# Patient Record
Sex: Male | Born: 1955 | Race: White | Hispanic: No | Marital: Single | Smoking: Former smoker
Health system: Southern US, Community
[De-identification: ages and names within clinical notes are randomized; demographics above are authoritative.]

## PROBLEM LIST (undated history)

## (undated) DIAGNOSIS — K219 Gastro-esophageal reflux disease without esophagitis: Secondary | ICD-10-CM

## (undated) DIAGNOSIS — E875 Hyperkalemia: Secondary | ICD-10-CM

## (undated) DIAGNOSIS — R911 Solitary pulmonary nodule: Secondary | ICD-10-CM

## (undated) DIAGNOSIS — J189 Pneumonia, unspecified organism: Secondary | ICD-10-CM

## (undated) DIAGNOSIS — I714 Abdominal aortic aneurysm, without rupture, unspecified: Secondary | ICD-10-CM

## (undated) DIAGNOSIS — I639 Cerebral infarction, unspecified: Secondary | ICD-10-CM

## (undated) DIAGNOSIS — M1990 Unspecified osteoarthritis, unspecified site: Secondary | ICD-10-CM

## (undated) DIAGNOSIS — K859 Acute pancreatitis without necrosis or infection, unspecified: Secondary | ICD-10-CM

## (undated) DIAGNOSIS — I472 Ventricular tachycardia: Secondary | ICD-10-CM

## (undated) DIAGNOSIS — J449 Chronic obstructive pulmonary disease, unspecified: Secondary | ICD-10-CM

## (undated) DIAGNOSIS — Z720 Tobacco use: Secondary | ICD-10-CM

## (undated) DIAGNOSIS — I5042 Chronic combined systolic (congestive) and diastolic (congestive) heart failure: Secondary | ICD-10-CM

## (undated) DIAGNOSIS — I739 Peripheral vascular disease, unspecified: Secondary | ICD-10-CM

## (undated) DIAGNOSIS — M069 Rheumatoid arthritis, unspecified: Secondary | ICD-10-CM

## (undated) DIAGNOSIS — I4729 Other ventricular tachycardia: Secondary | ICD-10-CM

## (undated) DIAGNOSIS — I2510 Atherosclerotic heart disease of native coronary artery without angina pectoris: Secondary | ICD-10-CM

## (undated) DIAGNOSIS — I219 Acute myocardial infarction, unspecified: Secondary | ICD-10-CM

## (undated) DIAGNOSIS — D649 Anemia, unspecified: Secondary | ICD-10-CM

## (undated) DIAGNOSIS — J849 Interstitial pulmonary disease, unspecified: Secondary | ICD-10-CM

## (undated) DIAGNOSIS — I779 Disorder of arteries and arterioles, unspecified: Secondary | ICD-10-CM

## (undated) DIAGNOSIS — N179 Acute kidney failure, unspecified: Secondary | ICD-10-CM

## (undated) HISTORY — DX: Atherosclerotic heart disease of native coronary artery without angina pectoris: I25.10

## (undated) HISTORY — DX: Chronic combined systolic (congestive) and diastolic (congestive) heart failure: I50.42

## (undated) HISTORY — DX: Gastro-esophageal reflux disease without esophagitis: K21.9

## (undated) HISTORY — DX: Acute pancreatitis without necrosis or infection, unspecified: K85.90

## (undated) HISTORY — DX: Abdominal aortic aneurysm, without rupture: I71.4

## (undated) HISTORY — DX: Ventricular tachycardia: I47.2

## (undated) HISTORY — DX: Interstitial pulmonary disease, unspecified: J84.9

## (undated) HISTORY — PX: OTHER SURGICAL HISTORY: SHX169

## (undated) HISTORY — DX: Hyperkalemia: E87.5

## (undated) HISTORY — PX: CAROTID ENDARTERECTOMY: SUR193

## (undated) HISTORY — DX: Rheumatoid arthritis, unspecified: M06.9

## (undated) HISTORY — DX: Solitary pulmonary nodule: R91.1

## (undated) HISTORY — DX: Other ventricular tachycardia: I47.29

## (undated) HISTORY — DX: Peripheral vascular disease, unspecified: I73.9

## (undated) HISTORY — DX: Acute kidney failure, unspecified: N17.9

## (undated) HISTORY — DX: Tobacco use: Z72.0

## (undated) HISTORY — DX: Unspecified osteoarthritis, unspecified site: M19.90

## (undated) HISTORY — DX: Disorder of arteries and arterioles, unspecified: I77.9

## (undated) HISTORY — PX: CARDIAC CATHETERIZATION: SHX172

## (undated) HISTORY — DX: Abdominal aortic aneurysm, without rupture, unspecified: I71.40

## (undated) HISTORY — DX: Anemia, unspecified: D64.9

---

## 1992-10-02 HISTORY — PX: OTHER SURGICAL HISTORY: SHX169

## 2000-09-03 ENCOUNTER — Encounter: Payer: Self-pay | Admitting: Cardiology

## 2000-09-03 ENCOUNTER — Encounter: Admission: RE | Admit: 2000-09-03 | Discharge: 2000-09-03 | Payer: Self-pay | Admitting: Cardiology

## 2002-02-03 ENCOUNTER — Encounter: Payer: Self-pay | Admitting: Emergency Medicine

## 2002-02-03 ENCOUNTER — Emergency Department (HOSPITAL_COMMUNITY): Admission: EM | Admit: 2002-02-03 | Discharge: 2002-02-03 | Payer: Self-pay | Admitting: Emergency Medicine

## 2004-05-27 ENCOUNTER — Encounter: Admission: RE | Admit: 2004-05-27 | Discharge: 2004-05-27 | Payer: Self-pay | Admitting: Cardiology

## 2004-05-29 ENCOUNTER — Ambulatory Visit (HOSPITAL_COMMUNITY): Admission: RE | Admit: 2004-05-29 | Discharge: 2004-05-29 | Payer: Self-pay | Admitting: Cardiology

## 2017-02-09 ENCOUNTER — Emergency Department (HOSPITAL_COMMUNITY): Payer: Medicaid Other

## 2017-02-09 ENCOUNTER — Emergency Department (HOSPITAL_COMMUNITY)
Admission: EM | Admit: 2017-02-09 | Discharge: 2017-02-09 | Disposition: A | Discharge status: Home / Self Care | Payer: Medicaid Other | Attending: Emergency Medicine | Admitting: Emergency Medicine

## 2017-02-09 ENCOUNTER — Emergency Department (HOSPITAL_BASED_OUTPATIENT_CLINIC_OR_DEPARTMENT_OTHER): Admit: 2017-02-09 | Discharge: 2017-02-09 | Disposition: A | Discharge status: Home / Self Care | Payer: Medicaid Other

## 2017-02-09 ENCOUNTER — Encounter (HOSPITAL_COMMUNITY): Payer: Self-pay

## 2017-02-09 DIAGNOSIS — J189 Pneumonia, unspecified organism: Secondary | ICD-10-CM | POA: Diagnosis not present

## 2017-02-09 DIAGNOSIS — I252 Old myocardial infarction: Secondary | ICD-10-CM | POA: Diagnosis not present

## 2017-02-09 DIAGNOSIS — Z7982 Long term (current) use of aspirin: Secondary | ICD-10-CM | POA: Diagnosis not present

## 2017-02-09 DIAGNOSIS — F17210 Nicotine dependence, cigarettes, uncomplicated: Secondary | ICD-10-CM | POA: Insufficient documentation

## 2017-02-09 DIAGNOSIS — M79609 Pain in unspecified limb: Secondary | ICD-10-CM

## 2017-02-09 DIAGNOSIS — M7989 Other specified soft tissue disorders: Secondary | ICD-10-CM

## 2017-02-09 DIAGNOSIS — Z8673 Personal history of transient ischemic attack (TIA), and cerebral infarction without residual deficits: Secondary | ICD-10-CM | POA: Insufficient documentation

## 2017-02-09 DIAGNOSIS — R5383 Other fatigue: Secondary | ICD-10-CM | POA: Diagnosis present

## 2017-02-09 HISTORY — DX: Acute myocardial infarction, unspecified: I21.9

## 2017-02-09 HISTORY — DX: Cerebral infarction, unspecified: I63.9

## 2017-02-09 LAB — CBC WITH DIFFERENTIAL/PLATELET
BASOS PCT: 0 %
Basophils Absolute: 0 10*3/uL (ref 0.0–0.1)
EOS PCT: 7 %
Eosinophils Absolute: 0.6 10*3/uL (ref 0.0–0.7)
HEMATOCRIT: 35.8 % — AB (ref 39.0–52.0)
Hemoglobin: 11.5 g/dL — ABNORMAL LOW (ref 13.0–17.0)
LYMPHS PCT: 27 %
Lymphs Abs: 2.3 10*3/uL (ref 0.7–4.0)
MCH: 26.5 pg (ref 26.0–34.0)
MCHC: 32.1 g/dL (ref 30.0–36.0)
MCV: 82.5 fL (ref 78.0–100.0)
MONO ABS: 0.5 10*3/uL (ref 0.1–1.0)
MONOS PCT: 6 %
NEUTROS ABS: 5.1 10*3/uL (ref 1.7–7.7)
Neutrophils Relative %: 60 %
PLATELETS: 270 10*3/uL (ref 150–400)
RBC: 4.34 MIL/uL (ref 4.22–5.81)
RDW: 16.7 % — AB (ref 11.5–15.5)
WBC: 8.5 10*3/uL (ref 4.0–10.5)

## 2017-02-09 LAB — COMPREHENSIVE METABOLIC PANEL
ALBUMIN: 3.1 g/dL — AB (ref 3.5–5.0)
ALK PHOS: 114 U/L (ref 38–126)
ALT: 10 U/L — AB (ref 17–63)
ANION GAP: 8 (ref 5–15)
AST: 16 U/L (ref 15–41)
BUN: 9 mg/dL (ref 6–20)
CALCIUM: 9.3 mg/dL (ref 8.9–10.3)
CO2: 25 mmol/L (ref 22–32)
Chloride: 105 mmol/L (ref 101–111)
Creatinine, Ser: 0.88 mg/dL (ref 0.61–1.24)
GFR calc Af Amer: >60 mL/min (ref >60)
GFR calc non Af Amer: >60 mL/min (ref >60)
GLUCOSE: 107 mg/dL — AB (ref 65–99)
Potassium: 4.4 mmol/L (ref 3.5–5.1)
SODIUM: 138 mmol/L (ref 135–145)
Total Bilirubin: 0.6 mg/dL (ref 0.3–1.2)
Total Protein: 7.6 g/dL (ref 6.5–8.1)

## 2017-02-09 LAB — I-STAT CG4 LACTIC ACID, ED: Lactic Acid, Venous: 1.35 mmol/L (ref 0.5–1.9)

## 2017-02-09 MED ORDER — DOXYCYCLINE HYCLATE 100 MG PO CAPS: 100.0000 mg | ORAL_CAPSULE | Freq: Two times a day (BID) | ORAL | 0 refills | Status: DC

## 2017-02-09 MED ORDER — TRAMADOL HCL 50 MG PO TABS: 50.0000 mg | ORAL_TABLET | Freq: Four times a day (QID) | ORAL | 0 refills | Status: DC | PRN

## 2017-02-09 NOTE — Progress Notes (Signed)
*  Preliminary Results* Left lower extremity venous duplex completed. Left lower extremity is negative for deep vein thrombosis. There is no evidence of left Baker's cyst. There is a heterogenous area of the left groin, suggestive of possible enlarged inguinal lymph node. There is a large hypoechoic area that extends from the distal popliteal fossa through to the mid calf; etiology is unknown. Further testing may be warranted if clinically indicated.  02/09/2017 2:45 PM  Gertie Fey, BS, RVT, RDCS, RDMS

## 2017-02-09 NOTE — Discharge Instructions (Signed)
Return here as needed.  Follow-up with the clinic provided.  °

## 2017-02-09 NOTE — ED Provider Notes (Signed)
[facility]PT Provider Note   CSN: [id number] Arrival date & time: 02/09/17  1011     History   Chief Complaint Chief Complaint  Patient presents with  . Generalized Body Aches    HPI LYFE MONGER is a 61 y.o. male.  HPI Patient presents to the emergency department with pain and both shoulders and pain in both knees has been ongoing since January.  The patient states that he has also had some cough as well.  Patient states that nothing seems make the condition better or worse.  He states he did not take any new medications prior to arrival other than the ones prescribed.  Patient states that he is due to have open heart surgery for several blocked vessels.  Patient states he is not having any chest pain or shortness of breath. The patient denies chest pain, shortness of breath, headache,blurred vision, neck pain, fever, cough, weakness, numbness, dizziness, anorexia, edema, abdominal pain, nausea, vomiting, diarrhea, rash,  dysuria, hematemesis, bloody stool, near syncope, or syncope. Past Medical History:  Diagnosis Date  . MI (myocardial infarction)    x 3   . Stroke [facility])     There are no active problems to display for this patient.   Past Surgical History:  Procedure Laterality Date  . CARDIAC CATHETERIZATION         Home Medications    Prior to Admission medications   Medication Sig Start Date End Date Taking? Authorizing Provider  acetaminophen (TYLENOL) 325 MG tablet Take 650 mg by mouth every 6 (six) hours as needed for mild pain.   Yes Historical Provider, MD  aspirin 81 MG chewable tablet Chew 81 mg by mouth daily.   Yes Historical Provider, MD  ibuprofen (ADVIL,MOTRIN) 200 MG tablet Take 200 mg by mouth every 6 (six) hours as needed for moderate pain.   Yes Historical Provider, MD  metoprolol tartrate (LOPRESSOR) 25 MG tablet Take 25 mg by mouth 2 (two) times daily.   Yes Historical Provider, MD    Family History No family history on  file.  Social History Social History  Substance Use Topics  . Smoking status: Current Some Day Smoker    Packs/day: 0.50    Types: Cigarettes  . Smokeless tobacco: Never Used  . Alcohol use No     Allergies   Patient has no known allergies.   Review of Systems Review of Systems All other systems negative except as documented in the HPI. All pertinent positives and negatives as reviewed in the HPI.   Physical Exam Updated Vital Signs BP (!) 144/84   Pulse 73   Temp 97.4 F (36.3 C) (Oral)   Resp 18   Ht 6\' 1"  (1.854 m)   Wt 75.8 kg   SpO2 99%   BMI 22.03 kg/m   Physical Exam  Constitutional: He is oriented to person, place, and time. He appears well-developed and well-nourished. No distress.  HENT:  Head: Normocephalic and atraumatic.  Mouth/Throat: Oropharynx is clear and moist.  Eyes: Pupils are equal, round, and reactive to light.  Neck: Normal range of motion. Neck supple.  Cardiovascular: Normal rate, regular rhythm and normal heart sounds.  Exam reveals no gallop and no friction rub.   No murmur heard. Pulmonary/Chest: Effort normal and breath sounds normal. No respiratory distress. He has no wheezes.  Abdominal: Soft. Bowel sounds are normal. He exhibits no distension. There is no tenderness.  Neurological: He is alert and oriented to person, place, and time. He  exhibits normal muscle tone. Coordination normal.  Skin: Skin is warm and dry. Capillary refill takes less than 2 seconds. No rash noted. No erythema.  Psychiatric: He has a normal mood and affect. His behavior is normal.  Nursing note and vitals reviewed.    ED Treatments / Results  Labs (all labs ordered are listed, but only abnormal results are displayed) Labs Reviewed  COMPREHENSIVE METABOLIC PANEL - Abnormal; Notable for the following:       Result Value   Glucose, Bld 107 (*)    Albumin 3.1 (*)    ALT 10 (*)    All other components within normal limits  CBC WITH DIFFERENTIAL/PLATELET  - Abnormal; Notable for the following:    Hemoglobin 11.5 (*)    HCT 35.8 (*)    RDW 16.7 (*)    All other components within normal limits  I-STAT CG4 LACTIC ACID, ED  I-STAT CG4 LACTIC ACID, ED    EKG  EKG Interpretation None       Radiology Dg Chest 2 View  Result Date: 02/09/2017 CLINICAL DATA:  61 year old male with cough and shortness of breath for the past month. Hypertension. Post CABG. Smoker. Initial encounter. EXAM: CHEST  2 VIEW COMPARISON:  05/27/2004. FINDINGS: Significant change since prior examination. Diffuse increased lung markings, possibly chronic although not able to exclude superimposed mild pulmonary vascular congestion. Given the significant change, lack of more recent films and patient's history smoking, chest CT with high-resolution imaging may be considered for further delineation. Fracture of the left first through fifth ribs new from prior exam although appearing chronic. No pneumothorax. Post CABG with fractured sternal wires.  Cardiomegaly. IMPRESSION: Significant change since prior examination. Diffuse increased lung markings, possibly chronic although not able to exclude superimposed mild pulmonary vascular congestion. Given the significant change, lack of more recent films and patient's history of smoking, chest CT with high-resolution imaging may be considered for further delineation. Fracture of the left first through fifth ribs new from prior exam although appearing chronic. Post CABG Cardiomegaly. Electronically Signed   By: Lacy Duverney M.D.   On: 02/09/2017 11:35    Procedures Procedures (including critical care time)  Medications Ordered in ED Medications - No data to display   Initial Impression / Assessment and Plan / ED Course  I have reviewed the triage vital signs and the nursing notes.  Pertinent labs & imaging results that were available during my care of the patient were reviewed by me and considered in my medical decision making (see  chart for details).     Patient is found to have what appears to be possible pneumonia.  I will treat with antibiotics.  He is also advised follow-up with his primary care doctor and given referral from care management for an appointment on the 17th.  Patient is advised that if he has any worsening in his condition, to return to the emergency department.  He agrees the plan and all questions were answered  Final Clinical Impressions(s) / ED Diagnoses   Final diagnoses:  None    New Prescriptions New Prescriptions   No medications on file     Charlestine Night, PA-C 02/13/17 0105    Canary Brim Tegeler, MD 02/16/17 [phone]

## 2017-02-09 NOTE — ED Notes (Signed)
Patient transported to Ultrasound 

## 2017-02-09 NOTE — ED Notes (Signed)
Pt stable, understands discharge instructions, and reasons for return.   

## 2017-02-09 NOTE — ED Triage Notes (Addendum)
Per Pt, Pt is coming from home with complaints of bilateral should and bilateral knee pain that has been intermittent for a couple months. Pt had three heart attacks and a stroke in January. Pt reports productive cough

## 2017-02-16 ENCOUNTER — Inpatient Hospital Stay (INDEPENDENT_AMBULATORY_CARE_PROVIDER_SITE_OTHER): Payer: Self-pay | Admitting: Physician Assistant

## 2017-02-19 ENCOUNTER — Encounter (HOSPITAL_COMMUNITY): Payer: Self-pay | Admitting: *Deleted

## 2017-02-19 ENCOUNTER — Emergency Department (HOSPITAL_COMMUNITY)
Admission: EM | Admit: 2017-02-19 | Discharge: 2017-02-19 | Disposition: A | Discharge status: Home / Self Care | Payer: Medicaid Other | Attending: Emergency Medicine | Admitting: Emergency Medicine

## 2017-02-19 ENCOUNTER — Emergency Department (HOSPITAL_COMMUNITY): Payer: Medicaid Other

## 2017-02-19 DIAGNOSIS — F17210 Nicotine dependence, cigarettes, uncomplicated: Secondary | ICD-10-CM | POA: Insufficient documentation

## 2017-02-19 DIAGNOSIS — Z720 Tobacco use: Secondary | ICD-10-CM

## 2017-02-19 DIAGNOSIS — R05 Cough: Secondary | ICD-10-CM | POA: Diagnosis present

## 2017-02-19 DIAGNOSIS — Z8673 Personal history of transient ischemic attack (TIA), and cerebral infarction without residual deficits: Secondary | ICD-10-CM | POA: Diagnosis not present

## 2017-02-19 DIAGNOSIS — I252 Old myocardial infarction: Secondary | ICD-10-CM | POA: Insufficient documentation

## 2017-02-19 DIAGNOSIS — J40 Bronchitis, not specified as acute or chronic: Secondary | ICD-10-CM | POA: Diagnosis not present

## 2017-02-19 DIAGNOSIS — Z7982 Long term (current) use of aspirin: Secondary | ICD-10-CM | POA: Insufficient documentation

## 2017-02-19 DIAGNOSIS — Z79899 Other long term (current) drug therapy: Secondary | ICD-10-CM | POA: Diagnosis not present

## 2017-02-19 LAB — CBC WITH DIFFERENTIAL/PLATELET
BASOS PCT: 0 %
Basophils Absolute: 0 10*3/uL (ref 0.0–0.1)
EOS ABS: 0.4 10*3/uL (ref 0.0–0.7)
EOS PCT: 7 %
HCT: 32.9 % — ABNORMAL LOW (ref 39.0–52.0)
Hemoglobin: 10.6 g/dL — ABNORMAL LOW (ref 13.0–17.0)
LYMPHS ABS: 2.1 10*3/uL (ref 0.7–4.0)
Lymphocytes Relative: 34 %
MCH: 26.2 pg (ref 26.0–34.0)
MCHC: 32.2 g/dL (ref 30.0–36.0)
MCV: 81.4 fL (ref 78.0–100.0)
MONOS PCT: 6 %
Monocytes Absolute: 0.4 10*3/uL (ref 0.1–1.0)
Neutro Abs: 3.2 10*3/uL (ref 1.7–7.7)
Neutrophils Relative %: 53 %
PLATELETS: 272 10*3/uL (ref 150–400)
RBC: 4.04 MIL/uL — ABNORMAL LOW (ref 4.22–5.81)
RDW: 16.5 % — AB (ref 11.5–15.5)
WBC: 6 10*3/uL (ref 4.0–10.5)

## 2017-02-19 LAB — URINALYSIS, ROUTINE W REFLEX MICROSCOPIC
BILIRUBIN URINE: NEGATIVE
Glucose, UA: NEGATIVE mg/dL
HGB URINE DIPSTICK: NEGATIVE
Ketones, ur: NEGATIVE mg/dL
Leukocytes, UA: NEGATIVE
NITRITE: NEGATIVE
Protein, ur: NEGATIVE mg/dL
SPECIFIC GRAVITY, URINE: 1.011 (ref 1.005–1.030)
pH: 5 (ref 5.0–8.0)

## 2017-02-19 LAB — COMPREHENSIVE METABOLIC PANEL
ALK PHOS: 111 U/L (ref 38–126)
ALT: 9 U/L — AB (ref 17–63)
AST: 21 U/L (ref 15–41)
Albumin: 3.3 g/dL — ABNORMAL LOW (ref 3.5–5.0)
Anion gap: 8 (ref 5–15)
BUN: 6 mg/dL (ref 6–20)
CALCIUM: 9.1 mg/dL (ref 8.9–10.3)
CHLORIDE: 107 mmol/L (ref 101–111)
CO2: 23 mmol/L (ref 22–32)
CREATININE: 0.92 mg/dL (ref 0.61–1.24)
GFR calc non Af Amer: >60 mL/min (ref >60)
Glucose, Bld: 91 mg/dL (ref 65–99)
Potassium: 4 mmol/L (ref 3.5–5.1)
SODIUM: 138 mmol/L (ref 135–145)
Total Bilirubin: 0.7 mg/dL (ref 0.3–1.2)
Total Protein: 8.3 g/dL — ABNORMAL HIGH (ref 6.5–8.1)

## 2017-02-19 LAB — I-STAT CG4 LACTIC ACID, ED: LACTIC ACID, VENOUS: 1.61 mmol/L (ref 0.5–1.9)

## 2017-02-19 MED ORDER — AEROCHAMBER Z-STAT PLUS/MEDIUM MISC
1.0000 | Freq: Once | Status: AC
  Administered 2017-02-19: 1
  Filled 2017-02-19: qty 1

## 2017-02-19 MED ORDER — PREDNISONE 20 MG PO TABS
60.0000 mg | ORAL_TABLET | Freq: Once | ORAL | Status: AC
  Administered 2017-02-19: 60 mg via ORAL

## 2017-02-19 MED ORDER — PREDNISONE 10 MG PO TABS: 20.0000 mg | ORAL_TABLET | Freq: Two times a day (BID) | ORAL | 0 refills | Status: DC

## 2017-02-19 MED ORDER — OXYCODONE-ACETAMINOPHEN 5-325 MG PO TABS
ORAL_TABLET | ORAL | Status: AC
  Administered 2017-02-19: 1 via ORAL
  Filled 2017-02-19: qty 1

## 2017-02-19 MED ORDER — ALBUTEROL SULFATE HFA 108 (90 BASE) MCG/ACT IN AERS
2.0000 | INHALATION_SPRAY | RESPIRATORY_TRACT | Status: DC | PRN
  Administered 2017-02-19: 2 via RESPIRATORY_TRACT
  Filled 2017-02-19: qty 6.7

## 2017-02-19 MED ORDER — OXYCODONE-ACETAMINOPHEN 5-325 MG PO TABS
1.0000 | ORAL_TABLET | Freq: Once | ORAL | Status: AC
  Administered 2017-02-19: 1 via ORAL

## 2017-02-19 NOTE — ED Notes (Signed)
Pt states, "I am trying to leave. I am hurting and can hurt at home." this RN encouraged the pt and family to stay for further eval, pt cooperative, this RN assessed pain & will communicate with the triage RN to address pts pain score

## 2017-02-19 NOTE — ED Notes (Signed)
Pt verbalized understanding of d/c instructions and has no further questions. Pt stable and NAD. VSS.  

## 2017-02-19 NOTE — Discharge Instructions (Signed)
Use the inhaler 2 puffs every 3-4 hours as needed for cough or trouble breathing.  Additionally for cough, use Robitussin-DM.  Try to avoid smoking.  Get plenty of rest and drink a lot of fluids.  Follow-up with a primary care doctor for ongoing management as soon as possible.

## 2017-02-19 NOTE — ED Triage Notes (Signed)
Pt states that he was seen 1 week ago  And told that he had pneumonia. Pt was given antibiotics but was unable to fill. Pt woke this morning with swelling to his left upper back and rt elbow. Decreased lung sounds on left side. Tenderness to palpation over swelling in back.

## 2017-02-19 NOTE — ED Notes (Signed)
Pt updated on wait time, pt encouraged to wait for room

## 2017-02-19 NOTE — ED Provider Notes (Signed)
[facility] Provider Note   CSN: [id number] Arrival date & time: 02/19/17  1251     History   Chief Complaint Chief Complaint  Patient presents with  . Cough    HPI [name] is a 61 y.o. male.  He presents for evaluation of persistent cough, despite taking 10 days of Augmentin for pneumonia.  He has mild shortness of breath which is worse with exertion.  He denies fever, chills, weakness or dizziness.  He continues to smoke cigarettes but is trying to cut back.  He does not currently have a primary care provider.  There are no other known modifying factors.  HPI  Past Medical History:  Diagnosis Date  . MI (myocardial infarction) (HCC)    x 3   . Stroke [facility])     There are no active problems to display for this patient.   Past Surgical History:  Procedure Laterality Date  . CARDIAC CATHETERIZATION         Home Medications    Prior to Admission medications   Medication Sig Start Date End Date Taking? Authorizing Provider  acetaminophen (TYLENOL) 325 MG tablet Take 650 mg by mouth every 6 (six) hours as needed for mild pain.    Historical Provider, MD  aspirin 81 MG chewable tablet Chew 81 mg by mouth daily.    Historical Provider, MD  doxycycline (VIBRAMYCIN) 100 MG capsule Take 1 capsule (100 mg total) by mouth 2 (two) times daily. 02/09/17   Charlestine Night, PA-C  ibuprofen (ADVIL,MOTRIN) 200 MG tablet Take 200 mg by mouth every 6 (six) hours as needed for moderate pain.    Historical Provider, MD  metoprolol tartrate (LOPRESSOR) 25 MG tablet Take 25 mg by mouth 2 (two) times daily.    Historical Provider, MD  predniSONE (DELTASONE) 10 MG tablet Take 2 tablets (20 mg total) by mouth 2 (two) times daily. 02/19/17   Mancel Bale, MD  traMADol (ULTRAM) 50 MG tablet Take 1 tablet (50 mg total) by mouth every 6 (six) hours as needed for severe pain. 02/09/17   Charlestine Night, PA-C    Family History No family history on file.  Social  History Social History  Substance Use Topics  . Smoking status: Current Some Day Smoker    Packs/day: 0.50    Types: Cigarettes  . Smokeless tobacco: Never Used  . Alcohol use No     Allergies   Patient has no known allergies.   Review of Systems Review of Systems  All other systems reviewed and are negative.    Physical Exam Updated Vital Signs BP 109/65 (BP Location: Right Arm)   Pulse (!) 134   Temp 97.6 F (36.4 C) (Oral)   Resp 20   SpO2 91%   Physical Exam  Constitutional: He is oriented to person, place, and time. He appears well-developed and well-nourished.  HENT:  Head: Normocephalic and atraumatic.  Right Ear: External ear normal.  Left Ear: External ear normal.  Eyes: Conjunctivae and EOM are normal. Pupils are equal, round, and reactive to light.  Neck: Normal range of motion and phonation normal. Neck supple.  Cardiovascular: Normal rate, regular rhythm and normal heart sounds.   Pulmonary/Chest: Effort normal. He exhibits no bony tenderness.  Few scattered rhonchi.  No increased work of breathing  Abdominal: Soft. There is no tenderness.  Musculoskeletal: Normal range of motion.  Neurological: He is alert and oriented to person, place, and time. No cranial nerve deficit or sensory deficit. He exhibits  normal muscle tone. Coordination normal.  Skin: Skin is warm, dry and intact.  Psychiatric: He has a normal mood and affect. His behavior is normal. Judgment and thought content normal.  Nursing note and vitals reviewed.    ED Treatments / Results  Labs (all labs ordered are listed, but only abnormal results are displayed) Labs Reviewed  COMPREHENSIVE METABOLIC PANEL - Abnormal; Notable for the following:       Result Value   Total Protein 8.3 (*)    Albumin 3.3 (*)    ALT 9 (*)    All other components within normal limits  CBC WITH DIFFERENTIAL/PLATELET - Abnormal; Notable for the following:    RBC 4.04 (*)    Hemoglobin 10.6 (*)    HCT 32.9  (*)    RDW 16.5 (*)    All other components within normal limits  URINALYSIS, ROUTINE W REFLEX MICROSCOPIC  I-STAT CG4 LACTIC ACID, ED  I-STAT CG4 LACTIC ACID, ED    EKG  EKG Interpretation None       Radiology No results found.  Procedures Procedures (including critical care time)  Medications Ordered in ED Medications  oxyCODONE-acetaminophen (PERCOCET/ROXICET) 5-325 MG per tablet 1 tablet (1 tablet Oral Given 02/19/17 1613)  aerochamber Z-Stat Plus/medium 1 each (1 each Other Given 02/19/17 1848)  predniSONE (DELTASONE) tablet 60 mg (60 mg Oral Given 02/19/17 1848)     Initial Impression / Assessment and Plan / ED Course  I have reviewed the triage vital signs and the nursing notes.  Pertinent labs & imaging results that were available during my care of the patient were reviewed by me and considered in my medical decision making (see chart for details).     Medications  oxyCODONE-acetaminophen (PERCOCET/ROXICET) 5-325 MG per tablet 1 tablet (1 tablet Oral Given 02/19/17 1613)  aerochamber Z-Stat Plus/medium 1 each (1 each Other Given 02/19/17 1848)  predniSONE (DELTASONE) tablet 60 mg (60 mg Oral Given 02/19/17 1848)    No data found.   At D/C Reevaluation with update and discussion. After initial assessment and treatment, an updated evaluation reveals he is comfortable and has no c/o. Discussed findings and answered questions. Ura Yingling L   Final Clinical Impressions(s) / ED Diagnoses   Final diagnoses:  Bronchitis  Tobacco abuse   Bronchitis without PNE, or SBI. Doubt metabolic instability.   Nursing Notes Reviewed/ Care Coordinated Applicable Imaging Reviewed Interpretation of Laboratory Data incorporated into ED treatment  The patient appears reasonably screened and/or stabilized for discharge and I doubt any other medical condition or other [facility] requiring further screening, evaluation, or treatment in the ED at this time prior to discharge.  Plan:  Home Medications-Albuterol inhaler q 3-4 hour prn,  continue usual; Home Treatments- rest, fluids, stop smoking; return here if the recommended treatment, does not improve the symptoms; Recommended follow up- PCP prn   New Prescriptions Discharge Medication List as of 02/19/2017  6:37 PM    START taking these medications   Details  predniSONE (DELTASONE) 10 MG tablet Take 2 tablets (20 mg total) by mouth 2 (two) times daily., Starting Fri 02/19/2017, Print         Mancel Bale, MD 02/22/17 [phone]

## 2017-02-24 ENCOUNTER — Encounter (HOSPITAL_COMMUNITY): Payer: Self-pay | Admitting: Emergency Medicine

## 2017-02-24 ENCOUNTER — Other Ambulatory Visit: Payer: Self-pay

## 2017-02-24 ENCOUNTER — Inpatient Hospital Stay (HOSPITAL_COMMUNITY)
Admission: EM | Admit: 2017-02-24 | Discharge: 2017-03-02 | DRG: 286 | Disposition: A | Discharge status: Home / Self Care | Payer: Medicaid Other | Attending: Internal Medicine | Admitting: Internal Medicine

## 2017-02-24 ENCOUNTER — Encounter (INDEPENDENT_AMBULATORY_CARE_PROVIDER_SITE_OTHER): Payer: Self-pay | Admitting: Physician Assistant

## 2017-02-24 ENCOUNTER — Ambulatory Visit (HOSPITAL_BASED_OUTPATIENT_CLINIC_OR_DEPARTMENT_OTHER): Payer: Medicaid Other | Admitting: *Deleted

## 2017-02-24 ENCOUNTER — Emergency Department (HOSPITAL_COMMUNITY): Payer: Medicaid Other

## 2017-02-24 ENCOUNTER — Ambulatory Visit (INDEPENDENT_AMBULATORY_CARE_PROVIDER_SITE_OTHER): Payer: Medicaid Other | Admitting: Physician Assistant

## 2017-02-24 VITALS — BP 122/82 | HR 89 | Temp 97.4°F | Ht 71.0 in | Wt 173.0 lb

## 2017-02-24 DIAGNOSIS — I10 Essential (primary) hypertension: Secondary | ICD-10-CM | POA: Diagnosis present

## 2017-02-24 DIAGNOSIS — I110 Hypertensive heart disease with heart failure: Secondary | ICD-10-CM | POA: Diagnosis present

## 2017-02-24 DIAGNOSIS — F17210 Nicotine dependence, cigarettes, uncomplicated: Secondary | ICD-10-CM | POA: Diagnosis present

## 2017-02-24 DIAGNOSIS — I252 Old myocardial infarction: Secondary | ICD-10-CM

## 2017-02-24 DIAGNOSIS — Z7982 Long term (current) use of aspirin: Secondary | ICD-10-CM | POA: Diagnosis not present

## 2017-02-24 DIAGNOSIS — F17200 Nicotine dependence, unspecified, uncomplicated: Secondary | ICD-10-CM | POA: Diagnosis present

## 2017-02-24 DIAGNOSIS — I714 Abdominal aortic aneurysm, without rupture, unspecified: Secondary | ICD-10-CM | POA: Diagnosis present

## 2017-02-24 DIAGNOSIS — I472 Ventricular tachycardia: Secondary | ICD-10-CM | POA: Diagnosis present

## 2017-02-24 DIAGNOSIS — R9431 Abnormal electrocardiogram [ECG] [EKG]: Secondary | ICD-10-CM | POA: Diagnosis not present

## 2017-02-24 DIAGNOSIS — J9621 Acute and chronic respiratory failure with hypoxia: Secondary | ICD-10-CM | POA: Diagnosis present

## 2017-02-24 DIAGNOSIS — I25708 Atherosclerosis of coronary artery bypass graft(s), unspecified, with other forms of angina pectoris: Secondary | ICD-10-CM | POA: Diagnosis present

## 2017-02-24 DIAGNOSIS — Z79899 Other long term (current) drug therapy: Secondary | ICD-10-CM | POA: Diagnosis not present

## 2017-02-24 DIAGNOSIS — R9439 Abnormal result of other cardiovascular function study: Secondary | ICD-10-CM | POA: Diagnosis not present

## 2017-02-24 DIAGNOSIS — J849 Interstitial pulmonary disease, unspecified: Secondary | ICD-10-CM | POA: Diagnosis not present

## 2017-02-24 DIAGNOSIS — R911 Solitary pulmonary nodule: Secondary | ICD-10-CM | POA: Diagnosis present

## 2017-02-24 DIAGNOSIS — Z8679 Personal history of other diseases of the circulatory system: Secondary | ICD-10-CM | POA: Diagnosis not present

## 2017-02-24 DIAGNOSIS — D638 Anemia in other chronic diseases classified elsewhere: Secondary | ICD-10-CM | POA: Diagnosis present

## 2017-02-24 DIAGNOSIS — R0600 Dyspnea, unspecified: Secondary | ICD-10-CM

## 2017-02-24 DIAGNOSIS — R0602 Shortness of breath: Secondary | ICD-10-CM | POA: Diagnosis not present

## 2017-02-24 DIAGNOSIS — J84112 Idiopathic pulmonary fibrosis: Secondary | ICD-10-CM | POA: Diagnosis present

## 2017-02-24 DIAGNOSIS — Z720 Tobacco use: Secondary | ICD-10-CM

## 2017-02-24 DIAGNOSIS — D649 Anemia, unspecified: Secondary | ICD-10-CM | POA: Diagnosis present

## 2017-02-24 DIAGNOSIS — I5043 Acute on chronic combined systolic (congestive) and diastolic (congestive) heart failure: Secondary | ICD-10-CM | POA: Diagnosis present

## 2017-02-24 DIAGNOSIS — E7800 Pure hypercholesterolemia, unspecified: Secondary | ICD-10-CM | POA: Diagnosis present

## 2017-02-24 DIAGNOSIS — I25118 Atherosclerotic heart disease of native coronary artery with other forms of angina pectoris: Secondary | ICD-10-CM | POA: Diagnosis present

## 2017-02-24 DIAGNOSIS — Z951 Presence of aortocoronary bypass graft: Secondary | ICD-10-CM | POA: Diagnosis not present

## 2017-02-24 DIAGNOSIS — R000 Tachycardia, unspecified: Secondary | ICD-10-CM

## 2017-02-24 DIAGNOSIS — J80 Acute respiratory distress syndrome: Secondary | ICD-10-CM | POA: Diagnosis not present

## 2017-02-24 DIAGNOSIS — I2510 Atherosclerotic heart disease of native coronary artery without angina pectoris: Secondary | ICD-10-CM | POA: Diagnosis present

## 2017-02-24 DIAGNOSIS — R079 Chest pain, unspecified: Secondary | ICD-10-CM | POA: Diagnosis not present

## 2017-02-24 DIAGNOSIS — I447 Left bundle-branch block, unspecified: Secondary | ICD-10-CM | POA: Diagnosis present

## 2017-02-24 DIAGNOSIS — I5023 Acute on chronic systolic (congestive) heart failure: Secondary | ICD-10-CM | POA: Diagnosis not present

## 2017-02-24 DIAGNOSIS — R0902 Hypoxemia: Secondary | ICD-10-CM

## 2017-02-24 DIAGNOSIS — J9811 Atelectasis: Secondary | ICD-10-CM | POA: Diagnosis present

## 2017-02-24 DIAGNOSIS — J9601 Acute respiratory failure with hypoxia: Secondary | ICD-10-CM | POA: Diagnosis not present

## 2017-02-24 DIAGNOSIS — I5041 Acute combined systolic (congestive) and diastolic (congestive) heart failure: Secondary | ICD-10-CM | POA: Diagnosis not present

## 2017-02-24 DIAGNOSIS — Z8673 Personal history of transient ischemic attack (TIA), and cerebral infarction without residual deficits: Secondary | ICD-10-CM | POA: Diagnosis not present

## 2017-02-24 DIAGNOSIS — I25110 Atherosclerotic heart disease of native coronary artery with unstable angina pectoris: Secondary | ICD-10-CM | POA: Diagnosis not present

## 2017-02-24 LAB — BASIC METABOLIC PANEL
Anion gap: 8 (ref 5–15)
BUN: 8 mg/dL (ref 6–20)
CHLORIDE: 105 mmol/L (ref 101–111)
CO2: 26 mmol/L (ref 22–32)
Calcium: 9.2 mg/dL (ref 8.9–10.3)
Creatinine, Ser: 1 mg/dL (ref 0.61–1.24)
GFR calc Af Amer: >60 mL/min (ref >60)
GFR calc non Af Amer: >60 mL/min (ref >60)
GLUCOSE: 95 mg/dL (ref 65–99)
Potassium: 4.3 mmol/L (ref 3.5–5.1)
Sodium: 139 mmol/L (ref 135–145)

## 2017-02-24 LAB — CBC WITH DIFFERENTIAL/PLATELET
Basophils Absolute: 0 10*3/uL (ref 0.0–0.1)
Basophils Relative: 0 %
Eosinophils Absolute: 0.4 10*3/uL (ref 0.0–0.7)
Eosinophils Relative: 5 %
HEMATOCRIT: 31.9 % — AB (ref 39.0–52.0)
HEMOGLOBIN: 10.3 g/dL — AB (ref 13.0–17.0)
LYMPHS ABS: 2.9 10*3/uL (ref 0.7–4.0)
LYMPHS PCT: 34 %
MCH: 26.3 pg (ref 26.0–34.0)
MCHC: 32.3 g/dL (ref 30.0–36.0)
MCV: 81.6 fL (ref 78.0–100.0)
Monocytes Absolute: 0.4 10*3/uL (ref 0.1–1.0)
Monocytes Relative: 5 %
NEUTROS PCT: 56 %
Neutro Abs: 4.9 10*3/uL (ref 1.7–7.7)
Platelets: 335 10*3/uL (ref 150–400)
RBC: 3.91 MIL/uL — AB (ref 4.22–5.81)
RDW: 16.9 % — ABNORMAL HIGH (ref 11.5–15.5)
WBC: 8.6 10*3/uL (ref 4.0–10.5)

## 2017-02-24 LAB — I-STAT TROPONIN, ED
TROPONIN I, POC: 0 ng/mL (ref 0.00–0.08)
Troponin i, poc: 0.02 ng/mL (ref 0.00–0.08)

## 2017-02-24 MED ORDER — SODIUM CHLORIDE 0.9 % IV BOLUS (SEPSIS): 500.0000 mL | Freq: Once | INTRAVENOUS | Status: DC

## 2017-02-24 MED ORDER — IPRATROPIUM BROMIDE 0.02 % IN SOLN
0.5000 mg | Freq: Once | RESPIRATORY_TRACT | Status: AC
  Administered 2017-02-24: 0.5 mg via RESPIRATORY_TRACT
  Filled 2017-02-24: qty 2.5

## 2017-02-24 MED ORDER — METHYLPREDNISOLONE SODIUM SUCC 125 MG IJ SOLR
125.0000 mg | Freq: Once | INTRAMUSCULAR | Status: AC
  Administered 2017-02-24: 125 mg via INTRAVENOUS
  Filled 2017-02-24: qty 2

## 2017-02-24 MED ORDER — HYDROCODONE-ACETAMINOPHEN 5-325 MG PO TABS
1.0000 | ORAL_TABLET | Freq: Once | ORAL | Status: AC
  Administered 2017-02-24: 1 via ORAL
  Filled 2017-02-24: qty 1

## 2017-02-24 MED ORDER — POLYETHYLENE GLYCOL 3350 17 G PO PACK: 17.0000 g | PACK | Freq: Every day | ORAL | Status: DC | PRN

## 2017-02-24 MED ORDER — SODIUM CHLORIDE 0.9% FLUSH
3.0000 mL | Freq: Two times a day (BID) | INTRAVENOUS | Status: DC
  Administered 2017-02-25 (×2): 3 mL via INTRAVENOUS

## 2017-02-24 MED ORDER — BISACODYL 5 MG PO TBEC
5.0000 mg | DELAYED_RELEASE_TABLET | Freq: Every day | ORAL | Status: DC | PRN
  Filled 2017-02-24: qty 1

## 2017-02-24 MED ORDER — ALBUTEROL SULFATE (2.5 MG/3ML) 0.083% IN NEBU
5.0000 mg | INHALATION_SOLUTION | Freq: Once | RESPIRATORY_TRACT | Status: AC
  Administered 2017-02-24: 5 mg via RESPIRATORY_TRACT
  Filled 2017-02-24: qty 6

## 2017-02-24 MED ORDER — HYDROCODONE-ACETAMINOPHEN 5-325 MG PO TABS
1.0000 | ORAL_TABLET | ORAL | Status: DC | PRN
  Administered 2017-02-25: 1 via ORAL
  Administered 2017-02-26: 2 via ORAL
  Administered 2017-02-27: 1 via ORAL
  Administered 2017-02-27 – 2017-02-28 (×3): 2 via ORAL
  Filled 2017-02-24 (×4): qty 2
  Filled 2017-02-24: qty 1
  Filled 2017-02-24: qty 2

## 2017-02-24 MED ORDER — ACETAMINOPHEN 325 MG PO TABS: 650.0000 mg | ORAL_TABLET | Freq: Four times a day (QID) | ORAL | Status: DC | PRN

## 2017-02-24 MED ORDER — IBUPROFEN 400 MG PO TABS: 400.0000 mg | ORAL_TABLET | Freq: Once | ORAL | Status: DC

## 2017-02-24 MED ORDER — ONDANSETRON HCL 4 MG/2ML IJ SOLN
4.0000 mg | Freq: Four times a day (QID) | INTRAMUSCULAR | Status: DC | PRN
Start: 2017-02-24 — End: 2017-03-02

## 2017-02-24 MED ORDER — ENOXAPARIN SODIUM 40 MG/0.4ML ~~LOC~~ SOLN
40.0000 mg | SUBCUTANEOUS | Status: DC
  Administered 2017-02-25 – 2017-02-28 (×4): 40 mg via SUBCUTANEOUS
  Filled 2017-02-24 (×4): qty 0.4

## 2017-02-24 MED ORDER — SODIUM CHLORIDE 0.9% FLUSH: 3.0000 mL | INTRAVENOUS | Status: DC | PRN

## 2017-02-24 MED ORDER — SODIUM CHLORIDE 0.9 % IV SOLN: 250.0000 mL | INTRAVENOUS | Status: DC | PRN

## 2017-02-24 MED ORDER — IPRATROPIUM-ALBUTEROL 0.5-2.5 (3) MG/3ML IN SOLN: 3.0000 mL | RESPIRATORY_TRACT | Status: DC | PRN

## 2017-02-24 MED ORDER — METHYLPREDNISOLONE SODIUM SUCC 125 MG IJ SOLR
60.0000 mg | Freq: Three times a day (TID) | INTRAMUSCULAR | Status: DC
  Administered 2017-02-25: 60 mg via INTRAVENOUS
  Filled 2017-02-24: qty 2

## 2017-02-24 MED ORDER — SODIUM CHLORIDE 0.9% FLUSH
3.0000 mL | Freq: Two times a day (BID) | INTRAVENOUS | Status: DC
  Administered 2017-02-26 – 2017-03-01 (×7): 3 mL via INTRAVENOUS

## 2017-02-24 MED ORDER — ONDANSETRON HCL 4 MG PO TABS: 4.0000 mg | ORAL_TABLET | Freq: Four times a day (QID) | ORAL | Status: DC | PRN

## 2017-02-24 MED ORDER — ACETAMINOPHEN 650 MG RE SUPP: 650.0000 mg | Freq: Four times a day (QID) | RECTAL | Status: DC | PRN

## 2017-02-24 NOTE — Progress Notes (Signed)
Pt arrived for EKG per Sindy Messing

## 2017-02-24 NOTE — ED Triage Notes (Signed)
Pt states he visit his PCP for follow up after dc from hospital and PCP encouraged him to follow up in abnormal EKG, pt denies any pain or SOB at this time.

## 2017-02-24 NOTE — Progress Notes (Signed)
Subjective:  Patient ID: [name], male    DOB: [birth year]  Age: 61 y.o. MRN: [id number]  CC: ED f/u for bronchitis  HPI [name] is a 61 y.o. male with a PMH of MI x3, quadruple bypass surgery 1993, Carotid CEA or CAS?, Stroke, abdominal aneurysm, and tobacco abuse. Was in the ED on 02/09/17, diagnosis CAP, and on 02/19/17 diagnosis Bronchitis. Said he had chest pressure once on 02/17/17 but has not had chest pressure since. Chest pain and chest pressure is not a normal occurrence. He has been smoking for a long time and knows the cardiovascular risks associated with smoking. He had also been found to have an abdominal aneurysm but does not know the size. His only complaint today is generalized body aches. He was found to be anemic, Hgb 10.6 on 02/19/17. Does not have any other complaints.     Outpatient Medications Prior to Visit  Medication Sig Dispense Refill  . ibuprofen (ADVIL,MOTRIN) 200 MG tablet Take 200 mg by mouth every 6 (six) hours as needed for moderate pain.    . metoprolol tartrate (LOPRESSOR) 25 MG tablet Take 25 mg by mouth 2 (two) times daily.    . predniSONE (DELTASONE) 10 MG tablet Take 2 tablets (20 mg total) by mouth 2 (two) times daily. 10 tablet 0  . doxycycline (VIBRAMYCIN) 100 MG capsule Take 1 capsule (100 mg total) by mouth 2 (two) times daily. 20 capsule 0  . acetaminophen (TYLENOL) 325 MG tablet Take 650 mg by mouth every 6 (six) hours as needed for mild pain.    [name] aspirin 81 MG chewable tablet Chew 81 mg by mouth daily.    . traMADol (ULTRAM) 50 MG tablet Take 1 tablet (50 mg total) by mouth every 6 (six) hours as needed for severe pain. (Patient not taking: Reported on 02/24/2017) 15 tablet 0   No facility-administered medications prior to visit.      ROS Review of Systems  Constitutional: Negative for chills, fever and malaise/fatigue.  Eyes: Negative for blurred vision.  Respiratory: Negative for shortness of breath.   Cardiovascular:  Negative for chest pain and palpitations.  Gastrointestinal: Negative for abdominal pain and nausea.  Genitourinary: Negative for dysuria and hematuria.  Musculoskeletal: Positive for back pain, joint pain, myalgias and neck pain.  Skin: Negative for rash.  Neurological: Negative for tingling and headaches.  Psychiatric/Behavioral: Negative for depression. The patient is not nervous/anxious.     Objective:  BP 122/82 (BP Location: Left Arm, Patient Position: Sitting, Cuff Size: Normal)   Pulse 89   Temp 97.4 F (36.3 C) (Oral)   Ht 5\' 11"  (1.803 m)   Wt 173 lb (78.5 kg)   SpO2 97%   BMI 24.13 kg/m   BP/Weight 02/24/2017 02/19/2017 02/09/2017  Systolic BP 122 109 126  Diastolic BP 82 65 82  Wt. (Lbs) 173 - 167  BMI 24.13 - 22.03      Physical Exam  Constitutional: He is oriented to person, place, and time.  Well developed, thin, NAD, polite  HENT:  Head: Normocephalic and atraumatic.  Eyes: No scleral icterus.  Neck: Normal range of motion. Neck supple. No thyromegaly present.  Cardiovascular: Normal rate, regular rhythm and normal heart sounds.   Pulmonary/Chest: Effort normal. No respiratory distress. He has no wheezes. He exhibits no tenderness.  Rhonchi throughout.  Abdominal: Soft. Bowel sounds are normal. He exhibits no distension. There is no tenderness.  Unable to appreciate size of aorta.  Musculoskeletal: He exhibits  no edema.  Neurological: He is alert and oriented to person, place, and time. No cranial nerve deficit. Coordination abnormal.  Ambulates with assistance from cane. Mild to moderate weakness of the left UE and LE  Skin: Skin is warm and dry. No rash noted. No erythema. No pallor.  Psychiatric: He has a normal mood and affect. His behavior is normal. Thought content normal.  Vitals reviewed.    Assessment & Plan:   1. History of MI (myocardial infarction) - Ambulatory referral to Cardiology - Comprehensive metabolic panel - Lipid panel - EKG  12-Lead, sent to [facility] due to inoperable EKG here in clinic. - EKG reading received. I called patient and notified him to go to ED as a precaution due to abnormal EKG showing signs of ischemia. Patient understood and will go to ED today.   2. History of abdominal aortic aneurysm - VAS [place] AAA DUPLEX; Future  3. Tobacco abuse - Chantix use discussed but he will have to discuss with cardiologist due to increased cardiovascular events with Chantix use. Renal clearance will also be assessed. - May likely need to discuss alternative treatments for smoking cessation. - CBC with Differential - Comprehensive metabolic panel - VAS [place] AAA DUPLEX; Future  4. Anemia, unspecified type  - Hgb 10.6 on 02/19/17. - CBC   Follow-up: Return in about 4 weeks (around 03/24/2017) for f/u anemia.   [name] PA

## 2017-02-24 NOTE — Patient Instructions (Signed)

## 2017-02-24 NOTE — ED Notes (Signed)
Pt ambulated around the nursing station pox dropped to 85% on Room air and heartrate was 118bpm pt denies any shortness of breath or any other issues

## 2017-02-24 NOTE — ED Provider Notes (Signed)
[facility]-EMERGENCY DEPT Provider Note   CSN: [id number] Arrival date & time: 02/24/17  1907     History   Chief Complaint Chief Complaint  Patient presents with  . Abnormal ECG    follow up per PCP    HPI [name] is a 61 y.o. male.  Patient with h/o CABG, CVA in 1/18 admitted to [facility] for a month where he had heart cath for MI (no new intervention) and endarterectomy? -- based on history from patient and his sister. Has been seen in ED 4/10 and 4/20 and treated for PNA (course of amox) and then bronchitis. Saw PCP for f/u today and was sent back given abnormal findings on EKG. Pt is asymptomatic from a cardiac standpoint currently. Reports one episode of chest pressure last week lasting 'half the day'. No current chest pain or pressure, shortness of breath, lightheadedness, palpitations. He does report becoming short of breath quickly with exertion however this is not new. Plan with PCP otherwise is for cardiology follow-up in the near future. CT from 4/10 showed pulmonary edema versus fibrosis. Unable to obtain records from [facility] through care everywhere.      Past Medical History:  Diagnosis Date  . MI (myocardial infarction) (HCC)    x 3   . Stroke [facility])     There are no active problems to display for this patient.   Past Surgical History:  Procedure Laterality Date  . CARDIAC CATHETERIZATION         Home Medications    Prior to Admission medications   Medication Sig Start Date End Date Taking? Authorizing Provider  acetaminophen (TYLENOL) 325 MG tablet Take 650 mg by mouth every 6 (six) hours as needed for mild pain.    Historical Provider, MD  aspirin 81 MG chewable tablet Chew 81 mg by mouth daily.    Historical Provider, MD  metoprolol tartrate (LOPRESSOR) 25 MG tablet Take 25 mg by mouth 2 (two) times daily.    Historical Provider, MD  predniSONE (DELTASONE) 10 MG tablet Take 2 tablets (20 mg total) by mouth 2 (two) times daily. 02/19/17    Mancel Bale, MD    Family History No family history on file.  Social History Social History  Substance Use Topics  . Smoking status: Current Some Day Smoker    Packs/day: 0.50    Types: Cigarettes  . Smokeless tobacco: Never Used  . Alcohol use No     Allergies   Patient has no known allergies.   Review of Systems Review of Systems  Constitutional: Positive for unexpected weight change (over several months). Negative for diaphoresis and fever.  Eyes: Negative for redness.  Respiratory: Positive for shortness of breath. Negative for cough.   Cardiovascular: Negative for chest pain, palpitations and leg swelling.  Gastrointestinal: Negative for abdominal pain, nausea and vomiting.  Genitourinary: Negative for dysuria.  Musculoskeletal: Negative for back pain and neck pain.  Skin: Negative for rash.  Neurological: Negative for syncope and light-headedness.  Psychiatric/Behavioral: The patient is not nervous/anxious.      Physical Exam Updated Vital Signs BP (!) 141/86   Pulse 99   Temp 98 F (36.7 C) (Oral)   Resp (!) 27   Ht 5\' 11"  (1.803 m)   Wt 78.5 kg   SpO2 97%   BMI 24.13 kg/m   Physical Exam  Constitutional: He appears well-developed and well-nourished.  HENT:  Head: Normocephalic and atraumatic.  Mouth/Throat: Oropharynx is clear and moist.  Eyes: Conjunctivae  are normal. Right eye exhibits no discharge. Left eye exhibits no discharge.  Neck: Normal range of motion. Neck supple.  Cardiovascular: Normal rate, regular rhythm and normal heart sounds.   Pulmonary/Chest: Effort normal. No respiratory distress. He has no wheezes. He has rales (throughout).  Abdominal: Soft. There is no tenderness.  Neurological: He is alert.  Skin: Skin is warm and dry.  Psychiatric: He has a normal mood and affect.  Nursing note and vitals reviewed.    ED Treatments / Results  Labs (all labs ordered are listed, but only abnormal results are displayed) Labs  Reviewed  CBC WITH DIFFERENTIAL/PLATELET - Abnormal; Notable for the following:       Result Value   RBC 3.91 (*)    Hemoglobin 10.3 (*)    HCT 31.9 (*)    RDW 16.9 (*)    All other components within normal limits  BASIC METABOLIC PANEL  BRAIN NATRIURETIC PEPTIDE  I-STAT TROPOININ, ED  Rosezena Sensor, ED    ED ECG REPORT   Date: 02/24/2017  Rate: 117  Rhythm: sinus tachycardia  QRS Axis: right  Intervals: normal  ST/T Wave abnormalities: nonspecific ST/T changes  Conduction Disutrbances:right bundle branch block  Narrative Interpretation:   Old EKG Reviewed: changes noted from earlier today -- resolution of anterior t-wave inversions  I have personally reviewed the EKG tracing and agree with the computerized printout as noted.  Radiology Dg Chest 2 View  Result Date: 02/24/2017 CLINICAL DATA:  Abnormal EKG. Follow-up chest radiograph requested. Initial encounter. EXAM: CHEST  2 VIEW COMPARISON:  Chest radiograph performed 02/19/2017 FINDINGS: The lungs are well-aerated. Peripheral scarring is noted bilaterally, with diffusely increased interstitial markings, concerning for worsening interstitial lung disease. There is no evidence of pleural effusion or pneumothorax. The heart is borderline enlarged. The patient is status post median sternotomy. There are fractures of the superior most sternal wires. No acute osseous abnormalities are seen. IMPRESSION: 1. Peripheral scarring, with diffusely increased interstitial markings, concerning for worsening interstitial lung disease. Underlying pneumonia cannot be excluded. High-resolution CT is recommended to assess for interstitial lung disease, when and as deemed clinically appropriate. 2. Borderline cardiomegaly. Electronically Signed   By: Roanna Raider M.D.   On: 02/24/2017 19:40    Procedures Procedures (including critical care time)  Medications Ordered in ED Medications  albuterol (PROVENTIL) (2.5 MG/3ML) 0.083% nebulizer  solution 5 mg (not administered)  ipratropium (ATROVENT) nebulizer solution 0.5 mg (not administered)  HYDROcodone-acetaminophen (NORCO/VICODIN) 5-325 MG per tablet 1 tablet (1 tablet Oral Given 02/24/17 2330)  methylPREDNISolone sodium succinate (SOLU-MEDROL) 125 mg/2 mL injection 125 mg (125 mg Intravenous Given 02/24/17 2330)     Initial Impression / Assessment and Plan / ED Course  I have reviewed the triage vital signs and the nursing notes.  Pertinent labs & imaging results that were available during my care of the patient were reviewed by me and considered in my medical decision making (see chart for details).     Patient seen and examined. Work-up initiated.   Vital signs reviewed and are as follows: BP (!) 141/86   Pulse 99   Temp 98 F (36.7 C) (Oral)   Resp (!) 27   Ht 5\' 11"  (1.803 m)   Wt 78.5 kg   SpO2 97%   BMI 24.13 kg/m   Pt discussed with and seen by Dr. Verdie Mosher. Patient ambulated and pulse ox quickly decreases to 85%. No chest pain. Will admit given hypoxia with exertion, dynamic EKG changes however without  any active chest pain or anginal equivalent.   11:31 PM Spoke with Dr. Antionette Char who will see.    Final Clinical Impressions(s) / ED Diagnoses   Final diagnoses:  Abnormal EKG  Exertional shortness of breath  Hypoxia   Patient sent to the emergency department with EKG changes, no active chest pain, shortness of breath with exertion. EKG here shows resolution of anterior T-wave changes.  Patient has hypoxia and exertional shortness of breath. He desats down to 85% with ambulation. Abnormal lung sounds with possibility of pulmonary fibrosis versus edema on recent x-rays and CT scans.    New Prescriptions New Prescriptions   No medications on file     Renne Crigler, PA-C 02/24/17 2332    Lavera Guise, MD 02/25/17 1051

## 2017-02-24 NOTE — H&P (Signed)
History and Physical    DENSIL OTTEY JXB:[id number] DOB: January 24, 1956 DOA: 02/24/2017  PCP: [name], PA-C   Patient coming from: Home, by way of PCP office   Chief Complaint: Dyspnea, productive cough, EKG abnormalities   HPI: [name] is a 61 y.o. male with medical history significant for coronary artery disease status post remote CABG, history of CVA, carotid artery stenosis status post recent intervention on the right carotid, and interstitial lung disease who presents to the emergency department at the direction of his PCP for evaluation of EKG abnormalities and progressive dyspnea and productive cough. Patient had been seen in the emergency department twice already this month for respiratory issues and was initially treated with 10 days of Augmentin, but failed to have any improvement, and was then treated with bronchodilators and prednisone. He was evaluated today by his new PCP for his respiratory problems, was noted to be dyspneic with minimal exertion, and had an EKG with abnormalities concerning for ischemia. He was directed to the ED for further evaluation of this. Patient reports experiencing chest pain on 02/17/2017 which lasted over an hour and was described as a severe pressure sensation. He has not experienced any chest pain since then. He also reports an admission to [facility] in February of this year, and underwent an intervention on his right carotid for a high-grade stenosis and reports being told by Dr. Adella Hare of cardiology at that time that occlusions were noted in his coronary bypass grafts and one of his native coronaries. Patient reports that he has never recovered from the acute worsening in his respiratory status for which she was initially diagnosed with pneumonia a couple weeks ago, noting that he continues to have a productive cough and dyspnea with minimal exertion.   ED Course: Upon arrival to the ED, patient is found to be afebrile,  saturating well on room air, and with vitals otherwise stable. With ambulation just a few steps, patient was noted to be in acute respiratory distress with tachypnea, saturations in the low to mid 80s, and tachycardia to 120. There was no chest pain associated with that. His EKG reveals a sinus rhythm with inferolateral T-wave inversions and incomplete left bundle branch block. Chest x-ray is notable for peripheral scarring with diffusely increased interstitial markings concerning for worsening in his interstitial lung disease chemistry panel is unremarkable and CBC is notable for a normocytic anemia with hemoglobin of 10.3. Troponin is undetectable, then within the normal limits at 0.02 couple hours later. Patient has remained hemodynamically stable and not in acute distress while at rest, but becomes tachycardic and hypoxic with minimal exertion and will be admitted to the telemetry unit for ongoing evaluation and management of this.  Review of Systems:  All other systems reviewed and apart from HPI, are negative.  Past Medical History:  Diagnosis Date  . MI (myocardial infarction) (HCC)    x 3   . Stroke [facility])     Past Surgical History:  Procedure Laterality Date  . CARDIAC CATHETERIZATION       reports that he has been smoking Cigarettes.  He has been smoking about 0.50 packs per day. He has never used smokeless tobacco. He reports that he does not drink alcohol or use drugs.  No Known Allergies  History reviewed. No pertinent family history.   Prior to Admission medications   Medication Sig Start Date End Date Taking? Authorizing Provider  acetaminophen (TYLENOL) 325 MG tablet Take 650 mg by mouth  every 6 (six) hours as needed for mild pain.    Historical Provider, MD  aspirin 81 MG chewable tablet Chew 81 mg by mouth daily.    Historical Provider, MD  metoprolol tartrate (LOPRESSOR) 25 MG tablet Take 25 mg by mouth 2 (two) times daily.    Historical Provider, MD  predniSONE  (DELTASONE) 10 MG tablet Take 2 tablets (20 mg total) by mouth 2 (two) times daily. 02/19/17   Mancel Bale, MD    Physical Exam: Vitals:   02/24/17 2215 02/24/17 2332 02/24/17 2333 02/24/17 2334  BP: 137/83 (!) 118/97    Pulse: (!) 110  (!) 106 (!) 102  Resp: (!) 26   (!) 22  Temp:      TempSrc:      SpO2: 98%  96% 94%  Weight:      Height:          Constitutional: NAD, calm, comfortable Eyes: PERTLA, lids and conjunctivae normal ENMT: Mucous membranes are moist. Posterior pharynx clear of any exudate or lesions.   Neck: normal, supple, no masses, no thyromegaly Respiratory: Mildly diminished with fine rales throughout bilateral lung fields. Normal respiratory effort. No accessory muscle use.  Cardiovascular: S1 & S2 heard, regular rate and rhythm. No extremity edema. No significant JVD. Abdomen: No distension, no tenderness, no masses palpated. Bowel sounds normal.  Musculoskeletal: no clubbing / cyanosis. No joint deformity upper and lower extremities.    Skin: no significant rashes, lesions, ulcers. Warm, dry, well-perfused. Neurologic: CN 2-12 grossly intact. Sensation intact, DTR normal. Strength 5/5 in all 4 limbs.  Psychiatric: Alert and oriented x 3. Normal mood and affect.     Labs on Admission: I have personally reviewed following labs and imaging studies  CBC:  Recent Labs Lab 02/19/17 1342 02/24/17 1920  WBC 6.0 8.6  NEUTROABS 3.2 4.9  HGB 10.6* 10.3*  HCT 32.9* 31.9*  MCV 81.4 81.6  PLT 272 335   Basic Metabolic Panel:  Recent Labs Lab 02/19/17 1342 02/24/17 1920  NA 138 139  K 4.0 4.3  CL 107 105  CO2 23 26  GLUCOSE 91 95  BUN 6 8  CREATININE 0.92 1.00  CALCIUM 9.1 9.2   GFR: Estimated Creatinine Clearance: 82.6 mL/min (by C-G formula based on SCr of 1 mg/dL). Liver Function Tests:  Recent Labs Lab 02/19/17 1342  AST 21  ALT 9*  ALKPHOS 111  BILITOT 0.7  PROT 8.3*  ALBUMIN 3.3*   No results for input(s): LIPASE, AMYLASE in the  last 168 hours. No results for input(s): AMMONIA in the last 168 hours. Coagulation Profile: No results for input(s): INR, PROTIME in the last 168 hours. Cardiac Enzymes: No results for input(s): CKTOTAL, CKMB, CKMBINDEX, TROPONINI in the last 168 hours. BNP (last 3 results) No results for input(s): PROBNP in the last 8760 hours. HbA1C: No results for input(s): HGBA1C in the last 72 hours. CBG: No results for input(s): GLUCAP in the last 168 hours. Lipid Profile: No results for input(s): CHOL, HDL, LDLCALC, TRIG, CHOLHDL, LDLDIRECT in the last 72 hours. Thyroid Function Tests: No results for input(s): TSH, T4TOTAL, FREET4, T3FREE, THYROIDAB in the last 72 hours. Anemia Panel: No results for input(s): VITAMINB12, FOLATE, FERRITIN, TIBC, IRON, RETICCTPCT in the last 72 hours. Urine analysis:    Component Value Date/Time   COLORURINE YELLOW 02/19/2017 1342   APPEARANCEUR CLEAR 02/19/2017 1342   LABSPEC 1.011 02/19/2017 1342   PHURINE 5.0 02/19/2017 1342   GLUCOSEU NEGATIVE 02/19/2017 1342  HGBUR NEGATIVE 02/19/2017 1342   BILIRUBINUR NEGATIVE 02/19/2017 1342   KETONESUR NEGATIVE 02/19/2017 1342   PROTEINUR NEGATIVE 02/19/2017 1342   NITRITE NEGATIVE 02/19/2017 1342   LEUKOCYTESUR NEGATIVE 02/19/2017 1342   Sepsis Labs: @LABRCNTIP (procalcitonin:4,lacticidven:4) )No results found for this or any previous visit (from the past 240 hour(s)).   Radiological Exams on Admission: Dg Chest 2 View  Result Date: 02/24/2017 CLINICAL DATA:  Abnormal EKG. Follow-up chest radiograph requested. Initial encounter. EXAM: CHEST  2 VIEW COMPARISON:  Chest radiograph performed 02/19/2017 FINDINGS: The lungs are well-aerated. Peripheral scarring is noted bilaterally, with diffusely increased interstitial markings, concerning for worsening interstitial lung disease. There is no evidence of pleural effusion or pneumothorax. The heart is borderline enlarged. The patient is status post median sternotomy.  There are fractures of the superior most sternal wires. No acute osseous abnormalities are seen. IMPRESSION: 1. Peripheral scarring, with diffusely increased interstitial markings, concerning for worsening interstitial lung disease. Underlying pneumonia cannot be excluded. High-resolution CT is recommended to assess for interstitial lung disease, when and as deemed clinically appropriate. 2. Borderline cardiomegaly. Electronically Signed   By: 02/21/2017 M.D.   On: 02/24/2017 19:40    EKG: Independently reviewed. Sinus rhythm, incomplete LBBB, inferolateral T-wave inversions.   Assessment/Plan  1. Interstitial lung disease, acute hypoxic respiratory failure  - Pt presents from PCP office with ongoing productive cough, exertional dyspnea, and EKG abnormalities - He was seen in ED twice already this month for respiratory problems and completed a 10-day course of Augmentin for suspected PNA, then a course of prednisone with bronchodilators for suspected bronchitis  - He reports no improvement in respiratory status following abx and steroids, noted to become very symptomatic with minimal exertion, manifest by tachycardia into 120's and hypoxia to low 80s - CXR features increased diffuse interstitial markings that raise concern for interstitial lung disease  - There is no apparent hypervolemia or infectious process  - Likely secondary to progressive interstitial lung disease and will treat acutely with steroid and nebs; likely needs non-emergent high-resolution chest CT  - Given the tachycardia and hypoxia with recent carotid artery surgery and EKG abnormalities, PE should be excluded, and per Wells, et al, this will require CTA chest    2. Coronary artery disease, EKG abnormalities  - No anginal complaint today  - Hx of 4-vessel CABG in 1993  - Reports admission to [facility] in Feb '18, was seen by Dr. 09-02-1996 of cardiology, and told he has blockages in some grafts and a native coronary  - His PCP  has referred him to cardiology, unclear who  - EKG abnormalities described above, no priors available, troponin wnl x2  - Continue Lopressor and ASA 81   3. Hx carotid stenosis with CVA  - Pt reports being admitted to [facility] in Feb '18 for CVA, and underwent intervention on right carotid during that admission  - No focal deficits identified, surgical site appears well-healed    4. Hypertension  - BP is at goal  - Continue Lopressor   5. Normocytic anemia  - Hgb is 10.3 on admission, down 1 gram since earlier this month   - Pt denies melena or hematochezia, no pallor  - Anemia panel ordered    DVT prophylaxis: sq Lovenox  Code Status: Full  Family Communication: Discussed with patient Disposition Plan: Admit to telemetry Consults called: None Admission status: Inpatient    09-02-1996, MD [facility] Pager [phone]  If 7PM-7AM, please contact night-coverage [email or web address] Password [email or web address]  02/24/2017, 11:48 PM

## 2017-02-25 ENCOUNTER — Inpatient Hospital Stay (HOSPITAL_COMMUNITY): Payer: Medicaid Other

## 2017-02-25 DIAGNOSIS — R0602 Shortness of breath: Secondary | ICD-10-CM

## 2017-02-25 DIAGNOSIS — R911 Solitary pulmonary nodule: Secondary | ICD-10-CM

## 2017-02-25 DIAGNOSIS — J849 Interstitial pulmonary disease, unspecified: Secondary | ICD-10-CM

## 2017-02-25 LAB — LIPID PANEL
CHOLESTEROL TOTAL: 161 mg/dL (ref 100–199)
Chol/HDL Ratio: 6.2 ratio — ABNORMAL HIGH (ref 0.0–5.0)
HDL: 26 mg/dL — ABNORMAL LOW (ref >39)
LDL CALC: 100 mg/dL — AB (ref 0–99)
Triglycerides: 176 mg/dL — ABNORMAL HIGH (ref 0–149)
VLDL Cholesterol Cal: 35 mg/dL (ref 5–40)

## 2017-02-25 LAB — COMPREHENSIVE METABOLIC PANEL
A/G RATIO: 0.9 — AB (ref 1.2–2.2)
ALBUMIN: 3.7 g/dL (ref 3.6–4.8)
ALK PHOS: 118 IU/L — AB (ref 39–117)
ALT: 8 IU/L (ref 0–44)
AST: 10 IU/L (ref 0–40)
BILIRUBIN TOTAL: 0.8 mg/dL (ref 0.0–1.2)
BUN / CREAT RATIO: 11 (ref 10–24)
BUN: 9 mg/dL (ref 8–27)
CO2: 24 mmol/L (ref 18–29)
CREATININE: 0.83 mg/dL (ref 0.76–1.27)
Calcium: 9 mg/dL (ref 8.6–10.2)
Chloride: 100 mmol/L (ref 96–106)
GFR calc Af Amer: 110 mL/min/{1.73_m2} (ref >59)
GFR calc non Af Amer: 95 mL/min/{1.73_m2} (ref >59)
GLOBULIN, TOTAL: 4.1 g/dL (ref 1.5–4.5)
Glucose: 88 mg/dL (ref 65–99)
POTASSIUM: 4.8 mmol/L (ref 3.5–5.2)
SODIUM: 139 mmol/L (ref 134–144)
Total Protein: 7.8 g/dL (ref 6.0–8.5)

## 2017-02-25 LAB — CBC WITH DIFFERENTIAL/PLATELET
BASOS: 0 %
Basophils Absolute: 0 10*3/uL (ref 0.0–0.2)
EOS (ABSOLUTE): 0.4 10*3/uL (ref 0.0–0.4)
EOS: 5 %
HEMOGLOBIN: 10.2 g/dL — AB (ref 13.0–17.7)
Hematocrit: 32.1 % — ABNORMAL LOW (ref 37.5–51.0)
Immature Grans (Abs): 0 10*3/uL (ref 0.0–0.1)
Immature Granulocytes: 0 %
LYMPHS ABS: 2.4 10*3/uL (ref 0.7–3.1)
Lymphs: 29 %
MCH: 26 pg — AB (ref 26.6–33.0)
MCHC: 31.8 g/dL (ref 31.5–35.7)
MCV: 82 fL (ref 79–97)
MONOCYTES: 5 %
MONOS ABS: 0.4 10*3/uL (ref 0.1–0.9)
NEUTROS ABS: 4.9 10*3/uL (ref 1.4–7.0)
Neutrophils: 61 %
Platelets: 364 10*3/uL (ref 150–379)
RBC: 3.92 x10E6/uL — ABNORMAL LOW (ref 4.14–5.80)
RDW: 16.6 % — AB (ref 12.3–15.4)
WBC: 8 10*3/uL (ref 3.4–10.8)

## 2017-02-25 LAB — VITAMIN B12: Vitamin B-12: 726 pg/mL (ref 180–914)

## 2017-02-25 LAB — BASIC METABOLIC PANEL
ANION GAP: 8 (ref 5–15)
BUN: 10 mg/dL (ref 6–20)
CHLORIDE: 105 mmol/L (ref 101–111)
CO2: 23 mmol/L (ref 22–32)
Calcium: 8.7 mg/dL — ABNORMAL LOW (ref 8.9–10.3)
Creatinine, Ser: 0.88 mg/dL (ref 0.61–1.24)
GFR calc Af Amer: >60 mL/min (ref >60)
GFR calc non Af Amer: >60 mL/min (ref >60)
GLUCOSE: 150 mg/dL — AB (ref 65–99)
POTASSIUM: 3.6 mmol/L (ref 3.5–5.1)
Sodium: 136 mmol/L (ref 135–145)

## 2017-02-25 LAB — TROPONIN I: TROPONIN I: 0.03 ng/mL — AB (ref <0.03)

## 2017-02-25 LAB — CBC
HEMATOCRIT: 28.5 % — AB (ref 39.0–52.0)
HEMOGLOBIN: 9.1 g/dL — AB (ref 13.0–17.0)
MCH: 25.8 pg — ABNORMAL LOW (ref 26.0–34.0)
MCHC: 31.9 g/dL (ref 30.0–36.0)
MCV: 80.7 fL (ref 78.0–100.0)
Platelets: 283 10*3/uL (ref 150–400)
RBC: 3.53 MIL/uL — ABNORMAL LOW (ref 4.22–5.81)
RDW: 16.6 % — ABNORMAL HIGH (ref 11.5–15.5)
WBC: 8 10*3/uL (ref 4.0–10.5)

## 2017-02-25 LAB — GLUCOSE, CAPILLARY: Glucose-Capillary: 174 mg/dL — ABNORMAL HIGH (ref 65–99)

## 2017-02-25 LAB — IRON AND TIBC
IRON: 14 ug/dL — AB (ref 45–182)
Saturation Ratios: 5 % — ABNORMAL LOW (ref 17.9–39.5)
TIBC: 277 ug/dL (ref 250–450)
UIBC: 263 ug/dL

## 2017-02-25 LAB — FOLATE: FOLATE: 8.4 ng/mL (ref >5.9)

## 2017-02-25 LAB — RETICULOCYTES
RBC.: 3.53 MIL/uL — AB (ref 4.22–5.81)
Retic Count, Absolute: 67.1 10*3/uL (ref 19.0–186.0)
Retic Ct Pct: 1.9 % (ref 0.4–3.1)

## 2017-02-25 LAB — BRAIN NATRIURETIC PEPTIDE: B NATRIURETIC PEPTIDE 5: 576.5 pg/mL — AB (ref 0.0–100.0)

## 2017-02-25 LAB — FERRITIN: FERRITIN: 130 ng/mL (ref 24–336)

## 2017-02-25 LAB — HIV ANTIBODY (ROUTINE TESTING W REFLEX): HIV Screen 4th Generation wRfx: NONREACTIVE

## 2017-02-25 LAB — PROCALCITONIN

## 2017-02-25 LAB — SEDIMENTATION RATE: SED RATE: 115 mm/h — AB (ref 0–16)

## 2017-02-25 MED ORDER — VARENICLINE TARTRATE 1 MG PO TABS: 1.0000 mg | ORAL_TABLET | Freq: Two times a day (BID) | ORAL | Status: DC

## 2017-02-25 MED ORDER — METOPROLOL TARTRATE 25 MG PO TABS
25.0000 mg | ORAL_TABLET | Freq: Two times a day (BID) | ORAL | Status: DC
  Administered 2017-02-25 – 2017-03-01 (×10): 25 mg via ORAL
  Filled 2017-02-25 (×10): qty 1

## 2017-02-25 MED ORDER — VARENICLINE TARTRATE 0.5 MG PO TABS
0.5000 mg | ORAL_TABLET | Freq: Two times a day (BID) | ORAL | Status: DC
  Administered 2017-02-28 – 2017-03-02 (×5): 0.5 mg via ORAL
  Filled 2017-02-25 (×5): qty 1

## 2017-02-25 MED ORDER — IPRATROPIUM-ALBUTEROL 0.5-2.5 (3) MG/3ML IN SOLN
3.0000 mL | Freq: Three times a day (TID) | RESPIRATORY_TRACT | Status: DC
  Administered 2017-02-26 – 2017-03-01 (×11): 3 mL via RESPIRATORY_TRACT
  Filled 2017-02-25 (×12): qty 3

## 2017-02-25 MED ORDER — POTASSIUM CHLORIDE CRYS ER 20 MEQ PO TBCR
40.0000 meq | EXTENDED_RELEASE_TABLET | Freq: Once | ORAL | Status: AC
  Administered 2017-02-25: 40 meq via ORAL
  Filled 2017-02-25: qty 2

## 2017-02-25 MED ORDER — FUROSEMIDE 10 MG/ML IJ SOLN
60.0000 mg | Freq: Once | INTRAMUSCULAR | Status: AC
  Administered 2017-02-25: 60 mg via INTRAVENOUS
  Filled 2017-02-25: qty 6

## 2017-02-25 MED ORDER — IPRATROPIUM-ALBUTEROL 0.5-2.5 (3) MG/3ML IN SOLN
3.0000 mL | Freq: Four times a day (QID) | RESPIRATORY_TRACT | Status: DC
  Administered 2017-02-25 (×2): 3 mL via RESPIRATORY_TRACT
  Filled 2017-02-25 (×2): qty 3

## 2017-02-25 MED ORDER — IOPAMIDOL (ISOVUE-370) INJECTION 76%
INTRAVENOUS | Status: AC
  Administered 2017-02-25: 100 mL
  Filled 2017-02-25: qty 100

## 2017-02-25 MED ORDER — ASPIRIN 81 MG PO CHEW
81.0000 mg | CHEWABLE_TABLET | Freq: Every day | ORAL | Status: DC
  Administered 2017-02-25 – 2017-03-02 (×6): 81 mg via ORAL
  Filled 2017-02-25 (×6): qty 1

## 2017-02-25 MED ORDER — METHYLPREDNISOLONE SODIUM SUCC 125 MG IJ SOLR
60.0000 mg | Freq: Two times a day (BID) | INTRAMUSCULAR | Status: DC
Start: 2017-02-25 — End: 2017-02-26
  Administered 2017-02-25 – 2017-02-26 (×2): 60 mg via INTRAVENOUS
  Filled 2017-02-25 (×2): qty 2

## 2017-02-25 MED ORDER — ALBUTEROL SULFATE (2.5 MG/3ML) 0.083% IN NEBU: 2.5000 mg | INHALATION_SOLUTION | RESPIRATORY_TRACT | Status: DC | PRN

## 2017-02-25 MED ORDER — VARENICLINE TARTRATE 0.5 MG PO TABS
0.5000 mg | ORAL_TABLET | Freq: Every day | ORAL | Status: AC
Start: 2017-02-25 — End: 2017-02-27
  Administered 2017-02-25 – 2017-02-27 (×3): 0.5 mg via ORAL
  Filled 2017-02-25 (×4): qty 1

## 2017-02-25 MED ORDER — ENSURE ENLIVE PO LIQD
237.0000 mL | Freq: Two times a day (BID) | ORAL | Status: DC
  Administered 2017-02-25 – 2017-03-02 (×8): 237 mL via ORAL

## 2017-02-25 NOTE — Progress Notes (Signed)
   02/25/17 0135  Vitals  Temp 98.4 F (36.9 C)  Temp Source Oral  BP 130/82  BP Location Right Arm  BP Method Automatic  Patient Position (if appropriate) Lying  Pulse Rate (!) 108  Pulse Rate Source Dinamap  Resp 20  Oxygen Therapy  SpO2 97 %  O2 Device Room Air  Height and Weight  Height 6\' 1"  (1.854 m)  Weight 77.6 kg (171 lb) (scale a)  Type of Scale Used Standing  Type of Weight Actual  BSA (Calculated - sq m) 2 sq meters  BMI (Calculated) 22.6  Weight in (lb) to have BMI = 25 189.1  Admitted pt to rm 3E05 from ED, pt alert and oriented, denied pain at this time, oriented to room, call bell placed within reach, placed on cardiac monitor, CCMD made aware.
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
## 2017-02-25 NOTE — Evaluation (Signed)
Occupational Therapy Evaluation Patient Details Name: [name] MRN: [id number] DOB: 15-Feb-1956 Today's Date: 02/25/2017    History of Present Illness Admitted with acute hypoxic respiratory failure, cough and abnormal EKG from PCP office. PMH: CAD with CABG, CVA with residual L side weakness, HTN.   Clinical Impression   Pt demonstrated ability to perform ADL transfers at a modified independent level. No OT needs.    Follow Up Recommendations  No OT follow up    Equipment Recommendations  None recommended by OT    Recommendations for Other Services       Precautions / Restrictions Precautions Precautions: None      Mobility Bed Mobility Overal bed mobility: Independent                Transfers Overall transfer level: Modified independent                    Balance                                           ADL either performed or assessed with clinical judgement   ADL Overall ADL's : Modified independent                                             Vision Baseline Vision/History: Wears glasses Wears Glasses: At all times Additional Comments: L eye with impaired vision from recent stroke     Perception     Praxis      Pertinent Vitals/Pain Pain Assessment: No/denies pain     Hand Dominance Right   Extremity/Trunk Assessment Upper Extremity Assessment Upper Extremity Assessment: LUE deficits/detail LUE Deficits / Details: residual weakness in L UE from previous CVA   Lower Extremity Assessment Lower Extremity Assessment: Defer to PT evaluation   Cervical / Trunk Assessment Cervical / Trunk Assessment: Normal   Communication Communication Communication: No difficulties   Cognition Arousal/Alertness: Awake/alert Behavior During Therapy: WFL for tasks assessed/performed Overall Cognitive Status: Within Functional Limits for tasks assessed                                      General Comments       Exercises     Shoulder Instructions      Home Living Family/patient expects to be discharged to:: Private residence Living Arrangements: Other relatives (sister) Available Help at Discharge: Family;Available 24 hours/day Type of Home: Mobile home Home Access: Stairs to enter Entrance Stairs-Number of Steps: 4 Entrance Stairs-Rails: Right Home Layout: One level     Bathroom Shower/Tub: Producer, television/film/video: Standard     Home Equipment: Emergency planning/management officer - 2 wheels;Walker - 4 wheels;Cane - single point          Prior Functioning/Environment Level of Independence: Independent with assistive device(s)        Comments: walks with cane        OT Problem List: Impaired balance (sitting and/or standing)      OT Treatment/Interventions:      OT Goals(Current goals can be found in the care plan section) Acute Rehab OT Goals Patient Stated Goal: to stop smoking  OT Frequency:  Barriers to D/C:            Co-evaluation              End of Session Equipment Utilized During Treatment:  (cane) Nurse Communication: Mobility status  Activity Tolerance: Patient tolerated treatment well Patient left: in chair;with call bell/phone within reach;with family/visitor present  OT Visit Diagnosis: Unsteadiness on feet (R26.81);Hemiplegia and hemiparesis Hemiplegia - Right/Left: Left Hemiplegia - dominant/non-dominant: Non-Dominant Hemiplegia - caused by: Cerebral infarction                Time: 2355-7322 OT Time Calculation (min): 20 min Charges:  OT General Charges $OT Visit: 1 Procedure OT Evaluation $OT Eval Low Complexity: 1 Procedure G-Codes:      Evern Bio 02/25/2017, 12:52 PM  [phone]
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
## 2017-02-25 NOTE — Progress Notes (Signed)
PROGRESS NOTE        PATIENT DETAILS Name: [name] Age: 61 y.o. Sex: male Date of Birth: [birth year] Admit Date: 02/24/2017 Admitting Physician Briscoe Deutscher, MD [id number] Mariana Arn, PA-C  Brief Narrative: Patient is a 61 y.o. male with remote history of CAD status post CABG, CVA with minimal left-sided deficits, long-standing tobacco use who presented to the ED for several months history of exertional dyspnea that has been progressively worsening, he is now unable to even walk a few feet.he was found to have diffuse interstitial lung changes on numerous radiological studies. He was subsequently admitted for further evaluation and treatment  Subjective: No shortness of breath at rest. He has not ambulated yet to see if he is any better.  Denies any chest pain/nausea/vomiting or diarrhea  Telemetry (personally reviewed): Sinus tach-but mostly in sinus rhythm  Assessment/Plan: Acute hypoxemic respiratory failure due to worsening interstitial lung disease: Continue supportive measures, await autoimmune workup, echo. Have consulted pulmonology. Not sure if he actually needs steroids-await formal pulmonary evaluation.  Normocytic anemia: Iron panel consistent with anemia of chronic disease. Continue to monitor. No indication for transfusion.  History of CAD status post CABG in the 1990s: Troponins negative-EKG shows T-wave inversions in the anterolateral leads-no prior EKGs to compare with. CT angiogram of the chest shows a possible focal aneurysmal dilatation of the left ventricle. I suspect shortness of breath is secondary to pulmonary issues rather than CAD. Await echo. Continue aspirin  History of recent CVA: Slight/mild left-sided deficits-continue aspirin.  Tobacco abuse: Counseled, on Chantix.  Right lung nodule: We'll require a repeat CT chest in 6-12 months time he is a smoker.  DVT Prophylaxis: Prophylactic Lovenox  Code Status: Full code    Family Communication: None at bedside  Disposition Plan: Remain inpatient-requires a few more days of hospitalization prior to discharge.   Antimicrobial agents: Anti-infectives    None     Procedures: None  CONSULTS:  pulmonary/intensive care  Time spent: 25 minutes-Greater than 50% of this time was spent in counseling, explanation of diagnosis, planning of further management, and coordination of care.  MEDICATIONS: Scheduled Meds: . aspirin  81 mg Oral Daily  . enoxaparin (LOVENOX) injection  40 mg Subcutaneous Q24H  . methylPREDNISolone (SOLU-MEDROL) injection  60 mg Intravenous Q12H  . metoprolol tartrate  25 mg Oral BID  . sodium chloride flush  3 mL Intravenous Q12H  . sodium chloride flush  3 mL Intravenous Q12H   Continuous Infusions: . sodium chloride     PRN Meds:.sodium chloride, acetaminophen **OR** acetaminophen, bisacodyl, HYDROcodone-acetaminophen, ipratropium-albuterol, ondansetron **OR** ondansetron (ZOFRAN) IV, polyethylene glycol, sodium chloride flush   PHYSICAL EXAM: Vital signs: Vitals:   02/25/17 0135 02/25/17 0545 02/25/17 0752 02/25/17 1004  BP: 130/82 121/73 110/68 132/72  Pulse: (!) 108 83 81 87  Resp: 20 20 18    Temp: 98.4 F (36.9 C) 98 F (36.7 C) 98.2 F (36.8 C)   TempSrc: Oral Oral Oral   SpO2: 97% 96% 92%   Weight: 77.6 kg (171 lb)     Height: 6\' 1"  (1.854 m)      Filed Weights   02/24/17 1920 02/25/17 0135  Weight: 78.5 kg (173 lb) 77.6 kg (171 lb)   Body mass index is 22.56 kg/m.   General appearance :Awake, alert, not in any distress. Speech Clear. Not toxic Looking  Eyes:, pupils equally reactive to light and accomodation,no scleral icterus.Pink conjunctiva HEENT: Atraumatic and Normocephalic Neck: supple, no JVD. No cervical lymphadenopathy. No thyromegaly Resp:Good air entry bilaterally, Inspiratory rales bilaterally CVS: S1 S2 regular, no murmurs.  GI: Bowel sounds present, Non tender and not distended with no  gaurding, rigidity or rebound.No organomegaly Extremities: B/L Lower Ext shows no edema, both legs are warm to touch Neurology:  speech clear,Non focal, sensation is grossly intact. Psychiatric: Normal judgment and insight. Alert and oriented x 3. Normal mood. Musculoskeletal:No digital cyanosis Skin:No Rash, warm and dry Wounds:N/A  I have personally reviewed following labs and imaging studies  LABORATORY DATA: CBC:  Recent Labs Lab 02/19/17 1342 02/24/17 1421 02/24/17 1920 02/25/17 0220  WBC 6.0 8.0 8.6 8.0  NEUTROABS 3.2 4.9 4.9  --   HGB 10.6*  --  10.3* 9.1*  HCT 32.9* 32.1* 31.9* 28.5*  MCV 81.4 82 81.6 80.7  PLT 272 364 335 283    Basic Metabolic Panel:  Recent Labs Lab 02/19/17 1342 02/24/17 1421 02/24/17 1920 02/25/17 0220  NA 138 139 139 136  K 4.0 4.8 4.3 3.6  CL 107 100 105 105  CO2 23 24 26 23   GLUCOSE 91 88 95 150*  BUN 6 9 8 10   CREATININE 0.92 0.83 1.00 0.88  CALCIUM 9.1 9.0 9.2 8.7*    GFR: Estimated Creatinine Clearance: 96.8 mL/min (by C-G formula based on SCr of 0.88 mg/dL).  Liver Function Tests:  Recent Labs Lab 02/19/17 1342 02/24/17 1421  AST 21 10  ALT 9* 8  ALKPHOS 111 118*  BILITOT 0.7 0.8  PROT 8.3* 7.8  ALBUMIN 3.3* 3.7   No results for input(s): LIPASE, AMYLASE in the last 168 hours. No results for input(s): AMMONIA in the last 168 hours.  Coagulation Profile: No results for input(s): INR, PROTIME in the last 168 hours.  Cardiac Enzymes:  Recent Labs Lab 02/25/17 0220 02/25/17 0733  TROPONINI 0.03* <0.03    BNP (last 3 results) No results for input(s): PROBNP in the last 8760 hours.  HbA1C: No results for input(s): HGBA1C in the last 72 hours.  CBG:  Recent Labs Lab 02/25/17 0729  GLUCAP 174*    Lipid Profile:  Recent Labs  02/24/17 1421  CHOL 161  HDL 26*  LDLCALC 100*  TRIG 176*  CHOLHDL 6.2*    Thyroid Function Tests: No results for input(s): TSH, T4TOTAL, FREET4, T3FREE, THYROIDAB  in the last 72 hours.  Anemia Panel:  Recent Labs  02/25/17 0220  VITAMINB12 726  FOLATE 8.4  FERRITIN 130  TIBC 277  IRON 14*  RETICCTPCT 1.9    Urine analysis:    Component Value Date/Time   COLORURINE YELLOW 02/19/2017 1342   APPEARANCEUR CLEAR 02/19/2017 1342   LABSPEC 1.011 02/19/2017 1342   PHURINE 5.0 02/19/2017 1342   GLUCOSEU NEGATIVE 02/19/2017 1342   HGBUR NEGATIVE 02/19/2017 1342   BILIRUBINUR NEGATIVE 02/19/2017 1342   KETONESUR NEGATIVE 02/19/2017 1342   PROTEINUR NEGATIVE 02/19/2017 1342   NITRITE NEGATIVE 02/19/2017 1342   LEUKOCYTESUR NEGATIVE 02/19/2017 1342    Sepsis Labs: Lactic Acid, Venous    Component Value Date/Time   LATICACIDVEN 1.61 02/19/2017 1433    MICROBIOLOGY: No results found for this or any previous visit (from the past 240 hour(s)).  RADIOLOGY STUDIES/RESULTS: Dg Chest 2 View  Result Date: 02/24/2017 CLINICAL DATA:  Abnormal EKG. Follow-up chest radiograph requested. Initial encounter. EXAM: CHEST  2 VIEW COMPARISON:  Chest radiograph performed 02/19/2017 FINDINGS: The  lungs are well-aerated. Peripheral scarring is noted bilaterally, with diffusely increased interstitial markings, concerning for worsening interstitial lung disease. There is no evidence of pleural effusion or pneumothorax. The heart is borderline enlarged. The patient is status post median sternotomy. There are fractures of the superior most sternal wires. No acute osseous abnormalities are seen. IMPRESSION: 1. Peripheral scarring, with diffusely increased interstitial markings, concerning for worsening interstitial lung disease. Underlying pneumonia cannot be excluded. High-resolution CT is recommended to assess for interstitial lung disease, when and as deemed clinically appropriate. 2. Borderline cardiomegaly. Electronically Signed   By: Roanna Raider M.D.   On: 02/24/2017 19:40   Dg Chest 2 View  Result Date: 02/19/2017 CLINICAL DATA:  Cough.  Weakness. EXAM:  CHEST  2 VIEW COMPARISON:  Chest x-ray 02/09/2017, 05/27/2004 CT 02/09/2017. FINDINGS: Prior CABG. Fractured sternal sutures are noted. Stable cardiomegaly. Diffuse interstitial prominence is again noted. Interstitial prominence has improved slightly suggesting improving interstitial edema and/or pneumonitis. A component chronic interstitial lung disease most likely present. Reference is made to prior CT report of 02/09/2017. No pleural effusion pneumothorax. No acute bony abnormality. IMPRESSION: 1. Prior CABG. Fractured sternal sutures again noted. Stable cardiomegaly. Diffuse interstitial prominence is again noted. Interstitial prominence has improved slightly suggesting improving interstitial edema and/or pneumonitis. 2. A component of chronic interstitial lung disease most likely present. Reference is made to prior CT report of 02/09/2017 . Electronically Signed   By: Maisie Fus  Register   On: 02/19/2017 13:45   Dg Chest 2 View  Result Date: 02/09/2017 CLINICAL DATA:  61 year old male with cough and shortness of breath for the past month. Hypertension. Post CABG. Smoker. Initial encounter. EXAM: CHEST  2 VIEW COMPARISON:  05/27/2004. FINDINGS: Significant change since prior examination. Diffuse increased lung markings, possibly chronic although not able to exclude superimposed mild pulmonary vascular congestion. Given the significant change, lack of more recent films and patient's history smoking, chest CT with high-resolution imaging may be considered for further delineation. Fracture of the left first through fifth ribs new from prior exam although appearing chronic. No pneumothorax. Post CABG with fractured sternal wires.  Cardiomegaly. IMPRESSION: Significant change since prior examination. Diffuse increased lung markings, possibly chronic although not able to exclude superimposed mild pulmonary vascular congestion. Given the significant change, lack of more recent films and patient's history of smoking,  chest CT with high-resolution imaging may be considered for further delineation. Fracture of the left first through fifth ribs new from prior exam although appearing chronic. Post CABG Cardiomegaly. Electronically Signed   By: Lacy Duverney M.D.   On: 02/09/2017 11:35   Ct Chest Wo Contrast  Result Date: 02/09/2017 CLINICAL DATA:  Generalized weakness. Productive cough. Current smoker. Abnormal chest radiograph. EXAM: CT CHEST WITHOUT CONTRAST TECHNIQUE: Multidetector CT imaging of the chest was performed following the standard protocol without IV contrast. COMPARISON:  Chest radiograph from earlier today. FINDINGS: Cardiovascular: Mild cardiomegaly. No significant pericardial fluid/thickening. Left main, left anterior descending, left circumflex and right coronary atherosclerosis status post CABG. Atherosclerotic nonaneurysmal thoracic aorta. Top-normal caliber pulmonary arteries (main pulmonary artery diameter 3.2 cm). Mediastinum/Nodes: No discrete thyroid nodules. Unremarkable esophagus. No pathologically enlarged axillary lymph nodes. Right paratracheal adenopathy measuring up to 1.2 cm (series 201/ image 47). Enlarged 1.4 cm subcarinal node (series 201/ image 60). AP window adenopathy measuring up to 1.0 cm (series 201/ image 49). Additional pathologically enlarged mediastinal or gross hilar nodes on this noncontrast scan. Lungs/Pleura: No pneumothorax. No pleural effusion. Mild centrilobular and paraseptal emphysema with diffuse  bronchial wall thickening. Anterior right upper lobe 7 mm solid pulmonary nodule (series 205/ image 44). No acute consolidative airspace disease, lung masses or additional significant pulmonary nodules. There is patchy ground-glass attenuation and reticulation involving the subpleural greater than peribronchovascular lungs, without a clear basilar gradient. There is a suggestion of associated mild traction bronchiolectasis and mild architectural distortion, with no frank  honeycombing. There is mild interlobular septal thickening throughout both lungs. Upper abdomen: Unremarkable. Musculoskeletal: No aggressive appearing focal osseous lesions. Stable discontinuities in the 3 most superior sternotomy wires. Additional sternotomy wires appear intact . Subacute healing left posterior second through seventh rib fractures. Moderate thoracic spondylosis. IMPRESSION: 1. Cardiomegaly with complex spectrum of pulmonary parenchymal findings suggestive of mild pulmonary edema superimposed on a fibrotic interstitial lung disease. Given the absence of frank honeycombing and the absence of a clear basilar gradient, the suspected underlying interstitial lung disease is most compatible with nonspecific interstitial pneumonia (NSIP), however early usual interstitial pneumonia (UIP) cannot be excluded and a follow-up high-resolution chest CT is warranted in 6-12 months. 2. Solid 7 mm right upper lobe pulmonary nodule. Non-contrast chest CT at 6-12 months is recommended. If the nodule is stable at time of repeat CT, then future CT at 18-24 months (from today's scan) is considered optional for low-risk patients, but is recommended for high-risk patients. This recommendation follows the consensus statement: Guidelines for Management of Incidental Pulmonary Nodules Detected on CT Images: From the Fleischner Society 2017; Radiology 2017; 284:228-243. 3. Nonspecific mild mediastinal lymphadenopathy, more likely reactive. 4. Aortic atherosclerosis. Left main and 3 vessel coronary atherosclerosis status post CABG. 5. Mild emphysema and diffuse bronchial wall thickening, suggesting COPD. 6. Subacute healing posterior left second through seventh rib fractures. No pneumothorax. No pleural effusions. Electronically Signed   By: Delbert Phenix M.D.   On: 02/09/2017 16:36   Ct Angio Chest Pe W Or Wo Contrast  Result Date: 02/25/2017 CLINICAL DATA:  61 year old male with hypoxia and tachycardia. EXAM: CT  ANGIOGRAPHY CHEST WITH CONTRAST TECHNIQUE: Multidetector CT imaging of the chest was performed using the standard protocol during bolus administration of intravenous contrast. Multiplanar CT image reconstructions and MIPs were obtained to evaluate the vascular anatomy. CONTRAST:  100 cc Isovue 370 COMPARISON:  Chest radiograph dated 02/24/2017 and CT dated 02/09/2017 FINDINGS: Cardiovascular: There mild to moderate cardiomegaly with enlargement of the left ventricle. There is focal area of aneurysmal dilatation with thinning of the myometrium involving the lateral ventricular wall. Multi vessel coronary vascular calcification and CABG surgery noted. There is no pericardial effusion. The thoracic aorta appears unremarkable. There is mild atherosclerotic calcification of the aorta. Evaluation of the pulmonary arteries is limited due to suboptimal opacification of the peripheral branches. No definite central pulmonary artery embolus identified. V/Q scan may provide better evaluation if there is high clinical concern for acute PE. Mediastinum/Nodes: There is no hilar adenopathy. Top-normal right hilar lymph node. Subcarinal lymph node measures approximately 15 mm in short axis. The esophagus is grossly unremarkable. No mediastinal fluid collection. Lungs/Pleura: There is paraseptal emphysema. There is diffuse interstitial coarsening with mild diffuse bronchial wall thickening. There is ground-glass attenuation of the lung with areas of reticulation involving the subpleural and peribronchovascular lungs. There is a 7 mm right upper lobe subpleural nodule as seen on the prior CT. No acute consolidative changes noted. There is no pleural effusion or pneumothorax. The central airways are patent. Upper Abdomen: No acute abnormality. Musculoskeletal: Mild degenerate changes of the spine. Median sternotomy wires noted. Left posterior  rib fractures as seen on the prior CT, incompletely healed at this time. No acute osseous  pathology. Review of the MIP images confirms the above findings. IMPRESSION: 1. No acute intrathoracic pathology. No CT evidence of central pulmonary artery embolus. Evaluation of the peripheral pulmonary arteries is limited due to suboptimal opacification. V/Q scan may provide better evaluation if there is high clinical concern for acute PE. 2. Paraseptal emphysema with findings suggestive of underlying interstitial lung disease. No new consolidative changes. 3. A 7 mm right upper lobe pulmonary nodule. Non-contrast chest CT at 6-12 months is recommended. If the nodule is stable at time of repeat CT, then future CT at 18-24 months (from today's scan) is considered optional for low-risk patients, but is recommended for high-risk patients. This recommendation follows the consensus statement: Guidelines for Management of Incidental Pulmonary Nodules Detected on CT Images: From the Fleischner Society 2017; Radiology 2017; 284:228-243. 4. Mildly enlarged subcarinal lymph node as seen on the prior study and indeterminate etiology, likely reactive. 5. Atherosclerotic calcification of the aorta with 3 vessel coronary vascular disease and CABG changes. 6. 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 7. Multiple left posterior rib fractures with incomplete healing at this time. No new fractures. Electronically Signed   By: Elgie Collard M.D.   On: 02/25/2017 04:20     LOS: 1 day   Jeoffrey Massed, MD  [facility] Pager:[phone]  If 7PM-7AM, please contact night-coverage [email or web address] Password [facility] 02/25/2017, 10:13 AM

## 2017-02-25 NOTE — Evaluation (Signed)
Physical Therapy Evaluation Patient Details Name: [name] MRN: [id number] DOB: 12/27/1955 Today's Date: 02/25/2017   History of Present Illness  Admitted with acute hypoxic respiratory failure, cough and abnormal EKG from PCP office. PMH: CAD with CABG, CVA with residual L side weakness, HTN.  Clinical Impression  Pt is walking on stairs and able to manage with one person and was observed by his sister who is caregiver.  Will follow acutely to build on today with strength and balance, and progress to HHPT when discharged to ensure his safety will continue to improve.  Has a good start today as he needs minor help only to walk and get up steps.    Follow Up Recommendations Home health PT;Supervision - Intermittent    Equipment Recommendations  None recommended by PT    Recommendations for Other Services       Precautions / Restrictions Precautions Precautions: Fall Precaution Comments: care with SPC on stairs Restrictions Weight Bearing Restrictions: No      Mobility  Bed Mobility Overal bed mobility: Modified Independent             General bed mobility comments: using bedrail and body mechanics  Transfers Overall transfer level: Modified independent Equipment used: Straight cane;1 person hand held assist                Ambulation/Gait Ambulation/Gait assistance: Min guard Ambulation Distance (Feet): 150 Feet Assistive device: Straight cane Gait Pattern/deviations: Step-through pattern;Narrow base of support;Trunk flexed;Decreased stride length Gait velocity: reduced Gait velocity interpretation: Below normal speed for age/gender General Gait Details: short steps but may be due to hip mm tightness  Stairs Stairs: Yes Stairs assistance: Min guard Stair Management: One rail Right Number of Stairs: 6    Wheelchair Mobility    Modified Rankin (Stroke Patients Only)       Balance Overall balance assessment: History of Falls                                           Pertinent Vitals/Pain Pain Assessment: No/denies pain    Home Living Family/patient expects to be discharged to:: Private residence Living Arrangements:  (sister) Available Help at Discharge: Family;Available 24 hours/day Type of Home: Mobile home Home Access: Stairs to enter Entrance Stairs-Rails: Right Entrance Stairs-Number of Steps: 4 Home Layout: One level Home Equipment: Emergency planning/management officer - 2 wheels;Walker - 4 wheels;Cane - single point      Prior Function Level of Independence: Independent with assistive device(s)         Comments: walks with cane     Hand Dominance   Dominant Hand: Right    Extremity/Trunk Assessment   Upper Extremity Assessment Upper Extremity Assessment: Defer to OT evaluation LUE Deficits / Details: residual weakness in L UE from previous CVA    Lower Extremity Assessment Lower Extremity Assessment: LLE deficits/detail LLE Deficits / Details: 4 to 4- strength from old CVA LLE Coordination: decreased gross motor    Cervical / Trunk Assessment Cervical / Trunk Assessment: Normal  Communication   Communication: No difficulties  Cognition Arousal/Alertness: Awake/alert Behavior During Therapy: WFL for tasks assessed/performed Overall Cognitive Status: Within Functional Limits for tasks assessed  General Comments      Exercises     Assessment/Plan    PT Assessment Patient needs continued PT services  PT Problem List Decreased strength;Decreased range of motion;Decreased activity tolerance;Decreased balance;Decreased mobility;Decreased coordination;Decreased knowledge of use of DME;Decreased safety awareness;Cardiopulmonary status limiting activity       PT Treatment Interventions DME instruction;Gait training;Stair training;Functional mobility training;Therapeutic activities;Therapeutic exercise;Balance training;Neuromuscular  re-education;Patient/family education    PT Goals (Current goals can be found in the Care Plan section)  Acute Rehab PT Goals Patient Stated Goal: to stop smoking PT Goal Formulation: With patient/family Time For Goal Achievement: 03/11/17 Potential to Achieve Goals: Good    Frequency Min 3X/week   Barriers to discharge Decreased caregiver support has stairs to get in house    Co-evaluation               End of Session Equipment Utilized During Treatment: Gait belt Activity Tolerance: Patient tolerated treatment well Patient left: in bed Nurse Communication: Mobility status PT Visit Diagnosis: Unsteadiness on feet (R26.81)    Time: 1331-1401 PT Time Calculation (min) (ACUTE ONLY): 30 min   Charges:   PT Evaluation $PT Eval Low Complexity: 1 Procedure PT Treatments $Gait Training: 8-22 mins   PT G Codes:         Ivar Drape Mar 02, 2017, 3:03 PM   Samul Dada, PT MS Acute Rehab Dept. Number: [facility] [phone] and [facility] [phone]

## 2017-02-25 NOTE — Progress Notes (Signed)
Patient ambulated entire length of hallway, maintaining an oxygen saturation of 92 or above. Pt averaged 95% during the walk, all while on room air. Pt reads 95-97% at rest before the walk, and was steadily maintaining such post-walk. At no point did patient c/o discomfort, sob or cp.

## 2017-02-26 ENCOUNTER — Encounter (HOSPITAL_COMMUNITY): Payer: Self-pay | Admitting: Physician Assistant

## 2017-02-26 ENCOUNTER — Inpatient Hospital Stay (HOSPITAL_COMMUNITY): Payer: Medicaid Other

## 2017-02-26 DIAGNOSIS — I5023 Acute on chronic systolic (congestive) heart failure: Secondary | ICD-10-CM

## 2017-02-26 DIAGNOSIS — J80 Acute respiratory distress syndrome: Secondary | ICD-10-CM

## 2017-02-26 DIAGNOSIS — Z8673 Personal history of transient ischemic attack (TIA), and cerebral infarction without residual deficits: Secondary | ICD-10-CM

## 2017-02-26 DIAGNOSIS — J9601 Acute respiratory failure with hypoxia: Secondary | ICD-10-CM

## 2017-02-26 DIAGNOSIS — R0602 Shortness of breath: Secondary | ICD-10-CM

## 2017-02-26 DIAGNOSIS — I10 Essential (primary) hypertension: Secondary | ICD-10-CM

## 2017-02-26 DIAGNOSIS — R9431 Abnormal electrocardiogram [ECG] [EKG]: Secondary | ICD-10-CM

## 2017-02-26 DIAGNOSIS — I2510 Atherosclerotic heart disease of native coronary artery without angina pectoris: Secondary | ICD-10-CM

## 2017-02-26 DIAGNOSIS — F17200 Nicotine dependence, unspecified, uncomplicated: Secondary | ICD-10-CM

## 2017-02-26 LAB — PULMONARY FUNCTION TEST
DL/VA % PRED: 60 %
DL/VA: 2.9 ml/min/mmHg/L
DLCO unc % pred: 32 %
DLCO unc: 11.65 ml/min/mmHg
FEF 25-75 Pre: 1.79 L/sec
FEF2575-%Pred-Pre: 56 %
FEV1-%PRED-PRE: 49 %
FEV1-PRE: 1.96 L
FEV1FVC-%Pred-Pre: 101 %
FEV6-%Pred-Pre: 50 %
FEV6-PRE: 2.54 L
FEV6FVC-%PRED-PRE: 104 %
FVC-%PRED-PRE: 49 %
FVC-PRE: 2.57 L
PRE FEV6/FVC RATIO: 100 %
Pre FEV1/FVC ratio: 76 %

## 2017-02-26 LAB — RHEUMATOID FACTOR: Rhuematoid fact SerPl-aCnc: >650 IU/mL — ABNORMAL HIGH (ref 0.0–13.9)

## 2017-02-26 LAB — ANCA TITERS
C-ANCA: <1:20 {titer}
P-ANCA: <1:20 {titer}

## 2017-02-26 LAB — BASIC METABOLIC PANEL
ANION GAP: 7 (ref 5–15)
BUN: 18 mg/dL (ref 6–20)
CHLORIDE: 102 mmol/L (ref 101–111)
CO2: 28 mmol/L (ref 22–32)
Calcium: 9.2 mg/dL (ref 8.9–10.3)
Creatinine, Ser: 0.84 mg/dL (ref 0.61–1.24)
GFR calc Af Amer: >60 mL/min (ref >60)
GLUCOSE: 156 mg/dL — AB (ref 65–99)
POTASSIUM: 4.4 mmol/L (ref 3.5–5.1)
Sodium: 137 mmol/L (ref 135–145)

## 2017-02-26 LAB — GLUCOSE, CAPILLARY: GLUCOSE-CAPILLARY: 144 mg/dL — AB (ref 65–99)

## 2017-02-26 LAB — ANTI-JO 1 ANTIBODY, IGG

## 2017-02-26 LAB — ANTINUCLEAR ANTIBODIES, IFA: ANA Ab, IFA: NEGATIVE

## 2017-02-26 LAB — MPO/PR-3 (ANCA) ANTIBODIES: ANCA Proteinase 3: <3.5 U/mL (ref 0.0–3.5)

## 2017-02-26 LAB — ANTI-SCLERODERMA ANTIBODY: Scleroderma (Scl-70) (ENA) Antibody, IgG: <0.2 AI (ref 0.0–0.9)

## 2017-02-26 LAB — ECHOCARDIOGRAM COMPLETE
Height: 73 in
Weight: 2691.2 oz

## 2017-02-26 MED ORDER — LISINOPRIL 2.5 MG PO TABS
2.5000 mg | ORAL_TABLET | Freq: Every day | ORAL | Status: DC
  Administered 2017-02-26 – 2017-03-01 (×4): 2.5 mg via ORAL
  Filled 2017-02-26 (×4): qty 1

## 2017-02-26 MED ORDER — FUROSEMIDE 10 MG/ML IJ SOLN
40.0000 mg | Freq: Once | INTRAMUSCULAR | Status: AC
  Administered 2017-02-26: 40 mg via INTRAVENOUS
  Filled 2017-02-26: qty 4

## 2017-02-26 MED ORDER — PREDNISONE 20 MG PO TABS
40.0000 mg | ORAL_TABLET | Freq: Every day | ORAL | Status: DC
Start: 2017-02-27 — End: 2017-02-27

## 2017-02-26 MED ORDER — ATORVASTATIN CALCIUM 20 MG PO TABS
20.0000 mg | ORAL_TABLET | Freq: Every day | ORAL | Status: DC
  Administered 2017-02-26: 20 mg via ORAL
  Filled 2017-02-26: qty 1
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
## 2017-02-26 NOTE — Progress Notes (Signed)
  Echocardiogram 2D Echocardiogram has been performed.  Katelin Kutsch L Androw 02/26/2017, 10:50 AM

## 2017-02-26 NOTE — Progress Notes (Addendum)
PROGRESS NOTE        PATIENT DETAILS Name: [name] Age: 61 y.o. Sex: male Date of Birth: May 13, 1956 Admit Date: 02/24/2017 Admitting Physician Briscoe Deutscher, MD YQM:VHQIO Mariana Arn, PA-C  Brief Narrative: Patient is a 61 y.o. male with remote history of CAD status post CABG, CVA with minimal left-sided deficits, long-standing tobacco use who presented to the ED for several months history of exertional dyspnea that has been progressively worsening, he is now unable to even walk a few feet.he was found to have diffuse interstitial lung changes on numerous radiological studies. He was initially thought to have probable ILD-but his symptoms have rapidly improved with diuresis. Echo showed EF of the 35-40%, with numerous wall motion abnormalities. Plans are to continue pulmonary workup to rule out ILD, but get cardiology workup prior to consideration of discharge. See below for further details  Subjective: Exertional dyspnea that was present on admission has dramatically improved-he was able to walk the hallway without any major problems or desaturation.  Denies any chest pain No nausea vomiting or diarrhea   Telemetry (personally reviewed): Sinus rhythm-nonsustained ventricular tachycardia on 4/26  Assessment/Plan: Acute hypoxemic respiratory failure: Resolved-initially this was thought to be solely from possible underlying interstitial lung disease, but given her rapid improvement following diuretics-and echocardiogram showing suppressed EF-I suspect they may have been a component of CHF. He does not appear volume overloaded on exam-continues to have some amount of rales-I will give him one more dose of Lasix today. ILD workup in progress-PFTs/autoimmune workup ongoing.  Interstitial lung disease: Autoimmune workup pending-so far ANA negative-rheumatoid factor appears to be significantly elevated. He has some arthritic pain in his larger joints- he does not  have any clinical stigmata of rheumatoid disease. Await formal reading of PFTs-spoke with Dr. [name] over the phone-advises that we taper off steroids within 1 week-changed to prednisone. Dr. [name] will follow patient as outpatient-appointment scheduled for 5/31  Acute on chronic systolic heart failure: Exertional dyspnea has rapidly improved-see above Echocardiogram showed EF of 35-40% with akinesis of basal-mid inferior myocardium. Will give one more dose of Lasix today, given prior history of CAD s/p remote CABG in the 1990s-since then he has had no workup-have consulted cardiology. Suspect he may need a nuclear stress test prior to discharge. Continue aspirin, beta blocker and statin.  NSVT: Seen on telemetry-ocular and 4/26-continue beta blocker-we will recheck electrolytes including magnesium tomorrow morning.  History of CAD status post CABG in the 1990s: Troponins negative-EKG shows T-wave inversions in the anterolateral leads-no prior EKGs to compare with.See above  Normocytic anemia: Iron panel consistent with anemia of chronic disease. Continue to monitor. No indication for transfusion.  History of recent CVA: Slight/mild left-sided deficits-continue aspirin.  Tobacco abuse: Counseled, on Chantix.  Right lung nodule: Will require a repeat CT chest in 6-12 months time he is a smoker.  DVT Prophylaxis: Prophylactic Lovenox  Code Status: Full code   Family Communication: Sister at bedside  Disposition Plan: Remain inpatient-requires a few more days of hospitalization prior to discharge.   Antimicrobial agents: Anti-infectives    None     Procedures: None  CONSULTS:  pulmonary/intensive care  Time spent: 25 minutes-Greater than 50% of this time was spent in counseling, explanation of diagnosis, planning of further management, and coordination of care.  MEDICATIONS: Scheduled Meds: . aspirin  81 mg Oral  Daily  . enoxaparin (LOVENOX) injection  40 mg  Subcutaneous Q24H  . feeding supplement (ENSURE ENLIVE)  237 mL Oral BID BM  . ipratropium-albuterol  3 mL Nebulization TID  . methylPREDNISolone (SOLU-MEDROL) injection  60 mg Intravenous Q12H  . metoprolol tartrate  25 mg Oral BID  . sodium chloride flush  3 mL Intravenous Q12H  . sodium chloride flush  3 mL Intravenous Q12H  . varenicline  0.5 mg Oral Daily   Followed by  . [START ON 02/28/2017] varenicline  0.5 mg Oral BID   Followed by  . [START ON 03/04/2017] varenicline  1 mg Oral BID   Continuous Infusions: . sodium chloride     PRN Meds:.sodium chloride, acetaminophen **OR** acetaminophen, albuterol, bisacodyl, HYDROcodone-acetaminophen, ondansetron **OR** ondansetron (ZOFRAN) IV, polyethylene glycol, sodium chloride flush   PHYSICAL EXAM: Vital signs: Vitals:   02/25/17 2034 02/26/17 0615 02/26/17 0856 02/26/17 1014  BP:  113/69  114/69  Pulse:  77  96  Resp:  18    Temp:  97.7 F (36.5 C)    TempSrc:  Oral    SpO2: 96% 94% 95%   Weight:  76.3 kg (168 lb 3.2 oz)    Height:       Filed Weights   02/24/17 1920 02/25/17 0135 02/26/17 0615  Weight: 78.5 kg (173 lb) 77.6 kg (171 lb) 76.3 kg (168 lb 3.2 oz)   Body mass index is 22.19 kg/m.   General appearance :Awake, alert, not in any distress.  Eyes:, pupils equally reactive to light and accomodation,no scleral icterus. HEENT: Atraumatic and Normocephalic Neck: supple, no JVD. Resp:Good air entry bilaterally,few bibasilar rales CVS: S1 S2 regular, no murmurs.  GI: Bowel sounds present, Non tender and not distended with no gaurding, rigidity or rebound. Extremities: B/L Lower Ext shows no edema, both legs are warm to touch Neurology:  speech clear,Non focal, sensation is grossly intact. Psychiatric: Normal judgment and insight. Normal mood. Musculoskeletal:No digital cyanosis Skin:No Rash, warm and dry Wounds:N/A  I have personally reviewed following labs and imaging studies  LABORATORY DATA: CBC:  Recent  Labs Lab 02/19/17 1342 02/24/17 1421 02/24/17 1920 02/25/17 0220  WBC 6.0 8.0 8.6 8.0  NEUTROABS 3.2 4.9 4.9  --   HGB 10.6*  --  10.3* 9.1*  HCT 32.9* 32.1* 31.9* 28.5*  MCV 81.4 82 81.6 80.7  PLT 272 364 335 283    Basic Metabolic Panel:  Recent Labs Lab 02/19/17 1342 02/24/17 1421 02/24/17 1920 02/25/17 0220 02/26/17 0400  NA 138 139 139 136 137  K 4.0 4.8 4.3 3.6 4.4  CL 107 100 105 105 102  CO2 23 24 26 23 28   GLUCOSE 91 88 95 150* 156*  BUN 6 9 8 10 18   CREATININE 0.92 0.83 1.00 0.88 0.84  CALCIUM 9.1 9.0 9.2 8.7* 9.2    GFR: Estimated Creatinine Clearance: 99.7 mL/min (by C-G formula based on SCr of 0.84 mg/dL).  Liver Function Tests:  Recent Labs Lab 02/19/17 1342 02/24/17 1421  AST 21 10  ALT 9* 8  ALKPHOS 111 118*  BILITOT 0.7 0.8  PROT 8.3* 7.8  ALBUMIN 3.3* 3.7   No results for input(s): LIPASE, AMYLASE in the last 168 hours. No results for input(s): AMMONIA in the last 168 hours.  Coagulation Profile: No results for input(s): INR, PROTIME in the last 168 hours.  Cardiac Enzymes:  Recent Labs Lab 02/25/17 0220 02/25/17 0733  TROPONINI 0.03* <0.03    BNP (last 3 results) No  results for input(s): PROBNP in the last 8760 hours.  HbA1C: No results for input(s): HGBA1C in the last 72 hours.  CBG:  Recent Labs Lab 02/25/17 0729 02/26/17 0614  GLUCAP 174* 144*    Lipid Profile:  Recent Labs  02/24/17 1421  CHOL 161  HDL 26*  LDLCALC 100*  TRIG 176*  CHOLHDL 6.2*    Thyroid Function Tests: No results for input(s): TSH, T4TOTAL, FREET4, T3FREE, THYROIDAB in the last 72 hours.  Anemia Panel:  Recent Labs  02/25/17 0220  VITAMINB12 726  FOLATE 8.4  FERRITIN 130  TIBC 277  IRON 14*  RETICCTPCT 1.9    Urine analysis:    Component Value Date/Time   COLORURINE YELLOW 02/19/2017 1342   APPEARANCEUR CLEAR 02/19/2017 1342   LABSPEC 1.011 02/19/2017 1342   PHURINE 5.0 02/19/2017 1342   GLUCOSEU NEGATIVE  02/19/2017 1342   HGBUR NEGATIVE 02/19/2017 1342   BILIRUBINUR NEGATIVE 02/19/2017 1342   KETONESUR NEGATIVE 02/19/2017 1342   PROTEINUR NEGATIVE 02/19/2017 1342   NITRITE NEGATIVE 02/19/2017 1342   LEUKOCYTESUR NEGATIVE 02/19/2017 1342    Sepsis Labs: Lactic Acid, Venous    Component Value Date/Time   LATICACIDVEN 1.61 02/19/2017 1433    MICROBIOLOGY: No results found for this or any previous visit (from the past 240 hour(s)).  RADIOLOGY STUDIES/RESULTS: Dg Chest 2 View  Result Date: 02/24/2017 CLINICAL DATA:  Abnormal EKG. Follow-up chest radiograph requested. Initial encounter. EXAM: CHEST  2 VIEW COMPARISON:  Chest radiograph performed 02/19/2017 FINDINGS: The lungs are well-aerated. Peripheral scarring is noted bilaterally, with diffusely increased interstitial markings, concerning for worsening interstitial lung disease. There is no evidence of pleural effusion or pneumothorax. The heart is borderline enlarged. The patient is status post median sternotomy. There are fractures of the superior most sternal wires. No acute osseous abnormalities are seen. IMPRESSION: 1. Peripheral scarring, with diffusely increased interstitial markings, concerning for worsening interstitial lung disease. Underlying pneumonia cannot be excluded. High-resolution CT is recommended to assess for interstitial lung disease, when and as deemed clinically appropriate. 2. Borderline cardiomegaly. Electronically Signed   By: Roanna Raider M.D.   On: 02/24/2017 19:40   Dg Chest 2 View  Result Date: 02/19/2017 CLINICAL DATA:  Cough.  Weakness. EXAM: CHEST  2 VIEW COMPARISON:  Chest x-ray 02/09/2017, 05/27/2004 CT 02/09/2017. FINDINGS: Prior CABG. Fractured sternal sutures are noted. Stable cardiomegaly. Diffuse interstitial prominence is again noted. Interstitial prominence has improved slightly suggesting improving interstitial edema and/or pneumonitis. A component chronic interstitial lung disease most likely  present. Reference is made to prior CT report of 02/09/2017. No pleural effusion pneumothorax. No acute bony abnormality. IMPRESSION: 1. Prior CABG. Fractured sternal sutures again noted. Stable cardiomegaly. Diffuse interstitial prominence is again noted. Interstitial prominence has improved slightly suggesting improving interstitial edema and/or pneumonitis. 2. A component of chronic interstitial lung disease most likely present. Reference is made to prior CT report of 02/09/2017 . Electronically Signed   By: Maisie Fus  Register   On: 02/19/2017 13:45   Dg Chest 2 View  Result Date: 02/09/2017 CLINICAL DATA:  61 year old male with cough and shortness of breath for the past month. Hypertension. Post CABG. Smoker. Initial encounter. EXAM: CHEST  2 VIEW COMPARISON:  05/27/2004. FINDINGS: Significant change since prior examination. Diffuse increased lung markings, possibly chronic although not able to exclude superimposed mild pulmonary vascular congestion. Given the significant change, lack of more recent films and patient's history smoking, chest CT with high-resolution imaging may be considered for further delineation. Fracture of the  left first through fifth ribs new from prior exam although appearing chronic. No pneumothorax. Post CABG with fractured sternal wires.  Cardiomegaly. IMPRESSION: Significant change since prior examination. Diffuse increased lung markings, possibly chronic although not able to exclude superimposed mild pulmonary vascular congestion. Given the significant change, lack of more recent films and patient's history of smoking, chest CT with high-resolution imaging may be considered for further delineation. Fracture of the left first through fifth ribs new from prior exam although appearing chronic. Post CABG Cardiomegaly. Electronically Signed   By: Lacy Duverney M.D.   On: 02/09/2017 11:35   Ct Chest Wo Contrast  Result Date: 02/09/2017 CLINICAL DATA:  Generalized weakness. Productive  cough. Current smoker. Abnormal chest radiograph. EXAM: CT CHEST WITHOUT CONTRAST TECHNIQUE: Multidetector CT imaging of the chest was performed following the standard protocol without IV contrast. COMPARISON:  Chest radiograph from earlier today. FINDINGS: Cardiovascular: Mild cardiomegaly. No significant pericardial fluid/thickening. Left main, left anterior descending, left circumflex and right coronary atherosclerosis status post CABG. Atherosclerotic nonaneurysmal thoracic aorta. Top-normal caliber pulmonary arteries (main pulmonary artery diameter 3.2 cm). Mediastinum/Nodes: No discrete thyroid nodules. Unremarkable esophagus. No pathologically enlarged axillary lymph nodes. Right paratracheal adenopathy measuring up to 1.2 cm (series 201/ image 47). Enlarged 1.4 cm subcarinal node (series 201/ image 60). AP window adenopathy measuring up to 1.0 cm (series 201/ image 49). Additional pathologically enlarged mediastinal or gross hilar nodes on this noncontrast scan. Lungs/Pleura: No pneumothorax. No pleural effusion. Mild centrilobular and paraseptal emphysema with diffuse bronchial wall thickening. Anterior right upper lobe 7 mm solid pulmonary nodule (series 205/ image 44). No acute consolidative airspace disease, lung masses or additional significant pulmonary nodules. There is patchy ground-glass attenuation and reticulation involving the subpleural greater than peribronchovascular lungs, without a clear basilar gradient. There is a suggestion of associated mild traction bronchiolectasis and mild architectural distortion, with no frank honeycombing. There is mild interlobular septal thickening throughout both lungs. Upper abdomen: Unremarkable. Musculoskeletal: No aggressive appearing focal osseous lesions. Stable discontinuities in the 3 most superior sternotomy wires. Additional sternotomy wires appear intact . Subacute healing left posterior second through seventh rib fractures. Moderate thoracic  spondylosis. IMPRESSION: 1. Cardiomegaly with complex spectrum of pulmonary parenchymal findings suggestive of mild pulmonary edema superimposed on a fibrotic interstitial lung disease. Given the absence of frank honeycombing and the absence of a clear basilar gradient, the suspected underlying interstitial lung disease is most compatible with nonspecific interstitial pneumonia (NSIP), however early usual interstitial pneumonia (UIP) cannot be excluded and a follow-up high-resolution chest CT is warranted in 6-12 months. 2. Solid 7 mm right upper lobe pulmonary nodule. Non-contrast chest CT at 6-12 months is recommended. If the nodule is stable at time of repeat CT, then future CT at 18-24 months (from today's scan) is considered optional for low-risk patients, but is recommended for high-risk patients. This recommendation follows the consensus statement: Guidelines for Management of Incidental Pulmonary Nodules Detected on CT Images: From the Fleischner Society 2017; Radiology 2017; 284:228-243. 3. Nonspecific mild mediastinal lymphadenopathy, more likely reactive. 4. Aortic atherosclerosis. Left main and 3 vessel coronary atherosclerosis status post CABG. 5. Mild emphysema and diffuse bronchial wall thickening, suggesting COPD. 6. Subacute healing posterior left second through seventh rib fractures. No pneumothorax. No pleural effusions. Electronically Signed   By: Delbert Phenix M.D.   On: 02/09/2017 16:36   Ct Angio Chest Pe W Or Wo Contrast  Result Date: 02/25/2017 CLINICAL DATA:  61 year old male with hypoxia and tachycardia. EXAM: CT ANGIOGRAPHY CHEST  WITH CONTRAST TECHNIQUE: Multidetector CT imaging of the chest was performed using the standard protocol during bolus administration of intravenous contrast. Multiplanar CT image reconstructions and MIPs were obtained to evaluate the vascular anatomy. CONTRAST:  100 cc Isovue 370 COMPARISON:  Chest radiograph dated 02/24/2017 and CT dated 02/09/2017 FINDINGS:  Cardiovascular: There mild to moderate cardiomegaly with enlargement of the left ventricle. There is focal area of aneurysmal dilatation with thinning of the myometrium involving the lateral ventricular wall. Multi vessel coronary vascular calcification and CABG surgery noted. There is no pericardial effusion. The thoracic aorta appears unremarkable. There is mild atherosclerotic calcification of the aorta. Evaluation of the pulmonary arteries is limited due to suboptimal opacification of the peripheral branches. No definite central pulmonary artery embolus identified. V/Q scan may provide better evaluation if there is high clinical concern for acute PE. Mediastinum/Nodes: There is no hilar adenopathy. Top-normal right hilar lymph node. Subcarinal lymph node measures approximately 15 mm in short axis. The esophagus is grossly unremarkable. No mediastinal fluid collection. Lungs/Pleura: There is paraseptal emphysema. There is diffuse interstitial coarsening with mild diffuse bronchial wall thickening. There is ground-glass attenuation of the lung with areas of reticulation involving the subpleural and peribronchovascular lungs. There is a 7 mm right upper lobe subpleural nodule as seen on the prior CT. No acute consolidative changes noted. There is no pleural effusion or pneumothorax. The central airways are patent. Upper Abdomen: No acute abnormality. Musculoskeletal: Mild degenerate changes of the spine. Median sternotomy wires noted. Left posterior rib fractures as seen on the prior CT, incompletely healed at this time. No acute osseous pathology. Review of the MIP images confirms the above findings. IMPRESSION: 1. No acute intrathoracic pathology. No CT evidence of central pulmonary artery embolus. Evaluation of the peripheral pulmonary arteries is limited due to suboptimal opacification. V/Q scan may provide better evaluation if there is high clinical concern for acute PE. 2. Paraseptal emphysema with findings  suggestive of underlying interstitial lung disease. No new consolidative changes. 3. A 7 mm right upper lobe pulmonary nodule. Non-contrast chest CT at 6-12 months is recommended. If the nodule is stable at time of repeat CT, then future CT at 18-24 months (from today's scan) is considered optional for low-risk patients, but is recommended for high-risk patients. This recommendation follows the consensus statement: Guidelines for Management of Incidental Pulmonary Nodules Detected on CT Images: From the Fleischner Society 2017; Radiology 2017; 284:228-243. 4. Mildly enlarged subcarinal lymph node as seen on the prior study and indeterminate etiology, likely reactive. 5. Atherosclerotic calcification of the aorta with 3 vessel coronary vascular disease and CABG changes. 6. 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 7. Multiple left posterior rib fractures with incomplete healing at this time. No new fractures. Electronically Signed   By: Elgie Collard M.D.   On: 02/25/2017 04:20     LOS: 2 days   Jeoffrey Massed, MD  [facility] Pager:[phone]  If 7PM-7AM, please contact night-coverage [email or web address] Password [email or web address] 02/26/2017, 1:11 PM

## 2017-02-26 NOTE — Progress Notes (Signed)
Patient transported to respiratory for PFT.

## 2017-02-27 ENCOUNTER — Inpatient Hospital Stay (HOSPITAL_COMMUNITY): Payer: Medicaid Other

## 2017-02-27 DIAGNOSIS — R079 Chest pain, unspecified: Secondary | ICD-10-CM

## 2017-02-27 DIAGNOSIS — I5043 Acute on chronic combined systolic (congestive) and diastolic (congestive) heart failure: Secondary | ICD-10-CM

## 2017-02-27 DIAGNOSIS — R9439 Abnormal result of other cardiovascular function study: Secondary | ICD-10-CM

## 2017-02-27 DIAGNOSIS — I25110 Atherosclerotic heart disease of native coronary artery with unstable angina pectoris: Secondary | ICD-10-CM

## 2017-02-27 LAB — BASIC METABOLIC PANEL WITH GFR
Anion gap: 8 (ref 5–15)
BUN: 27 mg/dL — ABNORMAL HIGH (ref 6–20)
CO2: 29 mmol/L (ref 22–32)
Calcium: 9.1 mg/dL (ref 8.9–10.3)
Chloride: 101 mmol/L (ref 101–111)
Creatinine, Ser: 0.91 mg/dL (ref 0.61–1.24)
GFR calc Af Amer: >60 mL/min
GFR calc non Af Amer: >60 mL/min
Glucose, Bld: 106 mg/dL — ABNORMAL HIGH (ref 65–99)
Potassium: 4.2 mmol/L (ref 3.5–5.1)
Sodium: 138 mmol/L (ref 135–145)

## 2017-02-27 LAB — MAGNESIUM: Magnesium: 2.3 mg/dL (ref 1.7–2.4)

## 2017-02-27 LAB — GLUCOSE, CAPILLARY: Glucose-Capillary: 104 mg/dL — ABNORMAL HIGH (ref 65–99)

## 2017-02-27 LAB — LIPID PANEL
Cholesterol: 164 mg/dL (ref 0–200)
HDL: 32 mg/dL — ABNORMAL LOW
LDL Cholesterol: 103 mg/dL — ABNORMAL HIGH (ref 0–99)
Total CHOL/HDL Ratio: 5.1 ratio
Triglycerides: 144 mg/dL
VLDL: 29 mg/dL (ref 0–40)

## 2017-02-27 LAB — CYCLIC CITRUL PEPTIDE ANTIBODY, IGG/IGA: CCP Antibodies IgG/IgA: 36 units — ABNORMAL HIGH (ref 0–19)

## 2017-02-27 LAB — PROCALCITONIN: Procalcitonin: <0.1 ng/mL

## 2017-02-27 MED ORDER — REGADENOSON 0.4 MG/5ML IV SOLN
0.4000 mg | Freq: Once | INTRAVENOUS | Status: AC
  Administered 2017-02-27: 0.4 mg via INTRAVENOUS
  Filled 2017-02-27: qty 5

## 2017-02-27 MED ORDER — TECHNETIUM TC 99M TETROFOSMIN IV KIT
30.0000 | PACK | Freq: Once | INTRAVENOUS | Status: AC | PRN
  Administered 2017-02-27: 30 via INTRAVENOUS

## 2017-02-27 MED ORDER — TECHNETIUM TC 99M TETROFOSMIN IV KIT
10.0000 | PACK | Freq: Once | INTRAVENOUS | Status: AC | PRN
  Administered 2017-02-27: 10 via INTRAVENOUS

## 2017-02-27 MED ORDER — ATORVASTATIN CALCIUM 40 MG PO TABS
40.0000 mg | ORAL_TABLET | Freq: Every day | ORAL | Status: DC
  Administered 2017-02-27 – 2017-03-01 (×3): 40 mg via ORAL
  Filled 2017-02-27 (×3): qty 1

## 2017-02-27 MED ORDER — PREDNISONE 20 MG PO TABS
20.0000 mg | ORAL_TABLET | Freq: Every day | ORAL | Status: DC
  Administered 2017-02-27 – 2017-02-28 (×2): 20 mg via ORAL
  Filled 2017-02-27 (×2): qty 1

## 2017-02-27 MED ORDER — REGADENOSON 0.4 MG/5ML IV SOLN
INTRAVENOUS | Status: AC
  Filled 2017-02-27: qty 5

## 2017-02-27 MED ORDER — FUROSEMIDE 10 MG/ML IJ SOLN: 60.0000 mg | Freq: Two times a day (BID) | INTRAMUSCULAR | Status: DC

## 2017-02-27 NOTE — Progress Notes (Signed)
Pt visiting with friends and family. No needs

## 2017-02-27 NOTE — Progress Notes (Signed)
Progress Note  Patient Name: [name] Date of Encounter: 02/27/2017  Primary Cardiologist: Dr. Delton See  Subjective   Denies any CP or SOB. Feeling well today. Seen in Nuc med  Inpatient Medications    Scheduled Meds: . regadenoson      . aspirin  81 mg Oral Daily  . atorvastatin  20 mg Oral q1800  . enoxaparin (LOVENOX) injection  40 mg Subcutaneous Q24H  . feeding supplement (ENSURE ENLIVE)  237 mL Oral BID BM  . ipratropium-albuterol  3 mL Nebulization TID  . lisinopril  2.5 mg Oral Daily  . metoprolol tartrate  25 mg Oral BID  . predniSONE  20 mg Oral Q breakfast  . sodium chloride flush  3 mL Intravenous Q12H  . varenicline  0.5 mg Oral Daily   Followed by  . [START ON 02/28/2017] varenicline  0.5 mg Oral BID   Followed by  . [START ON 03/04/2017] varenicline  1 mg Oral BID   Continuous Infusions:  PRN Meds: albuterol, bisacodyl, HYDROcodone-acetaminophen, [DISCONTINUED] ondansetron **OR** ondansetron (ZOFRAN) IV, polyethylene glycol   Vital Signs    Vitals:   02/27/17 0917 02/27/17 0939 02/27/17 0941 02/27/17 0942  BP: 119/83 112/70 103/72 118/75  Pulse:      Resp:      Temp:      TempSrc:      SpO2:      Weight:      Height:        Intake/Output Summary (Last 24 hours) at 02/27/17 0948 Last data filed at 02/27/17 1245  Gross per 24 hour  Intake              720 ml  Output              700 ml  Net               20 ml   Filed Weights   02/25/17 0135 02/26/17 0615 02/27/17 0518  Weight: 171 lb (77.6 kg) 168 lb 3.2 oz (76.3 kg) 169 lb 4.8 oz (76.8 kg)    Telemetry    NSR with 1 episode of 6 beats of NSVT - Personally Reviewed  ECG    EKG 4/26 NSR with TWI in lateral leads - Personally Reviewed  Physical Exam   GEN: No acute distress.   Neck: No JVD Cardiac: RRR, no murmurs, rubs, or gallops.  Respiratory: Clear to auscultation bilaterally. GI: Soft, nontender, non-distended  MS: No edema; No deformity. Neuro:  Nonfocal  Psych:  Normal affect   Labs    Chemistry Recent Labs Lab 02/24/17 1421  02/25/17 0220 02/26/17 0400 02/27/17 0502  NA 139  < > 136 137 138  K 4.8  < > 3.6 4.4 4.2  CL 100  < > 105 102 101  CO2 24  < > 23 28 29   GLUCOSE 88  < > 150* 156* 106*  BUN 9  < > 10 18 27*  CREATININE 0.83  < > 0.88 0.84 0.91  CALCIUM 9.0  < > 8.7* 9.2 9.1  PROT 7.8  --   --   --   --   ALBUMIN 3.7  --   --   --   --   AST 10  --   --   --   --   ALT 8  --   --   --   --   ALKPHOS 118*  --   --   --   --  BILITOT 0.8  --   --   --   --   GFRNONAA 95  < > >60 >60 >60  GFRAA 110  < > >60 >60 >60  ANIONGAP  --   < > 8 7 8   < > = values in this interval not displayed.   Hematology Recent Labs Lab 02/24/17 1421 02/24/17 1920 02/25/17 0220  WBC 8.0 8.6 8.0  RBC 3.92* 3.91* 3.53*  3.53*  HGB  --  10.3* 9.1*  HCT 32.1* 31.9* 28.5*  MCV 82 81.6 80.7  MCH 26.0* 26.3 25.8*  MCHC 31.8 32.3 31.9  RDW 16.6* 16.9* 16.6*  PLT 364 335 283    Cardiac Enzymes Recent Labs Lab 02/25/17 0220 02/25/17 0733  TROPONINI 0.03* <0.03    Recent Labs Lab 02/24/17 1940 02/24/17 2256  TROPIPOC 0.00 0.02     BNP Recent Labs Lab 02/24/17 1920  BNP 576.5*     DDimer No results for input(s): DDIMER in the last 168 hours.   Radiology    No results found.  Cardiac Studies   Echo 02/26/2017 LV EF: 35% -   40%  Study Conclusions  - Left ventricle: The cavity size was mildly dilated. Wall   thickness was increased in a pattern of mild LVH. There was focal   basal hypertrophy. Systolic function was moderately reduced. The   estimated ejection fraction was in the range of 35% to 40%.   Diffuse hypokinesis. Akinesis of the basal-midinferior   myocardium. Doppler parameters are consistent with abnormal left   ventricular relaxation (grade 1 diastolic dysfunction). - Mitral valve: There was mild regurgitation. - Left atrium: The atrium was mildly to moderately dilated. - Right atrium: The atrium was  mildly dilated. - Pulmonary arteries: Systolic pressure was moderately increased.   PA peak pressure: 42 mm Hg (S).    Patient Profile     61 y.o. male with a PMH significant for CAD s/p MI x 3 s/p CABG x 4 (1993 with 09-12-1974), stroke with minimal left-sided deficits (11/2016) s/p right endarterectomy, significant smoking history, HTN, and AAA. He was advised to report to [facility] by his PCP after an abnormal EKG. Echocardiogram showed new heart failure.  Assessment & Plan    1. Acute on chronic systolic and diastolic heart failure  - given h/o poor compliance and recent echo showing EF 35%, plan for myoview, pending today.   - still has mild crackle in bilateral base, but no lower extremity edema, more likely atelectasis. Will start on 20mg  daily of PO lasix. No further workup if myoview is negative.   - continue lisinopril. Given LV dysfunction, consider consolidate 25mg  BID metoprolol tartrate to 50mg  metoprolol succinate.   2. Tobacco abuse  - CTA negative, CXR suggestive of ILD, critical care consulted- per primary team and critical care  3. CAD s/p CABG x 4 in 1993: continue statin, increase lipitor to 40, LDL 103  4. AAA  5. HTN  6. RUL pulm nodule: seen on CTA of chest, recommended future CT at 18-24 month.   , PA  02/27/2017, 9:48 AM     The patient was seen, examined and discussed with , PA-C and I agree with the above.   Please see separate note.  1994 02/27/2017

## 2017-02-27 NOTE — Progress Notes (Signed)
Patient Name: [name] Date of Encounter: 02/27/2017  Principal Problem:   Interstitial lung disease (HCC) Active Problems:   CAD (coronary artery disease)   Normocytic anemia   AAA (abdominal aortic aneurysm) (HCC)   Abnormal EKG   Acute respiratory failure with hypoxia (HCC)   History of stroke   Essential hypertension   Current smoker   Exertional shortness of breath   Nodule of right lung   Length of Stay: 3  SUBJECTIVE  He feels better and denies chest pain, improved SOB.   CURRENT MEDS . regadenoson      . aspirin  81 mg Oral Daily  . atorvastatin  40 mg Oral q1800  . enoxaparin (LOVENOX) injection  40 mg Subcutaneous Q24H  . feeding supplement (ENSURE ENLIVE)  237 mL Oral BID BM  . ipratropium-albuterol  3 mL Nebulization TID  . lisinopril  2.5 mg Oral Daily  . metoprolol tartrate  25 mg Oral BID  . predniSONE  20 mg Oral Q breakfast  . sodium chloride flush  3 mL Intravenous Q12H  . varenicline  0.5 mg Oral Daily   Followed by  . [START ON 02/28/2017] varenicline  0.5 mg Oral BID   Followed by  . [START ON 03/04/2017] varenicline  1 mg Oral BID    OBJECTIVE  Vitals:   02/27/17 0917 02/27/17 0939 02/27/17 0941 02/27/17 0942  BP: 119/83 112/70 103/72 118/75  Pulse:      Resp:      Temp:      TempSrc:      SpO2:      Weight:      Height:        Intake/Output Summary (Last 24 hours) at 02/27/17 1127 Last data filed at 02/27/17 1191  Gross per 24 hour  Intake              720 ml  Output              700 ml  Net               20 ml   Filed Weights   02/25/17 0135 02/26/17 0615 02/27/17 0518  Weight: 171 lb (77.6 kg) 168 lb 3.2 oz (76.3 kg) 169 lb 4.8 oz (76.8 kg)    PHYSICAL EXAM  General: Pleasant, NAD. Neuro: Alert and oriented X 3. Moves all extremities spontaneously. Psych: Normal affect. HEENT:  Normal  Neck: Supple without bruits or JVD. Lungs:  Resp regular and unlabored, crackles B/L. Heart: RRR no s3, s4, or  murmurs. Abdomen: Soft, non-tender, non-distended, BS + x 4.  Extremities: No clubbing, cyanosis or edema. DP/PT/Radials 2+ and equal bilaterally.  Accessory Clinical Findings  CBC  Recent Labs  02/24/17 1421 02/24/17 1920 02/25/17 0220  WBC 8.0 8.6 8.0  NEUTROABS 4.9 4.9  --   HGB  --  10.3* 9.1*  HCT 32.1* 31.9* 28.5*  MCV 82 81.6 80.7  PLT 364 335 283   Basic Metabolic Panel  Recent Labs  02/26/17 0400 02/27/17 0502  NA 137 138  K 4.4 4.2  CL 102 101  CO2 28 29  GLUCOSE 156* 106*  BUN 18 27*  CREATININE 0.84 0.91  CALCIUM 9.2 9.1  MG  --  2.3   Liver Function Tests  Recent Labs  02/24/17 1421  AST 10  ALT 8  ALKPHOS 118*  BILITOT 0.8  PROT 7.8  ALBUMIN 3.7   No results for input(s): LIPASE, AMYLASE in the  last 72 hours. Cardiac Enzymes  Recent Labs  02/25/17 0220 02/25/17 0733  TROPONINI 0.03* <0.03   BNP Invalid input(s): POCBNP D-Dimer No results for input(s): DDIMER in the last 72 hours. Hemoglobin A1C No results for input(s): HGBA1C in the last 72 hours. Fasting Lipid Panel  Recent Labs  02/27/17 0502  CHOL 164  HDL 32*  LDLCALC 103*  TRIG 144  CHOLHDL 5.1   Thyroid Function Tests No results for input(s): TSH, T4TOTAL, T3FREE, THYROIDAB in the last 72 hours.  Invalid input(s): FREET3  Radiology/Studies  Dg Chest 2 View  Result Date: 02/24/2017 CLINICAL DATA:  Abnormal EKG. Follow-up chest radiograph requested. Initial encounter. EXAM: CHEST  2 VIEW COMPARISON:  Chest radiograph performed 02/19/2017 FINDINGS: The lungs are well-aerated. Peripheral scarring is noted bilaterally, with diffusely increased interstitial markings, concerning for worsening interstitial lung disease. There is no evidence of pleural effusion or pneumothorax. The heart is borderline enlarged. The patient is status post median sternotomy. There are fractures of the superior most sternal wires. No acute osseous abnormalities are seen. IMPRESSION: 1.  Peripheral scarring, with diffusely increased interstitial markings, concerning for worsening interstitial lung disease. Underlying pneumonia cannot be excluded. High-resolution CT is recommended to assess for interstitial lung disease, when and as deemed clinically appropriate. 2. Borderline cardiomegaly. Electronically Signed   By: Roanna Raider M.D.   On: 02/24/2017 19:40   Dg Chest 2 View  Result Date: 02/19/2017 CLINICAL DATA:  Cough.  Weakness. EXAM: CHEST  2 VIEW COMPARISON:  Chest x-ray 02/09/2017, 05/27/2004 CT 02/09/2017. FINDINGS: Prior CABG. Fractured sternal sutures are noted. Stable cardiomegaly. Diffuse interstitial prominence is again noted. Interstitial prominence has improved slightly suggesting improving interstitial edema and/or pneumonitis. A component chronic interstitial lung disease most likely present. Reference is made to prior CT report of 02/09/2017. No pleural effusion pneumothorax. No acute bony abnormality. IMPRESSION: 1. Prior CABG. Fractured sternal sutures again noted. Stable cardiomegaly. Diffuse interstitial prominence is again noted. Interstitial prominence has improved slightly suggesting improving interstitial edema and/or pneumonitis. 2. A component of chronic interstitial lung disease most likely present. Reference is made to prior CT report of 02/09/2017 . Electronically Signed   By: Maisie Fus  Register   On: 02/19/2017 13:45   Dg Chest 2 View  Result Date: 02/09/2017 CLINICAL DATA:  61 year old male with cough and shortness of breath for the past month. Hypertension. Post CABG. Smoker. Initial encounter. EXAM: CHEST  2 VIEW COMPARISON:  05/27/2004. FINDINGS: Significant change since prior examination. Diffuse increased lung markings, possibly chronic although not able to exclude superimposed mild pulmonary vascular congestion. Given the significant change, lack of more recent films and patient's history smoking, chest CT with high-resolution imaging may be considered  for further delineation. Fracture of the left first through fifth ribs new from prior exam although appearing chronic. No pneumothorax. Post CABG with fractured sternal wires.  Cardiomegaly. IMPRESSION: Significant change since prior examination. Diffuse increased lung markings, possibly chronic although not able to exclude superimposed mild pulmonary vascular congestion. Given the significant change, lack of more recent films and patient's history of smoking, chest CT with high-resolution imaging may be considered for further delineation. Fracture of the left first through fifth ribs new from prior exam although appearing chronic. Post CABG Cardiomegaly. Electronically Signed   By: Lacy Duverney M.D.   On: 02/09/2017 11:35   Ct Chest Wo Contrast  Result Date: 02/09/2017 CLINICAL DATA:  Generalized weakness. Productive cough. Current smoker. Abnormal chest radiograph. EXAM: CT CHEST WITHOUT CONTRAST TECHNIQUE: Multidetector CT  imaging of the chest was performed following the standard protocol without IV contrast. COMPARISON:  Chest radiograph from earlier today. FINDINGS: Cardiovascular: Mild cardiomegaly. No significant pericardial fluid/thickening. Left main, left anterior descending, left circumflex and right coronary atherosclerosis status post CABG. Atherosclerotic nonaneurysmal thoracic aorta. Top-normal caliber pulmonary arteries (main pulmonary artery diameter 3.2 cm). Mediastinum/Nodes: No discrete thyroid nodules. Unremarkable esophagus. No pathologically enlarged axillary lymph nodes. Right paratracheal adenopathy measuring up to 1.2 cm (series 201/ image 47). Enlarged 1.4 cm subcarinal node (series 201/ image 60). AP window adenopathy measuring up to 1.0 cm (series 201/ image 49). Additional pathologically enlarged mediastinal or gross hilar nodes on this noncontrast scan. Lungs/Pleura: No pneumothorax. No pleural effusion. Mild centrilobular and paraseptal emphysema with diffuse bronchial wall  thickening. Anterior right upper lobe 7 mm solid pulmonary nodule (series 205/ image 44). No acute consolidative airspace disease, lung masses or additional significant pulmonary nodules. There is patchy ground-glass attenuation and reticulation involving the subpleural greater than peribronchovascular lungs, without a clear basilar gradient. There is a suggestion of associated mild traction bronchiolectasis and mild architectural distortion, with no frank honeycombing. There is mild interlobular septal thickening throughout both lungs. Upper abdomen: Unremarkable. Musculoskeletal: No aggressive appearing focal osseous lesions. Stable discontinuities in the 3 most superior sternotomy wires. Additional sternotomy wires appear intact . Subacute healing left posterior second through seventh rib fractures. Moderate thoracic spondylosis. IMPRESSION: 1. Cardiomegaly with complex spectrum of pulmonary parenchymal findings suggestive of mild pulmonary edema superimposed on a fibrotic interstitial lung disease. Given the absence of frank honeycombing and the absence of a clear basilar gradient, the suspected underlying interstitial lung disease is most compatible with nonspecific interstitial pneumonia (NSIP), however early usual interstitial pneumonia (UIP) cannot be excluded and a follow-up high-resolution chest CT is warranted in 6-12 months. 2. Solid 7 mm right upper lobe pulmonary nodule. Non-contrast chest CT at 6-12 months is recommended. If the nodule is stable at time of repeat CT, then future CT at 18-24 months (from today's scan) is considered optional for low-risk patients, but is recommended for high-risk patients. This recommendation follows the consensus statement: Guidelines for Management of Incidental Pulmonary Nodules Detected on CT Images: From the Fleischner Society 2017; Radiology 2017; 284:228-243. 3. Nonspecific mild mediastinal lymphadenopathy, more likely reactive. 4. Aortic atherosclerosis. Left  main and 3 vessel coronary atherosclerosis status post CABG. 5. Mild emphysema and diffuse bronchial wall thickening, suggesting COPD. 6. Subacute healing posterior left second through seventh rib fractures. No pneumothorax. No pleural effusions. Electronically Signed   By: Delbert Phenix M.D.   On: 02/09/2017 16:36   Ct Angio Chest Pe W Or Wo Contrast  Result Date: 02/25/2017 CLINICAL DATA:  61 year old male with hypoxia and tachycardia. EXAM: CT ANGIOGRAPHY CHEST WITH CONTRAST TECHNIQUE: Multidetector CT imaging of the chest was performed using the standard protocol during bolus administration of intravenous contrast. Multiplanar CT image reconstructions and MIPs were obtained to evaluate the vascular anatomy. CONTRAST:  100 cc Isovue 370 COMPARISON:  Chest radiograph dated 02/24/2017 and CT dated 02/09/2017 FINDINGS: Cardiovascular: There mild to moderate cardiomegaly with enlargement of the left ventricle. There is focal area of aneurysmal dilatation with thinning of the myometrium involving the lateral ventricular wall. Multi vessel coronary vascular calcification and CABG surgery noted. There is no pericardial effusion. The thoracic aorta appears unremarkable. There is mild atherosclerotic calcification of the aorta. Evaluation of the pulmonary arteries is limited due to suboptimal opacification of the peripheral branches. No definite central pulmonary artery embolus identified. V/Q scan  may provide better evaluation if there is high clinical concern for acute PE. Mediastinum/Nodes: There is no hilar adenopathy. Top-normal right hilar lymph node. Subcarinal lymph node measures approximately 15 mm in short axis. The esophagus is grossly unremarkable. No mediastinal fluid collection. Lungs/Pleura: There is paraseptal emphysema. There is diffuse interstitial coarsening with mild diffuse bronchial wall thickening. There is ground-glass attenuation of the lung with areas of reticulation involving the subpleural  and peribronchovascular lungs. There is a 7 mm right upper lobe subpleural nodule as seen on the prior CT. No acute consolidative changes noted. There is no pleural effusion or pneumothorax. The central airways are patent. Upper Abdomen: No acute abnormality. Musculoskeletal: Mild degenerate changes of the spine. Median sternotomy wires noted. Left posterior rib fractures as seen on the prior CT, incompletely healed at this time. No acute osseous pathology. Review of the MIP images confirms the above findings. IMPRESSION: 1. No acute intrathoracic pathology. No CT evidence of central pulmonary artery embolus. Evaluation of the peripheral pulmonary arteries is limited due to suboptimal opacification. V/Q scan may provide better evaluation if there is high clinical concern for acute PE. 2. Paraseptal emphysema with findings suggestive of underlying interstitial lung disease. No new consolidative changes. 3. A 7 mm right upper lobe pulmonary nodule. Non-contrast chest CT at 6-12 months is recommended. If the nodule is stable at time of repeat CT, then future CT at 18-24 months (from today's scan) is considered optional for low-risk patients, but is recommended for high-risk patients. This recommendation follows the consensus statement: Guidelines for Management of Incidental Pulmonary Nodules Detected on CT Images: From the Fleischner Society 2017; Radiology 2017; 284:228-243. 4. Mildly enlarged subcarinal lymph node as seen on the prior study and indeterminate etiology, likely reactive. 5. Atherosclerotic calcification of the aorta with 3 vessel coronary vascular disease and CABG changes. 6. 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 7. Multiple left posterior rib fractures with incomplete healing at this time. No new fractures. Electronically Signed   By: Elgie Collard M.D.   On: 02/25/2017 04:20   TELE: SR, personally reviewed    ASSESSMENT AND PLAN  1. Acute  on chronic systolic and diastolic heart failure             - still has B/L crackles in bilateral base, we will start lasix 20 mg iv BID             - continue lisinopril. Given LV dysfunction, consider consolidate 25mg  BID metoprolol tartrate to 50mg  metoprolol succinate.   2. Tobacco abuse             - CTA negative, CXR suggestive of ILD, critical care consulted- per primary team and critical care  3. CAD s/p CABG x 4 in 1993: continue statin, increase lipitor to 40, LDL 103 - given h/o poor compliance and recent echo showing EF 35%, myoview abnormal, we will plan for a cath on Monday - he will require case manager consult for home meds  4. AAA  5. HTN  6. RUL pulm nodule: seen on CTA of chest, recommended future CT at 18-24 month.    Signed, 1994 MD, [facility] 02/27/2017

## 2017-02-27 NOTE — Progress Notes (Signed)
Pt returned from nuc med, awaiting results. Diet order placed, offered drink and snack. Daily medications given. Family at the bedside. No c/o of CP or shortness of breath.

## 2017-02-27 NOTE — Progress Notes (Signed)
PROGRESS NOTE        PATIENT DETAILS Name: [name] Age: 61 y.o. Sex: male Date of Birth: October 02, 1956 Admit Date: 02/24/2017 Admitting Physician Briscoe Deutscher, MD WUJ:[id number] Mariana Arn, PA-C  Brief Narrative:  Patient is a 61 y.o. male with remote history of CAD status post CABG, CVA with minimal left-sided deficits, long-standing tobacco use who presented to the ED for several months history of exertional dyspnea that has been progressively worsening, further workup suggested acute on chronic systolic CHF with positive Lexiscan. Cardiology and pulmonary following.   Subjective:  Patient in bed, denies any headache, no fever or chills. Currently no chest pain or shortness of breath. No focal weakness.  Assessment/Plan:  Acute hypoxemic respiratory failure due to acute on chronic systolic CHF EF 35%.: Seen by cardiology and pulmonary, good response to IV diuretics suggesting this most likely is CHF. ILD workup in progress-PFTs/autoimmune workup ongoing. Per pulmonary taper of steroids in the next 1 week thereafter outpatient follow-up with Dr. [name]. Continue Lasix, ACE, beta blocker aspirin and statin for CHF.  Eugenie Birks is positive for reversible ischemia per cardiology, per cardiology he will undergo left heart catheterization on 03/01/2017. He is so far 600 mL negative although intake output not currently charted, weight 169 pounds.  Interstitial lung disease: ANA negative but rheumatoid factor was high, previous M.D. discussed the case with Dr. [name] who recommends prednisone taper of in the next 1 week. Dr. [name] will follow patient as outpatient-appointment scheduled for 04/01/17.  Acute on chronic systolic heart failure: as #1 above.[name]  NSVT: Seen on telemetry-ocular and 4/26-continue beta blocker-we will recheck electrolytes including magnesium tomorrow morning.  History of CAD status post CABG in the 1990s: Troponins negative-EKG shows  T-wave inversions in the anterolateral leads-no prior EKGs to compare with.See above  Normocytic anemia: Iron panel consistent with anemia of chronic disease. Continue to monitor. No indication for transfusion.  History of recent CVA: Slight/mild left-sided deficits-continue aspirin.  Tobacco abuse: Counseled, on Chantix.  7mm Right lung nodule: Will require a repeat CT chest in 6-12 months time he is a smoker. Outpt Pulm follow up.    DVT Prophylaxis: Prophylactic Lovenox  Code Status: Full code   Family Communication: Sister at bedside  Disposition Plan: Remain inpatient-requires a few more days of hospitalization prior to discharge.   Antimicrobial agents: Anti-infectives    None     Procedures:  TTE - Left ventricle: The cavity size was mildly dilated. Wall thickness was increased in a pattern of mild LVH. There was focal basal hypertrophy. Systolic function was moderately reduced. The estimated ejection fraction was in the range of 35% to 40%. Diffuse hypokinesis. Akinesis of the basal-midinferior myocardium. Doppler parameters are consistent with abnormal left ventricular relaxation (grade 1 diastolic dysfunction). - Mitral valve: There was mild regurgitation. - Left atrium: The atrium was mildly to moderately dilated. - Right atrium: The atrium was mildly dilated. - Pulmonary arteries: Systolic pressure was moderately increased.   PA peak pressure: 42 mm Hg (S).  Lexiscan positive for reversible ischemia per cardiology.  CT angiogram chest showing 7 mm right-sided lung nodule.   Left heart catheterization.   CONSULTS:  pulmonary/intensive care  Time spent: 25 minutes-Greater than 50% of this time was spent in counseling, explanation of diagnosis, planning of further management, and coordination of care.  MEDICATIONS: Scheduled Meds: .  aspirin  81 mg Oral Daily  . atorvastatin  40 mg Oral q1800  . enoxaparin (LOVENOX) injection  40 mg Subcutaneous Q24H    . feeding supplement (ENSURE ENLIVE)  237 mL Oral BID BM  . ipratropium-albuterol  3 mL Nebulization TID  . lisinopril  2.5 mg Oral Daily  . metoprolol tartrate  25 mg Oral BID  . predniSONE  20 mg Oral Q breakfast  . regadenoson      . sodium chloride flush  3 mL Intravenous Q12H  . [START ON 02/28/2017] varenicline  0.5 mg Oral BID   Followed by  . [START ON 03/04/2017] varenicline  1 mg Oral BID   Continuous Infusions:  PRN Meds:.albuterol, bisacodyl, HYDROcodone-acetaminophen, [DISCONTINUED] ondansetron **OR** ondansetron (ZOFRAN) IV, polyethylene glycol   PHYSICAL EXAM: Vital signs: Vitals:   02/27/17 0939 02/27/17 0941 02/27/17 0942 02/27/17 1205  BP: 112/70 103/72 118/75 99/71  Pulse:      Resp:      Temp:    97.4 F (36.3 C)  TempSrc:    Oral  SpO2:    100%  Weight:      Height:       Filed Weights   02/25/17 0135 02/26/17 0615 02/27/17 0518  Weight: 77.6 kg (171 lb) 76.3 kg (168 lb 3.2 oz) 76.8 kg (169 lb 4.8 oz)   Body mass index is 22.34 kg/m.   Awake Alert, Oriented X 3, No new F.N deficits, Normal affect .AT,PERRAL Supple Neck,No JVD, No cervical lymphadenopathy appriciated.  Symmetrical Chest wall movement, Good air movement bilaterally, CTAB RRR,No Gallops,Rubs or new Murmurs, No Parasternal Heave +ve B.Sounds, Abd Soft, No tenderness, No organomegaly appriciated, No rebound - guarding or rigidity. No Cyanosis, Clubbing or edema, No new Rash or bruise   I have personally reviewed following labs and imaging studies  LABORATORY DATA: CBC:  Recent Labs Lab 02/24/17 1421 02/24/17 1920 02/25/17 0220  WBC 8.0 8.6 8.0  NEUTROABS 4.9 4.9  --   HGB  --  10.3* 9.1*  HCT 32.1* 31.9* 28.5*  MCV 82 81.6 80.7  PLT 364 335 283    Basic Metabolic Panel:  Recent Labs Lab 02/24/17 1421 02/24/17 1920 02/25/17 0220 02/26/17 0400 02/27/17 0502  NA 139 139 136 137 138  K 4.8 4.3 3.6 4.4 4.2  CL 100 105 105 102 101  CO2 24 26 23 28 29   GLUCOSE 88  95 150* 156* 106*  BUN 9 8 10 18  27*  CREATININE 0.83 1.00 0.88 0.84 0.91  CALCIUM 9.0 9.2 8.7* 9.2 9.1  MG  --   --   --   --  2.3    GFR: Estimated Creatinine Clearance: 92.6 mL/min (by C-G formula based on SCr of 0.91 mg/dL).  Liver Function Tests:  Recent Labs Lab 02/24/17 1421  AST 10  ALT 8  ALKPHOS 118*  BILITOT 0.8  PROT 7.8  ALBUMIN 3.7   No results for input(s): LIPASE, AMYLASE in the last 168 hours. No results for input(s): AMMONIA in the last 168 hours.  Coagulation Profile: No results for input(s): INR, PROTIME in the last 168 hours.  Cardiac Enzymes:  Recent Labs Lab 02/25/17 0220 02/25/17 0733  TROPONINI 0.03* <0.03    BNP (last 3 results) No results for input(s): PROBNP in the last 8760 hours.  HbA1C: No results for input(s): HGBA1C in the last 72 hours.  CBG:  Recent Labs Lab 02/25/17 0729 02/26/17 0614 02/27/17 0630  GLUCAP 174* 144* 104*  Lipid Profile:  Recent Labs  02/24/17 1421 02/27/17 0502  CHOL 161 164  HDL 26* 32*  LDLCALC 100* 409*  TRIG 176* 144  CHOLHDL 6.2* 5.1    Thyroid Function Tests: No results for input(s): TSH, T4TOTAL, FREET4, T3FREE, THYROIDAB in the last 72 hours.  Anemia Panel:  Recent Labs  02/25/17 0220  VITAMINB12 726  FOLATE 8.4  FERRITIN 130  TIBC 277  IRON 14*  RETICCTPCT 1.9    Urine analysis:    Component Value Date/Time   COLORURINE YELLOW 02/19/2017 1342   APPEARANCEUR CLEAR 02/19/2017 1342   LABSPEC 1.011 02/19/2017 1342   PHURINE 5.0 02/19/2017 1342   GLUCOSEU NEGATIVE 02/19/2017 1342   HGBUR NEGATIVE 02/19/2017 1342   BILIRUBINUR NEGATIVE 02/19/2017 1342   KETONESUR NEGATIVE 02/19/2017 1342   PROTEINUR NEGATIVE 02/19/2017 1342   NITRITE NEGATIVE 02/19/2017 1342   LEUKOCYTESUR NEGATIVE 02/19/2017 1342    Sepsis Labs: Lactic Acid, Venous    Component Value Date/Time   LATICACIDVEN 1.61 02/19/2017 1433    MICROBIOLOGY: No results found for this or any previous  visit (from the past 240 hour(s)).  RADIOLOGY STUDIES/RESULTS: Dg Chest 2 View  Result Date: 02/27/2017 CLINICAL DATA:  Shortness of Breath EXAM: CHEST  2 VIEW COMPARISON:  Chest radiograph February 24, 2017 and chest CT February 25, 2017 ; earlier chest radiograph May 27, 2004 FINDINGS: The interstitium is diffusely prominent. There is no appreciable airspace consolidation. Heart is borderline enlarged with pulmonary vascularity within normal limits. No adenopathy. Patient is status post coronary artery bypass grafting. There are fractures of the 3 superior-most sternal wires. No adenopathy. No pneumothorax. There are several old, healed rib fractures on the left superiorly. IMPRESSION: Diffuse interstitial prominence. Suspect a degree of chronic congestive heart failure, although there may well be underlying interstitial fibrosis. Both entities may be present concurrently. Stable cardiac prominence. Several sternal wires fractured. Patient is status post coronary artery bypass grafting. No adenopathy. Electronically Signed   By: Bretta Bang III M.D.   On: 02/27/2017 11:47   Dg Chest 2 View  Result Date: 02/24/2017 CLINICAL DATA:  Abnormal EKG. Follow-up chest radiograph requested. Initial encounter. EXAM: CHEST  2 VIEW COMPARISON:  Chest radiograph performed 02/19/2017 FINDINGS: The lungs are well-aerated. Peripheral scarring is noted bilaterally, with diffusely increased interstitial markings, concerning for worsening interstitial lung disease. There is no evidence of pleural effusion or pneumothorax. The heart is borderline enlarged. The patient is status post median sternotomy. There are fractures of the superior most sternal wires. No acute osseous abnormalities are seen. IMPRESSION: 1. Peripheral scarring, with diffusely increased interstitial markings, concerning for worsening interstitial lung disease. Underlying pneumonia cannot be excluded. High-resolution CT is recommended to assess for  interstitial lung disease, when and as deemed clinically appropriate. 2. Borderline cardiomegaly. Electronically Signed   By: Roanna Raider M.D.   On: 02/24/2017 19:40   Dg Chest 2 View  Result Date: 02/19/2017 CLINICAL DATA:  Cough.  Weakness. EXAM: CHEST  2 VIEW COMPARISON:  Chest x-ray 02/09/2017, 05/27/2004 CT 02/09/2017. FINDINGS: Prior CABG. Fractured sternal sutures are noted. Stable cardiomegaly. Diffuse interstitial prominence is again noted. Interstitial prominence has improved slightly suggesting improving interstitial edema and/or pneumonitis. A component chronic interstitial lung disease most likely present. Reference is made to prior CT report of 02/09/2017. No pleural effusion pneumothorax. No acute bony abnormality. IMPRESSION: 1. Prior CABG. Fractured sternal sutures again noted. Stable cardiomegaly. Diffuse interstitial prominence is again noted. Interstitial prominence has improved slightly suggesting improving interstitial edema  and/or pneumonitis. 2. A component of chronic interstitial lung disease most likely present. Reference is made to prior CT report of 02/09/2017 . Electronically Signed   By: Maisie Fus  Register   On: 02/19/2017 13:45   Dg Chest 2 View  Result Date: 02/09/2017 CLINICAL DATA:  61 year old male with cough and shortness of breath for the past month. Hypertension. Post CABG. Smoker. Initial encounter. EXAM: CHEST  2 VIEW COMPARISON:  05/27/2004. FINDINGS: Significant change since prior examination. Diffuse increased lung markings, possibly chronic although not able to exclude superimposed mild pulmonary vascular congestion. Given the significant change, lack of more recent films and patient's history smoking, chest CT with high-resolution imaging may be considered for further delineation. Fracture of the left first through fifth ribs new from prior exam although appearing chronic. No pneumothorax. Post CABG with fractured sternal wires.  Cardiomegaly. IMPRESSION:  Significant change since prior examination. Diffuse increased lung markings, possibly chronic although not able to exclude superimposed mild pulmonary vascular congestion. Given the significant change, lack of more recent films and patient's history of smoking, chest CT with high-resolution imaging may be considered for further delineation. Fracture of the left first through fifth ribs new from prior exam although appearing chronic. Post CABG Cardiomegaly. Electronically Signed   By: Lacy Duverney M.D.   On: 02/09/2017 11:35   Ct Chest Wo Contrast  Result Date: 02/09/2017 CLINICAL DATA:  Generalized weakness. Productive cough. Current smoker. Abnormal chest radiograph. EXAM: CT CHEST WITHOUT CONTRAST TECHNIQUE: Multidetector CT imaging of the chest was performed following the standard protocol without IV contrast. COMPARISON:  Chest radiograph from earlier today. FINDINGS: Cardiovascular: Mild cardiomegaly. No significant pericardial fluid/thickening. Left main, left anterior descending, left circumflex and right coronary atherosclerosis status post CABG. Atherosclerotic nonaneurysmal thoracic aorta. Top-normal caliber pulmonary arteries (main pulmonary artery diameter 3.2 cm). Mediastinum/Nodes: No discrete thyroid nodules. Unremarkable esophagus. No pathologically enlarged axillary lymph nodes. Right paratracheal adenopathy measuring up to 1.2 cm (series 201/ image 47). Enlarged 1.4 cm subcarinal node (series 201/ image 60). AP window adenopathy measuring up to 1.0 cm (series 201/ image 49). Additional pathologically enlarged mediastinal or gross hilar nodes on this noncontrast scan. Lungs/Pleura: No pneumothorax. No pleural effusion. Mild centrilobular and paraseptal emphysema with diffuse bronchial wall thickening. Anterior right upper lobe 7 mm solid pulmonary nodule (series 205/ image 44). No acute consolidative airspace disease, lung masses or additional significant pulmonary nodules. There is patchy  ground-glass attenuation and reticulation involving the subpleural greater than peribronchovascular lungs, without a clear basilar gradient. There is a suggestion of associated mild traction bronchiolectasis and mild architectural distortion, with no frank honeycombing. There is mild interlobular septal thickening throughout both lungs. Upper abdomen: Unremarkable. Musculoskeletal: No aggressive appearing focal osseous lesions. Stable discontinuities in the 3 most superior sternotomy wires. Additional sternotomy wires appear intact . Subacute healing left posterior second through seventh rib fractures. Moderate thoracic spondylosis. IMPRESSION: 1. Cardiomegaly with complex spectrum of pulmonary parenchymal findings suggestive of mild pulmonary edema superimposed on a fibrotic interstitial lung disease. Given the absence of frank honeycombing and the absence of a clear basilar gradient, the suspected underlying interstitial lung disease is most compatible with nonspecific interstitial pneumonia (NSIP), however early usual interstitial pneumonia (UIP) cannot be excluded and a follow-up high-resolution chest CT is warranted in 6-12 months. 2. Solid 7 mm right upper lobe pulmonary nodule. Non-contrast chest CT at 6-12 months is recommended. If the nodule is stable at time of repeat CT, then future CT at 18-24 months (from today's scan)  is considered optional for low-risk patients, but is recommended for high-risk patients. This recommendation follows the consensus statement: Guidelines for Management of Incidental Pulmonary Nodules Detected on CT Images: From the Fleischner Society 2017; Radiology 2017; 284:228-243. 3. Nonspecific mild mediastinal lymphadenopathy, more likely reactive. 4. Aortic atherosclerosis. Left main and 3 vessel coronary atherosclerosis status post CABG. 5. Mild emphysema and diffuse bronchial wall thickening, suggesting COPD. 6. Subacute healing posterior left second through seventh rib  fractures. No pneumothorax. No pleural effusions. Electronically Signed   By: Delbert Phenix M.D.   On: 02/09/2017 16:36   Ct Angio Chest Pe W Or Wo Contrast  Result Date: 02/25/2017 CLINICAL DATA:  61 year old male with hypoxia and tachycardia. EXAM: CT ANGIOGRAPHY CHEST WITH CONTRAST TECHNIQUE: Multidetector CT imaging of the chest was performed using the standard protocol during bolus administration of intravenous contrast. Multiplanar CT image reconstructions and MIPs were obtained to evaluate the vascular anatomy. CONTRAST:  100 cc Isovue 370 COMPARISON:  Chest radiograph dated 02/24/2017 and CT dated 02/09/2017 FINDINGS: Cardiovascular: There mild to moderate cardiomegaly with enlargement of the left ventricle. There is focal area of aneurysmal dilatation with thinning of the myometrium involving the lateral ventricular wall. Multi vessel coronary vascular calcification and CABG surgery noted. There is no pericardial effusion. The thoracic aorta appears unremarkable. There is mild atherosclerotic calcification of the aorta. Evaluation of the pulmonary arteries is limited due to suboptimal opacification of the peripheral branches. No definite central pulmonary artery embolus identified. V/Q scan may provide better evaluation if there is high clinical concern for acute PE. Mediastinum/Nodes: There is no hilar adenopathy. Top-normal right hilar lymph node. Subcarinal lymph node measures approximately 15 mm in short axis. The esophagus is grossly unremarkable. No mediastinal fluid collection. Lungs/Pleura: There is paraseptal emphysema. There is diffuse interstitial coarsening with mild diffuse bronchial wall thickening. There is ground-glass attenuation of the lung with areas of reticulation involving the subpleural and peribronchovascular lungs. There is a 7 mm right upper lobe subpleural nodule as seen on the prior CT. No acute consolidative changes noted. There is no pleural effusion or pneumothorax. The  central airways are patent. Upper Abdomen: No acute abnormality. Musculoskeletal: Mild degenerate changes of the spine. Median sternotomy wires noted. Left posterior rib fractures as seen on the prior CT, incompletely healed at this time. No acute osseous pathology. Review of the MIP images confirms the above findings. IMPRESSION: 1. No acute intrathoracic pathology. No CT evidence of central pulmonary artery embolus. Evaluation of the peripheral pulmonary arteries is limited due to suboptimal opacification. V/Q scan may provide better evaluation if there is high clinical concern for acute PE. 2. Paraseptal emphysema with findings suggestive of underlying interstitial lung disease. No new consolidative changes. 3. A 7 mm right upper lobe pulmonary nodule. Non-contrast chest CT at 6-12 months is recommended. If the nodule is stable at time of repeat CT, then future CT at 18-24 months (from today's scan) is considered optional for low-risk patients, but is recommended for high-risk patients. This recommendation follows the consensus statement: Guidelines for Management of Incidental Pulmonary Nodules Detected on CT Images: From the Fleischner Society 2017; Radiology 2017; 284:228-243. 4. Mildly enlarged subcarinal lymph node as seen on the prior study and indeterminate etiology, likely reactive. 5. Atherosclerotic calcification of the aorta with 3 vessel coronary vascular disease and CABG changes. 6. 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 7. Multiple left posterior rib fractures with incomplete healing at this time. No new fractures.  Electronically Signed   By: Elgie Collard M.D.   On: 02/25/2017 04:20     LOS: 3 days   Signature  Susa Raring M.D on 02/27/2017 at 1:04 PM  Between 7am to 7pm - Pager - [phone] ( page via [email or web address], text pages only, please mention full 10 digit call back number).  After 7pm go to [email or web address] - password [email or web address]

## 2017-02-27 NOTE — Progress Notes (Signed)
Pt taken down to nuc medicine via wheelchair.

## 2017-02-28 LAB — NM MYOCAR MULTI W/SPECT W/WALL MOTION / EF
CHL CUP MPHR: 159 {beats}/min
CSEPED: 0 min
CSEPEDS: 0 s
CSEPEW: 1 METS
Peak HR: 96 {beats}/min
Percent HR: 60 %
Rest HR: 72 {beats}/min

## 2017-02-28 LAB — GLUCOSE, CAPILLARY: GLUCOSE-CAPILLARY: 93 mg/dL (ref 65–99)

## 2017-02-28 MED ORDER — SODIUM CHLORIDE 0.9 % IV SOLN: 250.0000 mL | INTRAVENOUS | Status: DC | PRN

## 2017-02-28 MED ORDER — SODIUM CHLORIDE 0.9 % WEIGHT BASED INFUSION: 1.0000 mL/kg/h | INTRAVENOUS | Status: DC

## 2017-02-28 MED ORDER — SODIUM CHLORIDE 0.9% FLUSH: 3.0000 mL | INTRAVENOUS | Status: DC | PRN

## 2017-02-28 MED ORDER — PREDNISONE 10 MG PO TABS
10.0000 mg | ORAL_TABLET | Freq: Every day | ORAL | Status: DC
  Administered 2017-03-01: 10 mg via ORAL
  Filled 2017-02-28: qty 1

## 2017-02-28 MED ORDER — ASPIRIN 81 MG PO CHEW
81.0000 mg | CHEWABLE_TABLET | ORAL | Status: AC
  Administered 2017-03-01: 81 mg via ORAL
  Filled 2017-02-28: qty 1

## 2017-02-28 MED ORDER — SODIUM CHLORIDE 0.9 % WEIGHT BASED INFUSION
3.0000 mL/kg/h | INTRAVENOUS | Status: DC
  Administered 2017-03-01: 3 mL/kg/h via INTRAVENOUS

## 2017-02-28 MED ORDER — SODIUM CHLORIDE 0.9% FLUSH
3.0000 mL | Freq: Two times a day (BID) | INTRAVENOUS | Status: DC
  Administered 2017-02-28: 3 mL via INTRAVENOUS

## 2017-02-28 NOTE — Progress Notes (Signed)
PROGRESS NOTE        PATIENT DETAILS Name: [name] Age: 61 y.o. Sex: male Date of Birth: 1956/04/02 Admit Date: 02/24/2017 Admitting Physician Briscoe Deutscher, MD EAV:[id number] Mariana Arn, PA-C  Brief Narrative:  Patient is a 61 y.o. male with remote history of CAD status post CABG, CVA with minimal left-sided deficits, long-standing tobacco use who presented to the ED for several months history of exertional dyspnea that has been progressively worsening, further workup suggested acute on chronic systolic CHF with positive Lexiscan. Cardiology and pulmonary following.   Subjective:  Patient in bed, appears comfortable, denies any headache, no chest pain or pressure, no shortness of breath or abdominal pain. No focal weakness..  Assessment/Plan:  Acute hypoxemic respiratory failure due to acute on chronic systolic CHF EF 35%.: Seen by cardiology and pulmonary, good response to IV diuretics suggesting this most likely is CHF. ILD workup in progress-PFTs/autoimmune workup ongoing. Per pulmonary taper off oral steroids which are being tapered, thereafter outpatient follow-up with Dr. [name].   Continue Lasix, ACE, beta blocker aspirin and statin for CHF.  Eugenie Birks is positive for reversible ischemia per cardiology, per cardiology he will undergo left heart catheterization on 03/01/2017. He is so far 1700 mL negative although intake output not currently charted, weight 169 pounds.  Interstitial lung disease: ANA negative but rheumatoid factor was high, previous M.D. discussed the case with Dr. [name] who recommends prednisone taper of in the next 1 week. Dr. [name] will follow patient as outpatient-appointment scheduled for 04/01/17.  Acute on chronic systolic heart failure: as #1 above.[name]  NSVT: Seen on telemetry-ocular and 4/26-continue beta blocker-we will recheck electrolytes including magnesium tomorrow morning.  History of CAD status post CABG in  the 1990s: Troponins negative-EKG shows T-wave inversions in the anterolateral leads-no prior EKGs to compare with.See above  Normocytic anemia: Iron panel consistent with anemia of chronic disease. Continue to monitor. No indication for transfusion.  History of recent CVA: Slight/mild left-sided deficits-continue aspirin.  Tobacco abuse: Counseled, on Chantix.  7mm Right lung nodule: Will require a repeat CT chest in 6-12 months time he is a smoker. Outpt Pulm follow up.    DVT Prophylaxis: Prophylactic Lovenox  Code Status: Full code   Family Communication: Sister at bedside  Disposition Plan: Remain inpatient-requires a few more days of hospitalization prior to discharge.   Antimicrobial agents: Anti-infectives    None     Procedures:  TTE - Left ventricle: The cavity size was mildly dilated. Wall thickness was increased in a pattern of mild LVH. There was focal basal hypertrophy. Systolic function was moderately reduced. The estimated ejection fraction was in the range of 35% to 40%. Diffuse hypokinesis. Akinesis of the basal-midinferior myocardium. Doppler parameters are consistent with abnormal left ventricular relaxation (grade 1 diastolic dysfunction). - Mitral valve: There was mild regurgitation. - Left atrium: The atrium was mildly to moderately dilated. - Right atrium: The atrium was mildly dilated. - Pulmonary arteries: Systolic pressure was moderately increased.   PA peak pressure: 42 mm Hg (S).  Lexiscan positive for reversible ischemia per cardiology.  CT angiogram chest showing 7 mm right-sided lung nodule.   Left heart catheterization.   CONSULTS:  pulmonary/intensive care  Time spent: 25 minutes-Greater than 50% of this time was spent in counseling, explanation of diagnosis, planning of further management, and coordination of care.  MEDICATIONS: Scheduled Meds: . aspirin  81 mg Oral Daily  . atorvastatin  40 mg Oral q1800  . enoxaparin  (LOVENOX) injection  40 mg Subcutaneous Q24H  . feeding supplement (ENSURE ENLIVE)  237 mL Oral BID BM  . ipratropium-albuterol  3 mL Nebulization TID  . lisinopril  2.5 mg Oral Daily  . metoprolol tartrate  25 mg Oral BID  . predniSONE  20 mg Oral Q breakfast  . sodium chloride flush  3 mL Intravenous Q12H  . varenicline  0.5 mg Oral BID   Followed by  . [START ON 03/04/2017] varenicline  1 mg Oral BID   Continuous Infusions:  PRN Meds:.albuterol, bisacodyl, HYDROcodone-acetaminophen, [DISCONTINUED] ondansetron **OR** ondansetron (ZOFRAN) IV, polyethylene glycol   PHYSICAL EXAM: Vital signs: Vitals:   02/27/17 2031 02/27/17 2137 02/28/17 0448 02/28/17 0902  BP: 102/60  109/68   Pulse: 80  83   Resp: 20  20   Temp: 97.8 F (36.6 C)  97.9 F (36.6 C)   TempSrc: Oral  Oral   SpO2: 94% 96% 95% 93%  Weight:   76.9 kg (169 lb 8 oz)   Height:       Filed Weights   02/26/17 0615 02/27/17 0518 02/28/17 0448  Weight: 76.3 kg (168 lb 3.2 oz) 76.8 kg (169 lb 4.8 oz) 76.9 kg (169 lb 8 oz)   Body mass index is 22.36 kg/m.   Awake Alert, Oriented X 3, No new F.N deficits, Normal affect [place].AT,PERRAL Supple Neck,No JVD, No cervical lymphadenopathy appriciated.  Symmetrical Chest wall movement, Good air movement bilaterally, CTAB RRR,No Gallops,Rubs or new Murmurs, No Parasternal Heave +ve B.Sounds, Abd Soft, No tenderness, No organomegaly appriciated, No rebound - guarding or rigidity. No Cyanosis, Clubbing or edema, No new Rash or bruise  I have personally reviewed following labs and imaging studies  LABORATORY DATA: CBC:  Recent Labs Lab 02/24/17 1421 02/24/17 1920 02/25/17 0220  WBC 8.0 8.6 8.0  NEUTROABS 4.9 4.9  --   HGB  --  10.3* 9.1*  HCT 32.1* 31.9* 28.5*  MCV 82 81.6 80.7  PLT 364 335 283    Basic Metabolic Panel:  Recent Labs Lab 02/24/17 1421 02/24/17 1920 02/25/17 0220 02/26/17 0400 02/27/17 0502  NA 139 139 136 137 138  K 4.8 4.3 3.6 4.4 4.2  CL  100 105 105 102 101  CO2 24 26 23 28 29   GLUCOSE 88 95 150* 156* 106*  BUN 9 8 10 18  27*  CREATININE 0.83 1.00 0.88 0.84 0.91  CALCIUM 9.0 9.2 8.7* 9.2 9.1  MG  --   --   --   --  2.3    GFR: Estimated Creatinine Clearance: 92.7 mL/min (by C-G formula based on SCr of 0.91 mg/dL).  Liver Function Tests:  Recent Labs Lab 02/24/17 1421  AST 10  ALT 8  ALKPHOS 118*  BILITOT 0.8  PROT 7.8  ALBUMIN 3.7   No results for input(s): LIPASE, AMYLASE in the last 168 hours. No results for input(s): AMMONIA in the last 168 hours.  Coagulation Profile: No results for input(s): INR, PROTIME in the last 168 hours.  Cardiac Enzymes:  Recent Labs Lab 02/25/17 0220 02/25/17 0733  TROPONINI 0.03* <0.03    BNP (last 3 results) No results for input(s): PROBNP in the last 8760 hours.  HbA1C: No results for input(s): HGBA1C in the last 72 hours.  CBG:  Recent Labs Lab 02/25/17 0729 02/26/17 0614 02/27/17 0630 02/28/17 0554  GLUCAP 174* 144*  104* 93    Lipid Profile:  Recent Labs  02/27/17 0502  CHOL 164  HDL 32*  LDLCALC 103*  TRIG 144  CHOLHDL 5.1    Thyroid Function Tests: No results for input(s): TSH, T4TOTAL, FREET4, T3FREE, THYROIDAB in the last 72 hours.  Anemia Panel: No results for input(s): VITAMINB12, FOLATE, FERRITIN, TIBC, IRON, RETICCTPCT in the last 72 hours.  Urine analysis:    Component Value Date/Time   COLORURINE YELLOW 02/19/2017 1342   APPEARANCEUR CLEAR 02/19/2017 1342   LABSPEC 1.011 02/19/2017 1342   PHURINE 5.0 02/19/2017 1342   GLUCOSEU NEGATIVE 02/19/2017 1342   HGBUR NEGATIVE 02/19/2017 1342   BILIRUBINUR NEGATIVE 02/19/2017 1342   KETONESUR NEGATIVE 02/19/2017 1342   PROTEINUR NEGATIVE 02/19/2017 1342   NITRITE NEGATIVE 02/19/2017 1342   LEUKOCYTESUR NEGATIVE 02/19/2017 1342    Sepsis Labs: Lactic Acid, Venous    Component Value Date/Time   LATICACIDVEN 1.61 02/19/2017 1433    MICROBIOLOGY: No results found for this  or any previous visit (from the past 240 hour(s)).  RADIOLOGY STUDIES/RESULTS: Dg Chest 2 View  Result Date: 02/27/2017 CLINICAL DATA:  Shortness of Breath EXAM: CHEST  2 VIEW COMPARISON:  Chest radiograph February 24, 2017 and chest CT February 25, 2017 ; earlier chest radiograph May 27, 2004 FINDINGS: The interstitium is diffusely prominent. There is no appreciable airspace consolidation. Heart is borderline enlarged with pulmonary vascularity within normal limits. No adenopathy. Patient is status post coronary artery bypass grafting. There are fractures of the 3 superior-most sternal wires. No adenopathy. No pneumothorax. There are several old, healed rib fractures on the left superiorly. IMPRESSION: Diffuse interstitial prominence. Suspect a degree of chronic congestive heart failure, although there may well be underlying interstitial fibrosis. Both entities may be present concurrently. Stable cardiac prominence. Several sternal wires fractured. Patient is status post coronary artery bypass grafting. No adenopathy. Electronically Signed   By: Bretta Bang III M.D.   On: 02/27/2017 11:47   Dg Chest 2 View  Result Date: 02/24/2017 CLINICAL DATA:  Abnormal EKG. Follow-up chest radiograph requested. Initial encounter. EXAM: CHEST  2 VIEW COMPARISON:  Chest radiograph performed 02/19/2017 FINDINGS: The lungs are well-aerated. Peripheral scarring is noted bilaterally, with diffusely increased interstitial markings, concerning for worsening interstitial lung disease. There is no evidence of pleural effusion or pneumothorax. The heart is borderline enlarged. The patient is status post median sternotomy. There are fractures of the superior most sternal wires. No acute osseous abnormalities are seen. IMPRESSION: 1. Peripheral scarring, with diffusely increased interstitial markings, concerning for worsening interstitial lung disease. Underlying pneumonia cannot be excluded. High-resolution CT is recommended to  assess for interstitial lung disease, when and as deemed clinically appropriate. 2. Borderline cardiomegaly. Electronically Signed   By: Roanna Raider M.D.   On: 02/24/2017 19:40   Dg Chest 2 View  Result Date: 02/19/2017 CLINICAL DATA:  Cough.  Weakness. EXAM: CHEST  2 VIEW COMPARISON:  Chest x-ray 02/09/2017, 05/27/2004 CT 02/09/2017. FINDINGS: Prior CABG. Fractured sternal sutures are noted. Stable cardiomegaly. Diffuse interstitial prominence is again noted. Interstitial prominence has improved slightly suggesting improving interstitial edema and/or pneumonitis. A component chronic interstitial lung disease most likely present. Reference is made to prior CT report of 02/09/2017. No pleural effusion pneumothorax. No acute bony abnormality. IMPRESSION: 1. Prior CABG. Fractured sternal sutures again noted. Stable cardiomegaly. Diffuse interstitial prominence is again noted. Interstitial prominence has improved slightly suggesting improving interstitial edema and/or pneumonitis. 2. A component of chronic interstitial lung disease most likely present.  Reference is made to prior CT report of 02/09/2017 . Electronically Signed   By: Maisie Fus  Register   On: 02/19/2017 13:45   Dg Chest 2 View  Result Date: 02/09/2017 CLINICAL DATA:  62 year old male with cough and shortness of breath for the past month. Hypertension. Post CABG. Smoker. Initial encounter. EXAM: CHEST  2 VIEW COMPARISON:  05/27/2004. FINDINGS: Significant change since prior examination. Diffuse increased lung markings, possibly chronic although not able to exclude superimposed mild pulmonary vascular congestion. Given the significant change, lack of more recent films and patient's history smoking, chest CT with high-resolution imaging may be considered for further delineation. Fracture of the left first through fifth ribs new from prior exam although appearing chronic. No pneumothorax. Post CABG with fractured sternal wires.  Cardiomegaly.  IMPRESSION: Significant change since prior examination. Diffuse increased lung markings, possibly chronic although not able to exclude superimposed mild pulmonary vascular congestion. Given the significant change, lack of more recent films and patient's history of smoking, chest CT with high-resolution imaging may be considered for further delineation. Fracture of the left first through fifth ribs new from prior exam although appearing chronic. Post CABG Cardiomegaly. Electronically Signed   By: Lacy Duverney M.D.   On: 02/09/2017 11:35   Ct Chest Wo Contrast  Result Date: 02/09/2017 CLINICAL DATA:  Generalized weakness. Productive cough. Current smoker. Abnormal chest radiograph. EXAM: CT CHEST WITHOUT CONTRAST TECHNIQUE: Multidetector CT imaging of the chest was performed following the standard protocol without IV contrast. COMPARISON:  Chest radiograph from earlier today. FINDINGS: Cardiovascular: Mild cardiomegaly. No significant pericardial fluid/thickening. Left main, left anterior descending, left circumflex and right coronary atherosclerosis status post CABG. Atherosclerotic nonaneurysmal thoracic aorta. Top-normal caliber pulmonary arteries (main pulmonary artery diameter 3.2 cm). Mediastinum/Nodes: No discrete thyroid nodules. Unremarkable esophagus. No pathologically enlarged axillary lymph nodes. Right paratracheal adenopathy measuring up to 1.2 cm (series 201/ image 47). Enlarged 1.4 cm subcarinal node (series 201/ image 60). AP window adenopathy measuring up to 1.0 cm (series 201/ image 49). Additional pathologically enlarged mediastinal or gross hilar nodes on this noncontrast scan. Lungs/Pleura: No pneumothorax. No pleural effusion. Mild centrilobular and paraseptal emphysema with diffuse bronchial wall thickening. Anterior right upper lobe 7 mm solid pulmonary nodule (series 205/ image 44). No acute consolidative airspace disease, lung masses or additional significant pulmonary nodules. There is  patchy ground-glass attenuation and reticulation involving the subpleural greater than peribronchovascular lungs, without a clear basilar gradient. There is a suggestion of associated mild traction bronchiolectasis and mild architectural distortion, with no frank honeycombing. There is mild interlobular septal thickening throughout both lungs. Upper abdomen: Unremarkable. Musculoskeletal: No aggressive appearing focal osseous lesions. Stable discontinuities in the 3 most superior sternotomy wires. Additional sternotomy wires appear intact . Subacute healing left posterior second through seventh rib fractures. Moderate thoracic spondylosis. IMPRESSION: 1. Cardiomegaly with complex spectrum of pulmonary parenchymal findings suggestive of mild pulmonary edema superimposed on a fibrotic interstitial lung disease. Given the absence of frank honeycombing and the absence of a clear basilar gradient, the suspected underlying interstitial lung disease is most compatible with nonspecific interstitial pneumonia (NSIP), however early usual interstitial pneumonia (UIP) cannot be excluded and a follow-up high-resolution chest CT is warranted in 6-12 months. 2. Solid 7 mm right upper lobe pulmonary nodule. Non-contrast chest CT at 6-12 months is recommended. If the nodule is stable at time of repeat CT, then future CT at 18-24 months (from today's scan) is considered optional for low-risk patients, but is recommended for high-risk patients. This  recommendation follows the consensus statement: Guidelines for Management of Incidental Pulmonary Nodules Detected on CT Images: From the Fleischner Society 2017; Radiology 2017; 284:228-243. 3. Nonspecific mild mediastinal lymphadenopathy, more likely reactive. 4. Aortic atherosclerosis. Left main and 3 vessel coronary atherosclerosis status post CABG. 5. Mild emphysema and diffuse bronchial wall thickening, suggesting COPD. 6. Subacute healing posterior left second through seventh rib  fractures. No pneumothorax. No pleural effusions. Electronically Signed   By: Delbert Phenix M.D.   On: 02/09/2017 16:36   Ct Angio Chest Pe W Or Wo Contrast  Result Date: 02/25/2017 CLINICAL DATA:  61 year old male with hypoxia and tachycardia. EXAM: CT ANGIOGRAPHY CHEST WITH CONTRAST TECHNIQUE: Multidetector CT imaging of the chest was performed using the standard protocol during bolus administration of intravenous contrast. Multiplanar CT image reconstructions and MIPs were obtained to evaluate the vascular anatomy. CONTRAST:  100 cc Isovue 370 COMPARISON:  Chest radiograph dated 02/24/2017 and CT dated 02/09/2017 FINDINGS: Cardiovascular: There mild to moderate cardiomegaly with enlargement of the left ventricle. There is focal area of aneurysmal dilatation with thinning of the myometrium involving the lateral ventricular wall. Multi vessel coronary vascular calcification and CABG surgery noted. There is no pericardial effusion. The thoracic aorta appears unremarkable. There is mild atherosclerotic calcification of the aorta. Evaluation of the pulmonary arteries is limited due to suboptimal opacification of the peripheral branches. No definite central pulmonary artery embolus identified. V/Q scan may provide better evaluation if there is high clinical concern for acute PE. Mediastinum/Nodes: There is no hilar adenopathy. Top-normal right hilar lymph node. Subcarinal lymph node measures approximately 15 mm in short axis. The esophagus is grossly unremarkable. No mediastinal fluid collection. Lungs/Pleura: There is paraseptal emphysema. There is diffuse interstitial coarsening with mild diffuse bronchial wall thickening. There is ground-glass attenuation of the lung with areas of reticulation involving the subpleural and peribronchovascular lungs. There is a 7 mm right upper lobe subpleural nodule as seen on the prior CT. No acute consolidative changes noted. There is no pleural effusion or pneumothorax. The  central airways are patent. Upper Abdomen: No acute abnormality. Musculoskeletal: Mild degenerate changes of the spine. Median sternotomy wires noted. Left posterior rib fractures as seen on the prior CT, incompletely healed at this time. No acute osseous pathology. Review of the MIP images confirms the above findings. IMPRESSION: 1. No acute intrathoracic pathology. No CT evidence of central pulmonary artery embolus. Evaluation of the peripheral pulmonary arteries is limited due to suboptimal opacification. V/Q scan may provide better evaluation if there is high clinical concern for acute PE. 2. Paraseptal emphysema with findings suggestive of underlying interstitial lung disease. No new consolidative changes. 3. A 7 mm right upper lobe pulmonary nodule. Non-contrast chest CT at 6-12 months is recommended. If the nodule is stable at time of repeat CT, then future CT at 18-24 months (from today's scan) is considered optional for low-risk patients, but is recommended for high-risk patients. This recommendation follows the consensus statement: Guidelines for Management of Incidental Pulmonary Nodules Detected on CT Images: From the Fleischner Society 2017; Radiology 2017; 284:228-243. 4. Mildly enlarged subcarinal lymph node as seen on the prior study and indeterminate etiology, likely reactive. 5. Atherosclerotic calcification of the aorta with 3 vessel coronary vascular disease and CABG changes. 6. 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 7. Multiple left posterior rib fractures with incomplete healing at this time. No new fractures. Electronically Signed   By: Elgie Collard M.D.   On: 02/25/2017  04:20     LOS: 4 days   Signature  Susa Raring M.D on 02/28/2017 at 10:31 AM  Between 7am to 7pm - Pager - [phone] ( page via [email or web address], text pages only, please mention full 10 digit call back number).  After 7pm go to [email or web address] - password [email or web address]

## 2017-02-28 NOTE — Progress Notes (Addendum)
  Patient Name: [name] Date of Encounter: 02/28/2017  Principal Problem:   Interstitial lung disease (HCC) Active Problems:   CAD (coronary artery disease)   Normocytic anemia   AAA (abdominal aortic aneurysm) (HCC)   Abnormal EKG   Acute respiratory failure with hypoxia (HCC)   History of stroke   Essential hypertension   Current smoker   Exertional shortness of breath   Nodule of right lung   Length of Stay: 4  SUBJECTIVE  He feels better and denies chest pain or SOB.   CURRENT MEDS . aspirin  81 mg Oral Daily  . atorvastatin  40 mg Oral q1800  . enoxaparin (LOVENOX) injection  40 mg Subcutaneous Q24H  . feeding supplement (ENSURE ENLIVE)  237 mL Oral BID BM  . ipratropium-albuterol  3 mL Nebulization TID  . lisinopril  2.5 mg Oral Daily  . metoprolol tartrate  25 mg Oral BID  . predniSONE  20 mg Oral Q breakfast  . sodium chloride flush  3 mL Intravenous Q12H  . varenicline  0.5 mg Oral BID   Followed by  . [START ON 03/04/2017] varenicline  1 mg Oral BID    OBJECTIVE  Vitals:   02/27/17 2031 02/27/17 2137 02/28/17 0448 02/28/17 0902  BP: 102/60  109/68   Pulse: 80  83   Resp: 20  20   Temp: 97.8 F (36.6 C)  97.9 F (36.6 C)   TempSrc: Oral  Oral   SpO2: 94% 96% 95% 93%  Weight:   169 lb 8 oz (76.9 kg)   Height:        Intake/Output Summary (Last 24 hours) at 02/28/17 0921 Last data filed at 02/28/17 0909  Gross per 24 hour  Intake             1200 ml  Output             2350 ml  Net            -1150 ml   Filed Weights   02/26/17 0615 02/27/17 0518 02/28/17 0448  Weight: 168 lb 3.2 oz (76.3 kg) 169 lb 4.8 oz (76.8 kg) 169 lb 8 oz (76.9 kg)    PHYSICAL EXAM  General: Pleasant, NAD. Neuro: Alert and oriented X 3. Moves all extremities spontaneously. Psych: Normal affect. HEENT:  Normal  Neck: Supple without bruits or JVD. Lungs:  Resp regular and unlabored, wheezing B/L. Heart: RRR no s3, s4, or murmurs. Abdomen: Soft, non-tender,  non-distended, BS + x 4.  Extremities: No clubbing, cyanosis or edema. DP/PT/Radials 2+ and equal bilaterally.  Accessory Clinical Findings  CBC No results for input(s): WBC, NEUTROABS, HGB, HCT, MCV, PLT in the last 72 hours. Basic Metabolic Panel  Recent Labs  02/26/17 0400 02/27/17 0502  NA 137 138  K 4.4 4.2  CL 102 101  CO2 28 29  GLUCOSE 156* 106*  BUN 18 27*  CREATININE 0.84 0.91  CALCIUM 9.2 9.1  MG  --  2.3   Liver Function Tests No results for input(s): AST, ALT, ALKPHOS, BILITOT, PROT, ALBUMIN in the last 72 hours. No results for input(s): LIPASE, AMYLASE in the last 72 hours. Cardiac Enzymes No results for input(s): CKTOTAL, CKMB, CKMBINDEX, TROPONINI in the last 72 hours. BNP Invalid input(s): POCBNP D-Dimer No results for input(s): DDIMER in the last 72 hours. Hemoglobin A1C No results for input(s): HGBA1C in the last 72 hours. Fasting Lipid Panel  Recent Labs  02/27/17 0502  CHOL   164  HDL 32*  LDLCALC 103*  TRIG 144  CHOLHDL 5.1   Thyroid Function Tests No results for input(s): TSH, T4TOTAL, T3FREE, THYROIDAB in the last 72 hours.  Invalid input(s): FREET3  Radiology/Studies  Dg Chest 2 View  Result Date: 02/27/2017 CLINICAL DATA:  Shortness of Breath EXAM: CHEST  2 VIEW COMPARISON:  Chest radiograph February 24, 2017 and chest CT February 25, 2017 ; earlier chest radiograph May 27, 2004 FINDINGS: The interstitium is diffusely prominent. There is no appreciable airspace consolidation. Heart is borderline enlarged with pulmonary vascularity within normal limits. No adenopathy. Patient is status post coronary artery bypass grafting. There are fractures of the 3 superior-most sternal wires. No adenopathy. No pneumothorax. There are several old, healed rib fractures on the left superiorly. IMPRESSION: Diffuse interstitial prominence. Suspect a degree of chronic congestive heart failure, although there may well be underlying interstitial fibrosis. Both  entities may be present concurrently. Stable cardiac prominence. Several sternal wires fractured. Patient is status post coronary artery bypass grafting. No adenopathy. Electronically Signed   By: William  Woodruff III M.D.   On: 02/27/2017 11:47   Dg Chest 2 View  Result Date: 02/24/2017 CLINICAL DATA:  Abnormal EKG. Follow-up chest radiograph requested. Initial encounter. EXAM: CHEST  2 VIEW COMPARISON:  Chest radiograph performed 02/19/2017 FINDINGS: The lungs are well-aerated. Peripheral scarring is noted bilaterally, with diffusely increased interstitial markings, concerning for worsening interstitial lung disease. There is no evidence of pleural effusion or pneumothorax. The heart is borderline enlarged. The patient is status post median sternotomy. There are fractures of the superior most sternal wires. No acute osseous abnormalities are seen. IMPRESSION: 1. Peripheral scarring, with diffusely increased interstitial markings, concerning for worsening interstitial lung disease. Underlying pneumonia cannot be excluded. High-resolution CT is recommended to assess for interstitial lung disease, when and as deemed clinically appropriate. 2. Borderline cardiomegaly. Electronically Signed   By: Jeffery  [name] M.D.   On: 02/24/2017 19:40   Dg Chest 2 View  Result Date: 02/19/2017 CLINICAL DATA:  Cough.  Weakness. EXAM: CHEST  2 VIEW COMPARISON:  Chest x-ray 02/09/2017, 05/27/2004 CT 02/09/2017. FINDINGS: Prior CABG. Fractured sternal sutures are noted. Stable cardiomegaly. Diffuse interstitial prominence is again noted. Interstitial prominence has improved slightly suggesting improving interstitial edema and/or pneumonitis. A component chronic interstitial lung disease most likely present. Reference is made to prior CT report of 02/09/2017. No pleural effusion pneumothorax. No acute bony abnormality. IMPRESSION: 1. Prior CABG. Fractured sternal sutures again noted. Stable cardiomegaly. Diffuse interstitial  prominence is again noted. Interstitial prominence has improved slightly suggesting improving interstitial edema and/or pneumonitis. 2. A component of chronic interstitial lung disease most likely present. Reference is made to prior CT report of 02/09/2017 . Electronically Signed   By: Thomas  Register   On: 02/19/2017 13:45   Dg Chest 2 View  Result Date: 02/09/2017 CLINICAL DATA:  60-year-old male with cough and shortness of breath for the past month. Hypertension. Post CABG. Smoker. Initial encounter. EXAM: CHEST  2 VIEW COMPARISON:  05/27/2004. FINDINGS: Significant change since prior examination. Diffuse increased lung markings, possibly chronic although not able to exclude superimposed mild pulmonary vascular congestion. Given the significant change, lack of more recent films and patient's history smoking, chest CT with high-resolution imaging may be considered for further delineation. Fracture of the left first through fifth ribs new from prior exam although appearing chronic. No pneumothorax. Post CABG with fractured sternal wires.  Cardiomegaly. IMPRESSION: Significant change since prior examination. Diffuse increased lung markings, possibly   chronic although not able to exclude superimposed mild pulmonary vascular congestion. Given the significant change, lack of more recent films and patient's history of smoking, chest CT with high-resolution imaging may be considered for further delineation. Fracture of the left first through fifth ribs new from prior exam although appearing chronic. Post CABG Cardiomegaly. Electronically Signed   By: Steven  Olson M.D.   On: 02/09/2017 11:35   Ct Chest Wo Contrast  Result Date: 02/09/2017 CLINICAL DATA:  Generalized weakness. Productive cough. Current smoker. Abnormal chest radiograph. EXAM: CT CHEST WITHOUT CONTRAST TECHNIQUE: Multidetector CT imaging of the chest was performed following the standard protocol without IV contrast. COMPARISON:  Chest radiograph  from earlier today. FINDINGS: Cardiovascular: Mild cardiomegaly. No significant pericardial fluid/thickening. Left main, left anterior descending, left circumflex and right coronary atherosclerosis status post CABG. Atherosclerotic nonaneurysmal thoracic aorta. Top-normal caliber pulmonary arteries (main pulmonary artery diameter 3.2 cm). Mediastinum/Nodes: No discrete thyroid nodules. Unremarkable esophagus. No pathologically enlarged axillary lymph nodes. Right paratracheal adenopathy measuring up to 1.2 cm (series 201/ image 47). Enlarged 1.4 cm subcarinal node (series 201/ image 60). AP window adenopathy measuring up to 1.0 cm (series 201/ image 49). Additional pathologically enlarged mediastinal or gross hilar nodes on this noncontrast scan. Lungs/Pleura: No pneumothorax. No pleural effusion. Mild centrilobular and paraseptal emphysema with diffuse bronchial wall thickening. Anterior right upper lobe 7 mm solid pulmonary nodule (series 205/ image 44). No acute consolidative airspace disease, lung masses or additional significant pulmonary nodules. There is patchy ground-glass attenuation and reticulation involving the subpleural greater than peribronchovascular lungs, without a clear basilar gradient. There is a suggestion of associated mild traction bronchiolectasis and mild architectural distortion, with no frank honeycombing. There is mild interlobular septal thickening throughout both lungs. Upper abdomen: Unremarkable. Musculoskeletal: No aggressive appearing focal osseous lesions. Stable discontinuities in the 3 most superior sternotomy wires. Additional sternotomy wires appear intact . Subacute healing left posterior second through seventh rib fractures. Moderate thoracic spondylosis. IMPRESSION: 1. Cardiomegaly with complex spectrum of pulmonary parenchymal findings suggestive of mild pulmonary edema superimposed on a fibrotic interstitial lung disease. Given the absence of frank honeycombing and the  absence of a clear basilar gradient, the suspected underlying interstitial lung disease is most compatible with nonspecific interstitial pneumonia (NSIP), however early usual interstitial pneumonia (UIP) cannot be excluded and a follow-up high-resolution chest CT is warranted in 6-12 months. 2. Solid 7 mm right upper lobe pulmonary nodule. Non-contrast chest CT at 6-12 months is recommended. If the nodule is stable at time of repeat CT, then future CT at 18-24 months (from today's scan) is considered optional for low-risk patients, but is recommended for high-risk patients. This recommendation follows the consensus statement: Guidelines for Management of Incidental Pulmonary Nodules Detected on CT Images: From the Fleischner Society 2017; Radiology 2017; 284:228-243. 3. Nonspecific mild mediastinal lymphadenopathy, more likely reactive. 4. Aortic atherosclerosis. Left main and 3 vessel coronary atherosclerosis status post CABG. 5. Mild emphysema and diffuse bronchial wall thickening, suggesting COPD. 6. Subacute healing posterior left second through seventh rib fractures. No pneumothorax. No pleural effusions. Electronically Signed   By: Jason A Poff M.D.   On: 02/09/2017 16:36   Ct Angio Chest Pe W Or Wo Contrast  Result Date: 02/25/2017 CLINICAL DATA:  60-year-old male with hypoxia and tachycardia. EXAM: CT ANGIOGRAPHY CHEST WITH CONTRAST TECHNIQUE: Multidetector CT imaging of the chest was performed using the standard protocol during bolus administration of intravenous contrast. Multiplanar CT image reconstructions and MIPs were obtained to evaluate the vascular   anatomy. CONTRAST:  100 cc Isovue 370 COMPARISON:  Chest radiograph dated 02/24/2017 and CT dated 02/09/2017 FINDINGS: Cardiovascular: There mild to moderate cardiomegaly with enlargement of the left ventricle. There is focal area of aneurysmal dilatation with thinning of the myometrium involving the lateral ventricular wall. Multi vessel coronary  vascular calcification and CABG surgery noted. There is no pericardial effusion. The thoracic aorta appears unremarkable. There is mild atherosclerotic calcification of the aorta. Evaluation of the pulmonary arteries is limited due to suboptimal opacification of the peripheral branches. No definite central pulmonary artery embolus identified. V/Q scan may provide better evaluation if there is high clinical concern for acute PE. Mediastinum/Nodes: There is no hilar adenopathy. Top-normal right hilar lymph node. Subcarinal lymph node measures approximately 15 mm in short axis. The esophagus is grossly unremarkable. No mediastinal fluid collection. Lungs/Pleura: There is paraseptal emphysema. There is diffuse interstitial coarsening with mild diffuse bronchial wall thickening. There is ground-glass attenuation of the lung with areas of reticulation involving the subpleural and peribronchovascular lungs. There is a 7 mm right upper lobe subpleural nodule as seen on the prior CT. No acute consolidative changes noted. There is no pleural effusion or pneumothorax. The central airways are patent. Upper Abdomen: No acute abnormality. Musculoskeletal: Mild degenerate changes of the spine. Median sternotomy wires noted. Left posterior rib fractures as seen on the prior CT, incompletely healed at this time. No acute osseous pathology. Review of the MIP images confirms the above findings. IMPRESSION: 1. No acute intrathoracic pathology. No CT evidence of central pulmonary artery embolus. Evaluation of the peripheral pulmonary arteries is limited due to suboptimal opacification. V/Q scan may provide better evaluation if there is high clinical concern for acute PE. 2. Paraseptal emphysema with findings suggestive of underlying interstitial lung disease. No new consolidative changes. 3. A 7 mm right upper lobe pulmonary nodule. Non-contrast chest CT at 6-12 months is recommended. If the nodule is stable at time of repeat CT, then  future CT at 18-24 months (from today's scan) is considered optional for low-risk patients, but is recommended for high-risk patients. This recommendation follows the consensus statement: Guidelines for Management of Incidental Pulmonary Nodules Detected on CT Images: From the Fleischner Society 2017; Radiology 2017; 284:228-243. 4. Mildly enlarged subcarinal lymph node as seen on the prior study and indeterminate etiology, likely reactive. 5. Atherosclerotic calcification of the aorta with 3 vessel coronary vascular disease and CABG changes. 6. 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 7. Multiple left posterior rib fractures with incomplete healing at this time. No new fractures. Electronically Signed   By: Arash  Radparvar M.D.   On: 02/25/2017 04:20   TELE: SR, personally reviewed    ASSESSMENT AND PLAN  1. Acute on chronic systolic and diastolic heart failure             - still has B/L crackles in bilateral base, we will start lasix 20 mg iv BID             - continue lisinopril, metoprolol.   2. Tobacco abuse             - CTA negative, CXR suggestive of ILD, critical care consulted- per primary team and critical care  - he is wheezing and getting breathing treatment.   - started on Chantix  3. CAD s/p CABG x 4 in 1993: continue statin, increase lipitor to 40, LDL 103 - given h/o poor compliance and recent echo showing EF 35%, myoview   abnormal, we will plan for a cath on Monday - he will require case manager consult for home meds  4. AAA  5. HTN  6. RUL pulm nodule: seen on CTA of chest, recommended future CT at 18-24 month.    Signed, Mikkel Charrette MD, FACC 02/28/2017  

## 2017-02-28 NOTE — Progress Notes (Signed)
Risk and benefit of cardiac catheterization explained to the patient who display clear understanding and agree to proceed.  Discussed with patient possible procedural risk include bleeding, vascular injury, renal injury, arrythmia, MI, stroke and loss of limb or life.  Signed, Cruzito Standre PA Pager: [phone]  

## 2017-03-01 ENCOUNTER — Encounter (HOSPITAL_COMMUNITY)
Admission: EM | Disposition: A | Discharge status: Home / Self Care | Payer: Self-pay | Source: Home / Self Care | Attending: Internal Medicine

## 2017-03-01 ENCOUNTER — Encounter (HOSPITAL_COMMUNITY): Payer: Self-pay | Admitting: Interventional Cardiology

## 2017-03-01 DIAGNOSIS — E7800 Pure hypercholesterolemia, unspecified: Secondary | ICD-10-CM

## 2017-03-01 DIAGNOSIS — Z951 Presence of aortocoronary bypass graft: Secondary | ICD-10-CM

## 2017-03-01 DIAGNOSIS — I5041 Acute combined systolic (congestive) and diastolic (congestive) heart failure: Secondary | ICD-10-CM

## 2017-03-01 DIAGNOSIS — I714 Abdominal aortic aneurysm, without rupture: Secondary | ICD-10-CM

## 2017-03-01 HISTORY — PX: LEFT HEART CATH AND CORS/GRAFTS ANGIOGRAPHY: CATH118250

## 2017-03-01 LAB — BASIC METABOLIC PANEL
Anion gap: 5 (ref 5–15)
BUN: 19 mg/dL (ref 6–20)
CHLORIDE: 103 mmol/L (ref 101–111)
CO2: 28 mmol/L (ref 22–32)
CREATININE: 0.93 mg/dL (ref 0.61–1.24)
Calcium: 8.9 mg/dL (ref 8.9–10.3)
GFR calc non Af Amer: >60 mL/min (ref >60)
Glucose, Bld: 87 mg/dL (ref 65–99)
POTASSIUM: 3.7 mmol/L (ref 3.5–5.1)
SODIUM: 136 mmol/L (ref 135–145)

## 2017-03-01 LAB — GLUCOSE, CAPILLARY: GLUCOSE-CAPILLARY: 89 mg/dL (ref 65–99)

## 2017-03-01 LAB — CBC
HEMATOCRIT: 32.8 % — AB (ref 39.0–52.0)
HEMATOCRIT: 35.8 % — AB (ref 39.0–52.0)
HEMOGLOBIN: 10.1 g/dL — AB (ref 13.0–17.0)
Hemoglobin: 11.3 g/dL — ABNORMAL LOW (ref 13.0–17.0)
MCH: 25.7 pg — ABNORMAL LOW (ref 26.0–34.0)
MCH: 26.2 pg (ref 26.0–34.0)
MCHC: 30.8 g/dL (ref 30.0–36.0)
MCHC: 31.6 g/dL (ref 30.0–36.0)
MCV: 83.5 fL (ref 78.0–100.0)
MCV: 82.9 fL (ref 78.0–100.0)
Platelets: 356 10*3/uL (ref 150–400)
Platelets: 303 10*3/uL (ref 150–400)
RBC: 3.93 MIL/uL — AB (ref 4.22–5.81)
RBC: 4.32 MIL/uL (ref 4.22–5.81)
RDW: 16.9 % — ABNORMAL HIGH (ref 11.5–15.5)
RDW: 16.8 % — AB (ref 11.5–15.5)
WBC: 9.2 10*3/uL (ref 4.0–10.5)
WBC: 8.5 10*3/uL (ref 4.0–10.5)

## 2017-03-01 LAB — PROTIME-INR
INR: 0.9
PROTHROMBIN TIME: 12.1 s (ref 11.4–15.2)

## 2017-03-01 LAB — CREATININE, SERUM
Creatinine, Ser: 0.75 mg/dL (ref 0.61–1.24)
GFR calc non Af Amer: >60 mL/min (ref >60)

## 2017-03-01 SURGERY — LEFT HEART CATH AND CORS/GRAFTS ANGIOGRAPHY
Anesthesia: LOCAL

## 2017-03-01 MED ORDER — SPIRONOLACTONE 25 MG PO TABS
25.0000 mg | ORAL_TABLET | Freq: Every day | ORAL | Status: DC
  Administered 2017-03-01 – 2017-03-02 (×2): 25 mg via ORAL
  Filled 2017-03-01 (×2): qty 1

## 2017-03-01 MED ORDER — FENTANYL CITRATE (PF) 100 MCG/2ML IJ SOLN
INTRAMUSCULAR | Status: DC | PRN
  Administered 2017-03-01: 50 ug via INTRAVENOUS

## 2017-03-01 MED ORDER — SODIUM CHLORIDE 0.9% FLUSH
3.0000 mL | Freq: Two times a day (BID) | INTRAVENOUS | Status: DC
  Administered 2017-03-01: 3 mL via INTRAVENOUS

## 2017-03-01 MED ORDER — HEPARIN (PORCINE) IN NACL 2-0.9 UNIT/ML-% IJ SOLN
INTRAMUSCULAR | Status: AC
  Filled 2017-03-01: qty 1000

## 2017-03-01 MED ORDER — PREDNISONE 5 MG PO TABS
5.0000 mg | ORAL_TABLET | Freq: Every day | ORAL | Status: DC
  Administered 2017-03-02: 5 mg via ORAL
  Filled 2017-03-01: qty 1

## 2017-03-01 MED ORDER — MIDAZOLAM HCL 2 MG/2ML IJ SOLN
INTRAMUSCULAR | Status: DC | PRN
Start: 2017-03-01 — End: 2017-03-01
  Administered 2017-03-01: 1 mg via INTRAVENOUS

## 2017-03-01 MED ORDER — SODIUM CHLORIDE 0.9% FLUSH: 3.0000 mL | INTRAVENOUS | Status: DC | PRN

## 2017-03-01 MED ORDER — IOPAMIDOL (ISOVUE-370) INJECTION 76%
INTRAVENOUS | Status: DC | PRN
  Administered 2017-03-01: 115 mL via INTRA_ARTERIAL

## 2017-03-01 MED ORDER — HEPARIN SODIUM (PORCINE) 1000 UNIT/ML IJ SOLN
INTRAMUSCULAR | Status: AC
  Filled 2017-03-01: qty 1

## 2017-03-01 MED ORDER — HEPARIN SODIUM (PORCINE) 1000 UNIT/ML IJ SOLN
INTRAMUSCULAR | Status: DC | PRN
  Administered 2017-03-01: 4000 [IU] via INTRAVENOUS

## 2017-03-01 MED ORDER — HEPARIN (PORCINE) IN NACL 2-0.9 UNIT/ML-% IJ SOLN
INTRAMUSCULAR | Status: DC | PRN
  Administered 2017-03-01: 1000 mL

## 2017-03-01 MED ORDER — OXYCODONE-ACETAMINOPHEN 5-325 MG PO TABS: 1.0000 | ORAL_TABLET | ORAL | Status: DC | PRN

## 2017-03-01 MED ORDER — LIDOCAINE HCL (PF) 1 % IJ SOLN
INTRAMUSCULAR | Status: DC | PRN
  Administered 2017-03-01: 2 mL

## 2017-03-01 MED ORDER — SODIUM CHLORIDE 0.9 % IV SOLN: 250.0000 mL | INTRAVENOUS | Status: DC | PRN

## 2017-03-01 MED ORDER — FENTANYL CITRATE (PF) 100 MCG/2ML IJ SOLN
INTRAMUSCULAR | Status: AC
  Filled 2017-03-01: qty 2

## 2017-03-01 MED ORDER — SODIUM CHLORIDE 0.9 % IV SOLN: INTRAVENOUS | Status: AC

## 2017-03-01 MED ORDER — ACETAMINOPHEN 325 MG PO TABS
650.0000 mg | ORAL_TABLET | ORAL | Status: DC | PRN
  Administered 2017-03-01 (×2): 650 mg via ORAL
  Filled 2017-03-01 (×2): qty 2

## 2017-03-01 MED ORDER — ONDANSETRON HCL 4 MG/2ML IJ SOLN: 4.0000 mg | Freq: Four times a day (QID) | INTRAMUSCULAR | Status: DC | PRN

## 2017-03-01 MED ORDER — LIDOCAINE HCL (PF) 1 % IJ SOLN
INTRAMUSCULAR | Status: AC
  Filled 2017-03-01: qty 30

## 2017-03-01 MED ORDER — IOPAMIDOL (ISOVUE-370) INJECTION 76%
INTRAVENOUS | Status: AC
  Filled 2017-03-01: qty 125

## 2017-03-01 MED ORDER — ENOXAPARIN SODIUM 40 MG/0.4ML ~~LOC~~ SOLN
40.0000 mg | SUBCUTANEOUS | Status: DC
  Administered 2017-03-02: 40 mg via SUBCUTANEOUS
  Filled 2017-03-01: qty 0.4

## 2017-03-01 MED ORDER — VERAPAMIL HCL 2.5 MG/ML IV SOLN
INTRAVENOUS | Status: AC
  Filled 2017-03-01: qty 2

## 2017-03-01 MED ORDER — LOSARTAN POTASSIUM 25 MG PO TABS
25.0000 mg | ORAL_TABLET | Freq: Every day | ORAL | Status: DC
  Administered 2017-03-02: 25 mg via ORAL
  Filled 2017-03-01: qty 1

## 2017-03-01 MED ORDER — ASPIRIN 81 MG PO CHEW: 81.0000 mg | CHEWABLE_TABLET | Freq: Every day | ORAL | Status: DC

## 2017-03-01 MED ORDER — METOPROLOL SUCCINATE ER 50 MG PO TB24
50.0000 mg | ORAL_TABLET | Freq: Every day | ORAL | Status: DC
  Administered 2017-03-02: 50 mg via ORAL
  Filled 2017-03-01: qty 1

## 2017-03-01 MED ORDER — HEPARIN (PORCINE) IN NACL 2-0.9 UNIT/ML-% IJ SOLN
INTRAMUSCULAR | Status: DC | PRN
  Administered 2017-03-01: 10 mL via INTRA_ARTERIAL

## 2017-03-01 MED ORDER — MIDAZOLAM HCL 2 MG/2ML IJ SOLN
INTRAMUSCULAR | Status: AC
  Filled 2017-03-01: qty 2

## 2017-03-01 SURGICAL SUPPLY — 11 items
CATH INFINITI 5FR MULTPACK ANG (CATHETERS) ×2 IMPLANT
COVER PRB 48X5XTLSCP FOLD TPE (BAG) ×1 IMPLANT
COVER PROBE 5X48 (BAG) ×2
DEVICE RAD COMP TR BAND LRG (VASCULAR PRODUCTS) ×2 IMPLANT
GLIDESHEATH SLEND A-KIT 6F 22G (SHEATH) ×2 IMPLANT
GUIDEWIRE INQWIRE 1.5J.035X260 (WIRE) ×1 IMPLANT
INQWIRE 1.5J .035X260CM (WIRE) ×2
KIT HEART LEFT (KITS) ×2 IMPLANT
PACK CARDIAC CATHETERIZATION (CUSTOM PROCEDURE TRAY) ×2 IMPLANT
TRANSDUCER W/STOPCOCK (MISCELLANEOUS) ×2 IMPLANT
TUBING CIL FLEX 10 FLL-RA (TUBING) ×2 IMPLANT

## 2017-03-01 NOTE — Progress Notes (Signed)
Progress Note  Patient Name: [name] Date of Encounter: 03/01/2017  Primary Cardiologist: new- Dr. Delton See  Subjective   Feeling well.  Denies chest pain or shortness of breath.  Eager to start lifestyle changes, including smoking cessation.   Inpatient Medications    Scheduled Meds: . aspirin  81 mg Oral Daily  . [START ON 03/02/2017] aspirin  81 mg Oral Daily  . atorvastatin  40 mg Oral q1800  . [START ON 03/02/2017] enoxaparin (LOVENOX) injection  40 mg Subcutaneous Q24H  . feeding supplement (ENSURE ENLIVE)  237 mL Oral BID BM  . ipratropium-albuterol  3 mL Nebulization TID  . lisinopril  2.5 mg Oral Daily  . metoprolol tartrate  25 mg Oral BID  . [START ON 03/02/2017] predniSONE  5 mg Oral Q breakfast  . sodium chloride flush  3 mL Intravenous Q12H  . sodium chloride flush  3 mL Intravenous Q12H  . varenicline  0.5 mg Oral BID   Followed by  . [START ON 03/04/2017] varenicline  1 mg Oral BID   Continuous Infusions: . sodium chloride 50 mL/hr at 03/01/17 1107  . sodium chloride     PRN Meds: sodium chloride, acetaminophen, albuterol, bisacodyl, HYDROcodone-acetaminophen, [DISCONTINUED] ondansetron **OR** ondansetron (ZOFRAN) IV, oxyCODONE-acetaminophen, polyethylene glycol, sodium chloride flush   Vital Signs    Vitals:   03/01/17 1019 03/01/17 1024 03/01/17 1029 03/01/17 1102  BP: 103/70 107/72 113/77 113/64  Pulse: 75 74 76 71  Resp: 20 (!) 21 (!) 25 18  Temp:    97.7 F (36.5 C)  TempSrc:    Oral  SpO2: 97% 96% 100% 96%  Weight:      Height:        Intake/Output Summary (Last 24 hours) at 03/01/17 1110 Last data filed at 03/01/17 1102  Gross per 24 hour  Intake              900 ml  Output             2450 ml  Net            -1550 ml   Filed Weights   02/27/17 0518 02/28/17 0448 03/01/17 0413  Weight: 76.8 kg (169 lb 4.8 oz) 76.9 kg (169 lb 8 oz) 77.3 kg (170 lb 8 oz)    Telemetry    Sinus rhythm. PVCs. - Personally Reviewed  ECG    Sinus  rhythm.  Rate 78 bpm.  Lateral TWI consistent with ischemia.  - Personally Reviewed  Physical Exam   GEN: No acute distress.   Neck: No JVD Cardiac: RRR, no murmurs, rubs, or gallops.  Respiratory: Dry crackles throughout. GI: Soft, nontender, non-distended  MS: No edema; No deformity.  L TR band in place.  Neuro:  Nonfocal  Psych: Normal affect   Labs    Chemistry Recent Labs Lab 02/24/17 1421  02/26/17 0400 02/27/17 0502 03/01/17 0319  NA 139  < > 137 138 136  K 4.8  < > 4.4 4.2 3.7  CL 100  < > 102 101 103  CO2 24  < > 28 29 28   GLUCOSE 88  < > 156* 106* 87  BUN 9  < > 18 27* 19  CREATININE 0.83  < > 0.84 0.91 0.93  CALCIUM 9.0  < > 9.2 9.1 8.9  PROT 7.8  --   --   --   --   ALBUMIN 3.7  --   --   --   --  AST 10  --   --   --   --   ALT 8  --   --   --   --   ALKPHOS 118*  --   --   --   --   BILITOT 0.8  --   --   --   --   GFRNONAA 95  < > >60 >60 >60  GFRAA 110  < > >60 >60 >60  ANIONGAP  --   < > 7 8 5   < > = values in this interval not displayed.   Hematology Recent Labs Lab 02/24/17 1920 02/25/17 0220 03/01/17 0319  WBC 8.6 8.0 9.2  RBC 3.91* 3.53*  3.53* 3.93*  HGB 10.3* 9.1* 10.1*  HCT 31.9* 28.5* 32.8*  MCV 81.6 80.7 83.5  MCH 26.3 25.8* 25.7*  MCHC 32.3 31.9 30.8  RDW 16.9* 16.6* 16.9*  PLT 335 283 356    Cardiac Enzymes Recent Labs Lab 02/25/17 0220 02/25/17 0733  TROPONINI 0.03* <0.03    Recent Labs Lab 02/24/17 1940 02/24/17 2256  TROPIPOC 0.00 0.02     BNP Recent Labs Lab 02/24/17 1920  BNP 576.5*     DDimer No results for input(s): DDIMER in the last 168 hours.   Radiology    No results found.  Cardiac Studies   LHC 03/01/17:  Severe native vessel coronary disease with total occlusion of the left main, ramus intermedius, circumflex, and LAD.  Bypass graft failure with occlusion of the SVG to the distal RCA, and occlusion of the sequential SVG to the circumflex/marginal system.  Patent LIMA to the LAD. The  very distal 70% stenosis in the apical LAD beyond the graft insertion site.  Patent native right coronary with segmental mid vessel disease in the 50-70% range. The distal RCA gives collaterals via septal perforators and to the circumflex. Collaterals to the circumflex territory faintly opacify the native circumflex.  Markedly dilated and severely hypocontractile left ventricle with an estimated ejection fraction less than 30%. The LV the EDP is at the upper limit of normal.  Echo 02/26/17: Study Conclusions  - Left ventricle: The cavity size was mildly dilated. Wall   thickness was increased in a pattern of mild LVH. There was focal   basal hypertrophy. Systolic function was moderately reduced. The   estimated ejection fraction was in the range of 35% to 40%.   Diffuse hypokinesis. Akinesis of the basal-midinferior   myocardium. Doppler parameters are consistent with abnormal left   ventricular relaxation (grade 1 diastolic dysfunction). - Mitral valve: There was mild regurgitation. - Left atrium: The atrium was mildly to moderately dilated. - Right atrium: The atrium was mildly dilated. - Pulmonary arteries: Systolic pressure was moderately increased.   PA peak pressure: 42 mm Hg (S).  Lexiscan Myoview 02/27/17:  There was no ST segment deviation noted during stress.  T wave inversion was noted during stress in the I, II, aVF, V4, V5 and V6 leads. T wave inversion persisted.  Defect 1: There is a large defect present in the basal anterior, basal inferior, basal inferolateral, basal anterolateral, mid anterior, mid inferior, mid inferolateral, mid anterolateral and apical lateral location.  Findings consistent with prior myocardial infarction with peri-infarct ischemia.  This is a high risk study.  The left ventricular ejection fraction is severely decreased (<30%).     Patient Profile     61 y.o. male with AAA, CAD s/p CABG, prior stroke, hypertension, hyperlipidemia and  tobacco abuse here with  Assessment & Plan    # Acute systolic and diastolic heart failure: He is net negative 3.3L since admission.  Denies lower extremity edema, shortness of breath, orthopnea or PND.  Will switch metoprolol to succinate given his heart failure.  Will switch lisinopril to valsartan, with plan to start Entresto as an outpatient if BP will tolerate.  Start spironolactone 25mg  daily  # CAD s/p CABG: [name] underwent LHC today and was found to have severe native vessel disease with occlusion of the LM, RI, and LAD.  SVG to RCA and sequential graft to the LCx/OM system are also occluded.  LIMA to LAD is patent, but there is a 70% apical LAD lesion distal to the graft insertion. No interventions occurred and medical therapy was recommended.  Continue aspirin.   # Tobacco abuse:  Stressed the importance of smoking cessation. Started Chantix this admission  # Hypertension:  BP controlled.  Metoprolol and stat valsartan as above.   # Hyperlipidemia: Atorvastatin was increased to 40 mg this admission due to LDL 103.     Signed, Christell Constant, MD  03/01/2017, 11:10 AM

## 2017-03-01 NOTE — Care Management Note (Signed)
Case Management Note  Patient Details  Name: [name] MRN: [id number] Date of Birth: 1956/02/25  Subjective/Objective:      Admitted with Interstitial Lung Disease             Action/Plan: Patient lives with his sister; PCP was Dr Lily Kocher but patient stated that he would like to change MD and is agreeable for follow up care at the [facility] and [facility]; CM called the clinic for apt but the [facility] stated that they would did not have access to another Md 's schedule until tomorrow; No medical insurance, Medicaid application is pending; his sister has been paying for his medication at [facility]; Coupon card for [facility] given to the patient with instructions on usage; CM also encouraged patient to apply for the medication assistance program for ongoing assistance; Noted HHPT recommendations; [facility] called for referral, Lupita Leash with [facility] stated that she could not accept the patient due to not meeting Medicare guidelines for physical therapy.   Expected Discharge Date:    Possibly 03/02/2017              Expected Discharge Plan:  Home w Home Health Services  In-House Referral:   Financial Counselor  Discharge planning Services  CM Consult    Choice offered to:  Patient  Status of Service:  In process, will continue to follow  Cherrie Distance RN ,MHA,BSN [phone] 03/01/2017, 3:32 PM

## 2017-03-01 NOTE — Interval H&P Note (Signed)
Cath Lab Visit (complete for each Cath Lab visit)  Clinical Evaluation Leading to the Procedure:   ACS: No.  Non-ACS:    Anginal Classification: CCS II  Anti-ischemic medical therapy: No Therapy  Non-Invasive Test Results: No non-invasive testing performed  Prior CABG: Previous CABG      History and Physical Interval Note:  03/01/2017 9:35 AM  [name]  has presented today for surgery, with the diagnosis of unstable angina  The various methods of treatment have been discussed with the patient and family. After consideration of risks, benefits and other options for treatment, the patient has consented to  Procedure(s): Left Heart Cath and Coronary Angiography (N/A) as a surgical intervention .  The patient's history has been reviewed, patient examined, no change in status, stable for surgery.  I have reviewed the patient's chart and labs.  Questions were answered to the patient's satisfaction.     Lyn Records III

## 2017-03-01 NOTE — H&P (View-Only) (Signed)
Patient Name: [name] Date of Encounter: 02/28/2017  Principal Problem:   Interstitial lung disease (HCC) Active Problems:   CAD (coronary artery disease)   Normocytic anemia   AAA (abdominal aortic aneurysm) (HCC)   Abnormal EKG   Acute respiratory failure with hypoxia (HCC)   History of stroke   Essential hypertension   Current smoker   Exertional shortness of breath   Nodule of right lung   Length of Stay: 4  SUBJECTIVE  He feels better and denies chest pain or SOB.   CURRENT MEDS . aspirin  81 mg Oral Daily  . atorvastatin  40 mg Oral q1800  . enoxaparin (LOVENOX) injection  40 mg Subcutaneous Q24H  . feeding supplement (ENSURE ENLIVE)  237 mL Oral BID BM  . ipratropium-albuterol  3 mL Nebulization TID  . lisinopril  2.5 mg Oral Daily  . metoprolol tartrate  25 mg Oral BID  . predniSONE  20 mg Oral Q breakfast  . sodium chloride flush  3 mL Intravenous Q12H  . varenicline  0.5 mg Oral BID   Followed by  . [START ON 03/04/2017] varenicline  1 mg Oral BID    OBJECTIVE  Vitals:   02/27/17 2031 02/27/17 2137 02/28/17 0448 02/28/17 0902  BP: 102/60  109/68   Pulse: 80  83   Resp: 20  20   Temp: 97.8 F (36.6 C)  97.9 F (36.6 C)   TempSrc: Oral  Oral   SpO2: 94% 96% 95% 93%  Weight:   169 lb 8 oz (76.9 kg)   Height:        Intake/Output Summary (Last 24 hours) at 02/28/17 0921 Last data filed at 02/28/17 0909  Gross per 24 hour  Intake             1200 ml  Output             2350 ml  Net            -1150 ml   Filed Weights   02/26/17 0615 02/27/17 0518 02/28/17 0448  Weight: 168 lb 3.2 oz (76.3 kg) 169 lb 4.8 oz (76.8 kg) 169 lb 8 oz (76.9 kg)    PHYSICAL EXAM  General: Pleasant, NAD. Neuro: Alert and oriented X 3. Moves all extremities spontaneously. Psych: Normal affect. HEENT:  Normal  Neck: Supple without bruits or JVD. Lungs:  Resp regular and unlabored, wheezing B/L. Heart: RRR no s3, s4, or murmurs. Abdomen: Soft, non-tender,  non-distended, BS + x 4.  Extremities: No clubbing, cyanosis or edema. DP/PT/Radials 2+ and equal bilaterally.  Accessory Clinical Findings  CBC No results for input(s): WBC, NEUTROABS, HGB, HCT, MCV, PLT in the last 72 hours. Basic Metabolic Panel  Recent Labs  02/26/17 0400 02/27/17 0502  NA 137 138  K 4.4 4.2  CL 102 101  CO2 28 29  GLUCOSE 156* 106*  BUN 18 27*  CREATININE 0.84 0.91  CALCIUM 9.2 9.1  MG  --  2.3   Liver Function Tests No results for input(s): AST, ALT, ALKPHOS, BILITOT, PROT, ALBUMIN in the last 72 hours. No results for input(s): LIPASE, AMYLASE in the last 72 hours. Cardiac Enzymes No results for input(s): CKTOTAL, CKMB, CKMBINDEX, TROPONINI in the last 72 hours. BNP Invalid input(s): POCBNP D-Dimer No results for input(s): DDIMER in the last 72 hours. Hemoglobin A1C No results for input(s): HGBA1C in the last 72 hours. Fasting Lipid Panel  Recent Labs  02/27/17 0502  CHOL  164  HDL 32*  LDLCALC 103*  TRIG 144  CHOLHDL 5.1   Thyroid Function Tests No results for input(s): TSH, T4TOTAL, T3FREE, THYROIDAB in the last 72 hours.  Invalid input(s): FREET3  Radiology/Studies  Dg Chest 2 View  Result Date: 02/27/2017 CLINICAL DATA:  Shortness of Breath EXAM: CHEST  2 VIEW COMPARISON:  Chest radiograph February 24, 2017 and chest CT February 25, 2017 ; earlier chest radiograph May 27, 2004 FINDINGS: The interstitium is diffusely prominent. There is no appreciable airspace consolidation. Heart is borderline enlarged with pulmonary vascularity within normal limits. No adenopathy. Patient is status post coronary artery bypass grafting. There are fractures of the 3 superior-most sternal wires. No adenopathy. No pneumothorax. There are several old, healed rib fractures on the left superiorly. IMPRESSION: Diffuse interstitial prominence. Suspect a degree of chronic congestive heart failure, although there may well be underlying interstitial fibrosis. Both  entities may be present concurrently. Stable cardiac prominence. Several sternal wires fractured. Patient is status post coronary artery bypass grafting. No adenopathy. Electronically Signed   By: Bretta Bang III M.D.   On: 02/27/2017 11:47   Dg Chest 2 View  Result Date: 02/24/2017 CLINICAL DATA:  Abnormal EKG. Follow-up chest radiograph requested. Initial encounter. EXAM: CHEST  2 VIEW COMPARISON:  Chest radiograph performed 02/19/2017 FINDINGS: The lungs are well-aerated. Peripheral scarring is noted bilaterally, with diffusely increased interstitial markings, concerning for worsening interstitial lung disease. There is no evidence of pleural effusion or pneumothorax. The heart is borderline enlarged. The patient is status post median sternotomy. There are fractures of the superior most sternal wires. No acute osseous abnormalities are seen. IMPRESSION: 1. Peripheral scarring, with diffusely increased interstitial markings, concerning for worsening interstitial lung disease. Underlying pneumonia cannot be excluded. High-resolution CT is recommended to assess for interstitial lung disease, when and as deemed clinically appropriate. 2. Borderline cardiomegaly. Electronically Signed   By: Roanna Raider M.D.   On: 02/24/2017 19:40   Dg Chest 2 View  Result Date: 02/19/2017 CLINICAL DATA:  Cough.  Weakness. EXAM: CHEST  2 VIEW COMPARISON:  Chest x-ray 02/09/2017, 05/27/2004 CT 02/09/2017. FINDINGS: Prior CABG. Fractured sternal sutures are noted. Stable cardiomegaly. Diffuse interstitial prominence is again noted. Interstitial prominence has improved slightly suggesting improving interstitial edema and/or pneumonitis. A component chronic interstitial lung disease most likely present. Reference is made to prior CT report of 02/09/2017. No pleural effusion pneumothorax. No acute bony abnormality. IMPRESSION: 1. Prior CABG. Fractured sternal sutures again noted. Stable cardiomegaly. Diffuse interstitial  prominence is again noted. Interstitial prominence has improved slightly suggesting improving interstitial edema and/or pneumonitis. 2. A component of chronic interstitial lung disease most likely present. Reference is made to prior CT report of 02/09/2017 . Electronically Signed   By: Maisie Fus  Register   On: 02/19/2017 13:45   Dg Chest 2 View  Result Date: 02/09/2017 CLINICAL DATA:  61 year old male with cough and shortness of breath for the past month. Hypertension. Post CABG. Smoker. Initial encounter. EXAM: CHEST  2 VIEW COMPARISON:  05/27/2004. FINDINGS: Significant change since prior examination. Diffuse increased lung markings, possibly chronic although not able to exclude superimposed mild pulmonary vascular congestion. Given the significant change, lack of more recent films and patient's history smoking, chest CT with high-resolution imaging may be considered for further delineation. Fracture of the left first through fifth ribs new from prior exam although appearing chronic. No pneumothorax. Post CABG with fractured sternal wires.  Cardiomegaly. IMPRESSION: Significant change since prior examination. Diffuse increased lung markings, possibly  chronic although not able to exclude superimposed mild pulmonary vascular congestion. Given the significant change, lack of more recent films and patient's history of smoking, chest CT with high-resolution imaging may be considered for further delineation. Fracture of the left first through fifth ribs new from prior exam although appearing chronic. Post CABG Cardiomegaly. Electronically Signed   By: Lacy Duverney M.D.   On: 02/09/2017 11:35   Ct Chest Wo Contrast  Result Date: 02/09/2017 CLINICAL DATA:  Generalized weakness. Productive cough. Current smoker. Abnormal chest radiograph. EXAM: CT CHEST WITHOUT CONTRAST TECHNIQUE: Multidetector CT imaging of the chest was performed following the standard protocol without IV contrast. COMPARISON:  Chest radiograph  from earlier today. FINDINGS: Cardiovascular: Mild cardiomegaly. No significant pericardial fluid/thickening. Left main, left anterior descending, left circumflex and right coronary atherosclerosis status post CABG. Atherosclerotic nonaneurysmal thoracic aorta. Top-normal caliber pulmonary arteries (main pulmonary artery diameter 3.2 cm). Mediastinum/Nodes: No discrete thyroid nodules. Unremarkable esophagus. No pathologically enlarged axillary lymph nodes. Right paratracheal adenopathy measuring up to 1.2 cm (series 201/ image 47). Enlarged 1.4 cm subcarinal node (series 201/ image 60). AP window adenopathy measuring up to 1.0 cm (series 201/ image 49). Additional pathologically enlarged mediastinal or gross hilar nodes on this noncontrast scan. Lungs/Pleura: No pneumothorax. No pleural effusion. Mild centrilobular and paraseptal emphysema with diffuse bronchial wall thickening. Anterior right upper lobe 7 mm solid pulmonary nodule (series 205/ image 44). No acute consolidative airspace disease, lung masses or additional significant pulmonary nodules. There is patchy ground-glass attenuation and reticulation involving the subpleural greater than peribronchovascular lungs, without a clear basilar gradient. There is a suggestion of associated mild traction bronchiolectasis and mild architectural distortion, with no frank honeycombing. There is mild interlobular septal thickening throughout both lungs. Upper abdomen: Unremarkable. Musculoskeletal: No aggressive appearing focal osseous lesions. Stable discontinuities in the 3 most superior sternotomy wires. Additional sternotomy wires appear intact . Subacute healing left posterior second through seventh rib fractures. Moderate thoracic spondylosis. IMPRESSION: 1. Cardiomegaly with complex spectrum of pulmonary parenchymal findings suggestive of mild pulmonary edema superimposed on a fibrotic interstitial lung disease. Given the absence of frank honeycombing and the  absence of a clear basilar gradient, the suspected underlying interstitial lung disease is most compatible with nonspecific interstitial pneumonia (NSIP), however early usual interstitial pneumonia (UIP) cannot be excluded and a follow-up high-resolution chest CT is warranted in 6-12 months. 2. Solid 7 mm right upper lobe pulmonary nodule. Non-contrast chest CT at 6-12 months is recommended. If the nodule is stable at time of repeat CT, then future CT at 18-24 months (from today's scan) is considered optional for low-risk patients, but is recommended for high-risk patients. This recommendation follows the consensus statement: Guidelines for Management of Incidental Pulmonary Nodules Detected on CT Images: From the Fleischner Society 2017; Radiology 2017; 284:228-243. 3. Nonspecific mild mediastinal lymphadenopathy, more likely reactive. 4. Aortic atherosclerosis. Left main and 3 vessel coronary atherosclerosis status post CABG. 5. Mild emphysema and diffuse bronchial wall thickening, suggesting COPD. 6. Subacute healing posterior left second through seventh rib fractures. No pneumothorax. No pleural effusions. Electronically Signed   By: Delbert Phenix M.D.   On: 02/09/2017 16:36   Ct Angio Chest Pe W Or Wo Contrast  Result Date: 02/25/2017 CLINICAL DATA:  61 year old male with hypoxia and tachycardia. EXAM: CT ANGIOGRAPHY CHEST WITH CONTRAST TECHNIQUE: Multidetector CT imaging of the chest was performed using the standard protocol during bolus administration of intravenous contrast. Multiplanar CT image reconstructions and MIPs were obtained to evaluate the vascular  anatomy. CONTRAST:  100 cc Isovue 370 COMPARISON:  Chest radiograph dated 02/24/2017 and CT dated 02/09/2017 FINDINGS: Cardiovascular: There mild to moderate cardiomegaly with enlargement of the left ventricle. There is focal area of aneurysmal dilatation with thinning of the myometrium involving the lateral ventricular wall. Multi vessel coronary  vascular calcification and CABG surgery noted. There is no pericardial effusion. The thoracic aorta appears unremarkable. There is mild atherosclerotic calcification of the aorta. Evaluation of the pulmonary arteries is limited due to suboptimal opacification of the peripheral branches. No definite central pulmonary artery embolus identified. V/Q scan may provide better evaluation if there is high clinical concern for acute PE. Mediastinum/Nodes: There is no hilar adenopathy. Top-normal right hilar lymph node. Subcarinal lymph node measures approximately 15 mm in short axis. The esophagus is grossly unremarkable. No mediastinal fluid collection. Lungs/Pleura: There is paraseptal emphysema. There is diffuse interstitial coarsening with mild diffuse bronchial wall thickening. There is ground-glass attenuation of the lung with areas of reticulation involving the subpleural and peribronchovascular lungs. There is a 7 mm right upper lobe subpleural nodule as seen on the prior CT. No acute consolidative changes noted. There is no pleural effusion or pneumothorax. The central airways are patent. Upper Abdomen: No acute abnormality. Musculoskeletal: Mild degenerate changes of the spine. Median sternotomy wires noted. Left posterior rib fractures as seen on the prior CT, incompletely healed at this time. No acute osseous pathology. Review of the MIP images confirms the above findings. IMPRESSION: 1. No acute intrathoracic pathology. No CT evidence of central pulmonary artery embolus. Evaluation of the peripheral pulmonary arteries is limited due to suboptimal opacification. V/Q scan may provide better evaluation if there is high clinical concern for acute PE. 2. Paraseptal emphysema with findings suggestive of underlying interstitial lung disease. No new consolidative changes. 3. A 7 mm right upper lobe pulmonary nodule. Non-contrast chest CT at 6-12 months is recommended. If the nodule is stable at time of repeat CT, then  future CT at 18-24 months (from today's scan) is considered optional for low-risk patients, but is recommended for high-risk patients. This recommendation follows the consensus statement: Guidelines for Management of Incidental Pulmonary Nodules Detected on CT Images: From the Fleischner Society 2017; Radiology 2017; 284:228-243. 4. Mildly enlarged subcarinal lymph node as seen on the prior study and indeterminate etiology, likely reactive. 5. Atherosclerotic calcification of the aorta with 3 vessel coronary vascular disease and CABG changes. 6. 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 7. Multiple left posterior rib fractures with incomplete healing at this time. No new fractures. Electronically Signed   By: Elgie Collard M.D.   On: 02/25/2017 04:20   TELE: SR, personally reviewed    ASSESSMENT AND PLAN  1. Acute on chronic systolic and diastolic heart failure             - still has B/L crackles in bilateral base, we will start lasix 20 mg iv BID             - continue lisinopril, metoprolol.   2. Tobacco abuse             - CTA negative, CXR suggestive of ILD, critical care consulted- per primary team and critical care  - he is wheezing and getting breathing treatment.   - started on Chantix  3. CAD s/p CABG x 4 in 1993: continue statin, increase lipitor to 40, LDL 103 - given h/o poor compliance and recent echo showing EF 35%, myoview  abnormal, we will plan for a cath on Monday - he will require case manager consult for home meds  4. AAA  5. HTN  6. RUL pulm nodule: seen on CTA of chest, recommended future CT at 18-24 month.    Signed, [name] MD, [facility] 02/28/2017

## 2017-03-01 NOTE — Progress Notes (Signed)
PROGRESS NOTE        PATIENT DETAILS Name: [name] Age: 61 y.o. Sex: male Date of Birth: 10/02/1956 Admit Date: 02/24/2017 Admitting Physician Vianne Bulls, MD EUM:PNTIR Roderic Ovens, PA-C  Brief Narrative:  Patient is a 61 y.o. male with remote history of CAD status post CABG, CVA with minimal left-sided deficits, long-standing tobacco use who presented to the ED for several months history of exertional dyspnea that has been progressively worsening, further workup suggested acute on chronic systolic CHF with positive Lexiscan. Cardiology and pulmonary following.   Subjective:  Patient in bed, appears comfortable, denies any headache, no fever, no chest pain or pressure, no shortness of breath , no abdominal pain. No focal weakness.  Assessment/Plan:  Acute hypoxemic respiratory failure due to acute on chronic systolic CHF EF 44%.: Seen by cardiology and pulmonary, good response to IV diuretics suggesting this most likely is CHF. ILD workup in progress-PFTs/autoimmune workup ongoing with so far high ESR, high CCP and rheumatoid factor, pending histone antibodies, other serological tests negative. Per pulmonary taper off oral steroids which are being tapered, thereafter outpatient follow-up with [name].   Continue Lasix, ACE and beta blocker along with aspirin and statin for secondary prevention of CAD and CHF, his Lexiscan was positive for her was able ischemia and he is due for left heart catheterization on 03/01/2017.   He is so far 2.9 lit negative although intake output not currently charted, weight 170 pounds.   Interstitial lung disease: ANA negative but high ESR, high CCP and rheumatoid factor was high, previous M.D. discussed the case with [name] who recommends prednisone taper in the next 1 week. [name] will follow patient as outpatient-appointment scheduled for 04/01/17.  Acute on chronic systolic heart failure: as #1  above.[name]  NSVT: Seen on telemetry-ocular and 4/26-continue beta blocker-we will recheck electrolytes including magnesium tomorrow morning.  History of CAD status post CABG in the 1990s: Troponins negative-EKG shows T-wave inversions in the anterolateral leads-no prior EKGs to compare with.See above  Normocytic anemia: Iron panel consistent with anemia of chronic disease. Continue to monitor. No indication for transfusion.  History of recent CVA: Slight/mild left-sided deficits-continue aspirin.  Tobacco abuse: Counseled, on Chantix.  14m Right lung nodule: Will require a repeat CT chest in 6-12 months time he is a smoker. Outpt Pulm follow up.    DVT Prophylaxis: Prophylactic Lovenox  Code Status: Full code   Family Communication: Sister at bedside  Disposition Plan: Remain inpatient-requires a few more days of hospitalization prior to discharge.   Antimicrobial agents: Anti-infectives    None     Procedures:  TTE - Left ventricle: The cavity size was mildly dilated. Wall thickness was increased in a pattern of mild LVH. There was focal basal hypertrophy. Systolic function was moderately reduced. The estimated ejection fraction was in the range of 35% to 40%. Diffuse hypokinesis. Akinesis of the basal-midinferior myocardium. Doppler parameters are consistent with abnormal left ventricular relaxation (grade 1 diastolic dysfunction). - Mitral valve: There was mild regurgitation. - Left atrium: The atrium was mildly to moderately dilated. - Right atrium: The atrium was mildly dilated. - Pulmonary arteries: Systolic pressure was moderately increased.   PA peak pressure: 42 mm Hg (S).  Lexiscan positive for reversible ischemia per cardiology.  CT angiogram chest showing 7 mm right-sided lung nodule.  Left heart catheterization.   CONSULTS:  pulmonary/intensive care  Time spent: 25 minutes-Greater than 50% of this time was spent in counseling, explanation of diagnosis,  planning of further management, and coordination of care.  MEDICATIONS: Scheduled Meds: . aspirin  81 mg Oral Daily  . atorvastatin  40 mg Oral q1800  . enoxaparin (LOVENOX) injection  40 mg Subcutaneous Q24H  . feeding supplement (ENSURE ENLIVE)  237 mL Oral BID BM  . ipratropium-albuterol  3 mL Nebulization TID  . lisinopril  2.5 mg Oral Daily  . metoprolol tartrate  25 mg Oral BID  . predniSONE  10 mg Oral Q breakfast  . sodium chloride flush  3 mL Intravenous Q12H  . sodium chloride flush  3 mL Intravenous Q12H  . varenicline  0.5 mg Oral BID   Followed by  . [START ON 03/04/2017] varenicline  1 mg Oral BID   Continuous Infusions: . sodium chloride    . sodium chloride 1 mL/kg/hr (03/01/17 0730)   PRN Meds:.sodium chloride, albuterol, bisacodyl, HYDROcodone-acetaminophen, [DISCONTINUED] ondansetron **OR** ondansetron (ZOFRAN) IV, polyethylene glycol, sodium chloride flush   PHYSICAL EXAM: Vital signs: Vitals:   02/28/17 1205 02/28/17 1418 02/28/17 2103 03/01/17 0413  BP: 126/79  124/70 123/75  Pulse: 89  80 77  Resp: 18  20 (!) 27  Temp: 98.7 F (37.1 C)  97.8 F (36.6 C) 97.8 F (36.6 C)  TempSrc: Oral  Oral Oral  SpO2: 95% 95% 96% 95%  Weight:    77.3 kg (170 lb 8 oz)  Height:       Filed Weights   02/27/17 0518 02/28/17 0448 03/01/17 0413  Weight: 76.8 kg (169 lb 4.8 oz) 76.9 kg (169 lb 8 oz) 77.3 kg (170 lb 8 oz)   Body mass index is 22.49 kg/m.   Awake Alert, Oriented X 3, No new F.N deficits, Normal affect [place].AT,PERRAL Supple Neck,No JVD, No cervical lymphadenopathy appriciated.  Symmetrical Chest wall movement, Good air movement bilaterally, CTAB RRR,No Gallops,Rubs or new Murmurs, No Parasternal Heave +ve B.Sounds, Abd Soft, No tenderness, No organomegaly appriciated, No rebound - guarding or rigidity. No Cyanosis, Clubbing or edema, No new Rash or bruise   I have personally reviewed following labs and imaging studies  LABORATORY  DATA: CBC:  Recent Labs Lab 02/24/17 1421 02/24/17 1920 02/25/17 0220 03/01/17 0319  WBC 8.0 8.6 8.0 9.2  NEUTROABS 4.9 4.9  --   --   HGB  --  10.3* 9.1* 10.1*  HCT 32.1* 31.9* 28.5* 32.8*  MCV 82 81.6 80.7 83.5  PLT 364 335 283 094    Basic Metabolic Panel:  Recent Labs Lab 02/24/17 1920 02/25/17 0220 02/26/17 0400 02/27/17 0502 03/01/17 0319  NA 139 136 137 138 136  K 4.3 3.6 4.4 4.2 3.7  CL 105 105 102 101 103  CO2 _0 GLUCOSE 95 150* 156* 106* 87  BUN _1 27* 19  CREATININE 1.00 0.88 0.84 0.91 0.93  CALCIUM 9.2 8.7* 9.2 9.1 8.9  MG  --   --   --  2.3  --     GFR: Estimated Creatinine Clearance: 91.2 mL/min (by C-G formula based on SCr of 0.93 mg/dL).  Liver Function Tests:  Recent Labs Lab 02/24/17 1421  AST 10  ALT 8  ALKPHOS 118*  BILITOT 0.8  PROT 7.8  ALBUMIN 3.7   No results for input(s): LIPASE, AMYLASE in the last 168 hours. No results for input(s): AMMONIA in the  last 168 hours.  Coagulation Profile:  Recent Labs Lab 03/01/17 0427  INR 0.90    Cardiac Enzymes:  Recent Labs Lab 02/25/17 0220 02/25/17 0733  TROPONINI 0.03* <0.03    BNP (last 3 results) No results for input(s): PROBNP in the last 8760 hours.  HbA1C: No results for input(s): HGBA1C in the last 72 hours.  CBG:  Recent Labs Lab 02/25/17 0729 02/26/17 0614 02/27/17 0630 02/28/17 0554 03/01/17 0622  GLUCAP 174* 144* 104* 93 89    Lipid Profile:  Recent Labs  02/27/17 0502  CHOL 164  HDL 32*  LDLCALC 103*  TRIG 144  CHOLHDL 5.1    Thyroid Function Tests: No results for input(s): TSH, T4TOTAL, FREET4, T3FREE, THYROIDAB in the last 72 hours.  Anemia Panel: No results for input(s): VITAMINB12, FOLATE, FERRITIN, TIBC, IRON, RETICCTPCT in the last 72 hours.  Urine analysis:    Component Value Date/Time   COLORURINE YELLOW 02/19/2017 1342   APPEARANCEUR CLEAR 02/19/2017 1342   LABSPEC 1.011 02/19/2017 1342   PHURINE 5.0  02/19/2017 1342   GLUCOSEU NEGATIVE 02/19/2017 1342   HGBUR NEGATIVE 02/19/2017 1342   BILIRUBINUR NEGATIVE 02/19/2017 1342   KETONESUR NEGATIVE 02/19/2017 1342   PROTEINUR NEGATIVE 02/19/2017 1342   NITRITE NEGATIVE 02/19/2017 1342   LEUKOCYTESUR NEGATIVE 02/19/2017 1342    Sepsis Labs: Lactic Acid, Venous    Component Value Date/Time   LATICACIDVEN 1.61 02/19/2017 1433    MICROBIOLOGY: No results found for this or any previous visit (from the past 240 hour(s)).  RADIOLOGY STUDIES/RESULTS: Dg Chest 2 View  Result Date: 02/27/2017 CLINICAL DATA:  Shortness of Breath EXAM: CHEST  2 VIEW COMPARISON:  Chest radiograph February 24, 2017 and chest CT February 25, 2017 ; earlier chest radiograph May 27, 2004 FINDINGS: The interstitium is diffusely prominent. There is no appreciable airspace consolidation. Heart is borderline enlarged with pulmonary vascularity within normal limits. No adenopathy. Patient is status post coronary artery bypass grafting. There are fractures of the 3 superior-most sternal wires. No adenopathy. No pneumothorax. There are several old, healed rib fractures on the left superiorly. IMPRESSION: Diffuse interstitial prominence. Suspect a degree of chronic congestive heart failure, although there may well be underlying interstitial fibrosis. Both entities may be present concurrently. Stable cardiac prominence. Several sternal wires fractured. Patient is status post coronary artery bypass grafting. No adenopathy. Electronically Signed   By: Lowella Grip III M.D.   On: 02/27/2017 11:47   Dg Chest 2 View  Result Date: 02/24/2017 CLINICAL DATA:  Abnormal EKG. Follow-up chest radiograph requested. Initial encounter. EXAM: CHEST  2 VIEW COMPARISON:  Chest radiograph performed 02/19/2017 FINDINGS: The lungs are well-aerated. Peripheral scarring is noted bilaterally, with diffusely increased interstitial markings, concerning for worsening interstitial lung disease. There is no  evidence of pleural effusion or pneumothorax. The heart is borderline enlarged. The patient is status post median sternotomy. There are fractures of the superior most sternal wires. No acute osseous abnormalities are seen. IMPRESSION: 1. Peripheral scarring, with diffusely increased interstitial markings, concerning for worsening interstitial lung disease. Underlying pneumonia cannot be excluded. High-resolution CT is recommended to assess for interstitial lung disease, when and as deemed clinically appropriate. 2. Borderline cardiomegaly. Electronically Signed   By: Garald Balding M.D.   On: 02/24/2017 19:40   Dg Chest 2 View  Result Date: 02/19/2017 CLINICAL DATA:  Cough.  Weakness. EXAM: CHEST  2 VIEW COMPARISON:  Chest x-ray 02/09/2017, 05/27/2004 CT 02/09/2017. FINDINGS: Prior CABG. Fractured sternal sutures are noted. Stable cardiomegaly.  Diffuse interstitial prominence is again noted. Interstitial prominence has improved slightly suggesting improving interstitial edema and/or pneumonitis. A component chronic interstitial lung disease most likely present. Reference is made to prior CT report of 02/09/2017. No pleural effusion pneumothorax. No acute bony abnormality. IMPRESSION: 1. Prior CABG. Fractured sternal sutures again noted. Stable cardiomegaly. Diffuse interstitial prominence is again noted. Interstitial prominence has improved slightly suggesting improving interstitial edema and/or pneumonitis. 2. A component of chronic interstitial lung disease most likely present. Reference is made to prior CT report of 02/09/2017 . Electronically Signed   By: Marcello Moores  Register   On: 02/19/2017 13:45   Dg Chest 2 View  Result Date: 02/09/2017 CLINICAL DATA:  61 year old male with cough and shortness of breath for the past month. Hypertension. Post CABG. Smoker. Initial encounter. EXAM: CHEST  2 VIEW COMPARISON:  05/27/2004. FINDINGS: Significant change since prior examination. Diffuse increased lung markings,  possibly chronic although not able to exclude superimposed mild pulmonary vascular congestion. Given the significant change, lack of more recent films and patient's history smoking, chest CT with high-resolution imaging may be considered for further delineation. Fracture of the left first through fifth ribs new from prior exam although appearing chronic. No pneumothorax. Post CABG with fractured sternal wires.  Cardiomegaly. IMPRESSION: Significant change since prior examination. Diffuse increased lung markings, possibly chronic although not able to exclude superimposed mild pulmonary vascular congestion. Given the significant change, lack of more recent films and patient's history of smoking, chest CT with high-resolution imaging may be considered for further delineation. Fracture of the left first through fifth ribs new from prior exam although appearing chronic. Post CABG Cardiomegaly. Electronically Signed   By: Genia Del M.D.   On: 02/09/2017 11:35   Ct Chest Wo Contrast  Result Date: 02/09/2017 CLINICAL DATA:  Generalized weakness. Productive cough. Current smoker. Abnormal chest radiograph. EXAM: CT CHEST WITHOUT CONTRAST TECHNIQUE: Multidetector CT imaging of the chest was performed following the standard protocol without IV contrast. COMPARISON:  Chest radiograph from earlier today. FINDINGS: Cardiovascular: Mild cardiomegaly. No significant pericardial fluid/thickening. Left main, left anterior descending, left circumflex and right coronary atherosclerosis status post CABG. Atherosclerotic nonaneurysmal thoracic aorta. Top-normal caliber pulmonary arteries (main pulmonary artery diameter 3.2 cm). Mediastinum/Nodes: No discrete thyroid nodules. Unremarkable esophagus. No pathologically enlarged axillary lymph nodes. Right paratracheal adenopathy measuring up to 1.2 cm (series 201/ image 47). Enlarged 1.4 cm subcarinal node (series 201/ image 60). AP window adenopathy measuring up to 1.0 cm (series  201/ image 49). Additional pathologically enlarged mediastinal or gross hilar nodes on this noncontrast scan. Lungs/Pleura: No pneumothorax. No pleural effusion. Mild centrilobular and paraseptal emphysema with diffuse bronchial wall thickening. Anterior right upper lobe 7 mm solid pulmonary nodule (series 205/ image 44). No acute consolidative airspace disease, lung masses or additional significant pulmonary nodules. There is patchy ground-glass attenuation and reticulation involving the subpleural greater than peribronchovascular lungs, without a clear basilar gradient. There is a suggestion of associated mild traction bronchiolectasis and mild architectural distortion, with no frank honeycombing. There is mild interlobular septal thickening throughout both lungs. Upper abdomen: Unremarkable. Musculoskeletal: No aggressive appearing focal osseous lesions. Stable discontinuities in the 3 most superior sternotomy wires. Additional sternotomy wires appear intact . Subacute healing left posterior second through seventh rib fractures. Moderate thoracic spondylosis. IMPRESSION: 1. Cardiomegaly with complex spectrum of pulmonary parenchymal findings suggestive of mild pulmonary edema superimposed on a fibrotic interstitial lung disease. Given the absence of frank honeycombing and the absence of a clear basilar gradient, the  suspected underlying interstitial lung disease is most compatible with nonspecific interstitial pneumonia (NSIP), however early usual interstitial pneumonia (UIP) cannot be excluded and a follow-up high-resolution chest CT is warranted in 6-12 months. 2. Solid 7 mm right upper lobe pulmonary nodule. Non-contrast chest CT at 6-12 months is recommended. If the nodule is stable at time of repeat CT, then future CT at 18-24 months (from today's scan) is considered optional for low-risk patients, but is recommended for high-risk patients. This recommendation follows the consensus statement: Guidelines for  Management of Incidental Pulmonary Nodules Detected on CT Images: From the Fleischner Society 2017; Radiology 2017; 284:228-243. 3. Nonspecific mild mediastinal lymphadenopathy, more likely reactive. 4. Aortic atherosclerosis. Left main and 3 vessel coronary atherosclerosis status post CABG. 5. Mild emphysema and diffuse bronchial wall thickening, suggesting COPD. 6. Subacute healing posterior left second through seventh rib fractures. No pneumothorax. No pleural effusions. Electronically Signed   By: Ilona Sorrel M.D.   On: 02/09/2017 16:36   Ct Angio Chest Pe W Or Wo Contrast  Result Date: 02/25/2017 CLINICAL DATA:  61 year old male with hypoxia and tachycardia. EXAM: CT ANGIOGRAPHY CHEST WITH CONTRAST TECHNIQUE: Multidetector CT imaging of the chest was performed using the standard protocol during bolus administration of intravenous contrast. Multiplanar CT image reconstructions and MIPs were obtained to evaluate the vascular anatomy. CONTRAST:  100 cc Isovue 370 COMPARISON:  Chest radiograph dated 02/24/2017 and CT dated 02/09/2017 FINDINGS: Cardiovascular: There mild to moderate cardiomegaly with enlargement of the left ventricle. There is focal area of aneurysmal dilatation with thinning of the myometrium involving the lateral ventricular wall. Multi vessel coronary vascular calcification and CABG surgery noted. There is no pericardial effusion. The thoracic aorta appears unremarkable. There is mild atherosclerotic calcification of the aorta. Evaluation of the pulmonary arteries is limited due to suboptimal opacification of the peripheral branches. No definite central pulmonary artery embolus identified. V/Q scan may provide better evaluation if there is high clinical concern for acute PE. Mediastinum/Nodes: There is no hilar adenopathy. Top-normal right hilar lymph node. Subcarinal lymph node measures approximately 15 mm in short axis. The esophagus is grossly unremarkable. No mediastinal fluid  collection. Lungs/Pleura: There is paraseptal emphysema. There is diffuse interstitial coarsening with mild diffuse bronchial wall thickening. There is ground-glass attenuation of the lung with areas of reticulation involving the subpleural and peribronchovascular lungs. There is a 7 mm right upper lobe subpleural nodule as seen on the prior CT. No acute consolidative changes noted. There is no pleural effusion or pneumothorax. The central airways are patent. Upper Abdomen: No acute abnormality. Musculoskeletal: Mild degenerate changes of the spine. Median sternotomy wires noted. Left posterior rib fractures as seen on the prior CT, incompletely healed at this time. No acute osseous pathology. Review of the MIP images confirms the above findings. IMPRESSION: 1. No acute intrathoracic pathology. No CT evidence of central pulmonary artery embolus. Evaluation of the peripheral pulmonary arteries is limited due to suboptimal opacification. V/Q scan may provide better evaluation if there is high clinical concern for acute PE. 2. Paraseptal emphysema with findings suggestive of underlying interstitial lung disease. No new consolidative changes. 3. A 7 mm right upper lobe pulmonary nodule. Non-contrast chest CT at 6-12 months is recommended. If the nodule is stable at time of repeat CT, then future CT at 18-24 months (from today's scan) is considered optional for low-risk patients, but is recommended for high-risk patients. This recommendation follows the consensus statement: Guidelines for Management of Incidental Pulmonary Nodules Detected on CT Images:  From the Fleischner Society 2017; Radiology 2017; 284:228-243. 4. Mildly enlarged subcarinal lymph node as seen on the prior study and indeterminate etiology, likely reactive. 5. Atherosclerotic calcification of the aorta with 3 vessel coronary vascular disease and CABG changes. 6. 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 7.  Multiple left posterior rib fractures with incomplete healing at this time. No new fractures. Electronically Signed   By: Anner Crete M.D.   On: 02/25/2017 04:20   Nm Myocar Multi W/spect W/wall Motion / Ef  Result Date: 02/28/2017  There was no ST segment deviation noted during stress.  T wave inversion was noted during stress in the I, II, aVF, V4, V5 and V6 leads. T wave inversion persisted.  Defect 1: There is a large defect present in the basal anterior, basal inferior, basal inferolateral, basal anterolateral, mid anterior, mid inferior, mid inferolateral, mid anterolateral and apical lateral location.  Findings consistent with prior myocardial infarction with peri-infarct ischemia.  This is a high risk study.  The left ventricular ejection fraction is severely decreased (<30%).  This is a high risk study. There is a scar in the basal and mid inferior and inferolateral walls and ischemia in the apical lateral, basal and mid anterolateral and anterior walls suspicious for infarct in the LCX and ischemia in the LCX and LAD or LM artery. LV is severely dilated with severe LV dysfunction. There is akinesis in the basal and mid inferior, inferolateral walls, paradoxical septal motion and overall diffuse hypokinesis. A cardiac catheterization is recommended.     LOS: 5 days   Signature  Lala Lund M.D on 03/01/2017 at 9:01 AM  Between 7am to 7pm - Pager - [phone] ( page via [email or web address], text pages only, please mention full 10 digit call back number).  After 7pm go to [email or web address] - password [email or web address]

## 2017-03-01 NOTE — Progress Notes (Signed)
   03/01/17 1000  PT Visit Information  Last PT Received On 03/01/17  Assistance Needed +1  History of Present Illness Admitted with acute hypoxic respiratory failure, cough and abnormal EKG from PCP office. PMH: CAD with CABG, CVA with residual L side weakness, HTN.  Subjective Data  Subjective I have been walking as much as I can this weekend  Patient Stated Goal to get home  Precautions  Precautions Fall  Restrictions  Weight Bearing Restrictions No  Pain Assessment  Pain Assessment No/denies pain  Cognition  Arousal/Alertness Awake/alert  Behavior During Therapy WFL for tasks assessed/performed  Overall Cognitive Status Within Functional Limits for tasks assessed  Bed Mobility  Overal bed mobility Modified Independent  Transfers  Overall transfer level Modified independent  Equipment used Straight cane;1 person hand held assist  Ambulation/Gait  Ambulation/Gait assistance Min guard  Ambulation Distance (Feet) 180 Feet  Assistive device Straight cane  Gait Pattern/deviations Step-through pattern;Narrow base of support;Trunk flexed;Decreased stride length  General Gait Details better control of gait and min guard is for safety due to previous performance  Gait velocity reduced  Gait velocity interpretation Below normal speed for age/gender  Balance  Overall balance assessment History of Falls  General Comments  General comments (skin integrity, edema, etc.) Pt is expecting to leave today although no definitive orders are in to manage the plan.  Pt is ready for supervised home care  PT - End of Session  Equipment Utilized During Treatment Gait belt  Activity Tolerance Patient tolerated treatment well  Patient left in bed  Nurse Communication Mobility status  PT - Assessment/Plan  PT Plan Current plan remains appropriate  PT Visit Diagnosis Unsteadiness on feet (R26.81)  PT Frequency (ACUTE ONLY) Min 3X/week  Follow Up Recommendations Home health PT;Supervision -  Intermittent  PT equipment None recommended by PT  AM-PAC PT "6 Clicks" Daily Activity Outcome Measure  Difficulty turning over in bed (including adjusting bedclothes, sheets and blankets)? 4  Difficulty moving from lying on back to sitting on the side of the bed?  3  Difficulty sitting down on and standing up from a chair with arms (e.g., wheelchair, bedside commode, etc,.)? 3  Help needed moving to and from a bed to chair (including a wheelchair)? 3  Help needed walking in hospital room? 3  Help needed climbing 3-5 steps with a railing?  3  6 Click Score 19  Mobility G Code  CJ  PT Goal Progression  Progress towards PT goals Progressing toward goals  Acute Rehab PT Goals  PT Goal Formulation With patient/family  PT Time Calculation  PT Start Time (ACUTE ONLY) 0807  PT Stop Time (ACUTE ONLY) 9833  PT Time Calculation (min) (ACUTE ONLY) 25 min  PT General Charges  $$ ACUTE PT VISIT 1 Procedure  PT Treatments  $Gait Training 8-22 mins  $Therapeutic Exercise 8-22 mins  Samul Dada, PT MS Acute Rehab Dept. Number: [facility] [id number] and [facility] [phone]

## 2017-03-02 ENCOUNTER — Telehealth: Payer: Self-pay | Admitting: Cardiology

## 2017-03-02 LAB — CBC
HCT: 34.4 % — ABNORMAL LOW (ref 39.0–52.0)
Hemoglobin: 10.8 g/dL — ABNORMAL LOW (ref 13.0–17.0)
MCH: 26 pg (ref 26.0–34.0)
MCHC: 31.4 g/dL (ref 30.0–36.0)
MCV: 82.9 fL (ref 78.0–100.0)
PLATELETS: 342 10*3/uL (ref 150–400)
RBC: 4.15 MIL/uL — AB (ref 4.22–5.81)
RDW: 17.1 % — AB (ref 11.5–15.5)
WBC: 9.5 10*3/uL (ref 4.0–10.5)

## 2017-03-02 LAB — BASIC METABOLIC PANEL
ANION GAP: 7 (ref 5–15)
BUN: 18 mg/dL (ref 6–20)
CALCIUM: 8.7 mg/dL — AB (ref 8.9–10.3)
CO2: 25 mmol/L (ref 22–32)
Chloride: 104 mmol/L (ref 101–111)
Creatinine, Ser: 0.78 mg/dL (ref 0.61–1.24)
GLUCOSE: 91 mg/dL (ref 65–99)
Potassium: 3.9 mmol/L (ref 3.5–5.1)
SODIUM: 136 mmol/L (ref 135–145)

## 2017-03-02 LAB — HISTONE ANTIBODIES, IGG, BLOOD: DNA-HISTONE: 0.8 U (ref 0.0–0.9)

## 2017-03-02 MED ORDER — ASPIRIN 81 MG PO CHEW: 81.0000 mg | CHEWABLE_TABLET | Freq: Every day | ORAL | 0 refills | Status: DC

## 2017-03-02 MED ORDER — ATORVASTATIN CALCIUM 40 MG PO TABS: 40.0000 mg | ORAL_TABLET | Freq: Every day | ORAL | 0 refills | Status: DC

## 2017-03-02 MED ORDER — METOPROLOL SUCCINATE ER 50 MG PO TB24: 50.0000 mg | ORAL_TABLET | Freq: Every day | ORAL | 0 refills | Status: DC

## 2017-03-02 MED ORDER — IPRATROPIUM-ALBUTEROL 0.5-2.5 (3) MG/3ML IN SOLN: 3.0000 mL | Freq: Two times a day (BID) | RESPIRATORY_TRACT | Status: DC

## 2017-03-02 MED ORDER — SPIRONOLACTONE 25 MG PO TABS: 25.0000 mg | ORAL_TABLET | Freq: Every day | ORAL | 0 refills | Status: DC

## 2017-03-02 MED ORDER — PREDNISONE 5 MG PO TABS: 5.0000 mg | ORAL_TABLET | Freq: Two times a day (BID) | ORAL | 0 refills | Status: DC

## 2017-03-02 MED ORDER — LOSARTAN POTASSIUM 25 MG PO TABS: 25.0000 mg | ORAL_TABLET | Freq: Every day | ORAL | 0 refills | Status: DC

## 2017-03-02 NOTE — Progress Notes (Signed)
.  Call placed to [facility] to notify of telemetry monitoring d/c.  Pt's IV access has been discontinued. Pt is dressed, ready for DC. DC paperwork reviewed with patient and sister. Pt's sister will be transporting pt home. All prescriptions have been reviewed with patient, his entresto discount card was provided to him by Serita Kyle, Case Manager.

## 2017-03-02 NOTE — Discharge Instructions (Signed)
Follow with Primary MD [name], PA-C in 7 days   Get CBC, CMP, 2 view Chest X ray checked  by Primary MD or SNF MD in 5-7 days ( we routinely change or add medications that can affect your baseline labs and fluid status, therefore we recommend that you get the mentioned basic workup next visit with your PCP, your PCP may decide not to get them or add new tests based on their clinical decision)  Activity: As tolerated with Full fall precautions use walker/cane & assistance as needed  Disposition Home   Diet:   Heart Healthy , Check your Weight same time everyday, if you gain over 2 pounds, or you develop in leg swelling, experience more shortness of breath or chest pain, call your Primary MD immediately. Follow Cardiac Low Salt Diet and 1.5 lit/day fluid restriction.  On your next visit with your primary care physician please Get Medicines reviewed and adjusted.  Please request your Prim.MD to go over all Hospital Tests and Procedure/Radiological results at the follow up, please get all Hospital records sent to your Prim MD by signing hospital release before you go home.  If you experience worsening of your admission symptoms, develop shortness of breath, life threatening emergency, suicidal or homicidal thoughts you must seek medical attention immediately by calling 911 or calling your MD immediately  if symptoms less severe.  You Must read complete instructions/literature along with all the possible adverse reactions/side effects for all the Medicines you take and that have been prescribed to you. Take any new Medicines after you have completely understood and accpet all the possible adverse reactions/side effects.   Do not drive, operate heavy machinery, perform activities at heights, swimming or participation in water activities or provide baby sitting services if your were admitted for syncope or siezures until you have seen by Primary MD or a Neurologist and advised to do so  again.  Do not drive when taking Pain medications.    Do not take more than prescribed Pain, Sleep and Anxiety Medications  Special Instructions: If you have smoked or chewed Tobacco  in the last 2 yrs please stop smoking, stop any regular Alcohol  and or any Recreational drug use.  Wear Seat belts while driving.   Please note  You were cared for by a hospitalist during your hospital stay. If you have any questions about your discharge medications or the care you received while you were in the hospital after you are discharged, you can call the unit and asked to speak with the hospitalist on call if the hospitalist that took care of you is not available. Once you are discharged, your primary care physician will handle any further medical issues. Please note that NO REFILLS for any discharge medications will be authorized once you are discharged, as it is imperative that you return to your primary care physician (or establish a relationship with a primary care physician if you do not have one) for your aftercare needs so that they can reassess your need for medications and monitor your lab values.

## 2017-03-02 NOTE — Telephone Encounter (Signed)
New message    Pt TCM appt is scheduled May 8th at 1030 with Danya Dunn per Marlon Pel.

## 2017-03-02 NOTE — Progress Notes (Signed)
Progress Note  Patient Name: [name] Date of Encounter: 03/02/2017  Primary Cardiologist:  [facility], Dr. Delton See  Subjective   No CP,SOB, or Le swelling. Feeling much better over all  Inpatient Medications    Scheduled Meds: . aspirin  81 mg Oral Daily  . aspirin  81 mg Oral Daily  . atorvastatin  40 mg Oral q1800  . enoxaparin (LOVENOX) injection  40 mg Subcutaneous Q24H  . feeding supplement (ENSURE ENLIVE)  237 mL Oral BID BM  . ipratropium-albuterol  3 mL Nebulization BID  . losartan  25 mg Oral Daily  . metoprolol succinate  50 mg Oral Daily  . predniSONE  5 mg Oral Q breakfast  . sodium chloride flush  3 mL Intravenous Q12H  . sodium chloride flush  3 mL Intravenous Q12H  . spironolactone  25 mg Oral Daily  . varenicline  0.5 mg Oral BID   Followed by  . [START ON 03/04/2017] varenicline  1 mg Oral BID   Continuous Infusions: . sodium chloride     PRN Meds: sodium chloride, acetaminophen, albuterol, bisacodyl, HYDROcodone-acetaminophen, [DISCONTINUED] ondansetron **OR** ondansetron (ZOFRAN) IV, oxyCODONE-acetaminophen, polyethylene glycol, sodium chloride flush   Vital Signs    Vitals:   03/01/17 1341 03/01/17 2051 03/01/17 2209 03/02/17 0711  BP:   99/66 102/70  Pulse: 77  91 (!) 108  Resp: 18  17 18   Temp:   97.7 F (36.5 C) 98 F (36.7 C)  TempSrc:   Oral Oral  SpO2: 95% 96% 98% 97%  Weight:    168 lb 4.8 oz (76.3 kg)  Height:        Intake/Output Summary (Last 24 hours) at 03/02/17 0734 Last data filed at 03/02/17 0711  Gross per 24 hour  Intake             1280 ml  Output             2450 ml  Net            -1170 ml   Filed Weights   02/28/17 0448 03/01/17 0413 03/02/17 0711  Weight: 169 lb 8 oz (76.9 kg) 170 lb 8 oz (77.3 kg) 168 lb 4.8 oz (76.3 kg)    Telemetry    Sinus rhythm. PVCs.  Personally Reviewed  ECG    HR 78, incomplete LBBB, sinus rhythm - Personally Reviewed  Physical Exam   GEN: Well nourished, well  developed HEENT: normal  Neck: no JVD, carotid bruits, or masses Cardiac: RRR. no murmurs, rubs, or gallops,no edema. Intact distal pulses bilaterally.  Respiratory: crackles GI: soft, nontender, nondistended, + BS MS: no deformity or atrophy  Skin: warm and dry, no rash Neuro: Alert and Oriented x 3, Strength and sensation are intact Psych:   Full affect  Labs    Chemistry Recent Labs Lab 02/24/17 1421  02/27/17 0502 03/01/17 0319 03/01/17 1131 03/02/17 0420  NA 139  < > 138 136  --  136  K 4.8  < > 4.2 3.7  --  3.9  CL 100  < > 101 103  --  104  CO2 24  < > 29 28  --  25  GLUCOSE 88  < > 106* 87  --  91  BUN 9  < > 27* 19  --  18  CREATININE 0.83  < > 0.91 0.93 0.75 0.78  CALCIUM 9.0  < > 9.1 8.9  --  8.7*  PROT 7.8  --   --   --   --   --  ALBUMIN 3.7  --   --   --   --   --   AST 10  --   --   --   --   --   ALT 8  --   --   --   --   --   ALKPHOS 118*  --   --   --   --   --   BILITOT 0.8  --   --   --   --   --   GFRNONAA 95  < > >60 >60 >60 >60  GFRAA 110  < > >60 >60 >60 >60  ANIONGAP  --   < > 8 5  --  7  < > = values in this interval not displayed.   Hematology Recent Labs Lab 03/01/17 0319 03/01/17 1131 03/02/17 0420  WBC 9.2 8.5 9.5  RBC 3.93* 4.32 4.15*  HGB 10.1* 11.3* 10.8*  HCT 32.8* 35.8* 34.4*  MCV 83.5 82.9 82.9  MCH 25.7* 26.2 26.0  MCHC 30.8 31.6 31.4  RDW 16.9* 16.8* 17.1*  PLT 356 303 342    Cardiac Enzymes Recent Labs Lab 02/25/17 0220 02/25/17 0733  TROPONINI 0.03* <0.03    Recent Labs Lab 02/24/17 1940 02/24/17 2256  TROPIPOC 0.00 0.02     BNP Recent Labs Lab 02/24/17 1920  BNP 576.5*     DDimer No results for input(s): DDIMER in the last 168 hours.   Radiology    No results found.  Cardiac Studies   LHC 03/01/17:  Severe native vessel coronary disease with total occlusion of the left main, ramus intermedius, circumflex, and LAD.  Bypass graft failure with occlusion of the SVG to the distal RCA, and  occlusion of the sequential SVG to the circumflex/marginal system.  Patent LIMA to the LAD. The very distal 70% stenosis in the apical LAD beyond the graft insertion site.  Patent native right coronary with segmental mid vessel disease in the 50-70% range. The distal RCA gives collaterals via septal perforators and to the circumflex. Collaterals to the circumflex territory faintly opacify the native circumflex.  Markedly dilated and severely hypocontractile left ventricle with an estimated ejection fraction less than 30%. The LV the EDP is at the upper limit of normal.  Echo 02/26/17: Study Conclusions  - Left ventricle: The cavity size was mildly dilated. Wall thickness was increased in a pattern of mild LVH. There was focal basal hypertrophy. Systolic function was moderately reduced. The estimated ejection fraction was in the range of 35% to 40%. Diffuse hypokinesis. Akinesis of the basal-midinferior myocardium. Doppler parameters are consistent with abnormal left ventricular relaxation (grade 1 diastolic dysfunction). - Mitral valve: There was mild regurgitation. - Left atrium: The atrium was mildly to moderately dilated. - Right atrium: The atrium was mildly dilated. - Pulmonary arteries: Systolic pressure was moderately increased. PA peak pressure: 42 mm Hg (S).  Lexiscan Myoview 02/27/17:  There was no ST segment deviation noted during stress.  T wave inversion was noted during stress in the I, II, aVF, V4, V5 and V6 leads. T wave inversion persisted.  Defect 1: There is a large defect present in the basal anterior, basal inferior, basal inferolateral, basal anterolateral, mid anterior, mid inferior, mid inferolateral, mid anterolateral and apical lateral location.  Findings consistent with prior myocardial infarction with peri-infarct ischemia.  This is a high risk study.  The left ventricular ejection fraction is severely decreased (<30%).    Patient  Profile  61 y.o. male with AAA, CAD s/p CABG, prior stroke, hypertension, hyperlipidemia and tobacco abuse here with   Assessment & Plan    # Acute systolic and diastolic heart failure: He is net negative 4.1 Liters since admission. XL Toprol started yesterday,  Spironolactone 25 mg daily was iinitiated and he was switched from lisinopril to Valsartan, with the plan, per Dr. Beulah Gandy to [facility] as an outpatient if BP will tolerate.  # CAD s/p CABG: Mr. Yoho underwent LHC today and was found to have severe native vessel disease with occlusion of the LM, RI, and LAD.  SVG to RCA and sequential graft to the LCx/OM system are also occluded.  LIMA to LAD is patent, but there is a 70% apical LAD lesion distal to the graft insertion. No interventions occurred and medical therapy was recommended.  Continue aspirin.   # Tobacco abuse:  Stressed the importance of smoking cessation. Started Chantix 4/29  # Hypertension:  BP controlled.  Metoprolol and stat valsartan as above.   # Hyperlipidemia: Atorvastatin was increased to 40 mg this admission due to LDL 103.   Today's Weight: 168 lb        Admission Weight:  173 lb Blood Pressure:  102/70  Labs:      -- Creatinine 0.78     -- K 3.9 , Na 136 , WBC 9.5, Hbg 10.8      Signed, Jaevin Medearis G, PA-C  03/02/2017, 7:34 AM

## 2017-03-02 NOTE — Discharge Summary (Signed)
[name] YYQ:[id number] DOB: Nov 16, 1955 DOA: 02/24/2017  PCP: [name], PA-C  Admit date: 02/24/2017  Discharge date: 03/02/2017  Admitted From: Home   Disposition:  Home   Recommendations for Outpatient Follow-up:   Follow up with PCP in 1-2 weeks  PCP Please obtain BMP/CBC, 2 view CXR in 1week,  (see Discharge instructions)   PCP Please follow up on the following pending results: None   Home Health: None   Equipment/Devices: None  Consultations: Cards, Pulmonary Discharge Condition: Stable   CODE STATUS: Full   Diet Recommendation:  Heart Healthy    Chief Complaint  Patient presents with  . Abnormal ECG    follow up per PCP     Brief history of present illness from the day of admission and additional interim summary    Patient is a 61 y.o. male with remote history of CAD status post CABG, CVA with minimal left-sided deficits, long-standing tobacco use who presented to the ED for several months history of exertional dyspnea that has been progressively worsening, further workup suggested acute on chronic systolic CHF with positive Lexiscan. Cardiology and pulmonary following.                                                                  Hospital Course    Acute hypoxemic respiratory failure due to acute on chronic systolic CHF EF 88%.: Seen by cardiology and pulmonary, good response to IV diuretics suggesting this most likely is CHF. ILD workup in progress-PFTs/autoimmune workup ongoing with so far high ESR, high CCP and rheumatoid factor, pending histone antibodies, other serological tests negative. Per pulmonary taper off oral steroids which are being tapered, thereafter outpatient follow-up with [name].   Continue Aldactone, ARB  and beta blocker along with aspirin and statin  for secondary prevention of CAD and CHF, his Lexiscan was positive and thereafter had left heart catheterization which showed CAD and recommendation was medical treatment which will be continued, he will follow with PCP and cardiology along with pulmonary outpatient post discharge. He so far 4 L negative and his current weight is 168 pounds.     Interstitial lung disease with 7 mm right lung nodule: ANA negative but high ESR, high CCP and rheumatoid factor was high, previous M.D. discussed the case with [name] who recommends prednisone taper in the next 1 week. [name] will follow patient as outpatient-appointment scheduled for 04/01/17. Patient has been counseled to quit smoking as well.   Acute on chronic systolic heart failure: as #1 above.[name]  NSVT: Seen on telemetry-ocular and 4/26-continue beta blocker-we will recheck electrolytes including magnesium tomorrow morning.  History of CAD status post CABG in the 1990s:  plan as in #1 above.  Normocytic anemia: Iron panel consistent with anemia of chronic disease. Continue  to monitor. No indication for transfusion. PCP to monitor anemia and do age-appropriate outpatient follow-up and workup.  History of recent CVA: Slight/mild left-sided deficits-continue aspirin and statin for secondary prevention.  Tobacco abuse: Counseled, on Chantix.  29m Right lung nodule: Will require a repeat CT chest in 6-12 months time he is a smoker. Outpt Pulm follow up.      Discharge diagnosis     Principal Problem:   CAD (coronary artery disease) Active Problems:   Interstitial lung disease (HCC)   Normocytic anemia   AAA (abdominal aortic aneurysm) (HCC)   Abnormal EKG   Acute respiratory failure with hypoxia (HCC)   History of stroke   Essential hypertension   Current smoker   Exertional shortness of breath   Nodule of right lung   S/P CABG (coronary artery bypass graft)   Pure hypercholesterolemia   Acute combined  systolic and diastolic heart failure ([place]    Discharge instructions    Discharge Instructions    Diet - low sodium heart healthy    Complete by:  As directed    Discharge instructions    Complete by:  As directed    Follow with Primary MD RClent Demark PA-C in 7 days   Get CBC, CMP, 2 view Chest X ray checked  by Primary MD or SNF MD in 5-7 days ( we routinely change or add medications that can affect your baseline labs and fluid status, therefore we recommend that you get the mentioned basic workup next visit with your PCP, your PCP may decide not to get them or add new tests based on their clinical decision)  Activity: As tolerated with Full fall precautions use walker/cane & assistance as needed  Disposition Home   Diet:   Heart Healthy , Check your Weight same time everyday, if you gain over 2 pounds, or you develop in leg swelling, experience more shortness of breath or chest pain, call your Primary MD immediately. Follow Cardiac Low Salt Diet and 1.5 lit/day fluid restriction.  On your next visit with your primary care physician please Get Medicines reviewed and adjusted.  Please request your Prim.MD to go over all Hospital Tests and Procedure/Radiological results at the follow up, please get all Hospital records sent to your Prim MD by signing hospital release before you go home.  If you experience worsening of your admission symptoms, develop shortness of breath, life threatening emergency, suicidal or homicidal thoughts you must seek medical attention immediately by calling 911 or calling your MD immediately  if symptoms less severe.  You Must read complete instructions/literature along with all the possible adverse reactions/side effects for all the Medicines you take and that have been prescribed to you. Take any new Medicines after you have completely understood and accpet all the possible adverse reactions/side effects.   Do not drive, operate heavy machinery, perform  activities at heights, swimming or participation in water activities or provide baby sitting services if your were admitted for syncope or siezures until you have seen by Primary MD or a Neurologist and advised to do so again.  Do not drive when taking Pain medications.    Do not take more than prescribed Pain, Sleep and Anxiety Medications  Special Instructions: If you have smoked or chewed Tobacco  in the last 2 yrs please stop smoking, stop any regular Alcohol  and or any Recreational drug use.  Wear Seat belts while driving.   Please note  You were cared for by a hospitalist  during your hospital stay. If you have any questions about your discharge medications or the care you received while you were in the hospital after you are discharged, you can call the unit and asked to speak with the hospitalist on call if the hospitalist that took care of you is not available. Once you are discharged, your primary care physician will handle any further medical issues. Please note that NO REFILLS for any discharge medications will be authorized once you are discharged, as it is imperative that you return to your primary care physician (or establish a relationship with a primary care physician if you do not have one) for your aftercare needs so that they can reassess your need for medications and monitor your lab values.   Increase activity slowly    Complete by:  As directed       Discharge Medications   Allergies as of 03/02/2017   No Known Allergies     Medication List    STOP taking these medications   aspirin 325 MG tablet Replaced by:  aspirin 81 MG chewable tablet   metoprolol tartrate 25 MG tablet Commonly known as:  LOPRESSOR     TAKE these medications   acetaminophen 500 MG tablet Commonly known as:  TYLENOL Take 1,000 mg by mouth every 6 (six) hours as needed for mild pain.   albuterol 108 (90 Base) MCG/ACT inhaler Commonly known as:  PROVENTIL HFA;VENTOLIN HFA Inhale 2  puffs into the lungs every 4 (four) hours as needed for wheezing or shortness of breath.   aspirin 81 MG chewable tablet Chew 1 tablet (81 mg total) by mouth daily. Start taking on:  03/03/2017 Replaces:  aspirin 325 MG tablet   atorvastatin 40 MG tablet Commonly known as:  LIPITOR Take 1 tablet (40 mg total) by mouth daily at 6 PM.   losartan 25 MG tablet Commonly known as:  COZAAR Take 1 tablet (25 mg total) by mouth daily. Start taking on:  03/03/2017   metoprolol succinate 50 MG 24 hr tablet Commonly known as:  TOPROL-XL Take 1 tablet (50 mg total) by mouth daily. Take with or immediately following a meal. Start taking on:  03/03/2017   MUSCLE RUB EX Apply 1 application topically as needed (for shoulder or knee pain).   predniSONE 5 MG tablet Commonly known as:  DELTASONE Take 1 tablet (5 mg total) by mouth 2 (two) times daily. What changed:  medication strength  how much to take   spironolactone 25 MG tablet Commonly known as:  ALDACTONE Take 1 tablet (25 mg total) by mouth daily. Start taking on:  03/03/2017       Follow-up Information    RAMASWAMY,MURALI, MD Follow up on 04/01/2017.   Specialty:  Pulmonary Disease Why:  930am Contact information: [place] [address] [phone]        [name], PA-C. Schedule an appointment as soon as possible for a visit in 1 week(s).   Specialty:  Physician Assistant Contact information: [place] [address] [place] III, MD. Schedule an appointment as soon as possible for a visit in 1 week(s).   Specialty:  Cardiology Contact information: [address] [place] [address] [phone]           Major procedures and Radiology Reports - PLEASE review detailed and final reports thoroughly  -     TTE - Left ventricle:  The cavity size was mildly dilated. Wallthickness was increased in a pattern of mild LVH. There was  focalbasal hypertrophy. Systolic function was moderately reduced. Theestimated ejection fraction was in the range of 35% to 40%.Diffuse hypokinesis. Akinesis of the basal-midinferiormyocardium. Doppler parameters are consistent with abnormal leftventricular relaxation (grade 1 diastolic dysfunction). - Mitral valve: There was mild regurgitation. - Left atrium: The atrium was mildly to moderately dilated. - Right atrium: The atrium was mildly dilated. - Pulmonary arteries: Systolic pressure was moderately increased. PA peak pressure: 42 mm Hg (S).  Lexiscan positive for reversible ischemia per cardiology.  CT angiogram chest showing 7 mm right-sided lung nodule.   LHC 03/01/17:  Severe native vessel coronary disease with total occlusion of the left main, ramus intermedius, circumflex, and LAD.  Bypass graft failure with occlusion of the SVG to the distal RCA, and occlusion of the sequential SVG to the circumflex/marginal system.  Patent LIMA to the LAD. The very distal 70% stenosis in the apical LAD beyond the graft insertion site.  Patent native right coronary with segmental mid vessel disease in the 50-70% range. The distal RCA gives collaterals via septal perforators and to the circumflex. Collaterals to the circumflex territory faintly opacify the native circumflex.  Markedly dilated and severely hypocontractile left ventricle with an estimated ejection fraction less than 30%. The LV the EDP is at the upper limit of normal.   Dg Chest 2 View  Result Date: 02/27/2017 CLINICAL DATA:  Shortness of Breath EXAM: CHEST  2 VIEW COMPARISON:  Chest radiograph February 24, 2017 and chest CT February 25, 2017 ; earlier chest radiograph May 27, 2004 FINDINGS: The interstitium is diffusely prominent. There is no appreciable airspace consolidation. Heart is borderline enlarged with pulmonary vascularity within normal limits. No adenopathy. Patient is status post coronary artery bypass grafting.  There are fractures of the 3 superior-most sternal wires. No adenopathy. No pneumothorax. There are several old, healed rib fractures on the left superiorly. IMPRESSION: Diffuse interstitial prominence. Suspect a degree of chronic congestive heart failure, although there may well be underlying interstitial fibrosis. Both entities may be present concurrently. Stable cardiac prominence. Several sternal wires fractured. Patient is status post coronary artery bypass grafting. No adenopathy. Electronically Signed   By: Lowella Grip III M.D.   On: 02/27/2017 11:47   Dg Chest 2 View  Result Date: 02/24/2017 CLINICAL DATA:  Abnormal EKG. Follow-up chest radiograph requested. Initial encounter. EXAM: CHEST  2 VIEW COMPARISON:  Chest radiograph performed 02/19/2017 FINDINGS: The lungs are well-aerated. Peripheral scarring is noted bilaterally, with diffusely increased interstitial markings, concerning for worsening interstitial lung disease. There is no evidence of pleural effusion or pneumothorax. The heart is borderline enlarged. The patient is status post median sternotomy. There are fractures of the superior most sternal wires. No acute osseous abnormalities are seen. IMPRESSION: 1. Peripheral scarring, with diffusely increased interstitial markings, concerning for worsening interstitial lung disease. Underlying pneumonia cannot be excluded. High-resolution CT is recommended to assess for interstitial lung disease, when and as deemed clinically appropriate. 2. Borderline cardiomegaly. Electronically Signed   By: Garald Balding M.D.   On: 02/24/2017 19:40   Dg Chest 2 View  Result Date: 02/19/2017 CLINICAL DATA:  Cough.  Weakness. EXAM: CHEST  2 VIEW COMPARISON:  Chest x-ray 02/09/2017, 05/27/2004 CT 02/09/2017. FINDINGS: Prior CABG. Fractured sternal sutures are noted. Stable cardiomegaly. Diffuse interstitial prominence is again noted. Interstitial prominence has improved slightly suggesting improving  interstitial edema and/or pneumonitis. A component chronic interstitial lung disease  most likely present. Reference is made to prior CT report of 02/09/2017. No pleural effusion pneumothorax. No acute bony abnormality. IMPRESSION: 1. Prior CABG. Fractured sternal sutures again noted. Stable cardiomegaly. Diffuse interstitial prominence is again noted. Interstitial prominence has improved slightly suggesting improving interstitial edema and/or pneumonitis. 2. A component of chronic interstitial lung disease most likely present. Reference is made to prior CT report of 02/09/2017 . Electronically Signed   By: Marcello Moores  Register   On: 02/19/2017 13:45   Dg Chest 2 View  Result Date: 02/09/2017 CLINICAL DATA:  61 year old male with cough and shortness of breath for the past month. Hypertension. Post CABG. Smoker. Initial encounter. EXAM: CHEST  2 VIEW COMPARISON:  05/27/2004. FINDINGS: Significant change since prior examination. Diffuse increased lung markings, possibly chronic although not able to exclude superimposed mild pulmonary vascular congestion. Given the significant change, lack of more recent films and patient's history smoking, chest CT with high-resolution imaging may be considered for further delineation. Fracture of the left first through fifth ribs new from prior exam although appearing chronic. No pneumothorax. Post CABG with fractured sternal wires.  Cardiomegaly. IMPRESSION: Significant change since prior examination. Diffuse increased lung markings, possibly chronic although not able to exclude superimposed mild pulmonary vascular congestion. Given the significant change, lack of more recent films and patient's history of smoking, chest CT with high-resolution imaging may be considered for further delineation. Fracture of the left first through fifth ribs new from prior exam although appearing chronic. Post CABG Cardiomegaly. Electronically Signed   By: Genia Del M.D.   On: 02/09/2017 11:35    Ct Chest Wo Contrast  Result Date: 02/09/2017 CLINICAL DATA:  Generalized weakness. Productive cough. Current smoker. Abnormal chest radiograph. EXAM: CT CHEST WITHOUT CONTRAST TECHNIQUE: Multidetector CT imaging of the chest was performed following the standard protocol without IV contrast. COMPARISON:  Chest radiograph from earlier today. FINDINGS: Cardiovascular: Mild cardiomegaly. No significant pericardial fluid/thickening. Left main, left anterior descending, left circumflex and right coronary atherosclerosis status post CABG. Atherosclerotic nonaneurysmal thoracic aorta. Top-normal caliber pulmonary arteries (main pulmonary artery diameter 3.2 cm). Mediastinum/Nodes: No discrete thyroid nodules. Unremarkable esophagus. No pathologically enlarged axillary lymph nodes. Right paratracheal adenopathy measuring up to 1.2 cm (series 201/ image 47). Enlarged 1.4 cm subcarinal node (series 201/ image 60). AP window adenopathy measuring up to 1.0 cm (series 201/ image 49). Additional pathologically enlarged mediastinal or gross hilar nodes on this noncontrast scan. Lungs/Pleura: No pneumothorax. No pleural effusion. Mild centrilobular and paraseptal emphysema with diffuse bronchial wall thickening. Anterior right upper lobe 7 mm solid pulmonary nodule (series 205/ image 44). No acute consolidative airspace disease, lung masses or additional significant pulmonary nodules. There is patchy ground-glass attenuation and reticulation involving the subpleural greater than peribronchovascular lungs, without a clear basilar gradient. There is a suggestion of associated mild traction bronchiolectasis and mild architectural distortion, with no frank honeycombing. There is mild interlobular septal thickening throughout both lungs. Upper abdomen: Unremarkable. Musculoskeletal: No aggressive appearing focal osseous lesions. Stable discontinuities in the 3 most superior sternotomy wires. Additional sternotomy wires appear  intact . Subacute healing left posterior second through seventh rib fractures. Moderate thoracic spondylosis. IMPRESSION: 1. Cardiomegaly with complex spectrum of pulmonary parenchymal findings suggestive of mild pulmonary edema superimposed on a fibrotic interstitial lung disease. Given the absence of frank honeycombing and the absence of a clear basilar gradient, the suspected underlying interstitial lung disease is most compatible with nonspecific interstitial pneumonia (NSIP), however early usual interstitial pneumonia (UIP) cannot be excluded and  a follow-up high-resolution chest CT is warranted in 6-12 months. 2. Solid 7 mm right upper lobe pulmonary nodule. Non-contrast chest CT at 6-12 months is recommended. If the nodule is stable at time of repeat CT, then future CT at 18-24 months (from today's scan) is considered optional for low-risk patients, but is recommended for high-risk patients. This recommendation follows the consensus statement: Guidelines for Management of Incidental Pulmonary Nodules Detected on CT Images: From the Fleischner Society 2017; Radiology 2017; 284:228-243. 3. Nonspecific mild mediastinal lymphadenopathy, more likely reactive. 4. Aortic atherosclerosis. Left main and 3 vessel coronary atherosclerosis status post CABG. 5. Mild emphysema and diffuse bronchial wall thickening, suggesting COPD. 6. Subacute healing posterior left second through seventh rib fractures. No pneumothorax. No pleural effusions. Electronically Signed   By: Ilona Sorrel M.D.   On: 02/09/2017 16:36   Ct Angio Chest Pe W Or Wo Contrast  Result Date: 02/25/2017 CLINICAL DATA:  61 year old male with hypoxia and tachycardia. EXAM: CT ANGIOGRAPHY CHEST WITH CONTRAST TECHNIQUE: Multidetector CT imaging of the chest was performed using the standard protocol during bolus administration of intravenous contrast. Multiplanar CT image reconstructions and MIPs were obtained to evaluate the vascular anatomy. CONTRAST:   100 cc Isovue 370 COMPARISON:  Chest radiograph dated 02/24/2017 and CT dated 02/09/2017 FINDINGS: Cardiovascular: There mild to moderate cardiomegaly with enlargement of the left ventricle. There is focal area of aneurysmal dilatation with thinning of the myometrium involving the lateral ventricular wall. Multi vessel coronary vascular calcification and CABG surgery noted. There is no pericardial effusion. The thoracic aorta appears unremarkable. There is mild atherosclerotic calcification of the aorta. Evaluation of the pulmonary arteries is limited due to suboptimal opacification of the peripheral branches. No definite central pulmonary artery embolus identified. V/Q scan may provide better evaluation if there is high clinical concern for acute PE. Mediastinum/Nodes: There is no hilar adenopathy. Top-normal right hilar lymph node. Subcarinal lymph node measures approximately 15 mm in short axis. The esophagus is grossly unremarkable. No mediastinal fluid collection. Lungs/Pleura: There is paraseptal emphysema. There is diffuse interstitial coarsening with mild diffuse bronchial wall thickening. There is ground-glass attenuation of the lung with areas of reticulation involving the subpleural and peribronchovascular lungs. There is a 7 mm right upper lobe subpleural nodule as seen on the prior CT. No acute consolidative changes noted. There is no pleural effusion or pneumothorax. The central airways are patent. Upper Abdomen: No acute abnormality. Musculoskeletal: Mild degenerate changes of the spine. Median sternotomy wires noted. Left posterior rib fractures as seen on the prior CT, incompletely healed at this time. No acute osseous pathology. Review of the MIP images confirms the above findings. IMPRESSION: 1. No acute intrathoracic pathology. No CT evidence of central pulmonary artery embolus. Evaluation of the peripheral pulmonary arteries is limited due to suboptimal opacification. V/Q scan may provide better  evaluation if there is high clinical concern for acute PE. 2. Paraseptal emphysema with findings suggestive of underlying interstitial lung disease. No new consolidative changes. 3. A 7 mm right upper lobe pulmonary nodule. Non-contrast chest CT at 6-12 months is recommended. If the nodule is stable at time of repeat CT, then future CT at 18-24 months (from today's scan) is considered optional for low-risk patients, but is recommended for high-risk patients. This recommendation follows the consensus statement: Guidelines for Management of Incidental Pulmonary Nodules Detected on CT Images: From the Fleischner Society 2017; Radiology 2017; 284:228-243. 4. Mildly enlarged subcarinal lymph node as seen on the prior study and indeterminate etiology,  likely reactive. 5. Atherosclerotic calcification of the aorta with 3 vessel coronary vascular disease and CABG changes. 6. 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 7. Multiple left posterior rib fractures with incomplete healing at this time. No new fractures. Electronically Signed   By: Anner Crete M.D.   On: 02/25/2017 04:20   Nm Myocar Multi W/spect W/wall Motion / Ef  Result Date: 02/28/2017  There was no ST segment deviation noted during stress.  T wave inversion was noted during stress in the I, II, aVF, V4, V5 and V6 leads. T wave inversion persisted.  Defect 1: There is a large defect present in the basal anterior, basal inferior, basal inferolateral, basal anterolateral, mid anterior, mid inferior, mid inferolateral, mid anterolateral and apical lateral location.  Findings consistent with prior myocardial infarction with peri-infarct ischemia.  This is a high risk study.  The left ventricular ejection fraction is severely decreased (<30%).  This is a high risk study. There is a scar in the basal and mid inferior and inferolateral walls and ischemia in the apical lateral, basal and mid anterolateral and  anterior walls suspicious for infarct in the LCX and ischemia in the LCX and LAD or LM artery. LV is severely dilated with severe LV dysfunction. There is akinesis in the basal and mid inferior, inferolateral walls, paradoxical septal motion and overall diffuse hypokinesis. A cardiac catheterization is recommended.    Micro Results     No results found for this or any previous visit (from the past 240 hour(s)).  Today   Subjective    Nnamdi Dacus today has no headache,no chest abdominal pain,no new weakness tingling or numbness, feels much better wants to go home today.     Objective   Blood pressure 117/78, pulse 90, temperature 98 F (36.7 C), temperature source Oral, resp. rate 18, height _0  (1.854 m), weight 76.3 kg (168 lb 4.8 oz), SpO2 97 %.   Intake/Output Summary (Last 24 hours) at 03/02/17 0915 Last data filed at 03/02/17 0739  Gross per 24 hour  Intake             1280 ml  Output             2450 ml  Net            -1170 ml    Exam Awake Alert, Oriented x 3, No new F.N deficits, Normal affect [place].AT,PERRAL Supple Neck,No JVD, No cervical lymphadenopathy appriciated.  Symmetrical Chest wall movement, Good air movement bilaterally, CTAB RRR,No Gallops,Rubs or new Murmurs, No Parasternal Heave +ve B.Sounds, Abd Soft, Non tender, No organomegaly appriciated, No rebound -guarding or rigidity. No Cyanosis, Clubbing or edema, No new Rash or bruise   Data Review   CBC w Diff:  Lab Results  Component Value Date   WBC 9.5 03/02/2017   HGB 10.8 (L) 03/02/2017   HCT 34.4 (L) 03/02/2017   HCT 32.1 (L) 02/24/2017   PLT 342 03/02/2017   PLT 364 02/24/2017   LYMPHOPCT 34 02/24/2017   MONOPCT 5 02/24/2017   EOSPCT 5 02/24/2017   BASOPCT 0 02/24/2017    CMP:  Lab Results  Component Value Date   NA 136 03/02/2017   NA 139 02/24/2017   K 3.9 03/02/2017   CL 104 03/02/2017   CO2 25 03/02/2017   BUN 18 03/02/2017   BUN 9 02/24/2017   CREATININE 0.78 03/02/2017     PROT 7.8 02/24/2017   ALBUMIN 3.7  02/24/2017   BILITOT 0.8 02/24/2017   ALKPHOS 118 (H) 02/24/2017   AST 10 02/24/2017   ALT 8 02/24/2017  .   Total Time in preparing paper work, data evaluation and todays exam - 5 minutes  Lala Lund M.D on 03/02/2017 at [place]  [phone]

## 2017-03-03 NOTE — Telephone Encounter (Signed)
Patient contacted regarding discharge from [facility] on 03/02/17.  I spoke with his sister-Cathy.  Sister requests I speak with her. (she is listed on DPR)  Patient understands to follow up with provider --Sister aware of appointment with Ronie Spies, PA on May 8,2018 at 10:30 at [facility]  Patient understands discharge instructions? Sister verbalizes understanding.  Patient understands medications and regiment? Sister reports pt understands how to take medications.   Patient understands to bring all medications to this visit? Sister aware.

## 2017-03-07 ENCOUNTER — Encounter: Payer: Self-pay | Admitting: Physician Assistant

## 2017-03-07 DIAGNOSIS — I5042 Chronic combined systolic (congestive) and diastolic (congestive) heart failure: Secondary | ICD-10-CM | POA: Insufficient documentation
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
## 2017-03-08 ENCOUNTER — Ambulatory Visit (INDEPENDENT_AMBULATORY_CARE_PROVIDER_SITE_OTHER): Payer: Medicaid Other | Admitting: Physician Assistant

## 2017-03-08 ENCOUNTER — Encounter: Payer: Self-pay | Admitting: Physician Assistant

## 2017-03-08 VITALS — BP 115/74 | HR 95 | Ht 73.0 in | Wt 177.1 lb

## 2017-03-08 DIAGNOSIS — E785 Hyperlipidemia, unspecified: Secondary | ICD-10-CM | POA: Diagnosis not present

## 2017-03-08 DIAGNOSIS — I714 Abdominal aortic aneurysm, without rupture, unspecified: Secondary | ICD-10-CM

## 2017-03-08 DIAGNOSIS — I10 Essential (primary) hypertension: Secondary | ICD-10-CM

## 2017-03-08 DIAGNOSIS — I25810 Atherosclerosis of coronary artery bypass graft(s) without angina pectoris: Secondary | ICD-10-CM

## 2017-03-08 DIAGNOSIS — I5042 Chronic combined systolic (congestive) and diastolic (congestive) heart failure: Secondary | ICD-10-CM

## 2017-03-08 DIAGNOSIS — I739 Peripheral vascular disease, unspecified: Secondary | ICD-10-CM | POA: Diagnosis not present

## 2017-03-08 MED ORDER — SPIRONOLACTONE 25 MG PO TABS: 25.0000 mg | ORAL_TABLET | Freq: Every day | ORAL | 2 refills | Status: DC

## 2017-03-08 NOTE — Patient Instructions (Addendum)
Medication Instructions:  Your physician recommends that you continue on your current medications as directed. Please refer to the Current Medication list given to you today.   Labwork: 1 WEEK AT TIME OF APPT WITH PHARM-D:  BMET & CBC  Testing/Procedures: Your physician has requested that you have an abdominal aorta duplex. During this test, an ultrasound is used to evaluate the aorta. Allow 30 minutes for this exam. Do not eat after midnight the day before and avoid carbonated beverages   Follow-Up: Your physician recommends that you schedule a follow-up appointment in: 1 WEEK THE PHARM-D IN THE BLOOD PRESSURE CLINIC, WITH LAB WORK  Your physician recommends that you schedule a follow-up appointment in: 4-5 WEEKS WITH DAYNA DUNN, PA-C OR APP ON DR. Lindaann Slough CARE TEAM   Any Other Special Instructions Will Be Listed Below (If Applicable).   Abdominal Aortic Aneurysm Blood pumps away from the heart through tubes (blood vessels) called arteries. Aneurysms are weak or damaged places in the wall of an artery. It bulges out like a balloon. An abdominal aortic aneurysm happens in the main artery of the body (aorta). It can burst or tear, causing bleeding inside the body. This is an emergency. It needs treatment right away. What are the causes? The exact cause is unknown. Things that could cause this problem include:  Fat and other substances building up in the lining of a tube.  Swelling of the walls of a blood vessel.  Certain tissue diseases.  Belly (abdominal) trauma.  An infection in the main artery of the body. What increases the risk? There are things that make it more likely for you to have an aneurysm. These include:  Being over the age of 62 years old.  Having high blood pressure (hypertension).  Being a male.  Being white.  Being very overweight (obese).  Having a family history of aneurysm.  Using tobacco products. What are the signs or symptoms? Symptoms depend  on the size of the aneurysm and how fast it grows. There may not be symptoms. If symptoms occur, they can include:  Pain (belly, side, lower back, or groin).  Feeling full after eating a small amount of food.  Feeling sick to your stomach (nauseous), throwing up (vomiting), or both.  Feeling a lump in your belly that feels like it is beating (pulsating).  Feeling like you will pass out (faint). How is this treated?  Medicine to control blood pressure and pain.  Imaging tests to see if the aneurysm gets bigger.  Surgery. How is this prevented? To lessen your chance of getting this condition:  Stop smoking. Stop chewing tobacco.  Limit or avoid alcohol.  Keep your blood pressure, blood sugar, and cholesterol within normal limits.  Eat less salt.  Eat foods low in saturated fats and cholesterol. These are found in animal and whole dairy products.  Eat more fiber. Fiber is found in whole grains, vegetables, and fruits.  Keep a healthy weight.  Stay active and exercise often. This information is not intended to replace advice given to you by your health care provider. Make sure you discuss any questions you have with your health care provider. Document Released: 02/13/2013 Document Revised: 03/26/2016 Document Reviewed: 11/18/2012 Elsevier Interactive Patient Education  2017 [employer].    If you need a refill on your cardiac medications before your next appointment, please call your pharmacy.

## 2017-03-09 ENCOUNTER — Ambulatory Visit: Payer: Self-pay | Admitting: Physician Assistant

## 2017-03-09 ENCOUNTER — Encounter (INDEPENDENT_AMBULATORY_CARE_PROVIDER_SITE_OTHER): Payer: Self-pay | Admitting: Physician Assistant

## 2017-03-09 ENCOUNTER — Ambulatory Visit (INDEPENDENT_AMBULATORY_CARE_PROVIDER_SITE_OTHER): Payer: Medicaid Other | Admitting: Physician Assistant

## 2017-03-09 VITALS — BP 114/78 | HR 98 | Temp 98.0°F | Resp 18 | Ht 73.0 in | Wt 172.0 lb

## 2017-03-09 DIAGNOSIS — J849 Interstitial pulmonary disease, unspecified: Secondary | ICD-10-CM

## 2017-03-09 DIAGNOSIS — Z23 Encounter for immunization: Secondary | ICD-10-CM

## 2017-03-09 DIAGNOSIS — F17200 Nicotine dependence, unspecified, uncomplicated: Secondary | ICD-10-CM

## 2017-03-09 DIAGNOSIS — Z1211 Encounter for screening for malignant neoplasm of colon: Secondary | ICD-10-CM | POA: Diagnosis not present

## 2017-03-09 DIAGNOSIS — Z1159 Encounter for screening for other viral diseases: Secondary | ICD-10-CM

## 2017-03-09 DIAGNOSIS — Z136 Encounter for screening for cardiovascular disorders: Secondary | ICD-10-CM | POA: Diagnosis not present

## 2017-03-09 DIAGNOSIS — R911 Solitary pulmonary nodule: Secondary | ICD-10-CM

## 2017-03-09 DIAGNOSIS — I2510 Atherosclerotic heart disease of native coronary artery without angina pectoris: Secondary | ICD-10-CM

## 2017-03-09 NOTE — Progress Notes (Signed)
Subjective:  Patient ID: [name], male    DOB: 16-Aug-1956  Age: 61 y.o. MRN: [id number]  CC: f/u   HPI [name] is a 61 y.o. male with a PMH of CAD s/p CABG, chronic combined diastolic and systolic heart failure, MI x3, Stroke, ILD, RA, and Tobacco abuse presents on f/u of hospital admission. He was sent to the ED from here due to abnormal EKG. He stayed in hospital from 02/24/17 to 03/02/17. Diagnosed with acute hypoxemic respiratory failure due to acute on chronic systolic heart failure EF 35%, Interstitial lung disease with 78m right lung nodule, and Normocytic anemia likely of chronic disease. Patient states he is feeling well. Taking meds as prescribed. His only worry is about getting the screening [place]. Reports having cut down drastically on cigarrettes. Not on chantix, could not get rx filled. Mild headache over the past few days relieved with Tylenol. Endorses no other symptoms.     Outpatient Medications Prior to Visit  Medication Sig Dispense Refill  . acetaminophen (TYLENOL) 500 MG tablet Take 1,000 mg by mouth every 6 (six) hours as needed for mild pain.     .Marland Kitchenaspirin 81 MG chewable tablet Chew 1 tablet (81 mg total) by mouth daily. 30 tablet 0  . atorvastatin (LIPITOR) 40 MG tablet Take 1 tablet (40 mg total) by mouth daily at 6 PM. 30 tablet 0  . diphenhydramine-acetaminophen (TYLENOL PM) 25-500 MG TABS tablet Take 1 tablet by mouth at bedtime as needed.    .Marland Kitchenlosartan (COZAAR) 25 MG tablet Take 1 tablet (25 mg total) by mouth daily. 30 tablet 0  . Menthol-Methyl Salicylate (MUSCLE RUB EX) Apply 1 application topically as needed (for shoulder or knee pain).     . metoprolol succinate (TOPROL-XL) 50 MG 24 hr tablet Take 1 tablet (50 mg total) by mouth daily. Take with or immediately following a meal. 30 tablet 0  . spironolactone (ALDACTONE) 25 MG tablet Take 1 tablet (25 mg total) by mouth daily. 30 tablet 2   No facility-administered medications prior to visit.       ROS Review of Systems  Constitutional: Negative for chills, fever and malaise/fatigue.  Eyes: Negative for blurred vision.  Respiratory: Positive for shortness of breath (very mild and only on exertion).   Cardiovascular: Negative for chest pain and palpitations.  Gastrointestinal: Negative for abdominal pain and nausea.  Genitourinary: Negative for dysuria and hematuria.  Musculoskeletal: Negative for joint pain and myalgias.  Skin: Negative for rash.  Neurological: Negative for tingling and headaches.  Psychiatric/Behavioral: Negative for depression. The patient is not nervous/anxious.     Objective:  BP 114/78 (BP Location: Right Arm, Patient Position: Sitting, Cuff Size: Normal)   Pulse 98   Temp 98 F (36.7 C) (Oral)   Resp 18   Ht 6' 1"  (1.854 m)   Wt 172 lb (78 kg)   SpO2 97%   BMI 22.69 kg/m   BP/Weight 03/09/2017 52/1/117355/04/7013 Systolic BP 110310131143 Diastolic BP 78 74 78  Wt. (Lbs) 172 177.12 168.3  BMI 22.69 23.37 -      Physical Exam  Constitutional: He is oriented to person, place, and time.  Well developed,centrally obese, NAD, polite  HENT:  Head: Normocephalic and atraumatic.  Eyes: No scleral icterus.  Neck: Normal range of motion.  Cardiovascular: Normal rate, regular rhythm and normal heart sounds.   Pulmonary/Chest: Effort normal and breath sounds normal.  Abdominal: Soft. Bowel sounds are  normal. There is no tenderness.  Pulsatile abdominal aorta, difficult to assess size due to central obesity.  Musculoskeletal: He exhibits no edema.  Kyphosis of T spine  Neurological: He is alert and oriented to person, place, and time.  Skin: Skin is warm and dry. No rash noted. No erythema. No pallor.  Psychiatric: He has a normal mood and affect. His behavior is normal. Thought content normal.  Vitals reviewed.    Assessment & Plan:   1. Screening for AAA (aortic abdominal aneurysm) - Appt scheduled for [place] AAA on April 02, 2017  2.  Coronary artery disease involving native coronary artery of native heart without angina pectoris - Continue on Atorvastatin 39m qhs  3. Interstitial lung disease (HCC) - Positive RF, Anti-CCP, ESR, and CRP. Negative ANA. - Ambulatory referral to Rheumatology  4. Need for hepatitis C screening test - Hepatitis C Antibody  5. Need for Tdap vaccination - Tdap vaccine greater than or equal to 7yo IM  6. Encounter for screening colonoscopy - Ambulatory referral to Gastroenterology  7. Pulmonary nodule - Pt has f/u with pulm - Repeat imaging in 6 months.  Follow-up: Return in about 3 months (around 06/09/2017).   RClent DemarkPA

## 2017-03-09 NOTE — Progress Notes (Signed)
Patient is here for FU  Patient has taken medication today. Patient has eaten today.   Patient tolerated tdap vaccine well today.

## 2017-03-09 NOTE — Patient Instructions (Signed)
Colonoscopy, Adult A colonoscopy is an exam to look at the entire large intestine. During the exam, a lubricated, bendable tube is inserted into the anus and then passed into the rectum, colon, and other parts of the large intestine. A colonoscopy is often done as a part of normal colorectal screening or in response to certain symptoms, such as anemia, persistent diarrhea, abdominal pain, and blood in the stool. The exam can help screen for and diagnose medical problems, including:  Tumors.  Polyps.  Inflammation.  Areas of bleeding. Tell a health care provider about:  Any allergies you have.  All medicines you are taking, including vitamins, herbs, eye drops, creams, and over-the-counter medicines.  Any problems you or family members have had with anesthetic medicines.  Any blood disorders you have.  Any surgeries you have had.  Any medical conditions you have.  Any problems you have had passing stool. What are the risks? Generally, this is a safe procedure. However, problems may occur, including:  Bleeding.  A tear in the intestine.  A reaction to medicines given during the exam.  Infection (rare). What happens before the procedure? Eating and drinking restrictions  Follow instructions from your health care provider about eating and drinking, which may include:  A few days before the procedure - follow a low-fiber diet. Avoid nuts, seeds, dried fruit, raw fruits, and vegetables.  1-3 days before the procedure - follow a clear liquid diet. Drink only clear liquids, such as clear broth or bouillon, black coffee or tea, clear juice, clear soft drinks or sports drinks, gelatin dessert, and popsicles. Avoid any liquids that contain red or purple dye.  On the day of the procedure - do not eat or drink anything during the 2 hours before the procedure, or within the time period that your health care provider recommends. Bowel prep  If you were prescribed an oral bowel prep to  clean out your colon:  Take it as told by your health care provider. Starting the day before your procedure, you will need to drink a large amount of medicated liquid. The liquid will cause you to have multiple loose stools until your stool is almost clear or light green.  If your skin or anus gets irritated from diarrhea, you may use these to relieve the irritation:  Medicated wipes, such as adult wet wipes with aloe and vitamin E.  A skin soothing-product like petroleum jelly.  If you vomit while drinking the bowel prep, take a break for up to 60 minutes and then begin the bowel prep again. If vomiting continues and you cannot take the bowel prep without vomiting, call your health care provider. General instructions   Ask your health care provider about changing or stopping your regular medicines. This is especially important if you are taking diabetes medicines or blood thinners.  Plan to have someone take you home from the hospital or clinic. What happens during the procedure?  An IV tube may be inserted into one of your veins.  You will be given medicine to help you relax (sedative).  To reduce your risk of infection:  Your health care team will wash or sanitize their hands.  Your anal area will be washed with soap.  You will be asked to lie on your side with your knees bent.  Your health care provider will lubricate a long, thin, flexible tube. The tube will have a camera and a light on the end.  The tube will be inserted into your anus.    The tube will be gently eased through your rectum and colon.  Air will be delivered into your colon to keep it open. You may feel some pressure or cramping.  The camera will be used to take images during the procedure.  A small tissue sample may be removed from your body to be examined under a microscope (biopsy). If any potential problems are found, the tissue will be sent to a lab for testing.  If small polyps are found, your health  care provider may remove them and have them checked for cancer cells.  The tube that was inserted into your anus will be slowly removed. The procedure may vary among health care providers and hospitals. What happens after the procedure?  Your blood pressure, heart rate, breathing rate, and blood oxygen level will be monitored until the medicines you were given have worn off.  Do not drive for 24 hours after the exam.  You may have a small amount of blood in your stool.  You may pass gas and have mild abdominal cramping or bloating due to the air that was used to inflate your colon during the exam.  It is up to you to get the results of your procedure. Ask your health care provider, or the department performing the procedure, when your results will be ready. This information is not intended to replace advice given to you by your health care provider. Make sure you discuss any questions you have with your health care provider. Document Released: 10/16/2000 Document Revised: 08/19/2016 Document Reviewed: 12/31/2015 Elsevier Interactive Patient Education  2017 Elsevier Inc.  

## 2017-03-10 LAB — HEPATITIS C ANTIBODY: Hep C Virus Ab: <0.1 s/co ratio (ref 0.0–0.9)

## 2017-03-12 ENCOUNTER — Encounter: Payer: Self-pay | Admitting: *Deleted

## 2017-03-12 NOTE — Progress Notes (Signed)
Pt was not sent a second time to [facility]. Seems patient went to [facility] after his ED visit.

## 2017-03-15 ENCOUNTER — Ambulatory Visit (INDEPENDENT_AMBULATORY_CARE_PROVIDER_SITE_OTHER): Payer: Medicaid Other | Admitting: Pharmacist

## 2017-03-15 ENCOUNTER — Other Ambulatory Visit: Payer: Self-pay | Admitting: *Deleted

## 2017-03-15 ENCOUNTER — Encounter: Payer: Self-pay | Admitting: Pharmacist

## 2017-03-15 VITALS — BP 120/80 | HR 101

## 2017-03-15 DIAGNOSIS — E7800 Pure hypercholesterolemia, unspecified: Secondary | ICD-10-CM

## 2017-03-15 DIAGNOSIS — E785 Hyperlipidemia, unspecified: Secondary | ICD-10-CM

## 2017-03-15 DIAGNOSIS — I739 Peripheral vascular disease, unspecified: Secondary | ICD-10-CM

## 2017-03-15 DIAGNOSIS — I10 Essential (primary) hypertension: Secondary | ICD-10-CM | POA: Diagnosis not present

## 2017-03-15 DIAGNOSIS — I5042 Chronic combined systolic (congestive) and diastolic (congestive) heart failure: Secondary | ICD-10-CM

## 2017-03-15 DIAGNOSIS — I714 Abdominal aortic aneurysm, without rupture, unspecified: Secondary | ICD-10-CM

## 2017-03-15 DIAGNOSIS — F17200 Nicotine dependence, unspecified, uncomplicated: Secondary | ICD-10-CM

## 2017-03-15 DIAGNOSIS — I25810 Atherosclerosis of coronary artery bypass graft(s) without angina pectoris: Secondary | ICD-10-CM

## 2017-03-15 MED ORDER — ATORVASTATIN CALCIUM 40 MG PO TABS: 40.0000 mg | ORAL_TABLET | Freq: Every day | ORAL | 11 refills | Status: AC

## 2017-03-15 MED ORDER — LISINOPRIL 10 MG PO TABS: 10.0000 mg | ORAL_TABLET | Freq: Every day | ORAL | 11 refills | Status: DC

## 2017-03-15 MED ORDER — SPIRONOLACTONE 25 MG PO TABS: 25.0000 mg | ORAL_TABLET | Freq: Every day | ORAL | 11 refills | Status: DC

## 2017-03-15 MED ORDER — CARVEDILOL 12.5 MG PO TABS: 12.5000 mg | ORAL_TABLET | Freq: Two times a day (BID) | ORAL | 11 refills | Status: DC
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
## 2017-03-15 NOTE — Patient Instructions (Addendum)
Increase your metoprolol dose to 100mg  - take 2 of your 50mg  at the same time each day. Once you run out, you'll stop this medicine and start carvedilol twice a day.    Once you finish your current supply of medication, pick up the following from [facility] where they will be cheaper:  Stop taking metoprolol, start taking carvedilol 12.5mg  twice a day  Stop taking losartan 25mg  daily, start taking lisinopril 10mg  daily  Pick up your spironolactone 25mg  once a day     Try to switch to 2% milk and look for low fat dairy products. Try to limit salt in your diet.  Congratulations on cutting back on your cigarette use - this is excellent for your health!   Continue to check your blood pressure at home and bring in your cuff to your next visit with , PA.  Call clinic with any concerns prior to your next appointment.

## 2017-03-16 LAB — CBC
Hematocrit: 36.4 % — ABNORMAL LOW (ref 37.5–51.0)
Hemoglobin: 11.6 g/dL — ABNORMAL LOW (ref 13.0–17.7)
MCH: 26.8 pg (ref 26.6–33.0)
MCHC: 31.9 g/dL (ref 31.5–35.7)
MCV: 84 fL (ref 79–97)
Platelets: 265 10*3/uL (ref 150–379)
RBC: 4.33 x10E6/uL (ref 4.14–5.80)
RDW: 16.7 % — AB (ref 12.3–15.4)
WBC: 8.6 10*3/uL (ref 3.4–10.8)

## 2017-03-16 LAB — BASIC METABOLIC PANEL
BUN/Creatinine Ratio: 10 (ref 10–24)
BUN: 9 mg/dL (ref 8–27)
CO2: 22 mmol/L (ref 18–29)
Calcium: 9.3 mg/dL (ref 8.6–10.2)
Chloride: 102 mmol/L (ref 96–106)
Creatinine, Ser: 0.88 mg/dL (ref 0.76–1.27)
GFR calc Af Amer: 107 mL/min/{1.73_m2} (ref >59)
GFR calc non Af Amer: 93 mL/min/{1.73_m2} (ref >59)
GLUCOSE: 90 mg/dL (ref 65–99)
Potassium: 4.6 mmol/L (ref 3.5–5.2)
SODIUM: 137 mmol/L (ref 134–144)

## 2017-03-26 ENCOUNTER — Telehealth (INDEPENDENT_AMBULATORY_CARE_PROVIDER_SITE_OTHER): Payer: Self-pay | Admitting: Physician Assistant

## 2017-03-26 NOTE — Telephone Encounter (Signed)
FWD to PCP. [name] S Ellinor Test, CMA  

## 2017-03-26 NOTE — Telephone Encounter (Signed)
[name] patient's sisiter called left voicemail stating patient having pain, and joint swollen, could sleep last night and has no strong medication only Tylenol but it is not help. would like to talk to Ryan Messing PA  Patient was informed no appt available for today.  But can go to ER/urgent care,  Or if wants to come to [facility] as walk-in and wait if someone does not show for appt.    Mrs [name] declined stated patient can not come in to [facility] can not sit up for long time.  Please follow up with patient.

## 2017-03-30 ENCOUNTER — Encounter (INDEPENDENT_AMBULATORY_CARE_PROVIDER_SITE_OTHER): Payer: Self-pay | Admitting: Physician Assistant

## 2017-03-30 ENCOUNTER — Ambulatory Visit (INDEPENDENT_AMBULATORY_CARE_PROVIDER_SITE_OTHER): Payer: Medicaid Other | Admitting: Physician Assistant

## 2017-03-30 VITALS — BP 122/83 | HR 72 | Temp 97.5°F | Resp 18 | Ht 73.0 in | Wt 185.0 lb

## 2017-03-30 DIAGNOSIS — M25511 Pain in right shoulder: Secondary | ICD-10-CM | POA: Diagnosis not present

## 2017-03-30 DIAGNOSIS — M25562 Pain in left knee: Secondary | ICD-10-CM | POA: Diagnosis not present

## 2017-03-30 DIAGNOSIS — M25561 Pain in right knee: Secondary | ICD-10-CM | POA: Diagnosis not present

## 2017-03-30 DIAGNOSIS — M25512 Pain in left shoulder: Secondary | ICD-10-CM | POA: Diagnosis not present

## 2017-03-30 MED ORDER — TRAMADOL HCL 50 MG PO TABS: 50.0000 mg | ORAL_TABLET | Freq: Three times a day (TID) | ORAL | 0 refills | Status: DC | PRN

## 2017-03-30 NOTE — Progress Notes (Signed)
Patient is here for joint swelling  Patient complains of right hand and right foot burning on top and left knee.  Patient has eaten today. Patient has taken medication today.

## 2017-03-30 NOTE — Progress Notes (Signed)
Subjective:  Patient ID: [name], male    DOB: 09/06/1956  Age: 61 y.o. MRN: [id number]  CC: Joint swelling and pain  HPI [name] is a 61 y.o. male with a PMH of CAD, MI, stroke, ILD, and tobacco abuse presents with arthralgia of the knee and shoulder bilaterally. Previous workup revealed positive RF, Anti-CCP, ESR, and CRP. Negative ANA. Patient having trouble sleeping due to pain. Is currently taking extra strength tylenol with little relief. Has not heard from rheumatology yet for an appointment. He has heard from Pulmonology and has an appointment in two days. Does not endorse any other complaints.       ROS Review of Systems  Constitutional: Negative for chills, fever and malaise/fatigue.  Eyes: Negative for blurred vision.  Respiratory: Negative for shortness of breath.   Cardiovascular: Negative for chest pain and palpitations.  Gastrointestinal: Negative for abdominal pain and nausea.  Genitourinary: Negative for dysuria and hematuria.  Musculoskeletal: Positive for joint pain. Negative for myalgias.  Skin: Negative for rash.  Neurological: Negative for tingling and headaches.  Psychiatric/Behavioral: Negative for depression. The patient is not nervous/anxious.     Objective:  BP 122/83 (BP Location: Right Arm, Patient Position: Sitting, Cuff Size: Normal)   Pulse 72   Temp 97.5 F (36.4 C) (Oral)   Resp 18   Ht 6' 1" (1.854 m)   Wt 185 lb (83.9 kg)   SpO2 100%   BMI 24.41 kg/m   BP/Weight 03/30/2017 4/62/7035 0/0/9381  Systolic BP 829 937 169  Diastolic BP 83 80 78  Wt. (Lbs) 185 - 172  BMI 24.41 - 22.69      Physical Exam  Constitutional: He is oriented to person, place, and time.  Well developed, well nourished, NAD, polite  HENT:  Head: Normocephalic and atraumatic.  Eyes: No scleral icterus.  Neck: Normal range of motion. Neck supple. No thyromegaly present.  Cardiovascular: Normal rate, regular rhythm and normal heart sounds.    Pulmonary/Chest: Effort normal and breath sounds normal.  Musculoskeletal: He exhibits no edema.  Mild bony hypertrophic changes of the right MCPs, greatest at the 2nd MCP. Shoulder and knees with mild tenderness to palpation, no gross deformities.   Neurological: He is alert and oriented to person, place, and time. No cranial nerve deficit. Coordination normal.  Skin: Skin is warm and dry. No rash noted. No erythema. No pallor.  Psychiatric: He has a normal mood and affect. His behavior is normal. Thought content normal.  Vitals reviewed.    Assessment & Plan:   1. Pain of both shoulder joints - likely 2/2 RA - No rheumatology appointment scheduled yet. We have called and left message for rheumatology office to call back in order to clarify/obtain appointment for pt. - Begin traMADol (ULTRAM) 50 MG tablet; Take 1 tablet (50 mg total) by mouth every 8 (eight) hours as needed.  Dispense: 90 tablet; Refill: 0  2. Arthralgia of both knees - likely 2/2 RA   Meds ordered this encounter  Medications  . traMADol (ULTRAM) 50 MG tablet    Sig: Take 1 tablet (50 mg total) by mouth every 8 (eight) hours as needed.    Dispense:  90 tablet    Refill:  0    Order Specific Question:   Supervising Provider    Answer:   Tresa Garter [[id number]]    Follow-up: Return in about 2 years (around 03/31/2019) for full physical.   [name] PA

## 2017-03-30 NOTE — Patient Instructions (Signed)
Rheumatoid Arthritis Rheumatoid arthritis (RA) is a long-term (chronic) disease that causes inflammation in your joints. RA may start slowly. It usually affects the small joints of the hands and feet. Usually, the same joints are affected on both sides of your body. Inflammation from RA can also affect other parts of your body, including your heart, eyes, or lungs. RA is an autoimmune disease. That means that your body's defense system (immune system) mistakenly attacks healthy body tissues. There is no cure for RA, but medicines can help your symptoms and halt or slow down the progression of the disease. What are the causes? The exact cause of RA is not known. What increases the risk? This condition is more likely to develop in:  Women.  People who have a family history of RA or other autoimmune diseases.  What are the signs or symptoms? Symptoms of this condition vary from person to person. Symptoms usually start gradually. They are often worse in the morning. The first symptom may be morning stiffness that lasts longer than 30 minutes. As RA progresses, symptoms may include:  Pain, stiffness, swelling, warmth, and tenderness in joints on both sides of your body.  Loss of energy.  Loss of appetite.  Weight loss.  Low-grade fever.  Dry eyes and dry mouth.  Firm lumps (rheumatoid nodules) that grow beneath your skin in areas such as your forearm bones near your elbows and on your hands.  Changes in the appearance of joints (deformity) and loss of joint function.  Symptoms of RA often come and go. Sometimes, symptoms get worse for a period of time. These are called flares. How is this diagnosed? This condition is diagnosed based on your symptoms, medical history, and physical exam. You may have X-rays or MRI to check for the type of joint changes that are caused by RA. You may also have blood tests to look for:  Proteins (antibodies) that your immune system may make if you have RA.  They include rheumatoid factor (RF) and anti-CCP. ? When blood tests show these proteins, you are said to have "seropositive RA." ? When blood tests do not show these proteins, you may have "seronegative RA."  Inflammation in your blood.  A low number of red blood cells (anemia).  How is this treated? The goals of treatment are to relieve pain, reduce inflammation, and slow down or stop joint damage and disability. Treatment may include:  Lifestyle changes. It is important to rest, eat a healthy diet, and exercise.  Medicines. Your health care provider may adjust your medicines every 3 months until treatment goals are reached. Common medicines include: ? Pain relievers (analgesics). ? Corticosteroids and NSAIDs to reduce inflammation. ? Disease-modifying antirheumatic drugs (DMARDs) to try to slow the course of the disease. ? Biologic response modifiers to reduce inflammation and damage.  Physical therapy and occupational therapy.  Surgery, if you have severe joint damage. Joint replacement or fusing of joints may be needed.  Your health care provider will work with you to identify the best treatment option for you based on assessment of the overall disease activity in your body. Follow these instructions at home:  Take over-the-counter and prescription medicines only as told by your health care provider.  Start an exercise program as told by your health care provider.  Rest when you are having a flare.  Return to your normal activities as told by your health care provider. Ask your health care provider what activities are safe for you.  Keep all   follow-up visits as told by your health care provider. This is important. Where to find more information:  American College of Rheumatology: www.rheumatology.org  Arthritis Foundation: www.arthritis.org Contact a health care provider if:  You have a flare-up of RA symptoms.  You have a fever.  You have side effects from your  medicines. Get help right away if:  You have chest pain.  You have trouble breathing.  You quickly develop a hot, painful joint that is more severe than your usual joint aches. This information is not intended to replace advice given to you by your health care provider. Make sure you discuss any questions you have with your health care provider. Document Released: 10/16/2000 Document Revised: 03/24/2016 Document Reviewed: 08/01/2015 Elsevier Interactive Patient Education  2017 Elsevier Inc.  

## 2017-04-01 ENCOUNTER — Encounter: Payer: Self-pay | Admitting: Internal Medicine

## 2017-04-01 ENCOUNTER — Ambulatory Visit (INDEPENDENT_AMBULATORY_CARE_PROVIDER_SITE_OTHER): Payer: Medicaid Other | Admitting: Internal Medicine

## 2017-04-01 DIAGNOSIS — R7989 Other specified abnormal findings of blood chemistry: Secondary | ICD-10-CM

## 2017-04-01 DIAGNOSIS — R768 Other specified abnormal immunological findings in serum: Secondary | ICD-10-CM | POA: Insufficient documentation

## 2017-04-01 DIAGNOSIS — F17200 Nicotine dependence, unspecified, uncomplicated: Secondary | ICD-10-CM | POA: Diagnosis not present

## 2017-04-01 DIAGNOSIS — R911 Solitary pulmonary nodule: Secondary | ICD-10-CM | POA: Diagnosis not present

## 2017-04-01 DIAGNOSIS — J849 Interstitial pulmonary disease, unspecified: Secondary | ICD-10-CM | POA: Diagnosis not present

## 2017-04-01 MED ORDER — ALBUTEROL SULFATE 0.63 MG/3ML IN NEBU: 1.0000 | INHALATION_SOLUTION | Freq: Four times a day (QID) | RESPIRATORY_TRACT | 12 refills | Status: DC | PRN

## 2017-04-01 NOTE — Assessment & Plan Note (Signed)
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 No lung volume or bd response. 2-3 months or sooner if needed

## 2017-04-01 NOTE — Assessment & Plan Note (Signed)
26mm RUL nodule April 2018  Plan  - HRCT in oct 2018
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
## 2017-04-01 NOTE — Addendum Note (Signed)
Addended by: Sheran Luz on: 04/01/2017 10:59 AM   Modules accepted: Orders

## 2017-04-01 NOTE — Patient Instructions (Signed)
Cyclic citrullinated peptide (CCP) antibody positive Clinically you have Rheumatoid arthritis in my opinion as lung doctor but I am no expert  Plan Refer Dr Corliss Skains - rheumatologist -  ? 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 No lung volume or bd response. 2-3 months or sooner if needed

## 2017-04-01 NOTE — Assessment & Plan Note (Signed)
Try to quit ASAP

## 2017-04-01 NOTE — Assessment & Plan Note (Signed)
Clinically you have Rheumatoid arthritis in my opinion as lung doctor but I am no expert  Plan Refer Dr Corliss Skains - rheumatologist -  ? Takes medicaid

## 2017-04-01 NOTE — Addendum Note (Signed)
Addended by: Sheran Luz on: 04/01/2017 10:27 AM   Modules accepted: Orders

## 2017-04-02 ENCOUNTER — Encounter (HOSPITAL_COMMUNITY): Payer: Self-pay

## 2017-04-02 ENCOUNTER — Telehealth: Payer: Self-pay | Admitting: Internal Medicine

## 2017-04-02 ENCOUNTER — Ambulatory Visit (HOSPITAL_COMMUNITY)
Admission: RE | Admit: 2017-04-02 | Discharge: 2017-04-02 | Disposition: A | Discharge status: Home / Self Care | Payer: Medicaid Other | Source: Ambulatory Visit | Attending: Cardiology | Admitting: Cardiology

## 2017-04-02 DIAGNOSIS — I714 Abdominal aortic aneurysm, without rupture, unspecified: Secondary | ICD-10-CM

## 2017-04-02 MED ORDER — ALBUTEROL SULFATE HFA 108 (90 BASE) MCG/ACT IN AERS: 2.0000 | INHALATION_SPRAY | Freq: Four times a day (QID) | RESPIRATORY_TRACT | 6 refills | Status: DC | PRN

## 2017-04-02 NOTE — Telephone Encounter (Signed)
Try to reach her after 10:00 am - has an appt this am and will be leaving around 8:45 am

## 2017-04-02 NOTE — Telephone Encounter (Signed)
Spoke with pt's sister, requesting albuterol inhaler refill.  This has been sent to preferred pharmacy.  Nothing further needed.

## 2017-04-05 ENCOUNTER — Ambulatory Visit (INDEPENDENT_AMBULATORY_CARE_PROVIDER_SITE_OTHER)
Admission: RE | Admit: 2017-04-05 | Discharge: 2017-04-05 | Disposition: A | Discharge status: Home / Self Care | Payer: Medicaid Other | Source: Ambulatory Visit | Attending: Internal Medicine | Admitting: Internal Medicine

## 2017-04-05 ENCOUNTER — Other Ambulatory Visit: Payer: Self-pay | Admitting: Physician Assistant

## 2017-04-05 DIAGNOSIS — I714 Abdominal aortic aneurysm, without rupture, unspecified: Secondary | ICD-10-CM

## 2017-04-05 DIAGNOSIS — J849 Interstitial pulmonary disease, unspecified: Secondary | ICD-10-CM

## 2017-04-07 LAB — NITRIC OXIDE: Nitric Oxide: 28

## 2017-04-07 NOTE — Addendum Note (Signed)
Addended by: Sheran Luz on: 04/07/2017 03:32 PM   Modules accepted: Orders

## 2017-04-08 ENCOUNTER — Telehealth (INDEPENDENT_AMBULATORY_CARE_PROVIDER_SITE_OTHER): Payer: Self-pay | Admitting: Physician Assistant

## 2017-04-08 ENCOUNTER — Other Ambulatory Visit (INDEPENDENT_AMBULATORY_CARE_PROVIDER_SITE_OTHER): Payer: Self-pay | Admitting: Physician Assistant

## 2017-04-08 ENCOUNTER — Telehealth: Payer: Self-pay | Admitting: Internal Medicine

## 2017-04-08 DIAGNOSIS — J849 Interstitial pulmonary disease, unspecified: Secondary | ICD-10-CM

## 2017-04-08 DIAGNOSIS — R7989 Other specified abnormal findings of blood chemistry: Secondary | ICD-10-CM

## 2017-04-08 DIAGNOSIS — G894 Chronic pain syndrome: Secondary | ICD-10-CM

## 2017-04-08 DIAGNOSIS — R768 Other specified abnormal immunological findings in serum: Secondary | ICD-10-CM

## 2017-04-08 NOTE — Telephone Encounter (Signed)
Results have been explained to patient, pt expressed understanding. Rheumatology appt has not been scheduled yet but referral was sent on 04/06/17 Patient sister given the number and information for the Rheumatologist.  Local Rheum denied the referral d/t insurance not being accepted.  Will send  To Dr [name] as Lorain Childes.  Referral Notes  Type Date User Summary Attachment  General 04/06/2017 2:04 PM Matias-Teodoro, Maryan Rued - -  Note   Per patient ok to send referral to Rheum in [place] Faxed referral to [facility]/[place]  [address], [place] Ph: [phone]  [phone]

## 2017-04-08 NOTE — Telephone Encounter (Signed)
Sorry, I can not prescribe anything stronger than Tramadol. He would have to be referred to a pain control clinic. I will put in the order now.

## 2017-04-08 NOTE — Progress Notes (Signed)
Pt requests stronger medication than Tramadol. Pain clinic referral placed.

## 2017-04-08 NOTE — Telephone Encounter (Signed)
     Robynn Pane  Let [name] know that CT shows ILD features that suggests he might have developed this from mold exposure . When is rheumatology appt? Depending on the outcome of that let him know he might need a biopsy of luing. I can address at followup. I can make the followup a bit quicker dpendong on his timing of rheum appt  Thanks Dr. Kalman Shan, M.D., [employer].C.P Pulmonary and Critical Care Medicine Staff Physician [facility] [place] Pulmonary and Critical Care Pager: [phone], If no answer or between  15:00h - 7:00h: call [phone]  04/08/2017 4:21 AM    Ct Chest High Resolution  Result Date: 04/05/2017 CLINICAL DATA:  61 year old male with history of shortness of breath and weakness. Evaluate for interstitial lung disease. EXAM: CT CHEST WITHOUT CONTRAST TECHNIQUE: Multidetector CT imaging of the chest was performed following the standard protocol without intravenous contrast. High resolution imaging of the lungs, as well as inspiratory and expiratory imaging, was performed. COMPARISON:  Chest CT 02/09/2017. FINDINGS: Cardiovascular: Heart size is mildly enlarged. There is no significant pericardial fluid, thickening or pericardial calcification. There is aortic atherosclerosis, as well as atherosclerosis of the great vessels of the mediastinum and the coronary arteries, including calcified atherosclerotic plaque in the left main, left anterior descending, left circumflex and right coronary arteries. Status post median sternotomy for CABG including LIMA to the LAD. Mediastinum/Nodes: Multiple borderline enlarged and mildly enlarged mediastinal and hilar lymph nodes are noted, measuring up to 12 mm in short axis in the subcarinal nodal station. Esophagus is unremarkable in appearance. No axillary lymphadenopathy. Lungs/Pleura: High-resolution images demonstrate patchy areas of ground-glass attenuation, septal thickening, thickening of the peribronchovascular  interstitium, mild cylindrical bronchiectasis, some peripheral bronchiolectasis, and a few areas of mild honeycombing. Notably, the honeycombing is mid to upper lung predominant. No lower lung predominant honeycombing is appreciated. No clear cut craniocaudal gradient otherwise identified. Inspiratory and expiratory imaging demonstrates some very mild air trapping, indicative of mild small airways disease. No acute consolidative airspace disease. No pleural effusions. No suspicious appearing pulmonary nodules or masses are noted. Upper Abdomen: Aortic atherosclerosis. Musculoskeletal: Median sternotomy wires. There are no aggressive appearing lytic or blastic lesions noted in the visualized portions of the skeleton. Multiple old healed and healing left-sided rib fractures are noted posteriorly and laterally. IMPRESSION: 1. The appearance of the lungs is compatible with interstitial lung disease. 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 Mild mediastinal and hilar lymphadenopathy is presumably chronic and reactive. Repeat high-resolution chest CT is suggested in 12 months to assess for temporal changes in the appearance of the lung parenchyma. 2. Aortic atherosclerosis, in addition to left main and 3 vessel coronary artery disease. Status post median sternotomy for CABG including LIMA to the LAD. 3. Mild cardiomegaly. Electronically Signed   By: Trudie Reed M.D.   On: 04/05/2017 12:43

## 2017-04-08 NOTE — Telephone Encounter (Signed)
lmtcb for pt.  

## 2017-04-08 NOTE — Telephone Encounter (Signed)
Per Olegario Messier patients sister Tramadol is not helping would like to know if  Sindy Messing PA can call in a different Rx.  Olegario Messier stated they are waiting on Rheumatology appt. Informed her that I did send to Rheumatology in [facility] because Rheumatology in [place] did not accept referral. [place] [facility] [place] will review first then they will contact us or patient to inform if able to accept patient.  Please follow up with patient.

## 2017-04-08 NOTE — Telephone Encounter (Signed)
FWD to PCP. Aanya Haynes S Cassiel Fernandez, CMA  

## 2017-04-08 NOTE — Telephone Encounter (Signed)
Left message for patients sister informing her that nothing stronger will be given and referral was placed to pain mgt. Maryjean Morn, CMA

## 2017-04-08 NOTE — Telephone Encounter (Signed)
Patient returned phone call..ert ° °

## 2017-04-09 ENCOUNTER — Encounter: Payer: Self-pay | Admitting: Physician Assistant

## 2017-04-09 ENCOUNTER — Ambulatory Visit (INDEPENDENT_AMBULATORY_CARE_PROVIDER_SITE_OTHER): Payer: Medicaid Other | Admitting: Physician Assistant

## 2017-04-09 ENCOUNTER — Telehealth: Payer: Self-pay | Admitting: Internal Medicine

## 2017-04-09 VITALS — BP 112/80 | HR 95 | Ht 73.0 in | Wt 176.1 lb

## 2017-04-09 DIAGNOSIS — I714 Abdominal aortic aneurysm, without rupture, unspecified: Secondary | ICD-10-CM

## 2017-04-09 DIAGNOSIS — I5042 Chronic combined systolic (congestive) and diastolic (congestive) heart failure: Secondary | ICD-10-CM

## 2017-04-09 DIAGNOSIS — I10 Essential (primary) hypertension: Secondary | ICD-10-CM

## 2017-04-09 DIAGNOSIS — I739 Peripheral vascular disease, unspecified: Secondary | ICD-10-CM | POA: Diagnosis not present

## 2017-04-09 DIAGNOSIS — I25810 Atherosclerosis of coronary artery bypass graft(s) without angina pectoris: Secondary | ICD-10-CM

## 2017-04-09 DIAGNOSIS — E7800 Pure hypercholesterolemia, unspecified: Secondary | ICD-10-CM

## 2017-04-09 NOTE — Telephone Encounter (Signed)
Spoke with pt's wife, Olegario Messier. She states that pt is having issues with his RA. Dr. Lily Kocher will only give him Tramadol and it's not controlling his pain. Olegario Messier wants to know if MR will give him something stronger. Advised her that MR is gone for the day. Olegario Messier states that she will try to reach out to Dr. Lily Kocher again. Nothing further was needed.

## 2017-04-09 NOTE — Patient Instructions (Addendum)
Medication Instructions:  Your physician recommends that you continue on your current medications as directed. Please refer to the Current Medication list given to you today.  Labs: Please have lab work today (Lipid Panel, CMET)  Follow-Up: Your physician recommends that you schedule a follow-up appointment in: 2 months with Dr. Delton See or with Ronie Spies PA on a day Dr. Delton See is in the office.    Any Other Special Instructions Will Be Listed Below (If Applicable).  You have been referred to: Vascular Surgery in [facility] for follow up.    If you need a refill on your cardiac medications before your next appointment, please call your pharmacy.
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
## 2017-04-10 LAB — COMPREHENSIVE METABOLIC PANEL
A/G RATIO: 1.1 — AB (ref 1.2–2.2)
ALBUMIN: 3.7 g/dL (ref 3.6–4.8)
ALT: 6 IU/L (ref 0–44)
AST: 7 IU/L (ref 0–40)
Alkaline Phosphatase: 121 IU/L — ABNORMAL HIGH (ref 39–117)
BILIRUBIN TOTAL: 0.3 mg/dL (ref 0.0–1.2)
BUN / CREAT RATIO: 10 (ref 10–24)
BUN: 9 mg/dL (ref 8–27)
CALCIUM: 9.1 mg/dL (ref 8.6–10.2)
CHLORIDE: 100 mmol/L (ref 96–106)
CO2: 21 mmol/L (ref 18–29)
Creatinine, Ser: 0.9 mg/dL (ref 0.76–1.27)
GFR, EST AFRICAN AMERICAN: 106 mL/min/{1.73_m2} (ref >59)
GFR, EST NON AFRICAN AMERICAN: 92 mL/min/{1.73_m2} (ref >59)
GLOBULIN, TOTAL: 3.4 g/dL (ref 1.5–4.5)
Glucose: 87 mg/dL (ref 65–99)
POTASSIUM: 4.1 mmol/L (ref 3.5–5.2)
Sodium: 138 mmol/L (ref 134–144)
TOTAL PROTEIN: 7.1 g/dL (ref 6.0–8.5)

## 2017-04-10 LAB — LIPID PANEL
CHOLESTEROL TOTAL: 115 mg/dL (ref 100–199)
Chol/HDL Ratio: 4.8 ratio (ref 0.0–5.0)
HDL: 24 mg/dL — AB (ref >39)
LDL Calculated: 58 mg/dL (ref 0–99)
TRIGLYCERIDES: 163 mg/dL — AB (ref 0–149)
VLDL Cholesterol Cal: 33 mg/dL (ref 5–40)

## 2017-04-12 NOTE — Telephone Encounter (Signed)
I called and spoke with pt's sister, Olegario Messier  She states that the pt still does not have appt with Rheum in [place]  She states that she called them today, and they told her to have Dr Mosie Epstein office give them a call and that would speed up the process.  She already called Dr Lily Kocher and let their office know  Will forward back to MR as [facility]

## 2017-04-12 NOTE — Telephone Encounter (Signed)
Elise  pls followup if rheumin [place] is going to see him. If so when. Ensure fu back with me  Thanks  Dr. Kalman Shan, M.D., [facility].C.P Pulmonary and Critical Care Medicine Staff Physician [facility]  Pulmonary and Critical Care Pager: [phone], If no answer or between  15:00h - 7:00h: call [phone]  04/12/2017 12:17 PM

## 2017-04-30 NOTE — Telephone Encounter (Signed)
Try with DR Erroll Luna at [facility] [place]. I thik [facility] takes medicaid  Dr. Kalman Shan, M.D., [facility].C.P Pulmonary and Critical Care Medicine Staff Physician [place] [facility] [place] Pulmonary and Critical Care Pager: [phone], If no answer or between  15:00h - 7:00h: call [phone]  04/30/2017 8:58 AM

## 2017-04-30 NOTE — Telephone Encounter (Signed)
lmtcb for pt to relay below info. Routing back to [place] for follow up as this message is closed and was initiated by MR.

## 2017-05-03 ENCOUNTER — Encounter: Payer: Self-pay | Admitting: Vascular Surgery

## 2017-05-06 NOTE — Telephone Encounter (Signed)
Called [facility] and spoke with the referral department. Gave them pt's dx that we are referring for. They stated once they receive referral they will determine how soon will the pt will need to be seen.  lmom tcb x2 to pt  No new referral has been placed yet

## 2017-05-06 NOTE — Addendum Note (Signed)
Addended by: Garfield Cornea L on: 05/06/2017 02:44 PM   Modules accepted: Orders

## 2017-05-06 NOTE — Addendum Note (Signed)
Addended by: Garfield Cornea L on: 05/06/2017 02:43 PM   Modules accepted: Orders

## 2017-05-07 ENCOUNTER — Telehealth: Payer: Self-pay | Admitting: Internal Medicine

## 2017-05-07 NOTE — Telephone Encounter (Signed)
Called and spoke with Olegario Messier and she stated that the pt already see's a rheumatologist at [facility] about 15 mins from their house and she stated that her car would not make it to [place]>  She is wanting to stay with the doctor that he is seeing already. Will forward to MR to make him aware.

## 2017-05-10 NOTE — Telephone Encounter (Addendum)
spoke with pt's sister, Olegario Messier and made her aware of MR's below message. I have spoken with Rayfield Citizen with [facility], and requested records to be faxed.  Will leave message in triage until fax is received.

## 2017-05-10 NOTE — Telephone Encounter (Signed)
MR please advise if anything further is needed.

## 2017-05-10 NOTE — Telephone Encounter (Signed)
That is it. THe should have the rheumatoligist send [place] his notes. Otherwise I wil see him 06/22/17 visit  THanks  Dr. Kalman Shan, M.D., [facility].C.P Pulmonary and Critical Care Medicine Staff Physician [place] [facility] [place] Pulmonary and Critical Care Pager: [phone], If no answer or between  15:00h - 7:00h: call [phone]  05/10/2017 1:00 PM

## 2017-05-11 NOTE — Telephone Encounter (Signed)
I looked in MR's cubby and folder up front, it does not appear that we have received records from [facility]. I have spoken with Rayfield Citizen, and requested that she refax records.

## 2017-05-12 ENCOUNTER — Encounter: Payer: Self-pay | Admitting: Vascular Surgery

## 2017-05-12 ENCOUNTER — Ambulatory Visit (INDEPENDENT_AMBULATORY_CARE_PROVIDER_SITE_OTHER): Payer: Medicaid Other | Admitting: Vascular Surgery

## 2017-05-12 VITALS — BP 127/88 | HR 97 | Temp 96.8°F | Resp 18 | Ht 72.0 in | Wt 190.0 lb

## 2017-05-12 DIAGNOSIS — I779 Disorder of arteries and arterioles, unspecified: Secondary | ICD-10-CM

## 2017-05-12 DIAGNOSIS — I714 Abdominal aortic aneurysm, without rupture, unspecified: Secondary | ICD-10-CM

## 2017-05-12 DIAGNOSIS — I739 Peripheral vascular disease, unspecified: Secondary | ICD-10-CM | POA: Diagnosis not present

## 2017-05-12 NOTE — Telephone Encounter (Signed)
OV notes have been received from [facility] and these are in MR cubby to look at.  Will forward to MR as an [place]

## 2017-05-12 NOTE — Telephone Encounter (Signed)
Please see phone note from 7.6.18. Will sign off.
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
## 2017-05-14 NOTE — Addendum Note (Signed)
Addended by: Burton Apley A on: 05/14/2017 02:48 PM   Modules accepted: Orders

## 2017-05-25 ENCOUNTER — Ambulatory Visit
Payer: Medicaid Other | Attending: Student in an Organized Health Care Education/Training Program | Admitting: Student in an Organized Health Care Education/Training Program

## 2017-05-25 ENCOUNTER — Encounter: Payer: Self-pay | Admitting: Student in an Organized Health Care Education/Training Program

## 2017-05-25 VITALS — BP 142/89 | HR 96 | Temp 97.6°F | Resp 16 | Ht 73.0 in | Wt 191.0 lb

## 2017-05-25 DIAGNOSIS — E7800 Pure hypercholesterolemia, unspecified: Secondary | ICD-10-CM | POA: Diagnosis not present

## 2017-05-25 DIAGNOSIS — G894 Chronic pain syndrome: Secondary | ICD-10-CM | POA: Insufficient documentation

## 2017-05-25 DIAGNOSIS — M25542 Pain in joints of left hand: Secondary | ICD-10-CM | POA: Diagnosis not present

## 2017-05-25 DIAGNOSIS — Z8679 Personal history of other diseases of the circulatory system: Secondary | ICD-10-CM | POA: Diagnosis not present

## 2017-05-25 DIAGNOSIS — M79642 Pain in left hand: Secondary | ICD-10-CM | POA: Diagnosis not present

## 2017-05-25 DIAGNOSIS — G629 Polyneuropathy, unspecified: Secondary | ICD-10-CM | POA: Insufficient documentation

## 2017-05-25 DIAGNOSIS — Z951 Presence of aortocoronary bypass graft: Secondary | ICD-10-CM | POA: Diagnosis not present

## 2017-05-25 DIAGNOSIS — D649 Anemia, unspecified: Secondary | ICD-10-CM | POA: Insufficient documentation

## 2017-05-25 DIAGNOSIS — M25561 Pain in right knee: Secondary | ICD-10-CM

## 2017-05-25 DIAGNOSIS — M79641 Pain in right hand: Secondary | ICD-10-CM | POA: Insufficient documentation

## 2017-05-25 DIAGNOSIS — M25562 Pain in left knee: Secondary | ICD-10-CM | POA: Diagnosis not present

## 2017-05-25 DIAGNOSIS — M2550 Pain in unspecified joint: Secondary | ICD-10-CM

## 2017-05-25 DIAGNOSIS — I252 Old myocardial infarction: Secondary | ICD-10-CM | POA: Diagnosis not present

## 2017-05-25 DIAGNOSIS — Z8673 Personal history of transient ischemic attack (TIA), and cerebral infarction without residual deficits: Secondary | ICD-10-CM | POA: Diagnosis not present

## 2017-05-25 DIAGNOSIS — Z9889 Other specified postprocedural states: Secondary | ICD-10-CM | POA: Diagnosis not present

## 2017-05-25 DIAGNOSIS — Z8739 Personal history of other diseases of the musculoskeletal system and connective tissue: Secondary | ICD-10-CM | POA: Diagnosis not present

## 2017-05-25 DIAGNOSIS — F17210 Nicotine dependence, cigarettes, uncomplicated: Secondary | ICD-10-CM | POA: Diagnosis not present

## 2017-05-25 DIAGNOSIS — M25541 Pain in joints of right hand: Secondary | ICD-10-CM | POA: Diagnosis not present

## 2017-05-25 DIAGNOSIS — K219 Gastro-esophageal reflux disease without esophagitis: Secondary | ICD-10-CM | POA: Diagnosis not present

## 2017-05-25 DIAGNOSIS — N189 Chronic kidney disease, unspecified: Secondary | ICD-10-CM | POA: Insufficient documentation

## 2017-05-25 DIAGNOSIS — Z7982 Long term (current) use of aspirin: Secondary | ICD-10-CM | POA: Insufficient documentation

## 2017-05-25 DIAGNOSIS — I2510 Atherosclerotic heart disease of native coronary artery without angina pectoris: Secondary | ICD-10-CM | POA: Diagnosis not present

## 2017-05-25 DIAGNOSIS — Z79899 Other long term (current) drug therapy: Secondary | ICD-10-CM | POA: Insufficient documentation

## 2017-05-25 DIAGNOSIS — M069 Rheumatoid arthritis, unspecified: Secondary | ICD-10-CM | POA: Insufficient documentation

## 2017-05-25 DIAGNOSIS — I129 Hypertensive chronic kidney disease with stage 1 through stage 4 chronic kidney disease, or unspecified chronic kidney disease: Secondary | ICD-10-CM | POA: Insufficient documentation

## 2017-05-25 MED ORDER — MELOXICAM 7.5 MG PO TABS: 7.5000 mg | ORAL_TABLET | Freq: Two times a day (BID) | ORAL | 0 refills | Status: DC

## 2017-05-25 MED ORDER — PREGABALIN 50 MG PO CAPS: ORAL_CAPSULE | ORAL | 2 refills | Status: DC

## 2017-05-25 NOTE — Patient Instructions (Signed)
It was very nice meeting you today. Today we discussed the following:  -We discussed starting a medication called Lyrica to help out with her numbness and tingling. Please start this medication at 50 mg daily for 2 weeks. After that increase to 50 mg twice daily.  -Discontinue her ibuprofen. I will prescribe you a medication called meloxicam which is also an anti-inflammatory medication. He will take this medication twice daily at a dose of 7.5 mg for 14 days. Please do not resume NSAIDs which includes Aleve or ibuprofen after you are done with the Mobic until you have seen me in clinic.  -We will obtain a urine drug screen today.  -We will provide you with a referral to med psych for risk assessment regarding chronic opioid therapy.  -Follow up in 3-4 weeks. At that time we can discuss further increasing Lyrica and also scheduling the knee injections which we will talk more about at the next visit. (Genciular nerve block vs IA Hyalgen)   ____________________________________________________________________________________________  Appointment Policy Summary  It is our goal and responsibility to provide the medical community with assistance in the evaluation and management of patients with chronic pain. Unfortunately our resources are limited. Because we do not have an unlimited amount of time, or available appointments, we are required to closely monitor and manage their use. The following rules exist to maximize their use:  Patient's responsibilities: 1. Punctuality:  At what time should I arrive? You should be physically present in our office 30 minutes before your scheduled appointment. Your scheduled appointment is with your assigned healthcare provider. However, it takes 5-10 minutes to be "checked-in", and another 15 minutes for the nurses to do the admission. If you arrive to our office at the time you were given for your appointment, you will end up being at least 20-25 minutes late to  your appointment with the provider. 2. Tardiness:  What happens if I arrive only a few minutes after my scheduled appointment time? You will need to reschedule your appointment. The cutoff is your appointment time. This is why it is so important that you arrive at least 30 minutes before that appointment. If you have an appointment scheduled for 10:00 AM and you arrive at 10:01, you will be required to reschedule your appointment.  3. Plan ahead:  Always assume that you will encounter traffic on your way in. Plan for it. If you are dependent on a driver, make sure they understand these rules and the need to arrive early. 4. Other appointments and responsibilities:  Avoid scheduling any other appointments before or after your pain clinic appointments.  5. Be prepared:  Write down everything that you need to discuss with your healthcare provider and give this information to the admitting nurse. Write down the medications that you will need refilled. Bring your pills and bottles (even the empty ones), to all of your appointments, except for those where a procedure is scheduled. 6. No children or pets:  Find someone to take care of them. It is not appropriate to bring them in. 7. Scheduling changes:  We request "advanced notification" of any changes or cancellations. 8. Advanced notification:  Defined as a time period of more than 24 hours prior to the originally scheduled appointment. This allows for the appointment to be offered to other patients. 9. Rescheduling:  When a visit is rescheduled, it will require the cancellation of the original appointment. For this reason they both fall within the category of "Cancellations".  10. Cancellations:  They require advanced notification. Any cancellation less than 24 hours before the  appointment will be recorded as a "No Show". 11. No Show:  Defined as an unkept appointment where the patient failed to notify or declare to the practice their intention or  inability to keep the appointment.  Corrective process for repeat offenders:  1. Tardiness: Three (3) episodes of rescheduling due to late arrivals will be recorded as one (1) "No Show". 2. Cancellation or reschedule: Three (3) cancellations or rescheduling will be recorded as one (1) "No Show". 3. "No Shows": Three (3) "No Shows" within a 12 month period will result in discharge from the practice.  ____________________________________________________________________________________________  ____________________________________________________________________________________________  Smokers present with more severe and extended chronic pain outcomes and have a higher frequency of prescription opioid use. Current tobacco smoking is a strong predictor of risk for nonmedical use of prescription opioids. Opioid and nicotinic-cholinergic neurotransmitter systems interact in important ways to modulate opioid and nicotine effects: dopamine release induced by nicotine is dependent on facilitation by the opioid system, and the nicotinic-acetylcholine system modulates self-administration of several classes of abused drugs-including opioids. Nicotine can serve as a prime for the use of other drugs, which in the case of the opioid system may be bidirectional. Opioids and compounds in tobacco, including nicotine, are metabolized by the cytochrome P450 enzyme system, but the metabolism of opioids and tobacco products can be complicated. Accordingly, drug interactions are possible but not always clear.  ____________________________________________________________________________________________

## 2017-05-25 NOTE — Progress Notes (Signed)
Safety precautions to be maintained throughout the outpatient stay will include: orient to surroundings, keep bed in low position, maintain call bell within reach at all times, provide assistance with transfer out of bed and ambulation.  

## 2017-05-25 NOTE — Progress Notes (Signed)
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 be a candidate for opioid analgesics. The decision will be made following CDC guidelines. This decision will be based on the results of diagnostic studies, as well as Mr. Royse's risk profile.  Patient was prescribed tramadol by PCP in the past which was not very effective. Can consider Nucynta or hydrocodone.   Membrane stabilizer: Patient is tried gabapentin  in the past and endorse that it was not effective. We'll trial Lyrica at initial visit. Has not tried Topamax.  Muscle relaxant: Has not tried. However does not complain of muscle spasms or muscle related pain. Could consider tizanidine or baclofen in the future.  NSAID: Usually takes ibuprofen 800 mg twice a day when necessary. Patient instructed to discontinue this medication at initial visit. I gave him a trial for Mobic 7.5 mg twice a day 14 days and told him to stay off of NSAID therapy after that until he comes to see me.  Other analgesic(s): Patient does have symptoms of depression and future therapeutic plan could include SNRI  like Cymbalta or the incorporation of a low-dose antidepressant.   Interventional  management options: Mr. Maiello was informed that there is no guarantee that he would be a candidate for interventional therapies. The decision will be based on the results of diagnostic studies, as well as Mr. Lemanski's risk profile.  Procedure(s) under consideration:  -Genicular nerve block -IA Hyalgen injection -shoulder joint injections -lumbar MBNBs    Provider-requested follow-up: Return in about 4 weeks (around 06/22/2017).  Future Appointments Date Time Provider [place]  05/27/2017 4:00 PM [facility] RM [facility] None  06/22/2017 11:30 AM Brand Males, MD [facility] None  06/24/2017 10:00 AM Charlie Pitter, PA-C [facility]  08/06/2017 9:00 AM [facility] HS VASC 1 [facility]  08/06/2017 10:00 AM MC-CV HS VASC 1 [facility] VVS  08/06/2017 11:00 AM Conrad [place], MD [facility]  08/06/2017 11:00 AM [facility] HS VASC 1 [facility] VVS    Primary Care Physician: [name], PA-C Location: [facility] Note by: Gillis Santa, M.D, Date: 05/25/2017; Time: 9:06 AM  Patient Instructions  It was very nice meeting you today. Today we discussed the following:  -We discussed starting a medication called Lyrica to help out with her numbness and tingling. Please start this medication at 50 mg daily for 2 weeks. After that increase to 50 mg twice daily.  -Discontinue her ibuprofen. I will prescribe you a medication called meloxicam which is also an anti-inflammatory medication. He will take this medication twice daily at a dose of 7.5 mg for 14 days. Please do not resume NSAIDs which includes Aleve or ibuprofen after you are done with the Mobic until you have seen me in clinic.  -We will obtain a urine drug screen today.  -We will provide you with a referral to med psych for risk assessment regarding chronic opioid therapy.  -Follow up in 3-4 weeks. At that time we can discuss further increasing Lyrica and also scheduling the knee injections which  we will talk more about at the next visit. (Genciular nerve block vs IA Hyalgen)   ____________________________________________________________________________________________  Appointment Policy Summary  It is our goal and responsibility to provide the medical community with assistance in the evaluation and management of patients with chronic pain. Unfortunately our resources are limited. Because we do not have an unlimited amount of time, or available appointments, we are required to closely monitor and manage their use. The following rules exist to maximize their use:  Patient's responsibilities: 1. Punctuality:  At what time should I arrive? You should be physically present in our office 30 minutes before your scheduled appointment. Your scheduled appointment is with your assigned healthcare provider. However, it takes 5-10 minutes to be "checked-in", and another 15 minutes for the nurses to do the admission. If you arrive to our office at the time you were  given for your appointment, you will end up being at least 20-25 minutes late to your appointment with the provider. 2. Tardiness:  What happens if I arrive only a few minutes after my scheduled appointment time? You will need to reschedule your appointment. The cutoff is your appointment time. This is why it is so important that you arrive at least 30 minutes before that appointment. If you have an appointment scheduled for 10:00 AM and you arrive at 10:01, you will be required to reschedule your appointment.  3. Plan ahead:  Always assume that you will encounter traffic on your way in. Plan for it. If you are dependent on a driver, make sure they understand these rules and the need to arrive early. 4. Other appointments and responsibilities:  Avoid scheduling any other appointments before or after your pain clinic appointments.  5. Be prepared:  Write down everything that you need to discuss with your healthcare provider and give this  information to the admitting nurse. Write down the medications that you will need refilled. Bring your pills and bottles (even the empty ones), to all of your appointments, except for those where a procedure is scheduled. 6. No children or pets:  Find someone to take care of them. It is not appropriate to bring them in. 7. Scheduling changes:  We request "advanced notification" of any changes or cancellations. 8. Advanced notification:  Defined as a time period of more than 24 hours prior to the originally scheduled appointment. This allows for the appointment to be offered to other patients. 9. Rescheduling:  When a visit is rescheduled, it will require the cancellation of the original appointment. For this reason they both fall within the category of "Cancellations".  10. Cancellations:  They require advanced notification. Any cancellation less than 24 hours before the  appointment will be recorded as a "No Show". 11. No Show:  Defined as an unkept appointment where the patient failed to notify or declare to the practice their intention or inability to keep the appointment.  Corrective process for repeat offenders:  1. Tardiness: Three (3) episodes of rescheduling due to late arrivals will be recorded as one (1) "No Show". 2. Cancellation or reschedule: Three (3) cancellations or rescheduling will be recorded as one (1) "No Show". 3. "No Shows": Three (3) "No Shows" within a 12 month period will result in discharge from the practice.  ____________________________________________________________________________________________  ____________________________________________________________________________________________  Smokers present with more severe and extended chronic pain outcomes and have a higher frequency of prescription opioid use. Current tobacco smoking is a strong predictor of risk for nonmedical use of prescription opioids. Opioid and nicotinic-cholinergic neurotransmitter  systems interact in important ways to modulate opioid and nicotine effects: dopamine release induced by nicotine is dependent on facilitation by the opioid system, and the nicotinic-acetylcholine system modulates self-administration of several classes of abused drugs-including opioids. Nicotine can serve as a prime for the use of other drugs, which in the case of the opioid system may be bidirectional. Opioids and compounds in tobacco, including nicotine, are metabolized by the cytochrome P450 enzyme system, but the metabolism of opioids and tobacco products can be complicated. Accordingly, drug interactions are possible but not always clear.  ____________________________________________________________________________________________

## 2017-05-26 ENCOUNTER — Other Ambulatory Visit: Payer: Self-pay | Admitting: Student in an Organized Health Care Education/Training Program

## 2017-05-26 DIAGNOSIS — M25542 Pain in joints of left hand: Principal | ICD-10-CM

## 2017-05-26 DIAGNOSIS — M25541 Pain in joints of right hand: Secondary | ICD-10-CM

## 2017-05-26 MED ORDER — MELOXICAM 7.5 MG PO TABS: 7.5000 mg | ORAL_TABLET | Freq: Two times a day (BID) | ORAL | 0 refills | Status: DC

## 2017-05-26 MED ORDER — PREGABALIN 50 MG PO CAPS: ORAL_CAPSULE | ORAL | 2 refills | Status: DC

## 2017-05-26 NOTE — Addendum Note (Signed)
Addended by: Edward Jolly on: 05/26/2017 12:51 PM   Modules accepted: Orders

## 2017-05-27 ENCOUNTER — Ambulatory Visit (INDEPENDENT_AMBULATORY_CARE_PROVIDER_SITE_OTHER): Payer: Medicaid Other | Admitting: Internal Medicine

## 2017-05-27 DIAGNOSIS — J849 Interstitial pulmonary disease, unspecified: Secondary | ICD-10-CM

## 2017-05-27 LAB — PULMONARY FUNCTION TEST
DL/VA % PRED: 70 %
DL/VA: 3.39 ml/min/mmHg/L
DLCO COR % PRED: 39 %
DLCO UNC: 13.66 ml/min/mmHg
DLCO cor: 14.5 ml/min/mmHg
DLCO unc % pred: 37 %
FEF 25-75 Pre: 2.63 L/sec
FEF2575-%Pred-Pre: 82 %
FEV1-%PRED-PRE: 61 %
FEV1-PRE: 2.42 L
FEV1FVC-%Pred-Pre: 113 %
FEV6-%Pred-Pre: 57 %
FEV6-Pre: 2.85 L
FEV6FVC-%PRED-PRE: 104 %
FVC-%PRED-PRE: 54 %
FVC-PRE: 2.85 L
PRE FEV1/FVC RATIO: 85 %
Pre FEV6/FVC Ratio: 100 %

## 2017-05-27 NOTE — Progress Notes (Signed)
PFT done today. 

## 2017-05-30 LAB — COMPLIANCE DRUG ANALYSIS, UR

## 2017-06-01 ENCOUNTER — Telehealth: Payer: Self-pay

## 2017-06-01 NOTE — Telephone Encounter (Signed)
PA sent for Lyrica via [place] [employer].

## 2017-06-01 NOTE — Telephone Encounter (Signed)
Pt needs a prior auth to be completed for lyrica please call pt sister

## 2017-06-07 ENCOUNTER — Encounter: Payer: Self-pay | Admitting: Physician Assistant

## 2017-06-08 ENCOUNTER — Telehealth: Payer: Self-pay | Admitting: Internal Medicine

## 2017-06-08 NOTE — Telephone Encounter (Signed)
He has seen [facility]. The notes are in epic as well. I reviewed them. 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 Will discuss cellcept etc. V imuran with him  Dr. Kalman Shan, M.D., [facility].C.P Pulmonary and Critical Care Medicine Staff Physician [facility] [place] [facility] Pager: [phone], If no answer or between  15:00h - 7:00h: call [phone]  06/08/2017 3:03 PM

## 2017-06-08 NOTE — Telephone Encounter (Signed)
Called and spoke to pt's sister, [name] Informed her of the recs per [name] verbalized understanding and denied any further questions or concerns at this time.

## 2017-06-08 NOTE — Telephone Encounter (Signed)
Please see phone note from 7.6.2018. Will sign off.

## 2017-06-08 NOTE — Telephone Encounter (Signed)
LM x 1 Will hold in triage as MR closed this message

## 2017-06-18 ENCOUNTER — Other Ambulatory Visit: Payer: Self-pay | Admitting: Physician Assistant

## 2017-06-18 NOTE — Telephone Encounter (Signed)
Medication Detail    Disp Refills Start End   spironolactone (ALDACTONE) 25 MG tablet 30 tablet 11 03/15/2017    Sig - Route: Take 1 tablet (25 mg total) by mouth daily. - Oral   Sent to pharmacy as: spironolactone (ALDACTONE) 25 MG tablet   E-Prescribing Status: Receipt confirmed by pharmacy (03/15/2017 12:24 PM EDT)   Pharmacy   [id number] - [place],  - [address]

## 2017-06-22 ENCOUNTER — Ambulatory Visit (INDEPENDENT_AMBULATORY_CARE_PROVIDER_SITE_OTHER): Payer: Medicaid Other | Admitting: Internal Medicine

## 2017-06-22 ENCOUNTER — Telehealth: Payer: Self-pay | Admitting: Internal Medicine

## 2017-06-22 ENCOUNTER — Encounter: Payer: Self-pay | Admitting: Internal Medicine

## 2017-06-22 VITALS — BP 124/86 | HR 97 | Ht 73.0 in | Wt 195.0 lb

## 2017-06-22 DIAGNOSIS — J849 Interstitial pulmonary disease, unspecified: Secondary | ICD-10-CM | POA: Diagnosis not present

## 2017-06-22 DIAGNOSIS — R911 Solitary pulmonary nodule: Secondary | ICD-10-CM | POA: Diagnosis not present

## 2017-06-22 DIAGNOSIS — R7989 Other specified abnormal findings of blood chemistry: Secondary | ICD-10-CM

## 2017-06-22 DIAGNOSIS — R768 Other specified abnormal immunological findings in serum: Secondary | ICD-10-CM

## 2017-06-22 DIAGNOSIS — F17200 Nicotine dependence, unspecified, uncomplicated: Secondary | ICD-10-CM | POA: Diagnosis not present
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
## 2017-06-22 NOTE — Telephone Encounter (Signed)
Please have her rheuymatologist Dr Tresa Folk at [facility] in [place] call me to discuss [name] - immuran or cellcept. On my cell or I can call her on her cell  Dr. Kalman Shan, M.D., [facility].C.P Pulmonary and Critical Care Medicine Staff Physician [facility] Pager: [phone], If no answer or between  15:00h - 7:00h: call [phone]  06/22/2017 12:08 PM

## 2017-06-22 NOTE — Patient Instructions (Addendum)
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 If not, I wil discuss another drug called cellcept  - idea is to prevent progression but cannot be guaranteed - check ONO at home  Solitary pulmonary nodule 51mm RUL nodule April 2018  Plan  - HRCT in oct/nov 2018   Current smoker Try to quit ASAP  Followup In 3 months do Pre-bd spiro and dlco only. No lung volume or bd response. 3 months or sooner if needed Might consider FeNO test at followup if cough is bad

## 2017-06-24 ENCOUNTER — Ambulatory Visit (INDEPENDENT_AMBULATORY_CARE_PROVIDER_SITE_OTHER): Payer: Medicaid Other | Admitting: Physician Assistant

## 2017-06-24 ENCOUNTER — Encounter: Payer: Self-pay | Admitting: Student in an Organized Health Care Education/Training Program

## 2017-06-24 ENCOUNTER — Ambulatory Visit
Payer: Medicaid Other | Attending: Student in an Organized Health Care Education/Training Program | Admitting: Student in an Organized Health Care Education/Training Program

## 2017-06-24 ENCOUNTER — Encounter: Payer: Self-pay | Admitting: Physician Assistant

## 2017-06-24 VITALS — BP 152/87 | HR 92 | Temp 97.6°F | Resp 16 | Ht 73.0 in | Wt 196.0 lb

## 2017-06-24 VITALS — BP 147/90 | HR 89 | Ht 73.0 in | Wt 198.8 lb

## 2017-06-24 DIAGNOSIS — I2510 Atherosclerotic heart disease of native coronary artery without angina pectoris: Secondary | ICD-10-CM | POA: Insufficient documentation

## 2017-06-24 DIAGNOSIS — J849 Interstitial pulmonary disease, unspecified: Secondary | ICD-10-CM | POA: Insufficient documentation

## 2017-06-24 DIAGNOSIS — I69354 Hemiplegia and hemiparesis following cerebral infarction affecting left non-dominant side: Secondary | ICD-10-CM | POA: Diagnosis not present

## 2017-06-24 DIAGNOSIS — M25541 Pain in joints of right hand: Secondary | ICD-10-CM | POA: Insufficient documentation

## 2017-06-24 DIAGNOSIS — M25562 Pain in left knee: Secondary | ICD-10-CM | POA: Diagnosis not present

## 2017-06-24 DIAGNOSIS — I110 Hypertensive heart disease with heart failure: Secondary | ICD-10-CM | POA: Diagnosis not present

## 2017-06-24 DIAGNOSIS — G629 Polyneuropathy, unspecified: Secondary | ICD-10-CM

## 2017-06-24 DIAGNOSIS — D649 Anemia, unspecified: Secondary | ICD-10-CM | POA: Diagnosis not present

## 2017-06-24 DIAGNOSIS — I739 Peripheral vascular disease, unspecified: Secondary | ICD-10-CM | POA: Diagnosis not present

## 2017-06-24 DIAGNOSIS — M2550 Pain in unspecified joint: Secondary | ICD-10-CM

## 2017-06-24 DIAGNOSIS — R911 Solitary pulmonary nodule: Secondary | ICD-10-CM | POA: Insufficient documentation

## 2017-06-24 DIAGNOSIS — Z8249 Family history of ischemic heart disease and other diseases of the circulatory system: Secondary | ICD-10-CM | POA: Insufficient documentation

## 2017-06-24 DIAGNOSIS — Z823 Family history of stroke: Secondary | ICD-10-CM | POA: Insufficient documentation

## 2017-06-24 DIAGNOSIS — I10 Essential (primary) hypertension: Secondary | ICD-10-CM

## 2017-06-24 DIAGNOSIS — I252 Old myocardial infarction: Secondary | ICD-10-CM | POA: Diagnosis not present

## 2017-06-24 DIAGNOSIS — Z79899 Other long term (current) drug therapy: Secondary | ICD-10-CM | POA: Insufficient documentation

## 2017-06-24 DIAGNOSIS — I779 Disorder of arteries and arterioles, unspecified: Secondary | ICD-10-CM | POA: Insufficient documentation

## 2017-06-24 DIAGNOSIS — I5042 Chronic combined systolic (congestive) and diastolic (congestive) heart failure: Secondary | ICD-10-CM

## 2017-06-24 DIAGNOSIS — Z7982 Long term (current) use of aspirin: Secondary | ICD-10-CM | POA: Insufficient documentation

## 2017-06-24 DIAGNOSIS — M170 Bilateral primary osteoarthritis of knee: Secondary | ICD-10-CM

## 2017-06-24 DIAGNOSIS — M25561 Pain in right knee: Secondary | ICD-10-CM | POA: Insufficient documentation

## 2017-06-24 DIAGNOSIS — E785 Hyperlipidemia, unspecified: Secondary | ICD-10-CM

## 2017-06-24 DIAGNOSIS — I714 Abdominal aortic aneurysm, without rupture: Secondary | ICD-10-CM | POA: Diagnosis not present

## 2017-06-24 DIAGNOSIS — Z8739 Personal history of other diseases of the musculoskeletal system and connective tissue: Secondary | ICD-10-CM | POA: Diagnosis not present

## 2017-06-24 DIAGNOSIS — F17210 Nicotine dependence, cigarettes, uncomplicated: Secondary | ICD-10-CM | POA: Insufficient documentation

## 2017-06-24 DIAGNOSIS — M79642 Pain in left hand: Secondary | ICD-10-CM | POA: Diagnosis present

## 2017-06-24 DIAGNOSIS — G894 Chronic pain syndrome: Secondary | ICD-10-CM | POA: Insufficient documentation

## 2017-06-24 DIAGNOSIS — M069 Rheumatoid arthritis, unspecified: Secondary | ICD-10-CM | POA: Diagnosis not present

## 2017-06-24 DIAGNOSIS — Z951 Presence of aortocoronary bypass graft: Secondary | ICD-10-CM | POA: Diagnosis not present

## 2017-06-24 DIAGNOSIS — M25542 Pain in joints of left hand: Secondary | ICD-10-CM | POA: Insufficient documentation

## 2017-06-24 MED ORDER — GABAPENTIN 300 MG PO CAPS: 300.0000 mg | ORAL_CAPSULE | Freq: Three times a day (TID) | ORAL | 2 refills | Status: DC

## 2017-06-24 MED ORDER — VARENICLINE TARTRATE 0.5 MG X 11 & 1 MG X 42 PO MISC: ORAL | 0 refills | Status: DC

## 2017-06-24 MED ORDER — HYDROCODONE-ACETAMINOPHEN 5-325 MG PO TABS: 1.0000 | ORAL_TABLET | Freq: Two times a day (BID) | ORAL | 0 refills | Status: DC | PRN

## 2017-06-24 MED ORDER — LISINOPRIL 20 MG PO TABS: 20.0000 mg | ORAL_TABLET | Freq: Every day | ORAL | 3 refills | Status: DC

## 2017-06-24 MED ORDER — VARENICLINE TARTRATE 1 MG PO TABS: 1.0000 mg | ORAL_TABLET | Freq: Two times a day (BID) | ORAL | 1 refills | Status: DC

## 2017-06-24 NOTE — Patient Instructions (Addendum)
Medication Instructions:  Your physician has recommended you make the following change in your medication:  1.  INCREASE the Lisinopril to 20 mg daily 2.  START the Chantix starter kit 3.  START Chantix after you finish the starter kit   Labwork: 1 WEEK:  BMET  Testing/Procedures: None ordered  Follow-Up: Your physician wants you to follow-up in: 6 MONTHS WITH DR. NELSON  You will receive a reminder letter in the mail two months in advance. If you don't receive a letter, please call our office to schedule the follow-up appointment.     Any Other Special Instructions Will Be Listed Below (If Applicable). Check your blood pressure 1-2 times a day at different times of the day, and drop of your readings on the day you come back for lab work.   If you need a refill on your cardiac medications before your next appointment, please call your pharmacy.   

## 2017-06-24 NOTE — Progress Notes (Signed)
Safety precautions to be maintained throughout the outpatient stay will include: orient to surroundings, keep bed in low position, maintain call bell within reach at all times, provide assistance with transfer out of bed and ambulation.  

## 2017-06-24 NOTE — Progress Notes (Signed)
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 place this medication, or any other prescription from our practice, on "Automatic Refill". Patient may have prescription filled one day early if pharmacy is closed on scheduled refill date. To last until: 9/23   Lab-work, procedure(s), and/or referral(s): Orders Placed This Encounter  Procedures  . GENICULAR NERVE BLOCK    Pharmacological management options:  Opioid Analgesics: We'll take over management today. See above orders can also consider Nucynta Membrane stabilizer: We'll retrial gabapentin. Lyrica not approved. Patient has not tried Topamax. Muscle relaxant: Has not tried. However does not complain of muscle spasms or muscle related pain. Could consider tizanidine or baclofen in the future.  NSAID: Usually takes ibuprofen 800 mg twice a day when necessary. Patient instructed to discontinue this medication at initial visit. I gave him a trial for Mobic 7.5 mg twice a day 14 days and told him to stay off of NSAID therapy after that until he comes to see me.  Other analgesic(s): Patient does have symptoms of depression and future therapeutic plan could  include SNRI  like Cymbalta or the incorporation of a low-dose antidepressant.      Interventional management options: Planned, scheduled, and/or pending:    Bilateral genicular nerve block with goal RFA    Considering:   -IA Hyalgen injection -shoulder joint injections -lumbar MBNBs -SI joint injection    PRN Procedures:   To be determined at a later time   Provider-requested follow-up: Return in about 3 weeks (around 07/15/2017) for Medication Management.  Future Appointments Date Time Provider [place]  07/01/2017 10:45 AM [facility]  08/06/2017 9:00 AM [facility] 1 [facility]  08/06/2017 10:00 AM [facility]  08/06/2017 11:00 AM Conrad Parkline, MD [facility]  08/06/2017 11:00 AM [facility]  08/17/2017 1:00 PM [facility]  09/27/2017 11:00 AM [facility] PFT ROOM [facility] None  09/27/2017 11:30 AM Brand Males, MD [place] None    Primary Care Physician: [name], PA-C Location: [facility] Note by: Gillis Santa, M.D Date: 06/24/2017; Time: 2:13 PM  Patient Instructions   Today we did the following:  #1. We will have you sign our opioid agreement. #2. Prescription for hydrocodone 5 mg twice a day when necessary, quantity 60. One-month prescription. #3. Prescription for gabapentin 300 mg 3 times a day. #4. Schedule for bilateral genicular nerve blockGENERAL RISKS AND COMPLICATIONS  What are the risk, side effects and possible complications? Generally speaking, most procedures are safe.  However, with any procedure there are risks, side effects, and the possibility of complications.  The risks and complications are dependent upon the sites that are lesioned, or the type of nerve block to be performed.  The closer the procedure is to the spine, the more serious the risks are.  Great care is taken when placing the radio frequency  needles, block needles or lesioning probes, but sometimes complications can occur. 1. Infection: Any time there is an injection through the skin, there is a risk of infection.  This is why sterile conditions are used for these blocks.  There are four possible  types of infection. 1. Localized skin infection. 2. Central Nervous System Infection-This can be in the form of Meningitis, which can be deadly. 3. Epidural Infections-This can be in the form of an epidural abscess, which can cause pressure inside of the spine, causing compression of the spinal cord with subsequent paralysis. This would require an emergency surgery to decompress, and there are no guarantees that the patient would recover from the paralysis. 4. Discitis-This is an infection of the intervertebral discs.  It occurs in about 1% of discography procedures.  It is difficult to treat and it may lead to surgery.        2. Pain: the needles have to go through skin and soft tissues, will cause soreness.       3. Damage to internal structures:  The nerves to be lesioned may be near blood vessels or    other nerves which can be potentially damaged.       4. Bleeding: Bleeding is more common if the patient is taking blood thinners such as  aspirin, Coumadin, Ticiid, Plavix, etc., or if he/she have some genetic predisposition  such as hemophilia. Bleeding into the spinal canal can cause compression of the spinal  cord with subsequent paralysis.  This would require an emergency surgery to  decompress and there are no guarantees that the patient would recover from the  paralysis.       5. Pneumothorax:  Puncturing of a lung is a possibility, every time a needle is introduced in  the area of the chest or upper back.  Pneumothorax refers to free air around the  collapsed lung(s), inside of the thoracic cavity (chest cavity).  Another two possible  complications related to a similar event would include: Hemothorax and Chylothorax.   These are variations  of the Pneumothorax, where instead of air around the collapsed  lung(s), you may have blood or chyle, respectively.       6. Spinal headaches: They may occur with any procedures in the area of the spine.       7. Persistent CSF (Cerebro-Spinal Fluid) leakage: This is a rare problem, but may occur  with prolonged intrathecal or epidural catheters either due to the formation of a fistulous  track or a dural tear.       8. Nerve damage: By working so close to the spinal cord, there is always a possibility of  nerve damage, which could be as serious as a permanent spinal cord injury with  paralysis.       9. Death:  Although rare, severe deadly allergic reactions known as "Anaphylactic  reaction" can occur to any of the medications used.      10. Worsening of the symptoms:  We can always make thing worse.  What are the chances of something like this happening? Chances of any of this occuring are extremely low.  By statistics, you have more of a chance of getting killed in a motor vehicle accident: while driving to the hospital than any of the above occurring .  Nevertheless, you should be aware that they are possibilities.  In general, it is similar to taking a shower.  Everybody knows that you can slip, hit your head and get killed.  Does that mean that you should not shower again?  Nevertheless always keep in mind that statistics do not mean anything if you happen to be on the wrong side of them.  Even if a procedure has a 1 (one) in a 1,000,000 (  million) chance of going wrong, it you happen to be that one..Also, keep in mind that by statistics, you have more of a chance of having something go wrong when taking medications.  Who should not have this procedure? If you are on a blood thinning medication (e.g. Coumadin, Plavix, see list of "Blood Thinners"), or if you have an active infection going on, you should not have the procedure.  If you are taking any blood thinners, please inform your  physician.  How should I prepare for this procedure?  Do not eat or drink anything at least six hours prior to the procedure.  Bring a driver with you .  It cannot be a taxi.  Come accompanied by an adult that can drive you back, and that is strong enough to help you if your legs get weak or numb from the local anesthetic.  Take all of your medicines the morning of the procedure with just enough water to swallow them.  If you have diabetes, make sure that you are scheduled to have your procedure done first thing in the morning, whenever possible.  If you have diabetes, take only half of your insulin dose and notify our nurse that you have done so as soon as you arrive at the clinic.  If you are diabetic, but only take blood sugar pills (oral hypoglycemic), then do not take them on the morning of your procedure.  You may take them after you have had the procedure.  Do not take aspirin or any aspirin-containing medications, at least eleven (11) days prior to the procedure.  They may prolong bleeding.  Wear loose fitting clothing that may be easy to take off and that you would not mind if it got stained with Betadine or blood.  Do not wear any jewelry or perfume  Remove any nail coloring.  It will interfere with some of our monitoring equipment.  NOTE: Remember that this is not meant to be interpreted as a complete list of all possible complications.  Unforeseen problems may occur.  BLOOD THINNERS The following drugs contain aspirin or other products, which can cause increased bleeding during surgery and should not be taken for 2 weeks prior to and 1 week after surgery.  If you should need take something for relief of minor pain, you may take acetaminophen which is found in Tylenol,m Datril, Anacin-3 and Panadol. It is not blood thinner. The products listed below are.  Do not take any of the products listed below in addition to any listed on your instruction sheet.  A.P.C or A.P.C with  Codeine Codeine Phosphate Capsules #3 Ibuprofen Ridaura  ABC compound Congesprin Imuran rimadil  Advil Cope Indocin Robaxisal  Alka-Seltzer Effervescent Pain Reliever and Antacid Coricidin or Coricidin-D  Indomethacin Rufen  Alka-Seltzer plus Cold Medicine Cosprin Ketoprofen S-A-C Tablets  Anacin Analgesic Tablets or Capsules Coumadin Korlgesic Salflex  Anacin Extra Strength Analgesic tablets or capsules CP-2 Tablets Lanoril Salicylate  Anaprox Cuprimine Capsules Levenox Salocol  Anexsia-D Dalteparin Magan Salsalate  Anodynos Darvon compound Magnesium Salicylate Sine-off  Ansaid Dasin Capsules Magsal Sodium Salicylate  Anturane Depen Capsules Marnal Soma  APF Arthritis pain formula Dewitt's Pills Measurin Stanback  Argesic Dia-Gesic Meclofenamic Sulfinpyrazone  Arthritis Bayer Timed Release Aspirin Diclofenac Meclomen Sulindac  Arthritis pain formula Anacin Dicumarol Medipren Supac  Analgesic (Safety coated) Arthralgen Diffunasal Mefanamic Suprofen  Arthritis Strength Bufferin Dihydrocodeine Mepro Compound Suprol  Arthropan liquid Dopirydamole Methcarbomol with Aspirin Synalgos  ASA tablets/Enseals Disalcid Micrainin Tagament  Ascriptin Doan's Midol  Talwin  Ascriptin A/D Dolene Mobidin Tanderil  Ascriptin Extra Strength Dolobid Moblgesic Ticlid  Ascriptin with Codeine Doloprin or Doloprin with Codeine Momentum Tolectin  Asperbuf Duoprin Mono-gesic Trendar  Aspergum Duradyne Motrin or Motrin IB Triminicin  Aspirin plain, buffered or enteric coated Durasal Myochrisine Trigesic  Aspirin Suppositories Easprin Nalfon Trillsate  Aspirin with Codeine Ecotrin Regular or Extra Strength Naprosyn Uracel  Atromid-S Efficin Naproxen Ursinus  Auranofin Capsules Elmiron Neocylate Vanquish  Axotal Emagrin Norgesic Verin  Azathioprine Empirin or Empirin with Codeine Normiflo Vitamin E  Azolid Emprazil Nuprin Voltaren  Bayer Aspirin plain, buffered or children's or timed BC Tablets or powders  Encaprin Orgaran Warfarin Sodium  Buff-a-Comp Enoxaparin Orudis Zorpin  Buff-a-Comp with Codeine Equegesic Os-Cal-Gesic   Buffaprin Excedrin plain, buffered or Extra Strength Oxalid   Bufferin Arthritis Strength Feldene Oxphenbutazone   Bufferin plain or Extra Strength Feldene Capsules Oxycodone with Aspirin   Bufferin with Codeine Fenoprofen Fenoprofen Pabalate or Pabalate-SF   Buffets II Flogesic Panagesic   Buffinol plain or Extra Strength Florinal or Florinal with Codeine Panwarfarin   Buf-Tabs Flurbiprofen Penicillamine   Butalbital Compound Four-way cold tablets Penicillin   Butazolidin Fragmin Pepto-Bismol   Carbenicillin Geminisyn Percodan   Carna Arthritis Reliever Geopen Persantine   Carprofen Gold's salt Persistin   Chloramphenicol Goody's Phenylbutazone   Chloromycetin Haltrain Piroxlcam   Clmetidine heparin Plaquenil   Cllnoril Hyco-pap Ponstel   Clofibrate Hydroxy chloroquine Propoxyphen         Before stopping any of these medications, be sure to consult the physician who ordered them.  Some, such as Coumadin (Warfarin) are ordered to prevent or treat serious conditions such as "deep thrombosis", "pumonary embolisms", and other heart problems.  The amount of time that you may need off of the medication may also vary with the medication and the reason for which you were taking it.  If you are taking any of these medications, please make sure you notify your pain physician before you undergo any procedures.         Pain Management Discharge Instructions  General Discharge Instructions :  If you need to reach your doctor call: Monday-Friday 8:00 am - 4:00 pm at [phone] or toll free [phone].  After clinic hours [phone] to have operator reach doctor.  Bring all of your medication bottles to all your appointments in the pain clinic.  To cancel or reschedule your appointment with Pain Management please remember to call 24 hours in advance to avoid a  fee.  Refer to the educational materials which you have been given on: General Risks, I had my Procedure. Discharge Instructions, Post Sedation.  Post Procedure Instructions:  The drugs you were given will stay in your system until tomorrow, so for the next 24 hours you should not drive, make any legal decisions or drink any alcoholic beverages.  You may eat anything you prefer, but it is better to start with liquids then soups and crackers, and gradually work up to solid foods.  Please notify your doctor immediately if you have any unusual bleeding, trouble breathing or pain that is not related to your normal pain.  Depending on the type of procedure that was done, some parts of your body may feel week and/or numb.  This usually clears up by tonight or the next day.  Walk with the use of an assistive device or accompanied by an adult for the 24 hours.  You may use ice on the affected area for the first 24 hours.  Put ice in a Ziploc bag and cover with a towel and place against area 15 minutes on 15 minutes off.  You may switch to heat after 24 hours.

## 2017-06-24 NOTE — Telephone Encounter (Signed)
Called Dr Ardith Dark office at [phone] and LM with his nurse Tobi Bastos to have Dr Renard Matter call MR on his cell

## 2017-06-24 NOTE — Patient Instructions (Addendum)
Today we did the following:  #1. We will have you sign our opioid agreement. #2. Prescription for hydrocodone 5 mg twice a day when necessary, quantity 60. One-month prescription. #3. Prescription for gabapentin 300 mg 3 times a day. #4. Schedule for bilateral genicular nerve blockGENERAL RISKS AND COMPLICATIONS  What are the risk, side effects and possible complications? Generally speaking, most procedures are safe.  However, with any procedure there are risks, side effects, and the possibility of complications.  The risks and complications are dependent upon the sites that are lesioned, or the type of nerve block to be performed.  The closer the procedure is to the spine, the more serious the risks are.  Great care is taken when placing the radio frequency needles, block needles or lesioning probes, but sometimes complications can occur. 1. Infection: Any time there is an injection through the skin, there is a risk of infection.  This is why sterile conditions are used for these blocks.  There are four possible types of infection. 1. Localized skin infection. 2. Central Nervous System Infection-This can be in the form of Meningitis, which can be deadly. 3. Epidural Infections-This can be in the form of an epidural abscess, which can cause pressure inside of the spine, causing compression of the spinal cord with subsequent paralysis. This would require an emergency surgery to decompress, and there are no guarantees that the patient would recover from the paralysis. 4. Discitis-This is an infection of the intervertebral discs.  It occurs in about 1% of discography procedures.  It is difficult to treat and it may lead to surgery.        2. Pain: the needles have to go through skin and soft tissues, will cause soreness.       3. Damage to internal structures:  The nerves to be lesioned may be near blood vessels or    other nerves which can be potentially damaged.       4. Bleeding: Bleeding is more  common if the patient is taking blood thinners such as  aspirin, Coumadin, Ticiid, Plavix, etc., or if he/she have some genetic predisposition  such as hemophilia. Bleeding into the spinal canal can cause compression of the spinal  cord with subsequent paralysis.  This would require an emergency surgery to  decompress and there are no guarantees that the patient would recover from the  paralysis.       5. Pneumothorax:  Puncturing of a lung is a possibility, every time a needle is introduced in  the area of the chest or upper back.  Pneumothorax refers to free air around the  collapsed lung(s), inside of the thoracic cavity (chest cavity).  Another two possible  complications related to a similar event would include: Hemothorax and Chylothorax.   These are variations of the Pneumothorax, where instead of air around the collapsed  lung(s), you may have blood or chyle, respectively.       6. Spinal headaches: They may occur with any procedures in the area of the spine.       7. Persistent CSF (Cerebro-Spinal Fluid) leakage: This is a rare problem, but may occur  with prolonged intrathecal or epidural catheters either due to the formation of a fistulous  track or a dural tear.       8. Nerve damage: By working so close to the spinal cord, there is always a possibility of  nerve damage, which could be as serious as a permanent spinal cord injury with  paralysis.       9. Death:  Although rare, severe deadly allergic reactions known as "Anaphylactic  reaction" can occur to any of the medications used.      10. Worsening of the symptoms:  We can always make thing worse.  What are the chances of something like this happening? Chances of any of this occuring are extremely low.  By statistics, you have more of a chance of getting killed in a motor vehicle accident: while driving to the hospital than any of the above occurring .  Nevertheless, you should be aware that they are possibilities.  In general, it is  similar to taking a shower.  Everybody knows that you can slip, hit your head and get killed.  Does that mean that you should not shower again?  Nevertheless always keep in mind that statistics do not mean anything if you happen to be on the wrong side of them.  Even if a procedure has a 1 (one) in a 1,000,000 (million) chance of going wrong, it you happen to be that one..Also, keep in mind that by statistics, you have more of a chance of having something go wrong when taking medications.  Who should not have this procedure? If you are on a blood thinning medication (e.g. Coumadin, Plavix, see list of "Blood Thinners"), or if you have an active infection going on, you should not have the procedure.  If you are taking any blood thinners, please inform your physician.  How should I prepare for this procedure?  Do not eat or drink anything at least six hours prior to the procedure.  Bring a driver with you .  It cannot be a taxi.  Come accompanied by an adult that can drive you back, and that is strong enough to help you if your legs get weak or numb from the local anesthetic.  Take all of your medicines the morning of the procedure with just enough water to swallow them.  If you have diabetes, make sure that you are scheduled to have your procedure done first thing in the morning, whenever possible.  If you have diabetes, take only half of your insulin dose and notify our nurse that you have done so as soon as you arrive at the clinic.  If you are diabetic, but only take blood sugar pills (oral hypoglycemic), then do not take them on the morning of your procedure.  You may take them after you have had the procedure.  Do not take aspirin or any aspirin-containing medications, at least eleven (11) days prior to the procedure.  They may prolong bleeding.  Wear loose fitting clothing that may be easy to take off and that you would not mind if it got stained with Betadine or blood.  Do not wear any  jewelry or perfume  Remove any nail coloring.  It will interfere with some of our monitoring equipment.  NOTE: Remember that this is not meant to be interpreted as a complete list of all possible complications.  Unforeseen problems may occur.  BLOOD THINNERS The following drugs contain aspirin or other products, which can cause increased bleeding during surgery and should not be taken for 2 weeks prior to and 1 week after surgery.  If you should need take something for relief of minor pain, you may take acetaminophen which is found in Tylenol,m Datril, Anacin-3 and Panadol. It is not blood thinner. The products listed below are.  Do not take any of the products listed  below in addition to any listed on your instruction sheet.  A.P.C or A.P.C with Codeine Codeine Phosphate Capsules #3 Ibuprofen Ridaura  ABC compound Congesprin Imuran rimadil  Advil Cope Indocin Robaxisal  Alka-Seltzer Effervescent Pain Reliever and Antacid Coricidin or Coricidin-D  Indomethacin Rufen  Alka-Seltzer plus Cold Medicine Cosprin Ketoprofen S-A-C Tablets  Anacin Analgesic Tablets or Capsules Coumadin Korlgesic Salflex  Anacin Extra Strength Analgesic tablets or capsules CP-2 Tablets Lanoril Salicylate  Anaprox Cuprimine Capsules Levenox Salocol  Anexsia-D Dalteparin Magan Salsalate  Anodynos Darvon compound Magnesium Salicylate Sine-off  Ansaid Dasin Capsules Magsal Sodium Salicylate  Anturane Depen Capsules Marnal Soma  APF Arthritis pain formula Dewitt's Pills Measurin Stanback  Argesic Dia-Gesic Meclofenamic Sulfinpyrazone  Arthritis Bayer Timed Release Aspirin Diclofenac Meclomen Sulindac  Arthritis pain formula Anacin Dicumarol Medipren Supac  Analgesic (Safety coated) Arthralgen Diffunasal Mefanamic Suprofen  Arthritis Strength Bufferin Dihydrocodeine Mepro Compound Suprol  Arthropan liquid Dopirydamole Methcarbomol with Aspirin Synalgos  ASA tablets/Enseals Disalcid Micrainin Tagament  Ascriptin Doan's  Midol Talwin  Ascriptin A/D Dolene Mobidin Tanderil  Ascriptin Extra Strength Dolobid Moblgesic Ticlid  Ascriptin with Codeine Doloprin or Doloprin with Codeine Momentum Tolectin  Asperbuf Duoprin Mono-gesic Trendar  Aspergum Duradyne Motrin or Motrin IB Triminicin  Aspirin plain, buffered or enteric coated Durasal Myochrisine Trigesic  Aspirin Suppositories Easprin Nalfon Trillsate  Aspirin with Codeine Ecotrin Regular or Extra Strength Naprosyn Uracel  Atromid-S Efficin Naproxen Ursinus  Auranofin Capsules Elmiron Neocylate Vanquish  Axotal Emagrin Norgesic Verin  Azathioprine Empirin or Empirin with Codeine Normiflo Vitamin E  Azolid Emprazil Nuprin Voltaren  Bayer Aspirin plain, buffered or children's or timed BC Tablets or powders Encaprin Orgaran Warfarin Sodium  Buff-a-Comp Enoxaparin Orudis Zorpin  Buff-a-Comp with Codeine Equegesic Os-Cal-Gesic   Buffaprin Excedrin plain, buffered or Extra Strength Oxalid   Bufferin Arthritis Strength Feldene Oxphenbutazone   Bufferin plain or Extra Strength Feldene Capsules Oxycodone with Aspirin   Bufferin with Codeine Fenoprofen Fenoprofen Pabalate or Pabalate-SF   Buffets II Flogesic Panagesic   Buffinol plain or Extra Strength Florinal or Florinal with Codeine Panwarfarin   Buf-Tabs Flurbiprofen Penicillamine   Butalbital Compound Four-way cold tablets Penicillin   Butazolidin Fragmin Pepto-Bismol   Carbenicillin Geminisyn Percodan   Carna Arthritis Reliever Geopen Persantine   Carprofen Gold's salt Persistin   Chloramphenicol Goody's Phenylbutazone   Chloromycetin Haltrain Piroxlcam   Clmetidine heparin Plaquenil   Cllnoril Hyco-pap Ponstel   Clofibrate Hydroxy chloroquine Propoxyphen         Before stopping any of these medications, be sure to consult the physician who ordered them.  Some, such as Coumadin (Warfarin) are ordered to prevent or treat serious conditions such as "deep thrombosis", "pumonary embolisms", and other heart  problems.  The amount of time that you may need off of the medication may also vary with the medication and the reason for which you were taking it.  If you are taking any of these medications, please make sure you notify your pain physician before you undergo any procedures.         Pain Management Discharge Instructions  General Discharge Instructions :  If you need to reach your doctor call: Monday-Friday 8:00 am - 4:00 pm at [phone] or toll free [phone].  After clinic hours [phone] to have operator reach doctor.  Bring all of your medication bottles to all your appointments in the pain clinic.  To cancel or reschedule your appointment with Pain Management please remember to call 24 hours in advance to avoid a fee.  Refer to the educational materials which you have been given on: General Risks, I had my Procedure. Discharge Instructions, Post Sedation.  Post Procedure Instructions:  The drugs you were given will stay in your system until tomorrow, so for the next 24 hours you should not drive, make any legal decisions or drink any alcoholic beverages.  You may eat anything you prefer, but it is better to start with liquids then soups and crackers, and gradually work up to solid foods.  Please notify your doctor immediately if you have any unusual bleeding, trouble breathing or pain that is not related to your normal pain.  Depending on the type of procedure that was done, some parts of your body may feel week and/or numb.  This usually clears up by tonight or the next day.  Walk with the use of an assistive device or accompanied by an adult for the 24 hours.  You may use ice on the affected area for the first 24 hours.  Put ice in a Ziploc bag and cover with a towel and place against area 15 minutes on 15 minutes off.  You may switch to heat after 24 hours.

## 2017-06-28 ENCOUNTER — Ambulatory Visit
Admission: RE | Admit: 2017-06-28 | Discharge: 2017-06-28 | Disposition: A | Discharge status: Home / Self Care | Payer: Medicaid Other | Source: Ambulatory Visit | Attending: Student in an Organized Health Care Education/Training Program | Admitting: Student in an Organized Health Care Education/Training Program

## 2017-06-28 ENCOUNTER — Telehealth: Payer: Self-pay | Admitting: *Deleted

## 2017-06-28 ENCOUNTER — Encounter: Payer: Self-pay | Admitting: Student in an Organized Health Care Education/Training Program

## 2017-06-28 ENCOUNTER — Ambulatory Visit (HOSPITAL_BASED_OUTPATIENT_CLINIC_OR_DEPARTMENT_OTHER): Payer: Medicaid Other | Admitting: Student in an Organized Health Care Education/Training Program

## 2017-06-28 VITALS — BP 109/83 | HR 90 | Resp 18 | Ht 73.0 in | Wt 196.0 lb

## 2017-06-28 DIAGNOSIS — M25511 Pain in right shoulder: Secondary | ICD-10-CM | POA: Diagnosis not present

## 2017-06-28 DIAGNOSIS — M25512 Pain in left shoulder: Secondary | ICD-10-CM | POA: Insufficient documentation

## 2017-06-28 DIAGNOSIS — Z951 Presence of aortocoronary bypass graft: Secondary | ICD-10-CM | POA: Diagnosis not present

## 2017-06-28 DIAGNOSIS — J849 Interstitial pulmonary disease, unspecified: Secondary | ICD-10-CM | POA: Insufficient documentation

## 2017-06-28 DIAGNOSIS — I2510 Atherosclerotic heart disease of native coronary artery without angina pectoris: Secondary | ICD-10-CM | POA: Diagnosis not present

## 2017-06-28 DIAGNOSIS — M069 Rheumatoid arthritis, unspecified: Secondary | ICD-10-CM | POA: Insufficient documentation

## 2017-06-28 DIAGNOSIS — I714 Abdominal aortic aneurysm, without rupture: Secondary | ICD-10-CM | POA: Diagnosis not present

## 2017-06-28 DIAGNOSIS — M25562 Pain in left knee: Secondary | ICD-10-CM | POA: Diagnosis present

## 2017-06-28 DIAGNOSIS — Z8673 Personal history of transient ischemic attack (TIA), and cerebral infarction without residual deficits: Secondary | ICD-10-CM | POA: Diagnosis not present

## 2017-06-28 DIAGNOSIS — M170 Bilateral primary osteoarthritis of knee: Secondary | ICD-10-CM | POA: Insufficient documentation

## 2017-06-28 DIAGNOSIS — M25561 Pain in right knee: Secondary | ICD-10-CM | POA: Diagnosis present

## 2017-06-28 MED ORDER — ROPIVACAINE HCL 2 MG/ML IJ SOLN
9.0000 mL | Freq: Once | INTRAMUSCULAR | Status: AC
  Administered 2017-06-28: 10 mL
  Filled 2017-06-28: qty 10

## 2017-06-28 MED ORDER — LIDOCAINE HCL (PF) 1 % IJ SOLN
10.0000 mL | Freq: Once | INTRAMUSCULAR | Status: AC
  Administered 2017-06-28: 5 mL
  Filled 2017-06-28: qty 10
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
## 2017-06-28 NOTE — Telephone Encounter (Signed)
Called Pharmacy and they will be sending the PA form

## 2017-06-28 NOTE — Progress Notes (Signed)
Safety precautions to be maintained throughout the outpatient stay will include: orient to surroundings, keep bed in low position, maintain call bell within reach at all times, provide assistance with transfer out of bed and ambulation.  

## 2017-06-28 NOTE — Patient Instructions (Signed)
Knee Injection A knee injection is a procedure to get medicine into your knee joint. Your health care provider puts a needle into the joint and injects medicine with an attached syringe. The injected medicine may relieve the pain, swelling, and stiffness of arthritis. The injected medicine may also help to lubricate and cushion your knee joint. You may need more than one injection. Tell a health care provider about:  Any allergies you have.  All medicines you are taking, including vitamins, herbs, eye drops, creams, and over-the-counter medicines.  Any problems you or family members have had with anesthetic medicines.  Any blood disorders you have.  Any surgeries you have had.  Any medical conditions you have. What are the risks? Generally, this is a safe procedure. However, problems may occur, including:  Infection.  Bleeding.  Worsening symptoms.  Damage to the area around your knee.  Allergic reaction to any of the medicines.  Skin reactions from repeated injections.  What happens before the procedure?  Ask your health care provider about changing or stopping your regular medicines. This is especially important if you are taking diabetes medicines or blood thinners.  Plan to have someone take you home after the procedure. What happens during the procedure?  You will sit or lie down in a position for your knee to be treated.  The skin over your kneecap will be cleaned with a germ-killing solution (antiseptic).  You will be given a medicine that numbs the area (local anesthetic). You may feel some stinging.  After your knee becomes numb, you will have a second injection. This is the medicine. This needle is carefully placed between your kneecap and your knee. The medicine is injected into the joint space.  At the end of the procedure, the needle will be removed.  A bandage (dressing) may be placed over the injection site. The procedure may vary among health care  providers and hospitals. What happens after the procedure?  You may have to move your knee through its full range of motion. This helps to get all of the medicine into your joint space.  Your blood pressure, heart rate, breathing rate, and blood oxygen level will be monitored often until the medicines you were given have worn off.  You will be watched to make sure that you do not have a reaction to the injected medicine. This information is not intended to replace advice given to you by your health care provider. Make sure you discuss any questions you have with your health care provider. Document Released: 01/10/2007 Document Revised: 03/20/2016 Document Reviewed: 08/29/2014 Elsevier Interactive Patient Education  2018 [employer]  

## 2017-06-29 ENCOUNTER — Encounter: Payer: Self-pay | Admitting: Internal Medicine

## 2017-06-29 ENCOUNTER — Telehealth: Payer: Self-pay

## 2017-06-29 NOTE — Telephone Encounter (Signed)
D/w Dr Renard Matter earlier 06/29/2017 -0 she will start patient on immuran  Dr. Kalman Shan, M.D., [facility].C.P Pulmonary and Critical Care Medicine Staff Physician [facility] Pager: [phone], If no answer or between  15:00h - 7:00h: call [phone]  06/29/2017 11:12 PM

## 2017-06-29 NOTE — Telephone Encounter (Signed)
Called Dr. Ardith Dark office and MR's cell number has been given to Dr. Renard Matter.   Will forward to MR to make aware to look for Dr. Ardith Dark call on cell.

## 2017-06-29 NOTE — Telephone Encounter (Signed)
Post procedure phone call.  His sister states he is doing good.  Instructed to call for any questions or concerns.

## 2017-07-01 ENCOUNTER — Other Ambulatory Visit: Payer: Medicaid Other | Admitting: *Deleted

## 2017-07-01 DIAGNOSIS — I5042 Chronic combined systolic (congestive) and diastolic (congestive) heart failure: Secondary | ICD-10-CM

## 2017-07-01 LAB — BASIC METABOLIC PANEL
BUN/Creatinine Ratio: 17 (ref 10–24)
BUN: 18 mg/dL (ref 8–27)
CALCIUM: 9.2 mg/dL (ref 8.6–10.2)
CO2: 20 mmol/L (ref 20–29)
CREATININE: 1.05 mg/dL (ref 0.76–1.27)
Chloride: 99 mmol/L (ref 96–106)
GFR calc Af Amer: 88 mL/min/{1.73_m2} (ref >59)
GFR calc non Af Amer: 76 mL/min/{1.73_m2} (ref >59)
GLUCOSE: 112 mg/dL — AB (ref 65–99)
Potassium: 4.3 mmol/L (ref 3.5–5.2)
SODIUM: 134 mmol/L (ref 134–144)

## 2017-07-08 ENCOUNTER — Telehealth: Payer: Self-pay | Admitting: Cardiology

## 2017-07-08 NOTE — Telephone Encounter (Signed)
Pts wife is calling about the pts last 3 BP readings.  Wife is concerned with one reading in particular.  Wife reports readings as so:   9/2- 145/82 and in PM 12/80 9/3--Wife didn't log his BP on this date 9/4--131/75 9/5--didn't log BP 9/6--154/104  Wife reports the pt is completely ASYMPTOMATIC.  Wife reports that the pt has been in extreme pain with his newly diagnosed Rheumatoid arthritis.  Wife also wanted to report to Dr Delton See that she did give the pt foods that contained high amounts of sodium in them, last night for dinner.  Wife states its "probably nothing, just pain related or because I gave him too much sodium." Wife just wanted to make Dr Delton See aware of this, encase she felt med changes should be made.  Advised the pts wife to encourage the pt to stay plenty hydrated and reduce the sodium in his diet.  Also advised the pts wife that he should utilize his pain meds as needed, for his RA.  Informed the pts wife that I will route this information to Dr Delton See for further review and recommendation, and follow-up shortly thereafter.  Pts wife verbalized understanding and agrees with this plan.

## 2017-07-08 NOTE — Telephone Encounter (Signed)
New message    Pt sister is calling to report pt blood pressure.   Pt c/o BP issue: STAT if pt c/o blurred vision, one-sided weakness or slurred speech  1. What are your last 5 BP readings? 154/104  2. Are you having any other symptoms (ex. Dizziness, headache, blurred vision, passed out)? No   3. What is your BP issue? Pt BP is running high.

## 2017-07-08 NOTE — Telephone Encounter (Signed)
I would continue checking, make sure recheck once pain under control, if still uncontrolled we will increase lisinopril to 40 mg daily.

## 2017-07-08 NOTE — Telephone Encounter (Signed)
Left a message for the pt and/or sister to call back.

## 2017-07-09 NOTE — Telephone Encounter (Signed)
Spoke with the pt and wife and informed them both that Dr Delton See recommends that they keep checking his BPs, make sure to recheck once pain is under control, and if its still uncontrolled, then we will increase his lisinopril to 40 mg po daily.  Per the pt and wife, they verbalized understanding and agreed to this plan.  Both state they will keep [place] updated if his BP readings are uncontrolled, to receive increased lisinopril order.  Both gracious for all the assistance provided.

## 2017-07-09 NOTE — Telephone Encounter (Signed)
Follow up ° ° °Pt calling back °

## 2017-07-15 ENCOUNTER — Ambulatory Visit
Payer: Medicaid Other | Attending: Student in an Organized Health Care Education/Training Program | Admitting: Student in an Organized Health Care Education/Training Program

## 2017-07-15 ENCOUNTER — Encounter: Payer: Self-pay | Admitting: Student in an Organized Health Care Education/Training Program

## 2017-07-15 VITALS — BP 150/100 | HR 88 | Temp 97.8°F | Resp 18 | Ht 73.0 in | Wt 196.0 lb

## 2017-07-15 DIAGNOSIS — D649 Anemia, unspecified: Secondary | ICD-10-CM | POA: Insufficient documentation

## 2017-07-15 DIAGNOSIS — Z8249 Family history of ischemic heart disease and other diseases of the circulatory system: Secondary | ICD-10-CM | POA: Diagnosis not present

## 2017-07-15 DIAGNOSIS — Z79899 Other long term (current) drug therapy: Secondary | ICD-10-CM | POA: Diagnosis not present

## 2017-07-15 DIAGNOSIS — Z8673 Personal history of transient ischemic attack (TIA), and cerebral infarction without residual deficits: Secondary | ICD-10-CM | POA: Diagnosis not present

## 2017-07-15 DIAGNOSIS — G894 Chronic pain syndrome: Secondary | ICD-10-CM | POA: Insufficient documentation

## 2017-07-15 DIAGNOSIS — Z823 Family history of stroke: Secondary | ICD-10-CM | POA: Diagnosis not present

## 2017-07-15 DIAGNOSIS — Z7952 Long term (current) use of systemic steroids: Secondary | ICD-10-CM | POA: Insufficient documentation

## 2017-07-15 DIAGNOSIS — Z791 Long term (current) use of non-steroidal anti-inflammatories (NSAID): Secondary | ICD-10-CM | POA: Diagnosis not present

## 2017-07-15 DIAGNOSIS — M25542 Pain in joints of left hand: Secondary | ICD-10-CM

## 2017-07-15 DIAGNOSIS — M25541 Pain in joints of right hand: Secondary | ICD-10-CM | POA: Diagnosis not present

## 2017-07-15 DIAGNOSIS — Z8739 Personal history of other diseases of the musculoskeletal system and connective tissue: Secondary | ICD-10-CM

## 2017-07-15 DIAGNOSIS — G629 Polyneuropathy, unspecified: Secondary | ICD-10-CM | POA: Insufficient documentation

## 2017-07-15 DIAGNOSIS — I2510 Atherosclerotic heart disease of native coronary artery without angina pectoris: Secondary | ICD-10-CM | POA: Insufficient documentation

## 2017-07-15 DIAGNOSIS — M2550 Pain in unspecified joint: Secondary | ICD-10-CM

## 2017-07-15 DIAGNOSIS — Z760 Encounter for issue of repeat prescription: Secondary | ICD-10-CM | POA: Insufficient documentation

## 2017-07-15 DIAGNOSIS — E7800 Pure hypercholesterolemia, unspecified: Secondary | ICD-10-CM | POA: Diagnosis not present

## 2017-07-15 DIAGNOSIS — R911 Solitary pulmonary nodule: Secondary | ICD-10-CM | POA: Diagnosis not present

## 2017-07-15 DIAGNOSIS — Z951 Presence of aortocoronary bypass graft: Secondary | ICD-10-CM | POA: Diagnosis not present

## 2017-07-15 DIAGNOSIS — I110 Hypertensive heart disease with heart failure: Secondary | ICD-10-CM | POA: Insufficient documentation

## 2017-07-15 DIAGNOSIS — I5042 Chronic combined systolic (congestive) and diastolic (congestive) heart failure: Secondary | ICD-10-CM | POA: Insufficient documentation

## 2017-07-15 DIAGNOSIS — M170 Bilateral primary osteoarthritis of knee: Secondary | ICD-10-CM | POA: Insufficient documentation

## 2017-07-15 DIAGNOSIS — Z7982 Long term (current) use of aspirin: Secondary | ICD-10-CM | POA: Insufficient documentation

## 2017-07-15 DIAGNOSIS — F17210 Nicotine dependence, cigarettes, uncomplicated: Secondary | ICD-10-CM | POA: Diagnosis not present

## 2017-07-15 DIAGNOSIS — M25561 Pain in right knee: Secondary | ICD-10-CM

## 2017-07-15 DIAGNOSIS — I714 Abdominal aortic aneurysm, without rupture: Secondary | ICD-10-CM | POA: Diagnosis not present

## 2017-07-15 DIAGNOSIS — Z9889 Other specified postprocedural states: Secondary | ICD-10-CM | POA: Diagnosis not present

## 2017-07-15 DIAGNOSIS — J9601 Acute respiratory failure with hypoxia: Secondary | ICD-10-CM | POA: Insufficient documentation

## 2017-07-15 DIAGNOSIS — M25562 Pain in left knee: Secondary | ICD-10-CM

## 2017-07-15 DIAGNOSIS — M069 Rheumatoid arthritis, unspecified: Secondary | ICD-10-CM | POA: Insufficient documentation

## 2017-07-15 MED ORDER — HYDROCODONE-ACETAMINOPHEN 5-325 MG PO TABS: 1.0000 | ORAL_TABLET | Freq: Two times a day (BID) | ORAL | 0 refills | Status: DC | PRN

## 2017-07-15 MED ORDER — HYDROCODONE-ACETAMINOPHEN 5-325 MG PO TABS: 1.0000 | ORAL_TABLET | Freq: Two times a day (BID) | ORAL | 0 refills | Status: AC | PRN

## 2017-07-15 NOTE — Patient Instructions (Signed)
You were given 2 prescriptions for Hydrocodone today. 

## 2017-07-15 NOTE — Progress Notes (Signed)
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  08/06/2017 11:00 AM [facility] HS VASC 1 [facility]  08/17/2017 1:00 PM [facility]-CT 1 [facility]-CT [facility]  09/07/2017 1:45 PM Gillis Santa, MD [facility] None  09/27/2017 11:00 AM [facility] PFT ROOM [facility] None  09/27/2017 11:30 AM Brand Males, MD [place] None    Primary Care Physician: [name], PA-C Location: [facility] Note by: Gillis Santa, M.D Date: 07/15/2017; Time: 1:43 PM  Patient Instructions  You were given 2 prescriptions for Hydrocodone today.

## 2017-07-15 NOTE — Progress Notes (Signed)
Nursing Pain Medication Assessment:  Safety precautions to be maintained throughout the outpatient stay will include: orient to surroundings, keep bed in low position, maintain call bell within reach at all times, provide assistance with transfer out of bed and ambulation.  Medication Inspection Compliance: Pill count conducted under aseptic conditions, in front of the patient. Neither the pills nor the bottle was removed from the patient's sight at any time. Once count was completed pills were immediately returned to the patient in their original bottle.  Medication: Hydrocodone/APAP Pill/Patch Count: 12 of 60 pills remain Pill/Patch Appearance: Markings consistent with prescribed medication Bottle Appearance: Standard pharmacy container. Clearly labeled. Filled Date:08/30/ 2018 Last Medication intake:  Today

## 2017-07-19 ENCOUNTER — Telehealth: Payer: Self-pay | Admitting: Internal Medicine

## 2017-07-19 DIAGNOSIS — J849 Interstitial pulmonary disease, unspecified: Secondary | ICD-10-CM

## 2017-07-19 NOTE — Telephone Encounter (Signed)
ono 06/30/27 </= 54 min  Plan Start 2L [place] at night  Dr. Kalman Shan, M.D., [facility].C.P Pulmonary and Critical Care Medicine Staff Physician [facility] Pager: [phone], If no answer or between  15:00h - 7:00h: call [phone]  07/19/2017 12:46 PM

## 2017-07-19 NOTE — Telephone Encounter (Signed)
Called pt and spoke with sister [name] letting her know of the pt's ONO results. Told her that MR wanted pt to begin 2L O2 at night. [name] expressed understanding. Will place the order for O2.  Nothing further needed.

## 2017-07-21 ENCOUNTER — Other Ambulatory Visit: Payer: Self-pay | Admitting: Physician Assistant

## 2017-07-21 MED ORDER — VARENICLINE TARTRATE 1 MG PO TABS: 1.0000 mg | ORAL_TABLET | Freq: Two times a day (BID) | ORAL | 1 refills | Status: DC

## 2017-07-21 NOTE — Telephone Encounter (Signed)
Pt's Rx was faxed to pt's pharmacy, [facility] on [address] At [phone]. Confirmation received.

## 2017-07-21 NOTE — Telephone Encounter (Signed)
May prescribe 1 continuing pack (1mg  po BID) with 1 refill. Do not refill the above dosing because it starts all over with the taper. Thanks! Dayna Dunn PA-C

## 2017-07-21 NOTE — Telephone Encounter (Signed)
Pt's pharmacy requesting a refill on Chantix. Would you like to refill this medication? Please advise

## 2017-07-30 ENCOUNTER — Ambulatory Visit: Payer: Medicaid Other | Admitting: Vascular Surgery

## 2017-07-30 ENCOUNTER — Encounter (HOSPITAL_COMMUNITY): Payer: Medicaid Other

## 2017-08-02 ENCOUNTER — Telehealth: Payer: Self-pay | Admitting: Physician Assistant

## 2017-08-02 ENCOUNTER — Other Ambulatory Visit: Payer: Self-pay | Admitting: Physician Assistant

## 2017-08-02 NOTE — Telephone Encounter (Signed)
New message    Patient sister said he has temp of 100.3 , cold sweats, shaking, abdomin is swollen and bp is running low , weak having problem to get out of bed   Pt c/o BP issue: STAT if pt c/o blurred vision, one-sided weakness or slurred speech  1. What are your last 5 BP readings?119/81 98.3 this morning  80/40 yesterday  101 temp ,   2. Are you having any other symptoms (ex. Dizziness, headache, blurred vision, passed out)? Headache 2 nights ago   3. What is your BP issue?  Has been low all weekend

## 2017-08-02 NOTE — Telephone Encounter (Signed)
Received call transferred directly from operator and spoke with pt's sister.  She reports pt has had 2 rough nights. Temp was 103 degrees last night but today is not running a fever.  He is shaking and sweating and has abdominal sweating.  No increased shortness of breath. Wears home oxygen. No cough. I instructed sister that pt should go to ED for evaluation.

## 2017-08-03 NOTE — Progress Notes (Signed)
Established Abdominal Aortic Aneurysm   History of Present Illness   The patient is a 61 y.o. (01-21-1956) male who presents with chief complaint: follow up for AAA.  Previous studies demonstrate an AAA, measuring 3.9 cm x 4.0 cm.  The patient does no have back or abdominal pain.  The patient is a active smoker.  Additionally, patient reported had a R CEA at [facility] in 11/09/16.  The patient has had no recent CVA and TIA sx.  The patient's PMH, PSH, SH, and FamHx are unchanged from 05/12/17.   Current Outpatient Prescriptions  Medication Sig Dispense Refill  . acetaminophen (TYLENOL) 500 MG tablet Take 1,000 mg by mouth every 6 (six) hours as needed for mild pain.     [name] albuterol (ACCUNEB) 0.63 MG/3ML nebulizer solution Take 3 mLs (0.63 mg total) by nebulization every 6 (six) hours as needed for wheezing. (Patient not taking: Reported on 07/15/2017) 75 mL 12  . albuterol (PROVENTIL HFA;VENTOLIN HFA) 108 (90 Base) MCG/ACT inhaler Inhale 2 puffs into the lungs every 6 (six) hours as needed for wheezing or shortness of breath. 1 Inhaler 6  . aspirin 81 MG chewable tablet Chew 1 tablet (81 mg total) by mouth daily. 30 tablet 0  . atorvastatin (LIPITOR) 40 MG tablet Take 1 tablet (40 mg total) by mouth daily at 6 PM. 30 tablet 11  . carvedilol (COREG) 12.5 MG tablet Take 1 tablet (12.5 mg total) by mouth 2 (two) times daily. 60 tablet 11  . gabapentin (NEURONTIN) 300 MG capsule Take 1 capsule (300 mg total) by mouth every 8 (eight) hours. 90 capsule 2  . HYDROcodone-acetaminophen (NORCO/VICODIN) 5-325 MG tablet Take 1 tablet by mouth 2 (two) times daily as needed for moderate pain. 60 tablet 0  . lisinopril (PRINIVIL,ZESTRIL) 20 MG tablet Take 1 tablet (20 mg total) by mouth daily. (Patient taking differently: Take 20 mg by mouth 2 (two) times daily. ) 90 tablet 3  . meloxicam (MOBIC) 7.5 MG tablet Take 1 tablet (7.5 mg total) by mouth 2 (two) times daily. 28 tablet 0  . Menthol-Methyl  Salicylate (MUSCLE RUB EX) Apply 1 application topically as needed (for shoulder or knee pain).     . predniSONE (DELTASONE) 5 MG tablet TAKE 2 TABS DAILY    . spironolactone (ALDACTONE) 25 MG tablet Take 1 tablet (25 mg total) by mouth daily. 30 tablet 11  . sulfaSALAzine (AZULFIDINE) 500 MG EC tablet Take 500 mg by mouth 4 (four) times daily.    . varenicline (CHANTIX CONTINUING MONTH PAK) 1 MG tablet Take 1 tablet (1 mg total) by mouth 2 (two) times daily. After completion of starter pak 60 tablet 1   No current facility-administered medications for this visit.     On ROS today: no CVA or TIA sx, no abdominal or back pain   Physical Examination   Vitals:   08/06/17 1046  BP: 100/71  Pulse: 94  Resp: 16  SpO2: 96%  Weight: 203 lb 9.6 oz (92.4 kg)  Height: 5' 11.75" (1.822 m)   Body mass index is 27.81 kg/m.  General Alert, O x 3, WD, NAD  Pulmonary Sym exp, good B air movt, CTA B  Cardiac RRR, Nl S1, S2, no Murmurs, No rubs, No S3,S4  Vascular Vessel Right Left  Radial Palpable Palpable  Brachial Palpable Palpable  Carotid Palpable, No Bruit Palpable, No Bruit  Aorta Not palpable N/A  Femoral Palpable Palpable  Popliteal Not palpable Not palpable  PT  Faintly palpable Palpable  DP Palpable Palpable    Gastro- intestinal soft, non-distended, non-tender to palpation, No guarding or rebound, no HSM, no masses, no CVAT B, No palpable prominent aortic pulse,    Musculo- skeletal M/S 5/5 throughout  , Extremities without ischemic changes  , No edema present, Varicosities present: B, Lipodermatosclerosis present: B  Neurologic Cranial nerves 2-12 intact , Pain and light touch intact in extremities , Motor exam as listed above     Non-invasive Vascular Imaging     B Carotid Duplex (08/06/2017):   R ICA stenosis:  patent CEA  R VA: patent and antegrade  L ICA stenosis:  60-79%  L VA: patent and antegrade  ABI (08/06/2017)  R:   ABI: 1.01,   PT: tri  DP:  tri  TBI:  0.81  L:   ABI: 1.10,   PT: tri  DP: tri  TBI: 0.79  AAA Duplex (08/06/2017)  Current size: 3.9 cm x 4.4 cm  Previous size: 3.9 cm x 4 cm (04/02/17)  R CIA: 1.1 cm x 0.9 cm  L CIA: 1.0 cm x 1.2 cm   Medical Decision Making   ESGAR BARNICK is a 61 y.o. (07/29/1956) male who presents with: asymptomatic AAA with slowly increasing size, s/p R CEA, asx L ICA stenosis 60-79%   Based on this patient's exam and diagnostic studies, he needs q6 B carotid duplex and AAA duplex.  The threshold for repair is AAA size > 5.5 cm, growth > 1 cm/yr, and symptomatic status.  I emphasized the importance of maximal medical management including strict control of blood pressure, blood glucose, and lipid levels, antiplatelet agents, obtaining regular exercise, and cessation of smoking.    Thank you for allowing [place] to participate in this patient's care.   Leonides Sake, MD, FACS [facility] Office: [phone] Pager: [phone]

## 2017-08-03 NOTE — Telephone Encounter (Signed)
Please call patient's pharmacy - a continuing pack prescription was already sent in in September for the 1mg  BID dose (see med list). Aurie Harroun PA-C

## 2017-08-06 ENCOUNTER — Encounter: Payer: Self-pay | Admitting: Vascular Surgery

## 2017-08-06 ENCOUNTER — Ambulatory Visit (HOSPITAL_COMMUNITY)
Admission: RE | Admit: 2017-08-06 | Discharge: 2017-08-06 | Disposition: A | Discharge status: Home / Self Care | Payer: Medicaid Other | Source: Ambulatory Visit | Attending: Vascular Surgery | Admitting: Vascular Surgery

## 2017-08-06 ENCOUNTER — Ambulatory Visit (INDEPENDENT_AMBULATORY_CARE_PROVIDER_SITE_OTHER): Payer: Medicaid Other | Admitting: Vascular Surgery

## 2017-08-06 ENCOUNTER — Ambulatory Visit: Payer: Medicaid Other | Admitting: Vascular Surgery

## 2017-08-06 ENCOUNTER — Ambulatory Visit (INDEPENDENT_AMBULATORY_CARE_PROVIDER_SITE_OTHER)
Admission: RE | Admit: 2017-08-06 | Discharge: 2017-08-06 | Disposition: A | Discharge status: Home / Self Care | Payer: Medicaid Other | Source: Ambulatory Visit | Attending: Vascular Surgery | Admitting: Vascular Surgery

## 2017-08-06 VITALS — BP 100/71 | HR 94 | Resp 16 | Ht 71.75 in | Wt 203.6 lb

## 2017-08-06 DIAGNOSIS — I714 Abdominal aortic aneurysm, without rupture, unspecified: Secondary | ICD-10-CM

## 2017-08-06 DIAGNOSIS — I739 Peripheral vascular disease, unspecified: Secondary | ICD-10-CM | POA: Diagnosis not present

## 2017-08-06 DIAGNOSIS — I779 Disorder of arteries and arterioles, unspecified: Secondary | ICD-10-CM | POA: Insufficient documentation

## 2017-08-06 DIAGNOSIS — I6523 Occlusion and stenosis of bilateral carotid arteries: Secondary | ICD-10-CM | POA: Diagnosis not present

## 2017-08-06 LAB — VAS US CAROTID
LCCADDIAS: 77 cm/s
LEFT ECA DIAS: -30 cm/s
LICADDIAS: -50 cm/s
LICAPDIAS: 102 cm/s
LICAPSYS: 230 cm/s
Left CCA dist sys: 207 cm/s
Left CCA prox dias: 31 cm/s
Left CCA prox sys: 95 cm/s
Left ICA dist sys: -103 cm/s
RCCADSYS: -80 cm/s
RCCAPSYS: -96 cm/s
RIGHT CCA MID DIAS: 35 cm/s
RIGHT ECA DIAS: -23 cm/s
Right CCA prox dias: -36 cm/s

## 2017-08-08 ENCOUNTER — Emergency Department (HOSPITAL_COMMUNITY): Payer: Medicaid Other

## 2017-08-08 ENCOUNTER — Inpatient Hospital Stay (HOSPITAL_COMMUNITY)
Admission: EM | Admit: 2017-08-08 | Discharge: 2017-08-11 | DRG: 439 | Disposition: A | Discharge status: Home / Self Care | Payer: Medicaid Other | Attending: Family Medicine | Admitting: Family Medicine

## 2017-08-08 ENCOUNTER — Encounter (HOSPITAL_COMMUNITY): Payer: Self-pay | Admitting: Emergency Medicine

## 2017-08-08 DIAGNOSIS — R51 Headache: Secondary | ICD-10-CM | POA: Diagnosis present

## 2017-08-08 DIAGNOSIS — Z8249 Family history of ischemic heart disease and other diseases of the circulatory system: Secondary | ICD-10-CM

## 2017-08-08 DIAGNOSIS — Z9981 Dependence on supplemental oxygen: Secondary | ICD-10-CM | POA: Diagnosis not present

## 2017-08-08 DIAGNOSIS — J849 Interstitial pulmonary disease, unspecified: Secondary | ICD-10-CM | POA: Diagnosis present

## 2017-08-08 DIAGNOSIS — I5042 Chronic combined systolic (congestive) and diastolic (congestive) heart failure: Secondary | ICD-10-CM | POA: Diagnosis present

## 2017-08-08 DIAGNOSIS — E7800 Pure hypercholesterolemia, unspecified: Secondary | ICD-10-CM | POA: Diagnosis present

## 2017-08-08 DIAGNOSIS — D649 Anemia, unspecified: Secondary | ICD-10-CM | POA: Diagnosis present

## 2017-08-08 DIAGNOSIS — I714 Abdominal aortic aneurysm, without rupture: Secondary | ICD-10-CM | POA: Diagnosis present

## 2017-08-08 DIAGNOSIS — Z8673 Personal history of transient ischemic attack (TIA), and cerebral infarction without residual deficits: Secondary | ICD-10-CM | POA: Diagnosis not present

## 2017-08-08 DIAGNOSIS — K8020 Calculus of gallbladder without cholecystitis without obstruction: Secondary | ICD-10-CM | POA: Diagnosis present

## 2017-08-08 DIAGNOSIS — Z951 Presence of aortocoronary bypass graft: Secondary | ICD-10-CM

## 2017-08-08 DIAGNOSIS — R911 Solitary pulmonary nodule: Secondary | ICD-10-CM | POA: Diagnosis present

## 2017-08-08 DIAGNOSIS — K859 Acute pancreatitis without necrosis or infection, unspecified: Secondary | ICD-10-CM | POA: Diagnosis present

## 2017-08-08 DIAGNOSIS — Z7982 Long term (current) use of aspirin: Secondary | ICD-10-CM

## 2017-08-08 DIAGNOSIS — R748 Abnormal levels of other serum enzymes: Secondary | ICD-10-CM

## 2017-08-08 DIAGNOSIS — I2510 Atherosclerotic heart disease of native coronary artery without angina pectoris: Secondary | ICD-10-CM | POA: Diagnosis present

## 2017-08-08 DIAGNOSIS — I110 Hypertensive heart disease with heart failure: Secondary | ICD-10-CM | POA: Diagnosis present

## 2017-08-08 DIAGNOSIS — F17210 Nicotine dependence, cigarettes, uncomplicated: Secondary | ICD-10-CM | POA: Diagnosis present

## 2017-08-08 DIAGNOSIS — I25810 Atherosclerosis of coronary artery bypass graft(s) without angina pectoris: Secondary | ICD-10-CM | POA: Diagnosis not present

## 2017-08-08 DIAGNOSIS — I252 Old myocardial infarction: Secondary | ICD-10-CM | POA: Diagnosis not present

## 2017-08-08 DIAGNOSIS — M069 Rheumatoid arthritis, unspecified: Secondary | ICD-10-CM | POA: Diagnosis present

## 2017-08-08 DIAGNOSIS — Z823 Family history of stroke: Secondary | ICD-10-CM | POA: Diagnosis not present

## 2017-08-08 DIAGNOSIS — R509 Fever, unspecified: Secondary | ICD-10-CM | POA: Diagnosis not present

## 2017-08-08 DIAGNOSIS — Z7952 Long term (current) use of systemic steroids: Secondary | ICD-10-CM | POA: Diagnosis not present

## 2017-08-08 DIAGNOSIS — T370X5A Adverse effect of sulfonamides, initial encounter: Secondary | ICD-10-CM | POA: Diagnosis present

## 2017-08-08 LAB — LIPASE, BLOOD: Lipase: 949 U/L — ABNORMAL HIGH (ref 11–51)

## 2017-08-08 LAB — URINALYSIS, ROUTINE W REFLEX MICROSCOPIC
BILIRUBIN URINE: NEGATIVE
GLUCOSE, UA: NEGATIVE mg/dL
HGB URINE DIPSTICK: NEGATIVE
Ketones, ur: NEGATIVE mg/dL
LEUKOCYTES UA: NEGATIVE
NITRITE: NEGATIVE
PROTEIN: 100 mg/dL — AB
Specific Gravity, Urine: 1.018 (ref 1.005–1.030)
pH: 5 (ref 5.0–8.0)

## 2017-08-08 LAB — COMPREHENSIVE METABOLIC PANEL
ALK PHOS: 657 U/L — AB (ref 38–126)
ALT: 61 U/L (ref 17–63)
ANION GAP: 7 (ref 5–15)
AST: 108 U/L — ABNORMAL HIGH (ref 15–41)
Albumin: 2.5 g/dL — ABNORMAL LOW (ref 3.5–5.0)
BILIRUBIN TOTAL: 1.2 mg/dL (ref 0.3–1.2)
BUN: 13 mg/dL (ref 6–20)
CALCIUM: 8.4 mg/dL — AB (ref 8.9–10.3)
CO2: 19 mmol/L — ABNORMAL LOW (ref 22–32)
Chloride: 112 mmol/L — ABNORMAL HIGH (ref 101–111)
Creatinine, Ser: 1.06 mg/dL (ref 0.61–1.24)
GFR calc non Af Amer: >60 mL/min (ref >60)
Glucose, Bld: 110 mg/dL — ABNORMAL HIGH (ref 65–99)
POTASSIUM: 4.1 mmol/L (ref 3.5–5.1)
Sodium: 138 mmol/L (ref 135–145)
TOTAL PROTEIN: 6.5 g/dL (ref 6.5–8.1)

## 2017-08-08 LAB — CBC WITH DIFFERENTIAL/PLATELET
BASOS ABS: 0 10*3/uL (ref 0.0–0.1)
BASOS PCT: 0 %
Eosinophils Absolute: 0.2 10*3/uL (ref 0.0–0.7)
Eosinophils Relative: 4 %
HEMATOCRIT: 32.6 % — AB (ref 39.0–52.0)
HEMOGLOBIN: 10.6 g/dL — AB (ref 13.0–17.0)
Lymphocytes Relative: 8 %
Lymphs Abs: 0.4 10*3/uL — ABNORMAL LOW (ref 0.7–4.0)
MCH: 29 pg (ref 26.0–34.0)
MCHC: 32.5 g/dL (ref 30.0–36.0)
MCV: 89.1 fL (ref 78.0–100.0)
Monocytes Absolute: 0.3 10*3/uL (ref 0.1–1.0)
Monocytes Relative: 6 %
NEUTROS ABS: 4.2 10*3/uL (ref 1.7–7.7)
NEUTROS PCT: 82 %
Platelets: 256 10*3/uL (ref 150–400)
RBC: 3.66 MIL/uL — AB (ref 4.22–5.81)
RDW: 15.6 % — AB (ref 11.5–15.5)
WBC: 5.2 10*3/uL (ref 4.0–10.5)

## 2017-08-08 LAB — I-STAT CG4 LACTIC ACID, ED: Lactic Acid, Venous: 1.41 mmol/L (ref 0.5–1.9)

## 2017-08-08 MED ORDER — LACTATED RINGERS IV BOLUS (SEPSIS)
500.0000 mL | Freq: Once | INTRAVENOUS | Status: AC
  Administered 2017-08-08: 500 mL via INTRAVENOUS

## 2017-08-08 MED ORDER — PIPERACILLIN-TAZOBACTAM 3.375 G IVPB 30 MIN
3.3750 g | Freq: Once | INTRAVENOUS | Status: AC
  Administered 2017-08-08: 3.375 g via INTRAVENOUS
  Filled 2017-08-08: qty 50

## 2017-08-08 MED ORDER — FENTANYL CITRATE (PF) 100 MCG/2ML IJ SOLN
50.0000 ug | INTRAMUSCULAR | Status: DC | PRN
  Administered 2017-08-08: 50 ug via INTRAVENOUS
  Filled 2017-08-08: qty 2

## 2017-08-08 MED ORDER — IPRATROPIUM-ALBUTEROL 0.5-2.5 (3) MG/3ML IN SOLN
3.0000 mL | Freq: Once | RESPIRATORY_TRACT | Status: AC
  Administered 2017-08-08: 3 mL via RESPIRATORY_TRACT
  Filled 2017-08-08: qty 3

## 2017-08-08 MED ORDER — DEXTROSE 5 % IV SOLN
1.0000 g | Freq: Once | INTRAVENOUS | Status: AC
  Administered 2017-08-08: 1 g via INTRAVENOUS
  Filled 2017-08-08: qty 10

## 2017-08-08 MED ORDER — DEXTROSE 5 % IV SOLN
500.0000 mg | Freq: Once | INTRAVENOUS | Status: AC
  Administered 2017-08-08: 500 mg via INTRAVENOUS
  Filled 2017-08-08 (×2): qty 500

## 2017-08-08 MED ORDER — IOPAMIDOL (ISOVUE-300) INJECTION 61%
INTRAVENOUS | Status: AC
  Administered 2017-08-08: 100 mL
  Filled 2017-08-08: qty 100

## 2017-08-08 NOTE — ED Triage Notes (Signed)
Per EMS, pt from home with increased shortness of breath, productive cough and fever/chills x 1 month. Pt hx includes CVA, "MI x 5", and abdominal aneurysm all since January. EMS vitals: Temp-101.4, HR-120, SpO2-98% 2L [place], BP-130/94.

## 2017-08-08 NOTE — H&P (Signed)
Family Medicine Teaching [facility] Admission History and Physical Service Pager: [phone]  Patient name: [name] Medical record number: [id number] Date of birth: 03-10-1956 Age: 61 y.o. Gender: male  Primary Care Provider: [name], PA-C Consultants: none Code Status: full (obtained on admission)  Chief Complaint: Abdominal pain  Assessment and Plan: [name] is a 61 y.o. male presenting with abdominal pain and fever. PMH is significant for CAD status post CABG, AAA, right CEA, PAD, combined systolic/diastolic CHF, CVA  with left-sided motor deficit, ILD, lung nodule, tobacco use & normocytic anemia.   Acute Pancreatitis: Risk factors include infection (has fever and tachycardia) and medication (he is on sulfasalazine). He could have also passed a stone. He is not a drinker. No hypercalcemia on BMP. Triglyceride 163 about 4 months ago.  Patient with new onset abdominal pain since afternoon. On exam, abdomen mildly distended but with normal bowel sounds and without tenderness to palpation or rebound tenderness. Murphy's sign negative. No sign of ascites. He has no jaundice. Alkaline phosphatase elevated to 657. Lipase 949. RUQ ultrasound without stone or cholecystitis. CT abdomen with mild edema surrounding the pancreatic head and uncinate process consistent with acute pancreatitis. No pancreatic ductal dilation nor necrosis. Unlikely cholangitis without jaundice, right upper quadrant tenderness or hypotension.  -Admit to telemetry given cardiac history and tachycardia. Attending Dr. Jennette Kettle -D5NSat 100 mL/h for 12 hours. Be cautious given cardiac history -Clear liquid diet. Will advance in the morning if continues to remain pain free -We will keep HR under 120 and MAP over 65 -Strict I and O's -Hold home sulfasalazine -Continue Zosyn -Dilaudid 1 mg 3 times a day when necessary severe pain -Monitor vital signs per floor protocol -Repeat CMP and lipase in the  morning  Concern for sepsis: patient with fever to 101.4 prior to arrival to ED. He is tachycardic in 130's on arrival to ED. So, 2/4 on SIRS. However, his qSOFA is 1. Lactic acid and BP within normal limits. Started on ceftriaxone and azithromycin out of concern for pneumonia given tachypnea to 20s and dyspnea. However, patient reports baseline dyspnea due to underlying interstitial lung disease.  -Switch antibiotic to Zosyn -IV fluid as above -Follow-up cultures (obtain before antibiotic) -Repeat CBC in the morning  CAD s/p CABG: Stable. History of MI 3 status post CABG 4 in 1993, LHC with severe native disease and 2 occluded grafts on LHC in 01/2017. Denies chest pain. Reports baseline dyspnea due to interstitial lung disease. EKG with T-wave abnormalities in lateral leads. -Obtain troponin once if negative will stop -Repeat EKG in the morning -Continue home atorvastatin and aspirin -Reduce Coreg to 3.125 mg twice a day.   AAA/right CEA in 11/2016/PAD: Chronic. Stable. Recent abdominal ultrasound with infrarenal AAA extending from mid to distal aorta with maximum dimension of 3.9 cm x 4.0 cm on 04/05/2017. He had repeat abdominal ultrasound for evaluation of AAA and vascular ABI on 08/06/2017 which have not resulted yet. He was told that his AAA has increased to 4.5 cm. He was told to wait until it increased to 5 cm. AAA 4.2 cm on CT abdomen today. -Outpatient follow-up -Atorvastatin and aspirin as above  Combined systolic/diastolic HF: Chronic. Stable. Last echo on 02/26/2017 with EF of 35-40%, diffuse hypokinesis, some akinesis, G1 DD, mod-dilated and PAPP of 42 mmHg. No signs of fluid overload on admission. Dyspnea likely due to ILD. -Strict I&O's, daily weights -IV fluid as above for pancreatitis. Will closely monitor fluid status -hold home  meloxicam  History of CVA in 11/2016: Chronic. Stable. with  left-sided weakness. Had right CEA.  -Atorvastatin and aspirin as above  History of  rheumatoid arthritis: On sulfasalazine at home. -Hold home sulfasalazine in the setting of acute pancreatitis  History of solitary pulmonary nodule: A 7 mm right upper lobe pulmonary nodule noted on CTA on 02/25/2017 Follow-up CT recommended in 18-24 months. He is followed by pulmonology -Outpatient follow-up  History of interstitial lung disease: Noted on high resolution CT on 04/05/2017. Followed by pulmonology. It appears he has a repeat high-resolution CT scheduled for 08/17/2017. -Touch base with his pulmonologist if he wants to get this CT scan this admission  Tobacco use: smokes about a pack a day since he is 61 years of age. CAD down to half a pack every week in 11/2016 when he had MI. On Chantix outpatient -Offer nicotine patch  Normocytic anemia: Hemoglobin 10.6 (at baseline). MCV 89. Recent anemia panel on 02/25/2017 with iron saturation ratio 5%.  -Repeat anemia panel  FEN/GI: -Clear liquid diet. Heart healthy -IV fluid as above  Prophylaxis:  -Lovenox  Disposition: Admit to telemetry for acute pancreatitis  History of Present Illness:  [name] is a 61 y.o. male presenting with abdominal pain and fever  Patient reports having intermittent fever for about a week. He has a temp to 101.4 at home today. He developed abdominal pain this afternoon and called EMS out of concern for aortic aneurysm rupture. He had AAA duplex 2 days ago. He was told his AAA has increased from 4.0-4.5 cm in diameter over the last 5 months. The results is not read yet but he says he was told that he doesn't need surgery yet.  Patient describes his abdominal pain as vague and distended. Pain is diffuse. He also thinks he might have eaten a lot of chicken noodle soup in the morning that has contributed to his abdominal pain. He denies other GI symptoms such as nausea, vomiting, diarrhea & steatorrhea.  He says his abdominal pain is on and off. He denies previous history of pancreatitis or liver  disease. He denies drinking alcohol. He denies recent new medication. He also reports shortness of breath with exertion for the last several months. He has history of interstitial lung disease. He is followed by pulmonologist. He says shortness breath is at baseline. He denies chest pain or palpitation. He he smoked about a pack a day since he was 61 years of age. He cut down to half a pack a week in January 2018. Denies recreational drug use  ED course: Patient tachycardic to 134 and tachypneic to 22. CBC without leukocytosis. CMP with alkaline phosphatase to 657, AST 108 otherwise normal. Lipase elevated to 949. Lactic acid normal. UA negative except for protein to 100 and rare bacteria. RUQ ultrasound basically normal. CXR suggestive for ILD. Blood and urine culture were obtained. Patient was started on ceftriaxone and azithromycin out of concern for pneumonia. He received lactated Ringer 500 mL as well. Family medicine was called to admit patient for concern about pneumonia and elevated alkaline phosphatase and lipase.   Review of Systems  Constitutional: Positive for fever. Negative for weight loss.  HENT: Negative for sore throat.   Eyes: Negative for blurred vision, photophobia and pain.  Respiratory: Positive for shortness of breath. Negative for cough.        Chronic  Cardiovascular: Negative for chest pain, palpitations, orthopnea and leg swelling.  Gastrointestinal: Positive for abdominal pain and nausea. Negative  for blood in stool, diarrhea, melena and vomiting.  Genitourinary: Positive for hematuria. Negative for dysuria.  Musculoskeletal: Negative for back pain and myalgias.  Skin: Negative for rash.  Neurological: Positive for focal weakness. Negative for speech change, weakness and headaches.       Left-sided weakness at baseline  Endo/Heme/Allergies: Does not bruise/bleed easily.  Psychiatric/Behavioral: Negative for depression and substance abuse. The patient is not  nervous/anxious.    Patient Active Problem List   Diagnosis Date Noted  . Carotid artery disease (HCC) 05/12/2017  . 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 Chronic combined systolic and diastolic CHF (congestive heart failure) (HCC) 03/07/2017  . S/P CABG (coronary artery bypass graft)   . Pure hypercholesterolemia   . Acute combined systolic and diastolic heart failure (HCC)   . Exertional shortness of breath   . Solitary pulmonary nodule   . CAD (coronary artery disease) 02/24/2017  . ILD (interstitial lung disease) (HCC) 02/24/2017  . Normocytic anemia 02/24/2017  . AAA (abdominal aortic aneurysm) (HCC) 02/24/2017  . Abnormal EKG 02/24/2017  . Acute respiratory failure with hypoxia (HCC) 02/24/2017  . History of stroke 02/24/2017  . Essential hypertension 02/24/2017  . Current smoker 02/24/2017    Past Medical History: Past Medical History:  Diagnosis Date  . AAA (abdominal aortic aneurysm) (HCC)   . CAD in native artery    a. h/o MI x 3 s/p CABG x 4 (1993 with Tyrone Sage). b. LHC 01/2017 with severe native disease, 2 grafts occluded.  . Carotid artery disease (HCC)    a. s/p R CEA 11/2016.  [name] Chronic combined systolic and diastolic CHF (congestive heart failure) (HCC)   . ILD (interstitial lung disease) (HCC)   . Lung nodule   . MI (myocardial infarction) (HCC)    x 3   . Normocytic anemia   . NSVT (nonsustained ventricular tachycardia) (HCC)   . RA (rheumatoid arthritis) (HCC)   . Stroke (HCC)   . Tobacco abuse     Past Surgical History: Past Surgical History:  Procedure Laterality Date  . biopsy     Throat   . CARDIAC CATHETERIZATION    . CAROTID ENDARTERECTOMY Left   . LEFT HEART CATH AND CORS/GRAFTS ANGIOGRAPHY N/A 03/01/2017   Procedure: Left Heart Cath and Cors/Grafts Angiography;  Surgeon: Lyn Records, MD;  Location: [facility] INVASIVE CV LAB;  Service: Cardiovascular;  Laterality: N/A;    Social History: Social History  Substance  Use Topics  . Smoking status: Current Some Day Smoker    Packs/day: 0.01    Years: 47.00    Types: Cigarettes  . Smokeless tobacco: Never Used     Comment: 1 cigarette a day 8.21.2018 ee  . Alcohol use No   Additional social history: Lives with his sister who helps with his medication.  Please also refer to relevant sections of EMR.  Family History: Family History  Problem Relation Age of Onset  . Heart murmur Mother   . Suicidality Father   . Stroke Brother   . Heart attack Maternal Grandmother    (If not completed, MUST add something in)  Allergies and Medications: No Known Allergies No current facility-administered medications on file prior to encounter.    Current Outpatient Prescriptions on File Prior to Encounter  Medication Sig Dispense Refill  . acetaminophen (TYLENOL) 500 MG tablet Take 1,000 mg by mouth every 6 (six) hours as needed for mild pain.     [name] albuterol (PROVENTIL HFA;VENTOLIN HFA) 108 (90  Base) MCG/ACT inhaler Inhale 2 puffs into the lungs every 6 (six) hours as needed for wheezing or shortness of breath. 1 Inhaler 6  . aspirin 81 MG chewable tablet Chew 1 tablet (81 mg total) by mouth daily. 30 tablet 0  . atorvastatin (LIPITOR) 40 MG tablet Take 1 tablet (40 mg total) by mouth daily at 6 PM. 30 tablet 11  . carvedilol (COREG) 12.5 MG tablet Take 1 tablet (12.5 mg total) by mouth 2 (two) times daily. 60 tablet 11  . gabapentin (NEURONTIN) 300 MG capsule Take 1 capsule (300 mg total) by mouth every 8 (eight) hours. 90 capsule 2  . HYDROcodone-acetaminophen (NORCO/VICODIN) 5-325 MG tablet Take 1 tablet by mouth 2 (two) times daily as needed for moderate pain. 60 tablet 0  . lisinopril (PRINIVIL,ZESTRIL) 20 MG tablet Take 1 tablet (20 mg total) by mouth daily. (Patient taking differently: Take 20 mg by mouth 2 (two) times daily. ) 90 tablet 3  . meloxicam (MOBIC) 7.5 MG tablet Take 1 tablet (7.5 mg total) by mouth 2 (two) times daily. 28 tablet 0  .  Menthol-Methyl Salicylate (MUSCLE RUB EX) Apply 1 application topically as needed (for shoulder or knee pain).     . OXYGEN Inhale 2 L into the lungs at bedtime.    . predniSONE (DELTASONE) 5 MG tablet TAKE 2 TABS DAILY    . spironolactone (ALDACTONE) 25 MG tablet Take 1 tablet (25 mg total) by mouth daily. 30 tablet 11  . sulfaSALAzine (AZULFIDINE) 500 MG EC tablet Take 500 mg by mouth 4 (four) times daily.    . varenicline (CHANTIX CONTINUING MONTH PAK) 1 MG tablet Take 1 tablet (1 mg total) by mouth 2 (two) times daily. After completion of starter pak 60 tablet 1    Objective: BP 127/79   Pulse (!) 102   Temp 99.9 F (37.7 C) (Oral)   Resp (!) 33   SpO2 92%  Exam: GEN: appears well, frequent shallow breaths Head: normocephalic and atraumatic  Eyes: conjunctiva without injection, sclera anicteric Oropharynx: mmm without erythema or exudation. Poor dentition HEM: negative for cervical or periauricular lymphadenopathies CVS: RRR, nl s1 & s2, no murmurs, no edema RESP: no IWOB, good air movement bilaterally, course crackles bilaterally, no wheeze GI: Limited exam due to body habitus, bowel sound present & normal, mildly distended, no tenderness to deep palpation. No rebound tenderness. Negative Murphy sign. No palpable organomegaly. GU: no suprapubic or CVA tenderness MSK: no focal tenderness or notable swelling SKIN: no apparent skin lesion NEURO: alert and oiented appropriately, no gross deficits except for mild chronic left-sided weakness, about 4/5 in both upper and lower extremities. PSYCH: euthymic mood with congruent affect  Labs and Imaging: CBC BMET   Recent Labs Lab 08/08/17 1618  WBC 5.2  HGB 10.6*  HCT 32.6*  PLT 256    Recent Labs Lab 08/08/17 1618  NA 138  K 4.1  CL 112*  CO2 19*  BUN 13  CREATININE 1.06  GLUCOSE 110*  CALCIUM 8.4*     Ct Abdomen Pelvis W Contrast  Result Date: 08/08/2017 CLINICAL DATA:  Shortness of breath, fever and chills EXAM:  CT ABDOMEN AND PELVIS WITH CONTRAST TECHNIQUE: Multidetector CT imaging of the abdomen and pelvis was performed using the standard protocol following bolus administration of intravenous contrast. CONTRAST:  ISOVUE-300 IOPAMIDOL (ISOVUE-300) INJECTION 61% COMPARISON:  None. FINDINGS: Lower chest: Extensive bilateral reticular opacities. No pleural effusion. Hepatobiliary: Normal hepatic contours and density. No visible biliary dilatation.  Normal gallbladder. Pancreas: Mild edema adjacent to the pancreatic uncinate process. No pancreatic ductal dilatation. No peripancreatic fluid collection. Spleen: Normal. Adrenals/Urinary Tract: --Adrenal glands: Normal. --Right kidney/ureter: No hydronephrosis or perinephric stranding. No nephrolithiasis. No obstructing ureteral stones. --Left kidney/ureter: No hydronephrosis or perinephric stranding. No nephrolithiasis. No obstructing ureteral stones. --Urinary bladder: Unremarkable. Stomach/Bowel: --Stomach/Duodenum: Normal --Small bowel: No dilatation or inflammation. --Colon: No focal abnormality. --Appendix: Normal. Vascular/Lymphatic: There is extensive aortic calcification. There is a partially thrombosed infrarenal abdominal aortic aneurysm that measures 4.2 x 3.9 cm. No abdominal or pelvic lymphadenopathy. Reproductive: Normal prostate and seminal vesicles. Musculoskeletal. No bony spinal canal stenosis or focal osseous abnormality. Other: None. IMPRESSION: 1. Mild edema surrounding the pancreatic head and uncinate process, compatible with acute pancreatitis in the appropriate context. No peripancreatic fluid collection or pancreatic ductal dilatation. 2. Partially thrombosed infrarenal abdominal aortic aneurysm, measuring up to 4.2 cm. Recommend followup by ultrasound in 1 year. This recommendation follows ACR consensus guidelines: White Paper of the ACR Incidental Findings Committee II on Vascular Findings. J Am Coll Radiol 2013; 10:789-794. Aortic aneurysm NOS  (ICD10-I71.9) 3.  Aortic Atherosclerosis (ICD10-I70.0). 4. Bibasilar interstitial lung disease. Electronically Signed   By: Deatra Robinson M.D.   On: 08/08/2017 21:48   US Abdomen Limited  Result Date: 08/08/2017 CLINICAL DATA:  Intermittent abdominal pain, onset this morning. EXAM: ULTRASOUND ABDOMEN LIMITED RIGHT UPPER QUADRANT COMPARISON:  None. FINDINGS: Gallbladder: There are nonshadowing nonmobile echogenic foci, likely representing polyps. These measure up to 8 mm. No shadowing calculi. No gallbladder mural thickening. No pericholecystic fluid. No tenderness to probe pressure over the gallbladder. Common bile duct: Diameter: Normal, 3.6 mm Liver: No focal lesion identified. Within normal limits in parenchymal echogenicity. Portal vein is patent on color Doppler imaging with normal direction of blood flow towards the liver. IMPRESSION: No sonographic evidence of cholecystitis. Probable gallbladder polyps measuring up to 8 mm. Normal bile ducts. Electronically Signed   By: Ellery Plunk M.D.   On: 08/08/2017 18:39   Dg Chest Portable 1 View  Result Date: 08/08/2017 CLINICAL DATA:  Cough EXAM: PORTABLE CHEST 1 VIEW COMPARISON:  02/27/2017 chest radiograph. FINDINGS: Stable configuration of sternotomy wires noting multiple discontinuities in the upper 3 sternotomy wires. CABG clips overlie the mediastinum. Stable cardiomediastinal silhouette with mild cardiomegaly. No pneumothorax. No pleural effusion. Patchy reticular opacities and mild distortion throughout both lungs, questionably mildly worsened in the interval. No acute superimposed consolidative airspace disease. IMPRESSION: Stable mild cardiomegaly. Apparent mild worsening of patchy reticular opacities throughout both lungs, which could represent progression of the interstitial lung disease described on 04/05/2017 high-resolution chest CT study versus mild pulmonary edema superimposed on stable chronic interstitial lung disease. Electronically  Signed   By: Delbert Phenix M.D.   On: 08/08/2017 16:42    Almon Hercules, MD 08/08/2017, 9:33 PM PGY-3, [facility] FPTS Intern pager: [phone], text pages welcome

## 2017-08-08 NOTE — ED Provider Notes (Signed)
[facility]-EMERGENCY DEPT Provider Note   CSN: [id number] Arrival date & time: 08/08/17  1604     History   Chief Complaint Chief Complaint  Patient presents with  . Shortness of Breath    HPI [name] is a 61 y.o. male.  61 year old male history of CAD S/P CABG, AAA, ILD on baseline 2 L nasal cannula, HTN, CHF who presents with fever, cough, and abdominal pain.  Febrile to 101.4 by EMS, tachycardic 130s on arrival.  Code sepsis initiated (IVF considered but 30cc/kg not given due to CHF history and concern for pulmonary edema).  Patient endorses 5 days of fevers.  He was evaluated several days ago by vascular surgery and underwent extensive reevaluation of known AAA (Was reportedly 4.5 cm).  He endorses chronic cough productive of yellow sputum, no acute changes.  He called EMS today due to generalized abdominal pain.  He was concerned about his AAA. Abdominal pain resolved prior to arrival. Denies N/V/D.   The history is provided by the patient, the EMS personnel and medical records. No language interpreter was used.    Past Medical History:  Diagnosis Date  . AAA (abdominal aortic aneurysm) (HCC)   . CAD in native artery    a. h/o MI x 3 s/p CABG x 4 (1993 with Tyrone Sage). b. LHC 01/2017 with severe native disease, 2 grafts occluded.  . Carotid artery disease (HCC)    a. s/p R CEA 11/2016.  [name] Chronic combined systolic and diastolic CHF (congestive heart failure) (HCC)   . ILD (interstitial lung disease) (HCC)   . Lung nodule   . MI (myocardial infarction) (HCC)    x 3   . Normocytic anemia   . NSVT (nonsustained ventricular tachycardia) (HCC)   . RA (rheumatoid arthritis) (HCC)   . Stroke (HCC)   . Tobacco abuse     Patient Active Problem List   Diagnosis Date Noted  . Elevated lipase   . Elevated liver enzymes   . Acute pancreatitis 08/08/2017  . Carotid artery disease (HCC) 05/12/2017  . 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 Chronic  combined systolic and diastolic CHF (congestive heart failure) (HCC) 03/07/2017  . S/P CABG (coronary artery bypass graft)   . Pure hypercholesterolemia   . Acute combined systolic and diastolic heart failure (HCC)   . Exertional shortness of breath   . Solitary pulmonary nodule   . CAD (coronary artery disease) 02/24/2017  . ILD (interstitial lung disease) (HCC) 02/24/2017  . Normocytic anemia 02/24/2017  . AAA (abdominal aortic aneurysm) (HCC) 02/24/2017  . Abnormal EKG 02/24/2017  . Acute respiratory failure with hypoxia (HCC) 02/24/2017  . History of stroke 02/24/2017  . Essential hypertension 02/24/2017  . Current smoker 02/24/2017    Past Surgical History:  Procedure Laterality Date  . biopsy     Throat   . CARDIAC CATHETERIZATION    . CAROTID ENDARTERECTOMY Left   . LEFT HEART CATH AND CORS/GRAFTS ANGIOGRAPHY N/A 03/01/2017   Procedure: Left Heart Cath and Cors/Grafts Angiography;  Surgeon: Lyn Records, MD;  Location: [facility] INVASIVE CV LAB;  Service: Cardiovascular;  Laterality: N/A;       Home Medications    Prior to Admission medications   Medication Sig Start Date End Date Taking? Authorizing Provider  acetaminophen (TYLENOL) 500 MG tablet Take 1,000 mg by mouth every 6 (six) hours as needed for mild pain.    Yes [provider]  albuterol (PROVENTIL HFA;VENTOLIN HFA) 108 (90 Base)  MCG/ACT inhaler Inhale 2 puffs into the lungs every 6 (six) hours as needed for wheezing or shortness of breath. 04/02/17  Yes Kalman Shan, MD  aspirin 81 MG chewable tablet Chew 1 tablet (81 mg total) by mouth daily. 03/03/17  Yes Leroy Sea, MD  atorvastatin (LIPITOR) 40 MG tablet Take 1 tablet (40 mg total) by mouth daily at 6 PM. 03/15/17  Yes Dunn, Tacey Ruiz, PA-C  azaTHIOprine (IMURAN) 50 MG tablet Take 150 mg by mouth daily.   Yes [provider]  carvedilol (COREG) 12.5 MG tablet Take 1 tablet (12.5 mg total) by mouth 2 (two) times daily. 03/15/17 08/09/17 Yes  Dunn, Dayna N, PA-C  gabapentin (NEURONTIN) 300 MG capsule Take 1 capsule (300 mg total) by mouth every 8 (eight) hours. 06/24/17  Yes Edward Jolly, MD  HYDROcodone-acetaminophen (NORCO/VICODIN) 5-325 MG tablet Take 1 tablet by mouth 2 (two) times daily as needed for moderate pain. 07/15/17 08/15/17 Yes Edward Jolly, MD  lisinopril (PRINIVIL,ZESTRIL) 20 MG tablet Take 1 tablet (20 mg total) by mouth daily. Patient taking differently: Take 20 mg by mouth 2 (two) times daily.  06/24/17 09/22/17 Yes Dunn, Dayna N, PA-C  losartan (COZAAR) 25 MG tablet Take 25 mg by mouth daily. 07/21/17  Yes [provider]  meloxicam (MOBIC) 7.5 MG tablet Take 1 tablet (7.5 mg total) by mouth 2 (two) times daily. 05/26/17  Yes Edward Jolly, MD  Menthol-Methyl Salicylate (MUSCLE RUB EX) Apply 1 application topically as needed (for shoulder or knee pain).    Yes [provider]  OXYGEN Inhale 2 L into the lungs at bedtime.   Yes [provider]  predniSONE (DELTASONE) 5 MG tablet Take 2 tablets (10mg ) twice daily. 05/04/17  Yes [provider]  spironolactone (ALDACTONE) 25 MG tablet Take 1 tablet (25 mg total) by mouth daily. 03/15/17  Yes Dunn, Tacey Ruiz, PA-C    Family History Family History  Problem Relation Age of Onset  . Heart murmur Mother   . Suicidality Father   . Stroke Brother   . Heart attack Maternal Grandmother     Social History Social History  Substance Use Topics  . Smoking status: Current Some Day Smoker    Packs/day: 0.01    Years: 47.00    Types: Cigarettes  . Smokeless tobacco: Never Used     Comment: 1 cigarette a day 8.21.2018 ee  . Alcohol use No     Allergies   Patient has no known allergies.   Review of Systems Review of Systems  Constitutional: Positive for fatigue and fever. Negative for chills.  HENT: Negative for ear pain and sore throat.   Eyes: Negative for pain and visual disturbance.  Respiratory: Positive for cough. Negative for  shortness of breath.   Cardiovascular: Negative for chest pain and palpitations.  Gastrointestinal: Positive for abdominal pain. Negative for vomiting.  Genitourinary: Negative for dysuria and hematuria.  Musculoskeletal: Negative for arthralgias and back pain.  Skin: Negative for color change and rash.  Neurological: Negative for seizures and syncope.  All other systems reviewed and are negative.    Physical Exam Updated Vital Signs BP 121/79   Pulse 81   Temp 98 F (36.7 C) (Oral)   Resp 20   Wt 98.9 kg (218 lb)   SpO2 100%   BMI 29.77 kg/m   Physical Exam  Constitutional: He appears well-developed. No distress.  HENT:  Head: Normocephalic and atraumatic.  Eyes: Conjunctivae are normal.  Neck: Neck supple.  Cardiovascular: Regular rhythm.  Tachycardia present.   No murmur heard. Pulmonary/Chest: Effort normal and breath sounds normal. No respiratory distress.  Abdominal: Soft. He exhibits no distension. There is no tenderness. There is no guarding.  Musculoskeletal: He exhibits no edema.  Neurological: He is alert. No cranial nerve deficit. Coordination normal.  5/5 motor strength and intact sensation in all extremities. Intact bilateral finger-to-nose coordination  Skin: Skin is warm and dry.  Nursing note and vitals reviewed.    ED Treatments / Results  Labs (all labs ordered are listed, but only abnormal results are displayed) Labs Reviewed  COMPREHENSIVE METABOLIC PANEL - Abnormal; Notable for the following:       Result Value   Chloride 112 (*)    CO2 19 (*)    Glucose, Bld 110 (*)    Calcium 8.4 (*)    Albumin 2.5 (*)    AST 108 (*)    Alkaline Phosphatase 657 (*)    All other components within normal limits  CBC WITH DIFFERENTIAL/PLATELET - Abnormal; Notable for the following:    RBC 3.66 (*)    Hemoglobin 10.6 (*)    HCT 32.6 (*)    RDW 15.6 (*)    Lymphs Abs 0.4 (*)    All other components within normal limits  URINALYSIS, ROUTINE W REFLEX  MICROSCOPIC - Abnormal; Notable for the following:    Color, Urine AMBER (*)    Protein, ur 100 (*)    Bacteria, UA RARE (*)    Squamous Epithelial / LPF 0-5 (*)    All other components within normal limits  LIPASE, BLOOD - Abnormal; Notable for the following:    Lipase 949 (*)    All other components within normal limits  GAMMA GT - Abnormal; Notable for the following:    GGT 285 (*)    All other components within normal limits  COMPREHENSIVE METABOLIC PANEL - Abnormal; Notable for the following:    CO2 20 (*)    Glucose, Bld 129 (*)    Calcium 8.4 (*)    Total Protein 6.1 (*)    Albumin 2.3 (*)    AST 63 (*)    Alkaline Phosphatase 534 (*)    All other components within normal limits  CBC - Abnormal; Notable for the following:    RBC 3.31 (*)    Hemoglobin 9.3 (*)    HCT 29.8 (*)    RDW 15.7 (*)    All other components within normal limits  LIPASE, BLOOD - Abnormal; Notable for the following:    Lipase 249 (*)    All other components within normal limits  CULTURE, BLOOD (ROUTINE X 2)  CULTURE, BLOOD (ROUTINE X 2)  URINE CULTURE  I-STAT CG4 LACTIC ACID, ED    EKG  EKG Interpretation  Date/Time:  Sunday August 08 2017 16:16:38 EDT Ventricular Rate:  132 PR Interval:    QRS Duration: 110 QT Interval:  310 QTC Calculation: 460 R Axis:   21 Text Interpretation:  Sinus tachycardia Repol abnrm suggests ischemia, anterolateral When compared to prior, faster rate. Similar T wave inversions in leads 1, AVL, V5-V6.  No STEMI Confirmed by Theda Belfast ([id number]) on 08/08/2017 4:29:09 PM       Radiology Ct Abdomen Pelvis W Contrast  Result Date: 08/08/2017 CLINICAL DATA:  Shortness of breath, fever and chills EXAM: CT ABDOMEN AND PELVIS WITH CONTRAST TECHNIQUE: Multidetector CT imaging of the abdomen and pelvis was performed using the standard protocol following bolus administration of  intravenous contrast. CONTRAST:  ISOVUE-300 IOPAMIDOL (ISOVUE-300) INJECTION 61%  COMPARISON:  None. FINDINGS: Lower chest: Extensive bilateral reticular opacities. No pleural effusion. Hepatobiliary: Normal hepatic contours and density. No visible biliary dilatation. Normal gallbladder. Pancreas: Mild edema adjacent to the pancreatic uncinate process. No pancreatic ductal dilatation. No peripancreatic fluid collection. Spleen: Normal. Adrenals/Urinary Tract: --Adrenal glands: Normal. --Right kidney/ureter: No hydronephrosis or perinephric stranding. No nephrolithiasis. No obstructing ureteral stones. --Left kidney/ureter: No hydronephrosis or perinephric stranding. No nephrolithiasis. No obstructing ureteral stones. --Urinary bladder: Unremarkable. Stomach/Bowel: --Stomach/Duodenum: Normal --Small bowel: No dilatation or inflammation. --Colon: No focal abnormality. --Appendix: Normal. Vascular/Lymphatic: There is extensive aortic calcification. There is a partially thrombosed infrarenal abdominal aortic aneurysm that measures 4.2 x 3.9 cm. No abdominal or pelvic lymphadenopathy. Reproductive: Normal prostate and seminal vesicles. Musculoskeletal. No bony spinal canal stenosis or focal osseous abnormality. Other: None. IMPRESSION: 1. Mild edema surrounding the pancreatic head and uncinate process, compatible with acute pancreatitis in the appropriate context. No peripancreatic fluid collection or pancreatic ductal dilatation. 2. Partially thrombosed infrarenal abdominal aortic aneurysm, measuring up to 4.2 cm. Recommend followup by ultrasound in 1 year. This recommendation follows ACR consensus guidelines: White Paper of the ACR Incidental Findings Committee II on Vascular Findings. J Am Coll Radiol 2013; 10:789-794. Aortic aneurysm NOS (ICD10-I71.9) 3.  Aortic Atherosclerosis (ICD10-I70.0). 4. Bibasilar interstitial lung disease. Electronically Signed   By: Deatra Robinson M.D.   On: 08/08/2017 21:48   US Abdomen Limited  Result Date: 08/08/2017 CLINICAL DATA:  Intermittent abdominal pain,  onset this morning. EXAM: ULTRASOUND ABDOMEN LIMITED RIGHT UPPER QUADRANT COMPARISON:  None. FINDINGS: Gallbladder: There are nonshadowing nonmobile echogenic foci, likely representing polyps. These measure up to 8 mm. No shadowing calculi. No gallbladder mural thickening. No pericholecystic fluid. No tenderness to probe pressure over the gallbladder. Common bile duct: Diameter: Normal, 3.6 mm Liver: No focal lesion identified. Within normal limits in parenchymal echogenicity. Portal vein is patent on color Doppler imaging with normal direction of blood flow towards the liver. IMPRESSION: No sonographic evidence of cholecystitis. Probable gallbladder polyps measuring up to 8 mm. Normal bile ducts. Electronically Signed   By: Ellery Plunk M.D.   On: 08/08/2017 18:39   Dg Chest Portable 1 View  Result Date: 08/08/2017 CLINICAL DATA:  Cough EXAM: PORTABLE CHEST 1 VIEW COMPARISON:  02/27/2017 chest radiograph. FINDINGS: Stable configuration of sternotomy wires noting multiple discontinuities in the upper 3 sternotomy wires. CABG clips overlie the mediastinum. Stable cardiomediastinal silhouette with mild cardiomegaly. No pneumothorax. No pleural effusion. Patchy reticular opacities and mild distortion throughout both lungs, questionably mildly worsened in the interval. No acute superimposed consolidative airspace disease. IMPRESSION: Stable mild cardiomegaly. Apparent mild worsening of patchy reticular opacities throughout both lungs, which could represent progression of the interstitial lung disease described on 04/05/2017 high-resolution chest CT study versus mild pulmonary edema superimposed on stable chronic interstitial lung disease. Electronically Signed   By: Delbert Phenix M.D.   On: 08/08/2017 16:42    Procedures Procedures (including critical care time)  Medications Ordered in ED Medications  enoxaparin (LOVENOX) injection 40 mg (40 mg Subcutaneous Given 08/09/17 1130)  HYDROmorphone (DILAUDID)  injection 1 mg (1 mg Intravenous Given 08/09/17 1622)  gabapentin (NEURONTIN) capsule 300 mg (300 mg Oral Given 08/09/17 1440)  atorvastatin (LIPITOR) tablet 40 mg (not administered)  aspirin chewable tablet 81 mg (81 mg Oral Given 08/09/17 1045)  carvedilol (COREG) tablet 3.125 mg (3.125 mg Oral Given 08/09/17 1622)  dextrose 5 %-0.9 % sodium chloride infusion (  Intravenous Rate/Dose Verify 08/09/17 1000)  piperacillin-tazobactam (ZOSYN) IVPB 3.375 g (0 g Intravenous Stopped 08/09/17 1558)  oxyCODONE (Oxy IR/ROXICODONE) immediate release tablet 5 mg (5 mg Oral Given 08/09/17 1448)  polyethylene glycol (MIRALAX / GLYCOLAX) packet 17 g (17 g Oral Given 08/09/17 1131)  cefTRIAXone (ROCEPHIN) 1 g in dextrose 5 % 50 mL IVPB (0 g Intravenous Stopped 08/08/17 1845)  azithromycin (ZITHROMAX) 500 mg in dextrose 5 % 250 mL IVPB (0 mg Intravenous Stopped 08/08/17 2026)  lactated ringers bolus 500 mL (0 mLs Intravenous Stopped 08/08/17 1910)  piperacillin-tazobactam (ZOSYN) IVPB 3.375 g (0 g Intravenous Stopped 08/08/17 2242)  iopamidol (ISOVUE-300) 61 % injection (100 mLs  Contrast Given 08/08/17 2101)  ipratropium-albuterol (DUONEB) 0.5-2.5 (3) MG/3ML nebulizer solution 3 mL (3 mLs Nebulization Given 08/08/17 2251)  ipratropium-albuterol (DUONEB) 0.5-2.5 (3) MG/3ML nebulizer solution 3 mL (3 mLs Nebulization Given 08/09/17 1141)  acetaminophen (TYLENOL) tablet 500 mg (500 mg Oral Given 08/09/17 1129)     Initial Impression / Assessment and Plan / ED Course  I have reviewed the triage vital signs and the nursing notes.  Pertinent labs & imaging results that were available during my care of the patient were reviewed by me and considered in my medical decision making (see chart for details).     27 yoM h/o CAD S/P CABG, AAA, ILD on baseline 2 L nasal cannula, HTN, CHF who presents with fever, cough, and abdominal pain. Sepsis labs ordered (IVF considered but 30cc/kg not given due to CHF history and concern for  pulmonary edema). CXR showing patchy bilateral opacities. Rocephin/azithromycin given empirically for CAP. Temp 99.9 on arrival, tachycardic 120s, otherwise VSS. Lungs diffusely coarse. On baseline 2L [place]. Abdomen diffusely soft and nontender. BSUS performed showing maximal AAA dimension of 4.5cm.  EKG showing sinus tachycardia. Labs significant for elevated liver enzymes from baseline (AST 108, ALT 61, ALP 657, T bili 1.2). Lipase 949. RUQ U/S showing no sonography evidence of cholecystitis and gallbladder polyps.   Evidence of pancreatitis, possibly from biliary source. Cholangitis within differential, though pt clinically well appearing. Pt admitted for further management and evaluation. Pt stable at time of transfer.  Pt care d/w Dr. Rush Landmark  Final Clinical Impressions(s) / ED Diagnoses   Final diagnoses:  Elevated liver enzymes  Elevated lipase    New Prescriptions Current Discharge Medication List        Hebert Soho, MD 08/09/17 1720    Tegeler, Canary Brim, MD 08/10/17 [phone]

## 2017-08-09 DIAGNOSIS — R748 Abnormal levels of other serum enzymes: Secondary | ICD-10-CM | POA: Insufficient documentation

## 2017-08-09 DIAGNOSIS — I25810 Atherosclerosis of coronary artery bypass graft(s) without angina pectoris: Secondary | ICD-10-CM

## 2017-08-09 LAB — CBC
HCT: 29.8 % — ABNORMAL LOW (ref 39.0–52.0)
Hemoglobin: 9.3 g/dL — ABNORMAL LOW (ref 13.0–17.0)
MCH: 28.1 pg (ref 26.0–34.0)
MCHC: 31.2 g/dL (ref 30.0–36.0)
MCV: 90 fL (ref 78.0–100.0)
Platelets: 285 10*3/uL (ref 150–400)
RBC: 3.31 MIL/uL — ABNORMAL LOW (ref 4.22–5.81)
RDW: 15.7 % — ABNORMAL HIGH (ref 11.5–15.5)
WBC: 5.2 10*3/uL (ref 4.0–10.5)

## 2017-08-09 LAB — COMPREHENSIVE METABOLIC PANEL
ALT: 52 U/L (ref 17–63)
AST: 63 U/L — ABNORMAL HIGH (ref 15–41)
Albumin: 2.3 g/dL — ABNORMAL LOW (ref 3.5–5.0)
Alkaline Phosphatase: 534 U/L — ABNORMAL HIGH (ref 38–126)
Anion gap: 10 (ref 5–15)
BUN: 11 mg/dL (ref 6–20)
CO2: 20 mmol/L — ABNORMAL LOW (ref 22–32)
Calcium: 8.4 mg/dL — ABNORMAL LOW (ref 8.9–10.3)
Chloride: 107 mmol/L (ref 101–111)
Creatinine, Ser: 0.99 mg/dL (ref 0.61–1.24)
GFR calc Af Amer: >60 mL/min (ref >60)
GFR calc non Af Amer: >60 mL/min (ref >60)
Glucose, Bld: 129 mg/dL — ABNORMAL HIGH (ref 65–99)
Potassium: 3.8 mmol/L (ref 3.5–5.1)
Sodium: 137 mmol/L (ref 135–145)
Total Bilirubin: 0.9 mg/dL (ref 0.3–1.2)
Total Protein: 6.1 g/dL — ABNORMAL LOW (ref 6.5–8.1)

## 2017-08-09 LAB — URINE CULTURE: Culture: NO GROWTH

## 2017-08-09 LAB — GAMMA GT: GGT: 285 U/L — AB (ref 7–50)

## 2017-08-09 LAB — LIPASE, BLOOD: Lipase: 249 U/L — ABNORMAL HIGH (ref 11–51)

## 2017-08-09 MED ORDER — POLYETHYLENE GLYCOL 3350 17 G PO PACK
17.0000 g | PACK | Freq: Every day | ORAL | Status: DC
Start: 2017-08-09 — End: 2017-08-11
  Administered 2017-08-09 – 2017-08-11 (×3): 17 g via ORAL
  Filled 2017-08-09 (×3): qty 1

## 2017-08-09 MED ORDER — IPRATROPIUM-ALBUTEROL 0.5-2.5 (3) MG/3ML IN SOLN
3.0000 mL | Freq: Once | RESPIRATORY_TRACT | Status: AC | PRN
  Administered 2017-08-09: 3 mL via RESPIRATORY_TRACT
  Filled 2017-08-09: qty 3

## 2017-08-09 MED ORDER — PIPERACILLIN-TAZOBACTAM 3.375 G IVPB
3.3750 g | Freq: Three times a day (TID) | INTRAVENOUS | Status: DC
  Administered 2017-08-09 – 2017-08-10 (×3): 3.375 g via INTRAVENOUS
  Filled 2017-08-09 (×5): qty 50

## 2017-08-09 MED ORDER — IPRATROPIUM-ALBUTEROL 0.5-2.5 (3) MG/3ML IN SOLN
3.0000 mL | Freq: Four times a day (QID) | RESPIRATORY_TRACT | Status: DC | PRN
  Administered 2017-08-10 (×2): 3 mL via RESPIRATORY_TRACT
  Filled 2017-08-09 (×2): qty 3

## 2017-08-09 MED ORDER — DEXTROSE-NACL 5-0.9 % IV SOLN
INTRAVENOUS | Status: DC
  Administered 2017-08-09 (×2): via INTRAVENOUS

## 2017-08-09 MED ORDER — DEXTROSE-NACL 5-0.9 % IV SOLN: INTRAVENOUS | Status: DC

## 2017-08-09 MED ORDER — ASPIRIN 81 MG PO CHEW
81.0000 mg | CHEWABLE_TABLET | Freq: Every day | ORAL | Status: DC
  Administered 2017-08-09 – 2017-08-11 (×3): 81 mg via ORAL
  Filled 2017-08-09 (×3): qty 1

## 2017-08-09 MED ORDER — CARVEDILOL 3.125 MG PO TABS
3.1250 mg | ORAL_TABLET | Freq: Two times a day (BID) | ORAL | Status: DC
Start: 2017-08-09 — End: 2017-08-11
  Administered 2017-08-09 – 2017-08-11 (×5): 3.125 mg via ORAL
  Filled 2017-08-09 (×5): qty 1

## 2017-08-09 MED ORDER — GABAPENTIN 300 MG PO CAPS
300.0000 mg | ORAL_CAPSULE | Freq: Three times a day (TID) | ORAL | Status: DC
  Administered 2017-08-09 – 2017-08-11 (×9): 300 mg via ORAL
  Filled 2017-08-09 (×9): qty 1

## 2017-08-09 MED ORDER — ACETAMINOPHEN 500 MG PO TABS
500.0000 mg | ORAL_TABLET | Freq: Once | ORAL | Status: AC | PRN
  Administered 2017-08-09: 500 mg via ORAL
  Filled 2017-08-09: qty 1

## 2017-08-09 MED ORDER — ENOXAPARIN SODIUM 40 MG/0.4ML ~~LOC~~ SOLN
40.0000 mg | SUBCUTANEOUS | Status: DC
  Administered 2017-08-09 – 2017-08-11 (×3): 40 mg via SUBCUTANEOUS
  Filled 2017-08-09 (×3): qty 0.4

## 2017-08-09 MED ORDER — HYDROMORPHONE HCL 1 MG/ML IJ SOLN
1.0000 mg | INTRAMUSCULAR | Status: DC | PRN
  Administered 2017-08-09 (×3): 1 mg via INTRAVENOUS
  Filled 2017-08-09 (×3): qty 1

## 2017-08-09 MED ORDER — ACETAMINOPHEN 325 MG PO TABS
325.0000 mg | ORAL_TABLET | Freq: Four times a day (QID) | ORAL | Status: DC | PRN
  Administered 2017-08-10 – 2017-08-11 (×3): 325 mg via ORAL
  Filled 2017-08-09 (×3): qty 1

## 2017-08-09 MED ORDER — ATORVASTATIN CALCIUM 40 MG PO TABS
40.0000 mg | ORAL_TABLET | Freq: Every day | ORAL | Status: DC
  Administered 2017-08-09 – 2017-08-10 (×2): 40 mg via ORAL
  Filled 2017-08-09 (×2): qty 1

## 2017-08-09 MED ORDER — OXYCODONE HCL 5 MG PO TABS
5.0000 mg | ORAL_TABLET | Freq: Four times a day (QID) | ORAL | Status: DC | PRN
  Administered 2017-08-09 – 2017-08-11 (×5): 5 mg via ORAL
  Filled 2017-08-09 (×5): qty 1

## 2017-08-09 NOTE — Progress Notes (Addendum)
Pt requested tylenol for headache, stated that tylenol is the only medication that will cure the headache and oxycodone or dilaudid do not help.  Pt also is having increased work of breathing with accessory muscle involvement, RR 30, O2 sats 95-98 on 2L [place]. Patient requested nebulizer treatment, stating that he uses inhalers at home and the previous nebulizer treatment made his breathing much easier.   MD notified, awaiting orders for tylenol and PRN breathing treatments. Will continue to monitor the patient.  MD prescribed one time PRN tylenol and one dose STAT duoneb, given at 1141. Breath sounds improved

## 2017-08-09 NOTE — Progress Notes (Signed)
Family Medicine Teaching Service Daily Progress Note Intern Pager: [phone]  Patient name: [name] Medical record number: [id number] Date of birth: 11-09-1955 Age: 61 y.o. Gender: male  Primary Care Provider: [name], PA-C Consultants: none Code Status: full   Pt Overview and Major Events to Date:  --Admitted on 10/7 for pancreatitis  Assessment and Plan:  [name] is a 61 y.o. Male with a PMH CAD, post CABG, AAA, right CEA, PAD, combined sys/diastolic CHF, CVA w/left sided motor deficit, ILD, lung nodule, tobacco use and normocytic anemia who presented 10/7 for evaluation of abdominal pain and fever.   Acute Pancreatitis, improving Presented with new onset abdominal pain, fever. CT abdomen consistent with acute pancreatitis. No pancreatic ductal dilation nor necrosis.  Overnight, Lipase 949>>249 with improving LFTs. Alk 657>> 534. Pain is well controlled. Differential alcohol vs infectious vs medication (sulfasalazine). -Clear liquid diet currently. Will advance his diet today as patient's pain is decreasing  -Hold home sulfasalazine d/t association with pancreatitis  -Continue Zosyn -Discontinue Dilaudid 1 mg -Transition patient to Oxycodone 22m q6h prn -Start Miralax 17g daily  -Monitor vital signs per floor protocol  Concern for sepsis, resolved   Patient with fever to 101.4 prior to arrival to ED. Normal vitals. His qSOFA is 0, resolved.  -Continue Zosyn -Follow-up on blood cultures  CAD s/p CABG, chronic Stable. [place] of [place] 3 [place] 2 occluded grafts on LHC in 01/2017. Denies chest pain. Reports baseline dyspnea due to interstitial lung disease. On 10/8,EKG shows T wave abnormality concerning for ischemia.  -Continue home atorvastatin and aspirin -Increase Coreg to 6.25 mg twice a day. Gradually increase back to home dose.   AAA/right CEA in 11/2016/PAD, chronic, stable.  He was told that his  AAA has increased to 4.5 cm, and will continue to follow until 5cm. AAA 4.2 cm on CT abdomen 10/7. -Outpatient follow-up -Atorvastatin and aspirin as above   Combined systolic/diastolic HF, chronic Stable. Last echo on 02/26/2017 with EF of 35-40%, diffuse hypokinesis, some akinesis, G1 DD, mod-dilated and PAPP of 42 mmHg. No signs of fluid overload on admission. Dyspnea at baseline with ILD. Patient continues to smoke 1/2-1 ppd.  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 Stable. with  left-sided weakness. Had right CEA.  -Atorvastatin and aspirin as above  History of rheumatoid arthritis On sulfasalazine at home. -Hold home sulfasalazine in the setting of acute pancreatitis  History of solitary pulmonary nodule A 7 mm right upper lobe pulmonary nodule noted on CTA on 02/25/2017 Follow-up CT recommended in 18-24 months. He is followed by pulmonology Dr.Ramaswany. -Outpatient follow-up  History of interstitial lung disease Noted on high resolution CT on 04/05/2017. Followed by pulmonology. It appears he has a repeat high-resolution CT scheduled for 08/17/2017. -Touch base with his pulmonologist if he wants to get this CT scan this admission  Tobacco use Smokes about a pack a day since he is 61years of age. CAD down to half a pack every week in 11/2016 when he had MI. On Chantix outpatient -Offer nicotine patch  Normocytic anemia Hemoglobin 9.3 today (slighly below baseline). MCV 90. Recent anemia panel on 02/25/2017 with iron saturation ratio 5%.  -Repeat anemia panel today 10/8  FEN/GI: ADAT, Heart healthy Prophylaxis: Lovenox  Disposition: Pending clinical improvement  Subjective:  Today the patient reports that he feels improved from yesterday. Patient  reports mild lower abdominal pain 5/10 in severity this morning, but 0/10 in severity after 1 dose of diladud. He states associated mid to  lower back pain with this. The patient also reports a mild headache, but states it is similar to the headaches he gets on a regular basis. The patient states he has been able to drink clear liquids without difficulty, but has not tried to eat soup or jello. He has been able to void, but not BM today. Patient states he is able to ambulate without pain.   Objective: Temp:  [98.5 F (36.9 C)-99.9 F (37.7 C)] 98.9 F (37.2 C) (10/08 0541) Pulse Rate:  [95-134] 96 (10/08 0812) Resp:  [18-38] 18 (10/08 0541) BP: (95-130)/(59-79) 111/71 (10/08 0812) SpO2:  [89 %-98 %] 95 % (10/08 0541) Weight:  [218 lb (98.9 kg)] 218 lb (98.9 kg) (10/08 0123) Physical Exam: General: Alert and oriented. No acute distress. 2L O2 by [place] Eyes: Conjunctiva without injection or jaundice. Pupils equal and reactive to light.  Cardiovascular: Regular rate and rhythm.  Respiratory: Diffuse dry crackles throughout lung fields. Coughs with deep breathing.  Abdomen: Mildly distended and tight. No tenderness to deep palpation of abdomen. Negative murphy sign. BS present. No ascites  Extremities: No LE edema or tenderness to palpation   Laboratory:  Recent Labs Lab 08/08/17 1618 08/09/17 0308  WBC 5.2 5.2  HGB 10.6* 9.3*  HCT 32.6* 29.8*  PLT 256 285    Recent Labs Lab 08/08/17 1618 08/09/17 0308  NA 138 137  K 4.1 3.8  CL 112* 107  CO2 19* 20*  BUN 13 11  CREATININE 1.06 0.99  CALCIUM 8.4* 8.4*  PROT 6.5 6.1*  BILITOT 1.2 0.9  ALKPHOS 657* 534*  ALT 61 52  AST 108* 63*  GLUCOSE 110* 129*     Imaging/Diagnostic Tests: Ct Abdomen Pelvis W Contrast  Result Date: 08/08/2017 CLINICAL DATA:  Shortness of breath, fever and chills EXAM: CT ABDOMEN AND PELVIS WITH CONTRAST TECHNIQUE: Multidetector CT imaging of the abdomen and pelvis was performed using the standard protocol following bolus administration of intravenous contrast. CONTRAST:  149m ISOVUE-300 IOPAMIDOL (ISOVUE-300) INJECTION 61% COMPARISON:   None. FINDINGS: Lower chest: Extensive bilateral reticular opacities. No pleural effusion. Hepatobiliary: Normal hepatic contours and density. No visible biliary dilatation. Normal gallbladder. Pancreas: Mild edema adjacent to the pancreatic uncinate process. No pancreatic ductal dilatation. No peripancreatic fluid collection. Spleen: Normal. Adrenals/Urinary Tract: --Adrenal glands: Normal. --Right kidney/ureter: No hydronephrosis or perinephric stranding. No nephrolithiasis. No obstructing ureteral stones. --Left kidney/ureter: No hydronephrosis or perinephric stranding. No nephrolithiasis. No obstructing ureteral stones. --Urinary bladder: Unremarkable. Stomach/Bowel: --Stomach/Duodenum: Normal --Small bowel: No dilatation or inflammation. --Colon: No focal abnormality. --Appendix: Normal. Vascular/Lymphatic: There is extensive aortic calcification. There is a partially thrombosed infrarenal abdominal aortic aneurysm that measures 4.2 x 3.9 cm. No abdominal or pelvic lymphadenopathy. Reproductive: Normal prostate and seminal vesicles. Musculoskeletal. No bony spinal canal stenosis or focal osseous abnormality. Other: None. IMPRESSION: 1. Mild edema surrounding the pancreatic head and uncinate process, compatible with acute pancreatitis in the appropriate context. No peripancreatic fluid collection or pancreatic ductal dilatation. 2. Partially thrombosed infrarenal abdominal aortic aneurysm, measuring up to 4.2 cm. Recommend followup by ultrasound in 1 year. This recommendation follows ACR consensus guidelines: White Paper of the ACR Incidental Findings Committee II on Vascular Findings. J Am Coll Radiol 2013; 10:789-794. Aortic aneurysm NOS (ICD10-I71.9) 3.  Aortic Atherosclerosis (ICD10-I70.0). 4. Bibasilar interstitial lung disease. Electronically Signed   By: KUlyses Jarred  M.D.   On: 08/08/2017 21:48   US Abdomen Limited  Result Date: 08/08/2017 CLINICAL DATA:  Intermittent abdominal pain, onset this  morning. EXAM: ULTRASOUND ABDOMEN LIMITED RIGHT UPPER QUADRANT COMPARISON:  None. FINDINGS: Gallbladder: There are nonshadowing nonmobile echogenic foci, likely representing polyps. These measure up to 8 mm. No shadowing calculi. No gallbladder mural thickening. No pericholecystic fluid. No tenderness to probe pressure over the gallbladder. Common bile duct: Diameter: Normal, 3.6 mm Liver: No focal lesion identified. Within normal limits in parenchymal echogenicity. Portal vein is patent on color Doppler imaging with normal direction of blood flow towards the liver. IMPRESSION: No sonographic evidence of cholecystitis. Probable gallbladder polyps measuring up to 8 mm. Normal bile ducts. Electronically Signed   By: Andreas Newport M.D.   On: 08/08/2017 18:39   Dg Chest Portable 1 View  Result Date: 08/08/2017 CLINICAL DATA:  Cough EXAM: PORTABLE CHEST 1 VIEW COMPARISON:  02/27/2017 chest radiograph. FINDINGS: Stable configuration of sternotomy wires noting multiple discontinuities in the upper 3 sternotomy wires. CABG clips overlie the mediastinum. Stable cardiomediastinal silhouette with mild cardiomegaly. No pneumothorax. No pleural effusion. Patchy reticular opacities and mild distortion throughout both lungs, questionably mildly worsened in the interval. No acute superimposed consolidative airspace disease. IMPRESSION: Stable mild cardiomegaly. Apparent mild worsening of patchy reticular opacities throughout both lungs, which could represent progression of the interstitial lung disease described on 04/05/2017 high-resolution chest CT study versus mild pulmonary edema superimposed on stable chronic interstitial lung disease. Electronically Signed   By: Ilona Sorrel M.D.   On: 08/08/2017 16:42   I have seen and evaluated the patient with Medical Student . I am in agreement with the note above in its revised form.  Marjie Skiff, MD Family Medicine, PGY-2  Alisa Graff, Medical  Student 08/09/2017, 8:33 AM [place] Intern pager: [phone], text pages welcome

## 2017-08-09 NOTE — ED Notes (Signed)
Report given  To rn on 2w christy

## 2017-08-09 NOTE — Progress Notes (Signed)
New Admission Note:  Arrival Method: By bed from ED around 0040 Mental Orientation: Alert and oriented Telemetry: [address], CCMD notified Assessment: Completed Skin: Completed, refer to flowsheets IV: Right and Left AC Pain: Denies Tubes: None Safety Measures: Safety Fall Prevention Plan was given, discussed Admission: Completed 2 Chad Orientation: Patient has been orientated to the room, unit and the staff. Family: None  Orders have been reviewed and implemented. Will continue to monitor the patient. Call light has been placed within reach and bed alarm has been activated.   Alfonse Ras, RN  Phone Number: [phone]

## 2017-08-09 NOTE — Progress Notes (Signed)
Pt complains of SOB and has increased work of breathing with accessory muscle use. O2 sat 100 on 2L [place], HR 81, BP 121/79, RR 20, Breath sounds diminished with coarse and fine crackles bilaterally, and expiratory wheezes on the left side.   MD paged, awaiting response. Will continue to monitor

## 2017-08-09 NOTE — Progress Notes (Signed)
FPTS Interim Progress Note    O: BP 121/79   Pulse 81   Temp 98 F (36.7 C) (Oral)   Resp 20   Wt 218 lb (98.9 kg)   SpO2 100%   BMI 29.77 kg/m     A/P: Paged by nursing about headache that patient says is only resolved by tylenol.  Liver enzymes slightly elevated in setting of pancreatitis, low dose tylenol ordered  The patient is also wheezing and requesting a prn as opposed to "once" duoneb.  Has albuterol inhaler at home  350mg  q6prn tylenol duoneb q6prn  , DO 08/09/2017, 8:04 PM PGY-1, [facility] Family Medicine Service pager [phone]

## 2017-08-10 LAB — COMPREHENSIVE METABOLIC PANEL
ALK PHOS: 470 U/L — AB (ref 38–126)
ALT: 39 U/L (ref 17–63)
ANION GAP: 8 (ref 5–15)
AST: 28 U/L (ref 15–41)
Albumin: 2.2 g/dL — ABNORMAL LOW (ref 3.5–5.0)
BUN: 7 mg/dL (ref 6–20)
CALCIUM: 8.6 mg/dL — AB (ref 8.9–10.3)
CO2: 23 mmol/L (ref 22–32)
Chloride: 106 mmol/L (ref 101–111)
Creatinine, Ser: 0.89 mg/dL (ref 0.61–1.24)
Glucose, Bld: 147 mg/dL — ABNORMAL HIGH (ref 65–99)
Potassium: 4.1 mmol/L (ref 3.5–5.1)
SODIUM: 137 mmol/L (ref 135–145)
TOTAL PROTEIN: 6 g/dL — AB (ref 6.5–8.1)
Total Bilirubin: 1.1 mg/dL (ref 0.3–1.2)

## 2017-08-10 LAB — CBC
HCT: 27.9 % — ABNORMAL LOW (ref 39.0–52.0)
HEMOGLOBIN: 9 g/dL — AB (ref 13.0–17.0)
MCH: 28.9 pg (ref 26.0–34.0)
MCHC: 32.3 g/dL (ref 30.0–36.0)
MCV: 89.7 fL (ref 78.0–100.0)
Platelets: 284 10*3/uL (ref 150–400)
RBC: 3.11 MIL/uL — ABNORMAL LOW (ref 4.22–5.81)
RDW: 15.3 % (ref 11.5–15.5)
WBC: 4.6 10*3/uL (ref 4.0–10.5)

## 2017-08-10 LAB — LIPASE, BLOOD: LIPASE: 89 U/L — AB (ref 11–51)

## 2017-08-10 MED ORDER — BENZONATATE 100 MG PO CAPS
100.0000 mg | ORAL_CAPSULE | Freq: Three times a day (TID) | ORAL | Status: DC
  Administered 2017-08-10 – 2017-08-11 (×4): 100 mg via ORAL
  Filled 2017-08-10 (×4): qty 1

## 2017-08-10 NOTE — Progress Notes (Addendum)
Patient called nurse to room requesting for tylenol for headache. Per patient only tylenol helps with his headche.Patient also currently wheezing. Dr Parke Simmers paged awaiting call back.

## 2017-08-10 NOTE — Progress Notes (Signed)
Patient informed he has orders for tylenol and duoneb. Patient says he will request for medication if needed

## 2017-08-10 NOTE — Progress Notes (Signed)
Family Medicine Teaching Service Daily Progress Note Intern Pager: [phone]  Patient name: [name] Medical record number: [id number] Date of birth: 1956-05-01 Age: 61 y.o. Gender: male  Primary Care Provider: [name], PA-C Consultants: none Code Status: full  Pt Overview and Major Events to Date:  --Admitted on 10/7 Pancreatitis   Assessment and Plan: Mr. Treharne is a 61 y.o. Male with a PMH CAD, post CABG, AAA, right CEA, PAD, combined sys/diastolic CHF, CVA w/left sided motor deficit, ILD, lung nodule, tobacco use and normocytic anemia who presented 10/7 for evaluation of abdominal pain and fever.   Acute Pancreatitis, improving Presented with new onset abdominal pain, fever. CT abdomen consistent with acute pancreatitis. No pancreatic ductal dilation nor necrosis. Lipase 949>>249>>89  with improving LFTs. Alkp 657>> 534>>470. Pain is well controlled, decreased today. Differential alcohol vs infectious vs medication (sulfasalazine). -Continue to advance diet as tolerated  -Hold home sulfasalazine d/t association with pancreatitis  -D/C Zosyn, cultures negative -Patient on Oxycodone 5mg  q6h prn -Continue Miralax 17g daily. Patient had 1 BM -Monitor vital signs per floor protocol  Concern for sepsis, resolved   Patient with fever to 101.4 prior to arrival to ED. Normal vitals. His qSOFA is 0, resolved.  -D/C Zosyn -Blood cultures without growth  CAD s/pCABG, chronic Stable. History of MI 3 status post CABG 4 in 1993, LHC with severe native disease and 2 occluded grafts on LHC in 01/2017. Denies chest pain. Reports baseline dyspnea due to interstitial lung disease. On 10/8,EKG shows T wave abnormality concerning for ischemia.  -Continue home atorvastatin and aspirin -Continue Coreg from 3.125. Gradually increase back to home dose (12.5mg  BID).   AAA/right CEA in 11/2016/PAD, chronic, stable.  He was told that his AAA has increased to 4.5 cm, and will continue  to follow until 5cm.AAA 4.2 cm on CT abdomen 10/7. -Outpatient follow-up -Atorvastatin and aspirin as above   Combined systolic/diastolic HF, chronic Stable. Last echo on 02/26/2017 with EF of 35-40%, diffuse hypokinesis, some akinesis, G1 DD, mod-dilated and PAPP of42 mmHg. No signs of fluid overload on admission. Dyspnea at baseline with ILD. Patient continues to smoke 1/2-1 ppd.  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 Stable. with left-sided weakness. Had right CEA.  -Atorvastatin and aspirin as above  History of rheumatoid arthritis On sulfasalazine at home. -Hold home sulfasalazine in the setting of acute pancreatitis  History of solitary pulmonary nodule A 7 mm right upper lobe pulmonary nodule noted on CTA on 02/25/2017 Follow-up CT recommended in 18-24 months. He is followed by pulmonology Dr.Ramaswany. -Outpatient follow-up  History of interstitial lung disease Noted on high resolution CT on 04/05/2017.Followed by pulmonology. It appears he hasa repeat high-resolution CT scheduled for 08/17/2017. -Touch base with his pulmonologist if he wants to get this CT scan this admission -Duoneb ordered q6h PRN   Tobacco use Smokes about a pack a day since he is 61 years of age. CAD down to half a pack every week in 11/2016 when he had MI. On Chantix outpatient -Offer nicotine patch  Normocytic anemia Hemoglobin 9.3 (10/8)>>9.0 (slighly below baseline). MCV 90>>89.7. Recent anemia panel on 02/25/2017 with iron saturation ratio 5%.   Hx of Headaches Patient reports chronic history of mild headaches which he uses Tylenol for relief.  -Tylenol 350mg  q6prn    FEN/GI: ADAT, Heart healthy Prophylaxis:Lovenox  Disposition: Pending clinical improvement   Subjective:  Today the patient reports decreased  pain. He denies any abdominal pain, but reports continued 3/10 lower back pain he  believes is due to the bed. He reports having one BM this morning. The patient notes ability to eat broth and jello last night without issue. Patient has been able to ambulate without issue. Of note, patient had headache overnight which was relieved with Tylenol. He denies chest pain, fatigue, nausea, vomiting or diarrhea.   Objective: Temp:  [98 F (36.7 C)-98.6 F (37 C)] 98.6 F (37 C) (10/09 0537) Pulse Rate:  [81-89] 88 (10/09 0537) Resp:  [16-20] 20 (10/09 0537) BP: (108-129)/(61-79) 108/61 (10/09 0537) SpO2:  [97 %-100 %] 97 % (10/09 0537) Weight:  [217 lb 9.5 oz (98.7 kg)] 217 lb 9.5 oz (98.7 kg) (10/08 2142) Physical Exam: General: Alert and oriented x4. No distress. 2L [place] Oxygen.  Cardiovascular: Regular rate and rhythm.  Respiratory: Decreased respiratory effort. Soft crackles heard throughout lung fields. No wheezing.  Abdomen: Distended, non-tender. BS are present.   Extremities: No LE edema. DP pulses strong. MSK: No pain to palpation of lumbar spine  Laboratory:  Recent Labs Lab 08/08/17 1618 08/09/17 0308 08/10/17 0740  WBC 5.2 5.2 4.6  HGB 10.6* 9.3* 9.0*  HCT 32.6* 29.8* 27.9*  PLT 256 285 284    Recent Labs Lab 08/08/17 1618 08/09/17 0308 08/10/17 0740  NA 138 137 137  K 4.1 3.8 4.1  CL 112* 107 106  CO2 19* 20* 23  BUN 13 11 7   CREATININE 1.06 0.99 0.89  CALCIUM 8.4* 8.4* 8.6*  PROT 6.5 6.1* 6.0*  BILITOT 1.2 0.9 1.1  ALKPHOS 657* 534* 470*  ALT 61 52 39  AST 108* 63* 28  GLUCOSE 110* 129* 147*     Imaging/Diagnostic Tests: Ct Abdomen Pelvis W Contrast  Result Date: 08/08/2017 CLINICAL DATA:  Shortness of breath, fever and chills EXAM: CT ABDOMEN AND PELVIS WITH CONTRAST TECHNIQUE: Multidetector CT imaging of the abdomen and pelvis was performed using the standard protocol following bolus administration of intravenous contrast. CONTRAST:  10/08/2017 ISOVUE-300 IOPAMIDOL (ISOVUE-300) INJECTION 61% COMPARISON:  None. FINDINGS: Lower chest:  Extensive bilateral reticular opacities. No pleural effusion. Hepatobiliary: Normal hepatic contours and density. No visible biliary dilatation. Normal gallbladder. Pancreas: Mild edema adjacent to the pancreatic uncinate process. No pancreatic ductal dilatation. No peripancreatic fluid collection. Spleen: Normal. Adrenals/Urinary Tract: --Adrenal glands: Normal. --Right kidney/ureter: No hydronephrosis or perinephric stranding. No nephrolithiasis. No obstructing ureteral stones. --Left kidney/ureter: No hydronephrosis or perinephric stranding. No nephrolithiasis. No obstructing ureteral stones. --Urinary bladder: Unremarkable. Stomach/Bowel: --Stomach/Duodenum: Normal --Small bowel: No dilatation or inflammation. --Colon: No focal abnormality. --Appendix: Normal. Vascular/Lymphatic: There is extensive aortic calcification. There is a partially thrombosed infrarenal abdominal aortic aneurysm that measures 4.2 x 3.9 cm. No abdominal or pelvic lymphadenopathy. Reproductive: Normal prostate and seminal vesicles. Musculoskeletal. No bony spinal canal stenosis or focal osseous abnormality. Other: None. IMPRESSION: 1. Mild edema surrounding the pancreatic head and uncinate process, compatible with acute pancreatitis in the appropriate context. No peripancreatic fluid collection or pancreatic ductal dilatation. 2. Partially thrombosed infrarenal abdominal aortic aneurysm, measuring up to 4.2 cm. Recommend followup by ultrasound in 1 year. This recommendation follows ACR consensus guidelines: White Paper of the ACR Incidental Findings Committee II on Vascular Findings. J Am Coll Radiol 2013; 10:789-794. Aortic aneurysm NOS (ICD10-I71.9) 3.  Aortic Atherosclerosis (ICD10-I70.0). 4. Bibasilar interstitial lung disease. Electronically Signed   By: 01-02-1987 M.D.   On: 08/08/2017 21:48   10/08/2017 Abdomen Limited  Result Date: 08/08/2017 CLINICAL  DATA:  Intermittent abdominal pain, onset this morning. EXAM: ULTRASOUND ABDOMEN  LIMITED RIGHT UPPER QUADRANT COMPARISON:  None. FINDINGS: Gallbladder: There are nonshadowing nonmobile echogenic foci, likely representing polyps. These measure up to 8 mm. No shadowing calculi. No gallbladder mural thickening. No pericholecystic fluid. No tenderness to probe pressure over the gallbladder. Common bile duct: Diameter: Normal, 3.6 mm Liver: No focal lesion identified. Within normal limits in parenchymal echogenicity. Portal vein is patent on color Doppler imaging with normal direction of blood flow towards the liver. IMPRESSION: No sonographic evidence of cholecystitis. Probable gallbladder polyps measuring up to 8 mm. Normal bile ducts. Electronically Signed   By: Ellery Plunk M.D.   On: 08/08/2017 18:39   Dg Chest Portable 1 View  Result Date: 08/08/2017 CLINICAL DATA:  Cough EXAM: PORTABLE CHEST 1 VIEW COMPARISON:  02/27/2017 chest radiograph. FINDINGS: Stable configuration of sternotomy wires noting multiple discontinuities in the upper 3 sternotomy wires. CABG clips overlie the mediastinum. Stable cardiomediastinal silhouette with mild cardiomegaly. No pneumothorax. No pleural effusion. Patchy reticular opacities and mild distortion throughout both lungs, questionably mildly worsened in the interval. No acute superimposed consolidative airspace disease. IMPRESSION: Stable mild cardiomegaly. Apparent mild worsening of patchy reticular opacities throughout both lungs, which could represent progression of the interstitial lung disease described on 04/05/2017 high-resolution chest CT study versus mild pulmonary edema superimposed on stable chronic interstitial lung disease. Electronically Signed   By: Delbert Phenix M.D.   On: 08/08/2017 16:42    I have seen and evaluated the patient with medical student. I am in agreement with the note above in its revised form. Lovena Neighbours, MD Family Medicine, PGY-2  Christie Nottingham, Medical Student 08/10/2017, 9:32 AM [place] [facility] FPTS Intern pager: [phone], text pages welcome

## 2017-08-11 LAB — CBC
HCT: 32.6 % — ABNORMAL LOW (ref 39.0–52.0)
Hemoglobin: 10.4 g/dL — ABNORMAL LOW (ref 13.0–17.0)
MCH: 28.8 pg (ref 26.0–34.0)
MCHC: 31.9 g/dL (ref 30.0–36.0)
MCV: 90.3 fL (ref 78.0–100.0)
PLATELETS: 330 10*3/uL (ref 150–400)
RBC: 3.61 MIL/uL — AB (ref 4.22–5.81)
RDW: 14.8 % (ref 11.5–15.5)
WBC: 5.5 10*3/uL (ref 4.0–10.5)

## 2017-08-11 LAB — COMPREHENSIVE METABOLIC PANEL
ALT: 34 U/L (ref 17–63)
AST: 22 U/L (ref 15–41)
Albumin: 2.6 g/dL — ABNORMAL LOW (ref 3.5–5.0)
Alkaline Phosphatase: 477 U/L — ABNORMAL HIGH (ref 38–126)
Anion gap: 11 (ref 5–15)
BUN: 5 mg/dL — AB (ref 6–20)
CALCIUM: 9 mg/dL (ref 8.9–10.3)
CO2: 26 mmol/L (ref 22–32)
Chloride: 102 mmol/L (ref 101–111)
Creatinine, Ser: 0.93 mg/dL (ref 0.61–1.24)
GFR calc non Af Amer: >60 mL/min (ref >60)
GLUCOSE: 130 mg/dL — AB (ref 65–99)
Potassium: 4.2 mmol/L (ref 3.5–5.1)
SODIUM: 139 mmol/L (ref 135–145)
Total Bilirubin: 1.2 mg/dL (ref 0.3–1.2)
Total Protein: 6.8 g/dL (ref 6.5–8.1)

## 2017-08-11 MED ORDER — POLYETHYLENE GLYCOL 3350 17 G PO PACK: 17.0000 g | PACK | Freq: Every day | ORAL | 0 refills | Status: DC

## 2017-08-11 MED ORDER — CARVEDILOL 6.25 MG PO TABS: 6.2500 mg | ORAL_TABLET | Freq: Two times a day (BID) | ORAL | 11 refills | Status: DC

## 2017-08-11 MED ORDER — CARVEDILOL 6.25 MG PO TABS: 6.2500 mg | ORAL_TABLET | Freq: Two times a day (BID) | ORAL | Status: DC

## 2017-08-11 NOTE — Progress Notes (Signed)
Family Medicine Teaching Service Daily Progress Note Intern Pager: [phone]  Patient name: [name] Medical record number: [id number] Date of birth: [birth year] Age: 61 y.o. Gender: male  Primary Care Provider: [name], PA-C Consultants: none Code Status: full  Pt Overview and Major Events to Date:  -Patient admitted 10/7 for pancreatitis  -Patient gradually improved symptoms. Plan discharge today 10/10  Assessment and Plan: [name] is a 61 y.o. Male with a PMH CAD, post CABG, AAA, right CEA, PAD, combined sys/diastolic CHF, CVA w/left sided motor deficit, ILD, lung nodule, tobacco use and normocytic anemia who presented 10/7 for evaluation of abdominal pain and fever.   Acute Pancreatitis, improving Presented with new onset abdominal pain, fever. CT abdomen consistent with acute pancreatitis. No pancreatic ductal dilation nor necrosis. Lipase 949>>249>89 (10/9) and WNL LFTs. Alkp 657>> 628>366>294 today. Pain is well controlled, absent today. Differential infectious vs medication (sulfasalazine). -On Full diet and doing well -Hold home sulfasalazine d/t association with pancreatitis. F/U outpatient for medication -Patient on Oxycodone 5mg  q6h prn. Will discharge with a few days of this.  -Continue Miralax 17g daily. Patient had 1 BM last night -Vital signs stable   Concern for sepsis, resolved  Patient with fever to 101.4 prior to arrival to ED. Normal vitals.His qSOFA is 0, resolved.  -Bloodcultures without growth  CAD s/pCABG, chronic Stable. History of MI 3 status post CABG 4 in 1993, LHC with severe native disease and 2 occluded grafts on LHC in 01/2017. Denies chest pain. Reports baseline dyspnea due to interstitial lung disease. On 10/8,EKG showsT wave abnormality concerning for ischemia.  -Continue home atorvastatin and aspirin -IncreaseCoreg from 3.125 to 6.25 today. D/c on this dose and follow up outpatient.   AAA/right CEA in 11/2016/PAD,  chronic, stable. He was told that his AAA has increased to 4.5 cm, and will continue to follow until 5cm.AAA 4.2 cm on CT abdomen 10/7. -Outpatient follow-up -Atorvastatin and aspirin as above   Combined systolic/diastolic HF, chronic Stable. Last echo on 02/26/2017 with EF of 35-40%, diffuse hypokinesis, some akinesis, G1 DD, mod-dilated and PAPP of42 mmHg. No signs of fluid overload on admission. Dyspnea at baseline with ILD. Patient continues to smoke 1/2-1 ppd.  -Strict I&O's, daily weights -Continue to monitor fluid status -hold home meloxicam  History of CVA in 11/2016: Chronic. Stable. with left-sided weakness. Had right CEA.  -Atorvastatin and aspirin as above  History of rheumatoid arthritis On sulfasalazine at home. -Hold home sulfasalazine in the setting of acute pancreatitis. F/U for this outpatient   History of solitary pulmonary nodule A 7 mm right upper lobe pulmonary nodule noted on CTA on 02/25/2017 Follow-up CT recommended in 18-24 months. He is followed by pulmonology Dr.Ramaswany. -Outpatient follow-up  History of interstitial lung disease Noted on high resolution CT on 04/05/2017.Followed by pulmonology. It appears he hasa repeat high-resolution CT scheduled for 08/17/2017. -Touch base with his pulmonologist if he wants to get this CT scan this admission -Duoneb ordered q6h PRN  -On tessalon pearls TID  Tobacco use Smokes about a pack a day since he is 61 years of age. CAD down to half a pack every week in 11/2016 when he had MI. On Chantix outpatient -Offer nicotine patch  Normocytic anemia Hemoglobin 9.3 (10/8)>9.0> 10.4 today. MCV 90>>89.7. Recent anemia panel on 02/25/2017 with iron saturation ratio 5%.   Hx of Headaches Patient reports chronic history of mild headaches which he uses Tylenol for relief.  -Tylenol 350mg  q6prn    FEN/GI:  ADAT,Heart healthy Prophylaxis:Lovenox   Disposition: Continued management and discharge today    Subjective:  Patient is 61 y.o. Male who presented 10/7 for evaluation of abdominal pain. Patient feels his symptoms are greatly improved today. He denies back or abdominal pain, nausea, vomiting or diarrhea. He still reports persistent cough with productive yellow sputum and mild headache 3/10. Patient is ready to go home.   Objective: Temp:  [98.2 F (36.8 C)-98.7 F (37.1 C)] 98.2 F (36.8 C) (10/10 1037) Pulse Rate:  [73-90] 90 (10/10 1037) Resp:  [16-20] 18 (10/10 1037) BP: (95-112)/(64-70) 105/64 (10/10 1037) SpO2:  [97 %-98 %] 98 % (10/10 1037) Weight:  [217 lb 9.5 oz (98.7 kg)] 217 lb 9.5 oz (98.7 kg) (10/10 1037)  Physical Exam: General: Alert and oriented x4. No acute distress. Sitting comfortable on RA Cardiovascular: Regular rate and rhythm.  Respiratory: Coughs with deep breathing. Crackles heard throughout lung fields.  Abdomen: Soft, non-tender. BS are present. Less distension today  Extremities: No LE edema    Laboratory:  Recent Labs Lab 08/09/17 0308 08/10/17 0740 08/11/17 0717  WBC 5.2 4.6 5.5  HGB 9.3* 9.0* 10.4*  HCT 29.8* 27.9* 32.6*  PLT 285 284 330    Recent Labs Lab 08/09/17 0308 08/10/17 0740 08/11/17 0717  NA 137 137 139  K 3.8 4.1 4.2  CL 107 106 102  CO2 20* 23 26  BUN 11 7 5*  CREATININE 0.99 0.89 0.93  CALCIUM 8.4* 8.6* 9.0  PROT 6.1* 6.0* 6.8  BILITOT 0.9 1.1 1.2  ALKPHOS 534* 470* 477*  ALT 52 39 34  AST 63* 28 22  GLUCOSE 129* 147* 130*    Imaging/Diagnostic Tests: No results found.   Christie Nottingham, Medical Student 08/11/2017, 12:00 PM [facility] FPTS Intern pager: [phone], text pages welcome

## 2017-08-11 NOTE — Discharge Summary (Signed)
Family Medicine Teaching [facility] Discharge Summary  Patient name: [name] Medical record number: [id number] Date of birth: Nov 19, 1955 Age: 61 y.o. Gender: male Date of Admission: 08/08/2017 Date of Discharge: 08/11/2017 Admitting Physician: Nestor Ramp, MD  Primary Care Provider: [name], PA-C Consultants: None  Indication for Hospitalization: Abdominal Pain, fever  Discharge Diagnoses/Problem List:  Pancreatitis CAD s/p CABG AAA Right CEA PAD Combined systolic/diastolic CHF ILD CVA with left-sided motor deficit Lung nodule  Disposition: Home  Discharge Condition: Stable and improving  Discharge Exam:  General: NAD, pleasant, able to participate in exam Cardiac: RRR, normal heart sounds, no murmurs. 2+ radial and PT pulses bilaterally Respiratory: Minimal crackles noted on lung exam, normal effort, No wheezes, rales or rhonchi Abdomen: soft, nontender, improved distention, no hepatic or splenomegaly, +BS Extremities: no edema or cyanosis. WWP. Skin: warm and dry, no rashes noted Neuro: alert and oriented x4, no focal deficits Psych: Normal affect and mood   Brief Hospital Course:  Patient is a 61 year old male who initially presented with abdominal pain, fever and vital signs concerning for sepsis for which he was started on Zosyn. CMP showed alkaline phosphatase of 657 with elevated AST and ALT with a lipase of 949. Patient was subsequently diagnosed with pancreatitis. There was also an initial concern for pneumonia seen on imaging. He was started on ceftriaxone and azithromycin. Right lower quadrant ultrasound was unremarkable. Given initial fever there was also concern for cholangitis.  He was managed conservatively for pancreatitis. Initially etiology was unclear, however patient was on sulfasalazine which is known to cause pancreatitis. Pancreatitis could have also been secondary to gallstone given elevated alkaline phosphatase on initial  admission. Sulfasalazine was discontinued. Pain was controlled, and diet was gently advanced which patient tolerated well. LFTs were trended throughout admission, and continued to improved. Pain had resolved on hospital day 2 with stable vitals. Alkaline phosphatase was still slightly elevated. Given significant clinical improvement, antibiotics were discontinued patient was monitored over 24 hours with no change in clinical picture. Primary team was satisfied with response to treatment, and felt that patient was stable for discharge with close follow-up with PCP. Issues for Follow Up:  1. Follow up with PCP, patient will need repeat LFTs given elevated alkaline phosphatase 2. Sulfasalazine discontinued given initial presentation as a possible cause for pancreatitis. 3. Coreg was titrated down from 12.5 twice a day to 6.125 twice a day during hospitalization. Patient can be up titrated by PCP as needed. 4. Patient will need follow-up with pulmonary given history of lung disease.  Significant Procedures: None  Significant Labs and Imaging:   Recent Labs Lab 08/09/17 0308 08/10/17 0740 08/11/17 0717  WBC 5.2 4.6 5.5  HGB 9.3* 9.0* 10.4*  HCT 29.8* 27.9* 32.6*  PLT 285 284 330    Recent Labs Lab 08/08/17 1618 08/09/17 0308 08/10/17 0740 08/11/17 0717  NA 138 137 137 139  K 4.1 3.8 4.1 4.2  CL 112* 107 106 102  CO2 19* 20* 23 26  GLUCOSE 110* 129* 147* 130*  BUN 13 11 7  5*  CREATININE 1.06 0.99 0.89 0.93  CALCIUM 8.4* 8.4* 8.6* 9.0  ALKPHOS 657* 534* 470* 477*  AST 108* 63* 28 22  ALT 61 52 39 34  ALBUMIN 2.5* 2.3* 2.2* 2.6*    Results/Tests Pending at Time of Discharge: None  Discharge Medications:  Allergies as of 08/11/2017   No Known Allergies     Medication List    STOP taking these medications  azaTHIOprine 50 MG tablet Commonly known as:  IMURAN   losartan 25 MG tablet Commonly known as:  COZAAR   meloxicam 7.5 MG tablet Commonly known as:  MOBIC      TAKE these medications   acetaminophen 500 MG tablet Commonly known as:  TYLENOL Take 1,000 mg by mouth every 6 (six) hours as needed for mild pain.   albuterol 108 (90 Base) MCG/ACT inhaler Commonly known as:  PROVENTIL HFA;VENTOLIN HFA Inhale 2 puffs into the lungs every 6 (six) hours as needed for wheezing or shortness of breath.   aspirin 81 MG chewable tablet Chew 1 tablet (81 mg total) by mouth daily.   atorvastatin 40 MG tablet Commonly known as:  LIPITOR Take 1 tablet (40 mg total) by mouth daily at 6 PM.   carvedilol 6.25 MG tablet Commonly known as:  COREG Take 1 tablet (6.25 mg total) by mouth 2 (two) times daily with a meal. What changed:  medication strength  how much to take  when to take this   gabapentin 300 MG capsule Commonly known as:  NEURONTIN Take 1 capsule (300 mg total) by mouth every 8 (eight) hours.   HYDROcodone-acetaminophen 5-325 MG tablet Commonly known as:  NORCO/VICODIN Take 1 tablet by mouth 2 (two) times daily as needed for moderate pain.   lisinopril 20 MG tablet Commonly known as:  PRINIVIL,ZESTRIL Take 1 tablet (20 mg total) by mouth daily. What changed:  when to take this   MUSCLE RUB EX Apply 1 application topically as needed (for shoulder or knee pain).   OXYGEN Inhale 2 L into the lungs at bedtime.   polyethylene glycol packet Commonly known as:  MIRALAX / GLYCOLAX Take 17 g by mouth daily.   predniSONE 5 MG tablet Commonly known as:  DELTASONE Take 2 tablets (10mg ) twice daily.   spironolactone 25 MG tablet Commonly known as:  ALDACTONE Take 1 tablet (25 mg total) by mouth daily.       Discharge Instructions: Please refer to Patient Instructions section of EMR for full details.  Patient was counseled important signs and symptoms that should prompt return to medical care, changes in medications, dietary instructions, activity restrictions, and follow up appointments.   Follow-Up Appointments: Follow-up  Information    , PA-C. Schedule an appointment as soon as possible for a visit.   Specialty:  Physician Assistant Why:  for hospital follow up Contact information: [address] [place] [phone]           [phone], MD 08/11/2017, 5:56 PM PGY-3, [facility]

## 2017-08-11 NOTE — Discharge Instructions (Signed)
It has been a pleasure taking care of you! You were admitted due to acute pancreatitis. We have treated you with medications and fluids. With that your symptoms improved to the point we think it is safe to let you go home and follow up with your primary care doctor. There could be some changes made to your home medications during this hospitalization. Please, make sure to read the directions before you take them. The names and directions on how to take these medications are found on this discharge paper under medication section.  Once you are discharged, your primary care physician will handle any further medical issues. Please note that NO REFILLS for any discharge medications will be authorized once you are discharged, as it is imperative that you return to your primary care physician (or establish a relationship with a primary care physician if you do not have one) for your aftercare needs so that they can reassess your need for medications and monitor your lab values. Take care,

## 2017-08-12 NOTE — Progress Notes (Signed)
[name] to be D/C'd Home per MD order.  Discussed prescriptions and follow up appointments with the patient. Prescriptions given to patient, medication list explained in detail. Pt verbalized understanding.  Allergies as of 08/11/2017   No Known Allergies     Medication List    STOP taking these medications   azaTHIOprine 50 MG tablet Commonly known as:  IMURAN   losartan 25 MG tablet Commonly known as:  COZAAR   meloxicam 7.5 MG tablet Commonly known as:  MOBIC     TAKE these medications   acetaminophen 500 MG tablet Commonly known as:  TYLENOL Take 1,000 mg by mouth every 6 (six) hours as needed for mild pain.   albuterol 108 (90 Base) MCG/ACT inhaler Commonly known as:  PROVENTIL HFA;VENTOLIN HFA Inhale 2 puffs into the lungs every 6 (six) hours as needed for wheezing or shortness of breath.   aspirin 81 MG chewable tablet Chew 1 tablet (81 mg total) by mouth daily.   atorvastatin 40 MG tablet Commonly known as:  LIPITOR Take 1 tablet (40 mg total) by mouth daily at 6 PM.   carvedilol 6.25 MG tablet Commonly known as:  COREG Take 1 tablet (6.25 mg total) by mouth 2 (two) times daily with a meal. What changed:  medication strength  how much to take  when to take this   gabapentin 300 MG capsule Commonly known as:  NEURONTIN Take 1 capsule (300 mg total) by mouth every 8 (eight) hours.   HYDROcodone-acetaminophen 5-325 MG tablet Commonly known as:  NORCO/VICODIN Take 1 tablet by mouth 2 (two) times daily as needed for moderate pain.   lisinopril 20 MG tablet Commonly known as:  PRINIVIL,ZESTRIL Take 1 tablet (20 mg total) by mouth daily. What changed:  when to take this   MUSCLE RUB EX Apply 1 application topically as needed (for shoulder or knee pain).   OXYGEN Inhale 2 L into the lungs at bedtime.   polyethylene glycol packet Commonly known as:  MIRALAX / GLYCOLAX Take 17 g by mouth daily.   predniSONE 5 MG tablet Commonly known as:   DELTASONE Take 2 tablets (10mg ) twice daily.   spironolactone 25 MG tablet Commonly known as:  ALDACTONE Take 1 tablet (25 mg total) by mouth daily.       Vitals:   08/11/17 0743 08/11/17 1037  BP: 110/66 105/64  Pulse: 86 90  Resp:  18  Temp:  98.2 F (36.8 C)  SpO2:  98%    Skin clean, dry and intact without evidence of skin break down, no evidence of skin tears noted. IV catheter discontinued intact. Site without signs and symptoms of complications. Dressing and pressure applied. Pt denies pain at this time. No complaints noted.  An After Visit Summary was printed and given to the patient. Patient escorted via WC, and D/C home via private auto.  10/11/17 RN, BSN

## 2017-08-13 LAB — CULTURE, BLOOD (ROUTINE X 2)
CULTURE: NO GROWTH
Culture: NO GROWTH
SPECIAL REQUESTS: ADEQUATE
SPECIAL REQUESTS: ADEQUATE

## 2017-08-13 NOTE — Progress Notes (Signed)
No sepsis

## 2017-08-13 NOTE — Addendum Note (Signed)
Addended by: Burton Apley A on: 08/13/2017 09:53 AM   Modules accepted: Orders

## 2017-08-13 NOTE — Progress Notes (Signed)
No pneumonia

## 2017-08-17 ENCOUNTER — Telehealth: Payer: Self-pay | Admitting: Internal Medicine

## 2017-08-17 ENCOUNTER — Ambulatory Visit (INDEPENDENT_AMBULATORY_CARE_PROVIDER_SITE_OTHER)
Admission: RE | Admit: 2017-08-17 | Discharge: 2017-08-17 | Disposition: A | Discharge status: Home / Self Care | Payer: Medicaid Other | Source: Ambulatory Visit | Attending: Internal Medicine | Admitting: Internal Medicine

## 2017-08-17 DIAGNOSIS — J849 Interstitial pulmonary disease, unspecified: Secondary | ICD-10-CM | POA: Diagnosis not present

## 2017-08-17 NOTE — Telephone Encounter (Signed)
Spoke with Dr. Ardith Dark office, I advised them that he was out on vacation but she would like for MR to call back when he returns.

## 2017-08-23 NOTE — Telephone Encounter (Signed)
Called Dr Bock';s office - [facility] directly on my cell. She is not in 08/23/2017. LEt patiet know this and also that CT still shows ILD and I will be talking to Dr Renard Matter but also see patient at fu nov 2018  .Dr. Kalman Shan, M.D., [facility].C.P Pulmonary and Critical Care Medicine Staff Physician [facility] Pager: [phone], If no answer or between  15:00h - 7:00h: call [phone]  08/23/2017 9:54 AM

## 2017-08-26 ENCOUNTER — Encounter (INDEPENDENT_AMBULATORY_CARE_PROVIDER_SITE_OTHER): Payer: Self-pay | Admitting: Physician Assistant

## 2017-08-26 ENCOUNTER — Ambulatory Visit (INDEPENDENT_AMBULATORY_CARE_PROVIDER_SITE_OTHER): Payer: Medicaid Other | Admitting: Physician Assistant

## 2017-08-26 VITALS — BP 123/86 | HR 92 | Temp 97.5°F | Wt 190.0 lb

## 2017-08-26 DIAGNOSIS — J189 Pneumonia, unspecified organism: Secondary | ICD-10-CM | POA: Diagnosis not present

## 2017-08-26 DIAGNOSIS — A419 Sepsis, unspecified organism: Secondary | ICD-10-CM | POA: Diagnosis not present

## 2017-08-26 DIAGNOSIS — R740 Nonspecific elevation of levels of transaminase and lactic acid dehydrogenase [LDH]: Secondary | ICD-10-CM

## 2017-08-26 DIAGNOSIS — I359 Nonrheumatic aortic valve disorder, unspecified: Secondary | ICD-10-CM | POA: Diagnosis not present

## 2017-08-26 DIAGNOSIS — Z1211 Encounter for screening for malignant neoplasm of colon: Secondary | ICD-10-CM | POA: Diagnosis not present

## 2017-08-26 DIAGNOSIS — R7401 Elevation of levels of liver transaminase levels: Secondary | ICD-10-CM

## 2017-08-26 NOTE — Progress Notes (Signed)
Subjective:  Patient ID: [name], male    DOB: [birth year]  Age: 61 y.o. MRN: [id number]  CC: hospital f/u   HPI  [name] is a 61 y.o. male with a PMH of CAD, MI, stroke, ILD, and tobacco abuse presents with arthralgia of the knee and shoulder bilaterally presents to f/u of hospital admission from 08/08/17 to 08/11/17 for sepsis, pancreatitis, elevated transaminases, concern for PNA, and concern for cholangitis. Patient responded well to several antibiotics used in hospital and recovered relatively quickly. Feels significantly improved symptomatically. Endorses a mild left flank pain. Has not had a colonoscopy done yet. Does not endorse constipation, diarrhea, or hematochezia. Does not endorse CP, palpitations, SOB, HA, f/c/n/v, rash, or GU sxs.    Outpatient Medications Prior to Visit  Medication Sig Dispense Refill  . acetaminophen (TYLENOL) 500 MG tablet Take 1,000 mg by mouth every 6 (six) hours as needed for mild pain.     [name] albuterol (PROVENTIL HFA;VENTOLIN HFA) 108 (90 Base) MCG/ACT inhaler Inhale 2 puffs into the lungs every 6 (six) hours as needed for wheezing or shortness of breath. 1 Inhaler 6  . aspirin 81 MG chewable tablet Chew 1 tablet (81 mg total) by mouth daily. 30 tablet 0  . atorvastatin (LIPITOR) 40 MG tablet Take 1 tablet (40 mg total) by mouth daily at 6 PM. 30 tablet 11  . carvedilol (COREG) 6.25 MG tablet Take 1 tablet (6.25 mg total) by mouth 2 (two) times daily with a meal. 60 tablet 11  . gabapentin (NEURONTIN) 300 MG capsule Take 1 capsule (300 mg total) by mouth every 8 (eight) hours. 90 capsule 2  . lisinopril (PRINIVIL,ZESTRIL) 20 MG tablet Take 1 tablet (20 mg total) by mouth daily. (Patient taking differently: Take 20 mg by mouth 2 (two) times daily. ) 90 tablet 3  . Menthol-Methyl Salicylate (MUSCLE RUB EX) Apply 1 application topically as needed (for shoulder or knee pain).     . OXYGEN Inhale 2 L into the lungs at bedtime.    . polyethylene  glycol (MIRALAX / GLYCOLAX) packet Take 17 g by mouth daily. 14 each 0  . predniSONE (DELTASONE) 5 MG tablet Take 2 tablets (10mg ) twice daily.    spironolactone (ALDACTONE) 25 MG tablet Take 1 tablet (25 mg total) by mouth daily. 30 tablet 11   No facility-administered medications prior to visit.      ROS Review of Systems  Constitutional: Negative for chills, fever and malaise/fatigue.  Eyes: Negative for blurred vision.  Respiratory: Negative for shortness of breath.   Cardiovascular: Negative for chest pain and palpitations.  Gastrointestinal: Negative for abdominal pain and nausea.  Genitourinary: Negative for dysuria and hematuria.  Musculoskeletal: Negative for joint pain and myalgias.  Skin: Negative for rash.  Neurological: Negative for tingling and headaches.  Psychiatric/Behavioral: Negative for depression. The patient is not nervous/anxious.     Objective:  There were no vitals taken for this visit.  BP/Weight 08/11/2017 08/06/2017 07/15/2017  Systolic BP 105 100 150  Diastolic BP 64 71 100  Wt. (Lbs) 217.59 203.6 196  BMI 30.35 27.81 25.86      Physical Exam  Constitutional: He is oriented to person, place, and time.  Well developed, overweight, NAD, polite  HENT:  Head: Normocephalic and atraumatic.  Eyes: No scleral icterus.  Neck: Normal range of motion. Neck supple. No thyromegaly present.  Cardiovascular: Normal rate, regular rhythm and normal heart sounds.   Pulmonary/Chest: Effort normal. No respiratory distress.  He has no wheezes.  Diffuse rhonchi bilaterally  Abdominal: Soft. Bowel sounds are normal. There is tenderness (mild left flank tenderness).  Musculoskeletal: He exhibits no edema.  Neurological: He is alert and oriented to person, place, and time. No cranial nerve deficit. Coordination normal.  Skin: Skin is warm and dry. No rash noted. No erythema. No pallor.  Psychiatric: He has a normal mood and affect. His behavior is normal. Thought  content normal.  Vitals reviewed.    Assessment & Plan:   1. Elevated transaminase level - ALP 477 while in hospital. Will repeat and work up accordingly. - Lipase - Comprehensive metabolic panel - CBC with Differential  2. Sepsis, due to unspecified organism [facility]) - Received several antibiotics while in hospital. No growth in blood cultures after 5 days. - Lipase - Comprehensive metabolic panel - CBC with Differential  3. Encounter for screening colonoscopy - Ambulatory referral to Gastroenterology  4. Pneumonia due to infectious organism, unspecified laterality, unspecified part of lung - DG Chest 2 View; Future - Keep appt with pulmonology for ILD.  5. Aortic valve calcification - Found incidentally on CT chest while in hospital. Echo from 02/26/17 revealed "Mildly thickened leaflets. Cusp separation was normal.  Doppler:  Transvalvular velocity was within the normal range. There was no stenosis. There was no regurgitation."     Follow-up: Return if symptoms worsen or fail to improve.   [name] PA

## 2017-08-26 NOTE — Patient Instructions (Signed)
Please call cardiology to make a follow up appointment to address aortic valve calcification.    Colonoscopy, Adult A colonoscopy is an exam to look at the entire large intestine. During the exam, a lubricated, bendable tube is inserted into the anus and then passed into the rectum, colon, and other parts of the large intestine. A colonoscopy is often done as a part of normal colorectal screening or in response to certain symptoms, such as anemia, persistent diarrhea, abdominal pain, and blood in the stool. The exam can help screen for and diagnose medical problems, including:  Tumors.  Polyps.  Inflammation.  Areas of bleeding.  Tell a health care provider about:  Any allergies you have.  All medicines you are taking, including vitamins, herbs, eye drops, creams, and over-the-counter medicines.  Any problems you or family members have had with anesthetic medicines.  Any blood disorders you have.  Any surgeries you have had.  Any medical conditions you have.  Any problems you have had passing stool. What are the risks? Generally, this is a safe procedure. However, problems may occur, including:  Bleeding.  A tear in the intestine.  A reaction to medicines given during the exam.  Infection (rare).  What happens before the procedure? Eating and drinking restrictions Follow instructions from your health care provider about eating and drinking, which may include:  A few days before the procedure - follow a low-fiber diet. Avoid nuts, seeds, dried fruit, raw fruits, and vegetables.  1-3 days before the procedure - follow a clear liquid diet. Drink only clear liquids, such as clear broth or bouillon, black coffee or tea, clear juice, clear soft drinks or sports drinks, gelatin dessert, and popsicles. Avoid any liquids that contain red or purple dye.  On the day of the procedure - do not eat or drink anything during the 2 hours before the procedure, or within the time period  that your health care provider recommends.  Bowel prep If you were prescribed an oral bowel prep to clean out your colon:  Take it as told by your health care provider. Starting the day before your procedure, you will need to drink a large amount of medicated liquid. The liquid will cause you to have multiple loose stools until your stool is almost clear or light green.  If your skin or anus gets irritated from diarrhea, you may use these to relieve the irritation: ? Medicated wipes, such as adult wet wipes with aloe and vitamin E. ? A skin soothing-product like petroleum jelly.  If you vomit while drinking the bowel prep, take a break for up to 60 minutes and then begin the bowel prep again. If vomiting continues and you cannot take the bowel prep without vomiting, call your health care provider.  General instructions  Ask your health care provider about changing or stopping your regular medicines. This is especially important if you are taking diabetes medicines or blood thinners.  Plan to have someone take you home from the hospital or clinic. What happens during the procedure?  An IV tube may be inserted into one of your veins.  You will be given medicine to help you relax (sedative).  To reduce your risk of infection: ? Your health care team will wash or sanitize their hands. ? Your anal area will be washed with soap.  You will be asked to lie on your side with your knees bent.  Your health care provider will lubricate a long, thin, flexible tube. The tube will  have a camera and a light on the end.  The tube will be inserted into your anus.  The tube will be gently eased through your rectum and colon.  Air will be delivered into your colon to keep it open. You may feel some pressure or cramping.  The camera will be used to take images during the procedure.  A small tissue sample may be removed from your body to be examined under a microscope (biopsy). If any potential  problems are found, the tissue will be sent to a lab for testing.  If small polyps are found, your health care provider may remove them and have them checked for cancer cells.  The tube that was inserted into your anus will be slowly removed. The procedure may vary among health care providers and hospitals. What happens after the procedure?  Your blood pressure, heart rate, breathing rate, and blood oxygen level will be monitored until the medicines you were given have worn off.  Do not drive for 24 hours after the exam.  You may have a small amount of blood in your stool.  You may pass gas and have mild abdominal cramping or bloating due to the air that was used to inflate your colon during the exam.  It is up to you to get the results of your procedure. Ask your health care provider, or the department performing the procedure, when your results will be ready. This information is not intended to replace advice given to you by your health care provider. Make sure you discuss any questions you have with your health care provider. Document Released: 10/16/2000 Document Revised: 08/19/2016 Document Reviewed: 12/31/2015 Elsevier Interactive Patient Education  2018 [employer].

## 2017-08-27 ENCOUNTER — Encounter: Payer: Self-pay | Admitting: Gastroenterology

## 2017-08-28 LAB — COMPREHENSIVE METABOLIC PANEL
A/G RATIO: 1.2 (ref 1.2–2.2)
ALK PHOS: 231 IU/L — AB (ref 39–117)
ALT: 33 IU/L (ref 0–44)
AST: 25 IU/L (ref 0–40)
Albumin: 4.1 g/dL (ref 3.6–4.8)
BILIRUBIN TOTAL: 0.9 mg/dL (ref 0.0–1.2)
BUN/Creatinine Ratio: 9 — ABNORMAL LOW (ref 10–24)
BUN: 9 mg/dL (ref 8–27)
CO2: 21 mmol/L (ref 20–29)
Calcium: 9.4 mg/dL (ref 8.6–10.2)
Chloride: 100 mmol/L (ref 96–106)
Creatinine, Ser: 1.03 mg/dL (ref 0.76–1.27)
GFR calc Af Amer: 90 mL/min/{1.73_m2} (ref >59)
GFR calc non Af Amer: 78 mL/min/{1.73_m2} (ref >59)
GLOBULIN, TOTAL: 3.4 g/dL (ref 1.5–4.5)
Glucose: 90 mg/dL (ref 65–99)
POTASSIUM: 4.4 mmol/L (ref 3.5–5.2)
SODIUM: 138 mmol/L (ref 134–144)
Total Protein: 7.5 g/dL (ref 6.0–8.5)

## 2017-08-28 LAB — CBC WITH DIFFERENTIAL/PLATELET
BASOS ABS: 0.2 10*3/uL (ref 0.0–0.2)
Basos: 2 %
EOS (ABSOLUTE): 0.4 10*3/uL (ref 0.0–0.4)
Eos: 3 %
Hematocrit: 39.4 % (ref 37.5–51.0)
Hemoglobin: 12.7 g/dL — ABNORMAL LOW (ref 13.0–17.7)
LYMPHS ABS: 4.8 10*3/uL — AB (ref 0.7–3.1)
Lymphs: 40 %
MCH: 29 pg (ref 26.6–33.0)
MCHC: 32.2 g/dL (ref 31.5–35.7)
MCV: 90 fL (ref 79–97)
MONOS ABS: 1.3 10*3/uL — AB (ref 0.1–0.9)
Monocytes: 11 %
NEUTROS ABS: 4.9 10*3/uL (ref 1.4–7.0)
NEUTROS PCT: 35 %
PLATELETS: 349 10*3/uL (ref 150–379)
RBC: 4.38 x10E6/uL (ref 4.14–5.80)
RDW: 14.8 % (ref 12.3–15.4)
WBC: 11.9 10*3/uL — AB (ref 3.4–10.8)

## 2017-08-28 LAB — IMMATURE CELLS
BANDS PCT: 6 %
METAMYELOCYTES: 3 % — AB (ref 0–0)

## 2017-08-28 LAB — LIPASE: Lipase: 127 U/L — ABNORMAL HIGH (ref 13–78)

## 2017-08-30 ENCOUNTER — Telehealth (INDEPENDENT_AMBULATORY_CARE_PROVIDER_SITE_OTHER): Payer: Self-pay

## 2017-08-30 ENCOUNTER — Other Ambulatory Visit (INDEPENDENT_AMBULATORY_CARE_PROVIDER_SITE_OTHER): Payer: Self-pay | Admitting: Physician Assistant

## 2017-08-30 DIAGNOSIS — R748 Abnormal levels of other serum enzymes: Secondary | ICD-10-CM

## 2017-08-30 DIAGNOSIS — R7989 Other specified abnormal findings of blood chemistry: Secondary | ICD-10-CM

## 2017-08-30 NOTE — Telephone Encounter (Signed)
-----   Message from [name], PA-C sent at 08/30/2017  8:59 AM EDT ----- Mild pancreatitis still, hydrate properly with water, retest in 72 hours. Keep watch for fever, chills, nausea, vomiting, increasing abdominal pain, blood pressure dysregulation, and lower/no production of urine.   He is also found to have some immature cells circulating in the blood stream. He should have specialized testing at hematology to help rule out CML (type of leukemia). I will send to hematologist.

## 2017-08-30 NOTE — Telephone Encounter (Signed)
Patient aware. [name], CMA  

## 2017-08-30 NOTE — Telephone Encounter (Signed)
Patient  Sister who is is POA are aware of lab results. Will come into office in 72 hours for retest and will await phone call from hematology. Maryjean Morn, CMA

## 2017-09-01 ENCOUNTER — Other Ambulatory Visit (INDEPENDENT_AMBULATORY_CARE_PROVIDER_SITE_OTHER): Payer: Medicaid Other

## 2017-09-01 DIAGNOSIS — R748 Abnormal levels of other serum enzymes: Secondary | ICD-10-CM

## 2017-09-01 NOTE — Addendum Note (Signed)
Addended by: Maryjean Morn on: 09/01/2017 12:32 PM   Modules accepted: Orders

## 2017-09-02 ENCOUNTER — Ambulatory Visit
Payer: Medicaid Other | Attending: Student in an Organized Health Care Education/Training Program | Admitting: Student in an Organized Health Care Education/Training Program

## 2017-09-02 ENCOUNTER — Encounter: Payer: Self-pay | Admitting: Student in an Organized Health Care Education/Training Program

## 2017-09-02 ENCOUNTER — Telehealth (INDEPENDENT_AMBULATORY_CARE_PROVIDER_SITE_OTHER): Payer: Self-pay

## 2017-09-02 VITALS — BP 119/62 | HR 86 | Temp 98.1°F | Resp 18 | Ht 73.0 in | Wt 196.0 lb

## 2017-09-02 DIAGNOSIS — Z8739 Personal history of other diseases of the musculoskeletal system and connective tissue: Secondary | ICD-10-CM

## 2017-09-02 DIAGNOSIS — E7800 Pure hypercholesterolemia, unspecified: Secondary | ICD-10-CM | POA: Diagnosis not present

## 2017-09-02 DIAGNOSIS — G894 Chronic pain syndrome: Secondary | ICD-10-CM | POA: Diagnosis not present

## 2017-09-02 DIAGNOSIS — I5042 Chronic combined systolic (congestive) and diastolic (congestive) heart failure: Secondary | ICD-10-CM | POA: Insufficient documentation

## 2017-09-02 DIAGNOSIS — Z823 Family history of stroke: Secondary | ICD-10-CM | POA: Diagnosis not present

## 2017-09-02 DIAGNOSIS — M069 Rheumatoid arthritis, unspecified: Secondary | ICD-10-CM | POA: Insufficient documentation

## 2017-09-02 DIAGNOSIS — M25542 Pain in joints of left hand: Secondary | ICD-10-CM

## 2017-09-02 DIAGNOSIS — M170 Bilateral primary osteoarthritis of knee: Secondary | ICD-10-CM | POA: Insufficient documentation

## 2017-09-02 DIAGNOSIS — I252 Old myocardial infarction: Secondary | ICD-10-CM | POA: Diagnosis not present

## 2017-09-02 DIAGNOSIS — J849 Interstitial pulmonary disease, unspecified: Secondary | ICD-10-CM | POA: Diagnosis not present

## 2017-09-02 DIAGNOSIS — M25562 Pain in left knee: Secondary | ICD-10-CM | POA: Diagnosis not present

## 2017-09-02 DIAGNOSIS — I110 Hypertensive heart disease with heart failure: Secondary | ICD-10-CM | POA: Diagnosis not present

## 2017-09-02 DIAGNOSIS — Z8249 Family history of ischemic heart disease and other diseases of the circulatory system: Secondary | ICD-10-CM | POA: Insufficient documentation

## 2017-09-02 DIAGNOSIS — I714 Abdominal aortic aneurysm, without rupture: Secondary | ICD-10-CM | POA: Diagnosis not present

## 2017-09-02 DIAGNOSIS — G629 Polyneuropathy, unspecified: Secondary | ICD-10-CM | POA: Diagnosis not present

## 2017-09-02 DIAGNOSIS — F17210 Nicotine dependence, cigarettes, uncomplicated: Secondary | ICD-10-CM | POA: Insufficient documentation

## 2017-09-02 DIAGNOSIS — M25541 Pain in joints of right hand: Secondary | ICD-10-CM | POA: Insufficient documentation

## 2017-09-02 DIAGNOSIS — M25511 Pain in right shoulder: Secondary | ICD-10-CM | POA: Insufficient documentation

## 2017-09-02 DIAGNOSIS — Z79899 Other long term (current) drug therapy: Secondary | ICD-10-CM | POA: Diagnosis not present

## 2017-09-02 DIAGNOSIS — M25561 Pain in right knee: Secondary | ICD-10-CM | POA: Insufficient documentation

## 2017-09-02 DIAGNOSIS — Z8673 Personal history of transient ischemic attack (TIA), and cerebral infarction without residual deficits: Secondary | ICD-10-CM | POA: Insufficient documentation

## 2017-09-02 DIAGNOSIS — I2510 Atherosclerotic heart disease of native coronary artery without angina pectoris: Secondary | ICD-10-CM | POA: Diagnosis not present

## 2017-09-02 DIAGNOSIS — M25512 Pain in left shoulder: Secondary | ICD-10-CM | POA: Diagnosis not present

## 2017-09-02 DIAGNOSIS — J9601 Acute respiratory failure with hypoxia: Secondary | ICD-10-CM | POA: Diagnosis not present

## 2017-09-02 DIAGNOSIS — Z7982 Long term (current) use of aspirin: Secondary | ICD-10-CM | POA: Insufficient documentation

## 2017-09-02 DIAGNOSIS — Z951 Presence of aortocoronary bypass graft: Secondary | ICD-10-CM | POA: Insufficient documentation

## 2017-09-02 LAB — LIPASE: Lipase: 67 U/L (ref 13–78)

## 2017-09-02 MED ORDER — HYDROCODONE-ACETAMINOPHEN 7.5-325 MG PO TABS: 1.0000 | ORAL_TABLET | Freq: Two times a day (BID) | ORAL | 0 refills | Status: DC | PRN

## 2017-09-02 NOTE — Progress Notes (Signed)
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  10/29/2017 2:00 PM Armbruster, Carlota Raspberry, MD [facility]  11/18/2017 12:00 PM Gillis Santa, MD [facility] None  Primary Care Physician: [name], PA-C Location: [facility] Note by: Gillis Santa, M.D Date: 09/02/2017; Time: 2:51 PM  Patient Instructions  1. Repeat left genicular nerve block with steroid 2. Increase Hydrocodone to 7.5 mg twice daily as needed, prescription provided for 2 months 3. Continue all other meds  Do not eat 8 hour before your procedure. You may have clear liquids 3 hours before your procedure. You must have a driver.

## 2017-09-02 NOTE — Telephone Encounter (Signed)
Patients sister aware of normal lipase. Maryjean Morn, CMA

## 2017-09-02 NOTE — Patient Instructions (Addendum)
1. Repeat left genicular nerve block with steroid 2. Increase Hydrocodone to 7.5 mg twice daily as needed, prescription provided for 2 months 3. Continue all other meds  Do not eat 8 hour before your procedure. You may have clear liquids 3 hours before your procedure. You must have a driver.

## 2017-09-02 NOTE — Progress Notes (Signed)
Nursing Pain Medication Assessment:  Safety precautions to be maintained throughout the outpatient stay will include: orient to surroundings, keep bed in low position, maintain call bell within reach at all times, provide assistance with transfer out of bed and ambulation.  Medication Inspection Compliance: Pill count conducted under aseptic conditions, in front of the patient. Neither the pills nor the bottle was removed from the patient's sight at any time. Once count was completed pills were immediately returned to the patient in their original bottle.  Medication: Hydrocodone/APAP Pill/Patch Count: 48 of 60 pills remain Pill/Patch Appearance: Markings consistent with prescribed medication Bottle Appearance: Standard pharmacy container. Clearly labeled. Filled Date: 10/28 / 2018 Last Medication intake:  Today

## 2017-09-02 NOTE — Telephone Encounter (Signed)
-----   Message from [name], PA-C sent at 09/02/2017  8:45 AM EDT ----- Lipase has returned to normal.

## 2017-09-07 ENCOUNTER — Encounter: Payer: Medicaid Other | Admitting: Student in an Organized Health Care Education/Training Program

## 2017-09-08 ENCOUNTER — Telehealth: Payer: Self-pay | Admitting: Internal Medicine

## 2017-09-08 ENCOUNTER — Ambulatory Visit (INDEPENDENT_AMBULATORY_CARE_PROVIDER_SITE_OTHER)
Admission: RE | Admit: 2017-09-08 | Discharge: 2017-09-08 | Disposition: A | Discharge status: Home / Self Care | Payer: Medicaid Other | Source: Ambulatory Visit | Attending: Internal Medicine | Admitting: Internal Medicine

## 2017-09-08 DIAGNOSIS — J181 Lobar pneumonia, unspecified organism: Secondary | ICD-10-CM | POA: Diagnosis not present

## 2017-09-08 NOTE — Telephone Encounter (Signed)
Pt wanted to get his xray done here but the staff told him that he would have to get a [facility] [place] doctor to place the order. I spoke with Florentina Addison and she stated that I could put the order in MR name but note to send to Sindy Messing. I advised pt. Nothing further is needed.

## 2017-09-13 ENCOUNTER — Other Ambulatory Visit: Payer: Self-pay

## 2017-09-13 ENCOUNTER — Ambulatory Visit
Admission: RE | Admit: 2017-09-13 | Discharge: 2017-09-13 | Disposition: A | Discharge status: Home / Self Care | Payer: Medicaid Other | Source: Ambulatory Visit | Attending: Student in an Organized Health Care Education/Training Program | Admitting: Student in an Organized Health Care Education/Training Program

## 2017-09-13 ENCOUNTER — Encounter: Payer: Self-pay | Admitting: Student in an Organized Health Care Education/Training Program

## 2017-09-13 ENCOUNTER — Ambulatory Visit: Payer: Medicaid Other | Admitting: Student in an Organized Health Care Education/Training Program

## 2017-09-13 VITALS — BP 146/95 | HR 87 | Temp 98.4°F | Resp 17 | Ht 73.0 in | Wt 196.0 lb

## 2017-09-13 DIAGNOSIS — Z8739 Personal history of other diseases of the musculoskeletal system and connective tissue: Secondary | ICD-10-CM

## 2017-09-13 DIAGNOSIS — M170 Bilateral primary osteoarthritis of knee: Secondary | ICD-10-CM | POA: Insufficient documentation

## 2017-09-13 DIAGNOSIS — M25562 Pain in left knee: Secondary | ICD-10-CM

## 2017-09-13 DIAGNOSIS — M25561 Pain in right knee: Secondary | ICD-10-CM

## 2017-09-13 MED ORDER — LACTATED RINGERS IV SOLN
1000.0000 mL | Freq: Once | INTRAVENOUS | Status: AC
  Administered 2017-09-13: 1000 mL via INTRAVENOUS

## 2017-09-13 MED ORDER — DEXAMETHASONE SODIUM PHOSPHATE 10 MG/ML IJ SOLN
10.0000 mg | Freq: Once | INTRAMUSCULAR | Status: AC
  Administered 2017-09-13: 10 mg

## 2017-09-13 MED ORDER — LIDOCAINE HCL (PF) 1 % IJ SOLN
INTRAMUSCULAR | Status: AC
  Filled 2017-09-13: qty 5

## 2017-09-13 MED ORDER — ROPIVACAINE HCL 2 MG/ML IJ SOLN
INTRAMUSCULAR | Status: AC
  Filled 2017-09-13: qty 10

## 2017-09-13 MED ORDER — FENTANYL CITRATE (PF) 100 MCG/2ML IJ SOLN
INTRAMUSCULAR | Status: AC
  Filled 2017-09-13: qty 2

## 2017-09-13 MED ORDER — DEXAMETHASONE SODIUM PHOSPHATE 10 MG/ML IJ SOLN
INTRAMUSCULAR | Status: AC
  Filled 2017-09-13: qty 1

## 2017-09-13 MED ORDER — FENTANYL CITRATE (PF) 100 MCG/2ML IJ SOLN
25.0000 ug | INTRAMUSCULAR | Status: DC | PRN
  Administered 2017-09-13: 50 ug via INTRAVENOUS

## 2017-09-13 MED ORDER — ROPIVACAINE HCL 2 MG/ML IJ SOLN
9.0000 mL | Freq: Once | INTRAMUSCULAR | Status: AC
  Administered 2017-09-13: 10 mL

## 2017-09-13 MED ORDER — LIDOCAINE HCL (PF) 1 % IJ SOLN
10.0000 mL | Freq: Once | INTRAMUSCULAR | Status: AC
  Administered 2017-09-13: 5 mL

## 2017-09-13 NOTE — Patient Instructions (Signed)

## 2017-09-13 NOTE — Progress Notes (Signed)
Patient's Name: [name]  MRN: [id number]  Referring Provider: Edward Jolly, MD  DOB: Jun 14, 1956  PCP: [name], Cordelia Poche  DOS: 09/13/2017  Note by: Edward Jolly, MD  Service setting: Ambulatory outpatient  Specialty: Interventional Pain Management  Patient type: Established  Location: [facility] (AMB) Pain Management Facility  Visit type: Interventional Procedure   Primary Reason for Visit: Interventional Pain Management Treatment. CC: Knee Pain (left)  Procedure:  Anesthesia, Analgesia, Anxiolysis:  Type: Therapeutic Superior-lateral, Superior-medial, and Inferior-medial, Genicular Nerves Block. (CPT [id number]) Region: Lateral, Anterior, and Medial aspects of the knee joint, above and below the knee joint proper. Level: Superior and inferior to the knee joint. Laterality: Left  Type: Local Anesthesia with Moderate (Conscious) Sedation Local Anesthetic: Lidocaine 1% Route: Intravenous (IV) IV Access: Secured Sedation: Meaningful verbal contact was maintained at all times during the procedure  Indication(s): Analgesia and Anxiety   Indications: 1. Primary osteoarthritis of both knees   2. Hx of rheumatoid arthritis   3. Arthralgia of both knees    Pain Score: Pre-procedure: 7 /10 Post-procedure: 0-No pain/10  Pre-op Assessment:  [name] is a 61 y.o. (year old), male patient, seen today for interventional treatment. He  has a past surgical history that includes Cardiac catheterization; Carotid endarterectomy (Left); biopsy; and Left Heart Cath and Cors/Grafts Angiography (N/A, 03/01/2017). [name] has a current medication list which includes the following prescription(s): acetaminophen, albuterol, aspirin, atorvastatin, carvedilol, gabapentin, hydrocodone-acetaminophen, lisinopril, menthol-methyl salicylate, oxygen-helium, polyethylene glycol, prednisone, and spironolactone, and the following Facility-Administered Medications: fentanyl. His primarily concern today is the Knee Pain  (left)  Initial Vital Signs: Blood pressure (!) 147/95, pulse 87, temperature 97.9 F (36.6 C), temperature source Oral, resp. rate 20, height 6\' 1"  (1.854 m), weight 196 lb (88.9 kg), SpO2 100 %. BMI: Estimated body mass index is 25.86 kg/m as calculated from the following:   Height as of this encounter: 6\' 1"  (1.854 m).   Weight as of this encounter: 196 lb (88.9 kg).  Risk Assessment: Allergies: Reviewed. He has No Known Allergies.  Allergy Precautions: None required Coagulopathies: Reviewed. None identified.  Blood-thinner therapy: None at this time Active Infection(s): Reviewed. None identified. [name] is afebrile  Site Confirmation: [name] was asked to confirm the procedure and laterality before marking the site Procedure checklist: Completed Consent: Before the procedure and under the influence of no sedative(s), amnesic(s), or anxiolytics, the patient was informed of the treatment options, risks and possible complications. To fulfill our ethical and legal obligations, as recommended by the American Medical Association's Code of Ethics, I have informed the patient of my clinical impression; the nature and purpose of the treatment or procedure; the risks, benefits, and possible complications of the intervention; the alternatives, including doing nothing; the risk(s) and benefit(s) of the alternative treatment(s) or procedure(s); and the risk(s) and benefit(s) of doing nothing. The patient was provided information about the general risks and possible complications associated with the procedure. These may include, but are not limited to: failure to achieve desired goals, infection, bleeding, organ or nerve damage, allergic reactions, paralysis, and death. In addition, the patient was informed of those risks and complications associated to the procedure, such as failure to decrease pain; infection; bleeding; organ or nerve damage with subsequent damage to sensory, motor, and/or autonomic  systems, resulting in permanent pain, numbness, and/or weakness of one or several areas of the body; allergic reactions; (i.e.: anaphylactic reaction); and/or death. Furthermore, the patient was informed of those risks and complications associated  with the medications. These include, but are not limited to: allergic reactions (i.e.: anaphylactic or anaphylactoid reaction(s)); adrenal axis suppression; blood sugar elevation that in diabetics may result in ketoacidosis or comma; water retention that in patients with history of congestive heart failure may result in shortness of breath, pulmonary edema, and decompensation with resultant heart failure; weight gain; swelling or edema; medication-induced neural toxicity; particulate matter embolism and blood vessel occlusion with resultant organ, and/or nervous system infarction; and/or aseptic necrosis of one or more joints. Finally, the patient was informed that Medicine is not an exact science; therefore, there is also the possibility of unforeseen or unpredictable risks and/or possible complications that may result in a catastrophic outcome. The patient indicated having understood very clearly. We have given the patient no guarantees and we have made no promises. Enough time was given to the patient to ask questions, all of which were answered to the patient's satisfaction. [name] has indicated that he wanted to continue with the procedure. Attestation: I, the ordering provider, attest that I have discussed with the patient the benefits, risks, side-effects, alternatives, likelihood of achieving goals, and potential problems during recovery for the procedure that I have provided informed consent. Date: 09/13/2017; Time: 12:36 PM  Pre-Procedure Preparation:  Monitoring: As per clinic protocol. Respiration, ETCO2, SpO2, BP, heart rate and rhythm monitor placed and checked for adequate function Safety Precautions: Patient was assessed for positional comfort  and pressure points before starting the procedure. Time-out: I initiated and conducted the "Time-out" before starting the procedure, as per protocol. The patient was asked to participate by confirming the accuracy of the "Time Out" information. Verification of the correct person, site, and procedure were performed and confirmed by me, the nursing staff, and the patient. "Time-out" conducted as per Joint Commission's Universal Protocol (UP.01.01.01). "Time-out" Date & Time: 09/13/2017; 1258 hrs.  Description of Procedure Process:  Position: Supine Target Area: For Genicular Nerve block(s), the targets are: the superior-lateral genicular nerve, located in the lateral distal portion of the femoral shaft as it curves to form the lateral epicondyle, in the region of the distal femoral metaphysis; the superior-medial genicular nerve, located in the medial distal portion of the femoral shaft as it curves to form the medial epicondyle; and the inferior-medial genicular nerve, located in the medial, proximal portion of the tibial shaft, as it curves to form the medial epicondyle, in the region of the proximal tibial metaphysis. Approach: Anterior, ipsilateral approach. Area Prepped: Entire knee area, from mid-thigh to mid-shin, lateral, anterior, and medial aspects. Prepping solution: ChloraPrep (2% chlorhexidine gluconate and 70% isopropyl alcohol) Safety Precautions: Aspiration looking for blood return was conducted prior to all injections. At no point did we inject any substances, as a needle was being advanced. No attempts were made at seeking any paresthesias. Safe injection practices and needle disposal techniques used. Medications properly checked for expiration dates. SDV (single dose vial) medications used. Latex Allergy precautions taken.   Description of the Procedure: Protocol guidelines were followed. The patient was placed in position over the procedure table. The target area was identified and the  area prepped in the usual manner. Skin desensitized using vapocoolant spray. Skin & deeper tissues infiltrated with local anesthetic. Appropriate amount of time allowed to pass for local anesthetics to take effect. The procedure needles were then advanced to the target area. Proper needle placement secured. Negative aspiration confirmed. Solution injected in intermittent fashion, asking for systemic symptoms every 0.5cc of injectate. The needles were then removed and  the area cleansed, making sure to leave some of the prepping solution back to take advantage of its long term bactericidal properties.  Vitals:   09/13/17 1310 09/13/17 1320 09/13/17 1330 09/13/17 1340  BP: (!) 160/113 (!) 158/96 (!) 145/95 (!) 146/95  Pulse:      Resp: 13 16 16 17   Temp:  98.4 F (36.9 C)    TempSrc:      SpO2: 100% 100% 100% 99%  Weight:      Height:        Start Time: 1258 hrs. End Time:   hrs. Materials:  Needle(s) Type: Regular needle Gauge: 22G Length: 3.5-in Medication(s): We administered lactated ringers, fentaNYL, ropivacaine (PF) 2 mg/mL (0.2%), lidocaine (PF), and dexamethasone. Please see chart orders for dosing details.  Imaging Guidance (Non-Spinal):  Type of Imaging Technique: Fluoroscopy Guidance (Non-Spinal) Indication(s): Assistance in needle guidance and placement for procedures requiring needle placement in or near specific anatomical locations not easily accessible without such assistance. Exposure Time: Please see nurses notes. Contrast: Before injecting any contrast, we confirmed that the patient did not have an allergy to iodine, shellfish, or radiological contrast. Once satisfactory needle placement was completed at the desired level, radiological contrast was injected. Contrast injected under live fluoroscopy. No contrast complications. See chart for type and volume of contrast used. Fluoroscopic Guidance: I was personally present during the use of fluoroscopy. "Tunnel Vision  Technique" used to obtain the best possible view of the target area. Parallax error corrected before commencing the procedure. "Direction-depth-direction" technique used to introduce the needle under continuous pulsed fluoroscopy. Once target was reached, antero-posterior, oblique, and lateral fluoroscopic projection used confirm needle placement in all planes. Images permanently stored in EMR. Interpretation: I personally interpreted the imaging intraoperatively. Adequate needle placement confirmed in multiple planes. Appropriate spread of contrast into desired area was observed. No evidence of afferent or efferent intravascular uptake. Permanent images saved into the patient's record. 5cc solution made of 4 cc of 0.2% Ropivacaine and 1 cc of Decadron 10mg /cc. Approx 1.7 cc injected at each target area above Antibiotic Prophylaxis:  Indication(s): None identified Antibiotic given: None  Post-operative Assessment:  EBL: None Complications: No immediate post-treatment complications observed by team, or reported by patient. Note: The patient tolerated the entire procedure well. A repeat set of vitals were taken after the procedure and the patient was kept under observation following institutional policy, for this type of procedure. Post-procedural neurological assessment was performed, showing return to baseline, prior to discharge. The patient was provided with post-procedure discharge instructions, including a section on how to identify potential problems. Should any problems arise concerning this procedure, the patient was given instructions to immediately contact , at any time, without hesitation. In any case, we plan to contact the patient by telephone for a follow-up status report regarding this interventional procedure. Comments:  No additional relevant information. LE strength at baseline Plan of Care   Imaging Orders     DG C-Arm 1-60 Min-No Report Procedure Orders    No procedure(s)  ordered today   4 weeks for PP evaluation  Medications ordered for procedure: Meds ordered this encounter  Medications  . lactated ringers infusion 1,000 mL  . fentaNYL (SUBLIMAZE) injection 25-100 mcg    Make sure Narcan is available in the pyxis when using this medication. In the event of respiratory depression (RR< 8/min): Titrate NARCAN (naloxone) in increments of 0.1 to 0.2 mg IV at 2-3 minute intervals, until desired degree of reversal.  . ropivacaine (PF)  2 mg/mL (0.2%) (NAROPIN) injection 9 mL  . lidocaine (PF) (XYLOCAINE) 1 % injection 10 mL  . dexamethasone (DECADRON) injection 10 mg   Medications administered: We administered lactated ringers, fentaNYL, ropivacaine (PF) 2 mg/mL (0.2%), lidocaine (PF), and dexamethasone.  See the medical record for exact dosing, route, and time of administration.  Disposition: Discharge home  Discharge Date & Time: 09/13/2017; 1342 hrs.   Physician-requested Follow-up: Return in about 4 weeks (around 10/11/2017) for Post Procedure Evaluation. Future Appointments  Date Time Provider Department Center  09/14/2017  2:00 PM Pollyann SamplesBurton, Lacie K, NP [facility] None  09/27/2017 11:00 AM [facility] PFT ROOM [facility] None  09/27/2017 11:30 AM Kalman Shanamaswamy, Murali, MD [facility] None  10/12/2017  9:30 AM Edward JollyLateef, Torrence Hammack, MD [facility] None  10/29/2017  2:00 PM Armbruster, Willaim RayasSteven P, MD [facility]  11/18/2017 12:00 PM Edward JollyLateef, Silver Parkey, MD [facility] None   Primary Care Physician: Loletta SpecterGomez, Roger David, PA-C [facility] Note by: Edward JollyBilal Adyn Serna, MD Date: 09/13/2017; Time: 2:22 PM  Disclaimer:  Medicine is not an exact science. The only guarantee in medicine is that nothing is guaranteed. It is important to note that the decision to proceed with this intervention was based on the information collected from the patient. The Data and conclusions were drawn from the patient's questionnaire, the interview, and the  physical examination. Because the information was provided in large part by the patient, it cannot be guaranteed that it has not been purposely or unconsciously manipulated. Every effort has been made to obtain as much relevant data as possible for this evaluation. It is important to note that the conclusions that lead to this procedure are derived in large part from the available data. Always take into account that the treatment will also be dependent on availability of resources and existing treatment guidelines, considered by other Pain Management Practitioners as being common knowledge and practice, at the time of the intervention. For Medico-Legal purposes, it is also important to point out that variation in procedural techniques and pharmacological choices are the acceptable norm. The indications, contraindications, technique, and results of the above procedure should only be interpreted and judged by a Board-Certified Interventional Pain Specialist with extensive familiarity and expertise in the same exact procedure and technique.

## 2017-09-13 NOTE — Progress Notes (Signed)
Safety precautions to be maintained throughout the outpatient stay will include: orient to surroundings, keep bed in low position, maintain call bell within reach at all times, provide assistance with transfer out of bed and ambulation.  

## 2017-09-14 ENCOUNTER — Ambulatory Visit (HOSPITAL_BASED_OUTPATIENT_CLINIC_OR_DEPARTMENT_OTHER): Payer: Medicaid Other | Admitting: Nurse Practitioner

## 2017-09-14 ENCOUNTER — Encounter: Payer: Self-pay | Admitting: Nurse Practitioner

## 2017-09-14 ENCOUNTER — Ambulatory Visit (HOSPITAL_BASED_OUTPATIENT_CLINIC_OR_DEPARTMENT_OTHER): Payer: Medicaid Other

## 2017-09-14 ENCOUNTER — Telehealth: Payer: Self-pay | Admitting: Hematology

## 2017-09-14 VITALS — BP 142/88 | HR 85 | Temp 98.1°F | Resp 18 | Ht 73.0 in | Wt 196.5 lb

## 2017-09-14 DIAGNOSIS — D72829 Elevated white blood cell count, unspecified: Secondary | ICD-10-CM

## 2017-09-14 DIAGNOSIS — D649 Anemia, unspecified: Secondary | ICD-10-CM

## 2017-09-14 LAB — CBC WITH DIFFERENTIAL/PLATELET
BASO%: 0.1 % (ref 0.0–2.0)
BASOS ABS: 0 10*3/uL (ref 0.0–0.1)
EOS%: 0.1 % (ref 0.0–7.0)
Eosinophils Absolute: 0 10*3/uL (ref 0.0–0.5)
HCT: 42.4 % (ref 38.4–49.9)
HGB: 14.4 g/dL (ref 13.0–17.1)
LYMPH#: 1.9 10*3/uL (ref 0.9–3.3)
LYMPH%: 15 % (ref 14.0–49.0)
MCH: 30.7 pg (ref 27.2–33.4)
MCHC: 34 g/dL (ref 32.0–36.0)
MCV: 90.4 fL (ref 79.3–98.0)
MONO#: 0.8 10*3/uL (ref 0.1–0.9)
MONO%: 6.6 % (ref 0.0–14.0)
NEUT#: 9.9 10*3/uL — ABNORMAL HIGH (ref 1.5–6.5)
NEUT%: 78.2 % — AB (ref 39.0–75.0)
Platelets: 178 10*3/uL (ref 140–400)
RBC: 4.69 10*6/uL (ref 4.20–5.82)
RDW: 16.6 % — ABNORMAL HIGH (ref 11.0–14.6)
WBC: 12.6 10*3/uL — ABNORMAL HIGH (ref 4.0–10.3)
nRBC: 0 % (ref 0–0)

## 2017-09-14 LAB — CHCC SMEAR
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
## 2017-09-14 NOTE — Telephone Encounter (Signed)
Gave avs and calendar for February 2019 °

## 2017-09-15 ENCOUNTER — Telehealth (INDEPENDENT_AMBULATORY_CARE_PROVIDER_SITE_OTHER): Payer: Self-pay | Admitting: Physician Assistant

## 2017-09-15 ENCOUNTER — Ambulatory Visit: Payer: Medicaid Other | Admitting: Student in an Organized Health Care Education/Training Program

## 2017-09-15 NOTE — Telephone Encounter (Signed)
Pulmonary fibrosis unchanged. Please make sure this person is on the authorized list of people to divulge results to. Thank you.

## 2017-09-15 NOTE — Telephone Encounter (Signed)
Patient's sister called to let you know that Mr milosevic had the chest x rays on 09-08-17 Dr [name] create a new order  and if you can please, check the results and let her know thank you

## 2017-09-15 NOTE — Telephone Encounter (Signed)
Left sister a message informing of pulmonary fibrosis is unchanged and to call office with any questions. Maryjean Morn, CMA

## 2017-09-20 ENCOUNTER — Encounter: Payer: Medicaid Other | Admitting: Gastroenterology

## 2017-09-21 ENCOUNTER — Telehealth: Payer: Self-pay | Admitting: Nurse Practitioner

## 2017-09-21 NOTE — Telephone Encounter (Signed)
After review with Dr. Mosetta Putt, I called patient to inform him BCR/ABL test ruled out CML. His sister took the call and verbalizes understanding, no CML. Patient will f/u in 3 months then PRN if labs are stable.

## 2017-09-27 ENCOUNTER — Ambulatory Visit (INDEPENDENT_AMBULATORY_CARE_PROVIDER_SITE_OTHER): Payer: Medicaid Other | Admitting: Internal Medicine

## 2017-09-27 ENCOUNTER — Encounter: Payer: Self-pay | Admitting: Internal Medicine

## 2017-09-27 VITALS — BP 140/84 | HR 85 | Ht 73.0 in | Wt 200.6 lb

## 2017-09-27 DIAGNOSIS — F17200 Nicotine dependence, unspecified, uncomplicated: Secondary | ICD-10-CM

## 2017-09-27 DIAGNOSIS — Z23 Encounter for immunization: Secondary | ICD-10-CM | POA: Diagnosis not present

## 2017-09-27 DIAGNOSIS — J849 Interstitial pulmonary disease, unspecified: Secondary | ICD-10-CM | POA: Diagnosis not present

## 2017-09-27 DIAGNOSIS — R911 Solitary pulmonary nodule: Secondary | ICD-10-CM

## 2017-09-27 DIAGNOSIS — R7989 Other specified abnormal findings of blood chemistry: Secondary | ICD-10-CM | POA: Diagnosis not present

## 2017-09-27 DIAGNOSIS — R768 Other specified abnormal immunological findings in serum: Secondary | ICD-10-CM

## 2017-09-27 NOTE — Patient Instructions (Addendum)
ICD-10-CM   1. ILD (interstitial lung disease) (HCC) J84.9   2. 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 Solitary pulmonary nodule R91.1   4. Current smoker F17.200   5. Flu vaccine need Z23      Cyclic citrullinated peptide (CCP) antibody positive ILD (interstitial lung disease) (HCC)   - Your pulmonary fibrosis is due to Rheumatoid +/- Smoking +/- mold - you are high risk for progression but bettween April 2018 and July 2018 and Nov 2018  - lungs are stable baesd on symptoms and breathing tests and walk test  - I do not recommend biipsy for you for now - I recommend stopping smoking as firs step.  - I will talk to Dr Tresa Muse again. Not sure why immuran not started yet but maybe with all your issues going on hold off starting immuran - idea is to prevent progression but cannot be guaranteed - continue o2 at home at night - flu shot 09/27/2017   Solitary pulmonary nodule 31mm RUL nodule April 2018 No report of nodule Oct 2018  Plan  -expectant followupo   Current smoker Try to quit ASAP  Followup In 3 months do Pre-bd spiro and dlco only. No lung volume or bd response. 3 months or sooner if needed
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
## 2017-09-28 ENCOUNTER — Telehealth: Payer: Self-pay | Admitting: Internal Medicine

## 2017-09-28 DIAGNOSIS — J849 Interstitial pulmonary disease, unspecified: Secondary | ICD-10-CM

## 2017-09-28 NOTE — Telephone Encounter (Signed)
Pt's wife Olegario Messier aware of the order placed for a neb machine to [facility]. Nothing further needed.

## 2017-09-28 NOTE — Telephone Encounter (Signed)
Ok for [employer]

## 2017-09-28 NOTE — Telephone Encounter (Signed)
Called and spoke with pts significant other and she stated that the pt has the nebulizer meds but not the nebulizer machine.  MR please advise if ok to send this in to [facility] for the pt.  Thanks

## 2017-10-12 ENCOUNTER — Ambulatory Visit: Payer: Medicaid Other | Admitting: Student in an Organized Health Care Education/Training Program

## 2017-10-29 ENCOUNTER — Other Ambulatory Visit (INDEPENDENT_AMBULATORY_CARE_PROVIDER_SITE_OTHER): Payer: Medicaid Other

## 2017-10-29 ENCOUNTER — Encounter: Payer: Self-pay | Admitting: Gastroenterology

## 2017-10-29 ENCOUNTER — Ambulatory Visit (INDEPENDENT_AMBULATORY_CARE_PROVIDER_SITE_OTHER): Payer: Medicaid Other | Admitting: Gastroenterology

## 2017-10-29 VITALS — BP 108/80 | HR 100 | Ht 70.5 in | Wt 207.2 lb

## 2017-10-29 DIAGNOSIS — R945 Abnormal results of liver function studies: Secondary | ICD-10-CM

## 2017-10-29 DIAGNOSIS — Z8719 Personal history of other diseases of the digestive system: Secondary | ICD-10-CM

## 2017-10-29 DIAGNOSIS — R7989 Other specified abnormal findings of blood chemistry: Secondary | ICD-10-CM

## 2017-10-29 DIAGNOSIS — K859 Acute pancreatitis without necrosis or infection, unspecified: Secondary | ICD-10-CM | POA: Diagnosis not present

## 2017-10-29 DIAGNOSIS — K824 Cholesterolosis of gallbladder: Secondary | ICD-10-CM

## 2017-10-29 DIAGNOSIS — Z9981 Dependence on supplemental oxygen: Secondary | ICD-10-CM | POA: Diagnosis not present

## 2017-10-29 DIAGNOSIS — Z1211 Encounter for screening for malignant neoplasm of colon: Secondary | ICD-10-CM

## 2017-10-29 LAB — HEPATIC FUNCTION PANEL
ALBUMIN: 4.4 g/dL (ref 3.5–5.2)
ALK PHOS: 88 U/L (ref 39–117)
ALT: 14 U/L (ref 0–53)
AST: 4 U/L (ref 0–37)
BILIRUBIN DIRECT: 0.2 mg/dL (ref 0.0–0.3)
TOTAL PROTEIN: 8.1 g/dL (ref 6.0–8.3)
Total Bilirubin: 0.9 mg/dL (ref 0.2–1.2)

## 2017-10-29 NOTE — Patient Instructions (Addendum)
If you are age 62 or older, your body mass index should be between 23-30. Your Body mass index is 29.32 kg/m. If this is out of the aforementioned range listed, please consider follow up with your Primary Care Provider.  If you are age 67 or younger, your body mass index should be between 19-25. Your Body mass index is 29.32 kg/m. If this is out of the aformentioned range listed, please consider follow up with your Primary Care Provider.   Please go to the lab in the basement of our building to have lab work done as you leave today for  LFT FIT stool test  Please follow up with your cardiologist.  Thank you for entrusting me with your care, Dr. Ileene Patrick

## 2017-10-29 NOTE — Progress Notes (Signed)
HPI :  61 y/o male with a history of CAD s/p CABG and, LHC on 01/2017 with occluded grafts, PVD s/p CEA on 11/2016, CHF (EF 35-40% on 02/26/17), HLD, history of NSVT , history of CVA, history of RA, here for a new patient consultation from Sindy Messing PA for pancreatitis and colon cancer screening.   Patient was admitted in October for suspected pancreatitis - AP of 657, AST, ALT and lipase of 949. CT showed changes of mild pancreatitis, no mass lesions of the pancreas. US showed suspected gallbladder polyps but no stones. He denies any alcohol use. This was the first time he is ever had pancreatitis. He denies any abdominal pain preceding this episode.[name] He's had normal triglycerides in the past. He had been placed on sulfasalazine within 6 months of this occurrence, it was questioned as to whether or not this caused his pancreatitis. His symptoms resolved with bowel rest, and he was given empiric antibiotics given fevers even though he did not have leukocytosis. His LFTs down trended. GI was not consulted and he did not have an MRCP.   He otherwise has never had a prior colonoscopy. He denies any blood in his stools. He has one bowel movement per day. He has no family history of colon cancer. He otherwise denies any other abdominal pain. He states is most recently had a stroke and heart attack in January of this past year. He has oxygen-dependent interstitial lung disease, wears 2 L oxygen at home when sleeping. He denies weight loss. He reports his breathing is at baseline and he denies any chest pains. He has not seen Cardiology since August.  Prior workup during inpatient stay: AP was 657, AST 108, ALT 61, T bil 1.2. GGT elevated to 200s  CT abdomen 08/08/17 - mild pancreatitis, partially thrombosed infrarenal AAA - measuring 4.2cm [place] 08/08/17 - probable GB polyps measuring 50mm in size, CBD 3.49mm in size, no overt gallstones  Past Medical History:  Diagnosis Date  . AAA (abdominal aortic aneurysm)  (HCC)   . Arthritis   . CAD in native artery    a. h/o MI x 3 s/p CABG x 4 (1993 with Tyrone Sage). b. LHC 01/2017 with severe native disease, 2 grafts occluded.  . Carotid artery disease (HCC)    a. s/p R CEA 11/2016.  [name] Chronic combined systolic and diastolic CHF (congestive heart failure) (HCC)   . GERD (gastroesophageal reflux disease)   . ILD (interstitial lung disease) (HCC)   . Lung nodule   . MI (myocardial infarction) (HCC)    x 3   . Normocytic anemia   . NSVT (nonsustained ventricular tachycardia) (HCC)   . RA (rheumatoid arthritis) (HCC)   . Stroke (HCC)   . Tobacco abuse      Past Surgical History:  Procedure Laterality Date  . CARDIAC CATHETERIZATION    . CAROTID ENDARTERECTOMY Left   . Heart Bypass  10/1992  . LEFT HEART CATH AND CORS/GRAFTS ANGIOGRAPHY N/A 03/01/2017   Procedure: Left Heart Cath and Cors/Grafts Angiography;  Surgeon: Lyn Records, MD;  Location: [facility] INVASIVE CV LAB;  Service: Cardiovascular;  Laterality: N/A;  . Throat Biopsy     Cat scratch fever   Family History  Problem Relation Age of Onset  . Heart murmur Mother   . Heart disease Mother   . Heart attack Mother   . Suicidality Father   . Heart attack Father   . Stroke Brother   . Heart attack Brother   .  Heart attack Maternal Grandmother   . Cancer Maternal Grandmother        unknown type  . Lung cancer Maternal Aunt   . Lung cancer Maternal Uncle   . Cancer Maternal Aunt        unknown cancer  . Heart attack Brother    Social History   Tobacco Use  . Smoking status: Current Every Day Smoker    Packs/day: 0.50    Years: 47.00    Pack years: 23.50    Types: Cigarettes  . Smokeless tobacco: Never Used  Substance Use Topics  . Alcohol use: No    Comment: remote history of beer drinking  . Drug use: No   Current Outpatient Medications  Medication Sig Dispense Refill  . acetaminophen (TYLENOL) 500 MG tablet Take 1,000 mg by mouth every 6 (six) hours as needed for mild pain.       [name] albuterol (ACCUNEB) 0.63 MG/3ML nebulizer solution Take 3 mLs by nebulization every 6 (six) hours as needed for wheezing.  12  . albuterol (PROVENTIL HFA;VENTOLIN HFA) 108 (90 Base) MCG/ACT inhaler Inhale 2 puffs into the lungs every 6 (six) hours as needed for wheezing or shortness of breath. 1 Inhaler 6  . aspirin 81 MG chewable tablet Chew 1 tablet (81 mg total) by mouth daily. 30 tablet 0  . atorvastatin (LIPITOR) 40 MG tablet Take 1 tablet (40 mg total) by mouth daily at 6 PM. 30 tablet 11  . carvedilol (COREG) 6.25 MG tablet Take 1 tablet (6.25 mg total) by mouth 2 (two) times daily with a meal. 60 tablet 11  . gabapentin (NEURONTIN) 300 MG capsule Take 1 capsule (300 mg total) by mouth every 8 (eight) hours. 90 capsule 2  . HYDROcodone-acetaminophen (NORCO) 7.5-325 MG tablet Take 1 tablet by mouth 2 (two) times daily as needed for moderate pain. DNF: 09/28/17, 10/27/17 For chronic pain 60 tablet 0  . hydroxychloroquine (PLAQUENIL) 200 MG tablet Take 200 mg by mouth 2 (two) times daily.  3  . lisinopril (PRINIVIL,ZESTRIL) 20 MG tablet Take 20 mg by mouth 2 (two) times daily.     [name] losartan (COZAAR) 25 MG tablet Take 25 mg by mouth daily.  11  . Menthol-Methyl Salicylate (MUSCLE RUB EX) Apply 1 application topically as needed (for shoulder or knee pain).     . OXYGEN Inhale 2 L into the lungs at bedtime.    . predniSONE (DELTASONE) 5 MG tablet Take 2 tablets (10mg ) twice daily.    spironolactone (ALDACTONE) 25 MG tablet Take 1 tablet (25 mg total) by mouth daily. 30 tablet 11   No current facility-administered medications for this visit.    No Known Allergies   Review of Systems: All systems reviewed and negative except where noted in HPI.   Lab Results  Component Value Date   WBC 12.6 (H) 09/14/2017   HGB 14.4 09/14/2017   HCT 42.4 09/14/2017   MCV 90.4 09/14/2017   PLT 178 09/14/2017    Lab Results  Component Value Date   CREATININE 1.03 08/26/2017   BUN 9 08/26/2017    NA 138 08/26/2017   K 4.4 08/26/2017   CL 100 08/26/2017   CO2 21 08/26/2017     Physical Exam: BP 108/80 (BP Location: Left Arm, Patient Position: Sitting, Cuff Size: Normal)   Pulse 100   Ht 5' 10.5" (1.791 m) Comment: height measured without shoes  Wt 207 lb 4 oz (94 kg)   BMI 29.32 kg/m  Constitutional: Pleasant,male in no acute distress. HEENT: Normocephalic and atraumatic. Conjunctivae are normal. No scleral icterus. Neck supple.  Cardiovascular: Normal rate, regular rhythm.  Pulmonary/chest: Effort normal and breath sounds normal.  Abdominal: Soft, nondistended, nontender.  There are no masses palpable. No hepatomegaly. Extremities: no edema Lymphadenopathy: No cervical adenopathy noted. Neurological: Alert and oriented to person place and time. Skin: Skin is warm and dry. No rashes noted. Psychiatric: Normal mood and affect. Behavior is normal.   ASSESSMENT AND PLAN: 61 year old male with a significant cardiovascular history including stroke and MI within the past year, s/p recent CEA and monitoring of AAA, recently admitted for pancreatitis and referred here to discuss colon cancer screening.   Pancreatitis - I'm concerned about his acute rise and fall of liver associated enzymes when admitted with pancreatitis, it's possible he passed a gallstone although his biliary tree was normal on imaging. Pancreatitis related to sulfasalazine is also possible although I think less likely. He never had an MRCP performed at the time which could have helped sort this out. I discussed that if he did have biliary pancreatitis he is at risk for recurrence of pancreatitis without having a cholecystectomy. Given his other medical problems he was not interested in surgical evaluation at this time and declined a surgical consult. I relayed that he would need to see his cardiologist for reassessment prior to considering any operative intervention. I will repeat his liver function testing today  to insure this is normalized. Otherwise he does understand his risk for recurrent pancreatitis without definitive therapy of his gallbladder, he understands this and will seek evaluation if he has any recurrent symptoms.  Gallbladder polyps - normally recommend repeat US in 6 months to assess for interval changes. As above, if he is not interested in cholecystectomy, may forgo further surveillance exams.  Colon cancer screening - he is overdue for colon cancer screening and I suspect he likely has some colon polyps given his tobacco history, fortunately he has no anemia or alarm symptoms. He has significant comorbidities in regards to his anesthesia risks and we discussed whether or not he wanted any type of colon cancer screening (given his life expectancy may not be 10 years), and if colon cancer was diagnosed, would he want therapy. We discussed what optical colonoscopy would entail versus stool based testing, versus no screening. At this point time he and his sister want to have screening but agree that he is high risk for anesthesia to proceed directly with colonoscopy. Will send FIT stool test. If positive, he will consider optical colonoscopy. I recommend he follow up with cardiology if he is considering any procedures that require anesthesia.   Ileene Patrick, MD [facility] Pager [phone]  CC: [name], PA-C

## 2017-11-01 ENCOUNTER — Inpatient Hospital Stay (HOSPITAL_COMMUNITY)
Admission: EM | Admit: 2017-11-01 | Discharge: 2017-11-03 | DRG: 196 | Disposition: A | Discharge status: Home / Self Care | Payer: Medicaid Other | Attending: Internal Medicine | Admitting: Internal Medicine

## 2017-11-01 ENCOUNTER — Encounter (HOSPITAL_COMMUNITY): Payer: Self-pay | Admitting: Emergency Medicine

## 2017-11-01 ENCOUNTER — Other Ambulatory Visit: Payer: Self-pay

## 2017-11-01 ENCOUNTER — Telehealth: Payer: Self-pay | Admitting: Internal Medicine

## 2017-11-01 ENCOUNTER — Emergency Department (HOSPITAL_COMMUNITY): Payer: Medicaid Other

## 2017-11-01 DIAGNOSIS — M069 Rheumatoid arthritis, unspecified: Secondary | ICD-10-CM | POA: Diagnosis present

## 2017-11-01 DIAGNOSIS — I252 Old myocardial infarction: Secondary | ICD-10-CM | POA: Diagnosis not present

## 2017-11-01 DIAGNOSIS — I2510 Atherosclerotic heart disease of native coronary artery without angina pectoris: Secondary | ICD-10-CM | POA: Diagnosis present

## 2017-11-01 DIAGNOSIS — I5043 Acute on chronic combined systolic (congestive) and diastolic (congestive) heart failure: Secondary | ICD-10-CM | POA: Diagnosis present

## 2017-11-01 DIAGNOSIS — Z9981 Dependence on supplemental oxygen: Secondary | ICD-10-CM

## 2017-11-01 DIAGNOSIS — Z7952 Long term (current) use of systemic steroids: Secondary | ICD-10-CM

## 2017-11-01 DIAGNOSIS — R05 Cough: Secondary | ICD-10-CM | POA: Diagnosis present

## 2017-11-01 DIAGNOSIS — Z8673 Personal history of transient ischemic attack (TIA), and cerebral infarction without residual deficits: Secondary | ICD-10-CM | POA: Diagnosis not present

## 2017-11-01 DIAGNOSIS — I714 Abdominal aortic aneurysm, without rupture: Secondary | ICD-10-CM | POA: Diagnosis present

## 2017-11-01 DIAGNOSIS — J189 Pneumonia, unspecified organism: Secondary | ICD-10-CM | POA: Diagnosis not present

## 2017-11-01 DIAGNOSIS — I5042 Chronic combined systolic (congestive) and diastolic (congestive) heart failure: Secondary | ICD-10-CM | POA: Diagnosis present

## 2017-11-01 DIAGNOSIS — Z7982 Long term (current) use of aspirin: Secondary | ICD-10-CM | POA: Diagnosis not present

## 2017-11-01 DIAGNOSIS — J9621 Acute and chronic respiratory failure with hypoxia: Secondary | ICD-10-CM | POA: Diagnosis present

## 2017-11-01 DIAGNOSIS — R059 Cough, unspecified: Secondary | ICD-10-CM

## 2017-11-01 DIAGNOSIS — J9601 Acute respiratory failure with hypoxia: Secondary | ICD-10-CM | POA: Diagnosis not present

## 2017-11-01 DIAGNOSIS — F17210 Nicotine dependence, cigarettes, uncomplicated: Secondary | ICD-10-CM | POA: Diagnosis present

## 2017-11-01 DIAGNOSIS — J029 Acute pharyngitis, unspecified: Secondary | ICD-10-CM | POA: Diagnosis present

## 2017-11-01 DIAGNOSIS — K219 Gastro-esophageal reflux disease without esophagitis: Secondary | ICD-10-CM | POA: Diagnosis present

## 2017-11-01 DIAGNOSIS — Z79899 Other long term (current) drug therapy: Secondary | ICD-10-CM | POA: Diagnosis not present

## 2017-11-01 DIAGNOSIS — J849 Interstitial pulmonary disease, unspecified: Principal | ICD-10-CM | POA: Diagnosis present

## 2017-11-01 DIAGNOSIS — Z951 Presence of aortocoronary bypass graft: Secondary | ICD-10-CM | POA: Diagnosis not present

## 2017-11-01 HISTORY — DX: Pneumonia, unspecified organism: J18.9

## 2017-11-01 LAB — COMPREHENSIVE METABOLIC PANEL
ALT: 24 U/L (ref 17–63)
AST: 20 U/L (ref 15–41)
Albumin: 3.3 g/dL — ABNORMAL LOW (ref 3.5–5.0)
Alkaline Phosphatase: 92 U/L (ref 38–126)
Anion gap: 9 (ref 5–15)
BUN: 15 mg/dL (ref 6–20)
CALCIUM: 8.9 mg/dL (ref 8.9–10.3)
CO2: 20 mmol/L — ABNORMAL LOW (ref 22–32)
CREATININE: 1.12 mg/dL (ref 0.61–1.24)
Chloride: 104 mmol/L (ref 101–111)
GLUCOSE: 120 mg/dL — AB (ref 65–99)
Potassium: 3.9 mmol/L (ref 3.5–5.1)
SODIUM: 133 mmol/L — AB (ref 135–145)
TOTAL PROTEIN: 7.5 g/dL (ref 6.5–8.1)
Total Bilirubin: 1.3 mg/dL — ABNORMAL HIGH (ref 0.3–1.2)

## 2017-11-01 LAB — CBC WITH DIFFERENTIAL/PLATELET
BASOS ABS: 0 10*3/uL (ref 0.0–0.1)
BASOS PCT: 0 %
EOS ABS: 0.1 10*3/uL (ref 0.0–0.7)
EOS PCT: 1 %
HCT: 37.3 % — ABNORMAL LOW (ref 39.0–52.0)
Hemoglobin: 12.6 g/dL — ABNORMAL LOW (ref 13.0–17.0)
Lymphocytes Relative: 9 %
Lymphs Abs: 1 10*3/uL (ref 0.7–4.0)
MCH: 30.3 pg (ref 26.0–34.0)
MCHC: 33.8 g/dL (ref 30.0–36.0)
MCV: 89.7 fL (ref 78.0–100.0)
MONO ABS: 0.6 10*3/uL (ref 0.1–1.0)
Monocytes Relative: 6 %
Neutro Abs: 9.1 10*3/uL — ABNORMAL HIGH (ref 1.7–7.7)
Neutrophils Relative %: 84 %
PLATELETS: 171 10*3/uL (ref 150–400)
RBC: 4.16 MIL/uL — ABNORMAL LOW (ref 4.22–5.81)
RDW: 14.8 % (ref 11.5–15.5)
WBC: 10.8 10*3/uL — ABNORMAL HIGH (ref 4.0–10.5)

## 2017-11-01 LAB — I-STAT TROPONIN, ED: Troponin i, poc: 0.03 ng/mL (ref 0.00–0.08)

## 2017-11-01 LAB — LIPASE, BLOOD: Lipase: 26 U/L (ref 11–51)

## 2017-11-01 LAB — I-STAT CG4 LACTIC ACID, ED: LACTIC ACID, VENOUS: 1.71 mmol/L (ref 0.5–1.9)

## 2017-11-01 LAB — BRAIN NATRIURETIC PEPTIDE: B Natriuretic Peptide: 149.9 pg/mL — ABNORMAL HIGH (ref 0.0–100.0)

## 2017-11-01 LAB — PROCALCITONIN: Procalcitonin: 0.11 ng/mL

## 2017-11-01 MED ORDER — LEVOFLOXACIN IN D5W 750 MG/150ML IV SOLN: 750.0000 mg | INTRAVENOUS | Status: DC

## 2017-11-01 MED ORDER — ASPIRIN 81 MG PO CHEW
81.0000 mg | CHEWABLE_TABLET | Freq: Every day | ORAL | Status: DC
  Administered 2017-11-02 – 2017-11-03 (×2): 81 mg via ORAL
  Filled 2017-11-01 (×2): qty 1

## 2017-11-01 MED ORDER — SPIRONOLACTONE 25 MG PO TABS: 25.0000 mg | ORAL_TABLET | Freq: Every day | ORAL | Status: DC

## 2017-11-01 MED ORDER — ATORVASTATIN CALCIUM 40 MG PO TABS
40.0000 mg | ORAL_TABLET | Freq: Every day | ORAL | Status: DC
  Administered 2017-11-01 – 2017-11-02 (×2): 40 mg via ORAL
  Filled 2017-11-01 (×2): qty 1

## 2017-11-01 MED ORDER — LOSARTAN POTASSIUM 25 MG PO TABS: 25.0000 mg | ORAL_TABLET | Freq: Every day | ORAL | Status: DC

## 2017-11-01 MED ORDER — HYDROCODONE-ACETAMINOPHEN 7.5-325 MG PO TABS
1.0000 | ORAL_TABLET | Freq: Two times a day (BID) | ORAL | Status: DC | PRN
  Administered 2017-11-01 – 2017-11-02 (×2): 1 via ORAL
  Filled 2017-11-01 (×2): qty 1

## 2017-11-01 MED ORDER — ENOXAPARIN SODIUM 40 MG/0.4ML ~~LOC~~ SOLN
40.0000 mg | SUBCUTANEOUS | Status: DC
  Administered 2017-11-01 – 2017-11-02 (×2): 40 mg via SUBCUTANEOUS
  Filled 2017-11-01 (×2): qty 0.4

## 2017-11-01 MED ORDER — PHENOL 1.4 % MT LIQD
1.0000 | OROMUCOSAL | Status: DC | PRN
  Filled 2017-11-01: qty 177

## 2017-11-01 MED ORDER — GABAPENTIN 300 MG PO CAPS
300.0000 mg | ORAL_CAPSULE | Freq: Three times a day (TID) | ORAL | Status: DC
  Administered 2017-11-01 – 2017-11-03 (×5): 300 mg via ORAL
  Filled 2017-11-01 (×5): qty 1

## 2017-11-01 MED ORDER — ALBUTEROL SULFATE (2.5 MG/3ML) 0.083% IN NEBU
3.0000 mL | INHALATION_SOLUTION | Freq: Four times a day (QID) | RESPIRATORY_TRACT | Status: DC | PRN
  Administered 2017-11-01 – 2017-11-02 (×2): 3 mL via RESPIRATORY_TRACT
  Filled 2017-11-01 (×2): qty 3

## 2017-11-01 MED ORDER — LEVOFLOXACIN IN D5W 750 MG/150ML IV SOLN
750.0000 mg | Freq: Once | INTRAVENOUS | Status: AC
  Administered 2017-11-01: 750 mg via INTRAVENOUS
  Filled 2017-11-01: qty 150

## 2017-11-01 MED ORDER — IPRATROPIUM-ALBUTEROL 0.5-2.5 (3) MG/3ML IN SOLN
3.0000 mL | Freq: Once | RESPIRATORY_TRACT | Status: AC
  Administered 2017-11-01: 3 mL via RESPIRATORY_TRACT
  Filled 2017-11-01: qty 3

## 2017-11-01 MED ORDER — FUROSEMIDE 10 MG/ML IJ SOLN
20.0000 mg | Freq: Once | INTRAMUSCULAR | Status: AC
  Administered 2017-11-01: 20 mg via INTRAVENOUS
  Filled 2017-11-01: qty 2

## 2017-11-01 MED ORDER — DEXAMETHASONE SODIUM PHOSPHATE 10 MG/ML IJ SOLN
10.0000 mg | Freq: Once | INTRAMUSCULAR | Status: AC
  Administered 2017-11-01: 10 mg via INTRAVENOUS
  Filled 2017-11-01: qty 1

## 2017-11-01 MED ORDER — ACETAMINOPHEN 500 MG PO TABS: 1000.0000 mg | ORAL_TABLET | Freq: Four times a day (QID) | ORAL | Status: DC | PRN

## 2017-11-01 MED ORDER — HYDROXYCHLOROQUINE SULFATE 200 MG PO TABS
200.0000 mg | ORAL_TABLET | Freq: Two times a day (BID) | ORAL | Status: DC
  Administered 2017-11-01 – 2017-11-03 (×4): 200 mg via ORAL
  Filled 2017-11-01 (×4): qty 1

## 2017-11-01 MED ORDER — CARVEDILOL 6.25 MG PO TABS
6.2500 mg | ORAL_TABLET | Freq: Two times a day (BID) | ORAL | Status: DC
  Administered 2017-11-01 – 2017-11-03 (×4): 6.25 mg via ORAL
  Filled 2017-11-01 (×4): qty 1

## 2017-11-01 MED ORDER — PREDNISONE 10 MG PO TABS: ORAL_TABLET | ORAL | 0 refills | Status: DC

## 2017-11-01 MED ORDER — METHYLPREDNISOLONE SODIUM SUCC 40 MG IJ SOLR
40.0000 mg | Freq: Four times a day (QID) | INTRAMUSCULAR | Status: DC
  Administered 2017-11-01 – 2017-11-03 (×6): 40 mg via INTRAVENOUS
  Filled 2017-11-01 (×9): qty 1

## 2017-11-01 MED ORDER — CEPHALEXIN 500 MG PO CAPS: 500.0000 mg | ORAL_CAPSULE | Freq: Three times a day (TID) | ORAL | 0 refills | Status: DC

## 2017-11-01 NOTE — H&P (Signed)
History and Physical    [name] WUJ:[id number] DOB: 10-24-1956 DOA: 11/01/2017  Referring MD/NP/PA: er PCP: [name], PA-C Outpatient Specialists: [facility] Patient coming from: home  Chief Complaint: fever/SOB  HPI: [name] is a 61 y.o. male with medical history significant of ILD, CAD, and current tobacco abuse. Comes in with a few days of worsening SOB and back pain with breathing.  He has had a low grade fever of 100.5.  He has had productive cough.  Wears 2L of O2 at night only.  His pulmonologist called in abx and steroids but he has yet to start them.   No dysuria, no dysphagia, no diarrhea, no sick contacts  In the ER, he was found to have a mild leukocytosis, an x ray concerning for a multifocal PNA vs edema.  Patient was ambulated and his O2 sat was down to 81%.  ER PA gave decadron and IV levaquin and asked for hospitalist admission.      Review of Systems: all systems reviewed, negative unless stated above in HPI   Past Medical History:  Diagnosis Date  . AAA (abdominal aortic aneurysm) (HCC)   . Arthritis   . CAD in native artery    a. h/o MI x 3 s/p CABG x 4 (1993 with Tyrone Sage). b. LHC 01/2017 with severe native disease, 2 grafts occluded.  . Carotid artery disease (HCC)    a. s/p R CEA 11/2016.  [name] Chronic combined systolic and diastolic CHF (congestive heart failure) (HCC)   . GERD (gastroesophageal reflux disease)   . ILD (interstitial lung disease) (HCC)   . Lung nodule   . MI (myocardial infarction) (HCC)    x 3   . Normocytic anemia   . NSVT (nonsustained ventricular tachycardia) (HCC)   . RA (rheumatoid arthritis) (HCC)   . Stroke (HCC)   . Tobacco abuse     Past Surgical History:  Procedure Laterality Date  . CARDIAC CATHETERIZATION    . CAROTID ENDARTERECTOMY Left   . Heart Bypass  10/1992  . LEFT HEART CATH AND CORS/GRAFTS ANGIOGRAPHY N/A 03/01/2017   Procedure: Left Heart Cath and Cors/Grafts Angiography;  Surgeon:  Lyn Records, MD;  Location: [facility] INVASIVE CV LAB;  Service: Cardiovascular;  Laterality: N/A;  . Throat Biopsy     Cat scratch fever     reports that he has been smoking cigarettes.  He has a 23.50 pack-year smoking history. he has never used smokeless tobacco. He reports that he does not drink alcohol or use drugs.  No Known Allergies  Family History  Problem Relation Age of Onset  . Heart murmur Mother   . Heart disease Mother   . Heart attack Mother   . Suicidality Father   . Heart attack Father   . Stroke Brother   . Heart attack Brother   . Heart attack Maternal Grandmother   . Cancer Maternal Grandmother        unknown type  . Lung cancer Maternal Aunt   . Lung cancer Maternal Uncle   . Cancer Maternal Aunt        unknown cancer  . Heart attack Brother      Prior to Admission medications   Medication Sig Start Date End Date Taking? Authorizing Provider  acetaminophen (TYLENOL) 500 MG tablet Take 1,000 mg by mouth every 6 (six) hours as needed for mild pain.    Yes [provider]  albuterol (PROVENTIL HFA;VENTOLIN HFA) 108 (90 Base) MCG/ACT inhaler  Inhale 2 puffs into the lungs every 6 (six) hours as needed for wheezing or shortness of breath. 04/02/17  Yes Kalman Shan, MD  aspirin 81 MG chewable tablet Chew 1 tablet (81 mg total) by mouth daily. 03/03/17  Yes Leroy Sea, MD  atorvastatin (LIPITOR) 40 MG tablet Take 1 tablet (40 mg total) by mouth daily at 6 PM. 03/15/17  Yes Dunn, Dayna N, PA-C  carvedilol (COREG) 6.25 MG tablet Take 1 tablet (6.25 mg total) by mouth 2 (two) times daily with a meal. 08/11/17 11/09/17 Yes Gonfa, Boyce Medici, MD  gabapentin (NEURONTIN) 300 MG capsule Take 1 capsule (300 mg total) by mouth every 8 (eight) hours. 06/24/17  Yes Ryan Jolly, MD  HYDROcodone-acetaminophen (NORCO) 7.5-325 MG tablet Take 1 tablet by mouth 2 (two) times daily as needed for moderate pain. DNF: 09/28/17, 10/27/17 For chronic pain Patient taking  differently: Take 1 tablet by mouth 2 (two) times daily. DNF: 09/28/17, 10/27/17 For chronic pain 09/02/17  Yes Ryan Jolly, MD  hydroxychloroquine (PLAQUENIL) 200 MG tablet Take 200 mg by mouth 2 (two) times daily. 10/24/17  Yes [provider]  losartan (COZAAR) 25 MG tablet Take 25 mg by mouth daily. 10/24/17  Yes [provider]  Menthol-Methyl Salicylate (MUSCLE RUB EX) Apply 1 application topically as needed (for shoulder or knee pain).    Yes [provider]  OXYGEN Inhale 2 L into the lungs at bedtime.   Yes [provider]  predniSONE (DELTASONE) 10 MG tablet 4 tabs x 1 day, 3 tabs x 1 day, 2 tabs x 1 day, 1 tab x 1 day then 0.5 x 1 day then stop 11/01/17  Yes Kalman Shan, MD  spironolactone (ALDACTONE) 25 MG tablet Take 1 tablet (25 mg total) by mouth daily. 03/15/17  Yes Dunn, Dayna N, PA-C  albuterol (ACCUNEB) 0.63 MG/3ML nebulizer solution Take 3 mLs by nebulization every 6 (six) hours as needed for wheezing. 10/15/17   [provider]  cephALEXin (KEFLEX) 500 MG capsule Take 1 capsule (500 mg total) by mouth 3 (three) times daily. 11/01/17   Kalman Shan, MD  predniSONE (DELTASONE) 5 MG tablet Take 2 tablets (10mg ) twice daily. 05/04/17   [provider]    Physical Exam: Vitals:   11/01/17 1445 11/01/17 1500 11/01/17 1530 11/01/17 1540  BP:  98/61  117/65  Pulse: 92 90 86 92  Resp: (!) 25 (!) 23 (!) 27 (!) 27  Temp:      TempSrc:      SpO2: 98% 96% 93% 95%      Constitutional: chronically ill appearing- wearing O2 Vitals:   11/01/17 1445 11/01/17 1500 11/01/17 1530 11/01/17 1540  BP:  98/61  117/65  Pulse: 92 90 86 92  Resp: (!) 25 (!) 23 (!) 27 (!) 27  Temp:      TempSrc:      SpO2: 98% 96% 93% 95%   Eyes: PERRL, lids and conjunctivae normal ENMT: Mucous membranes are moist. Small petechiae on posterior soft palate.  Neck: normal, supple, no masses, no thyromegaly Respiratory: crackles throughout, no  wheezing heard Cardiovascular: Regular rate and rhythm, no murmurs / rubs / gallops. No extremity edema. 2+ pedal pulses. No carotid bruits.  Abdomen: no tenderness, no masses palpated. No hepatosplenomegaly. Bowel sounds positive.  Musculoskeletal: no clubbing / cyanosis. No joint deformity upper and lower extremities. Good ROM, no contractures. Normal muscle tone.  Skin: no rashes, lesions, ulcers. No induration Neurologic: CN 2-12 grossly intact. Sensation  intact, DTR normal. Strength 5/5 in all 4.  Psychiatric: Normal judgment and insight. Alert and oriented x 3. Normal mood.     Labs on Admission: I have personally reviewed following labs and imaging studies  CBC: Recent Labs  Lab 11/01/17 1154  WBC 10.8*  NEUTROABS 9.1*  HGB 12.6*  HCT 37.3*  MCV 89.7  PLT 171   Basic Metabolic Panel: Recent Labs  Lab 11/01/17 1154  NA 133*  K 3.9  CL 104  CO2 20*  GLUCOSE 120*  BUN 15  CREATININE 1.12  CALCIUM 8.9   GFR: Estimated Creatinine Clearance: 79.4 mL/min (by C-G formula based on SCr of 1.12 mg/dL). Liver Function Tests: Recent Labs  Lab 10/29/17 1428 11/01/17 1154  AST 4 20  ALT 14 24  ALKPHOS 88 92  BILITOT 0.9 1.3*  PROT 8.1 7.5  ALBUMIN 4.4 3.3*   Recent Labs  Lab 11/01/17 1410  LIPASE 26   No results for input(s): AMMONIA in the last 168 hours. Coagulation Profile: No results for input(s): INR, PROTIME in the last 168 hours. Cardiac Enzymes: No results for input(s): CKTOTAL, CKMB, CKMBINDEX, TROPONINI in the last 168 hours. BNP (last 3 results) No results for input(s): PROBNP in the last 8760 hours. HbA1C: No results for input(s): HGBA1C in the last 72 hours. CBG: No results for input(s): GLUCAP in the last 168 hours. Lipid Profile: No results for input(s): CHOL, HDL, LDLCALC, TRIG, CHOLHDL, LDLDIRECT in the last 72 hours. Thyroid Function Tests: No results for input(s): TSH, T4TOTAL, FREET4, T3FREE, THYROIDAB in the last 72 hours. Anemia  Panel: No results for input(s): VITAMINB12, FOLATE, FERRITIN, TIBC, IRON, RETICCTPCT in the last 72 hours. Urine analysis:    Component Value Date/Time   COLORURINE AMBER (A) 08/08/2017 1742   APPEARANCEUR CLEAR 08/08/2017 1742   LABSPEC 1.018 08/08/2017 1742   PHURINE 5.0 08/08/2017 1742   GLUCOSEU NEGATIVE 08/08/2017 1742   HGBUR NEGATIVE 08/08/2017 1742   BILIRUBINUR NEGATIVE 08/08/2017 1742   KETONESUR NEGATIVE 08/08/2017 1742   PROTEINUR 100 (A) 08/08/2017 1742   NITRITE NEGATIVE 08/08/2017 1742   LEUKOCYTESUR NEGATIVE 08/08/2017 1742   Sepsis Labs: Invalid input(s): PROCALCITONIN, LACTICIDVEN No results found for this or any previous visit (from the past 240 hour(s)).   Radiological Exams on Admission: Dg Chest 2 View  Result Date: 11/01/2017 CLINICAL DATA:  Patient with cough and shortness of breath. EXAM: CHEST  2 VIEW COMPARISON:  Chest radiograph 09/08/2017 FINDINGS: Monitoring leads overlie the patient. Status post median sternotomy. Stable cardiomegaly. Interval development of diffuse bilateral coarse interstitial pulmonary opacities and multifocal areas of consolidation. No definite pleural effusion or pneumothorax. Thoracic spine degenerative changes. IMPRESSION: Interval development of diffuse bilateral coarse interstitial opacities and multiple areas of pulmonary consolidation concerning for pneumonia and/or edema superimposed upon chronic pulmonary process. Electronically Signed   By: Annia Belt M.D.   On: 11/01/2017 12:57    EKG: Independently reviewed. Sinus at rate of 100  Assessment/Plan Active Problems:   ILD (interstitial lung disease) (HCC)   Acute respiratory failure with hypoxia (HCC)   Chronic combined systolic and diastolic CHF (congestive heart failure) (HCC)   Pneumonia  PNA -started on levaquin in the ER, will continue -resp viral panel pending -nebs -IV Steroids -wears O2 at night only -blood cultures done AFTER abx  Sore throat -small  petechiae in back of throat will do strep screen  ILD -follows with Dr. 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 Tresa Pua -chronically on prednisone and sulfasalazine    DVT prophylaxis: lovenox Code Status: full Family Communication: patient Disposition Plan:  Consults called:  Admission status: inpt  Joseph Art DO [facility] Pager [phone]  If 7PM-7AM, please contact night-coverage [email or web address] Password [facility]  11/01/2017, 4:42 PM

## 2017-11-01 NOTE — ED Notes (Addendum)
Walked by prior RN, patient became dyspneic and stats dropped to 84% while walking

## 2017-11-01 NOTE — ED Triage Notes (Signed)
Pt arrives from home via [facility] c/o SOB 2-3 days with pain on deep resp, reports productive cough same duration, reports fever at home, 100F, reports taking ibuprofen and tylenol. Pt c/o sore throat, denies n/v.

## 2017-11-01 NOTE — ED Provider Notes (Signed)
[facility] EMERGENCY DEPARTMENT Provider Note   CSN: [id number] Arrival date & time: 11/01/17  1142     History   Chief Complaint Chief Complaint  Patient presents with  . Shortness of Breath    HPI [name] is a 61 y.o. male.  The history is provided by the patient, medical records and the EMS personnel. No language interpreter was used.  Shortness of Breath  Associated symptoms include a fever, cough and chest pain. Pertinent negatives include no vomiting, no abdominal pain and no leg swelling.   [name] is a 61 y.o. male  with a PMH of interstitial lung disease, CAD s/p CABG, AAA, RA, CHF who presents to the Emergency Department complaining of progressively worsening shortness of breath times 2-3 days.  Associated with worsening productive cough and chest pain that is worse with deep inspirations.  Patient also reports a fever of 100.5 at home.  He took 2 Tylenol prior to arrival and feels as if fever has resolved.  Temp of 98.6 in triage. Denies known sick contacts.  Patient reports having pneumonia 2-3 weeks ago and states he was diagnosed with pneumonia, however I cannot find this in chart review.  He states he was started on antibiotics and felt better, but now feels worse as of 3 days ago.  Past Medical History:  Diagnosis Date  . AAA (abdominal aortic aneurysm) (HCC)   . Arthritis   . CAD in native artery    a. h/o MI x 3 s/p CABG x 4 (1993 with Tyrone Sage). b. LHC 01/2017 with severe native disease, 2 grafts occluded.  . Carotid artery disease (HCC)    a. s/p R CEA 11/2016.  [name] Chronic combined systolic and diastolic CHF (congestive heart failure) (HCC)   . GERD (gastroesophageal reflux disease)   . ILD (interstitial lung disease) (HCC)   . Lung nodule   . MI (myocardial infarction) (HCC)    x 3   . Normocytic anemia   . NSVT (nonsustained ventricular tachycardia) (HCC)   . RA (rheumatoid arthritis) (HCC)   . Stroke (HCC)   . Tobacco  abuse     Patient Active Problem List   Diagnosis Date Noted  . Elevated lipase   . Elevated liver enzymes   . Acute pancreatitis 08/08/2017  . Carotid artery disease (HCC) 05/12/2017  . 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 Chronic combined systolic and diastolic CHF (congestive heart failure) (HCC) 03/07/2017  . S/P CABG (coronary artery bypass graft)   . Pure hypercholesterolemia   . Acute combined systolic and diastolic heart failure (HCC)   . Exertional shortness of breath   . Solitary pulmonary nodule   . CAD (coronary artery disease) 02/24/2017  . ILD (interstitial lung disease) (HCC) 02/24/2017  . Normocytic anemia 02/24/2017  . AAA (abdominal aortic aneurysm) (HCC) 02/24/2017  . Abnormal EKG 02/24/2017  . Acute respiratory failure with hypoxia (HCC) 02/24/2017  . History of stroke 02/24/2017  . Essential hypertension 02/24/2017  . Current smoker 02/24/2017    Past Surgical History:  Procedure Laterality Date  . CARDIAC CATHETERIZATION    . CAROTID ENDARTERECTOMY Left   . Heart Bypass  10/1992  . LEFT HEART CATH AND CORS/GRAFTS ANGIOGRAPHY N/A 03/01/2017   Procedure: Left Heart Cath and Cors/Grafts Angiography;  Surgeon: Lyn Records, MD;  Location: [facility] INVASIVE CV LAB;  Service: Cardiovascular;  Laterality: N/A;  . Throat Biopsy     Cat scratch fever  Home Medications    Prior to Admission medications   Medication Sig Start Date End Date Taking? Authorizing Provider  acetaminophen (TYLENOL) 500 MG tablet Take 1,000 mg by mouth every 6 (six) hours as needed for mild pain.    Yes [provider]  albuterol (PROVENTIL HFA;VENTOLIN HFA) 108 (90 Base) MCG/ACT inhaler Inhale 2 puffs into the lungs every 6 (six) hours as needed for wheezing or shortness of breath. 04/02/17  Yes Kalman Shan, MD  aspirin 81 MG chewable tablet Chew 1 tablet (81 mg total) by mouth daily. 03/03/17  Yes Leroy Sea, MD  atorvastatin  (LIPITOR) 40 MG tablet Take 1 tablet (40 mg total) by mouth daily at 6 PM. 03/15/17  Yes Dunn, Dayna N, PA-C  carvedilol (COREG) 6.25 MG tablet Take 1 tablet (6.25 mg total) by mouth 2 (two) times daily with a meal. 08/11/17 11/09/17 Yes Gonfa, Boyce Medici, MD  gabapentin (NEURONTIN) 300 MG capsule Take 1 capsule (300 mg total) by mouth every 8 (eight) hours. 06/24/17  Yes Edward Jolly, MD  HYDROcodone-acetaminophen (NORCO) 7.5-325 MG tablet Take 1 tablet by mouth 2 (two) times daily as needed for moderate pain. DNF: 09/28/17, 10/27/17 For chronic pain Patient taking differently: Take 1 tablet by mouth 2 (two) times daily. DNF: 09/28/17, 10/27/17 For chronic pain 09/02/17  Yes Edward Jolly, MD  hydroxychloroquine (PLAQUENIL) 200 MG tablet Take 200 mg by mouth 2 (two) times daily. 10/24/17  Yes [provider]  losartan (COZAAR) 25 MG tablet Take 25 mg by mouth daily. 10/24/17  Yes [provider]  Menthol-Methyl Salicylate (MUSCLE RUB EX) Apply 1 application topically as needed (for shoulder or knee pain).    Yes [provider]  OXYGEN Inhale 2 L into the lungs at bedtime.   Yes [provider]  predniSONE (DELTASONE) 10 MG tablet 4 tabs x 1 day, 3 tabs x 1 day, 2 tabs x 1 day, 1 tab x 1 day then 0.5 x 1 day then stop 11/01/17  Yes Kalman Shan, MD  spironolactone (ALDACTONE) 25 MG tablet Take 1 tablet (25 mg total) by mouth daily. 03/15/17  Yes Dunn, Dayna N, PA-C  albuterol (ACCUNEB) 0.63 MG/3ML nebulizer solution Take 3 mLs by nebulization every 6 (six) hours as needed for wheezing. 10/15/17   [provider]  cephALEXin (KEFLEX) 500 MG capsule Take 1 capsule (500 mg total) by mouth 3 (three) times daily. 11/01/17   Kalman Shan, MD  predniSONE (DELTASONE) 5 MG tablet Take 2 tablets (10mg ) twice daily. 05/04/17   [provider]    Family History Family History  Problem Relation Age of Onset  . Heart murmur Mother   . Heart disease Mother    . Heart attack Mother   . Suicidality Father   . Heart attack Father   . Stroke Brother   . Heart attack Brother   . Heart attack Maternal Grandmother   . Cancer Maternal Grandmother        unknown type  . Lung cancer Maternal Aunt   . Lung cancer Maternal Uncle   . Cancer Maternal Aunt        unknown cancer  . Heart attack Brother     Social History Social History   Tobacco Use  . Smoking status: Current Every Day Smoker    Packs/day: 0.50    Years: 47.00    Pack years: 23.50    Types: Cigarettes  . Smokeless tobacco: Never Used  Substance Use Topics  .  Alcohol use: No    Comment: remote history of beer drinking  . Drug use: No     Allergies   Patient has no known allergies.   Review of Systems Review of Systems  Constitutional: Positive for chills and fever.  Respiratory: Positive for cough and shortness of breath.   Cardiovascular: Positive for chest pain. Negative for palpitations and leg swelling.  Gastrointestinal: Negative for abdominal pain, diarrhea, nausea and vomiting.     Physical Exam Updated Vital Signs BP 117/65   Pulse 92   Temp 98.6 F (37 C) (Oral)   Resp (!) 27   SpO2 95%   Physical Exam  Constitutional: He is oriented to person, place, and time. He appears well-developed and well-nourished. No distress.  HENT:  Head: Normocephalic and atraumatic.  Cardiovascular: Normal rate, regular rhythm and normal heart sounds.  No murmur heard. Pulmonary/Chest: Effort normal. No respiratory distress.  Wheezing and crackles throughout mid to lower lung fields.  Abdominal: Soft. He exhibits no distension. There is no tenderness.  Musculoskeletal: He exhibits no edema.  Neurological: He is alert and oriented to person, place, and time.  Skin: Skin is warm and dry.  Nursing note and vitals reviewed.    ED Treatments / Results  Labs (all labs ordered are listed, but only abnormal results are displayed) Labs Reviewed  COMPREHENSIVE  METABOLIC PANEL - Abnormal; Notable for the following components:      Result Value   Sodium 133 (*)    CO2 20 (*)    Glucose, Bld 120 (*)    Albumin 3.3 (*)    Total Bilirubin 1.3 (*)    All other components within normal limits  CBC WITH DIFFERENTIAL/PLATELET - Abnormal; Notable for the following components:   WBC 10.8 (*)    RBC 4.16 (*)    Hemoglobin 12.6 (*)    HCT 37.3 (*)    Neutro Abs 9.1 (*)    All other components within normal limits  BRAIN NATRIURETIC PEPTIDE - Abnormal; Notable for the following components:   B Natriuretic Peptide 149.9 (*)    All other components within normal limits  RESPIRATORY PANEL BY PCR  LIPASE, BLOOD  I-STAT TROPONIN, ED  I-STAT CG4 LACTIC ACID, ED  I-STAT CG4 LACTIC ACID, ED    EKG  EKG Interpretation  Date/Time:  Monday November 01 2017 11:50:17 EST Ventricular Rate:  100 PR Interval:    QRS Duration: 118 QT Interval:  377 QTC Calculation: 487 R Axis:   27 Text Interpretation:  Sinus tachycardia Nonspecific intraventricular conduction delay likely repol abnormalities from IVCD Confirmed by Marily Memos [phone]) on 11/01/2017 4:20:41 PM       Radiology Dg Chest 2 View  Result Date: 11/01/2017 CLINICAL DATA:  Patient with cough and shortness of breath. EXAM: CHEST  2 VIEW COMPARISON:  Chest radiograph 09/08/2017 FINDINGS: Monitoring leads overlie the patient. Status post median sternotomy. Stable cardiomegaly. Interval development of diffuse bilateral coarse interstitial pulmonary opacities and multifocal areas of consolidation. No definite pleural effusion or pneumothorax. Thoracic spine degenerative changes. IMPRESSION: Interval development of diffuse bilateral coarse interstitial opacities and multiple areas of pulmonary consolidation concerning for pneumonia and/or edema superimposed upon chronic pulmonary process. Electronically Signed   By: Annia Belt M.D.   On: 11/01/2017 12:57    Procedures Procedures (including critical  care time)  Medications Ordered in ED Medications  ipratropium-albuterol (DUONEB) 0.5-2.5 (3) MG/3ML nebulizer solution 3 mL (3 mLs Nebulization Given 11/01/17 1320)  dexamethasone (DECADRON) injection 10  mg (10 mg Intravenous Given 11/01/17 1445)  levofloxacin (LEVAQUIN) IVPB 750 mg (750 mg Intravenous New Bag/Given 11/01/17 1446)     Initial Impression / Assessment and Plan / ED Course  I have reviewed the triage vital signs and the nursing notes.  Pertinent labs & imaging results that were available during my care of the patient were reviewed by me and considered in my medical decision making (see chart for details).    JENNA STEIGHNER is a 61 y.o. male who presents to ED for cough and shortness of breath x 2-3 days. Afebrile in ED, however reports temperature of 100.5 at home and did take Tylenol PTA. Hemodynamically stable. Mild leukocytosis of 10.8 BNP elevated at 149. CXR shows multiple areas of of consolidation concerning for pneumonia. Clinically c/w PNA. O2 desat down to 81% with ambulation. Started on Levaquin and admitted to hospital.   Patient discussed with Dr. Clayborne Dana who agrees with treatment plan.   Final Clinical Impressions(s) / ED Diagnoses   Final diagnoses:  Cough  Multifocal pneumonia    ED Discharge Orders    None        Avian Greenawalt, [name] Picket, PA-C 11/01/17 1624    Mesner, Barbara Cower, MD 11/01/17 [phone]

## 2017-11-01 NOTE — Telephone Encounter (Signed)
Pt's sister, Orpha Bur is aware of below message and voiced her understanding.  Orpha Bur states she will call EMS if she feels symptoms worsen. Rx for Keflex and prednisone has been sent to preferred pharmacy. Nothing further is needed.

## 2017-11-01 NOTE — Telephone Encounter (Signed)
He might need to go to local ER baed on your descriotion an dhis overall health stats. But wife to make that call. They need to have real low threshold. But given his ILD he can try  cephalexin 500mg  three times daily x  5 days  Please take prednisone 40 mg x1 day, then 30 mg x1 day, then 20 mg x1 day, then 10 mg x1 day, and then 5 mg x1 day and stop

## 2017-11-01 NOTE — Plan of Care (Signed)
  Nutrition: Adequate nutrition will be maintained 11/01/2017 1850 - Progressing by Darrow Bussing, RN   Coping: Level of anxiety will decrease 11/01/2017 1850 - Progressing by Darrow Bussing, RN   Elimination: Will not experience complications related to bowel motility 11/01/2017 1850 - Progressing by Darrow Bussing, RN   Safety: Ability to remain free from injury will improve 11/01/2017 1850 - Progressing by Darrow Bussing, RN

## 2017-11-01 NOTE — ED Notes (Signed)
This RN ambulated pt in hallway. O2 sats dropped to 84%RA, resp increased along with WOB.  Pt returned to bed, O2 improved to 95% at rest.  EDP made aware.

## 2017-11-01 NOTE — Telephone Encounter (Signed)
Spoke with Pt's sister- [name] Pt's breathing has worsen in last 3 days, back pain wosen in last 6 days in mid back. Pt has productive cough mucus, unsure of color- that has worsened over last 6 days.   Pt has fever 100.5 this morning, denies chills. Pt is doing neb treatment x3 daily, on O2 at 2 liters.

## 2017-11-02 ENCOUNTER — Encounter (HOSPITAL_COMMUNITY): Payer: Self-pay | Admitting: General Practice

## 2017-11-02 ENCOUNTER — Other Ambulatory Visit: Payer: Self-pay

## 2017-11-02 DIAGNOSIS — I5042 Chronic combined systolic (congestive) and diastolic (congestive) heart failure: Secondary | ICD-10-CM

## 2017-11-02 DIAGNOSIS — J849 Interstitial pulmonary disease, unspecified: Principal | ICD-10-CM

## 2017-11-02 DIAGNOSIS — J189 Pneumonia, unspecified organism: Secondary | ICD-10-CM

## 2017-11-02 DIAGNOSIS — R059 Cough, unspecified: Secondary | ICD-10-CM

## 2017-11-02 DIAGNOSIS — J9601 Acute respiratory failure with hypoxia: Secondary | ICD-10-CM

## 2017-11-02 DIAGNOSIS — R05 Cough: Secondary | ICD-10-CM

## 2017-11-02 HISTORY — DX: Pneumonia, unspecified organism: J18.9

## 2017-11-02 LAB — RESPIRATORY PANEL BY PCR
Adenovirus: NOT DETECTED
Bordetella pertussis: NOT DETECTED
CORONAVIRUS 229E-RVPPCR: NOT DETECTED
CORONAVIRUS HKU1-RVPPCR: NOT DETECTED
CORONAVIRUS OC43-RVPPCR: NOT DETECTED
Chlamydophila pneumoniae: NOT DETECTED
Coronavirus NL63: NOT DETECTED
INFLUENZA B-RVPPCR: NOT DETECTED
Influenza A: NOT DETECTED
MYCOPLASMA PNEUMONIAE-RVPPCR: NOT DETECTED
Metapneumovirus: NOT DETECTED
PARAINFLUENZA VIRUS 1-RVPPCR: NOT DETECTED
Parainfluenza Virus 2: NOT DETECTED
Parainfluenza Virus 3: NOT DETECTED
Parainfluenza Virus 4: NOT DETECTED
RESPIRATORY SYNCYTIAL VIRUS-RVPPCR: NOT DETECTED
Rhinovirus / Enterovirus: NOT DETECTED

## 2017-11-02 LAB — BASIC METABOLIC PANEL
ANION GAP: 11 (ref 5–15)
BUN: 19 mg/dL (ref 6–20)
CALCIUM: 9.2 mg/dL (ref 8.9–10.3)
CO2: 24 mmol/L (ref 22–32)
Chloride: 101 mmol/L (ref 101–111)
Creatinine, Ser: 1.05 mg/dL (ref 0.61–1.24)
Glucose, Bld: 167 mg/dL — ABNORMAL HIGH (ref 65–99)
Potassium: 3.8 mmol/L (ref 3.5–5.1)
SODIUM: 136 mmol/L (ref 135–145)

## 2017-11-02 LAB — HIV ANTIBODY (ROUTINE TESTING W REFLEX): HIV SCREEN 4TH GENERATION: NONREACTIVE

## 2017-11-02 LAB — EXPECTORATED SPUTUM ASSESSMENT W REFEX TO RESP CULTURE

## 2017-11-02 LAB — EXPECTORATED SPUTUM ASSESSMENT W GRAM STAIN, RFLX TO RESP C

## 2017-11-02 LAB — CBC
HCT: 37.1 % — ABNORMAL LOW (ref 39.0–52.0)
HEMOGLOBIN: 12.1 g/dL — AB (ref 13.0–17.0)
MCH: 29.5 pg (ref 26.0–34.0)
MCHC: 32.6 g/dL (ref 30.0–36.0)
MCV: 90.5 fL (ref 78.0–100.0)
PLATELETS: 213 10*3/uL (ref 150–400)
RBC: 4.1 MIL/uL — AB (ref 4.22–5.81)
RDW: 14.5 % (ref 11.5–15.5)
WBC: 9.6 10*3/uL (ref 4.0–10.5)

## 2017-11-02 LAB — STREP PNEUMONIAE URINARY ANTIGEN: STREP PNEUMO URINARY ANTIGEN: NEGATIVE

## 2017-11-02 MED ORDER — HYDROCODONE-ACETAMINOPHEN 7.5-325 MG PO TABS
1.0000 | ORAL_TABLET | Freq: Four times a day (QID) | ORAL | Status: DC | PRN
  Administered 2017-11-02: 1 via ORAL
  Filled 2017-11-02: qty 1

## 2017-11-02 MED ORDER — FUROSEMIDE 10 MG/ML IJ SOLN
40.0000 mg | Freq: Every day | INTRAMUSCULAR | Status: DC
  Administered 2017-11-03: 40 mg via INTRAVENOUS
  Filled 2017-11-02: qty 4

## 2017-11-02 MED ORDER — FUROSEMIDE 10 MG/ML IJ SOLN: 40.0000 mg | Freq: Two times a day (BID) | INTRAMUSCULAR | Status: DC

## 2017-11-02 MED ORDER — AZITHROMYCIN 500 MG IV SOLR
500.0000 mg | INTRAVENOUS | Status: DC
  Administered 2017-11-02: 500 mg via INTRAVENOUS
  Filled 2017-11-02 (×2): qty 500

## 2017-11-02 MED ORDER — DEXTROSE 5 % IV SOLN
1.0000 g | INTRAVENOUS | Status: DC
  Administered 2017-11-03: 1 g via INTRAVENOUS
  Filled 2017-11-02 (×2): qty 10

## 2017-11-02 MED ORDER — POTASSIUM CHLORIDE CRYS ER 20 MEQ PO TBCR
40.0000 meq | EXTENDED_RELEASE_TABLET | Freq: Once | ORAL | Status: AC
  Administered 2017-11-02: 40 meq via ORAL
  Filled 2017-11-02: qty 2

## 2017-11-02 MED ORDER — IPRATROPIUM-ALBUTEROL 0.5-2.5 (3) MG/3ML IN SOLN
3.0000 mL | Freq: Four times a day (QID) | RESPIRATORY_TRACT | Status: DC
  Administered 2017-11-02 – 2017-11-03 (×2): 3 mL via RESPIRATORY_TRACT
  Filled 2017-11-02 (×3): qty 3

## 2017-11-02 MED ORDER — ALBUTEROL SULFATE (2.5 MG/3ML) 0.083% IN NEBU: 3.0000 mL | INHALATION_SOLUTION | RESPIRATORY_TRACT | Status: DC | PRN

## 2017-11-02 NOTE — Progress Notes (Signed)
[facility] PROGRESS NOTE  [name] OJJ:[id number] DOB: 21-Nov-1955 DOA: 11/01/2017   PCP: [name], PA-C     Assessment/Plan: Active Problems:   ILD (interstitial lung disease) (HCC)   Acute respiratory failure with hypoxia (HCC)   Chronic combined systolic and diastolic CHF (congestive heart failure) (HCC)   Pneumonia   62 y.o. male  with a PMH of interstitial lung disease, CAD s/p CABG, AAA, RA, CHF who presents to the Emergency Department complaining of progressively worsening shortness of breath times 2-3 days.  Associated with worsening productive cough and chest pain that is worse with deep inspirations.  Patient also reports a fever of 100.5 at home. Oxygen saturation 81% on room air Chest x-ray showed worsening bilateral infiltrates. Patient admitted for pneumonia  Assessment and plan PNA -started on levaquin in the ER,  change to Rocephin/azithromycin -resp viral panel pending -nebs -IV Steroids -wears O2 at night only  Follow results of blood culture  Sore throat -small petechiae in back of throat will do strep screen  ILD -follows with Dr. 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 +/- Smoking +/- mold Acute respiratory failure Continue IV Solu-Medrol followed by a slow taper as outlined by pulmonary note from 12/31  Combined acute on chronic systolic and diastolic CHF, most recent 2-D HBZJ/69/67 with a EF of 35-40% -does not look too volume overloaded but weight appears to be up -no IVF Continue diuretics, will give Lasix 40 mg IV  daily, strict I's and O's, daily weights -BNP only mildly elevated but down from prior levels Hold Aldactone and Cozaar, to allow diuresis without causing hypotension  Acute on chronic respiratory failure -wears O2 only at night -now desatting down to 80s with ambulation  Tobacco abuse -encouraged cessation  RA -follows with rheum Dr. Tresa Schlereth -chronically on  prednisone and sulfasalazine     DVT prophylaxsis Lovenox  Code Status:  Full code    Family Communication: Discussed in detail with the patient, all imaging results, lab results explained to the patient   Disposition Plan:  2-3 days      Consultants:  None  Procedures:  None  Antibiotics: Anti-infectives (From admission, onward)   Start     Dose/Rate Route Frequency Ordered Stop   11/02/17 0930  cefTRIAXone (ROCEPHIN) 1 g in dextrose 5 % 50 mL IVPB     1 g 100 mL/hr over 30 Minutes Intravenous Every 24 hours 11/02/17 0856     11/02/17 0900  azithromycin (ZITHROMAX) 500 mg in dextrose 5 % 250 mL IVPB     500 mg 250 mL/hr over 60 Minutes Intravenous Every 24 hours 11/02/17 0856     11/01/17 2200  hydroxychloroquine (PLAQUENIL) tablet 200 mg     200 mg Oral 2 times daily 11/01/17 1656     11/01/17 1700  levofloxacin (LEVAQUIN) IVPB 750 mg  Status:  Discontinued     750 mg 100 mL/hr over 90 Minutes Intravenous Every 24 hours 11/01/17 1656 11/02/17 0856   11/01/17 1400  levofloxacin (LEVAQUIN) IVPB 750 mg     750 mg 100 mL/hr over 90 Minutes Intravenous  Once 11/01/17 1350 11/01/17 1616         HPI/Subjective: Shortness of breath slightly improved, currently on 2 L of oxygen and feeling better than yesterday  Objective: Vitals:   11/01/17 2223 11/01/17 2253 11/02/17 0552 11/02/17 0600  BP: 115/74  119/71   Pulse: 93  89  Resp: 20     Temp: 97.7 F (36.5 C)  (!) 97 F (36.1 C)   TempSrc: Oral  Oral   SpO2: 96% 95% 97%   Weight:    91.8 kg (202 lb 6.1 oz)    Intake/Output Summary (Last 24 hours) at 11/02/2017 7425 Last data filed at 11/01/2017 1616 Gross per 24 hour  Intake 150 ml  Output -  Net 150 ml    Exam:  Examination:  General exam: Appears calm and comfortable  Respiratory system: Clear to auscultation. Respiratory effort normal. No wheezing Cardiovascular system: S1 & S2 heard, RRR. No JVD, murmurs, rubs, gallops or clicks. No pedal  edema. Gastrointestinal system: Abdomen is nondistended, soft and nontender. No organomegaly or masses felt. Normal bowel sounds heard. Central nervous system: Alert and oriented. No focal neurological deficits. Extremities: Symmetric 5 x 5 power. Skin: No rashes, lesions or ulcers Psychiatry: Judgement and insight appear normal. Mood & affect appropriate.     Data Reviewed: I have personally reviewed following labs and imaging studies  Micro Results Recent Results (from the past 240 hour(s))  Culture, blood (routine x 2) Call MD if unable to obtain prior to antibiotics being given     Status: None (Preliminary result)   Collection Time: 11/01/17  6:51 PM  Result Value Ref Range Status   Specimen Description BLOOD RIGHT ANTECUBITAL  Final   Special Requests   Final    BOTTLES DRAWN AEROBIC AND ANAEROBIC Blood Culture adequate volume   Culture PENDING  Incomplete   Report Status PENDING  Incomplete  Culture, sputum-assessment     Status: None   Collection Time: 11/02/17  5:49 AM  Result Value Ref Range Status   Specimen Description SPUTUM  Final   Special Requests NONE  Final   Sputum evaluation THIS SPECIMEN IS ACCEPTABLE FOR SPUTUM CULTURE  Final   Report Status 11/02/2017 FINAL  Final    Radiology Reports Dg Chest 2 View  Result Date: 11/01/2017 CLINICAL DATA:  Patient with cough and shortness of breath. EXAM: CHEST  2 VIEW COMPARISON:  Chest radiograph 02/23/202018 FINDINGS: Monitoring leads overlie the patient. Status post median sternotomy. Stable cardiomegaly. Interval development of diffuse bilateral coarse interstitial pulmonary opacities and multifocal areas of consolidation. No definite pleural effusion or pneumothorax. Thoracic spine degenerative changes. IMPRESSION: Interval development of diffuse bilateral coarse interstitial opacities and multiple areas of pulmonary consolidation concerning for pneumonia and/or edema superimposed upon chronic pulmonary process.  Electronically Signed   By: Annia Belt M.D.   On: 11/01/2017 12:57     CBC Recent Labs  Lab 11/01/17 1154 11/02/17 0459  WBC 10.8* 9.6  HGB 12.6* 12.1*  HCT 37.3* 37.1*  PLT 171 213  MCV 89.7 90.5  MCH 30.3 29.5  MCHC 33.8 32.6  RDW 14.8 14.5  LYMPHSABS 1.0  --   MONOABS 0.6  --   EOSABS 0.1  --   BASOSABS 0.0  --     Chemistries  Recent Labs  Lab 10/29/17 1428 11/01/17 1154 11/02/17 0459  NA  --  133* 136  K  --  3.9 3.8  CL  --  104 101  CO2  --  20* 24  GLUCOSE  --  120* 167*  BUN  --  15 19  CREATININE  --  1.12 1.05  CALCIUM  --  8.9 9.2  AST 4 20  --   ALT 14 24  --   ALKPHOS 88 92  --   BILITOT 0.9  1.3*  --    ------------------------------------------------------------------------------------------------------------------ estimated creatinine clearance is 83.8 mL/min (by C-G formula based on SCr of 1.05 mg/dL). ------------------------------------------------------------------------------------------------------------------ No results for input(s): HGBA1C in the last 72 hours. ------------------------------------------------------------------------------------------------------------------ No results for input(s): CHOL, HDL, LDLCALC, TRIG, CHOLHDL, LDLDIRECT in the last 72 hours. ------------------------------------------------------------------------------------------------------------------ No results for input(s): TSH, T4TOTAL, T3FREE, THYROIDAB in the last 72 hours.  Invalid input(s): FREET3 ------------------------------------------------------------------------------------------------------------------ No results for input(s): VITAMINB12, FOLATE, FERRITIN, TIBC, IRON, RETICCTPCT in the last 72 hours.  Coagulation profile No results for input(s): INR, PROTIME in the last 168 hours.  No results for input(s): DDIMER in the last 72 hours.  Cardiac Enzymes No results for input(s): CKMB, TROPONINI, MYOGLOBIN in the last 168 hours.  Invalid  input(s): CK ------------------------------------------------------------------------------------------------------------------ Invalid input(s): POCBNP   CBG: No results for input(s): GLUCAP in the last 168 hours.     Studies: Dg Chest 2 View  Result Date: 11/01/2017 CLINICAL DATA:  Patient with cough and shortness of breath. EXAM: CHEST  2 VIEW COMPARISON:  Chest radiograph 18-Dec-202018 FINDINGS: Monitoring leads overlie the patient. Status post median sternotomy. Stable cardiomegaly. Interval development of diffuse bilateral coarse interstitial pulmonary opacities and multifocal areas of consolidation. No definite pleural effusion or pneumothorax. Thoracic spine degenerative changes. IMPRESSION: Interval development of diffuse bilateral coarse interstitial opacities and multiple areas of pulmonary consolidation concerning for pneumonia and/or edema superimposed upon chronic pulmonary process. Electronically Signed   By: Annia Belt M.D.   On: 11/01/2017 12:57      No results found for: HGBA1C Lab Results  Component Value Date   LDLCALC 58 04/09/2017   CREATININE 1.05 11/02/2017       Scheduled Meds: . aspirin  81 mg Oral Daily  . atorvastatin  40 mg Oral q1800  . carvedilol  6.25 mg Oral BID WC  . enoxaparin (LOVENOX) injection  40 mg Subcutaneous Q24H  . gabapentin  300 mg Oral Q8H  . hydroxychloroquine  200 mg Oral BID  . losartan  25 mg Oral Daily  . methylPREDNISolone (SOLU-MEDROL) injection  40 mg Intravenous Q6H  . spironolactone  25 mg Oral Daily   Continuous Infusions: . azithromycin    . cefTRIAXone (ROCEPHIN)  IV       LOS: 1 day    Time spent: >30 MINS    Richarda Overlie  [facility] Pager [phone]. If 7PM-7AM, please contact night-coverage at [email or web address], password [facility] 11/02/2017, 9:09 AM  LOS: 1 day

## 2017-11-03 MED ORDER — SPIRONOLACTONE 25 MG PO TABS: 25.0000 mg | ORAL_TABLET | Freq: Every day | ORAL | 11 refills | Status: DC

## 2017-11-03 MED ORDER — CEPHALEXIN 500 MG PO CAPS: 500.0000 mg | ORAL_CAPSULE | Freq: Three times a day (TID) | ORAL | 0 refills | Status: AC

## 2017-11-03 MED ORDER — LOSARTAN POTASSIUM 25 MG PO TABS: 25.0000 mg | ORAL_TABLET | Freq: Every day | ORAL | 11 refills | Status: DC

## 2017-11-03 MED ORDER — AZITHROMYCIN 500 MG PO TABS: 500.0000 mg | ORAL_TABLET | Freq: Every day | ORAL | 0 refills | Status: AC

## 2017-11-03 NOTE — Plan of Care (Signed)
  Clinical Measurements: Will remain free from infection 11/03/2017 1125 - Adequate for Discharge by Ephraim Hamburger, RN   Clinical Measurements: Respiratory complications will improve 11/03/2017 1125 - Adequate for Discharge by Ephraim Hamburger, RN   Clinical Measurements: Diagnostic test results will improve 11/03/2017 1125 - Adequate for Discharge by Ephraim Hamburger, RN   Clinical Measurements: Cardiovascular complication will be avoided 11/03/2017 1125 - Adequate for Discharge by Ephraim Hamburger, RN

## 2017-11-03 NOTE — Discharge Summary (Signed)
Physician Discharge Summary  [name] MRN: [id number] DOB/AGE: 1956-01-19 62 y.o.  PCP: [name], PA-C   Admit date: 11/01/2017 Discharge date: 11/03/2017  Discharge Diagnoses:    Active Problems:   ILD (interstitial lung disease) ([place])   Acute respiratory failure with hypoxia ([facility])   Chronic combined systolic and diastolic CHF (congestive heart failure) ([facility])   Pneumonia   Cough    Follow-up recommendations Follow-up with PCP in 3-5 days , including all  additional recommended appointments as below Follow-up CBC, CMP in 3-5 days Follow-up with pulmonologist, [name], as scheduled Resume Cozaar and Aldactone in 3-5 days her blood pressure is stable      Allergies as of 11/03/2017   No Known Allergies     Medication List    TAKE these medications   acetaminophen 500 MG tablet Commonly known as:  TYLENOL Take 1,000 mg by mouth every 6 (six) hours as needed for mild pain.   albuterol 108 (90 Base) MCG/ACT inhaler Commonly known as:  PROVENTIL HFA;VENTOLIN HFA Inhale 2 puffs into the lungs every 6 (six) hours as needed for wheezing or shortness of breath.   albuterol 0.63 MG/3ML nebulizer solution Commonly known as:  ACCUNEB Take 3 mLs by nebulization every 6 (six) hours as needed for wheezing.   aspirin 81 MG chewable tablet Chew 1 tablet (81 mg total) by mouth daily.   atorvastatin 40 MG tablet Commonly known as:  LIPITOR Take 1 tablet (40 mg total) by mouth daily at 6 PM.   azithromycin 500 MG tablet Commonly known as:  ZITHROMAX Take 1 tablet (500 mg total) by mouth daily for 5 days. Take 1 tablet daily for 3 days.   carvedilol 6.25 MG tablet Commonly known as:  COREG Take 1 tablet (6.25 mg total) by mouth 2 (two) times daily with a meal.   cephALEXin 500 MG capsule Commonly known as:  KEFLEX Take 1 capsule (500 mg total) by mouth 3 (three) times daily for 6 days.   gabapentin 300 MG capsule Commonly known as:  NEURONTIN Take 1  capsule (300 mg total) by mouth every 8 (eight) hours.   HYDROcodone-acetaminophen 7.5-325 MG tablet Commonly known as:  NORCO Take 1 tablet by mouth 2 (two) times daily as needed for moderate pain. DNF: 09/28/17, 10/27/17 For chronic pain What changed:    when to take this  additional instructions   hydroxychloroquine 200 MG tablet Commonly known as:  PLAQUENIL Take 200 mg by mouth 2 (two) times daily.   losartan 25 MG tablet Commonly known as:  COZAAR Take 1 tablet (25 mg total) by mouth daily. Start taking on:  11/08/2017 What changed:  These instructions start on 11/08/2017. If you are unsure what to do until then, ask your doctor or other care provider.   MUSCLE RUB EX Apply 1 application topically as needed (for shoulder or knee pain).   OXYGEN Inhale 2 L into the lungs at bedtime.   predniSONE 5 MG tablet Commonly known as:  DELTASONE Take 2 tablets (56m) twice daily.   predniSONE 10 MG tablet Commonly known as:  DELTASONE 4 tabs x 1 day, 3 tabs x 1 day, 2 tabs x 1 day, 1 tab x 1 day then 0.5 x 1 day then stop   spironolactone 25 MG tablet Commonly known as:  ALDACTONE Take 1 tablet (25 mg total) by mouth daily. Start taking on:  11/08/2017 What changed:  These instructions start on 11/08/2017. If you are unsure what to  do until then, ask your doctor or other care provider.        Discharge Condition: Stable  Discharge Instructions Get Medicines reviewed and adjusted: Please take all your medications with you for your next visit with your Primary MD  Please request your Primary MD to go over all hospital tests and procedure/radiological results at the follow up, please ask your Primary MD to get all Hospital records sent to his/her office.  If you experience worsening of your admission symptoms, develop shortness of breath, life threatening emergency, suicidal or homicidal thoughts you must seek medical attention immediately by calling 911 or calling your MD  immediately if symptoms less severe.  You must read complete instructions/literature along with all the possible adverse reactions/side effects for all the Medicines you take and that have been prescribed to you. Take any new Medicines after you have completely understood and accpet all the possible adverse reactions/side effects.   Do not drive when taking Pain medications.   Do not take more than prescribed Pain, Sleep and Anxiety Medications  Special Instructions: If you have smoked or chewed Tobacco in the last 2 yrs please stop smoking, stop any regular Alcohol and or any Recreational drug use.  Wear Seat belts while driving.  Please note  You were cared for by a hospitalist during your hospital stay. Once you are discharged, your primary care physician will handle any further medical issues. Please note that NO REFILLS for any discharge medications will be authorized once you are discharged, as it is imperative that you return to your primary care physician (or establish a relationship with a primary care physician if you do not have one) for your aftercare needs so that they can reassess your need for medications and monitor your lab values.     No Known Allergies    Disposition: 01-Home or Self Care   Consults:   None    Significant Diagnostic Studies:  Dg Chest 2 View  Result Date: 11/01/2017 CLINICAL DATA:  Patient with cough and shortness of breath. EXAM: CHEST  2 VIEW COMPARISON:  Chest radiograph 09/08/2017 FINDINGS: Monitoring leads overlie the patient. Status post median sternotomy. Stable cardiomegaly. Interval development of diffuse bilateral coarse interstitial pulmonary opacities and multifocal areas of consolidation. No definite pleural effusion or pneumothorax. Thoracic spine degenerative changes. IMPRESSION: Interval development of diffuse bilateral coarse interstitial opacities and multiple areas of pulmonary consolidation concerning for pneumonia and/or  edema superimposed upon chronic pulmonary process. Electronically Signed   By: Lovey Newcomer M.D.   On: 11/01/2017 12:57        Filed Weights   11/02/17 0600 11/03/17 0008  Weight: 91.8 kg (202 lb 6.1 oz) 91.5 kg (201 lb 11.5 oz)     Microbiology: Recent Results (from the past 240 hour(s))  Respiratory Panel by PCR     Status: None   Collection Time: 11/01/17  6:15 PM  Result Value Ref Range Status   Adenovirus NOT DETECTED NOT DETECTED Final   Coronavirus 229E NOT DETECTED NOT DETECTED Final   Coronavirus HKU1 NOT DETECTED NOT DETECTED Final   Coronavirus NL63 NOT DETECTED NOT DETECTED Final   Coronavirus OC43 NOT DETECTED NOT DETECTED Final   Metapneumovirus NOT DETECTED NOT DETECTED Final   Rhinovirus / Enterovirus NOT DETECTED NOT DETECTED Final   Influenza A NOT DETECTED NOT DETECTED Final   Influenza B NOT DETECTED NOT DETECTED Final   Parainfluenza Virus 1 NOT DETECTED NOT DETECTED Final   Parainfluenza Virus 2 NOT DETECTED  NOT DETECTED Final   Parainfluenza Virus 3 NOT DETECTED NOT DETECTED Final   Parainfluenza Virus 4 NOT DETECTED NOT DETECTED Final   Respiratory Syncytial Virus NOT DETECTED NOT DETECTED Final   Bordetella pertussis NOT DETECTED NOT DETECTED Final   Chlamydophila pneumoniae NOT DETECTED NOT DETECTED Final   Mycoplasma pneumoniae NOT DETECTED NOT DETECTED Final  Culture, blood (routine x 2) Call MD if unable to obtain prior to antibiotics being given     Status: None (Preliminary result)   Collection Time: 11/01/17  6:51 PM  Result Value Ref Range Status   Specimen Description BLOOD RIGHT ANTECUBITAL  Final   Special Requests   Final    BOTTLES DRAWN AEROBIC AND ANAEROBIC Blood Culture adequate volume   Culture NO GROWTH < 24 HOURS  Final   Report Status PENDING  Incomplete  Culture, blood (routine x 2) Call MD if unable to obtain prior to antibiotics being given     Status: None (Preliminary result)   Collection Time: 11/01/17  7:00 PM  Result  Value Ref Range Status   Specimen Description BLOOD LEFT ANTECUBITAL  Final   Special Requests   Final    BOTTLES DRAWN AEROBIC AND ANAEROBIC Blood Culture adequate volume   Culture NO GROWTH < 24 HOURS  Final   Report Status PENDING  Incomplete  Culture, sputum-assessment     Status: None   Collection Time: 11/02/17  5:49 AM  Result Value Ref Range Status   Specimen Description SPUTUM  Final   Special Requests NONE  Final   Sputum evaluation THIS SPECIMEN IS ACCEPTABLE FOR SPUTUM CULTURE  Final   Report Status 11/02/2017 FINAL  Final  Culture, respiratory (NON-Expectorated)     Status: None (Preliminary result)   Collection Time: 11/02/17  5:49 AM  Result Value Ref Range Status   Specimen Description SPUTUM  Final   Special Requests NONE Reflexed from [id number]  Final   Gram Stain   Final    FEW WBC PRESENT, PREDOMINANTLY PMN FEW GRAM NEGATIVE RODS FEW GRAM POSITIVE COCCI IN PAIRS IN CLUSTERS RARE GRAM POSITIVE RODS    Culture PENDING  Incomplete   Report Status PENDING  Incomplete       Blood Culture    Component Value Date/Time   SDES SPUTUM 11/02/2017 0549   SDES SPUTUM 11/02/2017 0549   SPECREQUEST NONE 11/02/2017 0549   SPECREQUEST NONE Reflexed from [id number] 11/02/2017 0549   CULT PENDING 11/02/2017 0549   REPTSTATUS 11/02/2017 FINAL 11/02/2017 0549   REPTSTATUS PENDING 11/02/2017 0549      Labs: Results for orders placed or performed during the hospital encounter of 11/01/17 (from the past 48 hour(s))  Comprehensive metabolic panel     Status: Abnormal   Collection Time: 11/01/17 11:54 AM  Result Value Ref Range   Sodium 133 (L) 135 - 145 mmol/L   Potassium 3.9 3.5 - 5.1 mmol/L   Chloride 104 101 - 111 mmol/L   CO2 20 (L) 22 - 32 mmol/L   Glucose, Bld 120 (H) 65 - 99 mg/dL   BUN 15 6 - 20 mg/dL   Creatinine, Ser 1.12 0.61 - 1.24 mg/dL   Calcium 8.9 8.9 - 10.3 mg/dL   Total Protein 7.5 6.5 - 8.1 g/dL   Albumin 3.3 (L) 3.5 - 5.0 g/dL   AST 20 15 - 41 U/L    ALT 24 17 - 63 U/L   Alkaline Phosphatase 92 38 - 126 U/L   Total Bilirubin 1.3 (H) 0.3 -  1.2 mg/dL   GFR calc non Af Amer >60 >60 mL/min   GFR calc Af Amer >60 >60 mL/min    Comment: (NOTE) The eGFR has been calculated using the CKD EPI equation. This calculation has not been validated in all clinical situations. eGFR's persistently <60 mL/min signify possible Chronic Kidney Disease.    Anion gap 9 5 - 15  CBC with Differential     Status: Abnormal   Collection Time: 11/01/17 11:54 AM  Result Value Ref Range   WBC 10.8 (H) 4.0 - 10.5 K/uL   RBC 4.16 (L) 4.22 - 5.81 MIL/uL   Hemoglobin 12.6 (L) 13.0 - 17.0 g/dL   HCT 37.3 (L) 39.0 - 52.0 %   MCV 89.7 78.0 - 100.0 fL   MCH 30.3 26.0 - 34.0 pg   MCHC 33.8 30.0 - 36.0 g/dL   RDW 14.8 11.5 - 15.5 %   Platelets 171 150 - 400 K/uL   Neutrophils Relative % 84 %   Neutro Abs 9.1 (H) 1.7 - 7.7 K/uL   Lymphocytes Relative 9 %   Lymphs Abs 1.0 0.7 - 4.0 K/uL   Monocytes Relative 6 %   Monocytes Absolute 0.6 0.1 - 1.0 K/uL   Eosinophils Relative 1 %   Eosinophils Absolute 0.1 0.0 - 0.7 K/uL   Basophils Relative 0 %   Basophils Absolute 0.0 0.0 - 0.1 K/uL  Brain natriuretic peptide     Status: Abnormal   Collection Time: 11/01/17 11:54 AM  Result Value Ref Range   B Natriuretic Peptide 149.9 (H) 0.0 - 100.0 pg/mL  I-stat troponin, ED     Status: None   Collection Time: 11/01/17 12:09 PM  Result Value Ref Range   Troponin i, poc 0.03 0.00 - 0.08 ng/mL   Comment 3            Comment: Due to the release kinetics of cTnI, a negative result within the first hours of the onset of symptoms does not rule out myocardial infarction with certainty. If myocardial infarction is still suspected, repeat the test at appropriate intervals.   I-Stat CG4 Lactic Acid, ED     Status: None   Collection Time: 11/01/17 12:11 PM  Result Value Ref Range   Lactic Acid, Venous 1.71 0.5 - 1.9 mmol/L  Lipase, blood     Status: None   Collection Time:  11/01/17  2:10 PM  Result Value Ref Range   Lipase 26 11 - 51 U/L  Respiratory Panel by PCR     Status: None   Collection Time: 11/01/17  6:15 PM  Result Value Ref Range   Adenovirus NOT DETECTED NOT DETECTED   Coronavirus 229E NOT DETECTED NOT DETECTED   Coronavirus HKU1 NOT DETECTED NOT DETECTED   Coronavirus NL63 NOT DETECTED NOT DETECTED   Coronavirus OC43 NOT DETECTED NOT DETECTED   Metapneumovirus NOT DETECTED NOT DETECTED   Rhinovirus / Enterovirus NOT DETECTED NOT DETECTED   Influenza A NOT DETECTED NOT DETECTED   Influenza B NOT DETECTED NOT DETECTED   Parainfluenza Virus 1 NOT DETECTED NOT DETECTED   Parainfluenza Virus 2 NOT DETECTED NOT DETECTED   Parainfluenza Virus 3 NOT DETECTED NOT DETECTED   Parainfluenza Virus 4 NOT DETECTED NOT DETECTED   Respiratory Syncytial Virus NOT DETECTED NOT DETECTED   Bordetella pertussis NOT DETECTED NOT DETECTED   Chlamydophila pneumoniae NOT DETECTED NOT DETECTED   Mycoplasma pneumoniae NOT DETECTED NOT DETECTED  Procalcitonin - Baseline     Status: None  Collection Time: 11/01/17  6:51 PM  Result Value Ref Range   Procalcitonin 0.11 ng/mL    Comment:        Interpretation: PCT (Procalcitonin) <= 0.5 ng/mL: Systemic infection (sepsis) is not likely. Local bacterial infection is possible. (NOTE)       Sepsis PCT Algorithm           Lower Respiratory Tract                                      Infection PCT Algorithm    ----------------------------     ----------------------------         PCT < 0.25 ng/mL                PCT < 0.10 ng/mL         Strongly encourage             Strongly discourage   discontinuation of antibiotics    initiation of antibiotics    ----------------------------     -----------------------------       PCT 0.25 - 0.50 ng/mL            PCT 0.10 - 0.25 ng/mL               OR       >80% decrease in PCT            Discourage initiation of                                            antibiotics      Encourage  discontinuation           of antibiotics    ----------------------------     -----------------------------         PCT >= 0.50 ng/mL              PCT 0.26 - 0.50 ng/mL               AND        <80% decrease in PCT             Encourage initiation of                                             antibiotics       Encourage continuation           of antibiotics    ----------------------------     -----------------------------        PCT >= 0.50 ng/mL                  PCT > 0.50 ng/mL               AND         increase in PCT                  Strongly encourage                                      initiation of antibiotics    Strongly encourage escalation  of antibiotics                                     -----------------------------                                           PCT <= 0.25 ng/mL                                                 OR                                        > 80% decrease in PCT                                     Discontinue / Do not initiate                                             antibiotics   HIV antibody     Status: None   Collection Time: 11/01/17  6:51 PM  Result Value Ref Range   HIV Screen 4th Generation wRfx Non Reactive Non Reactive    Comment: (NOTE) Performed At: [facility] [place], [place] [id number] Rush Farmer MD [id number]   Culture, blood (routine x 2) Call MD if unable to obtain prior to antibiotics being given     Status: None (Preliminary result)   Collection Time: 11/01/17  6:51 PM  Result Value Ref Range   Specimen Description BLOOD RIGHT ANTECUBITAL    Special Requests      BOTTLES DRAWN AEROBIC AND ANAEROBIC Blood Culture adequate volume   Culture NO GROWTH < 24 HOURS    Report Status PENDING   Culture, blood (routine x 2) Call MD if unable to obtain prior to antibiotics being given     Status: None (Preliminary result)   Collection Time: 11/01/17  7:00 PM  Result Value Ref Range    Specimen Description BLOOD LEFT ANTECUBITAL    Special Requests      BOTTLES DRAWN AEROBIC AND ANAEROBIC Blood Culture adequate volume   Culture NO GROWTH < 24 HOURS    Report Status PENDING   Basic metabolic panel     Status: Abnormal   Collection Time: 11/02/17  4:59 AM  Result Value Ref Range   Sodium 136 135 - 145 mmol/L   Potassium 3.8 3.5 - 5.1 mmol/L   Chloride 101 101 - 111 mmol/L   CO2 24 22 - 32 mmol/L   Glucose, Bld 167 (H) 65 - 99 mg/dL   BUN 19 6 - 20 mg/dL   Creatinine, Ser 1.05 0.61 - 1.24 mg/dL   Calcium 9.2 8.9 - 10.3 mg/dL   GFR calc non Af Amer >60 >60 mL/min   GFR calc Af Amer >60 >60 mL/min    Comment: (NOTE) The eGFR has been calculated using the CKD  EPI equation. This calculation has not been validated in all clinical situations. eGFR's persistently <60 mL/min signify possible Chronic Kidney Disease.    Anion gap 11 5 - 15  CBC     Status: Abnormal   Collection Time: 11/02/17  4:59 AM  Result Value Ref Range   WBC 9.6 4.0 - 10.5 K/uL   RBC 4.10 (L) 4.22 - 5.81 MIL/uL   Hemoglobin 12.1 (L) 13.0 - 17.0 g/dL   HCT 37.1 (L) 39.0 - 52.0 %   MCV 90.5 78.0 - 100.0 fL   MCH 29.5 26.0 - 34.0 pg   MCHC 32.6 30.0 - 36.0 g/dL   RDW 14.5 11.5 - 15.5 %   Platelets 213 150 - 400 K/uL  Strep pneumoniae urinary antigen     Status: None   Collection Time: 11/02/17  5:35 AM  Result Value Ref Range   Strep Pneumo Urinary Antigen NEGATIVE NEGATIVE    Comment:        Infection due to S. pneumoniae cannot be absolutely ruled out since the antigen present may be below the detection limit of the test.   Culture, sputum-assessment     Status: None   Collection Time: 11/02/17  5:49 AM  Result Value Ref Range   Specimen Description SPUTUM    Special Requests NONE    Sputum evaluation THIS SPECIMEN IS ACCEPTABLE FOR SPUTUM CULTURE    Report Status 11/02/2017 FINAL   Culture, respiratory (NON-Expectorated)     Status: None (Preliminary result)   Collection Time:  11/02/17  5:49 AM  Result Value Ref Range   Specimen Description SPUTUM    Special Requests NONE Reflexed from [id number]    Gram Stain      FEW WBC PRESENT, PREDOMINANTLY PMN FEW GRAM NEGATIVE RODS FEW GRAM POSITIVE COCCI IN PAIRS IN CLUSTERS RARE GRAM POSITIVE RODS    Culture PENDING    Report Status PENDING      Lipid Panel     Component Value Date/Time   CHOL 115 04/09/2017 1400   TRIG 163 (H) 04/09/2017 1400   HDL 24 (L) 04/09/2017 1400   CHOLHDL 4.8 04/09/2017 1400   CHOLHDL 5.1 02/27/2017 0502   VLDL 29 02/27/2017 0502   LDLCALC 58 04/09/2017 1400     No results found for: HGBA1C   Lab Results  Component Value Date   LDLCALC 58 04/09/2017   CREATININE 1.05 11/02/2017     HPI :  62 y.o.malewith a PMH of interstitial lung disease, CAD s/p CABG, AAA, RA, CHFwho presents to the Emergency Department complaining of progressively worsening shortness of breath times 2-3 days. Associated with worsening productive cough and chest pain that is worse with deep inspirations. Patient also reports a fever of 100.5 at home. Oxygen saturation 81% on room air Chest x-ray showed worsening bilateral infiltrates. Patient admitted for pneumonia    HOSPITAL COURSE: *   Community acquired PNA -started on levaquin in the ER,  changed to Rocephin/azithromycin -resp viral panel negative -nebs -IV Steroids, switched to prednisone taper as outlined by [name] prior to admission -wears O2 at night only One out of 2 bottles positive for gram-positive cocci, likely contaminant Pro calcitonin 0.11, lactic acid 1.71  Sore throat -small petechiae in back of throat will do strep screen  ILD -follows with 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 +/- Smoking +/- mold Acute respiratory failure Continue IV Solu-Medrol followed by a slow taper as outlined by pulmonary note from 12/31  Combined acute on chronic systolic and diastolic CHF, most recent  2-D echo/27/18 with a EF of 35-40% -does not look too volume overloaded but weight appears to be up -no IVF Treated with diuretics Lasix 40 mg IV  daily, strict I's and O's, daily weights -BNP only mildly elevated but down from prior levels Held Aldactone and Cozaar, to allow diuresis without causing hypotension Blood pressure remains soft and therefore patient will hold both of these medications for another 5-7 days, pending clearance from PCP  Acute on chronic respiratory failure -wears O2 only at night -now desatting down to 80s with ambulation  Tobacco abuse -encouraged cessation  RA -follows with rheum Dr. Annalee Genta -chronically on prednisone and sulfasalazine    Discharge Exam:   Blood pressure 109/71, pulse 67, temperature 97.7 F (36.5 C), temperature source Oral, resp. rate 16, weight 91.5 kg (201 lb 11.5 oz), SpO2 91 %.  General exam: Appears calm and comfortable  Respiratory system: Clear to auscultation. Respiratory effort normal. No wheezing Cardiovascular system: S1 & S2 heard, RRR. No JVD, murmurs, rubs, gallops or clicks. No pedal edema. Gastrointestinal system: Abdomen is nondistended, soft and nontender. No organomegaly or masses felt. Normal bowel sounds heard. Central nervous system: Alert and oriented. No focal neurological deficits. Extremities: Symmetric 5 x 5 power. Skin: No rashes, lesions or ulcers        Signed: Reyne Dumas 11/03/2017, 9:50 AM        Time spent >1 hour

## 2017-11-03 NOTE — Progress Notes (Signed)
Pt ambulated in hallway with RN, O2 sats ranged from 97-100% on RA.  Pt given discharge instructions and gone over with him and wife. Answered all questions to satisfaction. All belongings gathered and sent home with him. Pt in no distress at time of discharge.

## 2017-11-04 ENCOUNTER — Telehealth: Payer: Self-pay | Admitting: Gastroenterology

## 2017-11-04 LAB — CULTURE, RESPIRATORY: CULTURE: NORMAL

## 2017-11-04 LAB — CULTURE, RESPIRATORY W GRAM STAIN

## 2017-11-04 NOTE — Telephone Encounter (Signed)
FYI

## 2017-11-05 NOTE — Telephone Encounter (Signed)
Okay thanks for letting me know. If he changes his mind in the future he can contact us to coordinate it. Otherwise he can follow up with me as needed

## 2017-11-06 LAB — CULTURE, BLOOD (ROUTINE X 2)
Culture: NO GROWTH
Culture: NO GROWTH
Special Requests: ADEQUATE
Special Requests: ADEQUATE

## 2017-11-18 ENCOUNTER — Encounter: Payer: Medicaid Other | Admitting: Student in an Organized Health Care Education/Training Program

## 2017-11-23 ENCOUNTER — Ambulatory Visit
Payer: Medicaid Other | Attending: Student in an Organized Health Care Education/Training Program | Admitting: Student in an Organized Health Care Education/Training Program

## 2017-11-23 ENCOUNTER — Encounter: Payer: Self-pay | Admitting: Student in an Organized Health Care Education/Training Program

## 2017-11-23 ENCOUNTER — Other Ambulatory Visit: Payer: Self-pay

## 2017-11-23 VITALS — BP 136/84 | HR 84 | Temp 97.8°F | Resp 16 | Ht 73.0 in | Wt 202.0 lb

## 2017-11-23 DIAGNOSIS — M25562 Pain in left knee: Secondary | ICD-10-CM | POA: Diagnosis not present

## 2017-11-23 DIAGNOSIS — Z7982 Long term (current) use of aspirin: Secondary | ICD-10-CM | POA: Insufficient documentation

## 2017-11-23 DIAGNOSIS — Z8673 Personal history of transient ischemic attack (TIA), and cerebral infarction without residual deficits: Secondary | ICD-10-CM | POA: Diagnosis not present

## 2017-11-23 DIAGNOSIS — M25561 Pain in right knee: Secondary | ICD-10-CM | POA: Diagnosis not present

## 2017-11-23 DIAGNOSIS — M25511 Pain in right shoulder: Secondary | ICD-10-CM | POA: Diagnosis not present

## 2017-11-23 DIAGNOSIS — Z5181 Encounter for therapeutic drug level monitoring: Secondary | ICD-10-CM | POA: Diagnosis present

## 2017-11-23 DIAGNOSIS — I5042 Chronic combined systolic (congestive) and diastolic (congestive) heart failure: Secondary | ICD-10-CM | POA: Insufficient documentation

## 2017-11-23 DIAGNOSIS — M79642 Pain in left hand: Secondary | ICD-10-CM | POA: Insufficient documentation

## 2017-11-23 DIAGNOSIS — M170 Bilateral primary osteoarthritis of knee: Secondary | ICD-10-CM | POA: Diagnosis not present

## 2017-11-23 DIAGNOSIS — M069 Rheumatoid arthritis, unspecified: Secondary | ICD-10-CM | POA: Insufficient documentation

## 2017-11-23 DIAGNOSIS — Z79899 Other long term (current) drug therapy: Secondary | ICD-10-CM | POA: Diagnosis not present

## 2017-11-23 DIAGNOSIS — M25542 Pain in joints of left hand: Secondary | ICD-10-CM

## 2017-11-23 DIAGNOSIS — Z8739 Personal history of other diseases of the musculoskeletal system and connective tissue: Secondary | ICD-10-CM | POA: Diagnosis not present

## 2017-11-23 DIAGNOSIS — I110 Hypertensive heart disease with heart failure: Secondary | ICD-10-CM | POA: Diagnosis not present

## 2017-11-23 DIAGNOSIS — M25512 Pain in left shoulder: Secondary | ICD-10-CM | POA: Insufficient documentation

## 2017-11-23 DIAGNOSIS — K219 Gastro-esophageal reflux disease without esophagitis: Secondary | ICD-10-CM | POA: Diagnosis not present

## 2017-11-23 DIAGNOSIS — G894 Chronic pain syndrome: Secondary | ICD-10-CM | POA: Diagnosis not present

## 2017-11-23 DIAGNOSIS — M25541 Pain in joints of right hand: Secondary | ICD-10-CM

## 2017-11-23 DIAGNOSIS — F17210 Nicotine dependence, cigarettes, uncomplicated: Secondary | ICD-10-CM | POA: Insufficient documentation

## 2017-11-23 MED ORDER — HYDROCODONE-ACETAMINOPHEN 7.5-325 MG PO TABS: 1.0000 | ORAL_TABLET | Freq: Three times a day (TID) | ORAL | 0 refills | Status: DC | PRN

## 2017-11-23 NOTE — Progress Notes (Signed)
Nursing Pain Medication Assessment:  Safety precautions to be maintained throughout the outpatient stay will include: orient to surroundings, keep bed in low position, maintain call bell within reach at all times, provide assistance with transfer out of bed and ambulation.  Medication Inspection Compliance: Pill count conducted under aseptic conditions, in front of the patient. Neither the pills nor the bottle was removed from the patient's sight at any time. Once count was completed pills were immediately returned to the patient in their original bottle.  Medication: See above Pill/Patch Count: 7 of 60 pills remain Pill/Patch Appearance: Markings consistent with prescribed medication Bottle Appearance: Standard pharmacy container. Clearly labeled. Filled Date: 34 / 26 / 2018 Last Medication intake:  Today

## 2017-11-23 NOTE — Progress Notes (Signed)
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 arthritis symptoms are worsening and he is experiencing right metacarpal pain as well as right elbow pain.  This is very severe for him.  He has been taking his medications as prescribed but feels like he needs another dose in the evening as his pain is often waking him up during sleep.  We will increase the patient's hydrocodone to 7.5 mg 3 times daily as needed (from twice daily as needed).  No further dose escalation beyond this.  Plan: -Prescription for hydrocodone at increased dose as below.  No further dose escalation beyond this.  7.5 mg 3 times daily as needed, quantity 90 a month.  Prescription for 2 months. -Continue gabapentin as prescribed.   -follow-up in 2 months for medication management.    Plan of Care  Pharmacotherapy (Medications Ordered): Meds ordered this encounter  Medications  . DISCONTD: HYDROcodone-acetaminophen (NORCO) 7.5-325 MG tablet    Sig: Take 1 tablet by mouth 3  (three) times daily as needed for moderate pain. DNF: 11/26/17, 12/26/17 For chronic pain Ok to fill 1 day early if pharmacy closed on fill date    Dispense:  90 tablet    Refill:  0  . HYDROcodone-acetaminophen (NORCO) 7.5-325 MG tablet    Sig: Take 1 tablet by mouth 3 (three) times daily as needed for moderate pain. DNF: 11/26/17, 12/26/17 For chronic pain Ok to fill 1 day early if pharmacy closed on fill date    Dispense:  90 tablet    Refill:  0    Time Note: Greater than 50% of the 25 minute(s) of face-to-face time spent with Mr. Rushing, was spent in counseling/coordination of care regarding: the appropriate use of the pain scale, the treatment plan, treatment alternatives, medication side effects, the opioid analgesic risks and possible complications, the appropriate use of his medications and the medication agreement.  Provider-requested follow-up: Return in about 8 weeks (around 01/18/2018) for Medication Management.  Future Appointments  Date Time Provider [place]  12/15/2017 10:15 AM [facility] None  12/15/2017 10:45 AM Truitt Merle, MD [facility] None  12/22/2017 11:00 AM [facility] None  12/22/2017 11:30 AM Brand Males, MD [facility] None  01/18/2018  1:45 PM Gillis Santa, MD [facility] None  02/09/2018  8:00 AM [facility] 4 [facility]  02/09/2018  9:00 AM [facility]  02/09/2018  9:30 AM Conrad [place], MD [facility]    Primary Care Physician: [name], PA-C Location: [facility] Note by: Gillis Santa, M.D Date: 11/23/2017; Time: 3:08 PM  There are no Patient Instructions on file for this visit.

## 2017-11-26 ENCOUNTER — Telehealth: Payer: Self-pay | Admitting: Student in an Organized Health Care Education/Training Program

## 2017-11-26 NOTE — Telephone Encounter (Signed)
Patient is out of meds and need prior auth per Pharmacy. Would appreciate if could do soon as possible.

## 2017-11-26 NOTE — Telephone Encounter (Signed)
PA for Hydrocodone sent.

## 2017-11-30 ENCOUNTER — Telehealth: Payer: Self-pay

## 2017-11-30 ENCOUNTER — Other Ambulatory Visit: Payer: Self-pay | Admitting: Student in an Organized Health Care Education/Training Program

## 2017-11-30 MED ORDER — GABAPENTIN 300 MG PO CAPS: 300.0000 mg | ORAL_CAPSULE | Freq: Three times a day (TID) | ORAL | 4 refills | Status: DC

## 2017-11-30 NOTE — Telephone Encounter (Signed)
Needs Prior Auth on his meds and needs gabapentin

## 2017-11-30 NOTE — Telephone Encounter (Signed)
Spoke with [name] to let her know that PA was sent on 11/26/17.  Patient states he needs refill on Gabapentin which was last prescribed on 06/24/18.

## 2017-12-01 NOTE — Telephone Encounter (Signed)
PA sent on 11/26/2017 per DW. Will call and check with [place] [facility] today. Patient called and let know.

## 2017-12-01 NOTE — Telephone Encounter (Signed)
Patients Sister Jinny Sanders on 11-30-17 stating patient still unable to get medications

## 2017-12-02 ENCOUNTER — Telehealth: Payer: Self-pay | Admitting: Student in an Organized Health Care Education/Training Program

## 2017-12-02 NOTE — Telephone Encounter (Signed)
Patient's sister Thana Farr called stating they got a certified letter from [facility], would like to speak with someone about this, please call asap

## 2017-12-02 NOTE — Telephone Encounter (Signed)
Medication was sent again for reprocessing for PA. Patient's sister instructed to give [place] [facility] time to re PA it and to check again tomorrow.

## 2017-12-06 ENCOUNTER — Telehealth: Payer: Self-pay | Admitting: Nurse Practitioner

## 2017-12-06 NOTE — Telephone Encounter (Signed)
Patient got another denial for his meds, has nothing for pain, Please call his Sister Olegario Messier to discuss.

## 2017-12-06 NOTE — Telephone Encounter (Signed)
After calling [facility] to find out about PA for hydrocodone = apap 5-325 I called the patient to give information that I learned.   1.  We had to send another PA today with office notes to define why patient needs to continue on pain management medication.   2.  There is an existing PA thru 12/25/17 for qty 60.   3.  Need to give PA that we sent today 24 hours to go through.  After talking with the patient about this, she is concerned that if she gets the qty of 60 that is currently in place what will happen to the rest of the RX.    Also we discussed the possibility of a same month refill if the remaining qty of 30 is rewritten.    I did tell the patient that I would let Dr Cherylann Ratel know the issue and we will get feedback from him as to what he thinks and will do.    Patient states that she is going to try this afternoon with [facility]  To see if PA goes through and then in the morning.  Offered that it may take 24 hours turn around time and the PA was sent today around 1230.  Patient's sister verbalizes u/o all the information as well as the process for a PA.
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
## 2017-12-15 ENCOUNTER — Encounter: Payer: Self-pay | Admitting: Hematology

## 2017-12-15 ENCOUNTER — Inpatient Hospital Stay: Payer: Medicaid Other | Attending: Hematology | Admitting: Hematology

## 2017-12-15 ENCOUNTER — Inpatient Hospital Stay: Payer: Medicaid Other

## 2017-12-15 DIAGNOSIS — D72829 Elevated white blood cell count, unspecified: Secondary | ICD-10-CM | POA: Diagnosis not present

## 2017-12-15 DIAGNOSIS — I5042 Chronic combined systolic (congestive) and diastolic (congestive) heart failure: Secondary | ICD-10-CM | POA: Diagnosis not present

## 2017-12-15 DIAGNOSIS — Z8781 Personal history of (healed) traumatic fracture: Secondary | ICD-10-CM | POA: Diagnosis not present

## 2017-12-15 DIAGNOSIS — R531 Weakness: Secondary | ICD-10-CM | POA: Diagnosis not present

## 2017-12-15 DIAGNOSIS — Z8673 Personal history of transient ischemic attack (TIA), and cerebral infarction without residual deficits: Secondary | ICD-10-CM

## 2017-12-15 DIAGNOSIS — K5900 Constipation, unspecified: Secondary | ICD-10-CM | POA: Insufficient documentation

## 2017-12-15 DIAGNOSIS — K219 Gastro-esophageal reflux disease without esophagitis: Secondary | ICD-10-CM | POA: Diagnosis not present

## 2017-12-15 DIAGNOSIS — I110 Hypertensive heart disease with heart failure: Secondary | ICD-10-CM | POA: Insufficient documentation

## 2017-12-15 DIAGNOSIS — G629 Polyneuropathy, unspecified: Secondary | ICD-10-CM | POA: Diagnosis not present

## 2017-12-15 DIAGNOSIS — Z79899 Other long term (current) drug therapy: Secondary | ICD-10-CM | POA: Insufficient documentation

## 2017-12-15 DIAGNOSIS — M069 Rheumatoid arthritis, unspecified: Secondary | ICD-10-CM | POA: Insufficient documentation

## 2017-12-15 DIAGNOSIS — I2510 Atherosclerotic heart disease of native coronary artery without angina pectoris: Secondary | ICD-10-CM | POA: Insufficient documentation

## 2017-12-15 DIAGNOSIS — D509 Iron deficiency anemia, unspecified: Secondary | ICD-10-CM | POA: Diagnosis not present

## 2017-12-15 DIAGNOSIS — Z9981 Dependence on supplemental oxygen: Secondary | ICD-10-CM | POA: Diagnosis not present

## 2017-12-15 DIAGNOSIS — J849 Interstitial pulmonary disease, unspecified: Secondary | ICD-10-CM | POA: Insufficient documentation

## 2017-12-15 DIAGNOSIS — I252 Old myocardial infarction: Secondary | ICD-10-CM | POA: Insufficient documentation

## 2017-12-15 DIAGNOSIS — Z7982 Long term (current) use of aspirin: Secondary | ICD-10-CM | POA: Insufficient documentation

## 2017-12-15 DIAGNOSIS — I714 Abdominal aortic aneurysm, without rupture: Secondary | ICD-10-CM

## 2017-12-15 DIAGNOSIS — F17210 Nicotine dependence, cigarettes, uncomplicated: Secondary | ICD-10-CM | POA: Diagnosis not present

## 2017-12-15 LAB — CBC WITH DIFFERENTIAL/PLATELET
Basophils Absolute: 0 10*3/uL (ref 0.0–0.1)
Basophils Relative: 1 %
EOS ABS: 0.1 10*3/uL (ref 0.0–0.5)
EOS PCT: 1 %
HCT: 40.9 % (ref 38.4–49.9)
Hemoglobin: 13.7 g/dL (ref 13.0–17.1)
LYMPHS ABS: 1.5 10*3/uL (ref 0.9–3.3)
Lymphocytes Relative: 17 %
MCH: 28.9 pg (ref 27.2–33.4)
MCHC: 33.6 g/dL (ref 32.0–36.0)
MCV: 86.2 fL (ref 79.3–98.0)
MONO ABS: 0.4 10*3/uL (ref 0.1–0.9)
MONOS PCT: 5 %
Neutro Abs: 6.9 10*3/uL — ABNORMAL HIGH (ref 1.5–6.5)
Neutrophils Relative %: 76 %
PLATELETS: 174 10*3/uL (ref 140–400)
RBC: 4.74 MIL/uL (ref 4.20–5.82)
RDW: 15.5 % — ABNORMAL HIGH (ref 11.0–14.6)
WBC: 9 10*3/uL (ref 4.0–10.3)

## 2017-12-16 ENCOUNTER — Telehealth: Payer: Self-pay | Admitting: *Deleted

## 2017-12-16 NOTE — Telephone Encounter (Signed)
Left voicemail to let them know that there is a Rx to be picked up at pharmacy.

## 2017-12-16 NOTE — Telephone Encounter (Signed)
Patient's sister called to verify the msg left by Lawson Fiscal

## 2017-12-17 ENCOUNTER — Ambulatory Visit (INDEPENDENT_AMBULATORY_CARE_PROVIDER_SITE_OTHER): Payer: Medicaid Other | Admitting: Internal Medicine

## 2017-12-17 ENCOUNTER — Encounter: Payer: Self-pay | Admitting: Internal Medicine

## 2017-12-17 VITALS — BP 102/64 | HR 79 | Ht 71.5 in | Wt 199.4 lb

## 2017-12-17 DIAGNOSIS — M059 Rheumatoid arthritis with rheumatoid factor, unspecified: Secondary | ICD-10-CM | POA: Diagnosis not present

## 2017-12-17 DIAGNOSIS — R7989 Other specified abnormal findings of blood chemistry: Secondary | ICD-10-CM | POA: Diagnosis not present

## 2017-12-17 DIAGNOSIS — R768 Other specified abnormal immunological findings in serum: Secondary | ICD-10-CM

## 2017-12-17 DIAGNOSIS — J849 Interstitial pulmonary disease, unspecified: Secondary | ICD-10-CM | POA: Diagnosis not present

## 2017-12-17 NOTE — Patient Instructions (Addendum)
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  -We agreed on cellcept/bactrim to prevent progression  - - I will talk to Dr Tresa Flannagan again and get clearance from pharmacist - continue o2 at home at night    Current smoker Try to quit ASAP  Followup In 3 months do Pre-bd spiro and dlco only. No lung volume or bd response. 3 months or sooner if needed
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
## 2017-12-17 NOTE — Progress Notes (Signed)
PFT done today. 

## 2017-12-22 ENCOUNTER — Ambulatory Visit: Payer: Medicaid Other | Admitting: Internal Medicine

## 2017-12-28 ENCOUNTER — Encounter: Payer: Self-pay | Admitting: Cardiology

## 2017-12-28 ENCOUNTER — Ambulatory Visit (INDEPENDENT_AMBULATORY_CARE_PROVIDER_SITE_OTHER): Payer: Medicaid Other | Admitting: Cardiology

## 2017-12-28 VITALS — BP 114/90 | HR 91 | Ht 71.5 in | Wt 209.0 lb

## 2017-12-28 DIAGNOSIS — E785 Hyperlipidemia, unspecified: Secondary | ICD-10-CM

## 2017-12-28 DIAGNOSIS — I5043 Acute on chronic combined systolic (congestive) and diastolic (congestive) heart failure: Secondary | ICD-10-CM

## 2017-12-28 DIAGNOSIS — I2510 Atherosclerotic heart disease of native coronary artery without angina pectoris: Secondary | ICD-10-CM

## 2017-12-28 DIAGNOSIS — I10 Essential (primary) hypertension: Secondary | ICD-10-CM

## 2017-12-28 DIAGNOSIS — I255 Ischemic cardiomyopathy: Secondary | ICD-10-CM | POA: Diagnosis not present

## 2017-12-28 MED ORDER — SACUBITRIL-VALSARTAN 24-26 MG PO TABS: 1.0000 | ORAL_TABLET | Freq: Two times a day (BID) | ORAL | 0 refills | Status: DC

## 2017-12-28 MED ORDER — FUROSEMIDE 20 MG PO TABS: 20.0000 mg | ORAL_TABLET | Freq: Every day | ORAL | 1 refills | Status: DC | PRN

## 2017-12-28 NOTE — Patient Instructions (Addendum)
Medication Instructions:   STOP TAKING LOSARTAN NOW   START TAKING ENTRESTO 24-26 MG BY MOUTH TWICE DAILY--DO NOT START TAKING YOUR 1ST DOSE UNTIL TOMORROW NIGHT ON 12/29/17--FOR WE HAVE TO FLUSH OUT YOUR LOSARTAN IN THAT TIME PERIOD--YOU WILL PROCEED WITH TAKING IT TWICE DAILY ON Thursday 12/30/17.    START TAKING LASIX 20 MG BY MOUTH DAILY AS NEEDED FOR LOWER EXTREMITY SWELLING   Labwork:  IN 2 WEEKS--SAME DAY AS YOU COME TO SEE THE PHARMACIST OR DAYNA DUNN PA-C, TO CHECK A BMET     Follow-Up:  2 WEEKS WITH DAYNA DUNN PA-C OR WITH PHARMACIST--FOR NEW START ENTRESTO--PLEASE HAVE YOUR LAB DONE ON THE SAME DAY AS THIS APPOINMENT       If you need a refill on your cardiac medications before your next appointment, please call your pharmacy.
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
## 2018-01-13 NOTE — Progress Notes (Signed)
Patient ID: [name]                 DOB: [birth year]                      MRN: [id number]     HPI: [name] is a 62 y.o. male referred by Dr. Delton See to pharmacy clinic for heart failure medication optimization. PMH is significant for CAD s/p MI x3 s/p CABG x4, stroke in January 2018 s/p right endarterectomy, tobacco abuse, HTN, PVD, chronic CHF, ILD, and RA. He was previously followed by cardiology at [facility] during which time he was noncompliant with his medications and refused echo and stress test. 2D echo at [facility] 01/2017 revealed LVEF 35-40%, LHC 3 days later revealed LVEF < 30%. At most recent visit, losartan was switched to [facility].  Patient reports tolerating his medications well. He does not check his BP at home. He took his medications today. He has Medicaid now. Diet is poor - patient drinks 6 regular sodas and 1/2 liter of sweet tea each day. He continues to smoke 1/2 PPD. He has tried Chantix which he stated did not work.  Current HF meds: carvedilol 6.25mg  BID, Entresto 24-26mg  BID, spironolactone 25mg  daily, furosemide 20mg  daily BP goal: <130/62mmHg  Family History: Mother - hear murmur, father - suicide, brother - stroke, grandmother - heart attack.  Social History: Smoking 1/2 PPD for 47 years. Denies alcohol and illicit drug use.  Diet: Likes sandwiches, eggs, grits, toast, bacon or sausage a few times a week, hot pockets. 1-2 cups of coffee each day and 1/2 gallon of sweet tea each day. Drinks 5-6 regular sodas each day.  Exercise: Walks around the house, ambulates with a cane due to stroke.  Home BP readings: Does not check  Wt Readings from Last 3 Encounters:  12/28/17 209 lb (94.8 kg)  12/17/17 199 lb 6.4 oz (90.4 kg)  12/15/17 203 lb 1.6 oz (92.1 kg)   BP Readings from Last 3 Encounters:  12/28/17 114/90  12/17/17 102/64  12/15/17 111/76   Pulse Readings from Last 3 Encounters:  12/28/17 91  12/17/17 79  12/15/17 74    Renal function: CrCl cannot  be calculated (Patient's most recent lab result is older than the maximum 21 days allowed.).  Past Medical History:  Diagnosis Date  . AAA (abdominal aortic aneurysm) ([facility])   . Arthritis   . CAD in native artery    a. h/o MI x 3 s/p CABG x 4 (1993 with 12/17/17). b. LHC 01/2017 with severe native disease, 2 grafts occluded.  . Carotid artery disease ([facility])    a. s/p R CEA 11/2016.  03/2017 Chronic combined systolic and diastolic CHF (congestive heart failure) ([facility])   . GERD (gastroesophageal reflux disease)   . ILD (interstitial lung disease) ([facility])   . Lung nodule   . MI (myocardial infarction) ([facility])    x 3   . Normocytic anemia   . NSVT (nonsustained ventricular tachycardia) ([facility])   . Pancreatitis    1/19  . Pneumonia 11/2017  . RA (rheumatoid arthritis) ([facility])   . Stroke ([facility])   . Tobacco abuse     Current Outpatient Medications on File Prior to Visit  Medication Sig Dispense Refill  . acetaminophen (TYLENOL) 500 MG tablet Take 1,000 mg by mouth every 6 (six) hours as needed for mild pain.     2/19 albuterol (ACCUNEB) 0.63 MG/3ML nebulizer solution Take 3 mLs by nebulization  every 6 (six) hours as needed for wheezing.  12  . albuterol (PROVENTIL HFA;VENTOLIN HFA) 108 (90 Base) MCG/ACT inhaler Inhale 2 puffs into the lungs every 6 (six) hours as needed for wheezing or shortness of breath. 1 Inhaler 6  . aspirin 81 MG chewable tablet Chew 1 tablet (81 mg total) by mouth daily. 30 tablet 0  . atorvastatin (LIPITOR) 40 MG tablet Take 1 tablet (40 mg total) by mouth daily at 6 PM. 30 tablet 11  . carvedilol (COREG) 6.25 MG tablet Take 1 tablet (6.25 mg total) by mouth 2 (two) times daily with a meal. 60 tablet 11  . furosemide (LASIX) 20 MG tablet Take 1 tablet (20 mg total) by mouth daily as needed for fluid or edema. 30 tablet 1  . gabapentin (NEURONTIN) 300 MG capsule Take 1 capsule (300 mg total) by mouth every 8 (eight) hours. 90 capsule 4  . HYDROcodone-acetaminophen (NORCO) 7.5-325 MG  tablet Take 1 tablet by mouth 3 (three) times daily as needed for moderate pain. DNF: 11/26/17, 12/26/17 For chronic pain Ok to fill 1 day early if pharmacy closed on fill date 90 tablet 0  . hydroxychloroquine (PLAQUENIL) 200 MG tablet Take 200 mg by mouth 2 (two) times daily.  3  . Menthol-Methyl Salicylate (MUSCLE RUB EX) Apply 1 application topically as needed (for shoulder or knee pain).     . OXYGEN Inhale 2 L into the lungs at bedtime.    . predniSONE (DELTASONE) 5 MG tablet Take 2 tablets (10mg ) twice daily.    . sacubitril-valsartan (ENTRESTO) 24-26 MG Take 1 tablet by mouth 2 (two) times daily. 60 tablet 0  . spironolactone (ALDACTONE) 25 MG tablet Take 1 tablet (25 mg total) by mouth daily. 30 tablet 11   No current facility-administered medications on file prior to visit.     No Known Allergies   Assessment/Plan:  1. Hypertension/HF medication optimization - BP above goal <130/13mmHg and have room to optimize HF medications. Will plan to increase Entresto to 49-51mg  BID and continue carvedilol 6.25mg  BID, spironolactone 25mg  daily, and furosemide 20mg  daily. Checking BMET today. Will recheck BMET in 2 weeks and f/u in clinic for BP check in 1 month.  2. Tobacco abuse - Pt smoking 1/2 PPD. Seems moderately invested in quitting. Discussed NRT and pt will purchase OTC nicotine patches and gum. Will assess progress at next visit.  3. Lifestyle - Dietary indiscretion with pt drinking 6 regular sodas and 1/2 gallon of sweet tea each day. Discussed the need to reduce sugary and caffeinated drinks.   Megan E. Supple, PharmD, CPP, BCACP [facility] [address] [place], [address] Phone: [phone]; Fax: [phone] 01/14/2018 10:59 AM

## 2018-01-14 ENCOUNTER — Ambulatory Visit (INDEPENDENT_AMBULATORY_CARE_PROVIDER_SITE_OTHER): Payer: Medicaid Other | Admitting: Pharmacist

## 2018-01-14 ENCOUNTER — Other Ambulatory Visit: Payer: Medicaid Other

## 2018-01-14 VITALS — BP 138/78 | HR 69

## 2018-01-14 DIAGNOSIS — I5042 Chronic combined systolic (congestive) and diastolic (congestive) heart failure: Secondary | ICD-10-CM | POA: Diagnosis not present

## 2018-01-14 DIAGNOSIS — I5043 Acute on chronic combined systolic (congestive) and diastolic (congestive) heart failure: Secondary | ICD-10-CM

## 2018-01-14 DIAGNOSIS — I10 Essential (primary) hypertension: Secondary | ICD-10-CM | POA: Diagnosis not present

## 2018-01-14 DIAGNOSIS — F17200 Nicotine dependence, unspecified, uncomplicated: Secondary | ICD-10-CM | POA: Diagnosis not present

## 2018-01-14 LAB — BASIC METABOLIC PANEL
BUN/Creatinine Ratio: 11 (ref 10–24)
BUN: 10 mg/dL (ref 8–27)
CO2: 20 mmol/L (ref 20–29)
Calcium: 8.9 mg/dL (ref 8.6–10.2)
Chloride: 105 mmol/L (ref 96–106)
Creatinine, Ser: 0.89 mg/dL (ref 0.76–1.27)
GFR calc Af Amer: 106 mL/min/{1.73_m2} (ref >59)
GFR calc non Af Amer: 92 mL/min/{1.73_m2} (ref >59)
Glucose: 123 mg/dL — ABNORMAL HIGH (ref 65–99)
Potassium: 4.1 mmol/L (ref 3.5–5.2)
Sodium: 139 mmol/L (ref 134–144)

## 2018-01-14 MED ORDER — SACUBITRIL-VALSARTAN 49-51 MG PO TABS: 1.0000 | ORAL_TABLET | Freq: Two times a day (BID) | ORAL | 11 refills | Status: DC

## 2018-01-14 NOTE — Patient Instructions (Addendum)
Increase your Entresto to 49-51mg  twice daily  Continue taking your other medications  Monitor your blood pressure at home  Decrease your intake of sweet tea and regular soda

## 2018-01-17 ENCOUNTER — Telehealth: Payer: Self-pay

## 2018-01-17 NOTE — Telephone Encounter (Signed)
I have done an [place] PA over the phone with [name] at [facility].  We can call back in 24 hours for decision at [phone].  Authorization# [id number] Interaction# [id number]

## 2018-01-18 ENCOUNTER — Ambulatory Visit
Admission: RE | Admit: 2018-01-18 | Discharge: 2018-01-18 | Disposition: A | Discharge status: Home / Self Care | Payer: Medicaid Other | Source: Ambulatory Visit | Attending: Student in an Organized Health Care Education/Training Program | Admitting: Student in an Organized Health Care Education/Training Program

## 2018-01-18 ENCOUNTER — Encounter: Payer: Self-pay | Admitting: Student in an Organized Health Care Education/Training Program

## 2018-01-18 ENCOUNTER — Other Ambulatory Visit: Payer: Self-pay

## 2018-01-18 ENCOUNTER — Ambulatory Visit
Payer: Medicaid Other | Attending: Student in an Organized Health Care Education/Training Program | Admitting: Student in an Organized Health Care Education/Training Program

## 2018-01-18 VITALS — BP 153/97 | HR 95 | Resp 16 | Ht 73.0 in | Wt 206.0 lb

## 2018-01-18 DIAGNOSIS — M542 Cervicalgia: Secondary | ICD-10-CM | POA: Diagnosis not present

## 2018-01-18 DIAGNOSIS — Z7982 Long term (current) use of aspirin: Secondary | ICD-10-CM | POA: Diagnosis not present

## 2018-01-18 DIAGNOSIS — G629 Polyneuropathy, unspecified: Secondary | ICD-10-CM | POA: Diagnosis not present

## 2018-01-18 DIAGNOSIS — Z79899 Other long term (current) drug therapy: Secondary | ICD-10-CM | POA: Diagnosis not present

## 2018-01-18 DIAGNOSIS — E7800 Pure hypercholesterolemia, unspecified: Secondary | ICD-10-CM | POA: Diagnosis not present

## 2018-01-18 DIAGNOSIS — M25562 Pain in left knee: Secondary | ICD-10-CM | POA: Diagnosis not present

## 2018-01-18 DIAGNOSIS — Z951 Presence of aortocoronary bypass graft: Secondary | ICD-10-CM | POA: Insufficient documentation

## 2018-01-18 DIAGNOSIS — I252 Old myocardial infarction: Secondary | ICD-10-CM | POA: Diagnosis not present

## 2018-01-18 DIAGNOSIS — F17210 Nicotine dependence, cigarettes, uncomplicated: Secondary | ICD-10-CM | POA: Insufficient documentation

## 2018-01-18 DIAGNOSIS — M25512 Pain in left shoulder: Secondary | ICD-10-CM | POA: Insufficient documentation

## 2018-01-18 DIAGNOSIS — M25511 Pain in right shoulder: Secondary | ICD-10-CM | POA: Diagnosis present

## 2018-01-18 DIAGNOSIS — Z5181 Encounter for therapeutic drug level monitoring: Secondary | ICD-10-CM | POA: Insufficient documentation

## 2018-01-18 DIAGNOSIS — M170 Bilateral primary osteoarthritis of knee: Secondary | ICD-10-CM | POA: Diagnosis not present

## 2018-01-18 DIAGNOSIS — I5042 Chronic combined systolic (congestive) and diastolic (congestive) heart failure: Secondary | ICD-10-CM | POA: Diagnosis not present

## 2018-01-18 DIAGNOSIS — M25561 Pain in right knee: Secondary | ICD-10-CM

## 2018-01-18 DIAGNOSIS — Z8673 Personal history of transient ischemic attack (TIA), and cerebral infarction without residual deficits: Secondary | ICD-10-CM | POA: Insufficient documentation

## 2018-01-18 DIAGNOSIS — G8929 Other chronic pain: Secondary | ICD-10-CM

## 2018-01-18 DIAGNOSIS — Z8739 Personal history of other diseases of the musculoskeletal system and connective tissue: Secondary | ICD-10-CM | POA: Diagnosis not present

## 2018-01-18 DIAGNOSIS — I110 Hypertensive heart disease with heart failure: Secondary | ICD-10-CM | POA: Diagnosis not present

## 2018-01-18 DIAGNOSIS — M069 Rheumatoid arthritis, unspecified: Secondary | ICD-10-CM | POA: Diagnosis not present

## 2018-01-18 DIAGNOSIS — D649 Anemia, unspecified: Secondary | ICD-10-CM | POA: Diagnosis not present

## 2018-01-18 DIAGNOSIS — K219 Gastro-esophageal reflux disease without esophagitis: Secondary | ICD-10-CM | POA: Insufficient documentation

## 2018-01-18 DIAGNOSIS — I2510 Atherosclerotic heart disease of native coronary artery without angina pectoris: Secondary | ICD-10-CM | POA: Insufficient documentation

## 2018-01-18 DIAGNOSIS — G894 Chronic pain syndrome: Secondary | ICD-10-CM | POA: Insufficient documentation

## 2018-01-18 MED ORDER — HYDROCODONE-ACETAMINOPHEN 7.5-325 MG PO TABS: 1.0000 | ORAL_TABLET | Freq: Three times a day (TID) | ORAL | 0 refills | Status: DC | PRN

## 2018-01-18 MED ORDER — GABAPENTIN 300 MG PO CAPS: 300.0000 mg | ORAL_CAPSULE | Freq: Three times a day (TID) | ORAL | 4 refills | Status: AC

## 2018-01-18 NOTE — Progress Notes (Signed)
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 Care Physician: [name], PA-C Location: [facility] Note by: Gillis Santa, M.D Date: 01/18/2018; Time: 2:20 PM  There are no Patient Instructions on file for this visit.

## 2018-01-18 NOTE — Progress Notes (Signed)
Nursing Pain Medication Assessment:  Safety precautions to be maintained throughout the outpatient stay will include: orient to surroundings, keep bed in low position, maintain call bell within reach at all times, provide assistance with transfer out of bed and ambulation.  Medication Inspection Compliance: Pill count conducted under aseptic conditions, in front of the patient. Neither the pills nor the bottle was removed from the patient's sight at any time. Once count was completed pills were immediately returned to the patient in their original bottle.  Medication: See above Pill/Patch Count: 17 of 90 pills remain Pill/Patch Appearance: Markings consistent with prescribed medication Bottle Appearance: Standard pharmacy container. Clearly labeled. Filled Date: 3 / 4 / 2019 Last Medication intake:  Today

## 2018-01-24 ENCOUNTER — Other Ambulatory Visit: Payer: Medicaid Other | Admitting: *Deleted

## 2018-01-24 DIAGNOSIS — I5043 Acute on chronic combined systolic (congestive) and diastolic (congestive) heart failure: Secondary | ICD-10-CM

## 2018-01-24 DIAGNOSIS — I5042 Chronic combined systolic (congestive) and diastolic (congestive) heart failure: Secondary | ICD-10-CM

## 2018-01-24 LAB — BASIC METABOLIC PANEL
BUN/Creatinine Ratio: 8 — ABNORMAL LOW (ref 10–24)
BUN: 7 mg/dL — ABNORMAL LOW (ref 8–27)
CO2: 23 mmol/L (ref 20–29)
Calcium: 9.2 mg/dL (ref 8.6–10.2)
Chloride: 104 mmol/L (ref 96–106)
Creatinine, Ser: 0.92 mg/dL (ref 0.76–1.27)
GFR calc Af Amer: 103 mL/min/{1.73_m2} (ref >59)
GFR calc non Af Amer: 89 mL/min/{1.73_m2} (ref >59)
Glucose: 104 mg/dL — ABNORMAL HIGH (ref 65–99)
Potassium: 4 mmol/L (ref 3.5–5.2)
Sodium: 142 mmol/L (ref 134–144)

## 2018-01-25 NOTE — Telephone Encounter (Signed)
I called [employer] and was advised by Bria that this PA was approved on 01/17/18 and is approved until 01/12/19.  I did notify the pt's sister Lynden Ang and his pharmacy of [facility].

## 2018-01-25 NOTE — Telephone Encounter (Signed)
**Note De-identified  Obfuscation** -----  **Note De-Identified  Obfuscation** Message from Loa Socks, LPN sent at 0/06/6760  4:31 PM EDT ----- Regarding: ENTRESTO PT NEEDS SAMPLES WHILE HIS PHARMACY IS STILL WORKING ON GETTING THIS APPROVED.  PT UNKNOWN OF THE STATUS OF THIS MED BEING APPROVED THROUGH THE PHARMACY.  HE'S ON 49/51.  HE WILL RUN OUT OF SAMPLES X 1 WEEK. CAN YOU ASSIST WITH THIS?  THANKS,  IVY

## 2018-02-05 ENCOUNTER — Other Ambulatory Visit: Payer: Self-pay | Admitting: Cardiology

## 2018-02-05 DIAGNOSIS — I5043 Acute on chronic combined systolic (congestive) and diastolic (congestive) heart failure: Secondary | ICD-10-CM

## 2018-02-07 ENCOUNTER — Ambulatory Visit: Payer: Medicaid Other | Admitting: Internal Medicine

## 2018-02-07 ENCOUNTER — Encounter: Payer: Self-pay | Admitting: Internal Medicine

## 2018-02-07 ENCOUNTER — Telehealth: Payer: Self-pay | Admitting: Internal Medicine

## 2018-02-07 VITALS — BP 118/80 | HR 91 | Temp 97.6°F | Ht 73.0 in | Wt 207.2 lb

## 2018-02-07 DIAGNOSIS — F17210 Nicotine dependence, cigarettes, uncomplicated: Secondary | ICD-10-CM | POA: Diagnosis not present

## 2018-02-07 DIAGNOSIS — F17200 Nicotine dependence, unspecified, uncomplicated: Secondary | ICD-10-CM

## 2018-02-07 DIAGNOSIS — R059 Cough, unspecified: Secondary | ICD-10-CM

## 2018-02-07 DIAGNOSIS — R05 Cough: Secondary | ICD-10-CM | POA: Diagnosis not present

## 2018-02-07 MED ORDER — DM-GUAIFENESIN ER 30-600 MG PO TB12: 1.0000 | ORAL_TABLET | Freq: Two times a day (BID) | ORAL | 11 refills | Status: DC | PRN

## 2018-02-07 MED ORDER — AZITHROMYCIN 250 MG PO TABS: ORAL_TABLET | ORAL | 0 refills | Status: DC

## 2018-02-07 NOTE — Progress Notes (Signed)
Established Abdominal Aortic Aneurysm   History of Present Illness   [name] is a 62 y.o. (04-14-1956) male who presents with chief complaint: follow up for AAA.  Previous studies demonstrate an AAA, measuring 3.9 cm x 4.4 cm.  The patient does not have back or abdominal pain.  The patient is a smoker.  Additionally, patient had R CEA done at [facility].  The patient has had no CVA or TIA sx.  The patient's PMH, PSH, SH, and FamHx were reviewed on 02/09/17 are unchanged from 08/09/17.  Current Outpatient Medications  Medication Sig Dispense Refill  . acetaminophen (TYLENOL) 500 MG tablet Take 1,000 mg by mouth every 6 (six) hours as needed for mild pain.     [name] albuterol (ACCUNEB) 0.63 MG/3ML nebulizer solution Take 3 mLs by nebulization every 6 (six) hours as needed for wheezing.  12  . albuterol (PROVENTIL HFA;VENTOLIN HFA) 108 (90 Base) MCG/ACT inhaler Inhale 2 puffs into the lungs every 6 (six) hours as needed for wheezing or shortness of breath. 1 Inhaler 6  . aspirin 81 MG chewable tablet Chew 1 tablet (81 mg total) by mouth daily. 30 tablet 0  . atorvastatin (LIPITOR) 40 MG tablet Take 1 tablet (40 mg total) by mouth daily at 6 PM. 30 tablet 11  . azithromycin (ZITHROMAX) 250 MG tablet Take 2 on day one then 1 daily x 4 days 6 tablet 0  . carvedilol (COREG) 6.25 MG tablet Take 1 tablet (6.25 mg total) by mouth 2 (two) times daily with a meal. 60 tablet 11  . dextromethorphan-guaiFENesin (MUCINEX DM) 30-600 MG 12hr tablet Take 1 tablet by mouth 2 (two) times daily as needed for cough. 60 tablet 11  . furosemide (LASIX) 20 MG tablet Take 1 tablet (20 mg total) by mouth daily as needed for fluid or edema. 30 tablet 1  . gabapentin (NEURONTIN) 300 MG capsule Take 1 capsule (300 mg total) by mouth every 8 (eight) hours. 90 capsule 4  . HYDROcodone-acetaminophen (NORCO) 7.5-325 MG tablet Take 1 tablet by mouth 3 (three) times daily as needed for moderate pain. DNF: 02/02/18,  03/03/18 For chronic pain Ok to fill 1 day early if pharmacy closed on fill date 90 tablet 0  . hydroxychloroquine (PLAQUENIL) 200 MG tablet Take 200 mg by mouth 2 (two) times daily.  3  . Menthol-Methyl Salicylate (MUSCLE RUB EX) Apply 1 application topically as needed (for shoulder or knee pain).     . OXYGEN Inhale 2 L into the lungs at bedtime.    . predniSONE (DELTASONE) 5 MG tablet Take 2 tablets (10mg ) twice daily.    . sacubitril-valsartan (ENTRESTO) 49-51 MG Take 1 tablet by mouth 2 (two) times daily. 60 tablet 11  . spironolactone (ALDACTONE) 25 MG tablet Take 1 tablet (25 mg total) by mouth daily. 30 tablet 11   No current facility-administered medications for this visit.    On ROS today: no intermittent claudication , no embolic sx   Physical Examination   Vitals:   02/09/18 0924 02/09/18 0931  BP: 125/82 119/73  Pulse: 78 75  Resp: 20   Temp: (!) 97.5 F (36.4 C)   TempSrc: Oral   SpO2: 96%   Weight: 205 lb (93 kg)   Height: 6\' 1"  (1.854 m)    Body mass index is 27.05 kg/m.  General Alert, O x 3, WD, NAD  Pulmonary Sym exp, good B air movt, CTA B  Cardiac RRR, Nl S1, S2, no  Murmurs, No rubs, No S3,S4  Vascular Vessel Right Left  Radial Palpable Palpable  Brachial Palpable Palpable  Carotid Palpable, No Bruit Palpable, No Bruit  Aorta Not palpable N/A  Femoral Palpable Palpable  Popliteal Not palpable Not palpable  PT Faintly palpable Faintly palpable  DP Faintly palpable Faintly palpable    Gastro- intestinal soft, non-distended, non-tender to palpation, No guarding or rebound, no HSM, no masses, no CVAT B, No palpable prominent aortic pulse,    Musculo- skeletal M/S 5/5 throughout  , Extremities without ischemic changes  , No edema present, No visible varicosities , No Lipodermatosclerosis present  Neurologic Pain and light touch intact in extremities , Motor exam as listed above    Non-invasive Vascular Imaging     B Carotid Duplex (02/09/2018):    R ICA stenosis:  1-39%  R VA: patent and antegrade  L ICA stenosis:  60-79%  L VA: patent and antegrade  AAA Duplex (02/09/2018)  Current size:  4.3 cm x 4.2 cm   Previous size: 3.9 cm x 4.4 cm    Medical Decision Making   [name] is a 62 y.o. (04-Oct-1956) male who presents with: asymptomatic AAA with stable size, s/p R CEA, asx L ICA stenosis 60-79%   Based on this patient's exam and diagnostic studies, he needs annual AAA duplex, q6 month B carotid duplex.  The threshold for repair is AAA size > 5.5 cm, growth > 1 cm/yr, and symptomatic status.  I emphasized the importance of maximal medical management including strict control of blood pressure, blood glucose, and lipid levels, antiplatelet agents, obtaining regular exercise, and cessation of smoking.    Thank you for allowing [place] to participate in this patient's care.   Leonides Sake, MD, FACS [facility] Office: [phone] Pager: [phone]

## 2018-02-07 NOTE — Patient Instructions (Addendum)
Double the prednisone you turn the corner  Zpak  Cough > mucinex dm up to 1200 mg every 12 hours and supplement as needed with hydrocodone  For breathing > ok to use neb up to every 4 hours as needed    Try prilosec otc 20mg   Take 30-60 min before first meal of the day and Pepcid ac (famotidine) 20 mg one @  bedtime until cough is completely gone for at least a week without the need for cough suppression   GERD (REFLUX)  is an extremely common cause of respiratory symptoms just like yours , many times with no obvious heartburn at all.    It can be treated with medication, but also with lifestyle changes including elevation of the head of your bed (ideally with 6 inch  bed blocks),  Smoking cessation, avoidance of late meals, excessive alcohol, and avoid fatty foods, chocolate, peppermint, colas, red wine, and acidic juices such as orange juice.  NO MINT OR MENTHOL PRODUCTS SO NO COUGH DROPS  USE SUGARLESS CANDY INSTEAD (Jolley ranchers or Stover's or Life Savers) or even ice chips will also do - the key is to swallow to prevent all throat clearing. NO OIL BASED VITAMINS - use powdered substitutes.      Please remember to go to the  x-ray department downstairs in the basement  for your tests - we will call you with the results when they are available.

## 2018-02-07 NOTE — Progress Notes (Signed)
Subjective:     Patient ID: [name], male   DOB: [birth year], 62 y.o.   MRN: [id number]     Results for KODEE, RAVERT (MRN [id number]) as of 04/01/2017    Ref. Range 02/25/2017 07:33  ANA Ab, IFA Unknown Negative  ANCA Proteinase 3 Latest Ref Range: 0.0 - 3.5 U/mL <3.5  Anti JO-1 Latest Ref Range: 0.0 - 0.9 AI <0.2  CCP Antibodies IgG/IgA Latest Ref Range: 0 - 19 units 36 (H)  DNA-Histone Latest Ref Range: 0.0 - 0.9 Units 0.8  Myeloperoxidase Abs Latest Ref Range: 0.0 - 9.0 U/mL <9.0  RA Latex Turbid. Latest Ref Range: 0.0 - 13.9 IU/mL >650.0 (H)  Cytoplasmic (C-ANCA) Latest Ref Range: Neg:<1:20 titer <1:20  P-ANCA Latest Ref Range: Neg:<1:20 titer <1:20  Atypical P-ANCA titer Latest Ref Range: Neg:<1:20 titer <1:20  Scleroderma (Scl-70) (ENA) Antibody, IgG Latest Ref Range: 0.0 - 0.9 AI <0.2  Results for OSIRIS, ODRISCOLL (MRN [id number]) as of 04/01/2017 10:00  Ref. Range 02/25/2017 07:33  Sed Rate Latest Ref Range: 0 - 16 mm/hr 115 (H)          02/07/2018 acute extended ov/Eulogia Dismore re: active smoker with RA/ ILD  Chief Complaint  Patient presents with  . Acute Visit    increased SOB and wheezing x 3 days, sore throat and difficulty swallowing.   arthritis much worse since a month prior to OV   Then sob much worse x 3 days/ wheeze/ cough/ sore throat / did neb 7 am prior to OV which is nl routine = twice in 24 hours but now up to 3 x daily  No fever now/ slt yellow mucus  No obvious patterns in day to day or daytime variability or assoc   mucus plugs or hemoptysis or cp or chest tightness, subjective wheeze or overt sinus or hb symptoms. No unusual exposure hx or h/o childhood pna/ asthma or knowledge of premature birth.  Sleeping ok flat without nocturnal  or early am exacerbation  of respiratory  c/o's or need for noct saba. Also denies any obvious fluctuation of symptoms with weather or environmental changes or other aggravating or alleviating factors except as outlined above    Current Allergies, Complete Past Medical History, Past Surgical History, Family History, and Social History were reviewed in [facility] record.  ROS  The following are not active complaints unless bolded Hoarseness, sore throat, dysphagia, dental problems, itching, sneezing,  nasal congestion or discharge of excess mucus or purulent secretions, ear ache,   fever, chills, sweats, unintended wt loss or wt gain, classically pleuritic or exertional cp,  orthopnea pnd or leg swelling, presyncope, palpitations, abdominal pain, anorexia, nausea, vomiting, diarrhea  or change in bowel habits or change in bladder habits, change in stools or change in urine, dysuria, hematuria,  rash, arthralgias, visual complaints, headache, numbness, weakness or ataxia or problems with walking or coordination,  change in mood/affect or memory.        Current Meds  Medication Sig  . acetaminophen (TYLENOL) 500 MG tablet Take 1,000 mg by mouth every 6 (six) hours as needed for mild pain.   [name] albuterol (ACCUNEB) 0.63 MG/3ML nebulizer solution Take 3 mLs by nebulization every 6 (six) hours as needed for wheezing.  [name] albuterol (PROVENTIL HFA;VENTOLIN HFA) 108 (90 Base) MCG/ACT inhaler Inhale 2 puffs into the lungs every 6 (six) hours as needed for wheezing or shortness of breath.  [name] aspirin 81 MG chewable tablet Chew 1 tablet (  81 mg total) by mouth daily.  [name] atorvastatin (LIPITOR) 40 MG tablet Take 1 tablet (40 mg total) by mouth daily at 6 PM.  . gabapentin (NEURONTIN) 300 MG capsule Take 1 capsule (300 mg total) by mouth every 8 (eight) hours.  [name] HYDROcodone-acetaminophen (NORCO) 7.5-325 MG tablet Take 1 tablet by mouth 3 (three) times daily as needed for moderate pain. DNF: 02/02/18, 03/03/18 For chronic pain Ok to fill 1 day early if pharmacy closed on fill date  . hydroxychloroquine (PLAQUENIL) 200 MG tablet Take 200 mg by mouth 2 (two) times daily.  . Menthol-Methyl Salicylate (MUSCLE RUB EX) Apply  1 application topically as needed (for shoulder or knee pain).   . OXYGEN Inhale 2 L into the lungs at bedtime.  . predniSONE (DELTASONE) 5 MG tablet Take 2 tablets (10mg ) twice daily.  . sacubitril-valsartan (ENTRESTO) 49-51 MG Take 1 tablet by mouth 2 (two) times daily.  spironolactone (ALDACTONE) 25 MG tablet Take 1 tablet (25 mg total) by mouth daily.  . [DISCONTINUED] furosemide (LASIX) 20 MG tablet Take 1 tablet (20 mg total) by mouth daily as needed for fluid or edema.                   Objective:   Physical Exam   amb wm wearing face mask   Wt Readings from Last 3 Encounters:  02/07/18 207 lb 3.2 oz (94 kg)  01/18/18 206 lb (93.4 kg)  12/28/17 209 lb (94.8 kg)     Vital signs reviewed - Note on arrival 02 sats  96% on RA      Tense abd / insp and exp rhonchi   HEENT: nl dentition, turbinates bilaterally, and oropharynx. Nl external ear canals without cough reflex   NECK :  without JVD/Nodes/TM/ nl carotid upstrokes bilaterally   LUNGS: no acc muscle use,  Nl contour chest with insp and exp rhonchi bilaterally s bronchial changes    CV:  RRR  no s3 or murmur or increase in P2, and no edema   ABD:  Tensely obese/  nontender with nl inspiratory excursion in the supine position. No bruits or organomegaly appreciated, bowel sounds nl  MS:  Nl gait/ ext warm without deformities, calf tenderness, cyanosis or clubbing No obvious joint restrictions   SKIN: warm and dry without lesions    NEURO:  alert, approp, nl sensorium with  no motor or cerebellar deficits apparent.      CXR PA and Lateral:   02/07/2018 :    I personally reviewed images and agree with radiology impression as follows:   Did not go for cxr as rec       Assessment:

## 2018-02-07 NOTE — Telephone Encounter (Signed)
Called and spoke to pt's sister, Olegario Messier. She states pt is c/o increase in SOB, wheezing, cough, fatigue x several days. Appt made with MW today at 1145. Pt verbalized understanding and denied any further questions or concerns at this time.

## 2018-02-08 ENCOUNTER — Ambulatory Visit: Payer: Medicaid Other | Admitting: Pharmacist

## 2018-02-08 ENCOUNTER — Encounter: Payer: Self-pay | Admitting: Internal Medicine

## 2018-02-08 VITALS — BP 124/78 | HR 89

## 2018-02-08 DIAGNOSIS — I10 Essential (primary) hypertension: Secondary | ICD-10-CM | POA: Diagnosis not present

## 2018-02-08 MED ORDER — CARVEDILOL 12.5 MG PO TABS: 12.5000 mg | ORAL_TABLET | Freq: Two times a day (BID) | ORAL | 11 refills | Status: DC

## 2018-02-08 NOTE — Assessment & Plan Note (Signed)
Acute uri in setting of ILD/ active RA   rec zpak/ mucinex dm and supplement with percocet  Also, Of the three most common causes of  Sub-acute or recurrent or chronic cough, only one (GERD)  can actually contribute to/ trigger  the other two (asthma and post nasal drip syndrome)  and perpetuate the cylce of cough.  While not intuitively obvious, many patients with chronic low grade reflux do not cough until there is a primary insult that disturbs the protective epithelial barrier and exposes sensitive nerve endings.   This is typically viral as likely the case here but can be direct physical injury such as with an endotracheal tube.   The point is that once this occurs, it is difficult to eliminate the cycle  using anything but a maximally effective acid suppression regimen at least in the short run, accompanied by an appropriate diet to address non acid GERD and control / eliminate the cough itself for at least 3 days with narcotic containing cough meds/ reviewed   I had an extended discussion with the patient reviewing all relevant studies completed to date and  lasting 15 to 20 minutes of a 25 minute acute office visit with pt new to me   Each maintenance medication was reviewed in detail including most importantly the difference between maintenance and prns and under what circumstances the prns are to be triggered using an action plan format that is not reflected in the computer generated alphabetically organized AVS.    Please see AVS for specific instructions unique to this visit that I personally wrote and verbalized to the the pt in detail and then reviewed with pt  by my nurse highlighting any  changes in therapy recommended at today's visit to their plan of care.

## 2018-02-08 NOTE — Patient Instructions (Addendum)
Blood work today - BMET  INCREASE carvedilol to 12.5mg  TWICE daily (you may take two tablets of your current supply TWICE daily until you run out then pick up higher strength from pharmacy and take 1 tablet (12.5mg ) TWICE daily)  CONTINUE all other medications as prescribed.  Try to monitor your pressures and bring a log with your to your next visit.   Follow up in clinic in 3-4 weeks.

## 2018-02-08 NOTE — Progress Notes (Signed)
Patient ID: [name]                 DOB: Nov 03, 1955                      MRN: [id number]     HPI: [name] is a 62 y.o. male patient of Dr. Delton See who presents today for hypertension follow up. PMH significant for CAD s/p MI x3 s/p CABG x4, stroke in January 2018 s/p right endarterectomy, tobacco abuse, HTN, PVD, chronic CHF, ILD, and RA. He was previously followed by cardiology at [facility] during which time he was noncompliant with his medications and refused echo and stress test. 2D echo at [facility] 01/2017 revealed LVEF 35-40%, LHC 3 days later revealed LVEF < 30%. At his most recent OV in HTN clinic his Sherryll Burger was increased to 49/51mg  BID. Smoking cessation and lifestyle modifications were also discussed at last visit.   He presents today for follow up. He did take a dose of furosemide 20mg  this morning for swelling in his legs. He reports he takes furosemide about 3-4 times per week. He denies chest pain, dizziness, SOB, and headaches.    Current HF meds: carvedilol 6.25mg  BID, Entresto 24-26mg  BID, spironolactone 25mg  daily, furosemide 20mg  daily BP goal: <130/50mmHg  Family History: Mother - hear murmur, father - suicide, brother - stroke, grandmother - heart attack.  Social History: Smoking 1/2 PPD for 47 years. Denies alcohol and illicit drug use.  Diet: Likes sandwiches, eggs, grits, toast, bacon or sausage a few times a week, hot pockets. 1-2 cups of coffee each day and 1/2 gallon of sweet tea each day. Drinks 5-6 regular sodas each day.  Exercise: Walks around the house, ambulates with a cane due to stroke.  Home BP readings: Does not check   Wt Readings from Last 3 Encounters:  02/09/18 205 lb (93 kg)  02/07/18 207 lb 3.2 oz (94 kg)  01/18/18 206 lb (93.4 kg)   BP Readings from Last 3 Encounters:  02/09/18 119/73  02/08/18 124/78  02/07/18 118/80   Pulse Readings from Last 3 Encounters:  02/09/18 75  02/08/18 89  02/07/18 91    Renal  function: Estimated Creatinine Clearance: 91.1 mL/min (by C-G formula based on SCr of 0.95 mg/dL).  Past Medical History:  Diagnosis Date  . AAA (abdominal aortic aneurysm) (HCC)   . Arthritis   . CAD in native artery    a. h/o MI x 3 s/p CABG x 4 (1993 with 04/10/18). b. LHC 01/2017 with severe native disease, 2 grafts occluded.  . Carotid artery disease (HCC)    a. s/p R CEA 11/2016.  Tyrone Sage Chronic combined systolic and diastolic CHF (congestive heart failure) (HCC)   . GERD (gastroesophageal reflux disease)   . ILD (interstitial lung disease) (HCC)   . Lung nodule   . MI (myocardial infarction) (HCC)    x 3   . Normocytic anemia   . NSVT (nonsustained ventricular tachycardia) (HCC)   . Pancreatitis    1/19  . Pneumonia 11/2017  . RA (rheumatoid arthritis) (HCC)   . Stroke (HCC)   . Tobacco abuse     Current Outpatient Medications on File Prior to Visit  Medication Sig Dispense Refill  . acetaminophen (TYLENOL) 500 MG tablet Take 1,000 mg by mouth every 6 (six) hours as needed for mild pain.     [name] albuterol (ACCUNEB) 0.63 MG/3ML nebulizer solution Take 3 mLs by nebulization every 6 (six)  hours as needed for wheezing.  12  . albuterol (PROVENTIL HFA;VENTOLIN HFA) 108 (90 Base) MCG/ACT inhaler Inhale 2 puffs into the lungs every 6 (six) hours as needed for wheezing or shortness of breath. 1 Inhaler 6  . aspirin 81 MG chewable tablet Chew 1 tablet (81 mg total) by mouth daily. 30 tablet 0  . atorvastatin (LIPITOR) 40 MG tablet Take 1 tablet (40 mg total) by mouth daily at 6 PM. 30 tablet 11  . azithromycin (ZITHROMAX) 250 MG tablet Take 2 on day one then 1 daily x 4 days 6 tablet 0  . furosemide (LASIX) 20 MG tablet Take 1 tablet (20 mg total) by mouth daily as needed for fluid or edema. 90 tablet 3  . gabapentin (NEURONTIN) 300 MG capsule Take 1 capsule (300 mg total) by mouth every 8 (eight) hours. 90 capsule 4  . HYDROcodone-acetaminophen (NORCO) 7.5-325 MG tablet Take 1 tablet by  mouth 3 (three) times daily as needed for moderate pain. DNF: 02/02/18, 03/03/18 For chronic pain Ok to fill 1 day early if pharmacy closed on fill date 90 tablet 0  . hydroxychloroquine (PLAQUENIL) 200 MG tablet Take 200 mg by mouth 2 (two) times daily.  3  . Menthol-Methyl Salicylate (MUSCLE RUB EX) Apply 1 application topically as needed (for shoulder or knee pain).     . OXYGEN Inhale 2 L into the lungs at bedtime.    . predniSONE (DELTASONE) 5 MG tablet Take 2 tablets (10mg ) twice daily.    . sacubitril-valsartan (ENTRESTO) 49-51 MG Take 1 tablet by mouth 2 (two) times daily. 60 tablet 11  . spironolactone (ALDACTONE) 25 MG tablet Take 1 tablet (25 mg total) by mouth daily. 30 tablet 11  . dextromethorphan-guaiFENesin (MUCINEX DM) 30-600 MG 12hr tablet Take 1 tablet by mouth 2 (two) times daily as needed for cough. (Patient not taking: Reported on 02/09/2018) 60 tablet 11   No current facility-administered medications on file prior to visit.     No Known Allergies  Blood pressure 124/78, pulse 89.   Assessment/Plan: Hypertension/medication optimization: BMET today. Will optimize betablocker therapy today to target HR. Will increase carvedilol to 12.5mg  BID. Will continue Entresto at current dose as pt just picked up new RX and not sure if pressures will tolerate increase in both carvedilol and Entresto. Have asked he monitor and bring log or cuff with him to next visit if able. Follow up for additional optimization in 3-4 weeks.   Thank you, 04/11/2018. Freddie Apley, PharmD  [facility]  02/09/2018 2:16 PM   ADDENDUM: BMET WNL.

## 2018-02-08 NOTE — Assessment & Plan Note (Signed)
>   3 min discussion  I emphasized that although we never turn away smokers from the pulmonary clinic, we do ask that they understand that the recommendations that we make  won't work nearly as well in the presence of continued cigarette exposure.    

## 2018-02-09 ENCOUNTER — Other Ambulatory Visit: Payer: Self-pay

## 2018-02-09 ENCOUNTER — Ambulatory Visit (INDEPENDENT_AMBULATORY_CARE_PROVIDER_SITE_OTHER): Payer: Medicaid Other | Admitting: Vascular Surgery

## 2018-02-09 ENCOUNTER — Ambulatory Visit (INDEPENDENT_AMBULATORY_CARE_PROVIDER_SITE_OTHER)
Admission: RE | Admit: 2018-02-09 | Discharge: 2018-02-09 | Disposition: A | Discharge status: Home / Self Care | Payer: Medicaid Other | Source: Ambulatory Visit | Attending: Vascular Surgery | Admitting: Vascular Surgery

## 2018-02-09 ENCOUNTER — Ambulatory Visit (HOSPITAL_COMMUNITY)
Admission: RE | Admit: 2018-02-09 | Discharge: 2018-02-09 | Disposition: A | Discharge status: Home / Self Care | Payer: Medicaid Other | Source: Ambulatory Visit | Attending: Vascular Surgery | Admitting: Vascular Surgery

## 2018-02-09 ENCOUNTER — Encounter: Payer: Self-pay | Admitting: Pharmacist

## 2018-02-09 ENCOUNTER — Encounter: Payer: Self-pay | Admitting: Vascular Surgery

## 2018-02-09 VITALS — BP 119/73 | HR 75 | Temp 97.5°F | Resp 20 | Ht 73.0 in | Wt 205.0 lb

## 2018-02-09 DIAGNOSIS — Z6827 Body mass index (BMI) 27.0-27.9, adult: Secondary | ICD-10-CM | POA: Diagnosis not present

## 2018-02-09 DIAGNOSIS — I714 Abdominal aortic aneurysm, without rupture, unspecified: Secondary | ICD-10-CM

## 2018-02-09 DIAGNOSIS — I6523 Occlusion and stenosis of bilateral carotid arteries: Secondary | ICD-10-CM | POA: Diagnosis present

## 2018-02-09 DIAGNOSIS — F17200 Nicotine dependence, unspecified, uncomplicated: Secondary | ICD-10-CM | POA: Diagnosis not present

## 2018-02-09 DIAGNOSIS — E669 Obesity, unspecified: Secondary | ICD-10-CM | POA: Diagnosis not present

## 2018-02-09 LAB — BASIC METABOLIC PANEL
BUN/Creatinine Ratio: 13 (ref 10–24)
BUN: 12 mg/dL (ref 8–27)
CO2: 23 mmol/L (ref 20–29)
Calcium: 9 mg/dL (ref 8.6–10.2)
Chloride: 100 mmol/L (ref 96–106)
Creatinine, Ser: 0.95 mg/dL (ref 0.76–1.27)
GFR calc Af Amer: 99 mL/min/{1.73_m2} (ref >59)
GFR calc non Af Amer: 85 mL/min/{1.73_m2} (ref >59)
Glucose: 125 mg/dL — ABNORMAL HIGH (ref 65–99)
Potassium: 4 mmol/L (ref 3.5–5.2)
Sodium: 142 mmol/L (ref 134–144)

## 2018-02-15 ENCOUNTER — Telehealth: Payer: Self-pay | Admitting: Student in an Organized Health Care Education/Training Program

## 2018-02-15 DIAGNOSIS — M19011 Primary osteoarthritis, right shoulder: Secondary | ICD-10-CM

## 2018-02-15 DIAGNOSIS — M19012 Primary osteoarthritis, left shoulder: Secondary | ICD-10-CM

## 2018-02-15 NOTE — Telephone Encounter (Signed)
Patient wants to get injections into his shoulders does he need  To come in for eval before a procedure?

## 2018-02-23 ENCOUNTER — Encounter: Payer: Self-pay | Admitting: Student in an Organized Health Care Education/Training Program

## 2018-02-23 ENCOUNTER — Ambulatory Visit
Payer: Medicaid Other | Attending: Student in an Organized Health Care Education/Training Program | Admitting: Student in an Organized Health Care Education/Training Program

## 2018-02-23 VITALS — BP 117/86 | HR 78 | Temp 97.8°F | Resp 16 | Ht 73.0 in | Wt 207.0 lb

## 2018-02-23 DIAGNOSIS — Z79891 Long term (current) use of opiate analgesic: Secondary | ICD-10-CM | POA: Diagnosis not present

## 2018-02-23 DIAGNOSIS — M25511 Pain in right shoulder: Secondary | ICD-10-CM | POA: Diagnosis present

## 2018-02-23 DIAGNOSIS — M19012 Primary osteoarthritis, left shoulder: Secondary | ICD-10-CM | POA: Insufficient documentation

## 2018-02-23 DIAGNOSIS — Z79899 Other long term (current) drug therapy: Secondary | ICD-10-CM | POA: Insufficient documentation

## 2018-02-23 DIAGNOSIS — M19011 Primary osteoarthritis, right shoulder: Secondary | ICD-10-CM | POA: Insufficient documentation

## 2018-02-23 DIAGNOSIS — G8929 Other chronic pain: Secondary | ICD-10-CM | POA: Insufficient documentation

## 2018-02-23 DIAGNOSIS — Z7982 Long term (current) use of aspirin: Secondary | ICD-10-CM | POA: Insufficient documentation

## 2018-02-23 DIAGNOSIS — M25512 Pain in left shoulder: Secondary | ICD-10-CM | POA: Insufficient documentation

## 2018-02-23 MED ORDER — LIDOCAINE HCL (PF) 1 % IJ SOLN
INTRAMUSCULAR | Status: AC
  Filled 2018-02-23: qty 5

## 2018-02-23 MED ORDER — DEXAMETHASONE SODIUM PHOSPHATE 10 MG/ML IJ SOLN
10.0000 mg | Freq: Once | INTRAMUSCULAR | Status: AC
Start: 2018-02-23 — End: 2018-02-23
  Administered 2018-02-23: 10 mg

## 2018-02-23 MED ORDER — ROPIVACAINE HCL 2 MG/ML IJ SOLN
INTRAMUSCULAR | Status: AC
  Filled 2018-02-23: qty 10

## 2018-02-23 MED ORDER — LIDOCAINE HCL 1 % IJ SOLN
10.0000 mL | Freq: Once | INTRAMUSCULAR | Status: AC
  Administered 2018-02-23: 5 mL
  Filled 2018-02-23: qty 10

## 2018-02-23 MED ORDER — ROPIVACAINE HCL 2 MG/ML IJ SOLN
10.0000 mL | Freq: Once | INTRAMUSCULAR | Status: AC
  Administered 2018-02-23: 10 mL

## 2018-02-23 MED ORDER — DEXAMETHASONE SODIUM PHOSPHATE 10 MG/ML IJ SOLN
INTRAMUSCULAR | Status: AC
  Filled 2018-02-23: qty 1

## 2018-02-23 NOTE — Progress Notes (Signed)
Safety precautions to be maintained throughout the outpatient stay will include: orient to surroundings, keep bed in low position, maintain call bell within reach at all times, provide assistance with transfer out of bed and ambulation.  

## 2018-02-23 NOTE — Progress Notes (Signed)
Patient's Name: [name]  MRN: [id number]  Referring Provider: Denny Levy  DOB: 1956-06-11  PCP: [name], Cordelia Poche  DOS: 02/23/2018  Note by: Edward Jolly, MD  Service setting: Ambulatory outpatient  Specialty: Interventional Pain Management  Patient type: Established  Location: [facility] (AMB) Pain Management Facility  Visit type: Interventional Procedure   Primary Reason for Visit: Interventional Pain Management Treatment. CC: Shoulder Pain (R>L)  Procedure:  Anesthesia, Analgesia, Anxiolysis:  Type: Diagnostic Glenohumeral Joint (shoulder) Injection #1  CPT: 20610      Primary Purpose: Diagnostic Region: Superior Shoulder Area Level:  Shoulder Target Area: Glenohumeral Joint (shoulder) Approach: Posterior approach. Laterality: Bilateral Position: Sitting  Type: Local Anesthesia Indication(s): Analgesia         Route: Infiltration ([place]/IM) IV Access: Declined Sedation: Declined  Local Anesthetic: Lidocaine 1-2%   Indications: 1. Primary osteoarthritis of both shoulders   2. Osteoarthritis of right shoulder, unspecified osteoarthritis type   3. Chronic pain of both shoulders    Pain Score: Pre-procedure: 8 /10 Post-procedure: 0-No pain/10  Pre-op Assessment:  [name] is a 62 y.o. (year old), male patient, seen today for interventional treatment. He  has a past surgical history that includes Cardiac catheterization; LEFT HEART CATH AND CORS/GRAFTS ANGIOGRAPHY (N/A, 03/01/2017); Carotid endarterectomy (Left); Heart Bypass (10/1992); and Throat Biopsy. [name] has a current medication list which includes the following prescription(s): acetaminophen, albuterol, albuterol, aspirin, atorvastatin, carvedilol, furosemide, gabapentin, hydrocodone-acetaminophen, hydroxychloroquine, menthol-methyl salicylate, oxygen-helium, prednisone, sacubitril-valsartan, spironolactone, azithromycin, dextromethorphan-guaifenesin, lisinopril, and metoprolol tartrate. His primarily  concern today is the Shoulder Pain (R>L)  Initial Vital Signs:  Pulse/HCG Rate: 80  Temp: 97.8 F (36.6 C) Resp: 18 BP: (!) 119/91 SpO2: 96 %  BMI: Estimated body mass index is 27.31 kg/m as calculated from the following:   Height as of this encounter: 6\' 1"  (1.854 m).   Weight as of this encounter: 207 lb (93.9 kg).  Risk Assessment: Allergies: Reviewed. He has No Known Allergies.  Allergy Precautions: None required Coagulopathies: Reviewed. None identified.  Blood-thinner therapy: None at this time Active Infection(s): Reviewed. None identified. [name] is afebrile  Site Confirmation: [name] was asked to confirm the procedure and laterality before marking the site Procedure checklist: Completed Consent: Before the procedure and under the influence of no sedative(s), amnesic(s), or anxiolytics, the patient was informed of the treatment options, risks and possible complications. To fulfill our ethical and legal obligations, as recommended by the American Medical Association's Code of Ethics, I have informed the patient of my clinical impression; the nature and purpose of the treatment or procedure; the risks, benefits, and possible complications of the intervention; the alternatives, including doing nothing; the risk(s) and benefit(s) of the alternative treatment(s) or procedure(s); and the risk(s) and benefit(s) of doing nothing. The patient was provided information about the general risks and possible complications associated with the procedure. These may include, but are not limited to: failure to achieve desired goals, infection, bleeding, organ or nerve damage, allergic reactions, paralysis, and death. In addition, the patient was informed of those risks and complications associated to the procedure, such as failure to decrease pain; infection; bleeding; organ or nerve damage with subsequent damage to sensory, motor, and/or autonomic systems, resulting in permanent pain, numbness,  and/or weakness of one or several areas of the body; allergic reactions; (i.e.: anaphylactic reaction); and/or death. Furthermore, the patient was informed of those risks and complications associated with the medications. These include, but are not limited to: allergic  reactions (i.e.: anaphylactic or anaphylactoid reaction(s)); adrenal axis suppression; blood sugar elevation that in diabetics may result in ketoacidosis or comma; water retention that in patients with history of congestive heart failure may result in shortness of breath, pulmonary edema, and decompensation with resultant heart failure; weight gain; swelling or edema; medication-induced neural toxicity; particulate matter embolism and blood vessel occlusion with resultant organ, and/or nervous system infarction; and/or aseptic necrosis of one or more joints. Finally, the patient was informed that Medicine is not an exact science; therefore, there is also the possibility of unforeseen or unpredictable risks and/or possible complications that may result in a catastrophic outcome. The patient indicated having understood very clearly. We have given the patient no guarantees and we have made no promises. Enough time was given to the patient to ask questions, all of which were answered to the patient's satisfaction. [name] has indicated that he wanted to continue with the procedure. Attestation: I, the ordering provider, attest that I have discussed with the patient the benefits, risks, side-effects, alternatives, likelihood of achieving goals, and potential problems during recovery for the procedure that I have provided informed consent. Date  Time: 02/23/2018  8:50 AM  Pre-Procedure Preparation:  Monitoring: As per clinic protocol. Respiration, ETCO2, SpO2, BP, heart rate and rhythm monitor placed and checked for adequate function Safety Precautions: Patient was assessed for positional comfort and pressure points before starting the  procedure. Time-out: I initiated and conducted the "Time-out" before starting the procedure, as per protocol. The patient was asked to participate by confirming the accuracy of the "Time Out" information. Verification of the correct person, site, and procedure were performed and confirmed by me, the nursing staff, and the patient. "Time-out" conducted as per Joint Commission's Universal Protocol (UP.01.01.01). Time: 0930  Description of Procedure Process:   Area Prepped: Entire shoulder Area Prepping solution: ChloraPrep (2% chlorhexidine gluconate and 70% isopropyl alcohol) Safety Precautions: Aspiration looking for blood return was conducted prior to all injections. At no point did we inject any substances, as a needle was being advanced. No attempts were made at seeking any paresthesias. Safe injection practices and needle disposal techniques used. Medications properly checked for expiration dates. SDV (single dose vial) medications used. Description of the Procedure: Protocol guidelines were followed. The patient was placed in position over the procedure table. The target area was identified and the area prepped in the usual manner. Skin & deeper tissues infiltrated with local anesthetic. Appropriate amount of time allowed to pass for local anesthetics to take effect. The procedure needles were then advanced to the target area. Proper needle placement secured. Negative aspiration confirmed. Solution injected in intermittent fashion, asking for systemic symptoms every 0.5cc of injectate. The needles were then removed and the area cleansed, making sure to leave some of the prepping solution back to take advantage of its long term bactericidal properties.  Vitals:   02/23/18 0903 02/23/18 0938  BP: (!) 119/91 117/86  Pulse: 80 78  Resp: 18 16  Temp: 97.8 F (36.6 C)   TempSrc: Oral   SpO2: 96%   Weight: 207 lb (93.9 kg)   Height: 6\' 1"  (1.854 m)     Start Time: 0930 hrs. End Time: 0935  hrs. Materials:  Needle(s) Type: Regular needle Gauge: 25G Length: 3.5-in Medication(s): Please see orders for medications and dosing details. 5 cc solution of 4 cc of 0.2% ropivacaine, 1 cc of Decadron 10 mg/cc.  2.5 cc injected in each shoulder joint. Imaging Guidance (Non-Spinal):  Type of  Imaging Technique: Fluoroscopy Guidance (Non-Spinal) Indication(s): Assistance in needle guidance and placement for procedures requiring needle placement in or near specific anatomical locations not easily accessible without such assistance. Exposure Time: Please see nurses notes. Contrast: None used. Fluoroscopic Guidance: I was personally present during the use of fluoroscopy. "Tunnel Vision Technique" used to obtain the best possible view of the target area. Parallax error corrected before commencing the procedure. "Direction-depth-direction" technique used to introduce the needle under continuous pulsed fluoroscopy. Once target was reached, antero-posterior, oblique, and lateral fluoroscopic projection used confirm needle placement in all planes. Images permanently stored in EMR. Interpretation: No contrast injected. I personally interpreted the imaging intraoperatively. Adequate needle placement confirmed in multiple planes. Permanent images saved into the patient's record.  Antibiotic Prophylaxis:   Anti-infectives (From admission, onward)   None     Indication(s): None identified  Post-operative Assessment:  Post-procedure Vital Signs:  Pulse/HCG Rate: 78  Temp: 97.8 F (36.6 C) Resp: 16 BP: 117/86 SpO2: 96 %  EBL: None  Complications: No immediate post-treatment complications observed by team, or reported by patient.  Note: The patient tolerated the entire procedure well. A repeat set of vitals were taken after the procedure and the patient was kept under observation following institutional policy, for this type of procedure. Post-procedural neurological assessment was performed,  showing return to baseline, prior to discharge. The patient was provided with post-procedure discharge instructions, including a section on how to identify potential problems. Should any problems arise concerning this procedure, the patient was given instructions to immediately contact us, at any time, without hesitation. In any case, we plan to contact the patient by telephone for a follow-up status report regarding this interventional procedure.  Comments:  No additional relevant information.   Plan of Care   Imaging Orders  No imaging studies ordered today   Procedure Orders    No procedure(s) ordered today    Medications ordered for procedure: Meds ordered this encounter  Medications  . lidocaine (XYLOCAINE) 1 % (with pres) injection 10 mL  . ropivacaine (PF) 2 mg/mL (0.2%) (NAROPIN) injection 10 mL  . dexamethasone (DECADRON) injection 10 mg   Medications administered: We administered lidocaine, ropivacaine (PF) 2 mg/mL (0.2%), and dexamethasone.  See the medical record for exact dosing, route, and time of administration.  New Prescriptions   No medications on file   Disposition: Discharge home  Discharge Date & Time: 02/23/2018; 0941 hrs.   Physician-requested Follow-up: No follow-ups on file.  Future Appointments  Date Time Provider Department Center  03/08/2018  2:30 PM [facility] PH[facility]AC[facility]  03/29/2018  1:00 PM Edward Jolly, MD [facility] None  08/15/2018  8:00 AM [facility] - [facility]  08/15/2018  9:00 AM [facility] - [facility]  08/15/2018  9:45 AM Nickel, Carma Lair, NP [facility]   Primary Care Physician: [name], PA-C Location: [facility] Outpatient Pain Management Facility Note by: Edward Jolly, MD Date: 02/23/2018; Time: 9:58 AM  Disclaimer:  Medicine is not an exact science. The only guarantee in medicine is that nothing is guaranteed. It is important to note that the decision to proceed with this  intervention was based on the information collected from the patient. The Data and conclusions were drawn from the patient's questionnaire, the interview, and the physical examination. Because the information was provided in large part by the patient, it cannot be guaranteed that it has not been purposely or unconsciously manipulated. Every effort has been made  to obtain as much relevant data as possible for this evaluation. It is important to note that the conclusions that lead to this procedure are derived in large part from the available data. Always take into account that the treatment will also be dependent on availability of resources and existing treatment guidelines, considered by other Pain Management Practitioners as being common knowledge and practice, at the time of the intervention. For Medico-Legal purposes, it is also important to point out that variation in procedural techniques and pharmacological choices are the acceptable norm. The indications, contraindications, technique, and results of the above procedure should only be interpreted and judged by a Board-Certified Interventional Pain Specialist with extensive familiarity and expertise in the same exact procedure and technique.

## 2018-02-23 NOTE — Patient Instructions (Signed)

## 2018-02-24 ENCOUNTER — Telehealth: Payer: Self-pay | Admitting: *Deleted

## 2018-02-24 NOTE — Telephone Encounter (Signed)
Voicemail left for patient to call our office if there are any questions or concerns re; procedure on yesterday. 

## 2018-03-08 ENCOUNTER — Encounter: Payer: Self-pay | Admitting: Pharmacist

## 2018-03-08 ENCOUNTER — Ambulatory Visit: Payer: Medicaid Other | Admitting: Pharmacist

## 2018-03-08 VITALS — BP 122/84 | HR 93

## 2018-03-08 DIAGNOSIS — I10 Essential (primary) hypertension: Secondary | ICD-10-CM

## 2018-03-08 DIAGNOSIS — I5041 Acute combined systolic (congestive) and diastolic (congestive) heart failure: Secondary | ICD-10-CM

## 2018-03-08 MED ORDER — CARVEDILOL 25 MG PO TABS: 25.0000 mg | ORAL_TABLET | Freq: Two times a day (BID) | ORAL | 11 refills | Status: DC

## 2018-03-08 NOTE — Progress Notes (Signed)
Patient ID: [name]                 DOB: [birth year]                      MRN: [id number]     HPI: [name] is a 62 y.o. male patient of Dr. Delton See who presents today for hypertension follow up. PMH significant for CAD s/p MI x3 s/p CABG x4, stroke in January 2018 s/p right endarterectomy, tobacco abuse, HTN, PVD, chronic CHF, ILD, and RA. He was previously followed by cardiology at [facility] during which time he was noncompliant with his medications and refused echo and stress test. 2D echo at [facility] 01/2017 revealed LVEF 35-40%, LHC 3 days later revealed LVEF < 30%. At his most recent OV in HTN clinic his carvedilol was increased to 12.5mg  BID. His Entresto was continued at the middle dose. BMET WNL at that visit  He presents today for follow up with wife. He states that he has felt well since our last visit. He endorses less headaches and denies dizziness, chest pain, and SOB.    Current HF meds: carvedilol 12.5mg  BID, Entresto 48/51mg  BID, spironolactone 25mg  daily, furosemide 20mg  daily  BP goal: <130/87mmHg  Family History: Mother - hear murmur, father - suicide, brother - stroke, grandmother - heart attack.  Social History: Smoking 1/2 PPD for 47 years. Denies alcohol and illicit drug use.  Diet: Likes sandwiches, eggs, grits, toast, bacon or sausage a few times a week, hot pockets. 1-2 cups of coffee each day and 1/2 gallon of sweet tea each day. Drinks 5-6 regular sodas each day.  Exercise: Walks around the house, ambulates with a cane due to stroke.  Home BP readings: did not bring log but reports WNL.    Wt Readings from Last 3 Encounters:  02/23/18 207 lb (93.9 kg)  02/09/18 205 lb (93 kg)  02/07/18 207 lb 3.2 oz (94 kg)   BP Readings from Last 3 Encounters:  03/08/18 122/84  02/23/18 117/86  02/09/18 119/73   Pulse Readings from Last 3 Encounters:  03/08/18 93  02/23/18 78  02/09/18 75    Renal function: CrCl cannot be calculated (Patient's most recent  lab result is older than the maximum 21 days allowed.).  Past Medical History:  Diagnosis Date  . AAA (abdominal aortic aneurysm) (HCC)   . Arthritis   . CAD in native artery    a. h/o MI x 3 s/p CABG x 4 (1993 with 04/11/18). b. LHC 01/2017 with severe native disease, 2 grafts occluded.  . Carotid artery disease (HCC)    a. s/p R CEA 11/2016.  03/2017 Chronic combined systolic and diastolic CHF (congestive heart failure) (HCC)   . GERD (gastroesophageal reflux disease)   . ILD (interstitial lung disease) (HCC)   . Lung nodule   . MI (myocardial infarction) (HCC)    x 3   . Normocytic anemia   . NSVT (nonsustained ventricular tachycardia) (HCC)   . Pancreatitis    1/19  . Pneumonia 11/2017  . RA (rheumatoid arthritis) (HCC)   . Stroke (HCC)   . Tobacco abuse     Current Outpatient Medications on File Prior to Visit  Medication Sig Dispense Refill  . acetaminophen (TYLENOL) 500 MG tablet Take 1,000 mg by mouth every 6 (six) hours as needed for mild pain.     2/19 albuterol (ACCUNEB) 0.63 MG/3ML nebulizer solution Take 3 mLs by nebulization every 6 (  six) hours as needed for wheezing.  12  . albuterol (PROVENTIL HFA;VENTOLIN HFA) 108 (90 Base) MCG/ACT inhaler Inhale 2 puffs into the lungs every 6 (six) hours as needed for wheezing or shortness of breath. 1 Inhaler 6  . aspirin 81 MG chewable tablet Chew 1 tablet (81 mg total) by mouth daily. 30 tablet 0  . atorvastatin (LIPITOR) 40 MG tablet Take 1 tablet (40 mg total) by mouth daily at 6 PM. 30 tablet 11  . furosemide (LASIX) 20 MG tablet Take 1 tablet (20 mg total) by mouth daily as needed for fluid or edema. 90 tablet 3  . gabapentin (NEURONTIN) 300 MG capsule Take 1 capsule (300 mg total) by mouth every 8 (eight) hours. 90 capsule 4  . HYDROcodone-acetaminophen (NORCO) 7.5-325 MG tablet Take 1 tablet by mouth 3 (three) times daily as needed for moderate pain. DNF: 02/02/18, 03/03/18 For chronic pain Ok to fill 1 day early if pharmacy closed on  fill date 90 tablet 0  . hydroxychloroquine (PLAQUENIL) 200 MG tablet Take 200 mg by mouth 2 (two) times daily.  3  . Menthol-Methyl Salicylate (MUSCLE RUB EX) Apply 1 application topically as needed (for shoulder or knee pain).     . OXYGEN Inhale 2 L into the lungs at bedtime.    . predniSONE (DELTASONE) 5 MG tablet Take 5mg  twice daily.    . sacubitril-valsartan (ENTRESTO) 49-51 MG Take 1 tablet by mouth 2 (two) times daily. 60 tablet 11  . spironolactone (ALDACTONE) 25 MG tablet Take 1 tablet (25 mg total) by mouth daily. 30 tablet 11   No current facility-administered medications on file prior to visit.     No Known Allergies  Blood pressure 122/84, pulse 93.   Assessment/Plan: Hypertension/medication optimization: BP ok to today. Will increase carvedilol to max dose today 25mg  BID. Will consider increasing Entresto at next visit if pressures are able to tolerate. Follow up in HTN clinic in 4-6 weeks for additional medication titration.   Thank you, . , PharmD  [facility]  03/08/2018 3:10 PM

## 2018-03-08 NOTE — Patient Instructions (Addendum)
INCREASE carvedilol to 25mg  (2 tablets of your current supply) TWICE daily   Continue all medications as prescribed.

## 2018-03-14 ENCOUNTER — Other Ambulatory Visit: Payer: Self-pay

## 2018-03-14 DIAGNOSIS — I6523 Occlusion and stenosis of bilateral carotid arteries: Secondary | ICD-10-CM

## 2018-03-14 DIAGNOSIS — I714 Abdominal aortic aneurysm, without rupture, unspecified: Secondary | ICD-10-CM

## 2018-03-25 ENCOUNTER — Other Ambulatory Visit: Payer: Self-pay | Admitting: Internal Medicine

## 2018-03-29 ENCOUNTER — Encounter: Payer: Self-pay | Admitting: Student in an Organized Health Care Education/Training Program

## 2018-03-29 ENCOUNTER — Other Ambulatory Visit: Payer: Self-pay

## 2018-03-29 ENCOUNTER — Ambulatory Visit
Payer: Medicaid Other | Attending: Student in an Organized Health Care Education/Training Program | Admitting: Student in an Organized Health Care Education/Training Program

## 2018-03-29 VITALS — BP 106/76 | HR 84 | Temp 97.6°F | Resp 18 | Ht 73.0 in | Wt 207.0 lb

## 2018-03-29 DIAGNOSIS — I110 Hypertensive heart disease with heart failure: Secondary | ICD-10-CM | POA: Diagnosis not present

## 2018-03-29 DIAGNOSIS — Z5181 Encounter for therapeutic drug level monitoring: Secondary | ICD-10-CM | POA: Diagnosis not present

## 2018-03-29 DIAGNOSIS — Z8739 Personal history of other diseases of the musculoskeletal system and connective tissue: Secondary | ICD-10-CM

## 2018-03-29 DIAGNOSIS — Z951 Presence of aortocoronary bypass graft: Secondary | ICD-10-CM | POA: Diagnosis not present

## 2018-03-29 DIAGNOSIS — M25511 Pain in right shoulder: Secondary | ICD-10-CM

## 2018-03-29 DIAGNOSIS — M19012 Primary osteoarthritis, left shoulder: Secondary | ICD-10-CM

## 2018-03-29 DIAGNOSIS — Z8673 Personal history of transient ischemic attack (TIA), and cerebral infarction without residual deficits: Secondary | ICD-10-CM | POA: Diagnosis not present

## 2018-03-29 DIAGNOSIS — K219 Gastro-esophageal reflux disease without esophagitis: Secondary | ICD-10-CM | POA: Insufficient documentation

## 2018-03-29 DIAGNOSIS — I5042 Chronic combined systolic (congestive) and diastolic (congestive) heart failure: Secondary | ICD-10-CM | POA: Diagnosis not present

## 2018-03-29 DIAGNOSIS — I252 Old myocardial infarction: Secondary | ICD-10-CM | POA: Insufficient documentation

## 2018-03-29 DIAGNOSIS — G629 Polyneuropathy, unspecified: Secondary | ICD-10-CM | POA: Diagnosis not present

## 2018-03-29 DIAGNOSIS — J849 Interstitial pulmonary disease, unspecified: Secondary | ICD-10-CM | POA: Diagnosis not present

## 2018-03-29 DIAGNOSIS — M542 Cervicalgia: Secondary | ICD-10-CM | POA: Diagnosis not present

## 2018-03-29 DIAGNOSIS — M25562 Pain in left knee: Secondary | ICD-10-CM | POA: Diagnosis not present

## 2018-03-29 DIAGNOSIS — I2510 Atherosclerotic heart disease of native coronary artery without angina pectoris: Secondary | ICD-10-CM | POA: Insufficient documentation

## 2018-03-29 DIAGNOSIS — G8929 Other chronic pain: Secondary | ICD-10-CM

## 2018-03-29 DIAGNOSIS — M25561 Pain in right knee: Secondary | ICD-10-CM

## 2018-03-29 DIAGNOSIS — M25512 Pain in left shoulder: Secondary | ICD-10-CM

## 2018-03-29 DIAGNOSIS — Z7982 Long term (current) use of aspirin: Secondary | ICD-10-CM | POA: Diagnosis not present

## 2018-03-29 DIAGNOSIS — Z7952 Long term (current) use of systemic steroids: Secondary | ICD-10-CM | POA: Diagnosis not present

## 2018-03-29 DIAGNOSIS — Z79899 Other long term (current) drug therapy: Secondary | ICD-10-CM | POA: Insufficient documentation

## 2018-03-29 DIAGNOSIS — M170 Bilateral primary osteoarthritis of knee: Secondary | ICD-10-CM | POA: Diagnosis not present

## 2018-03-29 DIAGNOSIS — I714 Abdominal aortic aneurysm, without rupture: Secondary | ICD-10-CM | POA: Insufficient documentation

## 2018-03-29 DIAGNOSIS — G894 Chronic pain syndrome: Secondary | ICD-10-CM

## 2018-03-29 DIAGNOSIS — M79642 Pain in left hand: Secondary | ICD-10-CM | POA: Diagnosis not present

## 2018-03-29 DIAGNOSIS — M069 Rheumatoid arthritis, unspecified: Secondary | ICD-10-CM | POA: Insufficient documentation

## 2018-03-29 DIAGNOSIS — M19011 Primary osteoarthritis, right shoulder: Secondary | ICD-10-CM

## 2018-03-29 DIAGNOSIS — F17210 Nicotine dependence, cigarettes, uncomplicated: Secondary | ICD-10-CM | POA: Insufficient documentation

## 2018-03-29 MED ORDER — HYDROCODONE-ACETAMINOPHEN 10-325 MG PO TABS: 1.0000 | ORAL_TABLET | Freq: Three times a day (TID) | ORAL | 0 refills | Status: DC | PRN

## 2018-03-29 NOTE — Patient Instructions (Addendum)
You have been given 3 Rx for Hydrocodone/APAP to last until 06/27/2018

## 2018-03-29 NOTE — Progress Notes (Signed)
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  1:00 PM Gillis Santa, MD [facility] None  08/15/2018  9:00 AM [facility] 6 - PS [facility]  08/15/2018  9:45 AM Nickel, Sharmon Leyden, NP [facility]    Primary Care Physician: [name], PA-C Location: [facility] Facility Note by: Gillis Santa, M.D Date: 03/29/2018; Time: 3:03 PM  Patient Instructions  You have been given 3 Rx for Hydrocodone/APAP to last until 06/27/2018

## 2018-03-29 NOTE — Progress Notes (Signed)
Nursing Pain Medication Assessment:  Safety precautions to be maintained throughout the outpatient stay will include: orient to surroundings, keep bed in low position, maintain call bell within reach at all times, provide assistance with transfer out of bed and ambulation.  Medication Inspection Compliance: Pill count conducted under aseptic conditions, in front of the patient. Neither the pills nor the bottle was removed from the patient's sight at any time. Once count was completed pills were immediately returned to the patient in their original bottle.  Medication: Hydrocodone/APAP Pill/Patch Count: 5 of 90 pills remain Pill/Patch Appearance: Markings consistent with prescribed medication Bottle Appearance: Standard pharmacy container. Clearly labeled. Filled Date: 05 / 02 / 2019 Last Medication intake:  Today

## 2018-03-30 ENCOUNTER — Telehealth: Payer: Self-pay | Admitting: Student in an Organized Health Care Education/Training Program

## 2018-03-30 NOTE — Telephone Encounter (Signed)
Patient notified that PA request sent for Hydrocodone 10/325mg .

## 2018-03-30 NOTE — Telephone Encounter (Signed)
Needs prior auth for scripts, please call if any questions

## 2018-04-04 ENCOUNTER — Emergency Department (HOSPITAL_COMMUNITY): Payer: Medicaid Other

## 2018-04-04 ENCOUNTER — Encounter (HOSPITAL_COMMUNITY): Payer: Self-pay | Admitting: Emergency Medicine

## 2018-04-04 ENCOUNTER — Inpatient Hospital Stay (HOSPITAL_COMMUNITY)
Admission: EM | Admit: 2018-04-04 | Discharge: 2018-04-12 | DRG: 643 | Disposition: A | Discharge status: Skilled Nursing Facility | Payer: Medicaid Other | Attending: Family Medicine | Admitting: Family Medicine

## 2018-04-04 DIAGNOSIS — I361 Nonrheumatic tricuspid (valve) insufficiency: Secondary | ICD-10-CM | POA: Diagnosis not present

## 2018-04-04 DIAGNOSIS — R404 Transient alteration of awareness: Secondary | ICD-10-CM | POA: Diagnosis not present

## 2018-04-04 DIAGNOSIS — I639 Cerebral infarction, unspecified: Secondary | ICD-10-CM

## 2018-04-04 DIAGNOSIS — E871 Hypo-osmolality and hyponatremia: Secondary | ICD-10-CM | POA: Diagnosis present

## 2018-04-04 DIAGNOSIS — E875 Hyperkalemia: Secondary | ICD-10-CM | POA: Diagnosis present

## 2018-04-04 DIAGNOSIS — J9611 Chronic respiratory failure with hypoxia: Secondary | ICD-10-CM | POA: Diagnosis present

## 2018-04-04 DIAGNOSIS — I2510 Atherosclerotic heart disease of native coronary artery without angina pectoris: Secondary | ICD-10-CM | POA: Diagnosis present

## 2018-04-04 DIAGNOSIS — I5042 Chronic combined systolic (congestive) and diastolic (congestive) heart failure: Secondary | ICD-10-CM | POA: Diagnosis present

## 2018-04-04 DIAGNOSIS — R339 Retention of urine, unspecified: Secondary | ICD-10-CM | POA: Diagnosis not present

## 2018-04-04 DIAGNOSIS — F17210 Nicotine dependence, cigarettes, uncomplicated: Secondary | ICD-10-CM | POA: Diagnosis present

## 2018-04-04 DIAGNOSIS — Z720 Tobacco use: Secondary | ICD-10-CM

## 2018-04-04 DIAGNOSIS — I6330 Cerebral infarction due to thrombosis of unspecified cerebral artery: Secondary | ICD-10-CM

## 2018-04-04 DIAGNOSIS — L98429 Non-pressure chronic ulcer of back with unspecified severity: Secondary | ICD-10-CM | POA: Diagnosis present

## 2018-04-04 DIAGNOSIS — L89159 Pressure ulcer of sacral region, unspecified stage: Secondary | ICD-10-CM

## 2018-04-04 DIAGNOSIS — M069 Rheumatoid arthritis, unspecified: Secondary | ICD-10-CM | POA: Diagnosis present

## 2018-04-04 DIAGNOSIS — J449 Chronic obstructive pulmonary disease, unspecified: Secondary | ICD-10-CM | POA: Diagnosis not present

## 2018-04-04 DIAGNOSIS — K219 Gastro-esophageal reflux disease without esophagitis: Secondary | ICD-10-CM | POA: Diagnosis present

## 2018-04-04 DIAGNOSIS — I69354 Hemiplegia and hemiparesis following cerebral infarction affecting left non-dominant side: Secondary | ICD-10-CM | POA: Diagnosis not present

## 2018-04-04 DIAGNOSIS — Z23 Encounter for immunization: Secondary | ICD-10-CM | POA: Diagnosis not present

## 2018-04-04 DIAGNOSIS — G8929 Other chronic pain: Secondary | ICD-10-CM | POA: Diagnosis present

## 2018-04-04 DIAGNOSIS — E872 Acidosis: Secondary | ICD-10-CM | POA: Diagnosis present

## 2018-04-04 DIAGNOSIS — I248 Other forms of acute ischemic heart disease: Secondary | ICD-10-CM | POA: Diagnosis not present

## 2018-04-04 DIAGNOSIS — I6789 Other cerebrovascular disease: Secondary | ICD-10-CM | POA: Diagnosis present

## 2018-04-04 DIAGNOSIS — L89150 Pressure ulcer of sacral region, unstageable: Secondary | ICD-10-CM | POA: Diagnosis present

## 2018-04-04 DIAGNOSIS — Z801 Family history of malignant neoplasm of trachea, bronchus and lung: Secondary | ICD-10-CM

## 2018-04-04 DIAGNOSIS — I4589 Other specified conduction disorders: Secondary | ICD-10-CM | POA: Diagnosis present

## 2018-04-04 DIAGNOSIS — R911 Solitary pulmonary nodule: Secondary | ICD-10-CM | POA: Diagnosis present

## 2018-04-04 DIAGNOSIS — L89312 Pressure ulcer of right buttock, stage 2: Secondary | ICD-10-CM | POA: Diagnosis not present

## 2018-04-04 DIAGNOSIS — E2740 Unspecified adrenocortical insufficiency: Principal | ICD-10-CM | POA: Diagnosis present

## 2018-04-04 DIAGNOSIS — Z9981 Dependence on supplemental oxygen: Secondary | ICD-10-CM

## 2018-04-04 DIAGNOSIS — I69392 Facial weakness following cerebral infarction: Secondary | ICD-10-CM

## 2018-04-04 DIAGNOSIS — Z951 Presence of aortocoronary bypass graft: Secondary | ICD-10-CM

## 2018-04-04 DIAGNOSIS — I110 Hypertensive heart disease with heart failure: Secondary | ICD-10-CM | POA: Diagnosis present

## 2018-04-04 DIAGNOSIS — Z77120 Contact with and (suspected) exposure to mold (toxic): Secondary | ICD-10-CM

## 2018-04-04 DIAGNOSIS — I25118 Atherosclerotic heart disease of native coronary artery with other forms of angina pectoris: Secondary | ICD-10-CM | POA: Diagnosis present

## 2018-04-04 DIAGNOSIS — M05711 Rheumatoid arthritis with rheumatoid factor of right shoulder without organ or systems involvement: Secondary | ICD-10-CM | POA: Diagnosis not present

## 2018-04-04 DIAGNOSIS — I340 Nonrheumatic mitral (valve) insufficiency: Secondary | ICD-10-CM | POA: Diagnosis not present

## 2018-04-04 DIAGNOSIS — R571 Hypovolemic shock: Secondary | ICD-10-CM | POA: Diagnosis present

## 2018-04-04 DIAGNOSIS — I252 Old myocardial infarction: Secondary | ICD-10-CM

## 2018-04-04 DIAGNOSIS — R579 Shock, unspecified: Secondary | ICD-10-CM | POA: Diagnosis present

## 2018-04-04 DIAGNOSIS — I25810 Atherosclerosis of coronary artery bypass graft(s) without angina pectoris: Secondary | ICD-10-CM | POA: Diagnosis not present

## 2018-04-04 DIAGNOSIS — Z823 Family history of stroke: Secondary | ICD-10-CM

## 2018-04-04 DIAGNOSIS — K5900 Constipation, unspecified: Secondary | ICD-10-CM | POA: Diagnosis not present

## 2018-04-04 DIAGNOSIS — J849 Interstitial pulmonary disease, unspecified: Secondary | ICD-10-CM | POA: Diagnosis present

## 2018-04-04 DIAGNOSIS — I6930 Unspecified sequelae of cerebral infarction: Secondary | ICD-10-CM

## 2018-04-04 DIAGNOSIS — N179 Acute kidney failure, unspecified: Secondary | ICD-10-CM

## 2018-04-04 DIAGNOSIS — Z7982 Long term (current) use of aspirin: Secondary | ICD-10-CM

## 2018-04-04 DIAGNOSIS — Z7952 Long term (current) use of systemic steroids: Secondary | ICD-10-CM

## 2018-04-04 DIAGNOSIS — Z8249 Family history of ischemic heart disease and other diseases of the circulatory system: Secondary | ICD-10-CM

## 2018-04-04 DIAGNOSIS — R29704 NIHSS score 4: Secondary | ICD-10-CM | POA: Diagnosis not present

## 2018-04-04 DIAGNOSIS — A419 Sepsis, unspecified organism: Secondary | ICD-10-CM

## 2018-04-04 DIAGNOSIS — R197 Diarrhea, unspecified: Secondary | ICD-10-CM | POA: Diagnosis not present

## 2018-04-04 DIAGNOSIS — I6381 Other cerebral infarction due to occlusion or stenosis of small artery: Secondary | ICD-10-CM | POA: Diagnosis not present

## 2018-04-04 DIAGNOSIS — I714 Abdominal aortic aneurysm, without rupture, unspecified: Secondary | ICD-10-CM

## 2018-04-04 DIAGNOSIS — R001 Bradycardia, unspecified: Secondary | ICD-10-CM | POA: Diagnosis not present

## 2018-04-04 DIAGNOSIS — I10 Essential (primary) hypertension: Secondary | ICD-10-CM | POA: Diagnosis not present

## 2018-04-04 DIAGNOSIS — D649 Anemia, unspecified: Secondary | ICD-10-CM | POA: Diagnosis present

## 2018-04-04 DIAGNOSIS — D72829 Elevated white blood cell count, unspecified: Secondary | ICD-10-CM | POA: Diagnosis present

## 2018-04-04 DIAGNOSIS — F0390 Unspecified dementia without behavioral disturbance: Secondary | ICD-10-CM | POA: Diagnosis present

## 2018-04-04 DIAGNOSIS — G8311 Monoplegia of lower limb affecting right dominant side: Secondary | ICD-10-CM | POA: Diagnosis not present

## 2018-04-04 DIAGNOSIS — R69 Illness, unspecified: Secondary | ICD-10-CM | POA: Diagnosis present

## 2018-04-04 DIAGNOSIS — D62 Acute posthemorrhagic anemia: Secondary | ICD-10-CM

## 2018-04-04 DIAGNOSIS — E8729 Other acidosis: Secondary | ICD-10-CM | POA: Diagnosis present

## 2018-04-04 DIAGNOSIS — R748 Abnormal levels of other serum enzymes: Secondary | ICD-10-CM | POA: Diagnosis not present

## 2018-04-04 LAB — CBC WITH DIFFERENTIAL/PLATELET
Abs Immature Granulocytes: 0.1 10*3/uL (ref 0.0–0.1)
Basophils Absolute: 0 10*3/uL (ref 0.0–0.1)
Basophils Relative: 0 %
EOS ABS: 0.1 10*3/uL (ref 0.0–0.7)
Eosinophils Relative: 1 %
HEMATOCRIT: 33.1 % — AB (ref 39.0–52.0)
HEMOGLOBIN: 10.9 g/dL — AB (ref 13.0–17.0)
Immature Granulocytes: 1 %
LYMPHS ABS: 1.1 10*3/uL (ref 0.7–4.0)
Lymphocytes Relative: 7 %
MCH: 29.5 pg (ref 26.0–34.0)
MCHC: 32.9 g/dL (ref 30.0–36.0)
MCV: 89.7 fL (ref 78.0–100.0)
MONOS PCT: 4 %
Monocytes Absolute: 0.5 10*3/uL (ref 0.1–1.0)
Neutro Abs: 12.8 10*3/uL — ABNORMAL HIGH (ref 1.7–7.7)
Neutrophils Relative %: 87 %
Platelets: 192 10*3/uL (ref 150–400)
RBC: 3.69 MIL/uL — AB (ref 4.22–5.81)
RDW: 15.3 % (ref 11.5–15.5)
WBC: 14.6 10*3/uL — AB (ref 4.0–10.5)

## 2018-04-04 LAB — COMPREHENSIVE METABOLIC PANEL
ALK PHOS: 76 U/L (ref 38–126)
ALT: 34 U/L (ref 17–63)
AST: 92 U/L — AB (ref 15–41)
Albumin: 2.7 g/dL — ABNORMAL LOW (ref 3.5–5.0)
Anion gap: 17 — ABNORMAL HIGH (ref 5–15)
BUN: 78 mg/dL — AB (ref 6–20)
CALCIUM: 8 mg/dL — AB (ref 8.9–10.3)
CO2: 18 mmol/L — AB (ref 22–32)
CREATININE: 8.19 mg/dL — AB (ref 0.61–1.24)
Chloride: 98 mmol/L — ABNORMAL LOW (ref 101–111)
GFR calc non Af Amer: 6 mL/min — ABNORMAL LOW (ref >60)
GFR, EST AFRICAN AMERICAN: 7 mL/min — AB (ref >60)
Glucose, Bld: 76 mg/dL (ref 65–99)
Potassium: 5.6 mmol/L — ABNORMAL HIGH (ref 3.5–5.1)
SODIUM: 133 mmol/L — AB (ref 135–145)
Total Bilirubin: 1.1 mg/dL (ref 0.3–1.2)
Total Protein: 6.3 g/dL — ABNORMAL LOW (ref 6.5–8.1)

## 2018-04-04 LAB — I-STAT CHEM 8, ED
BUN: 85 mg/dL — AB (ref 6–20)
CALCIUM ION: 0.99 mmol/L — AB (ref 1.15–1.40)
CHLORIDE: 100 mmol/L — AB (ref 101–111)
CREATININE: 7.8 mg/dL — AB (ref 0.61–1.24)
Glucose, Bld: 71 mg/dL (ref 65–99)
HEMATOCRIT: 32 % — AB (ref 39.0–52.0)
Hemoglobin: 10.9 g/dL — ABNORMAL LOW (ref 13.0–17.0)
Potassium: 5.6 mmol/L — ABNORMAL HIGH (ref 3.5–5.1)
SODIUM: 131 mmol/L — AB (ref 135–145)
TCO2: 19 mmol/L — AB (ref 22–32)

## 2018-04-04 LAB — I-STAT ARTERIAL BLOOD GAS, ED
ACID-BASE DEFICIT: 15 mmol/L — AB (ref 0.0–2.0)
Bicarbonate: 12.3 mmol/L — ABNORMAL LOW (ref 20.0–28.0)
O2 SAT: 95 %
PH ART: 7.197 — AB (ref 7.350–7.450)
PO2 ART: 89 mmHg (ref 83.0–108.0)
Patient temperature: 99
TCO2: 13 mmol/L — ABNORMAL LOW (ref 22–32)
pCO2 arterial: 31.7 mmHg — ABNORMAL LOW (ref 32.0–48.0)

## 2018-04-04 LAB — PROTIME-INR
INR: 1.05
Prothrombin Time: 13.6 seconds (ref 11.4–15.2)

## 2018-04-04 LAB — I-STAT CG4 LACTIC ACID, ED
LACTIC ACID, VENOUS: 2.27 mmol/L — AB (ref 0.5–1.9)
LACTIC ACID, VENOUS: 1.02 mmol/L (ref 0.5–1.9)

## 2018-04-04 LAB — URINALYSIS, ROUTINE W REFLEX MICROSCOPIC
BILIRUBIN URINE: NEGATIVE
Glucose, UA: NEGATIVE mg/dL
Ketones, ur: NEGATIVE mg/dL
LEUKOCYTES UA: NEGATIVE
NITRITE: NEGATIVE
PH: 5 (ref 5.0–8.0)
Protein, ur: NEGATIVE mg/dL
SPECIFIC GRAVITY, URINE: 1.021 (ref 1.005–1.030)

## 2018-04-04 LAB — I-STAT TROPONIN, ED: Troponin i, poc: 0.63 ng/mL (ref 0.00–0.08)

## 2018-04-04 LAB — POC OCCULT BLOOD, ED: Fecal Occult Bld: NEGATIVE

## 2018-04-04 LAB — BRAIN NATRIURETIC PEPTIDE: B Natriuretic Peptide: 134.7 pg/mL — ABNORMAL HIGH (ref 0.0–100.0)

## 2018-04-04 MED ORDER — HYDROCORTISONE NA SUCCINATE PF 100 MG IJ SOLR
100.0000 mg | Freq: Three times a day (TID) | INTRAMUSCULAR | Status: DC
  Administered 2018-04-05 – 2018-04-06 (×4): 100 mg via INTRAVENOUS
  Filled 2018-04-04 (×4): qty 2

## 2018-04-04 MED ORDER — SODIUM BICARBONATE 8.4 % IV SOLN
INTRAVENOUS | Status: DC
  Administered 2018-04-05: 02:00:00 via INTRAVENOUS
  Filled 2018-04-04 (×3): qty 850

## 2018-04-04 MED ORDER — PIPERACILLIN-TAZOBACTAM 3.375 G IVPB 30 MIN
3.3750 g | Freq: Once | INTRAVENOUS | Status: AC
  Administered 2018-04-04: 3.375 g via INTRAVENOUS
  Filled 2018-04-04: qty 50

## 2018-04-04 MED ORDER — SODIUM CHLORIDE 0.9 % IV BOLUS (SEPSIS)
1000.0000 mL | Freq: Once | INTRAVENOUS | Status: AC
  Administered 2018-04-04: 1000 mL via INTRAVENOUS

## 2018-04-04 MED ORDER — HEPARIN SODIUM (PORCINE) 5000 UNIT/ML IJ SOLN
5000.0000 [IU] | Freq: Three times a day (TID) | INTRAMUSCULAR | Status: DC
  Administered 2018-04-05 – 2018-04-12 (×22): 5000 [IU] via SUBCUTANEOUS
  Filled 2018-04-04 (×23): qty 1

## 2018-04-04 MED ORDER — VANCOMYCIN HCL IN DEXTROSE 1-5 GM/200ML-% IV SOLN: 1000.0000 mg | INTRAVENOUS | Status: DC

## 2018-04-04 MED ORDER — SODIUM CHLORIDE 0.9 % IV BOLUS
1000.0000 mL | Freq: Once | INTRAVENOUS | Status: AC
  Administered 2018-04-04: 1000 mL via INTRAVENOUS

## 2018-04-04 MED ORDER — SODIUM CHLORIDE 0.9 % IV BOLUS (SEPSIS): 1000.0000 mL | Freq: Once | INTRAVENOUS | Status: DC

## 2018-04-04 MED ORDER — HYDROCORTISONE NA SUCCINATE PF 100 MG IJ SOLR
50.0000 mg | Freq: Four times a day (QID) | INTRAMUSCULAR | Status: DC
  Administered 2018-04-04: 100 mg via INTRAVENOUS

## 2018-04-04 MED ORDER — HYDROCORTISONE NA SUCCINATE PF 100 MG IJ SOLR
50.0000 mg | Freq: Once | INTRAMUSCULAR | Status: DC
  Filled 2018-04-04: qty 2

## 2018-04-04 MED ORDER — NOREPINEPHRINE 4 MG/250ML-% IV SOLN
0.0000 ug/min | INTRAVENOUS | Status: DC
  Filled 2018-04-04 (×2): qty 250

## 2018-04-04 MED ORDER — HYDROCORTISONE NA SUCCINATE PF 100 MG IJ SOLR: 100.0000 mg | Freq: Once | INTRAMUSCULAR | Status: DC

## 2018-04-04 MED ORDER — ATORVASTATIN CALCIUM 40 MG PO TABS
40.0000 mg | ORAL_TABLET | Freq: Every day | ORAL | Status: DC
  Administered 2018-04-05 – 2018-04-10 (×6): 40 mg via ORAL
  Filled 2018-04-04 (×7): qty 1

## 2018-04-04 MED ORDER — VANCOMYCIN HCL IN DEXTROSE 1-5 GM/200ML-% IV SOLN
1000.0000 mg | Freq: Once | INTRAVENOUS | Status: AC
  Administered 2018-04-04: 1000 mg via INTRAVENOUS
  Filled 2018-04-04: qty 200

## 2018-04-04 MED ORDER — NOREPINEPHRINE 4 MG/250ML-% IV SOLN
0.0000 ug/min | Freq: Once | INTRAVENOUS | Status: AC
  Administered 2018-04-04: 2 ug/min via INTRAVENOUS
  Filled 2018-04-04: qty 250

## 2018-04-04 MED ORDER — SODIUM CHLORIDE 0.9 % IV SOLN
INTRAVENOUS | Status: DC
  Administered 2018-04-04: 23:00:00 via INTRAVENOUS

## 2018-04-04 MED ORDER — PIPERACILLIN-TAZOBACTAM IN DEX 2-0.25 GM/50ML IV SOLN
2.2500 g | Freq: Three times a day (TID) | INTRAVENOUS | Status: DC
  Administered 2018-04-05 – 2018-04-06 (×4): 2.25 g via INTRAVENOUS
  Filled 2018-04-04 (×6): qty 50

## 2018-04-04 MED ORDER — SODIUM CHLORIDE 0.9 % IV SOLN
1.0000 g | Freq: Once | INTRAVENOUS | Status: AC
  Administered 2018-04-05: 1 g via INTRAVENOUS
  Filled 2018-04-04 (×2): qty 10

## 2018-04-04 MED ORDER — ASPIRIN 81 MG PO CHEW
81.0000 mg | CHEWABLE_TABLET | Freq: Every day | ORAL | Status: DC
  Administered 2018-04-05 – 2018-04-12 (×8): 81 mg via ORAL
  Filled 2018-04-04 (×8): qty 1

## 2018-04-04 MED ORDER — SODIUM CHLORIDE 0.9 % IV SOLN: 250.0000 mL | INTRAVENOUS | Status: DC | PRN

## 2018-04-04 MED ORDER — ALBUTEROL SULFATE (2.5 MG/3ML) 0.083% IN NEBU
2.5000 mg | INHALATION_SOLUTION | RESPIRATORY_TRACT | Status: DC | PRN
  Administered 2018-04-04: 2.5 mg via RESPIRATORY_TRACT
  Filled 2018-04-04: qty 3

## 2018-04-04 MED ORDER — SODIUM BICARBONATE 8.4 % IV SOLN
50.0000 meq | Freq: Once | INTRAVENOUS | Status: AC
  Administered 2018-04-05: 50 meq via INTRAVENOUS
  Filled 2018-04-04: qty 50

## 2018-04-04 NOTE — ED Provider Notes (Addendum)
I received pt in signout from Dr. Donnald Garre. We were awaiting completion of labs. Bladder US showed retention, foley placed with ~545ml urine output after 3L IVF. He continued to be hypotensive thus ordered 4th L IVF.  His labs show no evidence of infection in urine, troponin elevated at 0.63, creatinine approximately 8 with potassium 5.6, WBC 14.6, hemoglobin 10.9.  He had already received antibiotics prior to my arrival.  Chest x-ray negative acute, CT head negative acute.  His blood pressure slightly improved after fluids but then dropped again and he remained hypotensive despite 4 L of fluids.  Ordered levophed. I noted that per Dr. Fabian Sharp note, he has been on prednisone daily for the past several weeks. Ordered hydrocortisone IV. Discussed w/ CCM, Dr. Dellie Catholic, and pt admitted to MICU for further care.   Little, Ambrose Finland, MD 04/05/18 0000  CRITICAL CARE Performed by: Ambrose Finland Little   Total critical care time: 30 minutes  Critical care time was exclusive of separately billable procedures and treating other patients.  Critical care was necessary to treat or prevent imminent or life-threatening deterioration.  Critical care was time spent personally by me on the following activities: development of treatment plan with patient and/or surrogate as well as nursing, discussions with consultants, evaluation of patient's response to treatment, examination of patient, obtaining history from patient or surrogate, ordering and performing treatments and interventions, ordering and review of laboratory studies, ordering and review of radiographic studies, pulse oximetry and re-evaluation of patient's condition.    Little, Ambrose Finland, MD 04/05/18 0000

## 2018-04-04 NOTE — ED Notes (Signed)
Notified provider of blood pressure and request for home pain medications.

## 2018-04-04 NOTE — Progress Notes (Addendum)
Pharmacy Antibiotic Note  [name] is a 62 y.o. male admitted on 04/04/2018 with sepsis.  Pharmacy has been consulted for vancomycin and zosyn dosing.  Patient came into ED with aphasia and weakness. Became hypotensive 70/50s mmHg in the ED. Lactic acidosis 2.27, Scr 7.80, rectal temp 99.1. Has large pressure wound on the buttocks, likely source of infection  CrCl ~ 10-15 ml/min   Plan: Vancomycin IV 1000 mg x 1 in ED,  Give another 1g of Vanc in the ED for a total of 2g load, then Vancomycin IV 1000 mg Q48h given renal function Zosyn 3.375g x 1 in ED, then 2.25g Q8h adjusted for renal function.  Monitor renal function and adjust antibiotics as needed Monitor clinical progression, BCx, and LOT    Height: (S) 6\' 1"  (185.4 cm) Weight: (S) 205 lb (93 kg) IBW/kg (Calculated) : 79.9  Temp (24hrs), Avg:98.3 F (36.8 C), Min:97.5 F (36.4 C), Max:99.1 F (37.3 C)  Recent Labs  Lab 04/04/18 1505 04/04/18 1617  WBC 14.6*  --   CREATININE  --  7.80*  LATICACIDVEN  --  2.27*    Estimated Creatinine Clearance: 11.1 mL/min (A) (by C-G formula based on SCr of 7.8 mg/dL (H)).    No Known Allergies  Antimicrobials this admission: 6/3 Vanc >>  6/3 zosyn >>   Dose adjustments this admission:   Microbiology results: 6/3 BCx:  UCx: Sputum:  MRSA PCR:  8/3, PharmD Pharmacy Resident Pager: [phone]

## 2018-04-04 NOTE — ED Notes (Signed)
Dr. Hardie Lora notified of elevated I-stat trop

## 2018-04-04 NOTE — ED Notes (Signed)
Dr. Hardie Lora notified of CG-4 and Chem-8 results

## 2018-04-04 NOTE — H&P (Addendum)
PULMONARY / CRITICAL CARE MEDICINE   Name: [name] MRN: [id number] DOB: 08/24/1956   ADMISSION DATE:  04/04/2018 CONSULTATION DATE:  04/04/18  REFERRING MD:  Clarene Duke  CHIEF COMPLAINT:  Weakness  HISTORY OF PRESENT ILLNESS:  [name] is a 62 y.o. 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 [name] and on chronic prednisone), CVA with residual left sided weakness, MI.  He presented to [facility] ED 6/3 due to generalized weakness.  Per sister, he was normal the day prior and when he woke up 6/3, he had profound weakness.  At baseline, pt is fairly sedentary.  He lives with sister who is primary caregiver.  Sister states he can perform most ADLs independently but does occasionally need help.   On day of presentation, pt could barely get out of bed and / or into his recliner and due to fear of him falling while trying, sister called EMS.   In ED, he was found to have hypotension (SBP in 70's), hyponatremia, hyperkalemia, AKI (SCr up to 8).  He remained hypotensive despite 4L IVF and was subsequently started on norepinephrine.  CT of the head was negative.  He had bladder scan that showed ~500cc urine.  After foley was placed, he has had roughly 900cc UOP since arrival in ED about 7 hours ago.  He denies any recent fevers/chills/sweats, headaches, chest pain, SOB, N/V/D, abd pain, myalgias, exposure to known sick contacts.  He has had dry cough for 3 - 4 days which has not changed.  He has been taking all of his medications as prescribed including anti-hypertensives.  He has been eating OK and drinking fairly well but per sister, has not been drinking as much water as he should (though does drink lots of tea and soda).  PAST MEDICAL HISTORY :  He  has a past medical history of AAA (abdominal aortic aneurysm) (HCC), Arthritis, CAD in native artery, Carotid artery disease (HCC),  Chronic combined systolic and diastolic CHF (congestive heart failure) (HCC), GERD (gastroesophageal reflux disease), ILD (interstitial lung disease) (HCC), Lung nodule, MI (myocardial infarction) (HCC), Normocytic anemia, NSVT (nonsustained ventricular tachycardia) (HCC), Pancreatitis, Pneumonia (11/2017), RA (rheumatoid arthritis) (HCC), Stroke (HCC), and Tobacco abuse.  PAST SURGICAL HISTORY: He  has a past surgical history that includes Cardiac catheterization; LEFT HEART CATH AND CORS/GRAFTS ANGIOGRAPHY (N/A, 03/01/2017); Carotid endarterectomy (Left); Heart Bypass (10/1992); and Throat Biopsy.  No Known Allergies  No current facility-administered medications on file prior to encounter.    Current Outpatient Medications on File Prior to Encounter  Medication Sig  . acetaminophen (TYLENOL) 500 MG tablet Take 1,000 mg by mouth every 6 (six) hours as needed for mild pain.   [name] albuterol (ACCUNEB) 0.63 MG/3ML nebulizer solution Take 3 mLs by nebulization every 6 (six) hours as needed for wheezing.  [name] amoxicillin (AMOXIL) 500 MG capsule Take 500 mg by mouth 4 (four) times daily. For 5 days  . aspirin EC 81 MG tablet Take 81 mg by mouth daily.  [name] atorvastatin (LIPITOR) 40 MG tablet Take 1 tablet (40 mg total) by mouth daily at 6 PM.  . carvedilol (COREG) 25 MG tablet Take 1 tablet (25 mg total) by mouth 2 (two) times daily with a meal. (Patient taking differently: Take 12.5 mg by mouth 2 (two) times daily with a meal. )  . furosemide (LASIX) 20  MG tablet Take 1 tablet (20 mg total) by mouth daily as needed for fluid or edema.  . gabapentin (NEURONTIN) 300 MG capsule Take 1 capsule (300 mg total) by mouth every 8 (eight) hours.  [name] HYDROcodone-acetaminophen (NORCO) 10-325 MG tablet Take 1 tablet by mouth 3 (three) times daily as needed for severe pain.  . hydroxychloroquine (PLAQUENIL) 200 MG tablet Take 200 mg by mouth 2 (two) times daily.  [name] losartan (COZAAR) 25 MG tablet Take 25 mg by mouth daily.   . Menthol-Methyl Salicylate (MUSCLE RUB EX) Apply 1 application topically as needed (for shoulder or knee pain).   . OXYGEN Inhale 2 L into the lungs at bedtime.  . predniSONE (DELTASONE) 10 MG tablet Take 10 mg daily  . PROVENTIL HFA 108 (90 Base) MCG/ACT inhaler INHALE 2 PUFFS EVERY 6 HOURS AS NEEDED FOR WHEEZE/SHORTNESS OF BREATH  . sacubitril-valsartan (ENTRESTO) 49-51 MG Take 1 tablet by mouth 2 (two) times daily.  [name] spironolactone (ALDACTONE) 25 MG tablet Take 1 tablet (25 mg total) by mouth daily.  [name] aspirin 81 MG chewable tablet Chew 1 tablet (81 mg total) by mouth daily. (Patient not taking: Reported on 04/04/2018)    FAMILY HISTORY:  His indicated that his mother is deceased. He indicated that his father is deceased. He indicated that his maternal grandmother is deceased. He indicated that his maternal grandfather is deceased. He indicated that his paternal grandmother is deceased. He indicated that his paternal grandfather is deceased. He indicated that his son is alive. He indicated that the status of his maternal uncle is unknown.   SOCIAL HISTORY: He  reports that he has been smoking cigarettes.  He has a 23.50 pack-year smoking history. He has never used smokeless tobacco. He reports that he does not drink alcohol or use drugs.  REVIEW OF SYSTEMS:   All negative; except for those that are bolded, which indicate positives.  Constitutional: weight loss, weight gain, night sweats, fevers, chills, fatigue, weakness.  HEENT: headaches, sore throat, sneezing, nasal congestion, post nasal drip, difficulty swallowing, tooth/dental problems, visual complaints, visual changes, ear aches. Neuro: difficulty with speech, weakness, numbness, ataxia. CV:  chest pain, orthopnea, PND, swelling in lower extremities, dizziness, palpitations, syncope.  Resp: cough, hemoptysis, dyspnea, wheezing. GI: heartburn, indigestion, abdominal pain, nausea, vomiting, diarrhea, constipation, change in bowel  habits, loss of appetite, hematemesis, melena, hematochezia.  GU: dysuria, change in color of urine, urgency or frequency, flank pain, hematuria. MSK: joint pain or swelling, decreased range of motion. Psych: change in mood or affect, depression, anxiety, suicidal ideations, homicidal ideations. Skin: rash, itching, bruising.   SUBJECTIVE:  Awake, denies chest pain, dyspnea.  Has slightly slurred speech.  VITAL SIGNS: BP (!) 81/58   Pulse 93   Temp (S) 99.1 F (37.3 C) (Rectal)   Resp (!) 30   Ht (S) 6\' 1"  (1.854 m)   Wt (S) 93 kg (205 lb)   SpO2 99%   BMI 27.05 kg/m   HEMODYNAMICS:    VENTILATOR SETTINGS:    INTAKE / OUTPUT: I/O last 3 completed shifts: In: 3050 [IV Piggyback:3050] Out: -    PHYSICAL EXAMINATION: General: Chronically ill appearing male, resting in bed, in NAD. Neuro: A&O x 3, though appears to have some confusion at baseline (? Dementia - answers questions regarding his medical history very vaguely and incorrectly at times). HEENT: [place]/AT. Sclerae anicteric, MM very dry. Cardiovascular: RRR, no M/R/G.  Lungs: Respirations even and unlabored.  Faint dry crackles. Abdomen: Obese, BS x  4, soft, NT/ND.  Musculoskeletal: No gross deformities, no edema.  Skin: Intact, warm, no rashes.  + skin tenting.    LABS:  BMET Recent Labs  Lab 04/04/18 1505 04/04/18 1617  NA 133* 131*  K 5.6* 5.6*  CL 98* 100*  CO2 18*  --   BUN 78* 85*  CREATININE 8.19* 7.80*  GLUCOSE 76 71    Electrolytes Recent Labs  Lab 04/04/18 1505  CALCIUM 8.0*    CBC Recent Labs  Lab 04/04/18 1505 04/04/18 1617  WBC 14.6*  --   HGB 10.9* 10.9*  HCT 33.1* 32.0*  PLT 192  --     Coag's Recent Labs  Lab 04/04/18 1505  INR 1.05    Sepsis Markers Recent Labs  Lab 04/04/18 1617 04/04/18 1756  LATICACIDVEN 2.27* 1.02    ABG No results for input(s): PHART, PCO2ART, PO2ART in the last 168 hours.  Liver Enzymes Recent Labs  Lab 04/04/18 1505  AST 92*   ALT 34  ALKPHOS 76  BILITOT 1.1  ALBUMIN 2.7*    Cardiac Enzymes No results for input(s): TROPONINI, PROBNP in the last 168 hours.  Glucose No results for input(s): GLUCAP in the last 168 hours.  Imaging Ct Head Wo Contrast  Result Date: 04/04/2018 CLINICAL DATA:  Bilateral leg weakness and slurred speech beginning at 4 a.m. today. Previous stroke and myocardial infarction EXAM: CT HEAD WITHOUT CONTRAST TECHNIQUE: Contiguous axial images were obtained from the base of the skull through the vertex without intravenous contrast. COMPARISON:  Head CT report dated 02/03/2002. FINDINGS: Brain: Diffusely enlarged ventricles and subarachnoid spaces. Patchy white matter low density in both cerebral hemispheres. 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 No intracranial hemorrhage, mass lesion or CT evidence of acute infarction. Vascular: No hyperdense vessel or unexpected calcification. Skull: There is some motion degradation at the skull base. No fractures are seen. Sinuses/Orbits: Unremarkable. Other: None. IMPRESSION: 1. No acute abnormality. 2. Mild diffuse cerebral and cerebellar atrophy. 3. 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 Electronically Signed   By: Beckie Salts M.D.   On: 04/04/2018 17:10   Dg Chest Port 1 View  Result Date: 04/04/2018 CLINICAL DATA:  Bilateral leg weakness, slurred speech EXAM: PORTABLE CHEST 1 VIEW COMPARISON:  11/01/2017 FINDINGS: Subpleural reticulation/fibrosis in the lungs bilaterally, suggesting chronic interstitial lung disease. No focal consolidation. There effusion Cardiomegaly.  Postsurgical changes related to prior CABG. Median sternotomy with stable fractured upper sternal wires. Old left posterior rib fractures. IMPRESSION: No evidence of acute cardiopulmonary disease. Stable chronic interstitial lung disease. Electronically Signed   By: Charline Bills M.D.   On: 04/04/2018 17:29     STUDIES:  CT head 6/3 > no  acute process. CXR 6/3 > no acute process. Echo 6/4 >   CULTURES: Blood 6/3 >  Urine 6/3 >   ANTIBIOTICS: Vanc 6/3 >  Zosyn 6/3 >   SIGNIFICANT EVENTS: 6/3 > admit.  LINES/TUBES: CVL pending 6/3 >  Art line pending 6/3 >   DISCUSSION: 62 y.o. male admitted 6/3 with hypotension, generalized weakness, AKI. Hypotension felt to be primarily hypovolemic + probable component adrenal insufficiency (on chronic steroids).  No clear source of infection at this point.  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 [name]). Chronic hypoxic respiratory failure - on 2L O2 nocturnally at baseline. Tobacco dependence. P:   Stress steroids. Continue supplemental O2 as needed to maintain SpO2 >  92%. CXR intermittently. Tobacco cessation counseling. F/u with Dr. [name] as an outpatient.  CARDIOVASCULAR A:  Shock - felt to be primarily hypovolemic + possible component adrenal insufficiency.  No clear evidence of infectious component at this time.  Lactate reassuring.  A repeat sepsis assessment has been performed. Hx NSVT, MI, HTN, CAD, CHF (echo from April 2018 with EF 35-40%, G1DD), AAA. P:  Continue fluids. Continue levophed, goal MAP > 65. Insert arterial line and central venous line. Once CVL in place, assess CVP's and co-ox. Assess cortisol and start empiric stress steroids. Assess echo. Continue preadmission ASA, atorvastatin. Hold preadmission carvedilol, furosemide, losartan, entresto, spironolactone.  RENAL A:   Hyponatremia. Mild hyperkalemia. AKI - presumed pre-renal from hypovolemia.  Exacerbated by furosemide, spironolactone, entresto. Oliguria - see above. Hypocalcemia. P:   NS @ 125. Strict I/O's. Consider renal US if no improvement with fluids. 1g Ca gluconate. Repeat BMP now and in AM.  GASTROINTESTINAL A:   Hx GERD. Nutrition. P:   NPO.  HEMATOLOGIC A:   VTE Prophylaxis. P:  SCD's / heparin. CBC in  AM.  INFECTIOUS A:   Shock - felt to be primarily hypovolemic + possible component adrenal insufficiency.  No clear evidence of infectious component at this time.  Lactate reassuring.  A repeat sepsis assessment has been performed. P:   Abx as above (vanc / zosyn).  Follow cultures as above. Assess PCT, low threshold to d/c abx if low.  ENDOCRINE A:   ? Adrenal insufficiency - on chronic steroids.   P:   Assess cortisol and start empiric stress dose steroids. Hold preadmission prednisone.  NEUROLOGIC A:   Generalized weakness - exacerbated by hypotension / shock.  Despite left sided weakness, no obvious reason to suspect acute infarct (He has left sided weakness chronically per he and his sister). ? Early dementia - is a poor historian and answers questions regarding his medical history very vaguely and incorrectly at times. P:   Continue supportive care. Avoid sedating meds. If no improvement, might need MRI and neuro consult. Hold preadmission gabapentin, norco.  Family updated: Sister updated at bedside.  Interdisciplinary Family Meeting v Palliative Care Meeting:  Due by: 04/10/18.  CC time: 35 minutes.   ADDENDUM 2345: Arterial line inserted and SBP now 130's on 34mcg/min of norepinephrine, MAPs high 70s to 80s.  Solucortef given and MIVF running at 125cc/hr.  Discussed with Dr. Ardeth Perfect, will defer on CVL for now and continue to monitor.  Our hope is that we will be able to gradually wean him off of the norepinephrine.   Rutherford Guys, [place] - [facility] [place] [facility] Pager: [phone]  or [phone] 04/04/2018, 10:45 PM

## 2018-04-04 NOTE — ED Notes (Addendum)
Admitting provider bedside.  Provider instructed RN to bolus 1 liter and leave the Levophed at per minute

## 2018-04-04 NOTE — ED Notes (Signed)
Per admitting provider, pt given half a cup of ice chips.

## 2018-04-04 NOTE — ED Triage Notes (Signed)
Per EMS: pt from home with c/o bilateral leg weakness and slurred speech that began at 0400 this AM.  Pt states he has a HX of stroke and MI.  Pt denies any pain, N/V, and dizziness.  Pt has no upper extremity weakness and no facial droop.  PTA vitals: BP 104/76, HR 92, RR 18, 97% on 3L [place].

## 2018-04-04 NOTE — Progress Notes (Signed)
PCCM Interval Progress Note  Events:  ABG drawn:  7.19/31.7/89.  Interventions: Will give 1amp HCO3 and start HCO3 gtt.   Rutherford Guys, [place] [facility] [place] [facility] Pager: [phone]  or [phone] 04/04/2018, 11:55 PM

## 2018-04-04 NOTE — ED Provider Notes (Signed)
[facility] EMERGENCY DEPARTMENT Provider Note   CSN: [id number] Arrival date & time: 04/04/18  1430     History   Chief Complaint Chief Complaint  Patient presents with  . Weakness  . Aphasia    HPI [name] is a 63 y.o. male.  HPI Lives with his sister.  He has medical history of stroke and prior MI.  At baseline he is limited in total ADLs.  He mostly spends his time in a recliner.  Patient however does get up to walk short distances to the table or to his bed.  He uses a cane.  He has pre-existing left-sided weakness.  His sister reports that this morning she went to check on him in his recliner and he would not get up.  He was too weak.  Both legs seemed weak and speech was slightly slurred.  Patient denies that he is having any pain.  He denies headache, chest pain or abdominal pain.  He has not had no recent vomiting or diarrheal illness.  No fever has been documented.  The patient and his sister did not notice localizing or increased facial droop or unilateral weakness.  (To my exam however patient has a fairly pronounced left-sided facial droop which his sister does endorse is worse than the baseline.)  Patient has been taking 5 mg prednisone twice daily for several weeks.  That was an increase from 5 mg daily that is for his rheumatoid arthritis. Past Medical History:  Diagnosis Date  . AAA (abdominal aortic aneurysm) (HCC)   . Arthritis   . CAD in native artery    a. h/o MI x 3 s/p CABG x 4 (1993 with Tyrone Sage). b. LHC 01/2017 with severe native disease, 2 grafts occluded.  . Carotid artery disease (HCC)    a. s/p R CEA 11/2016.  [name] Chronic combined systolic and diastolic CHF (congestive heart failure) (HCC)   . GERD (gastroesophageal reflux disease)   . ILD (interstitial lung disease) (HCC)   . Lung nodule   . MI (myocardial infarction) (HCC)    x 3   . Normocytic anemia   . NSVT (nonsustained ventricular tachycardia) (HCC)   . Pancreatitis    1/19  . Pneumonia 11/2017  . RA (rheumatoid arthritis) (HCC)   . Stroke (HCC)   . Tobacco abuse     Patient Active Problem List   Diagnosis Date Noted  . Cervicalgia 01/18/2018  . Chronic pain of both shoulders 01/18/2018  . Hx of rheumatoid arthritis 01/18/2018  . Chronic pain syndrome 01/18/2018  . Leukocytosis 12/15/2017  . Cough   . Pneumonia 11/01/2017  . Elevated lipase   . Elevated liver enzymes   . Acute pancreatitis 08/08/2017  . Carotid artery disease (HCC) 05/12/2017  . 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 Chronic combined systolic and diastolic CHF (congestive heart failure) (HCC) 03/07/2017  . S/P CABG (coronary artery bypass graft)   . Pure hypercholesterolemia   . Acute combined systolic and diastolic heart failure (HCC)   . Exertional shortness of breath   . Solitary pulmonary nodule   . CAD (coronary artery disease) 02/24/2017  . ILD (interstitial lung disease) (HCC) 02/24/2017  . Normocytic anemia 02/24/2017  . AAA (abdominal aortic aneurysm) (HCC) 02/24/2017  . Abnormal EKG 02/24/2017  . Acute respiratory failure with hypoxia (HCC) 02/24/2017  . History of stroke 02/24/2017  . Essential hypertension 02/24/2017  . Current smoker 02/24/2017    Past Surgical History:  Procedure Laterality Date  . CARDIAC CATHETERIZATION    . CAROTID ENDARTERECTOMY Left   . Heart Bypass  10/1992  . LEFT HEART CATH AND CORS/GRAFTS ANGIOGRAPHY N/A 03/01/2017   Procedure: Left Heart Cath and Cors/Grafts Angiography;  Surgeon: Lyn Records, MD;  Location: [facility] INVASIVE CV LAB;  Service: Cardiovascular;  Laterality: N/A;  . Throat Biopsy     Cat scratch fever        Home Medications    Prior to Admission medications   Medication Sig Start Date End Date Taking? Authorizing Provider  acetaminophen (TYLENOL) 500 MG tablet Take 1,000 mg by mouth every 6 (six) hours as needed for mild pain.     [provider]  albuterol (ACCUNEB) 0.63  MG/3ML nebulizer solution Take 3 mLs by nebulization every 6 (six) hours as needed for wheezing. 10/15/17   [provider]  aspirin 81 MG chewable tablet Chew 1 tablet (81 mg total) by mouth daily. 03/03/17   Leroy Sea, MD  atorvastatin (LIPITOR) 40 MG tablet Take 1 tablet (40 mg total) by mouth daily at 6 PM. 03/15/17   Dunn, Tacey Ruiz, PA-C  carvedilol (COREG) 25 MG tablet Take 1 tablet (25 mg total) by mouth 2 (two) times daily with a meal. 03/08/18 06/06/18  Lars Masson, MD  furosemide (LASIX) 20 MG tablet Take 1 tablet (20 mg total) by mouth daily as needed for fluid or edema. 02/07/18   Lars Masson, MD  gabapentin (NEURONTIN) 300 MG capsule Take 1 capsule (300 mg total) by mouth every 8 (eight) hours. 01/18/18   Edward Jolly, MD  HYDROcodone-acetaminophen (NORCO) 10-325 MG tablet Take 1 tablet by mouth 3 (three) times daily as needed for severe pain. 03/29/18   Edward Jolly, MD  hydroxychloroquine (PLAQUENIL) 200 MG tablet Take 200 mg by mouth 2 (two) times daily. 10/24/17   [provider]  Menthol-Methyl Salicylate (MUSCLE RUB EX) Apply 1 application topically as needed (for shoulder or knee pain).     [provider]  OXYGEN Inhale 2 L into the lungs at bedtime.    [provider]  predniSONE (DELTASONE) 5 MG tablet Take 5mg  twice daily. 05/04/17   [provider]  PROVENTIL HFA 108 (90 Base) MCG/ACT inhaler INHALE 2 PUFFS EVERY 6 HOURS AS NEEDED FOR WHEEZE/SHORTNESS OF BREATH 03/29/18   03/31/18, MD  sacubitril-valsartan (ENTRESTO) 49-51 MG Take 1 tablet by mouth 2 (two) times daily. 01/14/18   01/16/18, MD  spironolactone (ALDACTONE) 25 MG tablet Take 1 tablet (25 mg total) by mouth daily. 11/08/17   01/06/18, MD    Family History Family History  Problem Relation Age of Onset  . Heart murmur Mother   . Heart disease Mother   . Heart attack Mother   . Suicidality Father   . Heart attack Father   . Stroke  Brother   . Heart attack Brother   . Heart attack Maternal Grandmother   . Cancer Maternal Grandmother        unknown type  . Lung cancer Maternal Aunt   . Lung cancer Maternal Uncle   . Cancer Maternal Aunt        unknown cancer  . Heart attack Brother     Social History Social History   Tobacco Use  . Smoking status: Current Every Day Smoker    Packs/day: 0.50    Years: 47.00    Pack years: 23.50    Types: Cigarettes  . Smokeless  tobacco: Never Used  . Tobacco comment: half pack every other day  Substance Use Topics  . Alcohol use: No    Comment: remote history of beer drinking  . Drug use: No     Allergies   Patient has no known allergies.   Review of Systems Review of Systems 10 Systems reviewed and are negative for acute change except as noted in the HPI.   Physical Exam Updated Vital Signs BP 109/85   Pulse 86   Temp (S) 99.1 F (37.3 C) (Rectal)   Resp (!) 29   Ht (S) 6\' 1"  (1.854 m)   Wt (S) 93 kg (205 lb)   SpO2 100%   BMI 27.05 kg/m   Physical Exam  Constitutional:  Patient is awake and interactive.  No respiratory distress at rest.  He does appear deconditioned and has fairly evident left-sided facial droop.  HENT:  Head: Normocephalic and atraumatic.  Mucous membranes are very dry.  Posterior oropharynx is widely patent.  Eyes: EOM are normal.  Cardiovascular: Normal rate, regular rhythm, normal heart sounds and intact distal pulses.  Pulmonary/Chest: Effort normal and breath sounds normal.  Abdominal:  Abdomen is soft.  Patient does not endorse pain to deep palpation.  Genitourinary:  Genitourinary Comments: Examination of buttocks during rectal exam shows large plaque of well demarcated erythema that has 2 contiguous regions.  This is larger to the right side.  One large plaque is approximately 15 x 10 cm and then a second island of about 10 x 10 cm.  The more medial to the gluteal cleft has a slightly ecchymotic and erythematous  appearance.  The slightly more lateral lesion appears to have a thin overlying blister.  The rectal area is normal.  This no stool in the vault trace yellowish material.  No melena or blood.  Musculoskeletal:  Patient has generalized weakness but left greater than right.  He can move each of the extremities at will.  Grip strength on the left is approximately 3 out of 5, right 5\5.  Patient has pronounced left facial droop.  Positive movement bilateral lower extremities  Skin: Skin is warm and dry. There is pallor.  Psychiatric: He has a normal mood and affect.  Patient is cooperative and pleasant.     ED Treatments / Results  Labs (all labs ordered are listed, but only abnormal results are displayed) Labs Reviewed  CBC WITH DIFFERENTIAL/PLATELET - Abnormal; Notable for the following components:      Result Value   WBC 14.6 (*)    RBC 3.69 (*)    Hemoglobin 10.9 (*)    HCT 33.1 (*)    Neutro Abs 12.8 (*)    All other components within normal limits  I-STAT TROPONIN, ED - Abnormal; Notable for the following components:   Troponin i, poc 0.63 (*)    All other components within normal limits  I-STAT CG4 LACTIC ACID, ED - Abnormal; Notable for the following components:   Lactic Acid, Venous 2.27 (*)    All other components within normal limits  I-STAT CHEM 8, ED - Abnormal; Notable for the following components:   Sodium 131 (*)    Potassium 5.6 (*)    Chloride 100 (*)    BUN 85 (*)    Creatinine, Ser 7.80 (*)    Calcium, Ion 0.99 (*)    TCO2 19 (*)    Hemoglobin 10.9 (*)    HCT 32.0 (*)    All other components within normal limits  CULTURE, BLOOD (ROUTINE X 2)  CULTURE, BLOOD (ROUTINE X 2)  PROTIME-INR  COMPREHENSIVE METABOLIC PANEL  BRAIN NATRIURETIC PEPTIDE  URINALYSIS, ROUTINE W REFLEX MICROSCOPIC  POC OCCULT BLOOD, ED    EKG EKG Interpretation  Date/Time:  Monday April 04 2018 14:53:16 EDT Ventricular Rate:  93 PR Interval:    QRS Duration: 129 QT  Interval:  383 QTC Calculation: 477 R Axis:   10 Text Interpretation:  Sinus rhythm IVCD, consider atypical LBBB no sig change from previous Confirmed by Arby Barrette [phone]) on 04/04/2018 4:43:41 PM   Radiology No results found.  Procedures Procedures (including critical care time) CRITICAL CARE Performed by: Cristy Friedlander   Total critical care time:30  minutes  Critical care time was exclusive of separately billable procedures and treating other patients.  Critical care was necessary to treat or prevent imminent or life-threatening deterioration.  Critical care was time spent personally by me on the following activities: development of treatment plan with patient and/or surrogate as well as nursing, discussions with consultants, evaluation of patient's response to treatment, examination of patient, obtaining history from patient or surrogate, ordering and performing treatments and interventions, ordering and review of laboratory studies, ordering and review of radiographic studies, pulse oximetry and re-evaluation of patient's condition. Medications Ordered in ED Medications  sodium chloride 0.9 % bolus 1,000 mL (has no administration in time range)    And  sodium chloride 0.9 % bolus 1,000 mL (has no administration in time range)  piperacillin-tazobactam (ZOSYN) IVPB 3.375 g (has no administration in time range)  vancomycin (VANCOCIN) IVPB 1000 mg/200 mL premix (has no administration in time range)  piperacillin-tazobactam (ZOSYN) IVPB 2.25 g (has no administration in time range)  vancomycin (VANCOCIN) IVPB 1000 mg/200 mL premix (has no administration in time range)  sodium chloride 0.9 % bolus 1,000 mL (1,000 mLs Intravenous New Bag/Given 04/04/18 1608)     Initial Impression / Assessment and Plan / ED Course  I have reviewed the triage vital signs and the nursing notes.  Pertinent labs & imaging results that were available during my care of the patient were reviewed by me  and considered in my medical decision making (see chart for details).      Final Clinical Impressions(s) / ED Diagnoses   Final diagnoses:  Sepsis, due to unspecified organism (HCC)  Pressure injury of skin of sacral region, unspecified injury stage  Severe comorbid illness  Acute renal failure, unspecified acute renal failure type [facility])   Patient presents with generalized weakness per report.  His sister reports that he just was too weak to get up and get out of his recliner.  Initial blood pressures were documented as normal but patient has hypotension in 70s/ 50s, shortly after arrival.  No fever at this time (rectal temperature 99.1).  He however has source of likely infection with a large pressure wound developing on his buttocks.  Sepsis protocol initiated.  There is also question of possibly new or acute stroke.  Patient already had left-sided deficit but this may be worse than baseline.  Objectively there is facial droop on the left that his sister reports is more than previous.  We will also need to proceed with stroke work-up as well as sepsis work-up.  Patient has no pain complaints.  Chemistry panel identifies acute renal failure.  This will require further evaluation for etiology.  Bladder obstruction, dehydration are within differential.  Dr. Frederick Peers will complete evaluation with review of diagnostic results and additional agnostic tests  if indicated with plan for admission. ED Discharge Orders    None       Arby Barrette, MD 04/04/18 1701

## 2018-04-05 ENCOUNTER — Inpatient Hospital Stay (HOSPITAL_COMMUNITY): Payer: Medicaid Other

## 2018-04-05 DIAGNOSIS — L98429 Non-pressure chronic ulcer of back with unspecified severity: Secondary | ICD-10-CM | POA: Diagnosis present

## 2018-04-05 DIAGNOSIS — E872 Acidosis: Secondary | ICD-10-CM

## 2018-04-05 DIAGNOSIS — N179 Acute kidney failure, unspecified: Secondary | ICD-10-CM | POA: Insufficient documentation

## 2018-04-05 DIAGNOSIS — I361 Nonrheumatic tricuspid (valve) insufficiency: Secondary | ICD-10-CM

## 2018-04-05 DIAGNOSIS — J849 Interstitial pulmonary disease, unspecified: Secondary | ICD-10-CM

## 2018-04-05 DIAGNOSIS — I340 Nonrheumatic mitral (valve) insufficiency: Secondary | ICD-10-CM

## 2018-04-05 DIAGNOSIS — R579 Shock, unspecified: Secondary | ICD-10-CM

## 2018-04-05 DIAGNOSIS — L89159 Pressure ulcer of sacral region, unspecified stage: Secondary | ICD-10-CM

## 2018-04-05 DIAGNOSIS — R69 Illness, unspecified: Secondary | ICD-10-CM | POA: Insufficient documentation

## 2018-04-05 LAB — BASIC METABOLIC PANEL
ANION GAP: 10 (ref 5–15)
Anion gap: 15 (ref 5–15)
BUN: 62 mg/dL — ABNORMAL HIGH (ref 6–20)
BUN: 46 mg/dL — ABNORMAL HIGH (ref 6–20)
CALCIUM: 7.8 mg/dL — AB (ref 8.9–10.3)
CHLORIDE: 107 mmol/L (ref 101–111)
CO2: 17 mmol/L — AB (ref 22–32)
CO2: 21 mmol/L — ABNORMAL LOW (ref 22–32)
CREATININE: 4.9 mg/dL — AB (ref 0.61–1.24)
Calcium: 7.4 mg/dL — ABNORMAL LOW (ref 8.9–10.3)
Chloride: 107 mmol/L (ref 101–111)
Creatinine, Ser: 2.37 mg/dL — ABNORMAL HIGH (ref 0.61–1.24)
GFR calc non Af Amer: 12 mL/min — ABNORMAL LOW (ref >60)
GFR, EST AFRICAN AMERICAN: 13 mL/min — AB (ref >60)
GFR, EST AFRICAN AMERICAN: 32 mL/min — AB (ref >60)
GFR, EST NON AFRICAN AMERICAN: 28 mL/min — AB (ref >60)
GLUCOSE: 119 mg/dL — AB (ref 65–99)
Glucose, Bld: 100 mg/dL — ABNORMAL HIGH (ref 65–99)
Potassium: 4.8 mmol/L (ref 3.5–5.1)
Potassium: 4.3 mmol/L (ref 3.5–5.1)
SODIUM: 138 mmol/L (ref 135–145)
Sodium: 139 mmol/L (ref 135–145)

## 2018-04-05 LAB — PHOSPHORUS: PHOSPHORUS: 5 mg/dL — AB (ref 2.5–4.6)

## 2018-04-05 LAB — PROCALCITONIN
PROCALCITONIN: 1.69 ng/mL
Procalcitonin: 2.37 ng/mL

## 2018-04-05 LAB — CBC
HEMATOCRIT: 30.3 % — AB (ref 39.0–52.0)
HEMOGLOBIN: 10 g/dL — AB (ref 13.0–17.0)
MCH: 29.2 pg (ref 26.0–34.0)
MCHC: 33 g/dL (ref 30.0–36.0)
MCV: 88.3 fL (ref 78.0–100.0)
Platelets: 171 10*3/uL (ref 150–400)
RBC: 3.43 MIL/uL — ABNORMAL LOW (ref 4.22–5.81)
RDW: 15.1 % (ref 11.5–15.5)
WBC: 13.7 10*3/uL — ABNORMAL HIGH (ref 4.0–10.5)

## 2018-04-05 LAB — ECHOCARDIOGRAM COMPLETE
Height: 73 in
WEIGHTICAEL: 3559.11 [oz_av]

## 2018-04-05 LAB — GLUCOSE, CAPILLARY
Glucose-Capillary: 95 mg/dL (ref 65–99)
Glucose-Capillary: 139 mg/dL — ABNORMAL HIGH (ref 65–99)
Glucose-Capillary: 121 mg/dL — ABNORMAL HIGH (ref 65–99)

## 2018-04-05 LAB — POCT I-STAT 3, ART BLOOD GAS (G3+)
Acid-base deficit: 8 mmol/L — ABNORMAL HIGH (ref 0.0–2.0)
Bicarbonate: 18.1 mmol/L — ABNORMAL LOW (ref 20.0–28.0)
O2 Saturation: 98 %
PH ART: 7.305 — AB (ref 7.350–7.450)
TCO2: 19 mmol/L — AB (ref 22–32)
pCO2 arterial: 36.3 mmHg (ref 32.0–48.0)
pO2, Arterial: 104 mmHg (ref 83.0–108.0)

## 2018-04-05 LAB — MRSA PCR SCREENING: MRSA BY PCR: NEGATIVE

## 2018-04-05 LAB — TROPONIN I
Troponin I: 0.28 ng/mL (ref <0.03)
Troponin I: 0.86 ng/mL (ref <0.03)

## 2018-04-05 LAB — BLOOD GAS, ARTERIAL
Acid-base deficit: 11.1 mmol/L — ABNORMAL HIGH (ref 0.0–2.0)
Bicarbonate: 14.8 mmol/L — ABNORMAL LOW (ref 20.0–28.0)
DRAWN BY: 511471
O2 Content: 2 L/min
O2 Saturation: 97.9 %
PATIENT TEMPERATURE: 98.9
pCO2 arterial: 35.6 mmHg (ref 32.0–48.0)
pH, Arterial: 7.243 — ABNORMAL LOW (ref 7.350–7.450)
pO2, Arterial: 115 mmHg — ABNORMAL HIGH (ref 83.0–108.0)

## 2018-04-05 LAB — BRAIN NATRIURETIC PEPTIDE: B NATRIURETIC PEPTIDE 5: 165.9 pg/mL — AB (ref 0.0–100.0)

## 2018-04-05 LAB — MAGNESIUM: MAGNESIUM: 1.9 mg/dL (ref 1.7–2.4)

## 2018-04-05 LAB — CORTISOL: CORTISOL PLASMA: 47.8 ug/dL

## 2018-04-05 MED ORDER — SODIUM BICARBONATE 8.4 % IV SOLN
INTRAVENOUS | Status: AC
  Filled 2018-04-05: qty 50

## 2018-04-05 MED ORDER — SODIUM BICARBONATE 8.4 % IV SOLN
50.0000 meq | Freq: Once | INTRAVENOUS | Status: AC
  Administered 2018-04-05: 50 meq via INTRAVENOUS

## 2018-04-05 NOTE — Procedures (Signed)
Arterial Catheter Insertion Procedure Note BRYAM TABORDA [id number] 1956/05/21  Procedure: Insertion of Arterial Catheter  Indications: Blood pressure monitoring and Frequent blood sampling  Procedure Details Consent: Risks of procedure as well as the alternatives and risks of each were explained to the (patient/caregiver).  Consent for procedure obtained. Time Out: Verified patient identification, verified procedure, site/side was marked, verified correct patient position, special equipment/implants available, medications/allergies/relevent history reviewed, required imaging and test results available.  Performed  Maximum sterile technique was used including antiseptics, cap, gloves, hand hygiene, mask and sheet. Skin prep: Chlorhexidine; local anesthetic administered 20 gauge catheter was inserted into left radial artery using the Seldinger technique. ULTRASOUND GUIDANCE USED: YES Evaluation Blood flow good; BP tracing good. Complications: No apparent complications.   Marcelle Smiling 04/05/2018

## 2018-04-05 NOTE — Progress Notes (Signed)
Dr. Ardeth Perfect in to assess pt and ordering an additional amp of Bicarb at this time.  Will continue to monitor.

## 2018-04-05 NOTE — Progress Notes (Signed)
PULMONARY / CRITICAL CARE MEDICINE   Name: [name] MRN: [id number] DOB: October 23, 1956   ADMISSION DATE:  04/04/2018 CONSULTATION DATE:  04/04/18  REFERRING MD:  Clarene Duke  CHIEF COMPLAINT:  Weakness  HISTORY OF PRESENT ILLNESS:  [name] is a 62 y.o. 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 [name] and on chronic prednisone), CVA with residual left sided weakness, MI.  He presented to [facility] ED 6/3 due to generalized weakness.  Per sister, he was normal the day prior and when he woke up 6/3, he had profound weakness.  At baseline, pt is fairly sedentary.  He lives with sister who is primary caregiver.  Sister states he can perform most ADLs independently but does occasionally need help.   On day of presentation, pt could barely get out of bed and / or into his recliner and due to fear of him falling while trying, sister called EMS.   In ED, he was found to have hypotension (SBP in 70's), hyponatremia, hyperkalemia, AKI (SCr up to 8).  He remained hypotensive despite 4L IVF and was subsequently started on norepinephrine.  CT of the head was negative.  He had bladder scan that showed ~500cc urine.  After foley was placed, he has had roughly 900cc UOP since arrival in ED about 7 hours ago.  He denies any recent fevers/chills/sweats, headaches, chest pain, SOB, N/V/D, abd pain, myalgias, exposure to known sick contacts.  He has had dry cough for 3 - 4 days which has not changed.  He has been taking all of his medications as prescribed including anti-hypertensives.  He has been eating OK and drinking fairly well but per sister, has not been drinking as much water as he should (though does drink lots of tea and soda).  SUBJECTIVE:  Improving mental status this morning Pressors have been weaned to off Sodium bicarbonate remains at 100 cc/h Good urine output, 1.8 L last 24  hours  VITAL SIGNS: BP 113/77   Pulse 88   Temp 98.1 F (36.7 C) (Oral)   Resp (!) 28   Ht (S) 6\' 1"  (1.854 m)   Wt 100.9 kg (222 lb 7.1 oz)   SpO2 99%   BMI 29.35 kg/m   HEMODYNAMICS:    VENTILATOR SETTINGS:    INTAKE / OUTPUT: I/O last 3 completed shifts: In: 7780 [I.V.:2730; IV Piggyback:5050] Out: 1885 [Urine:1885]   PHYSICAL EXAMINATION: General: Chronically ill-appearing elderly gentleman, resting comfortably Neuro: Awake, alert, very mild right facial droop.  Interacts appropriately and answers questions appropriately.  Intact swallow.  Difficulty raising both legs off the bed at the hips due to weakness, good strength elsewhere.  Sensation intact in the legs HEENT: Dry oropharynx, sclera anicteric Cardiovascular: Regular, borderline tachycardic, no murmurs Lungs: Comfortable respiratory pattern, few bibasilar inspiratory crackles Abdomen: Soft, nontender, benign Musculoskeletal: No significant lower extremity edema Skin: No apparent rash, RN reports some pressure injury on his sacrum, evolving blister probably stage II    LABS:  BMET Recent Labs  Lab 04/04/18 1505 04/04/18 1617 04/05/18 0442  NA 133* 131* 139  K 5.6* 5.6* 4.8  CL 98* 100* 107  CO2 18*  --  17*  BUN 78* 85* 62*  CREATININE 8.19* 7.80* 4.90*  GLUCOSE 76 71 100*    Electrolytes Recent Labs  Lab 04/04/18 1505 04/05/18 0442  CALCIUM 8.0*  7.4*  MG  --  1.9  PHOS  --  5.0*    CBC Recent Labs  Lab 04/04/18 1505 04/04/18 1617 04/05/18 0442  WBC 14.6*  --  13.7*  HGB 10.9* 10.9* 10.0*  HCT 33.1* 32.0* 30.3*  PLT 192  --  171    Coag's Recent Labs  Lab 04/04/18 1505  INR 1.05    Sepsis Markers Recent Labs  Lab 04/04/18 1617 04/04/18 1756 04/05/18 0111 04/05/18 0442  LATICACIDVEN 2.27* 1.02  --   --   PROCALCITON  --   --  1.69 2.37    ABG Recent Labs  Lab 04/04/18 2345 04/05/18 0300 04/05/18 0903  PHART 7.197* 7.243* 7.305*  PCO2ART 31.7* 35.6 36.3   PO2ART 89.0 115* 104.0    Liver Enzymes Recent Labs  Lab 04/04/18 1505  AST 92*  ALT 34  ALKPHOS 76  BILITOT 1.1  ALBUMIN 2.7*    Cardiac Enzymes Recent Labs  Lab 04/05/18 0111 04/05/18 0442  TROPONINI 0.28* 0.86*    Glucose No results for input(s): GLUCAP in the last 168 hours.  Imaging Ct Head Wo Contrast  Result Date: 04/04/2018 CLINICAL DATA:  Bilateral leg weakness and slurred speech beginning at 4 a.m. today. Previous stroke and myocardial infarction EXAM: CT HEAD WITHOUT CONTRAST TECHNIQUE: Contiguous axial images were obtained from the base of the skull through the vertex without intravenous contrast. COMPARISON:  Head CT report dated 02/03/2002. FINDINGS: Brain: Diffusely enlarged ventricles and subarachnoid spaces. Patchy white matter low density in both cerebral hemispheres. 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 No intracranial hemorrhage, mass lesion or CT evidence of acute infarction. Vascular: No hyperdense vessel or unexpected calcification. Skull: There is some motion degradation at the skull base. No fractures are seen. Sinuses/Orbits: Unremarkable. Other: None. IMPRESSION: 1. No acute abnormality. 2. Mild diffuse cerebral and cerebellar atrophy. 3. 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 Electronically Signed   By: Beckie Salts M.D.   On: 04/04/2018 17:10   Dg Chest Port 1 View  Result Date: 04/04/2018 CLINICAL DATA:  Bilateral leg weakness, slurred speech EXAM: PORTABLE CHEST 1 VIEW COMPARISON:  11/01/2017 FINDINGS: Subpleural reticulation/fibrosis in the lungs bilaterally, suggesting chronic interstitial lung disease. No focal consolidation. There effusion Cardiomegaly.  Postsurgical changes related to prior CABG. Median sternotomy with stable fractured upper sternal wires. Old left posterior rib fractures. IMPRESSION: No evidence of acute cardiopulmonary disease. Stable chronic interstitial lung disease.  Electronically Signed   By: Charline Bills M.D.   On: 04/04/2018 17:29     STUDIES:  CT head 6/3 > no acute process. CXR 6/3 > no acute process. Echo 6/4 >   CULTURES: Blood 6/3 >  Urine 6/3 >   ANTIBIOTICS: Vanc 6/3 >  Zosyn 6/3 >   SIGNIFICANT EVENTS: 6/3 > admit.  LINES/TUBES Art line L rad 6/3 >   DISCUSSION: 62 y.o. male admitted 6/3 with hypotension, generalized weakness, AKI. Hypotension felt to be primarily hypovolemic + probable component adrenal insufficiency (on chronic steroids).  No clear source of infection at this point.  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 [name]). Chronic hypoxic respiratory failure - on 2L O2 nocturnally at baseline. Tobacco dependence. P:   On chronic prednisone, Plaquenil for his rheumatoid associated interstitial lung disease, currently on stress dose steroids Wean oxygen as needed to maintain SPO2 greater than 90% Continues to smoke, will need smoking cessation counseling going  forward.  Not on home bronchodilators  CARDIOVASCULAR A:  Shock - felt to be primarily hypovolemic + possible component adrenal insufficiency.  No clear evidence of infectious component at this time, although procalcitonin elevated.  Lactate reassuring.  A repeat sepsis assessment has been performed. Hx NSVT, MI, HTN, CAD, CHF (echo from April 2018 with EF 35-40%, G1DD), AAA. Positive troponin, suspect stress non-STEMI compounded by renal failure P:  Norepinephrine weaned to off Continue arterial line monitoring until we confirm stability, then we will discontinue Central venous catheter deferred Continue empiric stress dose steroids, likely transition back to his normal prednisone once we confirm stability Aggressive IV fluids Home carvedilol, furosemide, losartan, Entresto, spironolactone are all on hold. Follow troponin for peak Agree with echocardiogram, currently pending  RENAL A:    Hyponatremia.  Improving Mild hyperkalemia.  Resolved AKI - presumed pre-renal from hypovolemia.  Exacerbated by furosemide, spironolactone, entresto.  Hypocalcemia. P:   Normal saline transition to sodium bicarbonate overnight, currently 100 cc/h Given improvement with volume resuscitation, good urine output suspect that we can defer renal ultrasound at this time No indication to consult nephrology unless his improvement stalls Follow urine output and BMP closely  GASTROINTESTINAL A:   Hx GERD. Nutrition. P:   Okay to restart a heart healthy diet  HEMATOLOGIC A:   VTE Prophylaxis. P:  SCD's / heparin. CBC in AM.  INFECTIOUS A:   Shock -consider possible component of sepsis, most suspicious source would be sacral skin wound.  Procalcitonin is positive, unclear significance in the setting of his renal failure P:   Continue current vancomycin, Zosyn, low threshold to discontinue next 24 hours depending on clinical improvement and any evidence for active infection. Wound care consult as ordered Follow procalcitonin Follow culture data  ENDOCRINE A:   ?  Relative adrenal insufficiency - on chronic steroids.   P:   Cortisol lab work unhelpful since performed after his hydrocortisone was administered Continue stress dose hydrocortisone for now, plan transition back to his home prednisone once we confirm stability.  NEUROLOGIC A:   Generalized weakness - exacerbated by hypotension / shock.  Reports that he feels stronger but continues to have difficulty lifting both legs off the bed.  Still unclear that there is a focal deficit here.  (He has left sided weakness chronically per he and his sister). Possible early dementia, his mental status is improved on 6/4 with volume resuscitation.  Suspect that much of this was due to his renal failure P:   Avoid sedating medications if possible Defer neurology consult at this time given his improvement Holding his preadmission  gabapentin and Norco, restart when he is stabilizing.  Family updated: Sister updated at bedside 6/3 pm  Interdisciplinary Family Meeting v Palliative Care Meeting:  Due by: 04/10/18.  Independent CC time : 34 minutes   Levy Pupa, MD, PhD 04/05/2018, 9:42 AM [facility] [phone] or if no answer [phone]

## 2018-04-05 NOTE — Progress Notes (Signed)
  Echocardiogram 2D Echocardiogram has been performed.  [name] 04/05/2018, 10:11 AM

## 2018-04-06 ENCOUNTER — Ambulatory Visit: Payer: Medicaid Other

## 2018-04-06 ENCOUNTER — Inpatient Hospital Stay (HOSPITAL_COMMUNITY): Payer: Medicaid Other

## 2018-04-06 LAB — CBC
HEMATOCRIT: 32.6 % — AB (ref 39.0–52.0)
Hemoglobin: 10.6 g/dL — ABNORMAL LOW (ref 13.0–17.0)
MCH: 29.4 pg (ref 26.0–34.0)
MCHC: 32.5 g/dL (ref 30.0–36.0)
MCV: 90.6 fL (ref 78.0–100.0)
Platelets: 184 10*3/uL (ref 150–400)
RBC: 3.6 MIL/uL — ABNORMAL LOW (ref 4.22–5.81)
RDW: 15.5 % (ref 11.5–15.5)
WBC: 9.6 10*3/uL (ref 4.0–10.5)

## 2018-04-06 LAB — BASIC METABOLIC PANEL
Anion gap: 9 (ref 5–15)
BUN: 35 mg/dL — ABNORMAL HIGH (ref 6–20)
CALCIUM: 8.2 mg/dL — AB (ref 8.9–10.3)
CO2: 25 mmol/L (ref 22–32)
CREATININE: 1.62 mg/dL — AB (ref 0.61–1.24)
Chloride: 108 mmol/L (ref 101–111)
GFR, EST AFRICAN AMERICAN: 51 mL/min — AB (ref >60)
GFR, EST NON AFRICAN AMERICAN: 44 mL/min — AB (ref >60)
GLUCOSE: 119 mg/dL — AB (ref 65–99)
Potassium: 4.3 mmol/L (ref 3.5–5.1)
Sodium: 142 mmol/L (ref 135–145)

## 2018-04-06 LAB — MAGNESIUM: Magnesium: 2.2 mg/dL (ref 1.7–2.4)

## 2018-04-06 LAB — URINE CULTURE: Culture: NO GROWTH

## 2018-04-06 LAB — PROCALCITONIN: PROCALCITONIN: 0.7 ng/mL

## 2018-04-06 LAB — PHOSPHORUS: Phosphorus: 2.4 mg/dL — ABNORMAL LOW (ref 2.5–4.6)

## 2018-04-06 MED ORDER — VANCOMYCIN HCL IN DEXTROSE 750-5 MG/150ML-% IV SOLN
750.0000 mg | Freq: Two times a day (BID) | INTRAVENOUS | Status: DC
  Administered 2018-04-06: 750 mg via INTRAVENOUS
  Filled 2018-04-06: qty 150

## 2018-04-06 MED ORDER — NICOTINE 7 MG/24HR TD PT24
7.0000 mg | MEDICATED_PATCH | Freq: Every day | TRANSDERMAL | Status: DC
  Administered 2018-04-06 – 2018-04-12 (×7): 7 mg via TRANSDERMAL
  Filled 2018-04-06 (×7): qty 1

## 2018-04-06 MED ORDER — PREDNISONE 5 MG PO TABS
10.0000 mg | ORAL_TABLET | Freq: Every day | ORAL | Status: DC
  Administered 2018-04-06 – 2018-04-12 (×7): 10 mg via ORAL
  Filled 2018-04-06: qty 1
  Filled 2018-04-06 (×2): qty 2
  Filled 2018-04-06 (×4): qty 1

## 2018-04-06 MED ORDER — PANTOPRAZOLE SODIUM 40 MG PO TBEC
40.0000 mg | DELAYED_RELEASE_TABLET | Freq: Every day | ORAL | Status: DC
  Administered 2018-04-06 – 2018-04-12 (×7): 40 mg via ORAL
  Filled 2018-04-06 (×7): qty 1

## 2018-04-06 MED ORDER — PIPERACILLIN-TAZOBACTAM 3.375 G IVPB
3.3750 g | Freq: Three times a day (TID) | INTRAVENOUS | Status: DC
  Filled 2018-04-06: qty 50

## 2018-04-06 MED ORDER — LACTATED RINGERS IV SOLN
INTRAVENOUS | Status: DC
  Administered 2018-04-06: 08:00:00 via INTRAVENOUS
  Administered 2018-04-07 – 2018-04-08 (×2): 50 mL/h via INTRAVENOUS
  Administered 2018-04-09 – 2018-04-11 (×3): via INTRAVENOUS

## 2018-04-06 NOTE — Progress Notes (Signed)
PULMONARY / CRITICAL CARE MEDICINE   Name: KOJI NIEHOFF MRN: [id number] DOB: 05/30/1956   ADMISSION DATE:  04/04/2018 CONSULTATION DATE:  04/04/18  REFERRING MD:  Clarene Duke  CHIEF COMPLAINT:  Weakness  HISTORY OF PRESENT ILLNESS:  [name] is a 62 y.o. 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 [name] and on chronic prednisone), CVA with residual left sided weakness, MI.  He presented to  [facility] ED 6/3 due to generalized weakness.  Per sister, he was normal the day prior and when he woke up 6/3, he had profound weakness.  At baseline, pt is fairly sedentary.  He lives with sister who is primary caregiver.  Sister states he can perform most ADLs independently but does occasionally need help.     In ED, he was found to have hypotension (SBP in 70's), hyponatremia, hyperkalemia, AKI (SCr up to 8).  He remained hypotensive despite 4L IVF and was subsequently started on norepinephrine.  CT of the head was negative.  He had bladder scan that showed ~500cc urine.  After foley was placed, he has had roughly 900cc UOP since arrival in ED about 7 hours ago.  SUBJECTIVE:  Still some delirium this morning.   VITAL SIGNS: BP (!) 140/93   Pulse 88   Temp 98 F (36.7 C) (Oral)   Resp (!) 29   Ht (S) 6\' 1"  (1.854 m)   Wt 100.9 kg (222 lb 7.1 oz)   SpO2 91%   BMI 29.35 kg/m   HEMODYNAMICS:    VENTILATOR SETTINGS:    INTAKE / OUTPUT: I/O last 3 completed shifts: In: 6181.7 [I.V.:4031.7; IV Piggyback:2150] Out: 4655 [Urine:4655]   PHYSICAL EXAMINATION:  General: Chronically ill-appearing elderly gentleman, resting comfortably Neuro: Awake, alert, oriented to self and year. Intermittent periods of agitation.  HEENT: Dry oropharynx. No JVD. PERRL.  Cardiovascular: RRR, no MRG Lungs: Comfortable respiratory pattern, clear Abdomen: Soft, nontender,  non-distended Musculoskeletal: No acute deformity. Some lower extremity weakness.  Skin: No apparent rash, RN reports some pressure injury on his sacrum, evolving blister probably stage II   LABS:  BMET Recent Labs  Lab 04/05/18 0442 04/05/18 1735 04/06/18 0445  NA 139 138 142  K 4.8 4.3 4.3  CL 107 107 108  CO2 17* 21* 25  BUN 62* 46* 35*  CREATININE 4.90* 2.37* 1.62*  GLUCOSE 100* 119* 119*    Electrolytes Recent Labs  Lab 04/05/18 0442 04/05/18 1735 04/06/18 0445  CALCIUM 7.4* 7.8* 8.2*  MG 1.9  --  2.2  PHOS 5.0*  --  2.4*    CBC Recent Labs  Lab 04/04/18 1505 04/04/18 1617 04/05/18 0442 04/06/18 0445  WBC 14.6*  --  13.7* 9.6  HGB 10.9* 10.9* 10.0* 10.6*  HCT 33.1* 32.0* 30.3* 32.6*  PLT 192  --  171 184    Coag's Recent Labs  Lab 04/04/18 1505  INR 1.05    Sepsis Markers Recent Labs  Lab 04/04/18 1617 04/04/18 1756 04/05/18 0111 04/05/18 0442 04/06/18 0445  LATICACIDVEN 2.27* 1.02  --   --   --   PROCALCITON  --   --  1.69 2.37 0.70    ABG Recent Labs  Lab 04/04/18 2345 04/05/18 0300 04/05/18 0903  PHART 7.197* 7.243* 7.305*  PCO2ART 31.7* 35.6 36.3  PO2ART 89.0 115* 104.0    Liver Enzymes Recent Labs  Lab 04/04/18 1505  AST 92*  ALT 34  ALKPHOS 76  BILITOT 1.1  ALBUMIN 2.7*    Cardiac Enzymes Recent Labs  Lab 04/05/18 0111 04/05/18 0442  TROPONINI 0.28* 0.86*    Glucose Recent Labs  Lab 04/05/18 0028 04/05/18 1117 04/05/18 1548  GLUCAP 95 139* 121*    Imaging No results found.   STUDIES:  CT head 6/3 > no acute process. CXR 6/3 > no acute process. Echo 6/4 > LVEF 30-35%, Grade 2 DD  CULTURES: Blood 6/3 >  Urine 6/3 >   ANTIBIOTICS: Vanc 6/3 > 6/5 Zosyn 6/3 > 6/5  SIGNIFICANT EVENTS: 6/3 > admit.  LINES/TUBES Art line L rad 6/3 >   DISCUSSION: 62 y.o. male admitted 6/3 with hypotension, generalized weakness, AKI. Hypotension felt to be primarily hypovolemic + probable component  adrenal insufficiency (on chronic steroids).  No clear source of infection at this point. He required pressors and sodium bicarbonate infusion. Improved with hydration and stress dose steroids. Course continued to be complicated by ICU delirium with slow improvement.   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 [name]). Chronic hypoxic respiratory failure - on 2L O2 nocturnally at baseline. Tobacco dependence. P:   On chronic prednisone, Plaquenil for his rheumatoid associated interstitial lung disease, Steroids decrease to home prednisone 10mg . Wean oxygen as needed to maintain SPO2 greater than 90% Continues to smoke, will need smoking cessation counseling going forward.  Not on home bronchodilators  CARDIOVASCULAR A:  Shock - felt to be primarily hypovolemic + possible component adrenal insufficiency.  No clear evidence of infectious component, although procalcitonin elevated. Positive troponin, suspect stress non-STEMI compounded by renal failure Chronic systolic/diastolic CHF Echo 6/4 > LVEF 30-35%, Grade 2 DD Hx NSVT, MI, HTN, CAD, AAA. P:  Transition back to home steroids.  Transition IVF to LR, then cut back as his PO intake should increase today.  Home carvedilol, furosemide, losartan, Entresto, spironolactone are all on hold. Repeat troponin to ensure clearing.   RENAL A:   Hyponatremia.  Improving Mild hyperkalemia.  Resolved Metabolic acidosis > improved AKI - presumed pre-renal from hypovolemia.  Exacerbated by furosemide, spironolactone, entresto. Hypocalcemia. P:   IVF to LR No indication to consult nephrology unless his improvement stalls Follow urine output and BMP closely  GASTROINTESTINAL A:   Hx GERD. Nutrition. P:   Heart healthy diet PO pepcid  HEMATOLOGIC A:   VTE Prophylaxis. P:  SCD's / heparin. CBC in AM.  INFECTIOUS A:   Shock -consider possible component of sepsis, most suspicious source  would be sacral skin wound.  Procalcitonin is positive, unclear significance in the setting of his renal failure P:   DC zosyn and vancomycin Wound care consult as ordered Follow culture data  ENDOCRINE A:   ?  Relative adrenal insufficiency - on chronic steroids.   P:   Resume home steroid dosing.   NEUROLOGIC A:   Generalized weakness - exacerbated by hypotension / shock.  Reports that he feels stronger but continues to have difficulty lifting both legs off the bed.  Still unclear that there is a focal deficit here.  (He has left sided weakness chronically per he and his sister). Possible early dementia, his mental status is improved on 6/4 with volume resuscitation.  Suspect that much of this was due to his renal failure ICU Delirium P:   Establish good sleep/wake routine. May need to start Seroquel if doesn't improve today.  Holding his preadmission gabapentin and Norco, restart when he is stabilizing.  Family updated: No family available at this time. Sister not here yet today. Will update when she arrives.   Interdisciplinary Family Meeting v Palliative Care Meeting:  Due by: 04/10/18.  Independent CC time : 34 minutes   Will plan to transfer patient to telemetry and TRH for 6/6.  Joneen Roach, AGACNP-BC [facility] Pulmonology/Critical Care Pager [phone] or [phone]  04/06/2018 7:40 AM

## 2018-04-07 ENCOUNTER — Other Ambulatory Visit: Payer: Self-pay

## 2018-04-07 DIAGNOSIS — L89312 Pressure ulcer of right buttock, stage 2: Secondary | ICD-10-CM

## 2018-04-07 DIAGNOSIS — R404 Transient alteration of awareness: Secondary | ICD-10-CM

## 2018-04-07 DIAGNOSIS — I10 Essential (primary) hypertension: Secondary | ICD-10-CM

## 2018-04-07 DIAGNOSIS — M05711 Rheumatoid arthritis with rheumatoid factor of right shoulder without organ or systems involvement: Secondary | ICD-10-CM

## 2018-04-07 DIAGNOSIS — R748 Abnormal levels of other serum enzymes: Secondary | ICD-10-CM

## 2018-04-07 DIAGNOSIS — M05712 Rheumatoid arthritis with rheumatoid factor of left shoulder without organ or systems involvement: Secondary | ICD-10-CM

## 2018-04-07 DIAGNOSIS — I248 Other forms of acute ischemic heart disease: Secondary | ICD-10-CM

## 2018-04-07 DIAGNOSIS — J9611 Chronic respiratory failure with hypoxia: Secondary | ICD-10-CM

## 2018-04-07 DIAGNOSIS — E2740 Unspecified adrenocortical insufficiency: Principal | ICD-10-CM

## 2018-04-07 LAB — COMPREHENSIVE METABOLIC PANEL WITH GFR
ALT: 45 U/L (ref 17–63)
AST: 67 U/L — ABNORMAL HIGH (ref 15–41)
Albumin: 2.3 g/dL — ABNORMAL LOW (ref 3.5–5.0)
Alkaline Phosphatase: 64 U/L (ref 38–126)
Anion gap: 9 (ref 5–15)
BUN: 24 mg/dL — ABNORMAL HIGH (ref 6–20)
CO2: 25 mmol/L (ref 22–32)
Calcium: 8.6 mg/dL — ABNORMAL LOW (ref 8.9–10.3)
Chloride: 111 mmol/L (ref 101–111)
Creatinine, Ser: 0.91 mg/dL (ref 0.61–1.24)
GFR calc Af Amer: >60 mL/min
GFR calc non Af Amer: >60 mL/min
Glucose, Bld: 119 mg/dL — ABNORMAL HIGH (ref 65–99)
Potassium: 3.9 mmol/L (ref 3.5–5.1)
Sodium: 145 mmol/L (ref 135–145)
Total Bilirubin: 0.6 mg/dL (ref 0.3–1.2)
Total Protein: 6.1 g/dL — ABNORMAL LOW (ref 6.5–8.1)

## 2018-04-07 LAB — CBC
HEMATOCRIT: 33.8 % — AB (ref 39.0–52.0)
Hemoglobin: 10.9 g/dL — ABNORMAL LOW (ref 13.0–17.0)
MCH: 28.8 pg (ref 26.0–34.0)
MCHC: 32.2 g/dL (ref 30.0–36.0)
MCV: 89.2 fL (ref 78.0–100.0)
Platelets: 212 10*3/uL (ref 150–400)
RBC: 3.79 MIL/uL — ABNORMAL LOW (ref 4.22–5.81)
RDW: 15.8 % — AB (ref 11.5–15.5)
WBC: 10.8 10*3/uL — ABNORMAL HIGH (ref 4.0–10.5)

## 2018-04-07 LAB — C DIFFICILE QUICK SCREEN W PCR REFLEX
C Diff antigen: NEGATIVE
C Diff interpretation: NOT DETECTED
C Diff toxin: NEGATIVE

## 2018-04-07 LAB — MAGNESIUM: MAGNESIUM: 2.1 mg/dL (ref 1.7–2.4)

## 2018-04-07 LAB — TROPONIN I: Troponin I: 0.48 ng/mL (ref <0.03)

## 2018-04-07 MED ORDER — ORAL CARE MOUTH RINSE
15.0000 mL | Freq: Two times a day (BID) | OROMUCOSAL | Status: DC
  Administered 2018-04-07 – 2018-04-12 (×8): 15 mL via OROMUCOSAL

## 2018-04-07 MED ORDER — CARVEDILOL 12.5 MG PO TABS
12.5000 mg | ORAL_TABLET | Freq: Two times a day (BID) | ORAL | Status: DC
  Administered 2018-04-07 – 2018-04-09 (×5): 12.5 mg via ORAL
  Filled 2018-04-07 (×7): qty 1

## 2018-04-07 MED ORDER — HYDROXYCHLOROQUINE SULFATE 200 MG PO TABS
200.0000 mg | ORAL_TABLET | Freq: Two times a day (BID) | ORAL | Status: DC
  Administered 2018-04-07 – 2018-04-12 (×11): 200 mg via ORAL
  Filled 2018-04-07 (×12): qty 1

## 2018-04-07 MED ORDER — ONDANSETRON HCL 4 MG/2ML IJ SOLN
4.0000 mg | Freq: Once | INTRAMUSCULAR | Status: AC
  Administered 2018-04-07: 4 mg via INTRAVENOUS

## 2018-04-07 MED ORDER — PNEUMOCOCCAL VAC POLYVALENT 25 MCG/0.5ML IJ INJ
0.5000 mL | INJECTION | INTRAMUSCULAR | Status: AC
  Administered 2018-04-09: 0.5 mL via INTRAMUSCULAR
  Filled 2018-04-07: qty 0.5

## 2018-04-07 MED ORDER — ONDANSETRON HCL 4 MG/2ML IJ SOLN
INTRAMUSCULAR | Status: AC
  Filled 2018-04-07: qty 2

## 2018-04-07 MED ORDER — PROMETHAZINE HCL 25 MG/ML IJ SOLN
25.0000 mg | Freq: Four times a day (QID) | INTRAMUSCULAR | Status: DC | PRN
  Administered 2018-04-07: 25 mg via INTRAVENOUS
  Filled 2018-04-07: qty 1

## 2018-04-07 MED ORDER — OXYCODONE HCL 5 MG PO TABS
5.0000 mg | ORAL_TABLET | Freq: Four times a day (QID) | ORAL | Status: DC | PRN
  Administered 2018-04-10: 5 mg via ORAL
  Administered 2018-04-10 – 2018-04-12 (×5): 10 mg via ORAL
  Filled 2018-04-07: qty 2
  Filled 2018-04-07: qty 1
  Filled 2018-04-07 (×4): qty 2

## 2018-04-07 MED ORDER — ONDANSETRON HCL 4 MG/2ML IJ SOLN: 4.0000 mg | Freq: Three times a day (TID) | INTRAMUSCULAR | Status: DC | PRN

## 2018-04-07 MED ORDER — IPRATROPIUM-ALBUTEROL 0.5-2.5 (3) MG/3ML IN SOLN
3.0000 mL | Freq: Three times a day (TID) | RESPIRATORY_TRACT | Status: DC
  Administered 2018-04-07 – 2018-04-12 (×16): 3 mL via RESPIRATORY_TRACT
  Filled 2018-04-07 (×17): qty 3

## 2018-04-07 NOTE — Progress Notes (Signed)
PT Cancellation Note  Patient Details Name: [name] MRN: [id number] DOB: 1956-04-13   Cancelled Treatment:    Reason Eval/Treat Not Completed: (P) Medical issues which prohibited therapy  Per RN currently experiencing nausea and diarrhea and was just given Phenergan. PT will follow back this afternoon as able.   Ryan Asbury B. Beverely Risen PT, DPT Acute Rehabilitation  [phone] Pager [phone]     Elon Alas Fleet 04/07/2018, 10:11 AM

## 2018-04-07 NOTE — Consult Note (Signed)
WOC Nurse wound consult note Reason for Consult: Buttocks DTI and full thickness, gluteal cleft unstageable, scrotum wounds Wound type: Pressure, MASD IAD Pressure Injury POA: Yes Measurement: Gluteal cleft and left buttock unstageable 11cm x 11cm x 0.1cm with 50% yellow/tan 50% DTI dark purple, scant drainage, no odor. Periwound has blanchable redness. Right buttock 10cm x 10.5cm x 0.1cm MASD IAD with three full thickness wounds, no odor Scrotum has multiple small open wounds on posterior side from IAD.wound beds covered in stool. Wound bed: see above Drainage (amount, consistency, odor) see above Periwound: see above Dressing procedure/placement/frequency: Patient is incontinent of liquid stool. Bedside RN, Meredith Staggers, states if continues with stooling will ask for a flexiseal. Patient arrived to hospital with intact blisters with DTI, noted on admission at [facility] 6/4. Sedimentary at home prior to admission. Lives with sister who gave history. I have provided nurses with orders for Purple Top [employer] Clear Barrier to buttocks and scrotum, and limit supine position, turn side to side, have ordered a low air loss mattress. One Derma Therapy pad to be used, do not use other bed pads, cloth or paper. Patient's nutritional level is poor, recommend protein supplements or Nutritional Consult for optimal wound healing, please order if you agree. WOC team will recheck patient tomorrow to assess progress and adjust wound care orders if needed. Patient may move out to telemetry level of care. Low air loss mattress to to moved with patient.  Barnett Hatter, RN-C, WTA-C, O[facility] Wound Treatment Associate Ostomy Care Associate

## 2018-04-07 NOTE — Progress Notes (Signed)
PROGRESS NOTE    [name]  JME:[id number] DOB: Nov 25, 1955 DOA: 04/04/2018 PCP: [name], PA-C   Brief Narrative:  [name] PMHx CVA, AAA ,CAD, Chronic combined systolic and diastolic CHF(April 2018 EF 35 to 40%),MI,  ILD, Lung nodule,  (followed by Dr. [name] and on chronic prednisone),Tobacco abuse, MI, NSVT, Normocytic anemia,Pancreatitis, RA (rheumatoid arthritis),  Presented to [facility] ED 6/3 due to generalized weakness.  Per sister, he was normal the day prior and when he woke up 6/3, he had profound weakness.  At baseline, pt is fairly sedentary.  He lives with sister who is primary caregiver.  Sister states he can perform most ADLs independently but does occasionally need help.   On day of presentation, pt could barely get out of bed and / or into his recliner and due to fear of him falling while trying, sister called EMS.   In ED, he was found to have hypotension (SBP in 70's), hyponatremia, hyperkalemia, AKI (SCr up to 8).  He remained hypotensive despite 4L IVF and was subsequently started on norepinephrine.  CT of the head was negative.  He had bladder scan that showed ~500cc urine.  After foley was placed, he has had roughly 900cc UOP since arrival in ED about 7 hours ago.   He denies any recent fevers/chills/sweats, headaches, chest pain, SOB, N/V/D, abd pain, myalgias, exposure to known sick contacts.  He has had dry cough for 3 - 4 days which has not changed.  He has been taking all of his medications as prescribed including anti-hypertensives.  He has been eating OK and drinking fairly well but per sister, has not been drinking as much water as he should (though does drink lots of tea and soda).    Subjective: 6/6 A/O x4, slurred speech (just had a dose of Benadryl) negative nausea, negative abdominal pain, negative CP, positive chronic S OB.  States he wears 2 L O2 when sleeping at home.  Continues to smoke cigarettes.   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 [name] PCCM - On chronic steroids.  Prednisone increased to 10 mg daily -Restart Plaquenil 200 mg BID -DuoNeb TID -Albuterol PRN   Chronic respiratory failure with hypoxia (home O2; 2 L O2 nocturnally) -Multifactorial to include ILD, COPD, continued tobacco abuse -Patient counseled on need to absolutely discontinue smoking. -Titrate O2 to maintain SPO2 89 to 93%  COPD/Tobacco abuse - Nicotine patch   Hypovolemic shock  -Felt to be secondary to hypovolemia + component of adrenal insufficiency   Chronic systolic and diastolic CHF - 6/4 Echocardiogram: LVEF= 30 to 35%, grade 2 diastolic dysfunction -BP increasing restart Coreg 12.5 mg BID (home dose 25 mg) -Strict in and out -Daily weight - Transfuse for hemoglobin<8  Elevated troponin/Demand ischemia? -Felt secondary to stress, NSTEMI + renal failure  Hx MI -See CHF  Essential HTN -See CHF  CAD -S/P CABG -See CHF  AAA  Hx NSVT   RIGHT Stage II buttocks ulcer - Wound care consult - Possible infection, negative leukocytosis, negative fever patient currently not on antibiotics would not start will monitor.  Diarrhea - Watery patient was on a total of 3 days of antibiotics possible C. difficile but VERY unlikely.  Will monitor for now. ADDENDUM: Continued watery foul-smelling diarrhea.  Send for C. difficile culture, stool culture. - Insert Flexi-Seal secondary to patient having stage II buttocks ulcers  Acute kidney injury Recent Labs  Lab 04/04/18 1505 04/04/18 1617 04/05/18 0442 04/05/18 1735 04/06/18 0445 04/07/18 0317    CREATININE 8.19* 7.80* 4.90* 2.37* 1.62* 0.91  -Resolved   Relative adrenal insufficiency  --Home dose steroids restarted.   Altered mental status -Most likely secondary to hypoxia, ICU delirium, hypotension and shock. - Resolved -Minimize sedating medication.  Goals of care -PT/OT consult: Patient with acute on chronic respiratory failure secondary to ILD, RA, tobacco abuse, COPD evaluate for CIR vs SNF     DVT prophylaxis: SCD/heparin Code Status: Full Family Communication: Wife at bedside for discussion of plan of care Disposition Plan: TBD   Consultants:  PCCM     Procedures/Significant Events:  CT head 6/3 > no acute process. CXR 6/3 > no acute process. Echo 6/4 > LVEF 30-35%, Grade 2 DD    I have personally reviewed and interpreted all radiology studies and my findings are as above.  VENTILATOR SETTINGS:    Cultures Blood 6/3 >  Urine 6/3 >    Antimicrobials: Anti-infectives (From admission, onward)   Start     Stop   04/07/18 1130  hydroxychloroquine (PLAQUENIL) tablet 200 mg         04/06/18 1700  vancomycin (VANCOCIN) IVPB 1000 mg/200 mL premix  Status:  Discontinued     04/06/18 0708   04/06/18 0900  piperacillin-tazobactam (ZOSYN) IVPB 3.375 g  Status:  Discontinued     04/06/18 0756   04/06/18 0800  vancomycin (VANCOCIN) IVPB 750 mg/150 ml premix  Status:  Discontinued     04/06/18 0756   04/05/18 0100  piperacillin-tazobactam (ZOSYN) IVPB 2.25 g  Status:  Discontinued     04/06/18 0708   04/04/18 2145  vancomycin (VANCOCIN) IVPB 1000 mg/200 mL premix     04/04/18 2255   04/04/18 1615  piperacillin-tazobactam (ZOSYN) IVPB 3.375 g     04/04/18 1856   04/04/18 1615  vancomycin (VANCOCIN) IVPB 1000 mg/200 mL premix     04/04/18 1857       Devices    LINES / TUBES:      Continuous Infusions: . sodium chloride Stopped (04/05/18 0154)  . lactated ringers 50 mL/hr (04/07/18 0800)     Objective: Vitals:   04/07/18 0500  04/07/18 0600 04/07/18 0718 04/07/18 0746  BP: (!) 139/91 (!) 157/116    Pulse: 88 (!) 101    Resp: (!) 28 (!) 41    Temp:   98.2 F (36.8 C)   TempSrc:   Oral   SpO2: 99% (!) 89%    Weight:    214 lb 4.6 oz (97.2 kg)  Height:    6\' 1"  (1.854 m)    Intake/Output Summary (Last 24 hours) at 04/07/2018 0816 Last data filed at 04/07/2018 0800 Gross per 24 hour  Intake 1488.33 ml  Output 602 ml  Net 886.33 ml   Filed Weights   04/05/18 0154 04/06/18 0500 04/07/18 0746  Weight: 222 lb 7.1 oz (100.9 kg) 222 lb 7.1 oz (100.9 kg) 214 lb 4.6 oz (97.2 kg)    Examination:  General: A/O x4, positive acute on chronic respiratory distress Neck:  Negative scars, masses, torticollis, lymphadenopathy, JVD Lungs: diffuse expiratory wheeze, negative  crackles  Cardiovascular: Regular rate and rhythm without murmur gallop or rub normal S1 and S2 Abdomen: MORBIDLY OBESE, negative abdominal pain, nondistended, positive soft, bowel sounds, no rebound, no ascites, no appreciable mass Extremities: No significant cyanosis, clubbing, or edema bilateral lower extremities Skin: Stage II ulcer right buttocks  Psychiatric:  Negative depression, negative anxiety, negative fatigue, negative mania, EXTREMELY poor understanding of multiple medical problems. Central nervous system:  Cranial nerves II through XII intact, tongue/uvula midline, all extremities muscle strength 5/5, sensation intact throughout, positive dysarthria (just received a dose of Phenergan), negative expressive aphasia, negative receptive aphasia.  .     Data Reviewed: Care during the described time interval was provided by me .  I have reviewed this patient's available data, including medical history, events of note, physical examination, and all test results as part of my evaluation.   CBC: Recent Labs  Lab 04/04/18 1505 04/04/18 1617 04/05/18 0442 04/06/18 0445 04/07/18 0317  WBC 14.6*  --  13.7* 9.6 10.8*  NEUTROABS 12.8*  --   --    --   --   HGB 10.9* 10.9* 10.0* 10.6* 10.9*  HCT 33.1* 32.0* 30.3* 32.6* 33.8*  MCV 89.7  --  88.3 90.6 89.2  PLT 192  --  171 184 212   Basic Metabolic Panel: Recent Labs  Lab 04/04/18 1505 04/04/18 1617 04/05/18 0442 04/05/18 1735 04/06/18 0445 04/07/18 0317  NA 133* 131* 139 138 142 145  K 5.6* 5.6* 4.8 4.3 4.3 3.9  CL 98* 100* 107 107 108 111  CO2 18*  --  17* 21* 25 25  GLUCOSE 76 71 100* 119* 119* 119*  BUN 78* 85* 62* 46* 35* 24*  CREATININE 8.19* 7.80* 4.90* 2.37* 1.62* 0.91  CALCIUM 8.0*  --  7.4* 7.8* 8.2* 8.6*  MG  --   --  1.9  --  2.2 2.1  PHOS  --   --  5.0*  --  2.4*  --    GFR: Estimated Creatinine Clearance: 103.3 mL/min (by C-G formula based on SCr of 0.91 mg/dL). Liver Function Tests: Recent Labs  Lab 04/04/18 1505 04/07/18 0317  AST 92* 67*  ALT 34 45  ALKPHOS 76 64  BILITOT 1.1 0.6  PROT 6.3* 6.1*  ALBUMIN 2.7* 2.3*   No results for input(s): LIPASE, AMYLASE in the last 168 hours. No results for input(s): AMMONIA in the last 168 hours. Coagulation Profile: Recent Labs  Lab 04/04/18 1505  INR 1.05   Cardiac Enzymes: Recent Labs  Lab 04/05/18 0111 04/05/18 0442 04/07/18 0317  TROPONINI 0.28* 0.86* 0.48*   BNP (last 3 results) No results for input(s): PROBNP in the last 8760 hours. HbA1C: No results for input(s): HGBA1C in the last 72 hours. CBG: Recent Labs  Lab 04/05/18 0028 04/05/18 1117 04/05/18 1548  GLUCAP 95 139* 121*   Lipid Profile: No results for input(s): CHOL, HDL, LDLCALC, TRIG, CHOLHDL, LDLDIRECT in the last 72 hours. Thyroid Function Tests: No results for input(s): TSH, T4TOTAL, FREET4, T3FREE, THYROIDAB in the last 72 hours. Anemia Panel: No results for input(s): VITAMINB12, FOLATE, FERRITIN, TIBC, IRON, RETICCTPCT in the last 72 hours. Urine analysis:    Component Value Date/Time   COLORURINE AMBER (A) 04/04/2018 1941   APPEARANCEUR HAZY (A) 04/04/2018 1941   LABSPEC 1.021 04/04/2018 1941   PHURINE 5.0  04/04/2018 1941   GLUCOSEU NEGATIVE 04/04/2018 1941   HGBUR SMALL (A) 04/04/2018 1941   BILIRUBINUR NEGATIVE 04/04/2018 1941   KETONESUR NEGATIVE 04/04/2018 1941  PROTEINUR NEGATIVE 04/04/2018 1941   NITRITE NEGATIVE 04/04/2018 1941   LEUKOCYTESUR NEGATIVE 04/04/2018 1941   Sepsis Labs: @LABRCNTIP (procalcitonin:4,lacticidven:4)  ) Recent Results (from the past 240 hour(s))  Blood Culture (routine x 2)     Status: None (Preliminary result)   Collection Time: 04/04/18  4:25 PM  Result Value Ref Range Status   Specimen Description BLOOD RIGHT ANTECUBITAL  Final   Special Requests   Final    BOTTLES DRAWN AEROBIC AND ANAEROBIC Blood Culture adequate volume   Culture   Final    NO GROWTH 2 DAYS Performed at [facility], [address], [place], [place] [address]    Report Status PENDING  Incomplete  Blood Culture (routine x 2)     Status: None (Preliminary result)   Collection Time: 04/04/18  5:20 PM  Result Value Ref Range Status   Specimen Description BLOOD LEFT HAND  Final   Special Requests   Final    BOTTLES DRAWN AEROBIC ONLY Blood Culture results may not be optimal due to an inadequate volume of blood received in culture bottles   Culture   Final    NO GROWTH 2 DAYS Performed at [facility], [address] [place], [place] [address]    Report Status PENDING  Incomplete  Urine culture     Status: None   Collection Time: 04/04/18  7:45 PM  Result Value Ref Range Status   Specimen Description URINE, RANDOM  Final   Special Requests NONE  Final   Culture   Final    NO GROWTH Performed at [facility], [address] [place], [place] [address]    Report Status 04/06/2018 FINAL  Final  Culture, blood (routine x 2)     Status: None (Preliminary result)   Collection Time: 04/05/18 12:54 AM  Result Value Ref Range Status   Specimen Description BLOOD RIGHT HAND  Final   Special Requests   Final    BOTTLES DRAWN AEROBIC ONLY Blood Culture adequate  volume   Culture   Final    NO GROWTH 1 DAY Performed at [facility], [address], [place], [place] [address]    Report Status PENDING  Incomplete  Culture, blood (routine x 2)     Status: None (Preliminary result)   Collection Time: 04/05/18  1:11 AM  Result Value Ref Range Status   Specimen Description BLOOD LEFT A-LINE  Final   Special Requests   Final    BOTTLES DRAWN AEROBIC ONLY Blood Culture adequate volume   Culture   Final    NO GROWTH 1 DAY Performed at [facility], [address], [place], [place] [address]    Report Status PENDING  Incomplete  MRSA PCR Screening     Status: None   Collection Time: 04/05/18  4:04 AM  Result Value Ref Range Status   MRSA by PCR NEGATIVE NEGATIVE Final    Comment:        The GeneXpert MRSA Assay (FDA approved for NASAL specimens only), is one component of a comprehensive MRSA colonization surveillance program. It is not intended to diagnose MRSA infection nor to guide or monitor treatment for MRSA infections. Performed at [facility], [address], [place], [place] [address]          Radiology Studies: Dg Chest Port 1 View  Result Date: 04/06/2018 CLINICAL DATA:  Sepsis EXAM: PORTABLE CHEST 1 VIEW COMPARISON:  Chest CT August 17, 2017; chest radiograph September 08, 2017; chest radiograph April 04, 2018 FINDINGS: There is widespread parenchymal fibrosis throughout the lungs. There is no frank edema or consolidation. There is cardiomegaly with pulmonary vascularity normal. No adenopathy. Patient is status post coronary artery bypass grafting. Multiple sternal wires are fractured, stable. There old healed rib fractures on the left. IMPRESSION: Fibrosis throughout the lungs bilaterally without frank edema or consolidation. Stable cardiomegaly. Multiple sternal wires fractured. Old rib fractures on the left are stable in appearance. No pneumothorax. Electronically Signed   By: Bretta Bang III M.D.   On:  04/06/2018 07:42        Scheduled Meds: . aspirin  81 mg Oral Daily  . atorvastatin  40 mg Oral q1800  . heparin  5,000 Units Subcutaneous Q8H  . nicotine  7 mg Transdermal Daily  . pantoprazole  40 mg Oral Q1200  . predniSONE  10 mg Oral Q breakfast   Continuous Infusions: . sodium chloride Stopped (04/05/18 0154)  . lactated ringers 50 mL/hr (04/07/18 0800)     LOS: 3 days    Time spent: 40 minutes    Brieanna Nau, Roselind Messier, MD [facility] Pager [phone]   If 7PM-7AM, please contact night-coverage [email or web address] Password [email or web address] 04/07/2018, 8:16 AM

## 2018-04-07 NOTE — Progress Notes (Signed)
eLink Physician-Brief Progress Note Patient Name: [name] DOB: July 03, 1956 MRN: [id number]   Date of Service  04/07/2018  HPI/Events of Note  Nausea and vomiting  eICU Interventions  Zofran 4 mg iv Q 6 hrs prn nausea or vomiting        Migdalia Dk 04/07/2018, 6:20 AM

## 2018-04-07 NOTE — Evaluation (Signed)
Physical Therapy Evaluation Patient Details Name: [name] MRN: [id number] DOB: April 07, 1956 Today's Date: 04/07/2018   History of Present Illness  62 y.o.WM PMHx CVA, AAA ,CAD, Chronic combined systolic and diastolic CHF(April 2018 EF 35 to 40%),MI,  ILD, Lung nodule,  (followed by Dr. [name] and on chronic prednisone),Tobacco abuse, MI, NSVT, Normocytic anemia,Pancreatitis, RA (rheumatoid arthritis),Patient with acute on chronic respiratory failure secondary to ILD.   Clinical Impression  Pt admitted with above diagnosis. Pt currently with functional limitations due to the deficits listed below (see PT Problem List). PTA pt was ambulating limited community distances with cane and performed ADLs. Within last 3 weeks has not been able to ambulate except in house and in last 3 days could not perform ADLs. Pt currently maxAx2 for bed mobility and maxAx2 for squat pivot transfer to chair.  Pt will benefit from skilled PT to increase their independence and safety with mobility to allow discharge to the venue listed below.       Follow Up Recommendations SNF    Equipment Recommendations  None recommended by PT    Recommendations for Other Services       Precautions / Restrictions Precautions Precautions: Fall Restrictions Weight Bearing Restrictions: No      Mobility  Bed Mobility Overal bed mobility: Needs Assistance Bed Mobility: Supine to Sit     Supine to sit: Max assist;+2 for physical assistance     General bed mobility comments: maxA for trunk to upright and LE management off of bed, pt able to initiate movement  Transfers Overall transfer level: Needs assistance Equipment used: 2 person hand held assist Transfers: Sit to/from [employer] Sit to Stand: +2 physical assistance;Total assist   Squat pivot transfers: +2 physical assistance;Max assist     General transfer comment: Total Ax2 for sit to stand, pt unable to attain fully upright, pt  requires maxAx2 for squat pivot transfer to dropped arm recliner in 4 bouts of movement, pt able to assist minimally with forward lean and initiation of LE assist        Balance Overall balance assessment: Needs assistance Sitting-balance support: Feet supported;Bilateral upper extremity supported Sitting balance-Leahy Scale: Zero Sitting balance - Comments: requires assist for maintaining balance with small bouts of 5-10 sec of min guard assist                                     Pertinent Vitals/Pain Pain Assessment: No/denies pain    Home Living Family/patient expects to be discharged to:: Private residence Living Arrangements: Other relatives(sister) Available Help at Discharge: Family;Available 24 hours/day Type of Home: Mobile home Home Access: Stairs to enter Entrance Stairs-Rails: Left Entrance Stairs-Number of Steps: 6 Home Layout: One level Home Equipment: Walker - 2 wheels;Wheelchair - manual;Cane - single point;Walker - 4 wheels;Shower seat      Prior Function Level of Independence: Independent with assistive device(s)(reports walking in grocery store and ADLs 2 weeks ago )                  Extremity/Trunk Assessment   Upper Extremity Assessment Upper Extremity Assessment: Generalized weakness    Lower Extremity Assessment Lower Extremity Assessment: RLE deficits/detail;LLE deficits/detail RLE Deficits / Details: PROM WFL, strength grossly 2+/5 RLE Sensation: decreased light touch(reports not being able to feel pressure wounds on buttock ) LLE Deficits / Details: L-sided weakness prior CVA, PROM WFL, strength grossly 3/5 LLE Sensation:  decreased light touch(reports not being able to feel pressure wounds on buttock )       Communication   Communication: No difficulties  Cognition Arousal/Alertness: Awake/alert Behavior During Therapy: WFL for tasks assessed/performed;Flat affect Overall Cognitive Status: Within Functional Limits for  tasks assessed                                        General Comments General comments (skin integrity, edema, etc.): Pt on 3 L O2 via nasal cannula, vitals stable throughout session        Assessment/Plan    PT Assessment Patient needs continued PT services  PT Problem List Decreased strength;Decreased activity tolerance;Decreased balance;Decreased mobility;Decreased safety awareness       PT Treatment Interventions DME instruction;Gait training;Stair training;Functional mobility training;Therapeutic activities;Therapeutic exercise;Balance training;Cognitive remediation;Patient/family education    PT Goals (Current goals can be found in the Care Plan section)  Acute Rehab PT Goals Patient Stated Goal: get strength back  PT Goal Formulation: With patient/family Time For Goal Achievement: 04/21/18 Potential to Achieve Goals: Fair    Frequency Min 2X/week    AM-PAC PT "6 Clicks" Daily Activity  Outcome Measure Difficulty turning over in bed (including adjusting bedclothes, sheets and blankets)?: A Lot Difficulty moving from lying on back to sitting on the side of the bed? : Unable Difficulty sitting down on and standing up from a chair with arms (e.g., wheelchair, bedside commode, etc,.)?: Unable Help needed moving to and from a bed to chair (including a wheelchair)?: Total Help needed walking in hospital room?: Total Help needed climbing 3-5 steps with a railing? : Total 6 Click Score: 7    End of Session Equipment Utilized During Treatment: Gait belt;Oxygen Activity Tolerance: Patient tolerated treatment well Patient left: in chair;with call bell/phone within reach;with chair alarm set;with family/visitor present Nurse Communication: Mobility status;Need for lift equipment PT Visit Diagnosis: Unsteadiness on feet (R26.81);Other abnormalities of gait and mobility (R26.89);Muscle weakness (generalized) (M62.81);Difficulty in walking, not elsewhere classified  (R26.2);Other symptoms and signs involving the nervous system (R29.898)    Time: 1400-1443 PT Time Calculation (min) (ACUTE ONLY): 43 min   Charges:   PT Evaluation $PT Eval Moderate Complexity: 1 Mod PT Treatments $Therapeutic Activity: 23-37 mins   PT G Codes:        Ollie Delano B. Beverely Risen PT, DPT Acute Rehabilitation  [phone] Pager [phone]    Elon Alas Fleet 04/07/2018, 3:18 PM

## 2018-04-08 LAB — GASTROINTESTINAL PANEL BY PCR, STOOL (REPLACES STOOL CULTURE)
ADENOVIRUS F40/41: NOT DETECTED
ASTROVIRUS: NOT DETECTED
Campylobacter species: NOT DETECTED
Cryptosporidium: NOT DETECTED
Cyclospora cayetanensis: NOT DETECTED
ENTAMOEBA HISTOLYTICA: NOT DETECTED
ENTEROPATHOGENIC E COLI (EPEC): NOT DETECTED
ENTEROTOXIGENIC E COLI (ETEC): NOT DETECTED
Enteroaggregative E coli (EAEC): NOT DETECTED
Giardia lamblia: NOT DETECTED
Norovirus GI/GII: NOT DETECTED
Plesimonas shigelloides: NOT DETECTED
Rotavirus A: NOT DETECTED
SAPOVIRUS (I, II, IV, AND V): NOT DETECTED
Salmonella species: NOT DETECTED
Shiga like toxin producing E coli (STEC): NOT DETECTED
Shigella/Enteroinvasive E coli (EIEC): NOT DETECTED
VIBRIO CHOLERAE: NOT DETECTED
VIBRIO SPECIES: NOT DETECTED
Yersinia enterocolitica: NOT DETECTED

## 2018-04-08 LAB — BASIC METABOLIC PANEL
ANION GAP: 11 (ref 5–15)
BUN: 19 mg/dL (ref 6–20)
CO2: 23 mmol/L (ref 22–32)
Calcium: 8.5 mg/dL — ABNORMAL LOW (ref 8.9–10.3)
Chloride: 111 mmol/L (ref 101–111)
Creatinine, Ser: 0.8 mg/dL (ref 0.61–1.24)
GFR calc Af Amer: >60 mL/min (ref >60)
GFR calc non Af Amer: >60 mL/min (ref >60)
GLUCOSE: 89 mg/dL (ref 65–99)
POTASSIUM: 3.8 mmol/L (ref 3.5–5.1)
Sodium: 145 mmol/L (ref 135–145)

## 2018-04-08 LAB — CBC
HEMATOCRIT: 33.3 % — AB (ref 39.0–52.0)
Hemoglobin: 10.4 g/dL — ABNORMAL LOW (ref 13.0–17.0)
MCH: 29.1 pg (ref 26.0–34.0)
MCHC: 31.2 g/dL (ref 30.0–36.0)
MCV: 93.3 fL (ref 78.0–100.0)
Platelets: 184 10*3/uL (ref 150–400)
RBC: 3.57 MIL/uL — AB (ref 4.22–5.81)
RDW: 15.2 % (ref 11.5–15.5)
WBC: 8.5 10*3/uL (ref 4.0–10.5)

## 2018-04-08 LAB — MAGNESIUM: Magnesium: 1.8 mg/dL (ref 1.7–2.4)

## 2018-04-08 MED ORDER — ENSURE ENLIVE PO LIQD
237.0000 mL | Freq: Three times a day (TID) | ORAL | Status: DC
  Administered 2018-04-08 – 2018-04-12 (×12): 237 mL via ORAL

## 2018-04-08 MED ORDER — ADULT MULTIVITAMIN W/MINERALS CH
1.0000 | ORAL_TABLET | Freq: Every day | ORAL | Status: DC
  Administered 2018-04-08 – 2018-04-12 (×5): 1 via ORAL
  Filled 2018-04-08 (×5): qty 1

## 2018-04-08 MED ORDER — DIPHENOXYLATE-ATROPINE 2.5-0.025 MG/5ML PO LIQD
10.0000 mL | Freq: Four times a day (QID) | ORAL | Status: DC
  Administered 2018-04-08 – 2018-04-10 (×9): 10 mL via ORAL
  Filled 2018-04-08 (×10): qty 10

## 2018-04-08 NOTE — Congregational Nurse Program (Signed)
WOC Nurse wound consult note The purpose of my visit today is to evaluate how the patient is responding to the care orders placed 04/07/18.  Today he is in room 3E08 on an air mattress with one Dermatherapy pad beneath his buttocks.  There are multiple foam dressings in place to the sacral/buttocks area.  A flexiseal is in use for fecal containment.  [employer] clear is at the bedside.  I have entered orders for turning side to side every 2 hours and to use [employer] clear beneath the foam dressings.  I encouraged the patient to eat foods with high levels of protein such as eggs and beef, and to let the staff know if he can think of anything he is hungry for.  His sister was present throughout my evaluation.  All questions of the patient and his sister were answered to their expressed satisfaction. Monitor the wound area(s) for worsening of condition such as: Signs/symptoms of infection,  Increase in size,  Development of or worsening of odor, Development of pain, or increased pain at the affected locations.  Notify the medical team if any of these develop.  Thank you for the consult.  Discussed plan of care with the patient and bedside nurse.  WOC nurse will not follow at this time.  Please re-consult the WOC team if needed.  Helmut Muster, RN, MSN, CWOCN, CNS-BC, pager [phone]
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
## 2018-04-08 NOTE — Progress Notes (Signed)
PROGRESS NOTE    [name]  FXT:[id number] DOB: 1955-12-12 DOA: 04/04/2018 PCP: [name], PA-C   Brief Narrative:  62 y.o.WM PMHx CVA, AAA ,CAD, Chronic combined systolic and diastolic CHF(April 2018 EF 35 to 40%),MI,  ILD, Lung nodule,  (followed by Dr. [name] and on chronic prednisone),Tobacco abuse, MI, NSVT, Normocytic anemia,Pancreatitis, RA (rheumatoid arthritis),  Presented to [facility] ED 6/3 due to generalized weakness. Per sister, he was normal the day prior and when he woke up 6/3, he had profound weakness. At baseline, pt is fairly sedentary. He lives with sister who is primary caregiver. Sister states he can perform most ADLs independently but does occasionally need help.  On day of presentation, pt could barely get out of bed and / or into his recliner and due to fear of him falling while trying, sister called EMS.  In ED, he was found to have hypotension (SBP in 70's), hyponatremia, hyperkalemia, AKI (SCr up to 8). He remained hypotensive despite 4L IVF and was subsequently started on norepinephrine. CT of the head was negative. He had bladder scan that showed ~500cc urine. After foley was placed, he has had roughly 900cc UOP since arrival in ED about 7 hours ago.  He denies any recent fevers/chills/sweats, headaches, chest pain, SOB, N/V/D, abd pain, myalgias, exposure to known sick contacts. He has had dry cough for 3 - 4 days which has not changed. He has been taking all of his medications as prescribed including anti-hypertensives. He has been eating OK and drinking fairly well but per sister, has not been drinking as much water as he should (though does drink lots of tea and soda).  Assessment & Plan:   Principal Problem:   Shock (HCC) Active Problems:   CAD (coronary artery disease)   ILD (interstitial lung disease) (HCC)   Chronic combined systolic and diastolic CHF (congestive heart failure) (HCC)   Leukocytosis   High anion gap metabolic  acidosis   Acute kidney injury (HCC)   Sacral ulcer, with unspecified severity (HCC)  Hypovolemic shock  -Felt to be secondary to hypovolemia + component of adrenal insufficiency although the inciting cause for his hypovolemia is not immediately clear to me on chart review and discussion with him today.  His opiates were increased on 5/31.  Infection seems to have been ruled out at this point with negative blood and urine cultures (sacral wounds don't appear infected).  Now on his home steroid dose after stress dose.  He has diarrhea, but this seems to have started after his hospitalization.      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 [name] PCCM - On chronic steroids.  Prednisone decreased to 10 mg daily (home dose) -Restart Plaquenil 200 mg BID -DuoNeb TID -Albuterol PRN   Chronic respiratory failure with hypoxia (home O2; 2 L O2 nocturnally) -Multifactorial to include ILD, COPD, continued tobacco abuse -Patient counseled on need to absolutely discontinue smoking. -Titrate O2 to maintain SPO2 89 to 93% - on O2 right now, will wean daytime O2 as tolerated  COPD/Tobacco abuse - Nicotine patch   Chronic systolic and diastolic CHF - 6/4 Echocardiogram: LVEF= 30 to 35%, grade 2 diastolic dysfunction - BP increasing restart Coreg 12.5 mg BID (home dose 25 mg) - holding spironolactone, entresto, lasix at this time -Strict in and out -Daily weight - Transfuse for hemoglobin <8  Elevated troponin  Demand ischemia:  Echo with EF 30-35% (slightly decreased from EF 01/2017 with 35-40%).    -  Felt secondary to stress (Type 2 NSTEMI with renal failure contributing) - would recommend outpatient cardiology follow up - of note, with previous cath in 01/2017, recommended considering AICD if no improvement in LV function with medical therapy - continue asa and atorvastatin  Hx MI -ASA, atorvastatin  Essential HTN -coreg - holding spironolactone, entresto, lasix  CAD -S/P  CABG -See CHF  AAA: noted  Hx NSVT: noted, none noted today on tele.  As above, likely needs AICD at some point.  RIGHTStage II buttocks ulcer - Wound care consult - Doesn't appear infected at this time.  . - nutrition consult  Diarrhea - negative GI path and C diff - lomotil started - flexiseal currently in place, continue to monitor  Acute kidney injury - Due to shock above, peaked ~8, now back to baseline  Relative adrenal insufficiency  --Home dose steroids restarted.  Altered mental status -Most likely secondary to hypoxia, ICU delirium, hypotension and shock. - Resolved -Minimize sedating medication.  Goals of care -PT/OT consult: Patient with acute on chronic respiratory failure secondary to ILD, RA, tobacco abuse, COPD evaluate for CIR vs SNF  DVT prophylaxis: heparin Code Status: full code Family Communication: none at bedside Disposition Plan: pending improvement   Consultants:   PCCM  Procedures:  CT head 6/3 > no acute process. CXR 6/3 > no acute process. Echo 6/4 >LVEF 30-35%, Grade 2 DD  Antimicrobials: Anti-infectives (From admission, onward)   Start     Dose/Rate Route Frequency Ordered Stop   04/07/18 1200  hydroxychloroquine (PLAQUENIL) tablet 200 mg     200 mg Oral 2 times daily 04/07/18 1119     04/06/18 1700  vancomycin (VANCOCIN) IVPB 1000 mg/200 mL premix  Status:  Discontinued     1,000 mg 200 mL/hr over 60 Minutes Intravenous Every 48 hours 04/04/18 1653 04/06/18 0708   04/06/18 0900  piperacillin-tazobactam (ZOSYN) IVPB 3.375 g  Status:  Discontinued     3.375 g 12.5 mL/hr over 240 Minutes Intravenous Every 8 hours 04/06/18 0708 04/06/18 0756   04/06/18 0800  vancomycin (VANCOCIN) IVPB 750 mg/150 ml premix  Status:  Discontinued     750 mg 150 mL/hr over 60 Minutes Intravenous Every 12 hours 04/06/18 0708 04/06/18 0756   04/05/18 0100  piperacillin-tazobactam (ZOSYN) IVPB 2.25 g  Status:  Discontinued     2.25 g 100  mL/hr over 30 Minutes Intravenous Every 8 hours 04/04/18 1653 04/06/18 0708   04/04/18 2145  vancomycin (VANCOCIN) IVPB 1000 mg/200 mL premix     1,000 mg 200 mL/hr over 60 Minutes Intravenous  Once 04/04/18 2137 04/04/18 2255   04/04/18 1615  piperacillin-tazobactam (ZOSYN) IVPB 3.375 g     3.375 g 100 mL/hr over 30 Minutes Intravenous  Once 04/04/18 1612 04/04/18 1856   04/04/18 1615  vancomycin (VANCOCIN) IVPB 1000 mg/200 mL premix     1,000 mg 200 mL/hr over 60 Minutes Intravenous  Once 04/04/18 1612 04/04/18 1857     Subjective: Came in because he couldn't walk. No fevers.  Still weak.  Uses 2 L at night.  No CP.  Hx stroke with L sided weakness.  Objective: Vitals:   04/07/18 2016 04/07/18 2041 04/08/18 0406 04/08/18 0812  BP:  133/81 106/71   Pulse:  87 81   Resp:  18 18   Temp:  98.4 F (36.9 C) 98.4 F (36.9 C)   TempSrc:  Oral Oral   SpO2: 95% 95% 98% 100%  Weight:   95.8 kg (  211 lb 3.2 oz)   Height:        Intake/Output Summary (Last 24 hours) at 04/08/2018 1037 Last data filed at 04/08/2018 0939 Gross per 24 hour  Intake 1105 ml  Output 800 ml  Net 305 ml   Filed Weights   04/07/18 0746 04/07/18 1635 04/08/18 0406  Weight: 97.2 kg (214 lb 4.6 oz) 95.7 kg (211 lb) 95.8 kg (211 lb 3.2 oz)    Examination:  General exam: Appears calm and comfortable  Respiratory system: Clear to auscultation. Respiratory effort normal. Cardiovascular system: S1 & S2 heard, RRR. No JVD, murmurs, rubs, gallops or clicks. No pedal edema. Gastrointestinal system: Abdomen is nondistended, soft and nontender. No organomegaly or masses felt. Normal bowel sounds heard. Central nervous system: Alert and oriented. L sided weakness (chronic) Extremities: Symmetric 5 x 5 power. Skin: full thickness wounds and deep tissue injury to buttocks (don't appear actively infected)  Psychiatry: Judgement and insight appear normal. Mood & affect appropriate.     Data Reviewed: I have  personally reviewed following labs and imaging studies  CBC: Recent Labs  Lab 04/04/18 1505 04/04/18 1617 04/05/18 0442 04/06/18 0445 04/07/18 0317 04/08/18 0709  WBC 14.6*  --  13.7* 9.6 10.8* 8.5  NEUTROABS 12.8*  --   --   --   --   --   HGB 10.9* 10.9* 10.0* 10.6* 10.9* 10.4*  HCT 33.1* 32.0* 30.3* 32.6* 33.8* 33.3*  MCV 89.7  --  88.3 90.6 89.2 93.3  PLT 192  --  171 184 212 184   Basic Metabolic Panel: Recent Labs  Lab 04/05/18 0442 04/05/18 1735 04/06/18 0445 04/07/18 0317 04/08/18 0709  NA 139 138 142 145 145  K 4.8 4.3 4.3 3.9 3.8  CL 107 107 108 111 111  CO2 17* 21* 25 25 23   GLUCOSE 100* 119* 119* 119* 89  BUN 62* 46* 35* 24* 19  CREATININE 4.90* 2.37* 1.62* 0.91 0.80  CALCIUM 7.4* 7.8* 8.2* 8.6* 8.5*  MG 1.9  --  2.2 2.1 1.8  PHOS 5.0*  --  2.4*  --   --    GFR: Estimated Creatinine Clearance: 108.2 mL/min (by C-G formula based on SCr of 0.8 mg/dL). Liver Function Tests: Recent Labs  Lab 04/04/18 1505 04/07/18 0317  AST 92* 67*  ALT 34 45  ALKPHOS 76 64  BILITOT 1.1 0.6  PROT 6.3* 6.1*  ALBUMIN 2.7* 2.3*   No results for input(s): LIPASE, AMYLASE in the last 168 hours. No results for input(s): AMMONIA in the last 168 hours. Coagulation Profile: Recent Labs  Lab 04/04/18 1505  INR 1.05   Cardiac Enzymes: Recent Labs  Lab 04/05/18 0111 04/05/18 0442 04/07/18 0317  TROPONINI 0.28* 0.86* 0.48*   BNP (last 3 results) No results for input(s): PROBNP in the last 8760 hours. HbA1C: No results for input(s): HGBA1C in the last 72 hours. CBG: Recent Labs  Lab 04/05/18 0028 04/05/18 1117 04/05/18 1548  GLUCAP 95 139* 121*   Lipid Profile: No results for input(s): CHOL, HDL, LDLCALC, TRIG, CHOLHDL, LDLDIRECT in the last 72 hours. Thyroid Function Tests: No results for input(s): TSH, T4TOTAL, FREET4, T3FREE, THYROIDAB in the last 72 hours. Anemia Panel: No results for input(s): VITAMINB12, FOLATE, FERRITIN, TIBC, IRON, RETICCTPCT in  the last 72 hours. Sepsis Labs: Recent Labs  Lab 04/04/18 1617 04/04/18 1756 04/05/18 0111 04/05/18 0442 04/06/18 0445  PROCALCITON  --   --  1.69 2.37 0.70  LATICACIDVEN 2.27* 1.02  --   --   --  Recent Results (from the past 240 hour(s))  Blood Culture (routine x 2)     Status: None (Preliminary result)   Collection Time: 04/04/18  4:25 PM  Result Value Ref Range Status   Specimen Description BLOOD RIGHT ANTECUBITAL  Final   Special Requests   Final    BOTTLES DRAWN AEROBIC AND ANAEROBIC Blood Culture adequate volume   Culture   Final    NO GROWTH 3 DAYS Performed at [facility], [address] [place], [place] [address]    Report Status PENDING  Incomplete  Blood Culture (routine x 2)     Status: None (Preliminary result)   Collection Time: 04/04/18  5:20 PM  Result Value Ref Range Status   Specimen Description BLOOD LEFT HAND  Final   Special Requests   Final    BOTTLES DRAWN AEROBIC ONLY Blood Culture results may not be optimal due to an inadequate volume of blood received in culture bottles   Culture   Final    NO GROWTH 3 DAYS Performed at [facility], [address] [place], [place] [address]    Report Status PENDING  Incomplete  Urine culture     Status: None   Collection Time: 04/04/18  7:45 PM  Result Value Ref Range Status   Specimen Description URINE, RANDOM  Final   Special Requests NONE  Final   Culture   Final    NO GROWTH Performed at [facility], [address] [place], [place] [address]    Report Status 04/06/2018 FINAL  Final  Culture, blood (routine x 2)     Status: None (Preliminary result)   Collection Time: 04/05/18 12:54 AM  Result Value Ref Range Status   Specimen Description BLOOD RIGHT HAND  Final   Special Requests   Final    BOTTLES DRAWN AEROBIC ONLY Blood Culture adequate volume   Culture   Final    NO GROWTH 2 DAYS Performed at [facility], [address], [place], [place] [address]    Report  Status PENDING  Incomplete  Culture, blood (routine x 2)     Status: None (Preliminary result)   Collection Time: 04/05/18  1:11 AM  Result Value Ref Range Status   Specimen Description BLOOD LEFT A-LINE  Final   Special Requests   Final    BOTTLES DRAWN AEROBIC ONLY Blood Culture adequate volume   Culture   Final    NO GROWTH 2 DAYS Performed at [facility], [address], [place], [place] [address]    Report Status PENDING  Incomplete  MRSA PCR Screening     Status: None   Collection Time: 04/05/18  4:04 AM  Result Value Ref Range Status   MRSA by PCR NEGATIVE NEGATIVE Final    Comment:        The GeneXpert MRSA Assay (FDA approved for NASAL specimens only), is one component of a comprehensive MRSA colonization surveillance program. It is not intended to diagnose MRSA infection nor to guide or monitor treatment for MRSA infections. Performed at [facility], [address], [place], [place] [address]   C difficile quick scan w PCR reflex     Status: None   Collection Time: 04/07/18  1:16 PM  Result Value Ref Range Status   C Diff antigen NEGATIVE NEGATIVE Final   C Diff toxin NEGATIVE NEGATIVE Final   C Diff interpretation No C. difficile detected.  Final  Comment: Performed at [facility], [address], [place], [place] [address]  Gastrointestinal Panel by PCR , Stool     Status: None   Collection Time: 04/07/18  1:16 PM  Result Value Ref Range Status   Campylobacter species NOT DETECTED NOT DETECTED Final   Plesimonas shigelloides NOT DETECTED NOT DETECTED Final   Salmonella species NOT DETECTED NOT DETECTED Final   Yersinia enterocolitica NOT DETECTED NOT DETECTED Final   Vibrio species NOT DETECTED NOT DETECTED Final   Vibrio cholerae NOT DETECTED NOT DETECTED Final   Enteroaggregative E coli (EAEC) NOT DETECTED NOT DETECTED Final   Enteropathogenic E coli (EPEC) NOT DETECTED NOT DETECTED Final   Enterotoxigenic E coli (ETEC) NOT DETECTED  NOT DETECTED Final   Shiga like toxin producing E coli (STEC) NOT DETECTED NOT DETECTED Final   Shigella/Enteroinvasive E coli (EIEC) NOT DETECTED NOT DETECTED Final   Cryptosporidium NOT DETECTED NOT DETECTED Final   Cyclospora cayetanensis NOT DETECTED NOT DETECTED Final   Entamoeba histolytica NOT DETECTED NOT DETECTED Final   Giardia lamblia NOT DETECTED NOT DETECTED Final   Adenovirus F40/41 NOT DETECTED NOT DETECTED Final   Astrovirus NOT DETECTED NOT DETECTED Final   Norovirus GI/GII NOT DETECTED NOT DETECTED Final   Rotavirus A NOT DETECTED NOT DETECTED Final   Sapovirus (I, II, IV, and V) NOT DETECTED NOT DETECTED Final    Comment: Performed at [facility], [address] [place], [place] [address]         Radiology Studies: No results found.      Scheduled Meds: . aspirin  81 mg Oral Daily  . atorvastatin  40 mg Oral q1800  . carvedilol  12.5 mg Oral BID WC  . heparin  5,000 Units Subcutaneous Q8H  . hydroxychloroquine  200 mg Oral BID  . ipratropium-albuterol  3 mL Nebulization TID  . mouth rinse  15 mL Mouth Rinse BID  . nicotine  7 mg Transdermal Daily  . pantoprazole  40 mg Oral Q1200  . pneumococcal 23 valent vaccine  0.5 mL Intramuscular Tomorrow-1000  . predniSONE  10 mg Oral Q breakfast   Continuous Infusions: . sodium chloride Stopped (04/05/18 0154)  . lactated ringers 50 mL/hr (04/08/18 0943)     LOS: 4 days    Time spent: over 30 min    Lacretia Nicks, MD [facility] Pager [phone]  If 7PM-7AM, please contact night-coverage [email or web address] Password [facility] 04/08/2018, 10:37 AM

## 2018-04-09 LAB — HEPATIC FUNCTION PANEL
ALBUMIN: 2.1 g/dL — AB (ref 3.5–5.0)
ALT: 34 U/L (ref 17–63)
AST: 33 U/L (ref 15–41)
Alkaline Phosphatase: 58 U/L (ref 38–126)
BILIRUBIN TOTAL: 0.8 mg/dL (ref 0.3–1.2)
Bilirubin, Direct: 0.1 mg/dL (ref 0.1–0.5)
Indirect Bilirubin: 0.7 mg/dL (ref 0.3–0.9)
TOTAL PROTEIN: 5.5 g/dL — AB (ref 6.5–8.1)

## 2018-04-09 LAB — CBC
HEMATOCRIT: 32.4 % — AB (ref 39.0–52.0)
Hemoglobin: 10.1 g/dL — ABNORMAL LOW (ref 13.0–17.0)
MCH: 28.9 pg (ref 26.0–34.0)
MCHC: 31.2 g/dL (ref 30.0–36.0)
MCV: 92.6 fL (ref 78.0–100.0)
Platelets: 190 10*3/uL (ref 150–400)
RBC: 3.5 MIL/uL — ABNORMAL LOW (ref 4.22–5.81)
RDW: 14.7 % (ref 11.5–15.5)
WBC: 9.1 10*3/uL (ref 4.0–10.5)

## 2018-04-09 LAB — BASIC METABOLIC PANEL
Anion gap: 7 (ref 5–15)
BUN: 14 mg/dL (ref 6–20)
CHLORIDE: 105 mmol/L (ref 101–111)
CO2: 30 mmol/L (ref 22–32)
CREATININE: 0.73 mg/dL (ref 0.61–1.24)
Calcium: 8.4 mg/dL — ABNORMAL LOW (ref 8.9–10.3)
Glucose, Bld: 97 mg/dL (ref 65–99)
POTASSIUM: 3.7 mmol/L (ref 3.5–5.1)
SODIUM: 142 mmol/L (ref 135–145)

## 2018-04-09 LAB — CULTURE, BLOOD (ROUTINE X 2)
Culture: NO GROWTH
Culture: NO GROWTH
SPECIAL REQUESTS: ADEQUATE

## 2018-04-09 LAB — MAGNESIUM: MAGNESIUM: 1.8 mg/dL (ref 1.7–2.4)

## 2018-04-09 MED ORDER — LOPERAMIDE HCL 2 MG PO CAPS: 2.0000 mg | ORAL_CAPSULE | Freq: Four times a day (QID) | ORAL | Status: DC | PRN

## 2018-04-09 MED ORDER — CARVEDILOL 3.125 MG PO TABS
3.1250 mg | ORAL_TABLET | Freq: Two times a day (BID) | ORAL | Status: DC
  Administered 2018-04-09 – 2018-04-12 (×7): 3.125 mg via ORAL
  Filled 2018-04-09 (×7): qty 1

## 2018-04-09 NOTE — Progress Notes (Signed)
Got call from Tele mon that patient had  Pause of 3.45 and brady down to 32. Patient asymptomatic and was urinating. HR went back to 70,s. On call MD was notified.

## 2018-04-09 NOTE — Progress Notes (Addendum)
PROGRESS NOTE    [name]  ZOX:[id number] DOB: [birth year] DOA: 04/04/2018 PCP: [name], PA-C   Brief Narrative:  62 y.o.WM PMHx CVA, AAA ,CAD, Chronic combined systolic and diastolic CHF(April 2018 EF 35 to 40%),MI,  ILD, Lung nodule,  (followed by Dr. [name] and on chronic prednisone),Tobacco abuse, MI, NSVT, Normocytic anemia,Pancreatitis, RA (rheumatoid arthritis),  Presented to [facility] ED 6/3 due to generalized weakness. Per sister, he was normal the day prior and when he woke up 6/3, he had profound weakness. At baseline, pt is fairly sedentary. He lives with sister who is primary caregiver. Sister states he can perform most ADLs independently but does occasionally need help.  On day of presentation, pt could barely get out of bed and / or into his recliner and due to fear of him falling while trying, sister called EMS.  In ED, he was found to have hypotension (SBP in 70's), hyponatremia, hyperkalemia, AKI (SCr up to 8). He remained hypotensive despite 4L IVF and was subsequently started on norepinephrine. CT of the head was negative. He had bladder scan that showed ~500cc urine. After foley was placed, he has had roughly 900cc UOP since arrival in ED about 7 hours ago.  He denies any recent fevers/chills/sweats, headaches, chest pain, SOB, N/V/D, abd pain, myalgias, exposure to known sick contacts. He has had dry cough for 3 - 4 days which has not changed. He has been taking all of his medications as prescribed including anti-hypertensives. He has been eating OK and drinking fairly well but per sister, has not been drinking as much water as he should (though does drink lots of tea and soda).  Assessment & Plan:   Principal Problem:   Shock (HCC) Active Problems:   CAD (coronary artery disease)   ILD (interstitial lung disease) (HCC)   Chronic combined systolic and diastolic CHF (congestive heart failure) (HCC)   Leukocytosis   High anion gap metabolic  acidosis   Acute kidney injury (HCC)   Sacral ulcer, with unspecified severity (HCC)  Hypovolemic shock  -Felt to be secondary to hypovolemia + component of adrenal insufficiency although the inciting cause for his hypovolemia is not immediately clear to me on chart review and discussion with him.  His opiates were increased on 5/31, but his sister administers this and notes he did not have any extra doses.  Infection seems to have been ruled out at this point with negative blood and urine cultures (sacral wounds don't appear infected).  Now on his home steroid dose after stress dose steroids.  He has diarrhea, but this seems to have started after his hospitalization.  - blood pressure ok, but as SBP as low as 89 earlier today - ctm.     Diarrhea - negative GI path and C diff - still significant amount in flexiseal this morning - lomotil started.  Imodium as well. 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 [name] PCCM - On chronic steroids.  Home dose prednisone resumed. 10 mg daily (home dose) - Plaquenil 200 mg BID - DuoNeb TID - Albuterol PRN   Chronic respiratory failure with hypoxia (home O2; 2 L O2 nocturnally) -Multifactorial to include ILD, COPD, continued tobacco abuse -Patient counseled on need to absolutely discontinue smoking. -Titrate O2 to maintain SPO2 89 to 93% - on O2 right now, will wean daytime O2 as tolerated (doing well on RA  today)  COPD/Tobacco abuse - Nicotine patch   Chronic systolic and diastolic CHF - 6/4 Echocardiogram: LVEF= 30 to 35%, grade 2 diastolic dysfunction - Decrease coreg to 3.125 mg BID - holding spironolactone, entresto, lasix at this time -Strict in and out -Daily weight - Transfuse for hemoglobin <8  Sinus Pause  Bradycardia: had 3.45 second pause this AM, has also had some bradycardia throughout day today.  Will decrease  carvedilol to 3.125 mg BID and continue to monitor.   Elevated troponin  Demand ischemia:  Echo with EF 30-35% (slightly decreased from EF 01/2017 with 35-40%).    -Felt secondary to stress (Type 2 NSTEMI with renal failure contributing) - would recommend outpatient cardiology follow up - of note, with previous cath in 01/2017, recommended considering AICD if no improvement in LV function with medical therapy - continue asa and atorvastatin  Hx MI -ASA, atorvastatin  Essential HTN -coreg (decreased dose with soft BP and bradycardia) - holding spironolactone, entresto, lasix  CAD -S/P CABG -See CHF  AAA: noted  Hx NSVT: noted, none noted today on tele.  As above, likely needs AICD at some point.  RIGHTStage II buttocks ulcer - Wound care consult - Doesn't appear infected at this time.   - nutrition consult  Acute kidney injury - Due to shock above, peaked ~8, now back to baseline  Relative adrenal insufficiency  --Home dose steroids restarted.  Altered mental status -Most likely secondary to hypoxia, ICU delirium, hypotension and shock. - Resolved -Minimize sedating medication.  Goals of care -PT/OT consult: Patient with acute on chronic respiratory failure secondary to ILD, RA, tobacco abuse, COPD evaluate for CIR vs SNF  DVT prophylaxis: heparin Code Status: full code Family Communication: none at bedside Disposition Plan: pending improvement   Consultants:   PCCM  Procedures:  CT head 6/3 > no acute process. CXR 6/3 > no acute process. Echo 6/4 >LVEF 30-35%, Grade 2 DD  Antimicrobials: Anti-infectives (From admission, onward)   Start     Dose/Rate Route Frequency Ordered Stop   04/07/18 1200  hydroxychloroquine (PLAQUENIL) tablet 200 mg     200 mg Oral 2 times daily 04/07/18 1119     04/06/18 1700  vancomycin (VANCOCIN) IVPB 1000 mg/200 mL premix  Status:  Discontinued     1,000 mg 200 mL/hr over 60 Minutes Intravenous Every 48 hours  04/04/18 1653 04/06/18 0708   04/06/18 0900  piperacillin-tazobactam (ZOSYN) IVPB 3.375 g  Status:  Discontinued     3.375 g 12.5 mL/hr over 240 Minutes Intravenous Every 8 hours 04/06/18 0708 04/06/18 0756   04/06/18 0800  vancomycin (VANCOCIN) IVPB 750 mg/150 ml premix  Status:  Discontinued     750 mg 150 mL/hr over 60 Minutes Intravenous Every 12 hours 04/06/18 0708 04/06/18 0756   04/05/18 0100  piperacillin-tazobactam (ZOSYN) IVPB 2.25 g  Status:  Discontinued     2.25 g 100 mL/hr over 30 Minutes Intravenous Every 8 hours 04/04/18 1653 04/06/18 0708   04/04/18 2145  vancomycin (VANCOCIN) IVPB 1000 mg/200 mL premix     1,000 mg 200 mL/hr over 60 Minutes Intravenous  Once 04/04/18 2137 04/04/18 2255   04/04/18 1615  piperacillin-tazobactam (ZOSYN) IVPB 3.375 g     3.375 g 100 mL/hr over 30 Minutes Intravenous  Once 04/04/18 1612 04/04/18 1856   04/04/18 1615  vancomycin (VANCOCIN) IVPB 1000 mg/200 mL premix     1,000 mg 200 mL/hr over 60 Minutes Intravenous  Once 04/04/18 1612 04/04/18 1857  Subjective: Feels ok now.  No complaints at this time.    Objective: Vitals:   04/09/18 0500 04/09/18 0921 04/09/18 1241 04/09/18 1403  BP:   108/86   Pulse:   84   Resp:   20   Temp:   (!) 97.4 F (36.3 C)   TempSrc:   Oral   SpO2:  95% 97% 96%  Weight: 98.9 kg (218 lb)     Height:        Intake/Output Summary (Last 24 hours) at 04/09/2018 1530 Last data filed at 04/09/2018 1500 Gross per 24 hour  Intake 3127 ml  Output 750 ml  Net 2377 ml   Filed Weights   04/07/18 1635 04/08/18 0406 04/09/18 0500  Weight: 95.7 kg (211 lb) 95.8 kg (211 lb 3.2 oz) 98.9 kg (218 lb)    Examination:  General: No acute distress. Cardiovascular: Heart sounds show a regular rate, and rhythm. No gallops or rubs. No murmurs. No JVD. Lungs:Bibasilar crackles Abdomen: Soft, nontender, nondistended with normal active bowel sounds. No masses. No hepatosplenomegaly. Neurological: Alert and oriented  3. Moves all extremities 4. Cranial nerves II through XII grossly intact. Skin: decub not visualized today Extremities: No clubbing or cyanosis. No edema. Psychiatric: Mood and affect are normal. Insight and judgment are appropriate.   Data Reviewed: I have personally reviewed following labs and imaging studies  CBC: Recent Labs  Lab 04/04/18 1505  04/05/18 0442 04/06/18 0445 04/07/18 0317 04/08/18 0709 04/09/18 0631  WBC 14.6*  --  13.7* 9.6 10.8* 8.5 9.1  NEUTROABS 12.8*  --   --   --   --   --   --   HGB 10.9*   < > 10.0* 10.6* 10.9* 10.4* 10.1*  HCT 33.1*   < > 30.3* 32.6* 33.8* 33.3* 32.4*  MCV 89.7  --  88.3 90.6 89.2 93.3 92.6  PLT 192  --  171 184 212 184 190   < > = values in this interval not displayed.   Basic Metabolic Panel: Recent Labs  Lab 04/05/18 0442 04/05/18 1735 04/06/18 0445 04/07/18 0317 04/08/18 0709 04/09/18 0631  NA 139 138 142 145 145 142  K 4.8 4.3 4.3 3.9 3.8 3.7  CL 107 107 108 111 111 105  CO2 17* 21* 25 25 23 30   GLUCOSE 100* 119* 119* 119* 89 97  BUN 62* 46* 35* 24* 19 14  CREATININE 4.90* 2.37* 1.62* 0.91 0.80 0.73  CALCIUM 7.4* 7.8* 8.2* 8.6* 8.5* 8.4*  MG 1.9  --  2.2 2.1 1.8 1.8  PHOS 5.0*  --  2.4*  --   --   --    GFR: Estimated Creatinine Clearance: 118.5 mL/min (by C-G formula based on SCr of 0.73 mg/dL). Liver Function Tests: Recent Labs  Lab 04/04/18 1505 04/07/18 0317 04/09/18 0631  AST 92* 67* 33  ALT 34 45 34  ALKPHOS 76 64 58  BILITOT 1.1 0.6 0.8  PROT 6.3* 6.1* 5.5*  ALBUMIN 2.7* 2.3* 2.1*   No results for input(s): LIPASE, AMYLASE in the last 168 hours. No results for input(s): AMMONIA in the last 168 hours. Coagulation Profile: Recent Labs  Lab 04/04/18 1505  INR 1.05   Cardiac Enzymes: Recent Labs  Lab 04/05/18 0111 04/05/18 0442 04/07/18 0317  TROPONINI 0.28* 0.86* 0.48*   BNP (last 3 results) No results for input(s): PROBNP in the last 8760 hours. HbA1C: No results for input(s): HGBA1C  in the last 72 hours. CBG: Recent Labs  Lab  04/05/18 0028 04/05/18 1117 04/05/18 1548  GLUCAP 95 139* 121*   Lipid Profile: No results for input(s): CHOL, HDL, LDLCALC, TRIG, CHOLHDL, LDLDIRECT in the last 72 hours. Thyroid Function Tests: No results for input(s): TSH, T4TOTAL, FREET4, T3FREE, THYROIDAB in the last 72 hours. Anemia Panel: No results for input(s): VITAMINB12, FOLATE, FERRITIN, TIBC, IRON, RETICCTPCT in the last 72 hours. Sepsis Labs: Recent Labs  Lab 04/04/18 1617 04/04/18 1756 04/05/18 0111 04/05/18 0442 04/06/18 0445  PROCALCITON  --   --  1.69 2.37 0.70  LATICACIDVEN 2.27* 1.02  --   --   --     Recent Results (from the past 240 hour(s))  Blood Culture (routine x 2)     Status: None (Preliminary result)   Collection Time: 04/04/18  4:25 PM  Result Value Ref Range Status   Specimen Description BLOOD RIGHT ANTECUBITAL  Final   Special Requests   Final    BOTTLES DRAWN AEROBIC AND ANAEROBIC Blood Culture adequate volume   Culture   Final    NO GROWTH 4 DAYS Performed at [facility], [address], [place], [place] [address]    Report Status PENDING  Incomplete  Blood Culture (routine x 2)     Status: None (Preliminary result)   Collection Time: 04/04/18  5:20 PM  Result Value Ref Range Status   Specimen Description BLOOD LEFT HAND  Final   Special Requests   Final    BOTTLES DRAWN AEROBIC ONLY Blood Culture results may not be optimal due to an inadequate volume of blood received in culture bottles   Culture   Final    NO GROWTH 4 DAYS Performed at [facility], [address], [place], [place] [address]    Report Status PENDING  Incomplete  Urine culture     Status: None   Collection Time: 04/04/18  7:45 PM  Result Value Ref Range Status   Specimen Description URINE, RANDOM  Final   Special Requests NONE  Final   Culture   Final    NO GROWTH Performed at [facility], [address], [place], [place] [address]    Report  Status 04/06/2018 FINAL  Final  Culture, blood (routine x 2)     Status: None (Preliminary result)   Collection Time: 04/05/18 12:54 AM  Result Value Ref Range Status   Specimen Description BLOOD RIGHT HAND  Final   Special Requests   Final    BOTTLES DRAWN AEROBIC ONLY Blood Culture adequate volume   Culture   Final    NO GROWTH 3 DAYS Performed at [facility], [address], [place], [place] [address]    Report Status PENDING  Incomplete  Culture, blood (routine x 2)     Status: None (Preliminary result)   Collection Time: 04/05/18  1:11 AM  Result Value Ref Range Status   Specimen Description BLOOD LEFT A-LINE  Final   Special Requests   Final    BOTTLES DRAWN AEROBIC ONLY Blood Culture adequate volume   Culture   Final    NO GROWTH 3 DAYS Performed at [facility], [address] [place], [place] [address]    Report Status PENDING  Incomplete  MRSA PCR Screening     Status: None   Collection Time: 04/05/18  4:04 AM  Result Value Ref Range Status   MRSA by PCR NEGATIVE NEGATIVE Final    Comment:        The  GeneXpert MRSA Assay (FDA approved for NASAL specimens only), is one component of a comprehensive MRSA colonization surveillance program. It is not intended to diagnose MRSA infection nor to guide or monitor treatment for MRSA infections. Performed at [facility], [address], [place], [place] [address]   C difficile quick scan w PCR reflex     Status: None   Collection Time: 04/07/18  1:16 PM  Result Value Ref Range Status   C Diff antigen NEGATIVE NEGATIVE Final   C Diff toxin NEGATIVE NEGATIVE Final   C Diff interpretation No C. difficile detected.  Final    Comment: Performed at [facility], [address] [place], [place] [address]  Gastrointestinal Panel by PCR , Stool     Status: None   Collection Time: 04/07/18  1:16 PM  Result Value Ref Range Status   Campylobacter species NOT DETECTED NOT DETECTED Final   Plesimonas  shigelloides NOT DETECTED NOT DETECTED Final   Salmonella species NOT DETECTED NOT DETECTED Final   Yersinia enterocolitica NOT DETECTED NOT DETECTED Final   Vibrio species NOT DETECTED NOT DETECTED Final   Vibrio cholerae NOT DETECTED NOT DETECTED Final   Enteroaggregative E coli (EAEC) NOT DETECTED NOT DETECTED Final   Enteropathogenic E coli (EPEC) NOT DETECTED NOT DETECTED Final   Enterotoxigenic E coli (ETEC) NOT DETECTED NOT DETECTED Final   Shiga like toxin producing E coli (STEC) NOT DETECTED NOT DETECTED Final   Shigella/Enteroinvasive E coli (EIEC) NOT DETECTED NOT DETECTED Final   Cryptosporidium NOT DETECTED NOT DETECTED Final   Cyclospora cayetanensis NOT DETECTED NOT DETECTED Final   Entamoeba histolytica NOT DETECTED NOT DETECTED Final   Giardia lamblia NOT DETECTED NOT DETECTED Final   Adenovirus F40/41 NOT DETECTED NOT DETECTED Final   Astrovirus NOT DETECTED NOT DETECTED Final   Norovirus GI/GII NOT DETECTED NOT DETECTED Final   Rotavirus A NOT DETECTED NOT DETECTED Final   Sapovirus (I, II, IV, and V) NOT DETECTED NOT DETECTED Final    Comment: Performed at [facility], [address], [place], [place] [address]         Radiology Studies: No results found.      Scheduled Meds: . aspirin  81 mg Oral Daily  . atorvastatin  40 mg Oral q1800  . carvedilol  12.5 mg Oral BID WC  . diphenoxylate-atropine  10 mL Oral QID  . feeding supplement (ENSURE ENLIVE)  237 mL Oral TID BM  . heparin  5,000 Units Subcutaneous Q8H  . hydroxychloroquine  200 mg Oral BID  . ipratropium-albuterol  3 mL Nebulization TID  . mouth rinse  15 mL Mouth Rinse BID  . multivitamin with minerals  1 tablet Oral Daily  . nicotine  7 mg Transdermal Daily  . pantoprazole  40 mg Oral Q1200  . pneumococcal 23 valent vaccine  0.5 mL Intramuscular Tomorrow-1000  . predniSONE  10 mg Oral Q breakfast   Continuous Infusions: . sodium chloride Stopped (04/05/18 0154)  . lactated  ringers 50 mL/hr at 04/09/18 0559     LOS: 5 days    Time spent: over 30 min    Lacretia Nicks, MD [facility] Pager [phone]  If 7PM-7AM, please contact night-coverage [email or web address] Password [email or web address] 04/09/2018, 3:30 PM

## 2018-04-09 NOTE — Plan of Care (Signed)
  Problem: Coping: Goal: Level of anxiety will decrease Outcome: Completed/Met   Problem: Pain Managment: Goal: General experience of comfort will improve Outcome: Completed/Met

## 2018-04-10 ENCOUNTER — Inpatient Hospital Stay (HOSPITAL_COMMUNITY): Payer: Medicaid Other

## 2018-04-10 DIAGNOSIS — I639 Cerebral infarction, unspecified: Secondary | ICD-10-CM

## 2018-04-10 LAB — BASIC METABOLIC PANEL
Anion gap: 8 (ref 5–15)
BUN: 12 mg/dL (ref 6–20)
CO2: 31 mmol/L (ref 22–32)
Calcium: 8.3 mg/dL — ABNORMAL LOW (ref 8.9–10.3)
Chloride: 103 mmol/L (ref 101–111)
Creatinine, Ser: 0.71 mg/dL (ref 0.61–1.24)
GFR calc Af Amer: >60 mL/min (ref >60)
GFR calc non Af Amer: >60 mL/min (ref >60)
Glucose, Bld: 106 mg/dL — ABNORMAL HIGH (ref 65–99)
Potassium: 3.8 mmol/L (ref 3.5–5.1)
Sodium: 142 mmol/L (ref 135–145)

## 2018-04-10 LAB — CULTURE, BLOOD (ROUTINE X 2)
Culture: NO GROWTH
Culture: NO GROWTH
Special Requests: ADEQUATE
Special Requests: ADEQUATE

## 2018-04-10 LAB — CBC
HCT: 33.3 % — ABNORMAL LOW (ref 39.0–52.0)
Hemoglobin: 10.4 g/dL — ABNORMAL LOW (ref 13.0–17.0)
MCH: 28.4 pg (ref 26.0–34.0)
MCHC: 31.2 g/dL (ref 30.0–36.0)
MCV: 91 fL (ref 78.0–100.0)
Platelets: 199 10*3/uL (ref 150–400)
RBC: 3.66 MIL/uL — ABNORMAL LOW (ref 4.22–5.81)
RDW: 14.5 % (ref 11.5–15.5)
WBC: 9.3 10*3/uL (ref 4.0–10.5)

## 2018-04-10 LAB — HEPATIC FUNCTION PANEL
ALT: 31 U/L (ref 17–63)
AST: 24 U/L (ref 15–41)
Albumin: 2.1 g/dL — ABNORMAL LOW (ref 3.5–5.0)
Alkaline Phosphatase: 56 U/L (ref 38–126)
Bilirubin, Direct: 0.2 mg/dL (ref 0.1–0.5)
Indirect Bilirubin: 0.5 mg/dL (ref 0.3–0.9)
Total Bilirubin: 0.7 mg/dL (ref 0.3–1.2)
Total Protein: 5.3 g/dL — ABNORMAL LOW (ref 6.5–8.1)

## 2018-04-10 LAB — MAGNESIUM: Magnesium: 1.7 mg/dL (ref 1.7–2.4)

## 2018-04-10 MED ORDER — POLYETHYLENE GLYCOL 3350 17 G PO PACK
17.0000 g | PACK | Freq: Every day | ORAL | Status: DC
  Administered 2018-04-10 – 2018-04-12 (×3): 17 g via ORAL
  Filled 2018-04-10 (×3): qty 1

## 2018-04-10 MED ORDER — CLOPIDOGREL BISULFATE 75 MG PO TABS
75.0000 mg | ORAL_TABLET | Freq: Every day | ORAL | Status: DC
  Administered 2018-04-10 – 2018-04-12 (×3): 75 mg via ORAL
  Filled 2018-04-10 (×3): qty 1

## 2018-04-10 MED ORDER — IOPAMIDOL (ISOVUE-370) INJECTION 76%
50.0000 mL | Freq: Once | INTRAVENOUS | Status: AC | PRN
  Administered 2018-04-10: 50 mL via INTRAVENOUS

## 2018-04-10 MED ORDER — STROKE: EARLY STAGES OF RECOVERY BOOK
Freq: Once | Status: DC
  Filled 2018-04-10: qty 1

## 2018-04-10 NOTE — Consult Note (Signed)
Referring Physician: Dr. Florene Glen    Chief Complaint: New leg weakness  HPI: [name] is an 62 y.o. male with a complex PMHx including prior stroke, CHF, prior MI, tobacco abuse, anemia and RA who presented to [facility] on 6/3 for evaluation of generalized profound weakness, which he woke up with that morning. He was hypotensive in the ED with electrolyte abnormalities. Today, Dr. Marcelline Deist noted new RLE weakness. An MRI was ordered, revealing two punctate acute to subacute ischemic infarctions, one in the left basal ganglia and one in the left centrum semiovale.   The patient takes ASA 81 mg qd and atorvastatin as an outpatient.   LSN: Unknown tPA Given: No: Unknown time of onset.   Past Medical History:  Diagnosis Date  . AAA (abdominal aortic aneurysm) ([place])   . Arthritis   . CAD in native artery    a. h/o MI x 3 s/p CABG x 4 (1993 with Servando Snare). b. LHC 01/2017 with severe native disease, 2 grafts occluded.  . Carotid artery disease ([place])    a. s/p R CEA 11/2016.  [name] Chronic combined systolic and diastolic CHF (congestive heart failure) ([place])   . GERD (gastroesophageal reflux disease)   . ILD (interstitial lung disease) ([place])   . Lung nodule   . MI (myocardial infarction) ([place])    x 3   . Normocytic anemia   . NSVT (nonsustained ventricular tachycardia) ([place])   . Pancreatitis    1/19  . Pneumonia 11/2017  . RA (rheumatoid arthritis) ([place])   . Stroke ([place])   . Tobacco abuse     Past Surgical History:  Procedure Laterality Date  . CARDIAC CATHETERIZATION    . CAROTID ENDARTERECTOMY Left   . Heart Bypass  10/1992  . LEFT HEART CATH AND CORS/GRAFTS ANGIOGRAPHY N/A 03/01/2017   Procedure: Left Heart Cath and Cors/Grafts Angiography;  Surgeon: Belva Crome, MD;  Location: [facility];  Service: Cardiovascular;  Laterality: N/A;  . Throat Biopsy     Cat scratch fever    Family History  Problem Relation Age of Onset  . Heart murmur Mother   . Heart disease Mother   .  Heart attack Mother   . Suicidality Father   . Heart attack Father   . Stroke Brother   . Heart attack Brother   . Heart attack Maternal Grandmother   . Cancer Maternal Grandmother        unknown type  . Lung cancer Maternal Aunt   . Lung cancer Maternal Uncle   . Cancer Maternal Aunt        unknown cancer  . Heart attack Brother    Social History:  reports that he has been smoking cigarettes.  He has a 23.50 pack-year smoking history. He has never used smokeless tobacco. He reports that he does not drink alcohol or use drugs.  Allergies: No Known Allergies  Medications:  Scheduled: .  stroke: mapping our early stages of recovery book   Does not apply Once  . aspirin  81 mg Oral Daily  . atorvastatin  40 mg Oral q1800  . carvedilol  3.125 mg Oral BID WC  . feeding supplement (ENSURE ENLIVE)  237 mL Oral TID BM  . heparin  5,000 Units Subcutaneous Q8H  . hydroxychloroquine  200 mg Oral BID  . ipratropium-albuterol  3 mL Nebulization TID  . mouth rinse  15 mL Mouth Rinse BID  . multivitamin with minerals  1 tablet Oral Daily  .  nicotine  7 mg Transdermal Daily  . pantoprazole  40 mg Oral Q1200  . polyethylene glycol  17 g Oral Daily  . predniSONE  10 mg Oral Q breakfast   Continuous: . sodium chloride Stopped (04/05/18 0154)  . lactated ringers 50 mL/hr at 04/10/18 0657    ROS: Does not endorse headache, vision changes or speech deficit. Other ROS as per HPI.   Physical Examination: Blood pressure 129/79, pulse 80, temperature 98 F (36.7 C), temperature source Oral, resp. rate 20, height _0  (1.854 m), weight 98 kg (216 lb), SpO2 97 %.  HEENT: [place]/AT Lungs: Grossly audible wheezing at the bedside. NAD.  Ext: No peripheral edema. Old graft harvest scar noted.   Neurologic Examination: Mental Status: Awake with mildly decreased level of alertness. Orients correctly except for "Friday". Appears fatigued with blunted affect. Speech fluent without evidence of aphasia.   Able to follow all commands without difficulty. Cranial Nerves: II:  Visual fields intact except for an instance of extinction on the right with one of 2 trials testing DSS. Visually fixates and tracks normally.  III,IV, VI: EOMI without nystagmus.  V,VII: Left palpebral fissure larger than on right with slightly weaker blink on left. Mild left facial droop. Facial temp sensation equal bilaterally VIII: hearing intact to voice IX,X: No hypophonia or hoarseness XI: Decreased shoulder shrug on the left XII: midline tongue extension  Motor: RUE: 4+/5 LUE: 4-/5 RLE: 2/5 in flexion and extension LLE: 3/5 Sensory: FT intact proximally x 4. + extinction to RLE with DSS. Temp intact proximally x 4 Deep Tendon Reflexes:  Mildly hypoactive reflexes all 4 extremities without definite asymmetry Plantars: Right: downgoing   Left: downgoing Cerebellar: Slow without ataxia on performance of FNF bilaterally Gait: Deferred  Results for orders placed or performed during the hospital encounter of 04/04/18 (from the past 48 hour(s))  Basic metabolic panel     Status: Abnormal   Collection Time: 04/09/18  6:31 AM  Result Value Ref Range   Sodium 142 135 - 145 mmol/L   Potassium 3.7 3.5 - 5.1 mmol/L   Chloride 105 101 - 111 mmol/L   CO2 30 22 - 32 mmol/L   Glucose, Bld 97 65 - 99 mg/dL   BUN 14 6 - 20 mg/dL   Creatinine, Ser 0.73 0.61 - 1.24 mg/dL   Calcium 8.4 (L) 8.9 - 10.3 mg/dL   GFR calc non Af Amer >60 >60 mL/min   GFR calc Af Amer >60 >60 mL/min    Comment: (NOTE) The eGFR has been calculated using the CKD EPI equation. This calculation has not been validated in all clinical situations. eGFR's persistently <60 mL/min signify possible Chronic Kidney Disease.    Anion gap 7 5 - 15    Comment: Performed at [facility] [address] [place], [place] [address]  Magnesium     Status: None   Collection Time: 04/09/18  6:31 AM  Result Value Ref Range   Magnesium 1.8 1.7 - 2.4 mg/dL     Comment: Performed at [facility] [address], [place] [address]  CBC     Status: Abnormal   Collection Time: 04/09/18  6:31 AM  Result Value Ref Range   WBC 9.1 4.0 - 10.5 K/uL   RBC 3.50 (L) 4.22 - 5.81 MIL/uL   Hemoglobin 10.1 (L) 13.0 - 17.0 g/dL   HCT 32.4 (L) 39.0 - 52.0 %   MCV 92.6 78.0 - 100.0 fL  MCH 28.9 26.0 - 34.0 pg   MCHC 31.2 30.0 - 36.0 g/dL   RDW 14.7 11.5 - 15.5 %   Platelets 190 150 - 400 K/uL    Comment: Performed at [facility], [place] [address], [place], [place] [address]  Hepatic function panel     Status: Abnormal   Collection Time: 04/09/18  6:31 AM  Result Value Ref Range   Total Protein 5.5 (L) 6.5 - 8.1 g/dL   Albumin 2.1 (L) 3.5 - 5.0 g/dL   AST 33 15 - 41 U/L   ALT 34 17 - 63 U/L   Alkaline Phosphatase 58 38 - 126 U/L   Total Bilirubin 0.8 0.3 - 1.2 mg/dL   Bilirubin, Direct 0.1 0.1 - 0.5 mg/dL   Indirect Bilirubin 0.7 0.3 - 0.9 mg/dL    Comment: Performed at [facility] [address] [place], [place] [address]  Basic metabolic panel     Status: Abnormal   Collection Time: 04/10/18  7:24 AM  Result Value Ref Range   Sodium 142 135 - 145 mmol/L   Potassium 3.8 3.5 - 5.1 mmol/L   Chloride 103 101 - 111 mmol/L   CO2 31 22 - 32 mmol/L   Glucose, Bld 106 (H) 65 - 99 mg/dL   BUN 12 6 - 20 mg/dL   Creatinine, Ser 0.71 0.61 - 1.24 mg/dL   Calcium 8.3 (L) 8.9 - 10.3 mg/dL   GFR calc non Af Amer >60 >60 mL/min   GFR calc Af Amer >60 >60 mL/min    Comment: (NOTE) The eGFR has been calculated using the CKD EPI equation. This calculation has not been validated in all clinical situations. eGFR's persistently <60 mL/min signify possible Chronic Kidney Disease.    Anion gap 8 5 - 15    Comment: Performed at [facility] [address], [place], [place] [address]  Magnesium     Status: None   Collection Time: 04/10/18  7:24 AM  Result Value Ref Range   Magnesium 1.7 1.7 - 2.4 mg/dL    Comment: Performed at  [facility] [address], [place], [place] [address]  CBC     Status: Abnormal   Collection Time: 04/10/18  7:24 AM  Result Value Ref Range   WBC 9.3 4.0 - 10.5 K/uL   RBC 3.66 (L) 4.22 - 5.81 MIL/uL   Hemoglobin 10.4 (L) 13.0 - 17.0 g/dL   HCT 33.3 (L) 39.0 - 52.0 %   MCV 91.0 78.0 - 100.0 fL   MCH 28.4 26.0 - 34.0 pg   MCHC 31.2 30.0 - 36.0 g/dL   RDW 14.5 11.5 - 15.5 %   Platelets 199 150 - 400 K/uL    Comment: Performed at [facility], [place] [address] [place], [place] [address]  Hepatic function panel     Status: Abnormal   Collection Time: 04/10/18  7:24 AM  Result Value Ref Range   Total Protein 5.3 (L) 6.5 - 8.1 g/dL   Albumin 2.1 (L) 3.5 - 5.0 g/dL   AST 24 15 - 41 U/L   ALT 31 17 - 63 U/L   Alkaline Phosphatase 56 38 - 126 U/L   Total Bilirubin 0.7 0.3 - 1.2 mg/dL   Bilirubin, Direct 0.2 0.1 - 0.5 mg/dL   Indirect Bilirubin 0.5 0.3 - 0.9 mg/dL    Comment: Performed at [facility] [address], [place], [place] [address]  Mr Brain Wo Contrast  Result Date: 04/10/2018 CLINICAL DATA:  Neuro deficits, subacute.  Weakness. EXAM: MRI HEAD WITHOUT CONTRAST TECHNIQUE: Multiplanar, multiecho pulse sequences of the brain and surrounding structures were obtained without intravenous contrast. COMPARISON:  CT head without contrast 04/04/2018. FINDINGS: Brain: 2 punctate foci of restricted diffusion are compatible with acute nonhemorrhagic infarcts. These involve the medial left lentiform nucleus and posterior left centrum semi ovale. No acute hemorrhage or mass lesion is present. Remote infarcts of the right right radiata are noted. The ventricles are proportionate to the degree of atrophy. No significant extra-axial fluid collection is present. Vascular: Flow is present in the major intracranial arteries. Skull and upper cervical spine: The skull base is within normal limits. The craniocervical junction is normal. Sinuses/Orbits: The paranasal sinuses are clear.  There is some fluid in the inferior mastoid air cells bilaterally. No obstructing nasopharyngeal lesion is present. Globes and orbits are within normal limits. IMPRESSION: 1. Punctate nonhemorrhagic infarct involving the medial left basal ganglia. 2. Punctate nonhemorrhagic white matter infarct in the posterior left centrum semi ovale. 3. Remote white matter infarcts in the right corona radiata. This could be a watershed insult. 4. Moderate atrophy and white matter disease otherwise compatible with chronic microvascular ischemia. Electronically Signed   By: San Morelle M.D.   On: 04/10/2018 15:50    Assessment: 62 y.o. male with new onset of RLE weakness secondary to subacute left basal ganglia stroke, superimposed upon global weakness/fatigue most likely multifactorial secondary to electrolyte disturbance, deconditioning and CHF 1. MRI reveals a punctate ischemic infarct involving the medial left basal ganglia and a second punctate ischemic white matter infarct in the posterior left centrum semi ovale. Also noted on MRI are remote linearly-arrayed white matter infarcts in the right corona radiata, suggestive of old watershed insult. Moderate atrophy chronic microvascular ischemic changes are also noted. 2. Echocardiogram reveals low EF of 30% to 35% and severe hypokinesis to akinesis of the entire inferolateral, inferior, and inferoseptal myocardium. This increases the risk of cardioembolic stroke, but the new strokes are in a single vascular territory (MCA) and also appear most consistent with being secondary to small vessel disease (lipohyalinosis, HTN) than emboli.  3. Exam reveals LUE weakness and R worse than L lower extremity weakness. 3. Stroke Risk Factors - CAD, carotid artery disease, prior stroke and CHF  Plan: 1. CTA of head and neck 2. Add Plavix to ASA 3. PT consult, OT consult, Speech consult 4. Continue atorvastatin 5. Out of permissive HTN time window. Manage BP towards goal  of normalization in the context of his CHF and other medical comorbidities.  6. I have strongly recommended smoking cessation to the patient.  7. Telemetry monitoring 8. Frequent neuro checks  _0  signed: Dr. Kerney Elbe  04/10/2018, 4:10 PM

## 2018-04-10 NOTE — Progress Notes (Signed)
This nurse assumed care of pt.  Pt laying in bed, appears comfortable and denies pain, sob or discomfort. Pt oriented to unit environment, no acute distress noted, bed low call light in reach.  [id number]

## 2018-04-10 NOTE — Plan of Care (Signed)
  Problem: Respiratory: Goal: Ability to maintain adequate ventilation will improve Outcome: Completed/Met

## 2018-04-10 NOTE — Progress Notes (Signed)
Report attempted to the nurse on 3W, but was unable to reach her. Secretary said she will have her call me back.

## 2018-04-10 NOTE — Progress Notes (Signed)
PROGRESS NOTE    [name]  VVK:[id number] DOB: [birth year] DOA: 04/04/2018 PCP: [name], PA-C   Brief Narrative:  62 y.o.WM PMHx CVA, AAA ,CAD, Chronic combined systolic and diastolic CHF(April 2018 EF 35 to 40%),MI,  ILD, Lung nodule,  (followed by Dr. [name] and on chronic prednisone),Tobacco abuse, MI, NSVT, Normocytic anemia,Pancreatitis, RA (rheumatoid arthritis),  Presented to [facility] ED 6/3 due to generalized weakness. Per sister, he was normal the day prior and when he woke up 6/3, he had profound weakness. At baseline, pt is fairly sedentary. He lives with sister who is primary caregiver. Sister states he can perform most ADLs independently but does occasionally need help.  On day of presentation, pt could barely get out of bed and / or into his recliner and due to fear of him falling while trying, sister called EMS.  In ED, he was found to have hypotension (SBP in 70's), hyponatremia, hyperkalemia, AKI (SCr up to 8). He remained hypotensive despite 4L IVF and was subsequently started on norepinephrine. CT of the head was negative. He had bladder scan that showed ~500cc urine. After foley was placed, he has had roughly 900cc UOP since arrival in ED about 7 hours ago.  He denies any recent fevers/chills/sweats, headaches, chest pain, SOB, N/V/D, abd pain, myalgias, exposure to known sick contacts. He has had dry cough for 3 - 4 days which has not changed. He has been taking all of his medications as prescribed including anti-hypertensives. He has been eating OK and drinking fairly well but per sister, has not been drinking as much water as he should (though does drink lots of tea and soda).  Assessment & Plan:   Principal Problem:   Shock (HCC) Active Problems:   CAD (coronary artery disease)   ILD (interstitial lung disease) (HCC)   Chronic combined systolic and diastolic CHF (congestive heart failure) (HCC)   Leukocytosis   High anion gap metabolic  acidosis   Acute kidney injury (HCC)   Sacral ulcer, with unspecified severity (HCC)  Hypovolemic shock  -Felt to be secondary to hypovolemia + component of adrenal insufficiency although the inciting cause for his hypovolemia is not immediately clear to me on chart review and discussion with him.  His opiates were increased on 5/31, but his sister administers this and notes he did not have any extra doses.  Infection seems to have been ruled out at this point with negative blood and urine cultures (sacral wounds don't appear infected).  Now on his home steroid dose after stress dose steroids.  He has diarrhea, but this seems to have started after his hospitalization.  - blood pressure better today  Diarrhea - negative GI path and C diff - improved with lomotil, actually now without BM since yesterday.  D/c lomotil and imodium and start miralax daily, follow  History of CVA: with residual L sided weakness, however, today had what seemed like more weakness on his right lower extremity.  This was not totally consistent on exam and seemed to improve on repeat, but will follow up MRI.  ILD due to RA and mold exposure - Followed by Dr. [name] PCCM - On chronic steroids.  Home dose prednisone resumed. 10 mg daily (home dose) - Plaquenil 200 mg BID - DuoNeb TID - Albuterol PRN   Chronic respiratory failure with hypoxia (home O2; 2 L O2 nocturnally) -Multifactorial to include ILD, COPD, continued tobacco abuse -Patient counseled on need to absolutely discontinue smoking. -Titrate O2 to maintain SPO2  89 to 93% - on O2 right now, will wean daytime O2 as tolerated (doing well on RA today)  COPD/Tobacco abuse - Nicotine patch   Chronic systolic and diastolic CHF - 6/4 Echocardiogram: LVEF= 30 to 35%, grade 2 diastolic dysfunction - Decrease coreg to 3.125 mg BID - holding spironolactone, entresto, lasix at this time -Strict in and out -Daily weight - Transfuse for hemoglobin  <8  Sinus Pause  Bradycardia: stable with decreased carvedilol.  No recurrent pauses on telemetry.  Elevated troponin  Demand ischemia:  Echo with EF 30-35% (slightly decreased from EF 01/2017 with 35-40%).    -Felt secondary to stress (Type 2 NSTEMI with renal failure contributing) - would recommend outpatient cardiology follow up - of note, with previous cath in 01/2017, recommended considering AICD if no improvement in LV function with medical therapy - continue asa and atorvastatin  Hx MI -ASA, atorvastatin  Essential HTN -coreg (decreased dose with soft BP and bradycardia) - holding spironolactone, entresto, lasix  CAD -S/P CABG -See CHF  AAA: noted  Hx NSVT: noted, none noted.  As above, likely needs AICD at some point.  RIGHTStage II buttocks ulcer - Wound care consult - Doesn't appear infected at this time.   - nutrition consult  Acute kidney injury - Due to shock above, peaked ~8, now back to baseline  Relative adrenal insufficiency  --Home dose steroids restarted.  Altered mental status -Most likely secondary to hypoxia, ICU delirium, hypotension and shock. - Resolved -Minimize sedating medication.  Goals of care -PT/OT consult: Patient with acute on chronic respiratory failure secondary to ILD, RA, tobacco abuse, COPD evaluate for CIR vs SNF  DVT prophylaxis: heparin Code Status: full code Family Communication: sister at bedside Disposition Plan: pending improvement - likely SNF once ready   Consultants:   PCCM  Procedures:  CT head 6/3 > no acute process. CXR 6/3 > no acute process. Echo 6/4 >LVEF 30-35%, Grade 2 DD  Antimicrobials: Anti-infectives (From admission, onward)   Start     Dose/Rate Route Frequency Ordered Stop   04/07/18 1200  hydroxychloroquine (PLAQUENIL) tablet 200 mg     200 mg Oral 2 times daily 04/07/18 1119     04/06/18 1700  vancomycin (VANCOCIN) IVPB 1000 mg/200 mL premix  Status:  Discontinued     1,000  mg 200 mL/hr over 60 Minutes Intravenous Every 48 hours 04/04/18 1653 04/06/18 0708   04/06/18 0900  piperacillin-tazobactam (ZOSYN) IVPB 3.375 g  Status:  Discontinued     3.375 g 12.5 mL/hr over 240 Minutes Intravenous Every 8 hours 04/06/18 0708 04/06/18 0756   04/06/18 0800  vancomycin (VANCOCIN) IVPB 750 mg/150 ml premix  Status:  Discontinued     750 mg 150 mL/hr over 60 Minutes Intravenous Every 12 hours 04/06/18 0708 04/06/18 0756   04/05/18 0100  piperacillin-tazobactam (ZOSYN) IVPB 2.25 g  Status:  Discontinued     2.25 g 100 mL/hr over 30 Minutes Intravenous Every 8 hours 04/04/18 1653 04/06/18 0708   04/04/18 2145  vancomycin (VANCOCIN) IVPB 1000 mg/200 mL premix     1,000 mg 200 mL/hr over 60 Minutes Intravenous  Once 04/04/18 2137 04/04/18 2255   04/04/18 1615  piperacillin-tazobactam (ZOSYN) IVPB 3.375 g     3.375 g 100 mL/hr over 30 Minutes Intravenous  Once 04/04/18 1612 04/04/18 1856   04/04/18 1615  vancomycin (VANCOCIN) IVPB 1000 mg/200 mL premix     1,000 mg 200 mL/hr over 60 Minutes Intravenous  Once 04/04/18 1612 04/04/18  1857     Subjective: Some pain in his bottom, otherwise doing ok.  Objective: Vitals:   04/10/18 0524 04/10/18 0654 04/10/18 1201 04/10/18 1425  BP: 138/85  129/79   Pulse: 75  80   Resp: 18  20   Temp: 98.3 F (36.8 C)  98 F (36.7 C)   TempSrc: Oral  Oral   SpO2: 93% 92% 97% 97%  Weight: 98 kg (216 lb)     Height:        Intake/Output Summary (Last 24 hours) at 04/10/2018 1552 Last data filed at 04/10/2018 1344 Gross per 24 hour  Intake 1394.5 ml  Output 1100 ml  Net 294.5 ml   Filed Weights   04/08/18 0406 04/09/18 0500 04/10/18 0524  Weight: 95.8 kg (211 lb 3.2 oz) 98.9 kg (218 lb) 98 kg (216 lb)    Examination:  General: No acute distress. Cardiovascular: Heart sounds show a regular rate, and rhythm. No gallops or rubs. No murmurs. No JVD. Lungs: Clear to auscultation bilaterally with good air movement. No rales,  rhonchi or wheezes. Abdomen: Soft, nontender, nondistended with normal active bowel sounds. No masses. No hepatosplenomegaly. Neurological: Alert and oriented 3. L sided weakness and facial droop.  Today, RLE seemed weaker on exam, but this was somewhat inconsistent. Skin: Warm and dry. No rashes or lesions. Extremities: No clubbing or cyanosis. No edema.  Psychiatric: Mood and affect are normal. Insight and judgment are appropriate.  Data Reviewed: I have personally reviewed following labs and imaging studies  CBC: Recent Labs  Lab 04/04/18 1505  04/06/18 0445 04/07/18 0317 04/08/18 0709 04/09/18 0631 04/10/18 0724  WBC 14.6*   < > 9.6 10.8* 8.5 9.1 9.3  NEUTROABS 12.8*  --   --   --   --   --   --   HGB 10.9*   < > 10.6* 10.9* 10.4* 10.1* 10.4*  HCT 33.1*   < > 32.6* 33.8* 33.3* 32.4* 33.3*  MCV 89.7   < > 90.6 89.2 93.3 92.6 91.0  PLT 192   < > 184 212 184 190 199   < > = values in this interval not displayed.   Basic Metabolic Panel: Recent Labs  Lab 04/05/18 0442  04/06/18 0445 04/07/18 0317 04/08/18 0709 04/09/18 0631 04/10/18 0724  NA 139   < > 142 145 145 142 142  K 4.8   < > 4.3 3.9 3.8 3.7 3.8  CL 107   < > 108 111 111 105 103  CO2 17*   < > 25 25 23 30 31   GLUCOSE 100*   < > 119* 119* 89 97 106*  BUN 62*   < > 35* 24* 19 14 12   CREATININE 4.90*   < > 1.62* 0.91 0.80 0.73 0.71  CALCIUM 7.4*   < > 8.2* 8.6* 8.5* 8.4* 8.3*  MG 1.9  --  2.2 2.1 1.8 1.8 1.7  PHOS 5.0*  --  2.4*  --   --   --   --    < > = values in this interval not displayed.   GFR: Estimated Creatinine Clearance: 117.9 mL/min (by C-G formula based on SCr of 0.71 mg/dL). Liver Function Tests: Recent Labs  Lab 04/04/18 1505 04/07/18 0317 04/09/18 0631 04/10/18 0724  AST 92* 67* 33 24  ALT 34 45 34 31  ALKPHOS 76 64 58 56  BILITOT 1.1 0.6 0.8 0.7  PROT 6.3* 6.1* 5.5* 5.3*  ALBUMIN 2.7* 2.3* 2.1* 2.1*  No results for input(s): LIPASE, AMYLASE in the last 168 hours. No results for  input(s): AMMONIA in the last 168 hours. Coagulation Profile: Recent Labs  Lab 04/04/18 1505  INR 1.05   Cardiac Enzymes: Recent Labs  Lab 04/05/18 0111 04/05/18 0442 04/07/18 0317  TROPONINI 0.28* 0.86* 0.48*   BNP (last 3 results) No results for input(s): PROBNP in the last 8760 hours. HbA1C: No results for input(s): HGBA1C in the last 72 hours. CBG: Recent Labs  Lab 04/05/18 0028 04/05/18 1117 04/05/18 1548  GLUCAP 95 139* 121*   Lipid Profile: No results for input(s): CHOL, HDL, LDLCALC, TRIG, CHOLHDL, LDLDIRECT in the last 72 hours. Thyroid Function Tests: No results for input(s): TSH, T4TOTAL, FREET4, T3FREE, THYROIDAB in the last 72 hours. Anemia Panel: No results for input(s): VITAMINB12, FOLATE, FERRITIN, TIBC, IRON, RETICCTPCT in the last 72 hours. Sepsis Labs: Recent Labs  Lab 04/04/18 1617 04/04/18 1756 04/05/18 0111 04/05/18 0442 04/06/18 0445  PROCALCITON  --   --  1.69 2.37 0.70  LATICACIDVEN 2.27* 1.02  --   --   --     Recent Results (from the past 240 hour(s))  Blood Culture (routine x 2)     Status: None   Collection Time: 04/04/18  4:25 PM  Result Value Ref Range Status   Specimen Description BLOOD RIGHT ANTECUBITAL  Final   Special Requests   Final    BOTTLES DRAWN AEROBIC AND ANAEROBIC Blood Culture adequate volume   Culture   Final    NO GROWTH 5 DAYS Performed at [facility], [address], [place], [place] [address]    Report Status 04/09/2018 FINAL  Final  Blood Culture (routine x 2)     Status: None   Collection Time: 04/04/18  5:20 PM  Result Value Ref Range Status   Specimen Description BLOOD LEFT HAND  Final   Special Requests   Final    BOTTLES DRAWN AEROBIC ONLY Blood Culture results may not be optimal due to an inadequate volume of blood received in culture bottles   Culture   Final    NO GROWTH 5 DAYS Performed at [facility], [address], [place], [place] [address]    Report Status 04/09/2018 FINAL   Final  Urine culture     Status: None   Collection Time: 04/04/18  7:45 PM  Result Value Ref Range Status   Specimen Description URINE, RANDOM  Final   Special Requests NONE  Final   Culture   Final    NO GROWTH Performed at [facility], [address], [place], [place] [address]    Report Status 04/06/2018 FINAL  Final  Culture, blood (routine x 2)     Status: None   Collection Time: 04/05/18 12:54 AM  Result Value Ref Range Status   Specimen Description BLOOD RIGHT HAND  Final   Special Requests   Final    BOTTLES DRAWN AEROBIC ONLY Blood Culture adequate volume   Culture   Final    NO GROWTH 5 DAYS Performed at [facility], [address], [place], [place] [address]    Report Status 04/10/2018 FINAL  Final  Culture, blood (routine x 2)     Status: None   Collection Time: 04/05/18  1:11 AM  Result Value Ref Range Status   Specimen Description BLOOD LEFT A-LINE  Final   Special Requests   Final    BOTTLES DRAWN AEROBIC ONLY Blood Culture adequate  volume   Culture   Final    NO GROWTH 5 DAYS Performed at [facility], [address], [place], [place] [address]    Report Status 04/10/2018 FINAL  Final  MRSA PCR Screening     Status: None   Collection Time: 04/05/18  4:04 AM  Result Value Ref Range Status   MRSA by PCR NEGATIVE NEGATIVE Final    Comment:        The GeneXpert MRSA Assay (FDA approved for NASAL specimens only), is one component of a comprehensive MRSA colonization surveillance program. It is not intended to diagnose MRSA infection nor to guide or monitor treatment for MRSA infections. Performed at [facility], [address], [place], [place] [address]   C difficile quick scan w PCR reflex     Status: None   Collection Time: 04/07/18  1:16 PM  Result Value Ref Range Status   C Diff antigen NEGATIVE NEGATIVE Final   C Diff toxin NEGATIVE NEGATIVE Final   C Diff interpretation No C. difficile detected.  Final    Comment:  Performed at [facility], [address] [place], [place] [address]  Gastrointestinal Panel by PCR , Stool     Status: None   Collection Time: 04/07/18  1:16 PM  Result Value Ref Range Status   Campylobacter species NOT DETECTED NOT DETECTED Final   Plesimonas shigelloides NOT DETECTED NOT DETECTED Final   Salmonella species NOT DETECTED NOT DETECTED Final   Yersinia enterocolitica NOT DETECTED NOT DETECTED Final   Vibrio species NOT DETECTED NOT DETECTED Final   Vibrio cholerae NOT DETECTED NOT DETECTED Final   Enteroaggregative E coli (EAEC) NOT DETECTED NOT DETECTED Final   Enteropathogenic E coli (EPEC) NOT DETECTED NOT DETECTED Final   Enterotoxigenic E coli (ETEC) NOT DETECTED NOT DETECTED Final   Shiga like toxin producing E coli (STEC) NOT DETECTED NOT DETECTED Final   Shigella/Enteroinvasive E coli (EIEC) NOT DETECTED NOT DETECTED Final   Cryptosporidium NOT DETECTED NOT DETECTED Final   Cyclospora cayetanensis NOT DETECTED NOT DETECTED Final   Entamoeba histolytica NOT DETECTED NOT DETECTED Final   Giardia lamblia NOT DETECTED NOT DETECTED Final   Adenovirus F40/41 NOT DETECTED NOT DETECTED Final   Astrovirus NOT DETECTED NOT DETECTED Final   Norovirus GI/GII NOT DETECTED NOT DETECTED Final   Rotavirus A NOT DETECTED NOT DETECTED Final   Sapovirus (I, II, IV, and V) NOT DETECTED NOT DETECTED Final    Comment: Performed at [facility], [address] [place], [place] [address]         Radiology Studies: No results found.      Scheduled Meds: . aspirin  81 mg Oral Daily  . atorvastatin  40 mg Oral q1800  . carvedilol  3.125 mg Oral BID WC  . feeding supplement (ENSURE ENLIVE)  237 mL Oral TID BM  . heparin  5,000 Units Subcutaneous Q8H  . hydroxychloroquine  200 mg Oral BID  . ipratropium-albuterol  3 mL Nebulization TID  . mouth rinse  15 mL Mouth Rinse BID  . multivitamin with minerals  1 tablet Oral Daily  . nicotine  7 mg Transdermal  Daily  . pantoprazole  40 mg Oral Q1200  . polyethylene glycol  17 g Oral Daily  . predniSONE  10 mg Oral Q breakfast   Continuous Infusions: . sodium chloride Stopped (04/05/18 0154)  . lactated ringers 50 mL/hr at 04/10/18 0657     LOS:  6 days    Time spent: over 30 min    Lacretia Nicks, MD [facility] Pager [phone]  If 7PM-7AM, please contact night-coverage [email or web address] Password [email or web address] 04/10/2018, 3:52 PM

## 2018-04-10 NOTE — Progress Notes (Signed)
CSW attempted to meet with patient to complete assessment; however, patient was off the floor to MRI. CSW alerted earlier by MD that patient is agreeable to SNF placement, so CSW will complete referral and follow up with patient tomorrow to discuss offers.  Blenda Nicely, [place] Clinical Social Worker [phone]

## 2018-04-11 DIAGNOSIS — I6330 Cerebral infarction due to thrombosis of unspecified cerebral artery: Secondary | ICD-10-CM

## 2018-04-11 LAB — CBC
HCT: 35.9 % — ABNORMAL LOW (ref 39.0–52.0)
HEMOGLOBIN: 11.3 g/dL — AB (ref 13.0–17.0)
MCH: 28.4 pg (ref 26.0–34.0)
MCHC: 31.5 g/dL (ref 30.0–36.0)
MCV: 90.2 fL (ref 78.0–100.0)
PLATELETS: 223 10*3/uL (ref 150–400)
RBC: 3.98 MIL/uL — AB (ref 4.22–5.81)
RDW: 14.2 % (ref 11.5–15.5)
WBC: 9.3 10*3/uL (ref 4.0–10.5)

## 2018-04-11 LAB — LIPID PANEL
CHOLESTEROL: 90 mg/dL (ref 0–200)
HDL: 21 mg/dL — AB (ref >40)
LDL CALC: 42 mg/dL (ref 0–99)
TRIGLYCERIDES: 135 mg/dL (ref <150)
Total CHOL/HDL Ratio: 4.3 RATIO
VLDL: 27 mg/dL (ref 0–40)

## 2018-04-11 LAB — BASIC METABOLIC PANEL
ANION GAP: 6 (ref 5–15)
BUN: 10 mg/dL (ref 6–20)
CALCIUM: 8.6 mg/dL — AB (ref 8.9–10.3)
CHLORIDE: 103 mmol/L (ref 101–111)
CO2: 29 mmol/L (ref 22–32)
Creatinine, Ser: 0.77 mg/dL (ref 0.61–1.24)
GFR calc non Af Amer: >60 mL/min (ref >60)
Glucose, Bld: 82 mg/dL (ref 65–99)
POTASSIUM: 4.3 mmol/L (ref 3.5–5.1)
Sodium: 138 mmol/L (ref 135–145)

## 2018-04-11 LAB — MAGNESIUM: Magnesium: 1.7 mg/dL (ref 1.7–2.4)

## 2018-04-11 LAB — HEMOGLOBIN A1C
HEMOGLOBIN A1C: 5.2 % (ref 4.8–5.6)
Mean Plasma Glucose: 102.54 mg/dL

## 2018-04-11 MED ORDER — COLLAGENASE 250 UNIT/GM EX OINT
TOPICAL_OINTMENT | Freq: Every day | CUTANEOUS | Status: DC
  Administered 2018-04-11 – 2018-04-12 (×2): via TOPICAL
  Filled 2018-04-11: qty 30

## 2018-04-11 MED ORDER — ATORVASTATIN CALCIUM 80 MG PO TABS
80.0000 mg | ORAL_TABLET | Freq: Every day | ORAL | Status: DC
  Administered 2018-04-11: 80 mg via ORAL
  Filled 2018-04-11: qty 1

## 2018-04-11 MED ORDER — ATORVASTATIN CALCIUM 40 MG PO TABS
40.0000 mg | ORAL_TABLET | Freq: Every day | ORAL | Status: DC
  Filled 2018-04-11: qty 1

## 2018-04-11 NOTE — Clinical Social Work Note (Signed)
Clinical Social Work Assessment  Patient Details  Name: [name] MRN: [id number] Date of Birth: [birth year]  Date of referral:  04/11/18               Reason for consult:  Facility Placement                Permission sought to share information with:  Facility Sport and exercise psychologist, Family Supports Permission granted to share information::  Yes, Verbal Permission Granted  Name::     Animal nutritionist::  SNF  Relationship::  Sister  Contact Information:     Housing/Transportation Living arrangements for the past 2 months:  [place] of Information:  Patient, Siblings Patient Interpreter Needed:  None Criminal Activity/Legal Involvement Pertinent to Current Situation/Hospitalization:  No - Comment as needed Significant Relationships:  Siblings Lives with:  Self, Siblings Do you feel safe going back to the place where you live?  Yes Need for family participation in patient care:  No (Coment)  Care giving concerns:  Patient from home with sister but will need short term rehab at discharge.   Social Worker assessment / plan:  CSW met with patient and sister at bedside to discuss recommendation for SNF. CSW explained SNF placement with Medicaid. CSW received permission to fax out and to discuss bed offers when received.  Employment status:  Retired Forensic scientist:  Medicaid In [place] PT Recommendations:  Mutual / Referral to community resources:  [place]  Patient/Family's Response to care:  Patient and sister agreeable to SNF placement.  Patient/Family's Understanding of and Emotional Response to Diagnosis, Current Treatment, and Prognosis:  Patient and sister acknowledged understanding that the patient would have to stay 30 days to be covered under his Medicaid. Patient's sister is hopeful that there will be a bed in [place], as she had a friend who had to go all the way to [place] before a bed could be  found.  Emotional Assessment Appearance:  Appears stated age Attitude/Demeanor/Rapport:  Engaged Affect (typically observed):  Pleasant Orientation:  Oriented to Self, Oriented to Place, Oriented to  Time, Oriented to Situation Alcohol / Substance use:  Not Applicable, Tobacco Use Psych involvement (Current and /or in the community):  No (Comment)  Discharge Needs  Concerns to be addressed:  Care Coordination Readmission within the last 30 days:  No Current discharge risk:  Physical Impairment, Dependent with Mobility Barriers to Discharge:  Continued Medical Work up   [employer], [place] 04/11/2018, 11:42 AM

## 2018-04-11 NOTE — Progress Notes (Signed)
PROGRESS NOTE    [name]  SVX:[id number] DOB: [birth year] DOA: 04/04/2018 PCP: [name], PA-C   Brief Narrative:  62 y.o.WM PMHx CVA, AAA ,CAD, Chronic combined systolic and diastolic CHF(April 2018 EF 35 to 40%),MI,  ILD, Lung nodule,  (followed by Dr. [name] and on chronic prednisone),Tobacco abuse, MI, NSVT, Normocytic anemia,Pancreatitis, RA (rheumatoid arthritis),  Presented to [facility] ED 6/3 due to generalized weakness. Per sister, he was normal the day prior and when he woke up 6/3, he had profound weakness. At baseline, pt is fairly sedentary. He lives with sister who is primary caregiver. Sister states he can perform most ADLs independently but does occasionally need help.  On day of presentation, pt could barely get out of bed and / or into his recliner and due to fear of him falling while trying, sister called EMS.  In ED, he was found to have hypotension (SBP in 70's), hyponatremia, hyperkalemia, AKI (SCr up to 8). He remained hypotensive despite 4L IVF and was subsequently started on norepinephrine. CT of the head was negative. He had bladder scan that showed ~500cc urine. After foley was placed, he has had roughly 900cc UOP since arrival in ED about 7 hours ago.  He denies any recent fevers/chills/sweats, headaches, chest pain, SOB, N/V/D, abd pain, myalgias, exposure to known sick contacts. He has had dry cough for 3 - 4 days which has not changed. He has been taking all of his medications as prescribed including anti-hypertensives. He has been eating OK and drinking fairly well but per sister, has not been drinking as much water as he should (though does drink lots of tea and soda).  Assessment & Plan:   Principal Problem:   Shock (HCC) Active Problems:   CAD (coronary artery disease)   ILD (interstitial lung disease) (HCC)   Chronic combined systolic and diastolic CHF (congestive heart failure) (HCC)   Leukocytosis   High anion gap metabolic  acidosis   Acute kidney injury (HCC)   Sacral ulcer, with unspecified severity (HCC)   Cerebral thrombosis with cerebral infarction  Hypovolemic shock  -Felt to be secondary to hypovolemia + component of adrenal insufficiency although the inciting cause for his hypovolemia is not immediately clear to me on chart review and discussion with him.  His opiates were increased on 5/31, but his sister administers this and notes he did not have any extra doses.  Infection seems to have been ruled out at this point with negative blood and urine cultures (sacral wounds don't appear infected).  Now on his home steroid dose after stress dose steroids.  He has diarrhea, but this seems to have started after his hospitalization.  - blood pressure better today  Diarrhea - negative GI path and C diff - improved - continue miralax as now without BM  History of CVA  Acute Stroke : with residual L sided weakness, however, 6/9 had what seemed like more weakness on his right lower extremity.  MRI showed infarct involving the medial L basal ganglia and posterior L centrum semi ovale. - neuro c/s, appreciate recs - ASA, plavix x3 weeks followed by plavix alone - atorvastatin 40 - A1c 5.2, LDL 42 - CTA head and neck with atherosclerotic narrowing of distal L common carotid artery through the L carotid bifurcation without a significant stenosis relative to the more distal vessel.  "although there is no significant stenosis, the extensive luminal plaque could be a source for distal embolic" (discussed with neurology, stroke likely 2/2 small  vessel disease).  CTA head/neck also notable for high grade stenosis of proximal non dominant L vertebral artery and mild distal small vessel disease within the circle of willis. - PT/OT/SLP - recommending CIR  ILD due to RA and mold exposure - Followed by Dr. [name] PCCM - On chronic steroids.  Home dose prednisone resumed. 10 mg daily (home dose) - Plaquenil 200 mg BID -  DuoNeb TID - Albuterol PRN   Chronic respiratory failure with hypoxia (home O2; 2 L O2 nocturnally) -Multifactorial to include ILD, COPD, continued tobacco abuse -Patient counseled on need to absolutely discontinue smoking. -Titrate O2 to maintain SPO2 89 to 93% - on O2 right now, will wean daytime O2 as tolerated (doing well on RA today)  COPD/Tobacco abuse - Nicotine patch   Chronic systolic and diastolic CHF - 6/4 Echocardiogram: LVEF= 30 to 35%, grade 2 diastolic dysfunction - Decrease coreg to 3.125 mg BID - holding spironolactone, entresto, lasix at this time -Strict in and out -Daily weight - Transfuse for hemoglobin <8  Sinus Pause  Bradycardia: stable with decreased carvedilol.  1.18 second pause noted on tele, asx.  Continue to monitor with reduced dose carvedilol.  Elevated troponin  Demand ischemia:  Echo with EF 30-35% (slightly decreased from EF 01/2017 with 35-40%).    -Felt secondary to stress (Type 2 NSTEMI with renal failure contributing) - would recommend outpatient cardiology follow up - of note, with previous cath in 01/2017, recommended considering AICD if no improvement in LV function with medical therapy - continue asa and atorvastatin  Hx MI -ASA, atorvastatin (and plavix as above)  Essential HTN -coreg (decreased dose with soft BP and bradycardia) - holding spironolactone, entresto, lasix  CAD -S/P CABG -See CHF  AAA: noted  Hx NSVT: noted, none noted.  As above, likely needs AICD at some point.  RIGHTStage II buttocks ulcer - Wound care consult - Doesn't appear infected at this time.   - nutrition consult  Acute kidney injury - Due to shock above, peaked ~8, now back to baseline  Relative adrenal insufficiency  --Home dose steroids restarted.  Altered mental status -Most likely secondary to hypoxia, ICU delirium, hypotension and shock. - Resolved -Minimize sedating medication.  Goals of care -PT/OT consult: Patient  with acute on chronic respiratory failure secondary to ILD, RA, tobacco abuse, COPD evaluate for CIR vs SNF  DVT prophylaxis: heparin Code Status: full code Family Communication: sister at bedside on 6/10 Disposition Plan: CIR   Consultants:   PCCM  Procedures:  CT head 6/3 > no acute process. CXR 6/3 > no acute process. Echo 6/4 >LVEF 30-35%, Grade 2 DD  Antimicrobials: Anti-infectives (From admission, onward)   Start     Dose/Rate Route Frequency Ordered Stop   04/07/18 1200  hydroxychloroquine (PLAQUENIL) tablet 200 mg     200 mg Oral 2 times daily 04/07/18 1119     04/06/18 1700  vancomycin (VANCOCIN) IVPB 1000 mg/200 mL premix  Status:  Discontinued     1,000 mg 200 mL/hr over 60 Minutes Intravenous Every 48 hours 04/04/18 1653 04/06/18 0708   04/06/18 0900  piperacillin-tazobactam (ZOSYN) IVPB 3.375 g  Status:  Discontinued     3.375 g 12.5 mL/hr over 240 Minutes Intravenous Every 8 hours 04/06/18 0708 04/06/18 0756   04/06/18 0800  vancomycin (VANCOCIN) IVPB 750 mg/150 ml premix  Status:  Discontinued     750 mg 150 mL/hr over 60 Minutes Intravenous Every 12 hours 04/06/18 0708 04/06/18 0756   04/05/18  0100  piperacillin-tazobactam (ZOSYN) IVPB 2.25 g  Status:  Discontinued     2.25 g 100 mL/hr over 30 Minutes Intravenous Every 8 hours 04/04/18 1653 04/06/18 0708   04/04/18 2145  vancomycin (VANCOCIN) IVPB 1000 mg/200 mL premix     1,000 mg 200 mL/hr over 60 Minutes Intravenous  Once 04/04/18 2137 04/04/18 2255   04/04/18 1615  piperacillin-tazobactam (ZOSYN) IVPB 3.375 g     3.375 g 100 mL/hr over 30 Minutes Intravenous  Once 04/04/18 1612 04/04/18 1856   04/04/18 1615  vancomycin (VANCOCIN) IVPB 1000 mg/200 mL premix     1,000 mg 200 mL/hr over 60 Minutes Intravenous  Once 04/04/18 1612 04/04/18 1857     Subjective: No complaints today.  Objective: Vitals:   04/11/18 0200 04/11/18 0402 04/11/18 0422 04/11/18 0828  BP: 110/61 (!) 106/92  138/84  Pulse: 80  88  100  Resp: 20 20  18   Temp: 98.1 F (36.7 C) 98.2 F (36.8 C)  98.1 F (36.7 C)  TempSrc: Oral Oral  Oral  SpO2: 94% 95%  97%  Weight:   100.7 kg (222 lb)   Height:        Intake/Output Summary (Last 24 hours) at 04/11/2018 1018 Last data filed at 04/11/2018 0900 Gross per 24 hour  Intake 1375 ml  Output 2100 ml  Net -725 ml   Filed Weights   04/09/18 0500 04/10/18 0524 04/11/18 0422  Weight: 98.9 kg (218 lb) 98 kg (216 lb) 100.7 kg (222 lb)    Examination:  General: No acute distress. Cardiovascular: Heart sounds show a regular rate, and rhythm. No gallops or rubs. No murmurs. No JVD. Lungs: Clear to auscultation bilaterally with good air movement. No rales, rhonchi or wheezes. Abdomen: Soft, nontender, nondistended with normal active bowel sounds. No masses. No hepatosplenomegaly. Neurological: Alert and oriented 3. L sided facial droop.  Chronic L sided weakness, but now R>L LE weakness. Skin: sacrum not examined today Extremities: No clubbing or cyanosis. No edema. Psychiatric: Mood and affect are normal. Insight and judgment are appropriate.  Data Reviewed: I have personally reviewed following labs and imaging studies  CBC: Recent Labs  Lab 04/04/18 1505  04/07/18 0317 04/08/18 0709 04/09/18 0631 04/10/18 0724 04/11/18 0444  WBC 14.6*   < > 10.8* 8.5 9.1 9.3 9.3  NEUTROABS 12.8*  --   --   --   --   --   --   HGB 10.9*   < > 10.9* 10.4* 10.1* 10.4* 11.3*  HCT 33.1*   < > 33.8* 33.3* 32.4* 33.3* 35.9*  MCV 89.7   < > 89.2 93.3 92.6 91.0 90.2  PLT 192   < > 212 184 190 199 223   < > = values in this interval not displayed.   Basic Metabolic Panel: Recent Labs  Lab 04/05/18 0442  04/06/18 0445 04/07/18 0317 04/08/18 0709 04/09/18 0631 04/10/18 0724 04/11/18 0444  NA 139   < > 142 145 145 142 142 138  K 4.8   < > 4.3 3.9 3.8 3.7 3.8 4.3  CL 107   < > 108 111 111 105 103 103  CO2 17*   < > 25 25 23 30 31 29   GLUCOSE 100*   < > 119* 119* 89 97  106* 82  BUN 62*   < > 35* 24* 19 14 12 10   CREATININE 4.90*   < > 1.62* 0.91 0.80 0.73 0.71 0.77  CALCIUM 7.4*   < >  8.2* 8.6* 8.5* 8.4* 8.3* 8.6*  MG 1.9  --  2.2 2.1 1.8 1.8 1.7 1.7  PHOS 5.0*  --  2.4*  --   --   --   --   --    < > = values in this interval not displayed.   GFR: Estimated Creatinine Clearance: 119.4 mL/min (by C-G formula based on SCr of 0.77 mg/dL). Liver Function Tests: Recent Labs  Lab 04/04/18 1505 04/07/18 0317 04/09/18 0631 04/10/18 0724  AST 92* 67* 33 24  ALT 34 45 34 31  ALKPHOS 76 64 58 56  BILITOT 1.1 0.6 0.8 0.7  PROT 6.3* 6.1* 5.5* 5.3*  ALBUMIN 2.7* 2.3* 2.1* 2.1*   No results for input(s): LIPASE, AMYLASE in the last 168 hours. No results for input(s): AMMONIA in the last 168 hours. Coagulation Profile: Recent Labs  Lab 04/04/18 1505  INR 1.05   Cardiac Enzymes: Recent Labs  Lab 04/05/18 0111 04/05/18 0442 04/07/18 0317  TROPONINI 0.28* 0.86* 0.48*   BNP (last 3 results) No results for input(s): PROBNP in the last 8760 hours. HbA1C: Recent Labs    04/11/18 0444  HGBA1C 5.2   CBG: Recent Labs  Lab 04/05/18 0028 04/05/18 1117 04/05/18 1548  GLUCAP 95 139* 121*   Lipid Profile: Recent Labs    04/11/18 0444  CHOL 90  HDL 21*  LDLCALC 42  TRIG 825  CHOLHDL 4.3   Thyroid Function Tests: No results for input(s): TSH, T4TOTAL, FREET4, T3FREE, THYROIDAB in the last 72 hours. Anemia Panel: No results for input(s): VITAMINB12, FOLATE, FERRITIN, TIBC, IRON, RETICCTPCT in the last 72 hours. Sepsis Labs: Recent Labs  Lab 04/04/18 1617 04/04/18 1756 04/05/18 0111 04/05/18 0442 04/06/18 0445  PROCALCITON  --   --  1.69 2.37 0.70  LATICACIDVEN 2.27* 1.02  --   --   --     Recent Results (from the past 240 hour(s))  Blood Culture (routine x 2)     Status: None   Collection Time: 04/04/18  4:25 PM  Result Value Ref Range Status   Specimen Description BLOOD RIGHT ANTECUBITAL  Final   Special Requests   Final     BOTTLES DRAWN AEROBIC AND ANAEROBIC Blood Culture adequate volume   Culture   Final    NO GROWTH 5 DAYS Performed at [facility], [address] [place], [place] [address]    Report Status 04/09/2018 FINAL  Final  Blood Culture (routine x 2)     Status: None   Collection Time: 04/04/18  5:20 PM  Result Value Ref Range Status   Specimen Description BLOOD LEFT HAND  Final   Special Requests   Final    BOTTLES DRAWN AEROBIC ONLY Blood Culture results may not be optimal due to an inadequate volume of blood received in culture bottles   Culture   Final    NO GROWTH 5 DAYS Performed at [facility], [address], [place], [place] [address]    Report Status 04/09/2018 FINAL  Final  Urine culture     Status: None   Collection Time: 04/04/18  7:45 PM  Result Value Ref Range Status   Specimen Description URINE, RANDOM  Final   Special Requests NONE  Final   Culture   Final    NO GROWTH Performed at [facility], [address], [place], [place] [address]    Report Status 04/06/2018 FINAL  Final  Culture, blood (routine x 2)  Status: None   Collection Time: 04/05/18 12:54 AM  Result Value Ref Range Status   Specimen Description BLOOD RIGHT HAND  Final   Special Requests   Final    BOTTLES DRAWN AEROBIC ONLY Blood Culture adequate volume   Culture   Final    NO GROWTH 5 DAYS Performed at [facility], [address], [place], [place] [address]    Report Status 04/10/2018 FINAL  Final  Culture, blood (routine x 2)     Status: None   Collection Time: 04/05/18  1:11 AM  Result Value Ref Range Status   Specimen Description BLOOD LEFT A-LINE  Final   Special Requests   Final    BOTTLES DRAWN AEROBIC ONLY Blood Culture adequate volume   Culture   Final    NO GROWTH 5 DAYS Performed at [facility], [address], [place], [place] [address]    Report Status 04/10/2018 FINAL  Final  MRSA PCR Screening     Status: None   Collection Time: 04/05/18   4:04 AM  Result Value Ref Range Status   MRSA by PCR NEGATIVE NEGATIVE Final    Comment:        The GeneXpert MRSA Assay (FDA approved for NASAL specimens only), is one component of a comprehensive MRSA colonization surveillance program. It is not intended to diagnose MRSA infection nor to guide or monitor treatment for MRSA infections. Performed at [facility], [address] [place], [place] [address]   C difficile quick scan w PCR reflex     Status: None   Collection Time: 04/07/18  1:16 PM  Result Value Ref Range Status   C Diff antigen NEGATIVE NEGATIVE Final   C Diff toxin NEGATIVE NEGATIVE Final   C Diff interpretation No C. difficile detected.  Final    Comment: Performed at [facility], [address] [place] [address], [place], [place] [address]  Gastrointestinal Panel by PCR , Stool     Status: None   Collection Time: 04/07/18  1:16 PM  Result Value Ref Range Status   Campylobacter species NOT DETECTED NOT DETECTED Final   Plesimonas shigelloides NOT DETECTED NOT DETECTED Final   Salmonella species NOT DETECTED NOT DETECTED Final   Yersinia enterocolitica NOT DETECTED NOT DETECTED Final   Vibrio species NOT DETECTED NOT DETECTED Final   Vibrio cholerae NOT DETECTED NOT DETECTED Final   Enteroaggregative E coli (EAEC) NOT DETECTED NOT DETECTED Final   Enteropathogenic E coli (EPEC) NOT DETECTED NOT DETECTED Final   Enterotoxigenic E coli (ETEC) NOT DETECTED NOT DETECTED Final   Shiga like toxin producing E coli (STEC) NOT DETECTED NOT DETECTED Final   Shigella/Enteroinvasive E coli (EIEC) NOT DETECTED NOT DETECTED Final   Cryptosporidium NOT DETECTED NOT DETECTED Final   Cyclospora cayetanensis NOT DETECTED NOT DETECTED Final   Entamoeba histolytica NOT DETECTED NOT DETECTED Final   Giardia lamblia NOT DETECTED NOT DETECTED Final   Adenovirus F40/41 NOT DETECTED NOT DETECTED Final   Astrovirus NOT DETECTED NOT DETECTED Final   Norovirus GI/GII NOT DETECTED NOT  DETECTED Final   Rotavirus A NOT DETECTED NOT DETECTED Final   Sapovirus (I, II, IV, and V) NOT DETECTED NOT DETECTED Final    Comment: Performed at [facility], [address] [place], [place] [address]         Radiology Studies: Ct Angio Head W Or Wo Contrast  Result Date: 04/10/2018 CLINICAL DATA:  Left basal ganglia and centrum  semi ovale infarcts. Stroke follow-up. Weakness. EXAM: CT ANGIOGRAPHY HEAD AND NECK TECHNIQUE: Multidetector CT imaging of the head and neck was performed using the standard protocol during bolus administration of intravenous contrast. Multiplanar CT image reconstructions and MIPs were obtained to evaluate the vascular anatomy. Carotid stenosis measurements (when applicable) are obtained utilizing NASCET criteria, using the distal internal carotid diameter as the denominator. CONTRAST:  50mL ISOVUE-370 IOPAMIDOL (ISOVUE-370) INJECTION 76% COMPARISON:  MRI brain 04/10/2018 FINDINGS: CT HEAD FINDINGS Brain: Bilateral basal ganglia infarcts are noted. Remote right corona radiata infarcts are present. The punctate left parietal white matter infarct is not appreciated by CT. Ventricles are of normal size. No significant extra-axial fluid collection is present. There is no acute hemorrhage. Vascular: Atherosclerotic calcifications are present within the cavernous internal carotid arteries. There is no hyperdense vessel. Skull: Calvarium is intact. No focal lytic or blastic lesions are present. ORIF of the left orbital rim is noted. Sinuses: The paranasal sinuses and mastoid air cells are clear. Orbits: Globes and orbits are within normal limits. Review of the MIP images confirms the above findings CTA NECK FINDINGS Aortic arch: Patient is status post median sternotomy for CABG. Atherosclerotic changes are noted at the origins the great vessels without significant stenosis relative to the more distal vessels. Right carotid system: The right common carotid artery  demonstrates some atherosclerotic change without focal stenosis. The right carotid bifurcation is patulous, suggesting previous endarterectomy more distal cervical right ICA is normal. Left carotid system: The left common carotid artery demonstrates distal atherosclerotic plaque. Additional calcified and noncalcified plaque is present at the left carotid bifurcation. Minimal luminal diameter is 3.5 mm. There is no significant stenosis relative to the more distal vessel. The more distal left common carotid artery is within normal limits. Vertebral arteries: The right vertebral artery is the dominant vessel. Dense calcification is present at the origin of the non dominant left vertebral artery. There is no significant stenosis at the origin of the dominant right vertebral artery. No other significant stenoses are present in either vertebral artery in the neck. Calcifications are present along the wall of the right vertebral artery at the C1 level. Skeleton: Rightward curvature is present in the cervical spine. Asymmetric left-sided facet spurring is noted. Vertebral body heights and alignment are maintained. No focal lytic or blastic lesions are present. Other neck: No focal mucosal or submucosal lesions are present. Thyroid is within normal limits. No significant cervical adenopathy is present. Salivary glands are within normal limits bilaterally. Upper chest: Peripheral interstitial lung changes are present bilaterally. No focal nodule, mass, or airspace disease is present. The thoracic inlet is within normal limits. Review of the MIP images confirms the above findings CTA HEAD FINDINGS Anterior circulation: Atherosclerotic changes are present within the cavernous internal carotid arteries bilaterally without a significant stenosis relative to the more distal vessel. The ICA termini are within normal limits bilaterally. The A1 and M1 segments are normal. The anterior communicating artery is patent. MCA bifurcations  are within normal limits. ACA and MCA branch vessels demonstrate mild distal attenuation without a significant proximal stenosis or occlusion. No significant aneurysm is present. Posterior circulation: The right vertebral artery is the dominant vessel. PICA origins are visualized and normal. Basilar artery is small distally. Right posterior cerebral artery is of fetal type. The left posterior cerebral artery fills from the basilar tip with contribution from a left posterior communicating artery. The PCA branch vessels are within normal limits. Venous sinuses: The dural sinuses are patent. The  straight sinus and deep cerebral veins are intact. Cortical veins are unremarkable. Anatomic variants: Fetal type right posterior cerebral artery. Prominent left posterior communicating artery. Delayed phase: The postcontrast images accentuate white matter disease and previous white matter infarcts. No pathologic enhancement is present. Review of the MIP images confirms the above findings IMPRESSION: 1. Long segment atherosclerotic narrowing of the distal left common carotid artery through the left carotid bifurcation without a significant stenosis relative to the more distal vessel. Although there is no significant stenosis. The extensive luminal plaque could be a source for distal emboli. 2. Previous right carotid endarterectomy is patent. 3. Atherosclerotic changes at the aortic arch and bilateral cavernous internal carotid arteries without significant stenosis. 4. 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 5. Mild distal small vessel disease within the circle-of-Willis without significant proximal stenosis, aneurysm, or branch vessel occlusion. 6. Degenerative changes of the cervical spine with rightward curvature and asymmetric left-sided facet arthropathy. 7. Changes of interstitial lung disease noted. Electronically Signed   By: Marin Roberts M.D.   On: 04/10/2018 19:59   Ct Angio  Neck W Or Wo Contrast  Result Date: 04/10/2018 CLINICAL DATA:  Left basal ganglia and centrum semi ovale infarcts. Stroke follow-up. Weakness. EXAM: CT ANGIOGRAPHY HEAD AND NECK TECHNIQUE: Multidetector CT imaging of the head and neck was performed using the standard protocol during bolus administration of intravenous contrast. Multiplanar CT image reconstructions and MIPs were obtained to evaluate the vascular anatomy. Carotid stenosis measurements (when applicable) are obtained utilizing NASCET criteria, using the distal internal carotid diameter as the denominator. CONTRAST:  50mL ISOVUE-370 IOPAMIDOL (ISOVUE-370) INJECTION 76% COMPARISON:  MRI brain 04/10/2018 FINDINGS: CT HEAD FINDINGS Brain: Bilateral basal ganglia infarcts are noted. Remote right corona radiata infarcts are present. The punctate left parietal white matter infarct is not appreciated by CT. Ventricles are of normal size. No significant extra-axial fluid collection is present. There is no acute hemorrhage. Vascular: Atherosclerotic calcifications are present within the cavernous internal carotid arteries. There is no hyperdense vessel. Skull: Calvarium is intact. No focal lytic or blastic lesions are present. ORIF of the left orbital rim is noted. Sinuses: The paranasal sinuses and mastoid air cells are clear. Orbits: Globes and orbits are within normal limits. Review of the MIP images confirms the above findings CTA NECK FINDINGS Aortic arch: Patient is status post median sternotomy for CABG. Atherosclerotic changes are noted at the origins the great vessels without significant stenosis relative to the more distal vessels. Right carotid system: The right common carotid artery demonstrates some atherosclerotic change without focal stenosis. The right carotid bifurcation is patulous, suggesting previous endarterectomy more distal cervical right ICA is normal. Left carotid system: The left common carotid artery demonstrates distal atherosclerotic  plaque. Additional calcified and noncalcified plaque is present at the left carotid bifurcation. Minimal luminal diameter is 3.5 mm. There is no significant stenosis relative to the more distal vessel. The more distal left common carotid artery is within normal limits. Vertebral arteries: The right vertebral artery is the dominant vessel. Dense calcification is present at the origin of the non dominant left vertebral artery. There is no significant stenosis at the origin of the dominant right vertebral artery. No other significant stenoses are present in either vertebral artery in the neck. Calcifications are present along the wall of the right vertebral artery at the C1 level. Skeleton: Rightward curvature is present in the cervical spine. Asymmetric left-sided facet spurring is noted. Vertebral body heights and alignment are maintained.  No focal lytic or blastic lesions are present. Other neck: No focal mucosal or submucosal lesions are present. Thyroid is within normal limits. No significant cervical adenopathy is present. Salivary glands are within normal limits bilaterally. Upper chest: Peripheral interstitial lung changes are present bilaterally. No focal nodule, mass, or airspace disease is present. The thoracic inlet is within normal limits. Review of the MIP images confirms the above findings CTA HEAD FINDINGS Anterior circulation: Atherosclerotic changes are present within the cavernous internal carotid arteries bilaterally without a significant stenosis relative to the more distal vessel. The ICA termini are within normal limits bilaterally. The A1 and M1 segments are normal. The anterior communicating artery is patent. MCA bifurcations are within normal limits. ACA and MCA branch vessels demonstrate mild distal attenuation without a significant proximal stenosis or occlusion. No significant aneurysm is present. Posterior circulation: The right vertebral artery is the dominant vessel. PICA origins are  visualized and normal. Basilar artery is small distally. Right posterior cerebral artery is of fetal type. The left posterior cerebral artery fills from the basilar tip with contribution from a left posterior communicating artery. The PCA branch vessels are within normal limits. Venous sinuses: The dural sinuses are patent. The straight sinus and deep cerebral veins are intact. Cortical veins are unremarkable. Anatomic variants: Fetal type right posterior cerebral artery. Prominent left posterior communicating artery. Delayed phase: The postcontrast images accentuate white matter disease and previous white matter infarcts. No pathologic enhancement is present. Review of the MIP images confirms the above findings IMPRESSION: 1. Long segment atherosclerotic narrowing of the distal left common carotid artery through the left carotid bifurcation without a significant stenosis relative to the more distal vessel. Although there is no significant stenosis. The extensive luminal plaque could be a source for distal emboli. 2. Previous right carotid endarterectomy is patent. 3. Atherosclerotic changes at the aortic arch and bilateral cavernous internal carotid arteries without significant stenosis. 4. 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 5. Mild distal small vessel disease within the circle-of-Willis without significant proximal stenosis, aneurysm, or branch vessel occlusion. 6. Degenerative changes of the cervical spine with rightward curvature and asymmetric left-sided facet arthropathy. 7. Changes of interstitial lung disease noted. Electronically Signed   By: Marin Roberts M.D.   On: 04/10/2018 19:59   Mr Brain Wo Contrast  Result Date: 04/10/2018 CLINICAL DATA:  Neuro deficits, subacute.  Weakness. EXAM: MRI HEAD WITHOUT CONTRAST TECHNIQUE: Multiplanar, multiecho pulse sequences of the brain and surrounding structures were obtained without intravenous contrast. COMPARISON:  CT  head without contrast 04/04/2018. FINDINGS: Brain: 2 punctate foci of restricted diffusion are compatible with acute nonhemorrhagic infarcts. These involve the medial left lentiform nucleus and posterior left centrum semi ovale. No acute hemorrhage or mass lesion is present. Remote infarcts of the right right radiata are noted. The ventricles are proportionate to the degree of atrophy. No significant extra-axial fluid collection is present. Vascular: Flow is present in the major intracranial arteries. Skull and upper cervical spine: The skull base is within normal limits. The craniocervical junction is normal. Sinuses/Orbits: The paranasal sinuses are clear. There is some fluid in the inferior mastoid air cells bilaterally. No obstructing nasopharyngeal lesion is present. Globes and orbits are within normal limits. IMPRESSION: 1. Punctate nonhemorrhagic infarct involving the medial left basal ganglia. 2. Punctate nonhemorrhagic white matter infarct in the posterior left centrum semi ovale. 3. Remote white matter infarcts in the right corona radiata. This could be a watershed insult. 4. Moderate atrophy  and white matter disease otherwise compatible with chronic microvascular ischemia. Electronically Signed   By: Marin Roberts M.D.   On: 04/10/2018 15:50        Scheduled Meds: .  stroke: mapping our early stages of recovery book   Does not apply Once  . aspirin  81 mg Oral Daily  . atorvastatin  80 mg Oral q1800  . carvedilol  3.125 mg Oral BID WC  . clopidogrel  75 mg Oral Daily  . feeding supplement (ENSURE ENLIVE)  237 mL Oral TID BM  . heparin  5,000 Units Subcutaneous Q8H  . hydroxychloroquine  200 mg Oral BID  . ipratropium-albuterol  3 mL Nebulization TID  . mouth rinse  15 mL Mouth Rinse BID  . multivitamin with minerals  1 tablet Oral Daily  . nicotine  7 mg Transdermal Daily  . pantoprazole  40 mg Oral Q1200  . polyethylene glycol  17 g Oral Daily  . predniSONE  10 mg Oral Q  breakfast   Continuous Infusions: . sodium chloride Stopped (04/05/18 0154)  . lactated ringers 50 mL/hr at 04/10/18 0657     LOS: 7 days    Time spent: over 30 min    Lacretia Nicks, MD [facility] Pager [phone]  If 7PM-7AM, please contact night-coverage [email or web address] Password [email or web address] 04/11/2018, 10:18 AM

## 2018-04-11 NOTE — NC FL2 (Signed)
[place] MEDICAID FL2 LEVEL OF CARE SCREENING TOOL     IDENTIFICATION  Patient Name: [name] Birthdate: 1955-11-08 Sex: male Admission Date (Current Location): 04/04/2018  [employer] and [place] Number:  Producer, television/film/video and Address:  [facility], [address], [place], [place] [address]      Provider Number: [id number]  Attending Physician Name and Address:  Zigmund Daniel., *  Relative Name and Phone Number:       Current Level of Care: Hospital Recommended Level of Care: Skilled Nursing Facility Prior Approval Number:    Date Approved/Denied:   PASRR Number: [id number]  Discharge Plan: SNF    Current Diagnoses: Patient Active Problem List   Diagnosis Date Noted  . Cerebral thrombosis with cerebral infarction 04/11/2018  . Sacral ulcer, with unspecified severity (HCC) 04/05/2018  . Pressure injury of skin of sacral region   . Severe comorbid illness   . Acute renal failure (HCC)   . Shock (HCC) 04/04/2018  . High anion gap metabolic acidosis 04/04/2018  . Acute kidney injury (HCC) 04/04/2018  . Cervicalgia 01/18/2018  . Chronic pain of both shoulders 01/18/2018  . Hx of rheumatoid arthritis 01/18/2018  . Chronic pain syndrome 01/18/2018  . Leukocytosis 12/15/2017  . Cough   . Pneumonia 11/01/2017  . Elevated lipase   . Elevated liver enzymes   . Acute pancreatitis 08/08/2017  . Carotid artery disease (HCC) 05/12/2017  . 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 Chronic combined systolic and diastolic CHF (congestive heart failure) (HCC) 03/07/2017  . S/P CABG (coronary artery bypass graft)   . Pure hypercholesterolemia   . Acute combined systolic and diastolic heart failure (HCC)   . Exertional shortness of breath   . Solitary pulmonary nodule   . CAD (coronary artery disease) 02/24/2017  . ILD (interstitial lung disease) (HCC) 02/24/2017  . Normocytic anemia 02/24/2017  . AAA  (abdominal aortic aneurysm) (HCC) 02/24/2017  . Abnormal EKG 02/24/2017  . Acute respiratory failure with hypoxia (HCC) 02/24/2017  . History of stroke 02/24/2017  . Essential hypertension 02/24/2017  . Current smoker 02/24/2017    Orientation RESPIRATION BLADDER Height & Weight     Self, Time, Situation, Place  Normal Incontinent Weight: 222 lb (100.7 kg) Height:  6\' 1"  (185.4 cm)  BEHAVIORAL SYMPTOMS/MOOD NEUROLOGICAL BOWEL NUTRITION STATUS      Incontinent Diet(heart healthy)  AMBULATORY STATUS COMMUNICATION OF NEEDS Skin   Extensive Assist Verbally PU Stage and Appropriate Care   PU Stage 2 Dressing: Daily(buttocks, foam dressing; sacrum, foam dressing; scrotum, foam dressing)                   Personal Care Assistance Level of Assistance  Bathing, Feeding, Dressing Bathing Assistance: Limited assistance Feeding assistance: Limited assistance Dressing Assistance: Limited assistance     Functional Limitations Info  Sight, Hearing, Speech Sight Info: Adequate Hearing Info: Adequate Speech Info: Adequate    SPECIAL CARE FACTORS FREQUENCY  PT (By licensed PT), OT (By licensed OT)     PT Frequency: 5x/wk OT Frequency: 5x/wk            Contractures Contractures Info: Not present    Additional Factors Info  Code Status, Allergies Code Status Info: Full Allergies Info: NKA           Current Medications (04/11/2018):  This is the current hospital active medication list Current Facility-Administered Medications  Medication Dose Route Frequency Provider Last Rate Last  Dose  .  stroke: mapping our early stages of recovery book   Does not apply Once Zigmund Daniel., MD      . 0.9 %  sodium chloride infusion  250 mL Intravenous PRN Zigmund Daniel., MD   Stopped at 04/05/18 0154  . albuterol (PROVENTIL) (2.5 MG/3ML) 0.083% nebulizer solution 2.5 mg  2.5 mg Nebulization Q3H PRN Zigmund Daniel., MD   2.5 mg at 04/04/18 2355  . aspirin chewable  tablet 81 mg  81 mg Oral Daily Zigmund Daniel., MD   81 mg at 04/11/18 2330  . atorvastatin (LIPITOR) tablet 80 mg  80 mg Oral q1800 Zigmund Daniel., MD      . carvedilol (COREG) tablet 3.125 mg  3.125 mg Oral BID WC Zigmund Daniel., MD   3.125 mg at 04/11/18 0930  . clopidogrel (PLAVIX) tablet 75 mg  75 mg Oral Daily Zigmund Daniel., MD   75 mg at 04/11/18 0930  . collagenase (SANTYL) ointment   Topical Daily Zigmund Daniel., MD      . feeding supplement (ENSURE ENLIVE) (ENSURE ENLIVE) liquid 237 mL  237 mL Oral TID BM Zigmund Daniel., MD   237 mL at 04/11/18 0931  . heparin injection 5,000 Units  5,000 Units Subcutaneous Q8H Zigmund Daniel., MD   5,000 Units at 04/11/18 0762  . hydroxychloroquine (PLAQUENIL) tablet 200 mg  200 mg Oral BID Zigmund Daniel., MD   200 mg at 04/11/18 0929  . ipratropium-albuterol (DUONEB) 0.5-2.5 (3) MG/3ML nebulizer solution 3 mL  3 mL Nebulization TID Zigmund Daniel., MD   3 mL at 04/11/18 2633  . lactated ringers infusion   Intravenous Continuous Zigmund Daniel., MD 50 mL/hr at 04/10/18 [phone]    . MEDLINE mouth rinse  15 mL Mouth Rinse BID Zigmund Daniel., MD   15 mL at 04/11/18 0931  . multivitamin with minerals tablet 1 tablet  1 tablet Oral Daily Zigmund Daniel., MD   1 tablet at 04/11/18 6256  . nicotine (NICODERM CQ - dosed in mg/24 hr) patch 7 mg  7 mg Transdermal Daily Zigmund Daniel., MD   7 mg at 04/11/18 0930  . ondansetron (ZOFRAN) injection 4 mg  4 mg Intravenous Q8H PRN Zigmund Daniel., MD      . oxyCODONE (Oxy IR/ROXICODONE) immediate release tablet 5-10 mg  5-10 mg Oral Q6H PRN Zigmund Daniel., MD   10 mg at 04/11/18 1124  . pantoprazole (PROTONIX) EC tablet 40 mg  40 mg Oral Q1200 Zigmund Daniel., MD   40 mg at 04/11/18 0929  . polyethylene glycol (MIRALAX / GLYCOLAX) packet 17 g  17 g Oral Daily Zigmund Daniel., MD   17 g at 04/11/18  0930  . predniSONE (DELTASONE) tablet 10 mg  10 mg Oral Q breakfast Zigmund Daniel., MD   10 mg at 04/11/18 0929  . promethazine (PHENERGAN) injection 25 mg  25 mg Intravenous Q6H PRN Zigmund Daniel., MD   25 mg at 04/07/18 3893     Discharge Medications: Please see discharge summary for a list of discharge medications.  Relevant Imaging Results:  Relevant Lab Results:   Additional Information SS#: [id number]  Baldemar Lenis, LCSW

## 2018-04-11 NOTE — Progress Notes (Addendum)
Physical Therapy Treatment Patient Details Name: [name] MRN: [id number] DOB: 1956-04-21 Today's Date: 04/11/2018    History of Present Illness 62 y.o.WM PMHx CVA, AAA ,CAD, Chronic combined systolic and diastolic CHF(April 2018 EF 35 to 40%),MI,  ILD, Lung nodule,  (followed by Dr. [name] and on chronic prednisone),Tobacco abuse, MI, NSVT, Normocytic anemia,Pancreatitis, RA (rheumatoid arthritis),Patient with acute on chronic respiratory failure secondary to ILD.     PT Comments    Patient is making progress toward mobility goals and eager to participate in therapy. Sister present during session and supportive. Pt required +2 physical assistance for STS X3 and +2 for safety/equipment once in standing and to pivot bed recliner. Recommend CIR for further skilled PT services to maximize independence and safety with mobility.   Follow Up Recommendations  CIR     Equipment Recommendations  None recommended by PT    Recommendations for Other Services       Precautions / Restrictions Precautions Precautions: Fall Restrictions Weight Bearing Restrictions: No    Mobility  Bed Mobility Overal bed mobility: Needs Assistance Bed Mobility: Supine to Sit     Supine to sit: Min assist     General bed mobility comments: assist to scoot hips toward EOB and to elevate trunk into sitting; use of rail and HOB   Transfers Overall transfer level: Needs assistance Equipment used: Rolling walker (2 wheeled) Transfers: Sit to/from [employer] Sit to Stand: +2 safety/equipment;Min assist;+2 physical assistance;Max assist Stand pivot transfers: Mod assist;+2 safety/equipment       General transfer comment: assistance required to power up into standing X2 from EOB and X1 from recliner and less assist needed once in standing; pt able to perform pre gait activity with min A and demonstrated difficulty with single leg stance on L LE; mod A +2 for pivot EOB to recliner  with assistance required for balance, weight shifting, and turning RW; poor eccentric loading   Ambulation/Gait                 Stairs             Wheelchair Mobility    Modified Rankin (Stroke Patients Only)       Balance Overall balance assessment: Needs assistance Sitting-balance support: Feet supported;Bilateral upper extremity supported Sitting balance-Leahy Scale: Fair     Standing balance support: Bilateral upper extremity supported Standing balance-Leahy Scale: Poor                              Cognition Arousal/Alertness: Awake/alert Behavior During Therapy: Flat affect Overall Cognitive Status: Within Functional Limits for tasks assessed                                        Exercises      General Comments General comments (skin integrity, edema, etc.): HR in 110s and SpO2 90-93% on RA       Pertinent Vitals/Pain Pain Assessment: 0-10 Faces Pain Scale: Hurts little more Pain Location: buttocks-sacral wounds Pain Descriptors / Indicators: Sore Pain Intervention(s): RN gave pain meds during session    Home Living     Available Help at Discharge: Family;Available 24 hours/day Type of Home: Mobile home              Prior Function  PT Goals (current goals can now be found in the care plan section) Acute Rehab PT Goals PT Goal Formulation: With patient/family Time For Goal Achievement: 04/21/18 Potential to Achieve Goals: Fair Progress towards PT goals: Progressing toward goals    Frequency    Min 3X/week      PT Plan Frequency needs to be updated;Discharge plan needs to be updated    Co-evaluation PT/OT/SLP Co-Evaluation/Treatment: Yes Reason for Co-Treatment: For patient/therapist safety;To address functional/ADL transfers PT goals addressed during session: Mobility/safety with mobility        AM-PAC PT "6 Clicks" Daily Activity  Outcome Measure  Difficulty turning over in  bed (including adjusting bedclothes, sheets and blankets)?: A Lot Difficulty moving from lying on back to sitting on the side of the bed? : Unable Difficulty sitting down on and standing up from a chair with arms (e.g., wheelchair, bedside commode, etc,.)?: Unable Help needed moving to and from a bed to chair (including a wheelchair)?: A Lot Help needed walking in hospital room?: A Lot Help needed climbing 3-5 steps with a railing? : Total 6 Click Score: 9    End of Session Equipment Utilized During Treatment: Gait belt Activity Tolerance: Patient tolerated treatment well Patient left: in chair;with call bell/phone within reach;with family/visitor present Nurse Communication: Mobility status PT Visit Diagnosis: Unsteadiness on feet (R26.81);Other abnormalities of gait and mobility (R26.89);Muscle weakness (generalized) (M62.81);Difficulty in walking, not elsewhere classified (R26.2);Other symptoms and signs involving the nervous system (R29.898)     Time: 5400-8676 PT Time Calculation (min) (ACUTE ONLY): 38 min  Charges:  $Therapeutic Activity: 23-37 mins                    G Codes:       Erline Levine, PTA Pager: [phone]     Carolynne Edouard 04/11/2018, 1:22 PM

## 2018-04-11 NOTE — Consult Note (Addendum)
WOC re-consult: Requested to assess sacrum and buttocks.  WOC consult was already performed on 6/6; refer to previous progress notes for assessment and measurements, and topical treatment orders were provided for staff nurse's use.  Inner gluteal fold/sacrum area is unstageable; Santyl ordered to provide enzymatic debridement of nonviable tissue.  Bilat buttock wounds have evolved into moist red stage 2 wounds and patchy areas of full thickness skin loss related to moisture associated skin damage.  Mod amt yellow drainage, painful to touch. Foam dressings in place to absorb drainage snd protect from further injury.  Discussed plan of care with patient and family member at the bedside. Please re-consult if further assistance is needed.  Thank-you,  Cammie Mcgee MSN, RN, CWOCN, [place], CNS [phone]

## 2018-04-11 NOTE — Progress Notes (Signed)
Rehab Admissions Coordinator Note:  Patient was screened by Clois Dupes for appropriateness for an Inpatient Acute Rehab Consult per PT and OT change in recommendations.   At this time, we are recommending Inpatient Rehab consult if pt would like to be considered for admit and sister can assist him once home. Please advise.  Clois Dupes 04/11/2018, 4:31 PM  I can be reached at [phone].

## 2018-04-11 NOTE — Progress Notes (Signed)
Occupational Therapy Treatment Patient Details Name: [name] MRN: [id number] DOB: 1956-08-25 Today's Date: 04/11/2018    History of present illness 62 y.o.WM PMHx CVA, AAA ,CAD, Chronic combined systolic and diastolic CHF(April 2018 EF 35 to 40%),MI,  ILD, Lung nodule,  (followed by Dr. [name] and on chronic prednisone),Tobacco abuse, MI, NSVT, Normocytic anemia,Pancreatitis, RA (rheumatoid arthritis),Patient with acute on chronic respiratory failure secondary to ILD.    OT comments  Pt demonstrating GREATER activity tolerance, balance, and highly motivated to participate with therapy. Able to sit EOB for grooming tasks for approx 10 min, improved standing balance with RW after max +2 assist for power up to standing. Pt dc recommendation upgraded to CIR as his motivation and knowledge will allow him to thrive in CIR setting and maximize safety and independence in ADL and functional transfers.   Follow Up Recommendations  CIR;Supervision/Assistance - 24 hour    Equipment Recommendations  None recommended by OT    Recommendations for Other Services Rehab consult    Precautions / Restrictions Precautions Precautions: Fall Restrictions Weight Bearing Restrictions: No       Mobility Bed Mobility Overal bed mobility: Needs Assistance Bed Mobility: Supine to Sit     Supine to sit: Min assist     General bed mobility comments: assist to scoot hips toward EOB and to elevate trunk into sitting; use of rail and HOB   Transfers Overall transfer level: Needs assistance Equipment used: Rolling walker (2 wheeled) Transfers: Sit to/from [employer] Sit to Stand: +2 safety/equipment;Min assist;+2 physical assistance;Max assist Stand pivot transfers: Mod assist;+2 safety/equipment       General transfer comment: assistance required to power up into standing X2 from EOB and X1 from recliner and less assist needed once in standing; pt able to perform pre gait  activity with min A and demonstrated difficulty with single leg stance on L LE; mod A +2 for pivot EOB to recliner with assistance required for balance, weight shifting, and turning RW; poor eccentric loading     Balance Overall balance assessment: Needs assistance Sitting-balance support: Feet supported;Bilateral upper extremity supported Sitting balance-Leahy Scale: Fair     Standing balance support: Bilateral upper extremity supported Standing balance-Leahy Scale: Poor                             ADL either performed or assessed with clinical judgement   ADL Overall ADL's : Needs assistance/impaired     Grooming: Wash/dry face;Set up;Sitting Grooming Details (indicate cue type and reason): EOB - improved seated balance                 Toilet Transfer: Maximal assistance;+2 for safety/equipment;Stand-pivot;BSC Toilet Transfer Details (indicate cue type and reason): max A +2 to stand initially, min A +2 once in standing, mod A +2 for pivot - assist to manage the walker and vc for sequencing Toileting- Clothing Manipulation and Hygiene: Total assistance;Sit to/from stand         General ADL Comments: improved activity tolerance this session     Vision       Perception     Praxis      Cognition Arousal/Alertness: Awake/alert Behavior During Therapy: Flat affect Overall Cognitive Status: Within Functional Limits for tasks assessed  Exercises     Shoulder Instructions       General Comments HR in 110s and SpO2 90-93% on RA     Pertinent Vitals/ Pain       Pain Assessment: Faces Faces Pain Scale: Hurts little more Pain Location: buttocks-sacral wounds Pain Descriptors / Indicators: Sore Pain Intervention(s): Monitored during session;Repositioned;Other (comment)(extra padding in seat of chair)  Home Living     Available Help at Discharge: Family;Available 24 hours/day Type of Home:  Mobile home                                  Prior Functioning/Environment              Frequency  Min 3X/week        Progress Toward Goals  OT Goals(current goals can now be found in the care plan section)  Progress towards OT goals: Progressing toward goals  Acute Rehab OT Goals Patient Stated Goal: "I want to be able to DO on my own again" OT Goal Formulation: With patient Time For Goal Achievement: 04/22/18 Potential to Achieve Goals: Good  Plan Discharge plan needs to be updated;Frequency needs to be updated    Co-evaluation    PT/OT/SLP Co-Evaluation/Treatment: Yes Reason for Co-Treatment: Complexity of the patient's impairments (multi-system involvement);For patient/therapist safety;To address functional/ADL transfers PT goals addressed during session: Mobility/safety with mobility;Balance;Proper use of DME OT goals addressed during session: ADL's and self-care;Proper use of Adaptive equipment and DME;Strengthening/ROM      AM-PAC PT "6 Clicks" Daily Activity     Outcome Measure   Help from another person eating meals?: A Little Help from another person taking care of personal grooming?: A Little Help from another person toileting, which includes using toliet, bedpan, or urinal?: A Lot Help from another person bathing (including washing, rinsing, drying)?: A Lot Help from another person to put on and taking off regular upper body clothing?: A Lot Help from another person to put on and taking off regular lower body clothing?: Total 6 Click Score: 13    End of Session Equipment Utilized During Treatment: Gait belt;Rolling walker  OT Visit Diagnosis: Unsteadiness on feet (R26.81)   Activity Tolerance Patient tolerated treatment well   Patient Left in chair;with call bell/phone within reach;with chair alarm set;with family/visitor present   Nurse Communication Mobility status        Time: 0272-5366 OT Time Calculation (min): 38  min  Charges: OT General Charges $OT Visit: 1 Visit OT Treatments $Self Care/Home Management : 8-22 mins  Sherryl Manges OTR/L [phone]   Evern Bio Durand Wittmeyer 04/11/2018, 3:57 PM

## 2018-04-11 NOTE — Evaluation (Signed)
Speech Language Pathology Evaluation Patient Details Name: [name] MRN: [id number] DOB: [birth year] Today's Date: 04/11/2018 Time: 8502-7741 SLP Time Calculation (min) (ACUTE ONLY): 30 min  Problem List:  Patient Active Problem List   Diagnosis Date Noted  . Cerebral thrombosis with cerebral infarction 04/11/2018  . Sacral ulcer, with unspecified severity (HCC) 04/05/2018  . Pressure injury of skin of sacral region   . Severe comorbid illness   . Acute renal failure (HCC)   . Shock (HCC) 04/04/2018  . High anion gap metabolic acidosis 04/04/2018  . Acute kidney injury (HCC) 04/04/2018  . Cervicalgia 01/18/2018  . Chronic pain of both shoulders 01/18/2018  . Hx of rheumatoid arthritis 01/18/2018  . Chronic pain syndrome 01/18/2018  . Leukocytosis 12/15/2017  . Cough   . Pneumonia 11/01/2017  . Elevated lipase   . Elevated liver enzymes   . Acute pancreatitis 08/08/2017  . Carotid artery disease (HCC) 05/12/2017  . 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 Chronic combined systolic and diastolic CHF (congestive heart failure) (HCC) 03/07/2017  . S/P CABG (coronary artery bypass graft)   . Pure hypercholesterolemia   . Acute combined systolic and diastolic heart failure (HCC)   . Exertional shortness of breath   . Solitary pulmonary nodule   . CAD (coronary artery disease) 02/24/2017  . ILD (interstitial lung disease) (HCC) 02/24/2017  . Normocytic anemia 02/24/2017  . AAA (abdominal aortic aneurysm) (HCC) 02/24/2017  . Abnormal EKG 02/24/2017  . Acute respiratory failure with hypoxia (HCC) 02/24/2017  . History of stroke 02/24/2017  . Essential hypertension 02/24/2017  . Current smoker 02/24/2017   Past Medical History:  Past Medical History:  Diagnosis Date  . AAA (abdominal aortic aneurysm) (HCC)   . Arthritis   . CAD in native artery    a. h/o MI x 3 s/p CABG x 4 (1993 with Tyrone Sage). b. LHC 01/2017 with severe native disease, 2  grafts occluded.  . Carotid artery disease (HCC)    a. s/p R CEA 11/2016.  [name] Chronic combined systolic and diastolic CHF (congestive heart failure) (HCC)   . GERD (gastroesophageal reflux disease)   . ILD (interstitial lung disease) (HCC)   . Lung nodule   . MI (myocardial infarction) (HCC)    x 3   . Normocytic anemia   . NSVT (nonsustained ventricular tachycardia) (HCC)   . Pancreatitis    1/19  . Pneumonia 11/2017  . RA (rheumatoid arthritis) (HCC)   . Stroke (HCC)   . Tobacco abuse    Past Surgical History:  Past Surgical History:  Procedure Laterality Date  . CARDIAC CATHETERIZATION    . CAROTID ENDARTERECTOMY Left   . Heart Bypass  10/1992  . LEFT HEART CATH AND CORS/GRAFTS ANGIOGRAPHY N/A 03/01/2017   Procedure: Left Heart Cath and Cors/Grafts Angiography;  Surgeon: Lyn Records, MD;  Location: [facility] INVASIVE CV LAB;  Service: Cardiovascular;  Laterality: N/A;  . Throat Biopsy     Cat scratch fever   HPI:  [name] PMHx CVA, AAA ,CAD, Chronic combined systolic and diastolic CHF(April 2018 EF 35 to 40%),MI,  ILD, Lung nodule,  (followed by Dr. [name] and on chronic prednisone),Tobacco abuse, MI, NSVT, Normocytic anemia,Pancreatitis, RA (rheumatoid arthritis),Patient with acute on chronic respiratory failure secondary to ILD.    Assessment / Plan / Recommendation Clinical Impression  Patient was cooperative during speech/language/cognitive assessment. Family member present reported patient's speech was intially garbled and he was confused, however she feels that has  cleared during hospitalization. Patient's orientation, attention, memory, reasoning and judgment were all within normal limits. He only answered 1/4 math calculations. Patient reports he has never been able to do math in his head. He was however able to do it when written. Patient's cognive and language skills appear to be at baseline and no further therapy is recommended at this time.     SLP Assessment  SLP  Recommendation/Assessment: Patient does not need any further Speech Lanaguage Pathology Services                     SLP Evaluation Cognition  Overall Cognitive Status: Within Functional Limits for tasks assessed Arousal/Alertness: Awake/alert Orientation Level: Oriented X4 Attention: Focused Focused Attention: Appears intact Memory: Appears intact Awareness: Appears intact Problem Solving: Appears intact Executive Function: Organizing Organizing: Appears intact Safety/Judgment: Appears intact       Comprehension  Auditory Comprehension Overall Auditory Comprehension: Appears within functional limits for tasks assessed Yes/No Questions: Within Functional Limits Commands: Within Functional Limits Conversation: Simple Visual Recognition/Discrimination Discrimination: Within Function Limits Reading Comprehension Reading Status: Within funtional limits    Expression Expression Primary Mode of Expression: Verbal Verbal Expression Overall Verbal Expression: Appears within functional limits for tasks assessed Initiation: No impairment Level of Generative/Spontaneous Verbalization: Conversation Repetition: No impairment Naming: No impairment Pragmatics: No impairment Written Expression Dominant Hand: Right Written Expression: Within Functional Limits   Oral / Motor  Oral Motor/Sensory Function Overall Oral Motor/Sensory Function: Within functional limits Motor Speech Overall Motor Speech: Appears within functional limits for tasks assessed Respiration: Within functional limits Phonation: Normal Resonance: Within functional limits Articulation: Within functional limitis Intelligibility: Intelligible Motor Planning: Witnin functional limits Motor Speech Errors: Not applicable   GO                   [name] Ryan Schloemer, MA, CCC-SLP 04/11/2018 12:15 PM

## 2018-04-11 NOTE — Progress Notes (Signed)
Stroke Team Progress Note   HPI:( per Dr Otelia Limes) [name] is an 62 y.o. male with a complex PMHx including prior stroke, CHF, prior MI, tobacco abuse, anemia and RA who presented to [facility] on 6/3 for evaluation of generalized profound weakness, which he woke up with that morning. He was hypotensive in the ED with electrolyte abnormalities. Today, Dr. Elijah Birk noted new RLE weakness. An MRI was ordered, revealing two punctate acute to subacute ischemic infarctions, one in the left basal ganglia and one in the left centrum semiovale.   The patient takes ASA 81 mg qd and atorvastatin as an outpatient.   LSN: Unknown tPA Given: No: Unknown time of onset   SUBJECTIVE  I have personally reviewed history of presenting illness with the patient.she presented with hypotension a few days ago with generalized weakness but subsequently was noted to have right leg weakness and MRI shows tiny subacute left basal ganglia lacunar infarct   OBJECTIVE Most recent Vital Signs: Temp: 98.2 F (36.8 C) (06/10 1540) Temp Source: Oral (06/10 1540) BP: 126/86 (06/10 1540) Pulse Rate: 81 (06/10 1540) Respiratory Rate: 18 O2 Saturdation: 97%  CBG (last 3)  No results for input(s): GLUCAP in the last 72 hours.     Studies:    MRI Brain : 1.Punctate nonhemorrhagic infarct involving the medial left basal ganglia. 2. Punctate nonhemorrhagic white matter infarct in the posterior left centrum semi ovale. 3. Remote white matter infarcts in the right corona radiata. This could be a watershed insult. 4. Moderate atrophy and white matter disease otherwise compatible with chronic microvascular ischemia CTA s: 1. Long segment atherosclerotic narrowing of the distal left common carotid artery through the left carotid bifurcation without a significant stenosis relative to the more distal vessel. Although there is no significant stenosis. The extensive luminal plaque could be a source for distal emboli. 2. Previous  right carotid endarterectomy is patent. 3. Atherosclerotic changes at the aortic arch and bilateral cavernous internal carotid arteries without significant stenosis. 4. 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 ECHO: Left ventricle: The cavity size was normal. There was moderate   concentric hypertrophy. Systolic function was moderately to   severely reduced. The estimated ejection fraction was in the   range of 30% to 35%. There is severe hypokinesis to akinesis of   the entire inferolateral, inferior, and inferoseptal myocardium LDL 42 mg% HbA1c 5.2  Physical Exam:    Pleasant middle-age male currently not in distress. . Afebrile. Head is nontraumatic. Neck is supple without bruit.    Cardiac exam no murmur or gallop. Lungs are clear to auscultation. Distal pulses are well felt. Neurological Exam :  Awake alert oriented 3 with normal speech and language function. Vision acuity seems adequate. Extraocular movements are full range but there is slight hypotropia of the left eye. Left palpebral fissure is slightly larger than the right. Mildly facial asymmetry when he is smiles. Tongue midline motor system exam shows mild right hemiparesis 4/5 strength in the right upper and3/5 strength in right lower extremities with weakness of right grip and intrinsic hand muscles.mild weakness in left lower extremity 4/5.deep tendon reflexes are depressed. Plantars are downgoing. Sensation is intact. Gait not tested. ASSESSMENT [name] is a 62 y.o. male with right hemiparesis secondary to left basal ganglia infarct from small vessel disease in the setting of hypotension as well as electrolyte disturbance and deconditioning and CHF.   Hospital day # 7  TREATMENT/PLAN  I have  personally examined this patient, reviewed notes, independently viewed imaging studies, participated in medical decision making and plan of care.ROS completed by me personally and pertinent  positives fully documented  I have made any additions or clarifications directly to the above note. Agree with note above.  He presented with right leg weakness in the setting of initially generalized weakness following hypertension and electrolyte derangement and CHF. Etiology of stroke is likely small vessel disease. Recommend dual antiplatelet therapy aspirin and Plavix for 3 weeks followed by Plavix alone. Physical occupational therapy and rehabilitation consult was. Aggressive risk factor modification.Stroke team will sign off. Kindly call for questions. Discussed with Dr. Lowell Guitar  I spent 25  minutes in total face-to-face time with the patient, more than 50% of which was spent in counseling and coordination of care, reviewing test results, reviewing medication and discussing or reviewing the diagnosis of lacunar infarct    , the prognosis and treatment options.   Delia Heady, MD Medical Director [facility] Pager: [phone] 04/11/2018 4:17 PM

## 2018-04-12 DIAGNOSIS — Z9981 Dependence on supplemental oxygen: Secondary | ICD-10-CM

## 2018-04-12 DIAGNOSIS — I6930 Unspecified sequelae of cerebral infarction: Secondary | ICD-10-CM

## 2018-04-12 DIAGNOSIS — N179 Acute kidney failure, unspecified: Secondary | ICD-10-CM

## 2018-04-12 DIAGNOSIS — I714 Abdominal aortic aneurysm, without rupture, unspecified: Secondary | ICD-10-CM

## 2018-04-12 DIAGNOSIS — J449 Chronic obstructive pulmonary disease, unspecified: Secondary | ICD-10-CM

## 2018-04-12 DIAGNOSIS — I25810 Atherosclerosis of coronary artery bypass graft(s) without angina pectoris: Secondary | ICD-10-CM

## 2018-04-12 DIAGNOSIS — M069 Rheumatoid arthritis, unspecified: Secondary | ICD-10-CM

## 2018-04-12 DIAGNOSIS — G894 Chronic pain syndrome: Secondary | ICD-10-CM

## 2018-04-12 DIAGNOSIS — R339 Retention of urine, unspecified: Secondary | ICD-10-CM

## 2018-04-12 DIAGNOSIS — Z720 Tobacco use: Secondary | ICD-10-CM

## 2018-04-12 DIAGNOSIS — I5042 Chronic combined systolic (congestive) and diastolic (congestive) heart failure: Secondary | ICD-10-CM

## 2018-04-12 DIAGNOSIS — I6330 Cerebral infarction due to thrombosis of unspecified cerebral artery: Secondary | ICD-10-CM

## 2018-04-12 DIAGNOSIS — D62 Acute posthemorrhagic anemia: Secondary | ICD-10-CM

## 2018-04-12 DIAGNOSIS — E875 Hyperkalemia: Secondary | ICD-10-CM

## 2018-04-12 LAB — BASIC METABOLIC PANEL
ANION GAP: 9 (ref 5–15)
BUN: 11 mg/dL (ref 6–20)
CALCIUM: 9.1 mg/dL (ref 8.9–10.3)
CO2: 29 mmol/L (ref 22–32)
Chloride: 100 mmol/L — ABNORMAL LOW (ref 101–111)
Creatinine, Ser: 0.8 mg/dL (ref 0.61–1.24)
GFR calc Af Amer: >60 mL/min (ref >60)
GLUCOSE: 102 mg/dL — AB (ref 65–99)
Potassium: 5.2 mmol/L — ABNORMAL HIGH (ref 3.5–5.1)
Sodium: 138 mmol/L (ref 135–145)

## 2018-04-12 LAB — CBC
HCT: 38.6 % — ABNORMAL LOW (ref 39.0–52.0)
Hemoglobin: 12.1 g/dL — ABNORMAL LOW (ref 13.0–17.0)
MCH: 28.3 pg (ref 26.0–34.0)
MCHC: 31.3 g/dL (ref 30.0–36.0)
MCV: 90.2 fL (ref 78.0–100.0)
PLATELETS: 266 10*3/uL (ref 150–400)
RBC: 4.28 MIL/uL (ref 4.22–5.81)
RDW: 14.5 % (ref 11.5–15.5)
WBC: 9.3 10*3/uL (ref 4.0–10.5)

## 2018-04-12 LAB — MAGNESIUM: Magnesium: 1.9 mg/dL (ref 1.7–2.4)

## 2018-04-12 LAB — POTASSIUM: POTASSIUM: 5.2 mmol/L — AB (ref 3.5–5.1)

## 2018-04-12 MED ORDER — POLYETHYLENE GLYCOL 3350 17 G PO PACK: 17.0000 g | PACK | Freq: Two times a day (BID) | ORAL | Status: DC

## 2018-04-12 MED ORDER — CARVEDILOL 3.125 MG PO TABS: 3.1250 mg | ORAL_TABLET | Freq: Two times a day (BID) | ORAL | 0 refills | Status: DC

## 2018-04-12 MED ORDER — COLLAGENASE 250 UNIT/GM EX OINT: TOPICAL_OINTMENT | Freq: Every day | CUTANEOUS | 0 refills | Status: DC

## 2018-04-12 MED ORDER — CLOPIDOGREL BISULFATE 75 MG PO TABS: 75.0000 mg | ORAL_TABLET | Freq: Every day | ORAL | 0 refills | Status: DC

## 2018-04-12 MED ORDER — POLYETHYLENE GLYCOL 3350 17 G PO PACK: 17.0000 g | PACK | Freq: Two times a day (BID) | ORAL | 0 refills | Status: AC

## 2018-04-12 MED ORDER — ASPIRIN 81 MG PO CHEW: 81.0000 mg | CHEWABLE_TABLET | Freq: Every day | ORAL | 0 refills | Status: DC

## 2018-04-12 MED ORDER — NICOTINE 7 MG/24HR TD PT24: 7.0000 mg | MEDICATED_PATCH | Freq: Every day | TRANSDERMAL | 0 refills | Status: DC

## 2018-04-12 NOTE — Progress Notes (Signed)
**Note De-Identified vi Obfusction** PROGRESS NOTE    [name]  YTK:[id number] DOB: 09-24-1956 DOA: 04/04/2018 PCP: Ryan Specter, PA-C   Brief Nrrtive:  62 y.o.WM PMHx CVA, AAA ,CAD, Chronic combined systolic nd distolic CHF(April 2018 EF 35 to 40%),MI,  ILD, Lung nodule,  (followed by Dr. [name] nd on chronic prednisone),Tobcco buse, MI, NSVT, Normocytic nemi,Pncretitis, RA (rheumtoid rthritis),  Presented to [facility] 6/3 due to generlized wekness. Per sister, he ws norml the dy prior nd when he woke up 6/3, he hd profound wekness. At bseline, pt is firly sedentry. He lives with sister who is primry cregiver. Sister sttes he cn perform most ADLs independently but does occsionlly need help.  On dy of presenttion, pt could brely get out of bed nd / or into his recliner nd due to fer of him flling while trying, sister clled EMS.  In ED, he ws found to hve hypotension (SBP in 70's), hypontremi, hyperklemi, AKI (SCr up to 8). He remined hypotensive despite 4L IVF nd ws subsequently strted on norepinephrine. CT of the hed ws negtive. He hd bldder scn tht showed ~500cc urine. After foley ws plced, he hs hd roughly 900cc UOP since rrivl in ED bout 7 hours go.  He denies ny recent fevers/chills/swets, hedches, chest pin, SOB, N/V/D, bd pin, mylgis, exposure to known sick contcts. He hs hd dry cough for 3 - 4 dys which hs not chnged. He hs been tking ll of his medictions s prescribed including nti-hypertensives. He hs been eting OK nd drinking firly well but per sister, hs not been drinking s much wter s he should (though does drink lots of te nd sod).  Assessment & Pln:   Principl Problem:   Shock (HCC) Active Problems:   CAD (coronry rtery disese)   ILD (interstitil lung disese) (HCC)   Chronic combined systolic nd distolic CHF (congestive hert filure) (HCC)   Leukocytosis   High nion gp metbolic  cidosis   Acute kidney injury (HCC)   Scrl ulcer, with unspecified severity (HCC)   Cerebrl thrombosis with cerebrl infrction  Hypovolemic shock  -Felt to be secondry to hypovolemi + component of drenl insufficiency lthough the inciting cuse for his hypovolemi is not immeditely cler to me on chrt review nd discussion with him.  His opites were incresed on 5/31, but his sister dministers this nd notes he did not hve ny extr doses.  Infection seems to hve been ruled out t this point with negtive blood nd urine cultures (scrl wounds don't pper infected).  Now on his home steroid dose fter stress dose steroids.  He hs dirrhe, but this seems to hve strted fter his hospitliztion.  - blood pressure better tody  Dirrhe  Constiption - dirrhe resolved, now constiption - negtive GI pth nd C diff - increse mirlx to BID   History of CVA  Acute Stroke : with residul L sided wekness, however, 6/9 hd wht seemed like more wekness on his right lower extremity (he noted he felt like this wekness my hve strted t his initil presenttion).  MRI showed infrct involving the medil L bsl gngli nd posterior L centrum semi ovle. - neuro c/s, pprecite recs - etiology of stroke likely smll vessel disese - ASA, plvix x3 weeks followed by plvix lone - torvsttin 40 - A1c 5.2, LDL 42 - CTA hed nd neck with therosclerotic nrrowing of distl L common crotid rtery through the L crotid bifurction without  significnt stenosis reltive to the more distl **Note De-Identified vi Obfusction** vessel.  "lthough there is no significnt stenosis, the extensive luminl plque could be  source for distl embolic" (discussed with neurology, stroke likely 2/2 smll vessel disese).  CTA hed/neck lso notble for high grde stenosis of proximl non dominnt L vertebrl rtery nd mild distl smll vessel disese within the circle of willis. - PT/OT/SLP - recommending CIR  ILD due to  RA nd mold exposure - Followed by Dr. [name] PCCM - On chronic steroids.  Home dose prednisone resumed. 10 mg dily (home dose) - Plquenil 200 mg BID - DuoNeb TID - Albuterol PRN   Chronic respirtory filure with hypoxi (home O2; 2 L O2 nocturnlly) -Multifctoril to include ILD, COPD, continued tobcco buse -Ptient counseled on need to bsolutely discontinue smoking. -Titrte O2 to mintin SPO2 89 to 93% - on O2 right now, will wen dytime O2 s tolerted (doing well on RA tody)  COPD/Tobcco buse - Nicotine ptch   Chronic systolic nd distolic CHF - 6/4 Echocrdiogrm: LVEF= 30 to 35%, grde 2 distolic dysfunction - Decrese coreg to 3.125 mg BID - holding spironolctone, entresto, lsix t this time - hold this t the moment, will need to grdully reintroduce these. -Strict in nd out -Dily weight - Trnsfuse for hemoglobin <8  Sinus Puse  Brdycrdi: stble with decresed crvedilol.  1.18 second puse noted on tele on 6/11, sx.  Continue to monitor with reduced dose crvedilol.  Elevted troponin  Demnd ischemi:  Echo with EF 30-35% (slightly decresed from EF 01/2017 with 35-40%).    -Felt secondry to stress (Type 2 NSTEMI with renl filure contributing) - would recommend outptient crdiology follow up - of note, with previous cth in 01/2017, recommended considering AICD if no improvement in LV function with medicl therpy - continue s nd torvsttin  Hx MI -ASA, torvsttin (nd plvix s bove)  Essentil HTN -coreg (decresed dose with soft BP nd brdycrdi) - holding spironolctone, entresto, lsix  CAD -S/P CABG -See CHF  AAA: noted  Hx NSVT: noted, none noted.  As bove, likely needs AICD t some point.  RIGHTStge II buttocks ulcer - Wound cre consult - Doesn't pper infected t this time.   - nutrition consult  Acute kidney injury - Due to shock bove, peked ~8, now bck to bseline  Reltive  drenl insufficiency  --Home dose steroids restrted.  Altered mentl sttus -Most likely secondry to hypoxi, ICU delirium, hypotension nd shock. - Resolved -Minimize sedting mediction.  Hyperklemi:  - mild, repet in PM  Gols of cre -PT/OT consult: Ptient with cute on chronic respirtory filure secondry to ILD, RA, tobcco buse, COPD evlute for CIR vs SNF  DVT prophylxis: heprin Code Sttus: full code Fmily Communiction: sister t bedside on 6/10 Disposition Pln: CIR   Consultnts:   PCCM  Procedures:  CT hed 6/3 > no cute process. CXR 6/3 > no cute process. Echo 6/4 >LVEF 30-35%, Grde 2 DD  Antimicrobils: Anti-infectives (From dmission, onwrd)   Strt     Dose/Rte Route Frequency Ordered Stop   04/07/18 1200  hydroxychloroquine (PLAQUENIL) tblet 200 mg     200 mg Orl 2 times dily 04/07/18 1119     04/06/18 1700  vncomycin (VANCOCIN) IVPB 1000 mg/200 mL premix  Sttus:  Discontinued     1,000 mg 200 mL/hr over 60 Minutes Intrvenous Every 48 hours 04/04/18 1653 04/06/18 0708   04/06/18 0900  pipercillin-tzobctm (ZOSYN) IVPB 3.375 g  Sttus:  Discontinued     3.375 g 12.5 mL/hr over **Note De-Identified vi Obfusction** 240 Minutes Intrvenous Every 8 hours 04/06/18 0708 04/06/18 0756   04/06/18 0800  vncomycin (VANCOCIN) IVPB 750 mg/150 ml premix  Sttus:  Discontinued     750 mg 150 mL/hr over 60 Minutes Intrvenous Every 12 hours 04/06/18 0708 04/06/18 0756   04/05/18 0100  pipercillin-tzobctm (ZOSYN) IVPB 2.25 g  Sttus:  Discontinued     2.25 g 100 mL/hr over 30 Minutes Intrvenous Every 8 hours 04/04/18 1653 04/06/18 0708   04/04/18 2145  vncomycin (VANCOCIN) IVPB 1000 mg/200 mL premix     1,000 mg 200 mL/hr over 60 Minutes Intrvenous  Once 04/04/18 2137 04/04/18 2255   04/04/18 1615  pipercillin-tzobctm (ZOSYN) IVPB 3.375 g     3.375 g 100 mL/hr over 30 Minutes Intrvenous  Once 04/04/18 1612 04/04/18 1856   04/04/18 1615  vncomycin  (VANCOCIN) IVPB 1000 mg/200 mL premix     1,000 mg 200 mL/hr over 60 Minutes Intrvenous  Once 04/04/18 1612 04/04/18 1857     Subjective: Doing well, no complints. No BM yet.  Pssing gs.  Objective: Vitls:   04/11/18 2357 04/12/18 0408 04/12/18 0801 04/12/18 0848  BP: 120/80 117/82 127/85   Pulse: 82 86 93 91  Resp: 18 18 20 18   Temp: 97.9 F (36.6 C) 97.7 F (36.5 C) 97.7 F (36.5 C)   TempSrc: Orl Orl Orl   SpO2: 92% 94% 96% 96%  Weight:  86.6 kg (191 lb)    Height:        Intke/Output Summry (Lst 24 hours) t 04/12/2018 0931 Lst dt filed t 04/12/2018 0700 Gross per 24 hour  Intke 1447.49 ml  Output 2750 ml  Net -1302.51 ml   Filed Weights   04/10/18 0524 04/11/18 0422 04/12/18 0408  Weight: 98 kg (216 lb) 100.7 kg (222 lb) 86.6 kg (191 lb)    Exmintion:  Generl: No cute distress. Crdiovsculr: Hert sounds show  regulr rte, nd rhythm. No gllops or rubs. No murmurs. No JVD. Lungs: Cler to usculttion bilterlly with good ir movement. No rles, rhonchi or wheezes. Abdomen: Soft, nontender, nondistended with norml ctive bowel sounds. No msses. No heptosplenomegly. Neurologicl: Alert nd oriented 3. L sided fcil droop.  Chronic L sided wekness.  R>L LE wekness. Skin: scrl decubitus ulcers visulized, do not pper infected Extremities: No clubbing or cynosis. No edem.  Psychitric: Mood nd ffect re norml. Insight nd judgment re pproprite.  Dt Reviewed: I hve personlly reviewed following lbs nd imging studies  CBC: Recent Lbs  Lb 04/08/18 0709 04/09/18 0631 04/10/18 0724 04/11/18 0444 04/12/18 0531  WBC 8.5 9.1 9.3 9.3 9.3  HGB 10.4* 10.1* 10.4* 11.3* 12.1*  HCT 33.3* 32.4* 33.3* 35.9* 38.6*  MCV 93.3 92.6 91.0 90.2 90.2  PLT 184 190 199 223 266   Bsic Metbolic Pnel: Recent Lbs  Lb 04/06/18 0445  04/08/18 0709 04/09/18 0631 04/10/18 0724 04/11/18 0444 04/12/18 0531  NA 142   < >  145 142 142 138 138  K 4.3   < > 3.8 3.7 3.8 4.3 5.2*  CL 108   < > 111 105 103 103 100*  CO2 25   < > 23 30 31 29 29   GLUCOSE 119*   < > 89 97 106* 82 102*  BUN 35*   < > 19 14 12 10 11   CREATININE 1.62*   < > 0.80 0.73 0.71 0.77 0.80  CALCIUM 8.2*   < > 8.5* 8.4* 8.3* 8.6* 9.1  MG 2.2   < >  1.8 1.8 1.7 1.7 1.9  PHOS 2.4*  --   --   --   --   --   --    < > = values in this interval not displayed.   GFR: Estimated Creatinine Clearance: 108.2 mL/min (by C-G formula based on SCr of 0.8 mg/dL). Liver Function Tests: Recent Labs  Lab 04/07/18 0317 04/09/18 0631 04/10/18 0724  AST 67* 33 24  ALT 45 34 31  ALKPHOS 64 58 56  BILITOT 0.6 0.8 0.7  PROT 6.1* 5.5* 5.3*  ALBUMIN 2.3* 2.1* 2.1*   No results for input(s): LIPASE, AMYLASE in the last 168 hours. No results for input(s): AMMONIA in the last 168 hours. Coagulation Profile: No results for input(s): INR, PROTIME in the last 168 hours. Cardiac Enzymes: Recent Labs  Lab 04/07/18 0317  TROPONINI 0.48*   BNP (last 3 results) No results for input(s): PROBNP in the last 8760 hours. HbA1C: Recent Labs    04/11/18 0444  HGBA1C 5.2   CBG: Recent Labs  Lab 04/05/18 1117 04/05/18 1548  GLUCAP 139* 121*   Lipid Profile: Recent Labs    04/11/18 0444  CHOL 90  HDL 21*  LDLCALC 42  TRIG 956  CHOLHDL 4.3   Thyroid Function Tests: No results for input(s): TSH, T4TOTAL, FREET4, T3FREE, THYROIDAB in the last 72 hours. Anemia Panel: No results for input(s): VITAMINB12, FOLATE, FERRITIN, TIBC, IRON, RETICCTPCT in the last 72 hours. Sepsis Labs: Recent Labs  Lab 04/06/18 0445  PROCALCITON 0.70    Recent Results (from the past 240 hour(s))  Blood Culture (routine x 2)     Status: None   Collection Time: 04/04/18  4:25 PM  Result Value Ref Range Status   Specimen Description BLOOD RIGHT ANTECUBITAL  Final   Special Requests   Final    BOTTLES DRAWN AEROBIC AND ANAEROBIC Blood Culture adequate volume   Culture    Final    NO GROWTH 5 DAYS Performed at [facility], [address] [place], [place] [address]    Report Status 04/09/2018 FINAL  Final  Blood Culture (routine x 2)     Status: None   Collection Time: 04/04/18  5:20 PM  Result Value Ref Range Status   Specimen Description BLOOD LEFT HAND  Final   Special Requests   Final    BOTTLES DRAWN AEROBIC ONLY Blood Culture results may not be optimal due to an inadequate volume of blood received in culture bottles   Culture   Final    NO GROWTH 5 DAYS Performed at [facility], [address] [place], [place] [address]    Report Status 04/09/2018 FINAL  Final  Urine culture     Status: None   Collection Time: 04/04/18  7:45 PM  Result Value Ref Range Status   Specimen Description URINE, RANDOM  Final   Special Requests NONE  Final   Culture   Final    NO GROWTH Performed at [facility], [address], [place], [place] [address]    Report Status 04/06/2018 FINAL  Final  Culture, blood (routine x 2)     Status: None   Collection Time: 04/05/18 12:54 AM  Result Value Ref Range Status   Specimen Description BLOOD RIGHT HAND  Final   Special Requests   Final    BOTTLES DRAWN AEROBIC ONLY Blood Culture adequate volume   Culture   Final    NO GROWTH 5 DAYS Performed at [facility] **Note De-Identified vi Obfusction** [facility], [address] [place], [place] [address]    Report Sttus 04/10/2018 FINL  Finl  Culture, blood (routine x 2)     Sttus: None   Collection Time: 04/05/18  1:11 M  Result Vlue Ref Rnge Sttus   Specimen Description BLOOD LEFT -LINE  Finl   Specil Requests   Finl    BOTTLES DRWN EROBIC ONLY Blood Culture dequte volume   Culture   Finl    NO GROWTH 5 DYS Performed t [facility], [address], [place], [place] [address]    Report Sttus 04/10/2018 FINL  Finl  MRS PCR Screening     Sttus: None   Collection Time: 04/05/18  4:04 M  Result Vlue Ref Rnge Sttus   MRS by PCR NEGTIVE NEGTIVE Finl     Comment:        The GeneXpert MRS ssy (FD pproved for NSL specimens only), is one component of  comprehensive MRS coloniztion surveillnce progrm. It is not intended to dignose MRS infection nor to guide or monitor tretment for MRS infections. Performed t [facility], [address] [place], [place] [address]   C difficile quick scn w PCR reflex     Sttus: None   Collection Time: 04/07/18  1:16 PM  Result Vlue Ref Rnge Sttus   C Diff ntigen NEGTIVE NEGTIVE Finl   C Diff toxin NEGTIVE NEGTIVE Finl   C Diff interprettion No C. difficile detected.  Finl    Comment: Performed t [facility], [address], [place], [place] [address]  Gstrointestinl Pnel by PCR , Stool     Sttus: None   Collection Time: 04/07/18  1:16 PM  Result Vlue Ref Rnge Sttus   Cmpylobcter species NOT DETECTED NOT DETECTED Finl   Plesimons shigelloides NOT DETECTED NOT DETECTED Finl   Slmonell species NOT DETECTED NOT DETECTED Finl   Yersini enterocolitic NOT DETECTED NOT DETECTED Finl   Vibrio species NOT DETECTED NOT DETECTED Finl   Vibrio cholere NOT DETECTED NOT DETECTED Finl   Enteroggregtive E coli (EEC) NOT DETECTED NOT DETECTED Finl   Enteropthogenic E coli (EPEC) NOT DETECTED NOT DETECTED Finl   Enterotoxigenic E coli (ETEC) NOT DETECTED NOT DETECTED Finl   Shig like toxin producing E coli (STEC) NOT DETECTED NOT DETECTED Finl   Shigell/Enteroinvsive E coli (EIEC) NOT DETECTED NOT DETECTED Finl   Cryptosporidium NOT DETECTED NOT DETECTED Finl   Cyclospor cyetnensis NOT DETECTED NOT DETECTED Finl   Entmoeb histolytic NOT DETECTED NOT DETECTED Finl   Girdi lmbli NOT DETECTED NOT DETECTED Finl   denovirus F40/41 NOT DETECTED NOT DETECTED Finl   strovirus NOT DETECTED NOT DETECTED Finl   Norovirus GI/GII NOT DETECTED NOT DETECTED Finl   Rotvirus  NOT DETECTED NOT DETECTED Finl   Spovirus (I, II, IV,  nd V) NOT DETECTED NOT DETECTED Finl    Comment: Performed t [facility], [address] [place], [place] [address]         Rdiology Studies: Ct ngio Hed W Or Wo Contrst  Result Dte: 04/10/2018 CLINICL DT:  Left bsl gngli nd centrum semi ovle infrcts. Stroke follow-up. Wekness. EXM: CT NGIOGRPHY HED ND NECK TECHNIQUE: Multidetector CT imging of the hed nd neck ws performed using the stndrd protocol during bolus dministrtion of intrvenous contrst. Multiplnr CT imge reconstructions nd MIPs were obtined to evlute the vsculr ntomy. Crotid stenosis mesurements (when pplicble) re obtined utilizing NSCET criteri, using the **Note De-Identified vi Obfusction** distl internl crotid dimeter s the denomintor. CONTRAST:  63mL ISOVUE-370 IOPAMIDOL (ISOVUE-370) INJECTION 76% COMPARISON:  MRI brin 04/10/2018 FINDINGS: CT HEAD FINDINGS Brin: Bilterl bsl gngli infrcts re noted. Remote right coron rdit infrcts re present. The punctte left prietl white mtter infrct is not pprecited by CT. Ventricles re of norml size. No significnt extr-xil fluid collection is present. There is no cute hemorrhge. Vsculr: Atherosclerotic clcifictions re present within the cvernous internl crotid rteries. There is no hyperdense vessel. Skull: Clvrium is intct. No focl lytic or blstic lesions re present. ORIF of the left orbitl rim is noted. Sinuses: The prnsl sinuses nd mstoid ir cells re cler. Orbits: Globes nd orbits re within norml limits. Review of the MIP imges confirms the bove findings CTA NECK FINDINGS Aortic rch: Ptient is sttus post medin sternotomy for CABG. Atherosclerotic chnges re noted t the origins the gret vessels without significnt stenosis reltive to the more distl vessels. Right crotid system: The right common crotid rtery demonstrtes some therosclerotic chnge without focl stenosis. The right crotid bifurction  is ptulous, suggesting previous endrterectomy more distl cervicl right ICA is norml. Left crotid system: The left common crotid rtery demonstrtes distl therosclerotic plque. Additionl clcified nd nonclcified plque is present t the left crotid bifurction. Miniml luminl dimeter is 3.5 mm. There is no significnt stenosis reltive to the more distl vessel. The more distl left common crotid rtery is within norml limits. Vertebrl rteries: The right vertebrl rtery is the dominnt vessel. Dense clcifiction is present t the origin of the non dominnt left vertebrl rtery. There is no significnt stenosis t the origin of the dominnt right vertebrl rtery. No other significnt stenoses re present in either vertebrl rtery in the neck. Clcifictions re present long the wll of the right vertebrl rtery t the C1 level. Skeleton: Rightwrd curvture is present in the cervicl spine. Asymmetric left-sided fcet spurring is noted. Vertebrl body heights nd lignment re mintined. No focl lytic or blstic lesions re present. Other neck: No focl mucosl or submucosl lesions re present. Thyroid is within norml limits. No significnt cervicl denopthy is present. Slivry glnds re within norml limits bilterlly. Upper chest: Peripherl interstitil lung chnges re present bilterlly. No focl nodule, mss, or irspce disese is present. The thorcic inlet is within norml limits. Review of the MIP imges confirms the bove findings CTA HEAD FINDINGS Anterior circultion: Atherosclerotic chnges re present within the cvernous internl crotid rteries bilterlly without  significnt stenosis reltive to the more distl vessel. The ICA termini re within norml limits bilterlly. The A1 nd M1 segments re norml. The nterior communicting rtery is ptent. MCA bifurctions re within norml limits. ACA nd MCA brnch vessels demonstrte mild distl ttenution  without  significnt proximl stenosis or occlusion. No significnt neurysm is present. Posterior circultion: The right vertebrl rtery is the dominnt vessel. PICA origins re visulized nd norml. Bsilr rtery is smll distlly. Right posterior cerebrl rtery is of fetl type. The left posterior cerebrl rtery fills from the bsilr tip with contribution from  left posterior communicting rtery. The PCA brnch vessels re within norml limits. Venous sinuses: The durl sinuses re ptent. The stright sinus nd deep cerebrl veins re intct. Corticl veins re unremrkble. Antomic vrints: Fetl type right posterior cerebrl rtery. Prominent left posterior communicting rtery. Delyed phse: The postcontrst imges ccentute white mtter disese nd previous white mtter infrcts. No pthologic enhncement is present. Review of the MIP imges confirms the bove findings IMPRESSION: 1. Long segment therosclerotic **Note De-Identified vi Obfusction** nrrowing of the distl left common crotid rtery through the left crotid bifurction without  significnt stenosis reltive to the more distl vessel. Although there is no significnt stenosis. The extensive luminl plque could be  source for distl emboli. 2. 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 3. 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 4. 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 5. Mild distl smll vessel disese within the circle-of-Willis without significnt proximl stenosis, neurysm, or brnch vessel occlusion. 6. 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 7. Chnges of interstitil lung disese noted. Electroniclly Signed   By: Mrin Roberts M.D.   On: 04/10/2018 19:59   Ct Angio Neck W Or Wo Contrst  Result Dte: 04/10/2018 CLINICAL DATA:  Left bsl gngli nd  centrum semi ovle infrcts. Stroke follow-up. Wekness. EXAM: CT ANGIOGRAPHY HEAD AND NECK TECHNIQUE: Multidetector CT imging of the hed nd neck ws performed using the stndrd protocol during bolus dministrtion of intrvenous contrst. Multiplnr CT imge reconstructions nd MIPs were obtined to evlute the vsculr ntomy. Crotid stenosis mesurements (when pplicble) re obtined utilizing NASCET criteri, using the distl internl crotid dimeter s the denomintor. CONTRAST:  40mL ISOVUE-370 IOPAMIDOL (ISOVUE-370) INJECTION 76% COMPARISON:  MRI brin 04/10/2018 FINDINGS: CT HEAD FINDINGS Brin: Bilterl bsl gngli infrcts re noted. Remote right coron rdit infrcts re present. The punctte left prietl white mtter infrct is not pprecited by CT. Ventricles re of norml size. No significnt extr-xil fluid collection is present. There is no cute hemorrhge. Vsculr: Atherosclerotic clcifictions re present within the cvernous internl crotid rteries. There is no hyperdense vessel. Skull: Clvrium is intct. No focl lytic or blstic lesions re present. ORIF of the left orbitl rim is noted. Sinuses: The prnsl sinuses nd mstoid ir cells re cler. Orbits: Globes nd orbits re within norml limits. Review of the MIP imges confirms the bove findings CTA NECK FINDINGS Aortic rch: Ptient is sttus post medin sternotomy for CABG. Atherosclerotic chnges re noted t the origins the gret vessels without significnt stenosis reltive to the more distl vessels. Right crotid system: The right common crotid rtery demonstrtes some therosclerotic chnge without focl stenosis. The right crotid bifurction is ptulous, suggesting previous endrterectomy more distl cervicl right ICA is norml. Left crotid system: The left common crotid rtery demonstrtes distl therosclerotic plque. Additionl clcified nd nonclcified plque is present t the left crotid  bifurction. Miniml luminl dimeter is 3.5 mm. There is no significnt stenosis reltive to the more distl vessel. The more distl left common crotid rtery is within norml limits. Vertebrl rteries: The right vertebrl rtery is the dominnt vessel. Dense clcifiction is present t the origin of the non dominnt left vertebrl rtery. There is no significnt stenosis t the origin of the dominnt right vertebrl rtery. No other significnt stenoses re present in either vertebrl rtery in the neck. Clcifictions re present long the wll of the right vertebrl rtery t the C1 level. Skeleton: Rightwrd curvture is present in the cervicl spine. Asymmetric left-sided fcet spurring is noted. Vertebrl body heights nd lignment re mintined. No focl lytic or blstic lesions re present. Other neck: No focl mucosl or submucosl lesions re present. Thyroid is within norml limits. No significnt cervicl denopthy is present. Slivry glnds re within norml limits bilterlly. Upper chest: Peripherl interstitil lung chnges re present bilterlly. No focl nodule, mss, or irspce disese is present. The thorcic inlet is **Note De-Identified vi Obfusction** within norml limits. Review of the MIP imges confirms the bove findings CTA HEAD FINDINGS Anterior circultion: Atherosclerotic chnges re present within the cvernous internl crotid rteries bilterlly without  significnt stenosis reltive to the more distl vessel. The ICA termini re within norml limits bilterlly. The A1 nd M1 segments re norml. The nterior communicting rtery is ptent. MCA bifurctions re within norml limits. ACA nd MCA brnch vessels demonstrte mild distl ttenution without  significnt proximl stenosis or occlusion. No significnt neurysm is present. Posterior circultion: The right vertebrl rtery is the dominnt vessel. PICA origins re visulized nd norml. Bsilr rtery is smll distlly. Right posterior cerebrl  rtery is of fetl type. The left posterior cerebrl rtery fills from the bsilr tip with contribution from  left posterior communicting rtery. The PCA brnch vessels re within norml limits. Venous sinuses: The durl sinuses re ptent. The stright sinus nd deep cerebrl veins re intct. Corticl veins re unremrkble. Antomic vrints: Fetl type right posterior cerebrl rtery. Prominent left posterior communicting rtery. Delyed phse: The postcontrst imges ccentute white mtter disese nd previous white mtter infrcts. No pthologic enhncement is present. Review of the MIP imges confirms the bove findings IMPRESSION: 1. 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 Although there is no significnt stenosis. The extensive luminl plque could be  source for distl emboli. 2. 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 3. 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 4. 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 5. Mild distl smll vessel disese within the circle-of-Willis without significnt proximl stenosis, neurysm, or brnch vessel occlusion. 6. 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 7. Chnges of interstitil lung disese noted. Electroniclly Signed   By: Mrin Roberts M.D.   On: 04/10/2018 19:59   Mr Brin Wo Contrst  Result Dte: 04/10/2018 CLINICAL DATA:  Neuro deficits, subcute.  [place]. EXAM: MRI HEAD WITHOUT CONTRAST TECHNIQUE: Multiplnr, multiecho pulse sequences of the brin nd surrounding structures were obtined without intrvenous contrst. COMPARISON:  CT hed without contrst 04/04/2018. FINDINGS: Brin: 2 punctte foci of restricted  diffusion re comptible with cute nonhemorrhgic infrcts. These involve the medil left lentiform nucleus nd posterior left centrum semi ovle. No cute hemorrhge or mss lesion is present. Remote infrcts of the right right rdit re noted. The ventricles re proportionte to the degree of trophy. No significnt extr-xil fluid collection is present. Vsculr: Flow is present in the mjor intrcrnil rteries. Skull nd upper cervicl spine: The skull bse is within norml limits. The crniocervicl junction is norml. Sinuses/Orbits: The prnsl sinuses re cler. There is some fluid in the inferior mstoid ir cells bilterlly. No obstructing nsophryngel lesion is present. Globes nd orbits re within norml limits. IMPRESSION: 1. Punctte nonhemorrhgic infrct involving the medil left bsl gngli. 2. Punctte nonhemorrhgic white mtter infrct in the posterior left centrum semi ovle. 3. Remote white mtter infrcts in the right coron rdit. This could be  wtershed insult. 4. Moderte trophy nd white mtter disese otherwise comptible with chronic microvsculr ischemi. Electroniclly Signed   By: Mrin Roberts M.D.   On: 04/10/2018 15:50        Scheduled Meds: .  stroke: mpping our erly stges of recovery book   Does not pply Once  . spirin  81 mg Orl Dily  .  atorvastatin  40 mg Oral q1800  . carvedilol  3.125 mg Oral BID WC  . clopidogrel  75 mg Oral Daily  . collagenase   Topical Daily  . feeding supplement (ENSURE ENLIVE)  237 mL Oral TID BM  . heparin  5,000 Units Subcutaneous Q8H  . hydroxychloroquine  200 mg Oral BID  . ipratropium-albuterol  3 mL Nebulization TID  . mouth rinse  15 mL Mouth Rinse BID  . multivitamin with minerals  1 tablet Oral Daily  . nicotine  7 mg Transdermal Daily  . pantoprazole  40 mg Oral Q1200  . polyethylene glycol  17 g Oral BID  . predniSONE  10 mg Oral Q breakfast   Continuous Infusions: . sodium chloride  Stopped (04/05/18 0154)  . lactated ringers 50 mL/hr at 04/11/18 2120     LOS: 8 days    Time spent: over 30 min    Lacretia Nicks, MD [facility] Pager [phone]  If 7PM-7AM, please contact night-coverage [email or web address] Password [facility] 04/12/2018, 9:31 AM

## 2018-04-12 NOTE — Progress Notes (Signed)
Inpatient Rehabilitation Admissions Coordinator  I met with patient and his sister at bedside to discuss goals and expectations of an inpt rehab admit. Sister states she is unable to provide any physical assist due to her bad hip and she is to meet with a surgeon to consider operative management.. She and patient are requesting SNF for pt. I have updated SW, Kathlee Nations, and we will sign off at this time.  Danne Baxter, RN, MSN Rehab Admissions Coordinator [phone] 04/12/2018 1:21 PM

## 2018-04-12 NOTE — Progress Notes (Signed)
[facility] is here to transport patient to [facility]...information packet given to transport, iv sites removed, patient questions answered. Vital signs are stable and patient is ready to go.

## 2018-04-12 NOTE — Clinical Social Work Placement (Addendum)
Nurse to call report to [phone], ask for [facility] Nurse    CLINICAL SOCIAL WORK PLACEMENT  NOTE  Date:  04/12/2018  Patient Details  Name: SAAD BUHL MRN: [id number] Date of Birth: [birth year]  Clinical Social Work is seeking post-discharge placement for this patient at the Skilled  Nursing Facility level of care (*CSW will initial, date and re-position this form in  chart as items are completed):  Yes   Patient/family provided with [facility] Clinical Social Work Department's list of facilities offering this level of care within the geographic area requested by the patient (or if unable, by the patient's family).  Yes   Patient/family informed of their freedom to choose among providers that offer the needed level of care, that participate in Medicare, Medicaid or managed care program needed by the patient, have an available bed and are willing to accept the patient.  Yes   Patient/family informed of [place]'s ownership interest in [facility] and [facility], as well as of the fact that they are under no obligation to receive care at these facilities.  PASRR submitted to EDS on       PASRR number received on 04/11/18     Existing PASRR number confirmed on       FL2 transmitted to all facilities in geographic area requested by pt/family on 04/11/18     FL2 transmitted to all facilities within larger geographic area on       Patient informed that his/her managed care company has contracts with or will negotiate with certain facilities, including the following:        Yes   Patient/family informed of bed offers received.  Patient chooses bed at [facility]     Physician recommends and patient chooses bed at      Patient to be transferred to [facility] on 04/12/18.  Patient to be transferred to facility by [facility]     Patient family notified on 04/12/18 of transfer.  Name of family member notified:        PHYSICIAN       Additional Comment:     _______________________________________________ Baldemar Lenis, LCSW 04/12/2018, 4:35 PM

## 2018-04-12 NOTE — Discharge Summary (Addendum)
Physician Discharge Summary  [name] VBT:[id number] DOB: [birth year] DOA: 04/04/2018  PCP: [name], PA-C  Admit date: 04/04/2018 Discharge date: 04/12/2018  Time spent: 40 minutes  Recommendations for Outpatient Follow-up:  1. Follow outpatient CBC/CMP (K mildly elevated today, would repeat labs tomorrow) 2. Follow weight and blood pressure.  His coreg was restarted at a lower dose.  Holding his entresto, lasix and spironolactone at this time given hypotension at presentation.  Would gradually reintroduce these as his blood pressure allows. 3. Follow up with cardiology as outpatient.  Pt with elevated troponin thought 2/2 demand ischemia here.  He likely needs ischemia evaluation at some point.  Pt is also ICD candidate, so needs follow up regarding this as well.  4. Continue aggressive wound care (see instructions below) 5. Follow up with neurology  Discharge Diagnoses:  Principal Problem:   Shock ([place]) Active Problems:   CAD (coronary artery disease)   ILD (interstitial lung disease) (HCC)   Chronic combined systolic and diastolic CHF (congestive heart failure) (HCC)   Leukocytosis   High anion gap metabolic acidosis   Acute kidney injury ([place])   Sacral ulcer, with unspecified severity (HCC)   Cerebral thrombosis with cerebral infarction   AKI (acute kidney injury) ([place])   Urinary retention   Rheumatoid arthritis ([place])   Chronic obstructive pulmonary disease (HCC)   Supplemental oxygen dependent   Tobacco abuse   AAA (abdominal aortic aneurysm) without rupture (HCC)   History of CVA with residual deficit   Acute blood loss anemia   Hyperkalemia   Discharge Condition: stable  Diet recommendation: heart healthy  Filed Weights   04/10/18 0524 04/11/18 0422 04/12/18 0408  Weight: 98 kg (216 lb) 100.7 kg (222 lb) 86.6 kg (191 lb)    History of present illness:  Per HPI 62 y.o.WM PMHxCVA, AAA,CAD,Chronic combined systolic and diastolic CHF(April 9774 EF  35 to 40%),MI,ILD,Lung nodule, (followed by Dr. Park Breed on chronic prednisone),Tobacco abuse,MI,NSVT,Normocytic anemia,Pancreatitis, RA (rheumatoid arthritis),  Presented to [facility] ED 6/3 due to generalized weakness. Per sister, he was normal the day prior and when he woke up 6/3, he had profound weakness. At baseline, pt is fairly sedentary. He lives with sister who is primary caregiver. Sister states he can perform most ADLs independently but does occasionally need help.  On day of presentation, pt could barely get out of bed and / or into his recliner and due to fear of him falling while trying, sister called EMS.  In ED, he was found to have hypotension (SBP in 70's), hyponatremia, hyperkalemia, AKI (SCr up to 8). He remained hypotensive despite 4L IVF and was subsequently started on norepinephrine. CT of the head was negative. He had bladder scan that showed ~500cc urine. After foley was placed, he has had roughly 900cc UOP since arrival in ED about 7 hours ago.  He denies any recent fevers/chills/sweats, headaches, chest pain, SOB, N/V/D, abd pain, myalgias, exposure to known sick contacts. He has had dry cough for 3 - 4 days which has not changed. He has been taking all of his medications as prescribed including anti-hypertensives. He has been eating OK and drinking fairly well but per sister, has not been drinking as much water as he should (though does drink lots of tea and soda).  His shock was thought to be 2/2 hypovolemia and a component of adrenal insufficiency.  He was initially on broad spectrum antibiotics, though these were discontinued without a source for infection.  He had an elevated  troponin, thought 2/2 demand ischemia.  He had an echo which showed an EF of 30-35% (reduced compared to priors - though echo from 11/2016 showed EF of ~28%).  His heart failure and BP meds were held.  He was noted to have R leg weakness and an MRI was obtained which showed an acute  stroke.  He was seen by neurology and started on aspirin and plavix.  He was seen by PT/OT/SLP who recommended CIR, but his sister would not be able to care for him at home after acute inpatient rehab, so he was discharged to SNF.  He has been hemodynamically stable for the past several days.   See below for additional details.  Hospital Course:  Hypovolemic shock  -Felt to be secondary to hypovolemia+component of adrenal insufficiency although the inciting cause for his hypovolemia is not immediately clear to me on chart review and discussion with him.  His opiates were increased on 5/31, but his sister administers this and notes he did not have any extra doses.  Infection seems to have been ruled out at this point with negative blood and urine cultures (sacral wounds don't appear infected).  Now on his home steroid dose after stress dose steroids.  He has diarrhea, but this seems to have started after his hospitalization.  - blood pressure stable today - home BP and heart failure meds are currently being held and should gradually be increased  Diarrhea  Constipation - diarrhea resolved, now constipation - negative GI path and C diff - increase miralax to BID   History of CVA  Acute Stroke : with residual L sided weakness, however, 6/9 had what seemed like more weakness on his right lower extremity (he noted he felt like this weakness may have started at his initial presentation).  MRI showed infarct involving the medial L basal ganglia and posterior L centrum semi ovale (as well as remote white matter infarcts in R corona radiata - could be watershed insult.  Also had chronic microvascular ischemia). - neuro c/s, appreciate recs - etiology of stroke likely small vessel disease - ASA, plavix x3 weeks followed by plavix alone - atorvastatin 40 mg - A1c 5.2, LDL 42 - CTA head and neck with atherosclerotic narrowing of distal L common carotid artery through the L carotid bifurcation without a  significant stenosis relative to the more distal vessel.  "although there is no significant stenosis, the extensive luminal plaque could be a source for distal embolic" (discussed with neurology, stroke likely 2/2 small vessel disease).  CTA head/neck also notable for high grade stenosis of proximal non dominant L vertebral artery and mild distal small vessel disease within the circle of willis. - PT/OT/SLP - recommending CIR, but will pursue SNF since sister can't provide any physical assistance at home after inpatient rehab due to "bad hip"  ILD due to RA and mold exposure - Followed byDr. RamaswamyPCCM - On chronic steroids. Home dose prednisone resumed. 10 mg daily (home dose) - Plaquenil200 mg BID - DuoNebTID - AlbuterolPRN  Chronic respiratory failure with hypoxia (home O2;2 L O2nocturnally) -Multifactorial to include ILD, COPD, continued tobacco abuse -Patient counseled on need to absolutely discontinue smoking. -Titrate O2 to maintain SPO2 89 to 93% - on O2 right now, will wean daytime O2 as tolerated (doing well on RA today)  COPD/Tobacco abuse - Nicotine patch   Chronic systolic and diastolic CHF - 5/8IFOYDXAJOINOMV: LVEF=30 to 67%, grade 2 diastolic dysfunction (see report) - Decrease coreg to 3.125 mg BID -  holding spironolactone, entresto, lasix at this time - hold this at the moment, will need to gradually reintroduce these. -Strict in and out -Daily weight - Transfuse for hemoglobin <8  Sinus Pause  Bradycardia: stable with decreased carvedilol.  1.18 second pause noted on tele on 6/11, asx.  Continue to monitor with reduced dose carvedilol.  Elevated troponin  Demand ischemia:  Echo with EF 30-35% (slightly decreased from EF 01/2017 with 35-40% - but had echo in 11/2016 with EF estimated ~28%).    -Felt secondary to stress (Type 2 NSTEMI with renal failure contributing) - would recommend outpatient cardiology follow up - of note, with previous cath in  01/2017, recommended considering AICD if no improvement in LV function with medical therapy - continue asa and atorvastatin  Hx MI -ASA, atorvastatin (and plavix as above)  Essential HTN -coreg (decreased dose with soft BP and bradycardia) - holding spironolactone, entresto, lasix  CAD -S/P CABG -See CHF  AAA: noted.  Outpatient follow up.  Hx NSVT: noted, none noted.  As above, likely needs AICD at some point.  RIGHTStage II buttocks ulcer - Wound care consult - continue aggressive wound care (see below).  Frequent repositioning. - Doesn't appear infected at this time.   - nutrition consult  Acute kidney injury - Due to shock above, peaked ~8, now back to baseline  Relative adrenal insufficiency  --Home dose steroids restarted.  Altered mental status -Most likely secondary to hypoxia, ICU delirium, hypotension and shock. - Resolved -Minimize sedating medication.  Hyperkalemia:  - mild, repeat in PM  Procedures: CT head 6/3 > no acute process. CXR 6/3 > no acute process. Echo 6/4 >LVEF 30-35%, Grade 2 DD  Study Conclusions  - Left ventricle: The cavity size was normal. There was moderate   concentric hypertrophy. Systolic function was moderately to   severely reduced. The estimated ejection fraction was in the   range of 30% to 35%. There is severe hypokinesis to akinesis of   the entire inferolateral, inferior, and inferoseptal myocardium.   Features are consistent with a pseudonormal left ventricular   filling pattern, with concomitant abnormal relaxation and   increased filling pressure (grade 2 diastolic dysfunction).   Doppler parameters are consistent with high ventricular filling   pressure. - Aortic valve: Trileaflet; normal thickness, mildly calcified   leaflets. Valve area (VTI): 2.99 cm^2. Valve area (Vmax): 2.7   cm^2. Valve area (Vmean): 3.03 cm^2. - Mitral valve: There was mild regurgitation. - Left atrium: The atrium was mildly  dilated. - Right atrium: The atrium was mildly dilated. - Tricuspid valve: There was mild regurgitation. - Pulmonary arteries: PA peak pressure: 37 mm Hg (S).  Impressions:  - The right ventricular systolic pressure was increased consistent   with mild pulmonary hypertension.  Consultations:  PCCM  Discharge Exam: Vitals:   04/12/18 1513 04/12/18 1555  BP:  (!) 130/97  Pulse: 87 80  Resp: 18 20  Temp:  98.1 F (36.7 C)  SpO2: 96% 96%   No complaints. No BM yet  General: No acute distress. Cardiovascular: Heart sounds show a regular rate, and rhythm. No gallops or rubs. No murmurs. No JVD. Lungs: Clear to auscultation bilaterally with good air movement. No rales, rhonchi or wheezes. Abdomen: Soft, nontender, nondistended with normal active bowel sounds. No masses. No hepatosplenomegaly. Neurological: Alert and oriented 3. L sided facial droop.  Chronic L sided weakness.  R>L LE weakness. Skin: sacral decubitus ulcers visualized, do not appear infected Extremities: No clubbing or  cyanosis. No edema.  Psychiatric: Mood and affect are normal. Insight and judgment are appropriate.  Discharge Instructions   Discharge Instructions    (HEART FAILURE PATIENTS) Call MD:  Anytime you have any of the following symptoms: 1) 3 pound weight gain in 24 hours or 5 pounds in 1 week 2) shortness of breath, with or without a dry hacking cough 3) swelling in the hands, feet or stomach 4) if you have to sleep on extra pillows at night in order to breathe.   Complete by:  As directed    Ambulatory referral to Neurology   Complete by:  As directed    An appointment is requested in approximately: 4 weeks   Call MD for:  difficulty breathing, headache or visual disturbances   Complete by:  As directed    Call MD for:  extreme fatigue   Complete by:  As directed    Call MD for:  hives   Complete by:  As directed    Call MD for:  persistant dizziness or light-headedness   Complete by:  As  directed    Call MD for:  persistant nausea and vomiting   Complete by:  As directed    Call MD for:  redness, tenderness, or signs of infection (pain, swelling, redness, odor or green/yellow discharge around incision site)   Complete by:  As directed    Call MD for:  severe uncontrolled pain   Complete by:  As directed    Call MD for:  temperature >100.4   Complete by:  As directed    Diet - low sodium heart healthy   Complete by:  As directed    Discharge instructions   Complete by:  As directed    You were seen for severe hypotension.  This improved after you were given fluids, IV steroids, and agents to keep your blood pressure up.  It's unclear what ultimately lead to your low blood pressure, but we'll hold your blood pressure medications for now.    You should follow up with cardiology within the next week or so.  They may talk to you about additional tests (you had an elevated troponin, which we think was due to your severe hypotension - so they may discuss work up for this) or an AICD.   Watch your weight and your blood pressure.  We stopped your blood pressure agents because of low blood pressures when you came in.  Your heart failure medications should be gradually restarted.  Please follow up with your outpatient providers to reintroduce these medications.  You were started on aspirin and plavix for your stroke.  Take both for 3 weeks then take plavix only and stop aspirin.  Return if you have new, recurrent, or worsening symptoms.  Please ask your PCP to request records from this hospitalization so they know what was done and what the next steps will be.   Discharge wound care:   Complete by:  As directed    Foam dressing to buttocks.  Use Criticaid Clear to scrotum and wound areas on the buttocks/sacrum.  Lift the foam dressings and apply Criticaid Clear, then return the dressings to place.  The foam dressings can be left in place for up to 3 days.  Frequent turn (q2 hours),  limit supine position.  Low air loss mattress.  Apply Santyl to sacrum/gluteal fold areas Q day, and cover with moist fluffed gauze and foam dressing.  (Change foam dressing Q 3 days or PRN soiling.)  Increase activity slowly   Complete by:  As directed      Allergies as of 04/12/2018   No Known Allergies     Medication List    STOP taking these medications   amoxicillin 500 MG capsule Commonly known as:  AMOXIL   furosemide 20 MG tablet Commonly known as:  LASIX   losartan 25 MG tablet Commonly known as:  COZAAR   sacubitril-valsartan 49-51 MG Commonly known as:  ENTRESTO   spironolactone 25 MG tablet Commonly known as:  ALDACTONE     TAKE these medications   acetaminophen 500 MG tablet Commonly known as:  TYLENOL Take 1,000 mg by mouth every 6 (six) hours as needed for mild pain.   albuterol 0.63 MG/3ML nebulizer solution Commonly known as:  ACCUNEB Take 3 mLs by nebulization every 6 (six) hours as needed for wheezing.   PROVENTIL HFA 108 (90 Base) MCG/ACT inhaler Generic drug:  albuterol INHALE 2 PUFFS EVERY 6 HOURS AS NEEDED FOR WHEEZE/SHORTNESS OF BREATH   aspirin 81 MG chewable tablet Chew 1 tablet (81 mg total) by mouth daily. Take for 3 weeks, then stop and take plavix only What changed:    additional instructions  Another medication with the same name was removed. Continue taking this medication, and follow the directions you see here.   atorvastatin 40 MG tablet Commonly known as:  LIPITOR Take 1 tablet (40 mg total) by mouth daily at 6 PM.   carvedilol 3.125 MG tablet Commonly known as:  COREG Take 1 tablet (3.125 mg total) by mouth 2 (two) times daily with a meal. What changed:    medication strength  how much to take   clopidogrel 75 MG tablet Commonly known as:  PLAVIX Take 1 tablet (75 mg total) by mouth daily. Start taking on:  04/13/2018   collagenase ointment Commonly known as:  SANTYL Apply topically daily. Apply Santyl to  sacrum/gluteal fold areas Q day, and cover with moist fluffed gauze and foam dressing.  (Change foam dressing Q 3 days or PRN soiling.) Start taking on:  04/13/2018   gabapentin 300 MG capsule Commonly known as:  NEURONTIN Take 1 capsule (300 mg total) by mouth every 8 (eight) hours.   HYDROcodone-acetaminophen 10-325 MG tablet Commonly known as:  NORCO Take 1 tablet by mouth 3 (three) times daily as needed for severe pain.   hydroxychloroquine 200 MG tablet Commonly known as:  PLAQUENIL Take 200 mg by mouth 2 (two) times daily.   MUSCLE RUB EX Apply 1 application topically as needed (for shoulder or knee pain).   nicotine 7 mg/24hr patch Commonly known as:  NICODERM CQ - dosed in mg/24 hr Place 1 patch (7 mg total) onto the skin daily. Start taking on:  04/13/2018   OXYGEN Inhale 2 L into the lungs at bedtime.   polyethylene glycol packet Commonly known as:  MIRALAX / GLYCOLAX Take 17 g by mouth 2 (two) times daily. Until having 1 soft BM daily, then can decrease to daily or as needed   predniSONE 10 MG tablet Commonly known as:  DELTASONE Take 10 mg daily            Discharge Care Instructions  (From admission, onward)        Start     Ordered   04/12/18 0000  Discharge wound care:    Comments:  Foam dressing to buttocks.  Use Criticaid Clear to scrotum and wound areas on the buttocks/sacrum.  Lift the foam dressings and  apply Criticaid Clear, then return the dressings to place.  The foam dressings can be left in place for up to 3 days.  Frequent turn (q2 hours), limit supine position.  Low air loss mattress.  Apply Santyl to sacrum/gluteal fold areas Q day, and cover with moist fluffed gauze and foam dressing.  (Change foam dressing Q 3 days or PRN soiling.)   04/12/18 1601     No Known Allergies  Contact information for follow-up providers    [name], PA-C Follow up.   Specialty:  Physician Assistant Contact information: [place] [address] [phone]        Garvin Fila, MD Follow up.   Specialties:  Neurology, Radiology Why:  Follow up in about 4 weeks Contact information: [place] [address] [phone]        Charlie Pitter, PA-C Follow up.   Specialties:  Cardiology, Radiology Why:  [facility] - 04/28/18 at 11:30am. Lisbeth Renshaw is one of the PAs that works with Dr. Meda Coffee (you have met before). Arrive 15 minutes prior to appointment to check in. Contact information: [address] [place] [address] [phone]            Contact information for after-discharge care    [facility] .   Service:  Skilled Nursing Contact information: [place] [phone]                   The results of significant diagnostics from this hospitalization (including imaging, microbiology, ancillary and laboratory) are listed below for reference.    Significant Diagnostic Studies: Ct Angio Head W Or Wo Contrast  Result Date: 04/10/2018 CLINICAL DATA:  Left basal ganglia and centrum semi ovale infarcts. Stroke follow-up. Weakness. EXAM: CT ANGIOGRAPHY HEAD AND NECK TECHNIQUE: Multidetector CT imaging of the head and neck was performed using the standard protocol during bolus administration of intravenous contrast. Multiplanar CT image reconstructions and MIPs were obtained to evaluate the vascular anatomy. Carotid stenosis measurements (when applicable) are obtained utilizing NASCET criteria, using the distal internal carotid diameter as the denominator. CONTRAST:  31m ISOVUE-370 IOPAMIDOL (ISOVUE-370) INJECTION 76% COMPARISON:  MRI brain 04/10/2018 FINDINGS: CT HEAD FINDINGS Brain: Bilateral basal ganglia infarcts are noted. Remote right corona radiata infarcts are present. The punctate left parietal white matter infarct is not appreciated by CT. Ventricles are of normal  size. No significant extra-axial fluid collection is present. There is no acute hemorrhage. Vascular: Atherosclerotic calcifications are present within the cavernous internal carotid arteries. There is no hyperdense vessel. Skull: Calvarium is intact. No focal lytic or blastic lesions are present. ORIF of the left orbital rim is noted. Sinuses: The paranasal sinuses and mastoid air cells are clear. Orbits: Globes and orbits are within normal limits. Review of the MIP images confirms the above findings CTA NECK FINDINGS Aortic arch: Patient is status post median sternotomy for CABG. Atherosclerotic changes are noted at the origins the great vessels without significant stenosis relative to the more distal vessels. Right carotid system: The right common carotid artery demonstrates some atherosclerotic change without focal stenosis. The right carotid bifurcation is patulous, suggesting previous endarterectomy more distal cervical right ICA is normal. Left carotid system: The left common carotid artery demonstrates distal atherosclerotic plaque. Additional calcified and noncalcified plaque is present at the left carotid bifurcation. Minimal luminal diameter is 3.5 mm. There is no significant  stenosis relative to the more distal vessel. The more distal left common carotid artery is within normal limits. Vertebral arteries: The right vertebral artery is the dominant vessel. Dense calcification is present at the origin of the non dominant left vertebral artery. There is no significant stenosis at the origin of the dominant right vertebral artery. No other significant stenoses are present in either vertebral artery in the neck. Calcifications are present along the wall of the right vertebral artery at the C1 level. Skeleton: Rightward curvature is present in the cervical spine. Asymmetric left-sided facet spurring is noted. Vertebral body heights and alignment are maintained. No focal lytic or blastic lesions are present.  Other neck: No focal mucosal or submucosal lesions are present. Thyroid is within normal limits. No significant cervical adenopathy is present. Salivary glands are within normal limits bilaterally. Upper chest: Peripheral interstitial lung changes are present bilaterally. No focal nodule, mass, or airspace disease is present. The thoracic inlet is within normal limits. Review of the MIP images confirms the above findings CTA HEAD FINDINGS Anterior circulation: Atherosclerotic changes are present within the cavernous internal carotid arteries bilaterally without a significant stenosis relative to the more distal vessel. The ICA termini are within normal limits bilaterally. The A1 and M1 segments are normal. The anterior communicating artery is patent. MCA bifurcations are within normal limits. ACA and MCA branch vessels demonstrate mild distal attenuation without a significant proximal stenosis or occlusion. No significant aneurysm is present. Posterior circulation: The right vertebral artery is the dominant vessel. PICA origins are visualized and normal. Basilar artery is small distally. Right posterior cerebral artery is of fetal type. The left posterior cerebral artery fills from the basilar tip with contribution from a left posterior communicating artery. The PCA branch vessels are within normal limits. Venous sinuses: The dural sinuses are patent. The straight sinus and deep cerebral veins are intact. Cortical veins are unremarkable. Anatomic variants: Fetal type right posterior cerebral artery. Prominent left posterior communicating artery. Delayed phase: The postcontrast images accentuate white matter disease and previous white matter infarcts. No pathologic enhancement is present. Review of the MIP images confirms the above findings IMPRESSION: 1. Long segment atherosclerotic narrowing of the distal left common carotid artery through the left carotid bifurcation without a significant stenosis relative to the  more distal vessel. Although there is no significant stenosis. The extensive luminal plaque could be a source for distal emboli. 2. Previous right carotid endarterectomy is patent. 3. Atherosclerotic changes at the aortic arch and bilateral cavernous internal carotid arteries without significant stenosis. 4. 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 5. Mild distal small vessel disease within the circle-of-Willis without significant proximal stenosis, aneurysm, or branch vessel occlusion. 6. Degenerative changes of the cervical spine with rightward curvature and asymmetric left-sided facet arthropathy. 7. Changes of interstitial lung disease noted. Electronically Signed   By: San Morelle M.D.   On: 04/10/2018 19:59   Ct Head Wo Contrast  Result Date: 04/04/2018 CLINICAL DATA:  Bilateral leg weakness and slurred speech beginning at 4 a.m. today. Previous stroke and myocardial infarction EXAM: CT HEAD WITHOUT CONTRAST TECHNIQUE: Contiguous axial images were obtained from the base of the skull through the vertex without intravenous contrast. COMPARISON:  Head CT report dated 02/03/2002. FINDINGS: Brain: Diffusely enlarged ventricles and subarachnoid spaces. Patchy white matter low density in both cerebral hemispheres. 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 No intracranial hemorrhage, mass lesion or CT evidence of acute infarction. Vascular: No  hyperdense vessel or unexpected calcification. Skull: There is some motion degradation at the skull base. No fractures are seen. Sinuses/Orbits: Unremarkable. Other: None. IMPRESSION: 1. No acute abnormality. 2. Mild diffuse cerebral and cerebellar atrophy. 3. 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 Electronically Signed   By: Claudie Revering M.D.   On: 04/04/2018 17:10   Ct Angio Neck W Or Wo Contrast  Result Date: 04/10/2018 CLINICAL DATA:  Left basal ganglia and centrum semi ovale infarcts.  Stroke follow-up. Weakness. EXAM: CT ANGIOGRAPHY HEAD AND NECK TECHNIQUE: Multidetector CT imaging of the head and neck was performed using the standard protocol during bolus administration of intravenous contrast. Multiplanar CT image reconstructions and MIPs were obtained to evaluate the vascular anatomy. Carotid stenosis measurements (when applicable) are obtained utilizing NASCET criteria, using the distal internal carotid diameter as the denominator. CONTRAST:  74m ISOVUE-370 IOPAMIDOL (ISOVUE-370) INJECTION 76% COMPARISON:  MRI brain 04/10/2018 FINDINGS: CT HEAD FINDINGS Brain: Bilateral basal ganglia infarcts are noted. Remote right corona radiata infarcts are present. The punctate left parietal white matter infarct is not appreciated by CT. Ventricles are of normal size. No significant extra-axial fluid collection is present. There is no acute hemorrhage. Vascular: Atherosclerotic calcifications are present within the cavernous internal carotid arteries. There is no hyperdense vessel. Skull: Calvarium is intact. No focal lytic or blastic lesions are present. ORIF of the left orbital rim is noted. Sinuses: The paranasal sinuses and mastoid air cells are clear. Orbits: Globes and orbits are within normal limits. Review of the MIP images confirms the above findings CTA NECK FINDINGS Aortic arch: Patient is status post median sternotomy for CABG. Atherosclerotic changes are noted at the origins the great vessels without significant stenosis relative to the more distal vessels. Right carotid system: The right common carotid artery demonstrates some atherosclerotic change without focal stenosis. The right carotid bifurcation is patulous, suggesting previous endarterectomy more distal cervical right ICA is normal. Left carotid system: The left common carotid artery demonstrates distal atherosclerotic plaque. Additional calcified and noncalcified plaque is present at the left carotid bifurcation. Minimal luminal  diameter is 3.5 mm. There is no significant stenosis relative to the more distal vessel. The more distal left common carotid artery is within normal limits. Vertebral arteries: The right vertebral artery is the dominant vessel. Dense calcification is present at the origin of the non dominant left vertebral artery. There is no significant stenosis at the origin of the dominant right vertebral artery. No other significant stenoses are present in either vertebral artery in the neck. Calcifications are present along the wall of the right vertebral artery at the C1 level. Skeleton: Rightward curvature is present in the cervical spine. Asymmetric left-sided facet spurring is noted. Vertebral body heights and alignment are maintained. No focal lytic or blastic lesions are present. Other neck: No focal mucosal or submucosal lesions are present. Thyroid is within normal limits. No significant cervical adenopathy is present. Salivary glands are within normal limits bilaterally. Upper chest: Peripheral interstitial lung changes are present bilaterally. No focal nodule, mass, or airspace disease is present. The thoracic inlet is within normal limits. Review of the MIP images confirms the above findings CTA HEAD FINDINGS Anterior circulation: Atherosclerotic changes are present within the cavernous internal carotid arteries bilaterally without a significant stenosis relative to the more distal vessel. The ICA termini are within normal limits bilaterally. The A1 and M1 segments are normal. The anterior communicating artery is patent. MCA bifurcations are within normal limits. ACA and MCA branch vessels  demonstrate mild distal attenuation without a significant proximal stenosis or occlusion. No significant aneurysm is present. Posterior circulation: The right vertebral artery is the dominant vessel. PICA origins are visualized and normal. Basilar artery is small distally. Right posterior cerebral artery is of fetal type. The left  posterior cerebral artery fills from the basilar tip with contribution from a left posterior communicating artery. The PCA branch vessels are within normal limits. Venous sinuses: The dural sinuses are patent. The straight sinus and deep cerebral veins are intact. Cortical veins are unremarkable. Anatomic variants: Fetal type right posterior cerebral artery. Prominent left posterior communicating artery. Delayed phase: The postcontrast images accentuate white matter disease and previous white matter infarcts. No pathologic enhancement is present. Review of the MIP images confirms the above findings IMPRESSION: 1. Long segment atherosclerotic narrowing of the distal left common carotid artery through the left carotid bifurcation without a significant stenosis relative to the more distal vessel. Although there is no significant stenosis. The extensive luminal plaque could be a source for distal emboli. 2. Previous right carotid endarterectomy is patent. 3. Atherosclerotic changes at the aortic arch and bilateral cavernous internal carotid arteries without significant stenosis. 4. 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 5. Mild distal small vessel disease within the circle-of-Willis without significant proximal stenosis, aneurysm, or branch vessel occlusion. 6. Degenerative changes of the cervical spine with rightward curvature and asymmetric left-sided facet arthropathy. 7. Changes of interstitial lung disease noted. Electronically Signed   By: San Morelle M.D.   On: 04/10/2018 19:59   Mr Brain Wo Contrast  Result Date: 04/10/2018 CLINICAL DATA:  Neuro deficits, subacute.  Weakness. EXAM: MRI HEAD WITHOUT CONTRAST TECHNIQUE: Multiplanar, multiecho pulse sequences of the brain and surrounding structures were obtained without intravenous contrast. COMPARISON:  CT head without contrast 04/04/2018. FINDINGS: Brain: 2 punctate foci of restricted diffusion are compatible with  acute nonhemorrhagic infarcts. These involve the medial left lentiform nucleus and posterior left centrum semi ovale. No acute hemorrhage or mass lesion is present. Remote infarcts of the right right radiata are noted. The ventricles are proportionate to the degree of atrophy. No significant extra-axial fluid collection is present. Vascular: Flow is present in the major intracranial arteries. Skull and upper cervical spine: The skull base is within normal limits. The craniocervical junction is normal. Sinuses/Orbits: The paranasal sinuses are clear. There is some fluid in the inferior mastoid air cells bilaterally. No obstructing nasopharyngeal lesion is present. Globes and orbits are within normal limits. IMPRESSION: 1. Punctate nonhemorrhagic infarct involving the medial left basal ganglia. 2. Punctate nonhemorrhagic white matter infarct in the posterior left centrum semi ovale. 3. Remote white matter infarcts in the right corona radiata. This could be a watershed insult. 4. Moderate atrophy and white matter disease otherwise compatible with chronic microvascular ischemia. Electronically Signed   By: San Morelle M.D.   On: 04/10/2018 15:50   Dg Chest Port 1 View  Result Date: 04/06/2018 CLINICAL DATA:  Sepsis EXAM: PORTABLE CHEST 1 VIEW COMPARISON:  Chest CT August 17, 2017; chest radiograph September 08, 2017; chest radiograph April 04, 2018 FINDINGS: There is widespread parenchymal fibrosis throughout the lungs. There is no frank edema or consolidation. There is cardiomegaly with pulmonary vascularity normal. No adenopathy. Patient is status post coronary artery bypass grafting. Multiple sternal wires are fractured, stable. There old healed rib fractures on the left. IMPRESSION: Fibrosis throughout the lungs bilaterally without frank edema or consolidation. Stable cardiomegaly. Multiple sternal wires fractured. Old rib fractures  on the left are stable in appearance. No pneumothorax. Electronically  Signed   By: Lowella Grip III M.D.   On: 04/06/2018 07:42   Dg Chest Port 1 View  Result Date: 04/04/2018 CLINICAL DATA:  Bilateral leg weakness, slurred speech EXAM: PORTABLE CHEST 1 VIEW COMPARISON:  11/01/2017 FINDINGS: Subpleural reticulation/fibrosis in the lungs bilaterally, suggesting chronic interstitial lung disease. No focal consolidation. There effusion Cardiomegaly.  Postsurgical changes related to prior CABG. Median sternotomy with stable fractured upper sternal wires. Old left posterior rib fractures. IMPRESSION: No evidence of acute cardiopulmonary disease. Stable chronic interstitial lung disease. Electronically Signed   By: Julian Hy M.D.   On: 04/04/2018 17:29    Microbiology: Recent Results (from the past 240 hour(s))  Blood Culture (routine x 2)     Status: None   Collection Time: 04/04/18  4:25 PM  Result Value Ref Range Status   Specimen Description BLOOD RIGHT ANTECUBITAL  Final   Special Requests   Final    BOTTLES DRAWN AEROBIC AND ANAEROBIC Blood Culture adequate volume   Culture   Final    NO GROWTH 5 DAYS Performed at [facility], [address] [place], [place] [address]    Report Status 04/09/2018 FINAL  Final  Blood Culture (routine x 2)     Status: None   Collection Time: 04/04/18  5:20 PM  Result Value Ref Range Status   Specimen Description BLOOD LEFT HAND  Final   Special Requests   Final    BOTTLES DRAWN AEROBIC ONLY Blood Culture results may not be optimal due to an inadequate volume of blood received in culture bottles   Culture   Final    NO GROWTH 5 DAYS Performed at [facility], [place] [address] [place], [place] [address]    Report Status 04/09/2018 FINAL  Final  Urine culture     Status: None   Collection Time: 04/04/18  7:45 PM  Result Value Ref Range Status   Specimen Description URINE, RANDOM  Final   Special Requests NONE  Final   Culture   Final    NO GROWTH Performed at [facility], [place] [address], [place], [place] [address]    Report Status 04/06/2018 FINAL  Final  Culture, blood (routine x 2)     Status: None   Collection Time: 04/05/18 12:54 AM  Result Value Ref Range Status   Specimen Description BLOOD RIGHT HAND  Final   Special Requests   Final    BOTTLES DRAWN AEROBIC ONLY Blood Culture adequate volume   Culture   Final    NO GROWTH 5 DAYS Performed at [facility], [place] [address] [place], [place] [address]    Report Status 04/10/2018 FINAL  Final  Culture, blood (routine x 2)     Status: None   Collection Time: 04/05/18  1:11 AM  Result Value Ref Range Status   Specimen Description BLOOD LEFT A-LINE  Final   Special Requests   Final    BOTTLES DRAWN AEROBIC ONLY Blood Culture adequate volume   Culture   Final    NO GROWTH 5 DAYS Performed at [facility], [place] [address] [place], [place] [address]    Report Status 04/10/2018 FINAL  Final  MRSA PCR Screening     Status: None   Collection Time: 04/05/18  4:04 AM  Result Value Ref Range Status   MRSA by PCR NEGATIVE NEGATIVE Final  Comment:        The GeneXpert MRSA Assay (FDA approved for NASAL specimens only), is one component of a comprehensive MRSA colonization surveillance program. It is not intended to diagnose MRSA infection nor to guide or monitor treatment for MRSA infections. Performed at [facility], [place] [address] [place], [place] [address]   C difficile quick scan w PCR reflex     Status: None   Collection Time: 04/07/18  1:16 PM  Result Value Ref Range Status   C Diff antigen NEGATIVE NEGATIVE Final   C Diff toxin NEGATIVE NEGATIVE Final   C Diff interpretation No C. difficile detected.  Final    Comment: Performed at [facility], [place] [address] [place], [place] [address]  Gastrointestinal Panel by PCR , Stool     Status: None   Collection Time: 04/07/18  1:16 PM  Result Value Ref Range Status   Campylobacter species NOT DETECTED NOT DETECTED Final   Plesimonas  shigelloides NOT DETECTED NOT DETECTED Final   Salmonella species NOT DETECTED NOT DETECTED Final   Yersinia enterocolitica NOT DETECTED NOT DETECTED Final   Vibrio species NOT DETECTED NOT DETECTED Final   Vibrio cholerae NOT DETECTED NOT DETECTED Final   Enteroaggregative E coli (EAEC) NOT DETECTED NOT DETECTED Final   Enteropathogenic E coli (EPEC) NOT DETECTED NOT DETECTED Final   Enterotoxigenic E coli (ETEC) NOT DETECTED NOT DETECTED Final   Shiga like toxin producing E coli (STEC) NOT DETECTED NOT DETECTED Final   Shigella/Enteroinvasive E coli (EIEC) NOT DETECTED NOT DETECTED Final   Cryptosporidium NOT DETECTED NOT DETECTED Final   Cyclospora cayetanensis NOT DETECTED NOT DETECTED Final   Entamoeba histolytica NOT DETECTED NOT DETECTED Final   Giardia lamblia NOT DETECTED NOT DETECTED Final   Adenovirus F40/41 NOT DETECTED NOT DETECTED Final   Astrovirus NOT DETECTED NOT DETECTED Final   Norovirus GI/GII NOT DETECTED NOT DETECTED Final   Rotavirus A NOT DETECTED NOT DETECTED Final   Sapovirus (I, II, IV, and V) NOT DETECTED NOT DETECTED Final    Comment: Performed at [facility], [address] [place], [place] [address]     Labs: Basic Metabolic Panel: Recent Labs  Lab 04/06/18 0445  04/08/18 0709 04/09/18 0631 04/10/18 0724 04/11/18 0444 04/12/18 0531 04/12/18 1458  NA 142   < > 145 142 142 138 138  --   K 4.3   < > 3.8 3.7 3.8 4.3 5.2* 5.2*  CL 108   < > 111 105 103 103 100*  --   CO2 25   < > 23 30 31 29 29   --   GLUCOSE 119*   < > 89 97 106* 82 102*  --   BUN 35*   < > 19 14 12 10 11   --   CREATININE 1.62*   < > 0.80 0.73 0.71 0.77 0.80  --   CALCIUM 8.2*   < > 8.5* 8.4* 8.3* 8.6* 9.1  --   MG 2.2   < > 1.8 1.8 1.7 1.7 1.9  --   PHOS 2.4*  --   --   --   --   --   --   --    < > = values in this interval not displayed.   Liver Function Tests: Recent Labs  Lab 04/07/18 0317 04/09/18 0631 04/10/18 0724  AST 67* 33 24  ALT 45 34 31  ALKPHOS  64 58 56  BILITOT 0.6 0.8  0.7  PROT 6.1* 5.5* 5.3*  ALBUMIN 2.3* 2.1* 2.1*   No results for input(s): LIPASE, AMYLASE in the last 168 hours. No results for input(s): AMMONIA in the last 168 hours. CBC: Recent Labs  Lab 04/08/18 0709 04/09/18 0631 04/10/18 0724 04/11/18 0444 04/12/18 0531  WBC 8.5 9.1 9.3 9.3 9.3  HGB 10.4* 10.1* 10.4* 11.3* 12.1*  HCT 33.3* 32.4* 33.3* 35.9* 38.6*  MCV 93.3 92.6 91.0 90.2 90.2  PLT 184 190 199 223 266   Cardiac Enzymes: Recent Labs  Lab 04/07/18 0317  TROPONINI 0.48*   BNP: BNP (last 3 results) Recent Labs    11/01/17 1154 04/04/18 1505 04/05/18 0111  BNP 149.9* 134.7* 165.9*    ProBNP (last 3 results) No results for input(s): PROBNP in the last 8760 hours.  CBG: No results for input(s): GLUCAP in the last 168 hours.     Signed:  Fayrene Helper MD.  [facility] 04/12/2018, 4:40 PM

## 2018-04-12 NOTE — Care Management Note (Signed)
Case Management Note  Patient Details  Name: [name] MRN: [id number] Date of Birth: 06-Mar-1956  Subjective/Objective:                    Action/Plan: Plan is for SNF when medically ready. CM following.   Expected Discharge Date:                  Expected Discharge Plan:  Skilled Nursing Facility  In-House Referral:  Clinical Social Work  Discharge planning Services     Post Acute Care Choice:    Choice offered to:     DME Arranged:    DME Agency:     HH Arranged:    HH Agency:     Status of Service:  In process, will continue to follow  If discussed at Long Length of Stay Meetings, dates discussed:    Additional Comments:  Kermit Balo, RN 04/12/2018, 3:22 PM

## 2018-04-12 NOTE — Progress Notes (Signed)
Report given to Scarlette Calico, LPN at [facility] to assume care of patient.

## 2018-04-12 NOTE — Plan of Care (Signed)
Patient stable, discussed POC with patient, agreeable w/plan, denies question/concerns at this time.  

## 2018-04-12 NOTE — Consult Note (Signed)
Physical Medicine and Rehabilitation Consult   Reason for Consult: Functional deficits Referring Physician: Dr. Lowell Guitar   HPI: [name] is a 62 y.o. male with history of CAD, RA, interstitial lung disease, COPD- oxygen dependent at nights/ongoing tobacco, AAA, L-CAS, CVA with residual left sided weakness, chronic pain and increase in narcotics a week PTA and was admitted on 04/04/18 with reports of progressive weakness with inability to walk X 2 weeks progressing to inability to move BLE. History taken from chart review and patient. He was found to have septic shock with hypotension, acute renal failure with urinary retention and metabolic acidosis. He was treated with fluid boluses with norepinephrine and stress dose steriods. Exam revealed generalized weakness with question of worsening of right facial droop and large sacral deep tissue injury with bullous changes. Shock felt to be due to hypovolemia and adrenal insufficiency as infectious work up negative.  2D echo showed EF 30-35% with severe hypokinesis to akinesis on inferior lateral, inferior and inferoseptal wall with grade 2 DD.    Delirium resolving and on 06/09, he was found to have RLE weakness and MRI brain reviewed showing left basal ganglia infarction. Per report, punctate nonhemorrhagic infarct in left basal ganglia and posterior left centrum ovale. Dr. Pearlean Brownie felt that stroke due ot small vessel disease in setting of hypotension and DAPT of ASA/Plavix for 3 weeks followed by plavix alone. Therapy ongoing and patient with significant deficits in mobility and self care tasks. CIR recommended for follow up therapy.   Review of Systems  Constitutional: Negative for chills and fever.  HENT: Negative for hearing loss and tinnitus.   Eyes: Negative for blurred vision and double vision.  Respiratory: Negative for cough, hemoptysis and shortness of breath.   Cardiovascular: Negative for chest pain and palpitations.    Gastrointestinal: Negative for heartburn and nausea.  Genitourinary: Negative for dysuria.  Musculoskeletal: Positive for joint pain (diffuse joint pain due to RA) and myalgias.  Skin: Negative for itching and rash.  Neurological: Positive for focal weakness (now with RLE weakness). Negative for dizziness and headaches.  Psychiatric/Behavioral: The patient is not nervous/anxious and does not have insomnia.   All other systems reviewed and are negative.     Past Medical History:  Diagnosis Date  . AAA (abdominal aortic aneurysm) (HCC)   . Arthritis   . CAD in native artery    a. h/o MI x 3 s/p CABG x 4 (1993 with Tyrone Sage). b. LHC 01/2017 with severe native disease, 2 grafts occluded.  . Carotid artery disease (HCC)    a. s/p R CEA 11/2016.  [name] Chronic combined systolic and diastolic CHF (congestive heart failure) (HCC)   . GERD (gastroesophageal reflux disease)   . ILD (interstitial lung disease) (HCC)   . Lung nodule   . MI (myocardial infarction) (HCC)    x 3   . Normocytic anemia   . NSVT (nonsustained ventricular tachycardia) (HCC)   . Pancreatitis    1/19  . Pneumonia 11/2017  . RA (rheumatoid arthritis) (HCC)   . Stroke (HCC)   . Tobacco abuse     Past Surgical History:  Procedure Laterality Date  . CARDIAC CATHETERIZATION    . CAROTID ENDARTERECTOMY Left   . Heart Bypass  10/1992  . LEFT HEART CATH AND CORS/GRAFTS ANGIOGRAPHY N/A 03/01/2017   Procedure: Left Heart Cath and Cors/Grafts Angiography;  Surgeon: Lyn Records, MD;  Location: [facility] INVASIVE CV LAB;  Service: Cardiovascular;  Laterality: N/A;  .  Throat Biopsy     Cat scratch fever    Family History  Problem Relation Age of Onset  . Heart murmur Mother   . Heart disease Mother   . Heart attack Mother   . Suicidality Father   . Heart attack Father   . Stroke Brother   . Heart attack Brother   . Heart attack Maternal Grandmother   . Cancer Maternal Grandmother        unknown type  . Lung cancer  Maternal Aunt   . Lung cancer Maternal Uncle   . Cancer Maternal Aunt        unknown cancer  . Heart attack Brother     Social History:   Lives with sister and was independent for mobility with AD and was able to perform ADLs till 2 weeks ago. Sister at home  disabled but can provide supervision. He reports that he has been smoking cigarettes--1/2 PPD  He has a 23.50 pack-year smoking history. He has never used smokeless tobacco. He reports that he does not drink alcohol or use drugs.    Allergies: No Known Allergies    Medications Prior to Admission  Medication Sig Dispense Refill  . acetaminophen (TYLENOL) 500 MG tablet Take 1,000 mg by mouth every 6 (six) hours as needed for mild pain.     [name] albuterol (ACCUNEB) 0.63 MG/3ML nebulizer solution Take 3 mLs by nebulization every 6 (six) hours as needed for wheezing.  12  . [EXPIRED] amoxicillin (AMOXIL) 500 MG capsule Take 500 mg by mouth 4 (four) times daily. For 5 days  0  . aspirin EC 81 MG tablet Take 81 mg by mouth daily.    [name] atorvastatin (LIPITOR) 40 MG tablet Take 1 tablet (40 mg total) by mouth daily at 6 PM. 30 tablet 11  . carvedilol (COREG) 25 MG tablet Take 1 tablet (25 mg total) by mouth 2 (two) times daily with a meal. (Patient taking differently: Take 12.5 mg by mouth 2 (two) times daily with a meal. ) 60 tablet 11  . furosemide (LASIX) 20 MG tablet Take 1 tablet (20 mg total) by mouth daily as needed for fluid or edema. 90 tablet 3  . gabapentin (NEURONTIN) 300 MG capsule Take 1 capsule (300 mg total) by mouth every 8 (eight) hours. 90 capsule 4  . HYDROcodone-acetaminophen (NORCO) 10-325 MG tablet Take 1 tablet by mouth 3 (three) times daily as needed for severe pain. 90 tablet 0  . hydroxychloroquine (PLAQUENIL) 200 MG tablet Take 200 mg by mouth 2 (two) times daily.  3  . losartan (COZAAR) 25 MG tablet Take 25 mg by mouth daily.  11  . Menthol-Methyl Salicylate (MUSCLE RUB EX) Apply 1 application topically as needed (for  shoulder or knee pain).     . OXYGEN Inhale 2 L into the lungs at bedtime.    . predniSONE (DELTASONE) 10 MG tablet Take 10 mg daily    . PROVENTIL HFA 108 (90 Base) MCG/ACT inhaler INHALE 2 PUFFS EVERY 6 HOURS AS NEEDED FOR WHEEZE/SHORTNESS OF BREATH 6.7 g 6  . sacubitril-valsartan (ENTRESTO) 49-51 MG Take 1 tablet by mouth 2 (two) times daily. 60 tablet 11  . spironolactone (ALDACTONE) 25 MG tablet Take 1 tablet (25 mg total) by mouth daily. 30 tablet 11  . aspirin 81 MG chewable tablet Chew 1 tablet (81 mg total) by mouth daily. (Patient not taking: Reported on 04/04/2018) 30 tablet 0    Home: Home Living Family/patient expects to be  discharged to:: Private residence Living Arrangements: Other relatives(sister ) Available Help at Discharge: Family, Available 24 hours/day Type of Home: Mobile home Home Access: Stairs to enter Entrance Stairs-Number of Steps: 6 Entrance Stairs-Rails: Left Home Layout: One level Bathroom Shower/Tub: Health visitor: Standard Bathroom Accessibility: Yes Home Equipment: Environmental consultant - 2 wheels, Wheelchair - manual, [employer] - single point, Environmental consultant - 4 wheels, Shower seat  Functional History: Prior Function Level of Independence: Independent Comments: Pt reports sister performs IADLs.  He does not drive  Functional Status:  Mobility: Bed Mobility Overal bed mobility: Needs Assistance Bed Mobility: Supine to Sit Supine to sit: Min assist Sit to supine: Mod assist General bed mobility comments: assist to scoot hips toward EOB and to elevate trunk into sitting; use of rail and HOB  Transfers Overall transfer level: Needs assistance Equipment used: Rolling walker (2 wheeled) Transfer via Lift Equipment: Stedy Transfers: Sit to/from Stand, Pharmacologist Sit to Stand: +2 safety/equipment, Min assist, +2 physical assistance, Max assist Stand pivot transfers: Mod assist, +2 safety/equipment Squat pivot transfers: +2 physical assistance, Max  assist General transfer comment: assistance required to power up into standing X2 from EOB and X1 from recliner and less assist needed once in standing; pt able to perform pre gait activity with min A and demonstrated difficulty with single leg stance on L LE; mod A +2 for pivot EOB to recliner with assistance required for balance, weight shifting, and turning RW; poor eccentric loading       ADL: ADL Overall ADL's : Needs assistance/impaired Eating/Feeding: Set up Grooming: Wash/dry face, Set up, Sitting Grooming Details (indicate cue type and reason): EOB - improved seated balance Upper Body Bathing: Minimal assistance, Sitting Lower Body Bathing: Maximal assistance, Sit to/from stand Upper Body Dressing : Moderate assistance, Sitting Lower Body Dressing: Total assistance, Sit to/from stand Toilet Transfer: Maximal assistance, +2 for safety/equipment, Stand-pivot, BSC Toilet Transfer Details (indicate cue type and reason): max A +2 to stand initially, min A +2 once in standing, mod A +2 for pivot - assist to manage the walker and vc for sequencing Toileting- Clothing Manipulation and Hygiene: Total assistance, Sit to/from stand Functional mobility during ADLs: Maximal assistance, +2 for safety/equipment General ADL Comments: improved activity tolerance this session  Cognition: Cognition Overall Cognitive Status: Within Functional Limits for tasks assessed Arousal/Alertness: Awake/alert Orientation Level: Oriented X4 Attention: Focused Focused Attention: Appears intact Memory: Appears intact Awareness: Appears intact Problem Solving: Appears intact Executive Function: Organizing Organizing: Appears intact Safety/Judgment: Appears intact Cognition Arousal/Alertness: Awake/alert Behavior During Therapy: Flat affect Overall Cognitive Status: Within Functional Limits for tasks assessed General Comments: WFL for basic info   Blood pressure 127/85, pulse 93, temperature 97.7 F  (36.5 C), temperature source Oral, resp. rate 20, height 6\' 1"  (1.854 m), weight 86.6 kg (191 lb), SpO2 96 %. Physical Exam  Nursing note and vitals reviewed. Constitutional: He appears well-developed and well-nourished.  HENT:  Head: Normocephalic and atraumatic.  Eyes: EOM are normal. Right eye exhibits no discharge. Left eye exhibits no discharge.  Neck: Normal range of motion. Neck supple.  Cardiovascular: Normal rate and regular rhythm.  Respiratory: Effort normal and breath sounds normal.  GI: Soft. Bowel sounds are normal.  Musculoskeletal:  No edema or tenderness in extremities  Neurological: He is alert.  Left facial weakness with left ptosis and minimal dysarthria.  Able to answer orientation questions without difficulty. Able to follow simple one and two step motor commands. Motor: right upper extremity: 4/5 proximal  to distal Left Upper extremity: 4+/5 proximal to distal Right lower extremity: physical exam, knee extension 2/5, ankle dorsiflexion 3/5 Left lower extremity: Hip flexion, knee extension 1/5, ankle dorsiflexion 2-/5 Ataxia with left finger to nose.  RLE ataxia with weakness.   Skin:  Right gluteal ulcer  Psychiatric: He has a normal mood and affect. His behavior is normal.    Results for orders placed or performed during the hospital encounter of 04/04/18 (from the past 24 hour(s))  Basic metabolic panel     Status: Abnormal   Collection Time: 04/12/18  5:31 AM  Result Value Ref Range   Sodium 138 135 - 145 mmol/L   Potassium 5.2 (H) 3.5 - 5.1 mmol/L   Chloride 100 (L) 101 - 111 mmol/L   CO2 29 22 - 32 mmol/L   Glucose, Bld 102 (H) 65 - 99 mg/dL   BUN 11 6 - 20 mg/dL   Creatinine, Ser 8.20 0.61 - 1.24 mg/dL   Calcium 9.1 8.9 - 60.1 mg/dL   GFR calc non Af Amer >60 >60 mL/min   GFR calc Af Amer >60 >60 mL/min   Anion gap 9 5 - 15  Magnesium     Status: None   Collection Time: 04/12/18  5:31 AM  Result Value Ref Range   Magnesium 1.9 1.7 - 2.4  mg/dL  CBC     Status: Abnormal   Collection Time: 04/12/18  5:31 AM  Result Value Ref Range   WBC 9.3 4.0 - 10.5 K/uL   RBC 4.28 4.22 - 5.81 MIL/uL   Hemoglobin 12.1 (L) 13.0 - 17.0 g/dL   HCT 56.1 (L) 53.7 - 94.3 %   MCV 90.2 78.0 - 100.0 fL   MCH 28.3 26.0 - 34.0 pg   MCHC 31.3 30.0 - 36.0 g/dL   RDW 27.6 14.7 - 09.2 %   Platelets 266 150 - 400 K/uL   Ct Angio Head W Or Wo Contrast  Result Date: 04/10/2018 CLINICAL DATA:  Left basal ganglia and centrum semi ovale infarcts. Stroke follow-up. Weakness. EXAM: CT ANGIOGRAPHY HEAD AND NECK TECHNIQUE: Multidetector CT imaging of the head and neck was performed using the standard protocol during bolus administration of intravenous contrast. Multiplanar CT image reconstructions and MIPs were obtained to evaluate the vascular anatomy. Carotid stenosis measurements (when applicable) are obtained utilizing NASCET criteria, using the distal internal carotid diameter as the denominator. CONTRAST:  35mL ISOVUE-370 IOPAMIDOL (ISOVUE-370) INJECTION 76% COMPARISON:  MRI brain 04/10/2018 FINDINGS: CT HEAD FINDINGS Brain: Bilateral basal ganglia infarcts are noted. Remote right corona radiata infarcts are present. The punctate left parietal white matter infarct is not appreciated by CT. Ventricles are of normal size. No significant extra-axial fluid collection is present. There is no acute hemorrhage. Vascular: Atherosclerotic calcifications are present within the cavernous internal carotid arteries. There is no hyperdense vessel. Skull: Calvarium is intact. No focal lytic or blastic lesions are present. ORIF of the left orbital rim is noted. Sinuses: The paranasal sinuses and mastoid air cells are clear. Orbits: Globes and orbits are within normal limits. Review of the MIP images confirms the above findings CTA NECK FINDINGS Aortic arch: Patient is status post median sternotomy for CABG. Atherosclerotic changes are noted at the origins the great vessels without  significant stenosis relative to the more distal vessels. Right carotid system: The right common carotid artery demonstrates some atherosclerotic change without focal stenosis. The right carotid bifurcation is patulous, suggesting previous endarterectomy more distal cervical right ICA is normal. Left  carotid system: The left common carotid artery demonstrates distal atherosclerotic plaque. Additional calcified and noncalcified plaque is present at the left carotid bifurcation. Minimal luminal diameter is 3.5 mm. There is no significant stenosis relative to the more distal vessel. The more distal left common carotid artery is within normal limits. Vertebral arteries: The right vertebral artery is the dominant vessel. Dense calcification is present at the origin of the non dominant left vertebral artery. There is no significant stenosis at the origin of the dominant right vertebral artery. No other significant stenoses are present in either vertebral artery in the neck. Calcifications are present along the wall of the right vertebral artery at the C1 level. Skeleton: Rightward curvature is present in the cervical spine. Asymmetric left-sided facet spurring is noted. Vertebral body heights and alignment are maintained. No focal lytic or blastic lesions are present. Other neck: No focal mucosal or submucosal lesions are present. Thyroid is within normal limits. No significant cervical adenopathy is present. Salivary glands are within normal limits bilaterally. Upper chest: Peripheral interstitial lung changes are present bilaterally. No focal nodule, mass, or airspace disease is present. The thoracic inlet is within normal limits. Review of the MIP images confirms the above findings CTA HEAD FINDINGS Anterior circulation: Atherosclerotic changes are present within the cavernous internal carotid arteries bilaterally without a significant stenosis relative to the more distal vessel. The ICA termini are within normal  limits bilaterally. The A1 and M1 segments are normal. The anterior communicating artery is patent. MCA bifurcations are within normal limits. ACA and MCA branch vessels demonstrate mild distal attenuation without a significant proximal stenosis or occlusion. No significant aneurysm is present. Posterior circulation: The right vertebral artery is the dominant vessel. PICA origins are visualized and normal. Basilar artery is small distally. Right posterior cerebral artery is of fetal type. The left posterior cerebral artery fills from the basilar tip with contribution from a left posterior communicating artery. The PCA branch vessels are within normal limits. Venous sinuses: The dural sinuses are patent. The straight sinus and deep cerebral veins are intact. Cortical veins are unremarkable. Anatomic variants: Fetal type right posterior cerebral artery. Prominent left posterior communicating artery. Delayed phase: The postcontrast images accentuate white matter disease and previous white matter infarcts. No pathologic enhancement is present. Review of the MIP images confirms the above findings IMPRESSION: 1. Long segment atherosclerotic narrowing of the distal left common carotid artery through the left carotid bifurcation without a significant stenosis relative to the more distal vessel. Although there is no significant stenosis. The extensive luminal plaque could be a source for distal emboli. 2. Previous right carotid endarterectomy is patent. 3. Atherosclerotic changes at the aortic arch and bilateral cavernous internal carotid arteries without significant stenosis. 4. 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 5. Mild distal small vessel disease within the circle-of-Willis without significant proximal stenosis, aneurysm, or branch vessel occlusion. 6. Degenerative changes of the cervical spine with rightward curvature and asymmetric left-sided facet arthropathy. 7. Changes of  interstitial lung disease noted. Electronically Signed   By: Marin Roberts M.D.   On: 04/10/2018 19:59   Ct Angio Neck W Or Wo Contrast  Result Date: 04/10/2018 CLINICAL DATA:  Left basal ganglia and centrum semi ovale infarcts. Stroke follow-up. Weakness. EXAM: CT ANGIOGRAPHY HEAD AND NECK TECHNIQUE: Multidetector CT imaging of the head and neck was performed using the standard protocol during bolus administration of intravenous contrast. Multiplanar CT image reconstructions and MIPs were obtained to evaluate the vascular  anatomy. Carotid stenosis measurements (when applicable) are obtained utilizing NASCET criteria, using the distal internal carotid diameter as the denominator. CONTRAST:  84mL ISOVUE-370 IOPAMIDOL (ISOVUE-370) INJECTION 76% COMPARISON:  MRI brain 04/10/2018 FINDINGS: CT HEAD FINDINGS Brain: Bilateral basal ganglia infarcts are noted. Remote right corona radiata infarcts are present. The punctate left parietal white matter infarct is not appreciated by CT. Ventricles are of normal size. No significant extra-axial fluid collection is present. There is no acute hemorrhage. Vascular: Atherosclerotic calcifications are present within the cavernous internal carotid arteries. There is no hyperdense vessel. Skull: Calvarium is intact. No focal lytic or blastic lesions are present. ORIF of the left orbital rim is noted. Sinuses: The paranasal sinuses and mastoid air cells are clear. Orbits: Globes and orbits are within normal limits. Review of the MIP images confirms the above findings CTA NECK FINDINGS Aortic arch: Patient is status post median sternotomy for CABG. Atherosclerotic changes are noted at the origins the great vessels without significant stenosis relative to the more distal vessels. Right carotid system: The right common carotid artery demonstrates some atherosclerotic change without focal stenosis. The right carotid bifurcation is patulous, suggesting previous endarterectomy more  distal cervical right ICA is normal. Left carotid system: The left common carotid artery demonstrates distal atherosclerotic plaque. Additional calcified and noncalcified plaque is present at the left carotid bifurcation. Minimal luminal diameter is 3.5 mm. There is no significant stenosis relative to the more distal vessel. The more distal left common carotid artery is within normal limits. Vertebral arteries: The right vertebral artery is the dominant vessel. Dense calcification is present at the origin of the non dominant left vertebral artery. There is no significant stenosis at the origin of the dominant right vertebral artery. No other significant stenoses are present in either vertebral artery in the neck. Calcifications are present along the wall of the right vertebral artery at the C1 level. Skeleton: Rightward curvature is present in the cervical spine. Asymmetric left-sided facet spurring is noted. Vertebral body heights and alignment are maintained. No focal lytic or blastic lesions are present. Other neck: No focal mucosal or submucosal lesions are present. Thyroid is within normal limits. No significant cervical adenopathy is present. Salivary glands are within normal limits bilaterally. Upper chest: Peripheral interstitial lung changes are present bilaterally. No focal nodule, mass, or airspace disease is present. The thoracic inlet is within normal limits. Review of the MIP images confirms the above findings CTA HEAD FINDINGS Anterior circulation: Atherosclerotic changes are present within the cavernous internal carotid arteries bilaterally without a significant stenosis relative to the more distal vessel. The ICA termini are within normal limits bilaterally. The A1 and M1 segments are normal. The anterior communicating artery is patent. MCA bifurcations are within normal limits. ACA and MCA branch vessels demonstrate mild distal attenuation without a significant proximal stenosis or occlusion. No  significant aneurysm is present. Posterior circulation: The right vertebral artery is the dominant vessel. PICA origins are visualized and normal. Basilar artery is small distally. Right posterior cerebral artery is of fetal type. The left posterior cerebral artery fills from the basilar tip with contribution from a left posterior communicating artery. The PCA branch vessels are within normal limits. Venous sinuses: The dural sinuses are patent. The straight sinus and deep cerebral veins are intact. Cortical veins are unremarkable. Anatomic variants: Fetal type right posterior cerebral artery. Prominent left posterior communicating artery. Delayed phase: The postcontrast images accentuate white matter disease and previous white matter infarcts. No pathologic enhancement is present. Review  of the MIP images confirms the above findings IMPRESSION: 1. Long segment atherosclerotic narrowing of the distal left common carotid artery through the left carotid bifurcation without a significant stenosis relative to the more distal vessel. Although there is no significant stenosis. The extensive luminal plaque could be a source for distal emboli. 2. Previous right carotid endarterectomy is patent. 3. Atherosclerotic changes at the aortic arch and bilateral cavernous internal carotid arteries without significant stenosis. 4. 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 5. Mild distal small vessel disease within the circle-of-Willis without significant proximal stenosis, aneurysm, or branch vessel occlusion. 6. Degenerative changes of the cervical spine with rightward curvature and asymmetric left-sided facet arthropathy. 7. Changes of interstitial lung disease noted. Electronically Signed   By: Marin Roberts M.D.   On: 04/10/2018 19:59   Mr Brain Wo Contrast  Result Date: 04/10/2018 CLINICAL DATA:  Neuro deficits, subacute.  Weakness. EXAM: MRI HEAD WITHOUT CONTRAST TECHNIQUE: Multiplanar,  multiecho pulse sequences of the brain and surrounding structures were obtained without intravenous contrast. COMPARISON:  CT head without contrast 04/04/2018. FINDINGS: Brain: 2 punctate foci of restricted diffusion are compatible with acute nonhemorrhagic infarcts. These involve the medial left lentiform nucleus and posterior left centrum semi ovale. No acute hemorrhage or mass lesion is present. Remote infarcts of the right right radiata are noted. The ventricles are proportionate to the degree of atrophy. No significant extra-axial fluid collection is present. Vascular: Flow is present in the major intracranial arteries. Skull and upper cervical spine: The skull base is within normal limits. The craniocervical junction is normal. Sinuses/Orbits: The paranasal sinuses are clear. There is some fluid in the inferior mastoid air cells bilaterally. No obstructing nasopharyngeal lesion is present. Globes and orbits are within normal limits. IMPRESSION: 1. Punctate nonhemorrhagic infarct involving the medial left basal ganglia. 2. Punctate nonhemorrhagic white matter infarct in the posterior left centrum semi ovale. 3. Remote white matter infarcts in the right corona radiata. This could be a watershed insult. 4. Moderate atrophy and white matter disease otherwise compatible with chronic microvascular ischemia. Electronically Signed   By: Marin Roberts M.D.   On: 04/10/2018 15:50    Assessment/Plan: Diagnosis: Punctate nonhemorrhagic infarct in left basal ganglia and posterior left centrum ovale Labs and images independently reviewed.  Records reviewed and summated above. Stroke: Continue secondary stroke prophylaxis and Risk Factor Modification listed below:   Antiplatelet therapy:   Blood Pressure Management:  Continue current medication with prn's with permisive HTN per primary team Statin Agent:   Tobacco abuse:   Right sided hemiparesis: fit for orthosis to prevent contractures (PRAFO,  etc) Motor recovery: Fluoxetine  1. Does the need for close, 24 hr/day medical supervision in concert with the patient's rehab needs make it unreasonable for this patient to be served in a less intensive setting? Yes  2. Co-Morbidities requiring supervision/potential complications: chronic combined CHF (Monitor in accordance with increased physical activity and avoid UE resistance excercises), septic shock with hypotension, acute kidney injury (avoid nephrotoxic meds), urinary retention (I/O caths as necessary), CAD (continue meds) , RA (continue meds), interstitial lung disease, COPD ( monitor respiratory rate and O2 sats with increased exertion, continue supplemental oxygen dependent at nights), tobacco abuse (counsel), AAA, L-CAS, CVA with residual left sided weakness, chronic pain (Biofeedback training with therapies to help reduce reliance on opiate pain medications, monitor pain control during therapies, and sedation at rest and titrate to maximum efficacy to ensure participation and gains in therapies), hyperkalemia (continue to  monitor, treat as necessary), ABLA (transfuse if necessary to ensure appropriate perfusion for increased activity tolerance) 3. Due to safety, skin/wound care, disease management and patient education, does the patient require 24 hr/day rehab nursing? Yes 4. Does the patient require coordinated care of a physician, rehab nurse, PT (1-2 hrs/day, 5 days/week) and OT (1-2 hrs/day, 5 days/week) to address physical and functional deficits in the context of the above medical diagnosis(es)? Yes Addressing deficits in the following areas: balance, endurance, locomotion, strength, transferring, bowel/bladder control, bathing, dressing, toileting and psychosocial support 5. Can the patient actively participate in an intensive therapy program of at least 3 hrs of therapy per day at least 5 days per week? Yes 6. The potential for patient to make measurable gains while on inpatient rehab  is excellent 7. Anticipated functional outcomes upon discharge from inpatient rehab are min assist  with PT, min assist with OT, n/a with SLP. 8. Estimated rehab length of stay to reach the above functional goals is: 14-17 days. 9. Anticipated D/C setting: Home 10. Anticipated post D/C treatments: HH therapy and Home excercise program 11. Overall Rehab/Functional Prognosis: good  RECOMMENDATIONS: This patient's condition is appropriate for continued rehabilitative care in the following setting: CIR if caregiver support available upon discharge Patient has agreed to participate in recommended program. Yes Note that insurance prior authorization may be required for reimbursement for recommended care.  Comment: Rehab Admissions Coordinator to follow up.   I have personally performed a face to face diagnostic evaluation, including, but not limited to relevant history and physical exam findings, of this patient and developed relevant assessment and plan.  Additionally, I have reviewed and concur with the physician assistant's documentation above.   Maryla Morrow, MD, ABPMR Jacquelynn Cree, PA-C 04/12/2018

## 2018-04-25 ENCOUNTER — Other Ambulatory Visit: Payer: Self-pay | Admitting: Internal Medicine

## 2018-04-27 ENCOUNTER — Encounter: Payer: Self-pay | Admitting: Physician Assistant
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
## 2018-04-28 ENCOUNTER — Ambulatory Visit: Payer: Medicaid Other | Admitting: Physician Assistant

## 2018-04-29 ENCOUNTER — Ambulatory Visit (INDEPENDENT_AMBULATORY_CARE_PROVIDER_SITE_OTHER): Payer: Medicaid Other | Admitting: Internal Medicine

## 2018-04-29 ENCOUNTER — Encounter: Payer: Self-pay | Admitting: Internal Medicine

## 2018-04-29 VITALS — BP 114/64 | HR 98 | Ht 73.0 in | Wt 199.4 lb

## 2018-04-29 DIAGNOSIS — R5381 Other malaise: Secondary | ICD-10-CM

## 2018-04-29 DIAGNOSIS — J849 Interstitial pulmonary disease, unspecified: Secondary | ICD-10-CM

## 2018-04-29 DIAGNOSIS — R7989 Other specified abnormal findings of blood chemistry: Secondary | ICD-10-CM

## 2018-04-29 DIAGNOSIS — R768 Other specified abnormal immunological findings in serum: Secondary | ICD-10-CM

## 2018-04-29 LAB — PULMONARY FUNCTION TEST
DL/VA % PRED: 64 %
DL/VA: 3.01 ml/min/mmHg/L
DLCO COR: 13.22 ml/min/mmHg
DLCO UNC % PRED: 35 %
DLCO cor % pred: 38 %
DLCO unc: 12.18 ml/min/mmHg
FEF 25-75 PRE: 3.61 L/s
FEF2575-%Pred-Pre: 120 %
FEV1-%PRED-PRE: 66 %
FEV1-Pre: 2.48 L
FEV1FVC-%PRED-PRE: 114 %
FEV6-%PRED-PRE: 61 %
FEV6-Pre: 2.88 L
FEV6FVC-%PRED-PRE: 105 %
FVC-%Pred-Pre: 58 %
FVC-Pre: 2.88 L
PRE FEV6/FVC RATIO: 100 %
Pre FEV1/FVC ratio: 86 %

## 2018-04-29 NOTE — Progress Notes (Signed)
Subjective:     Patient ID: [name], male   DOB: Aug 17, 1956, 62 y.o.   MRN: [id number]  HPI   OV 04/29/2018  Chief Complaint  Patient presents with  . Follow-up    patient was in the hospital for sepsis 04/04/18, pft done today.     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 At last visit contemplating starting CellCept. He returns to permit function test that shows continued stability/improvement of his FVC by in talking to him and his wife it appears that overall his functional status has declined. He has admissions for sepsis which are noted in the review of the chart as well. He was discharged onJune 11 2019 to [facility]. The wife is upset they're not doingin a physical therapy. She says she is so deconditioned he is not able to get out of the chair. His ECOG is 4. In fact today's having some worsening shivering. But they refused to go to the ER. She also admitted to the fact that he is not increased pallor and some discoloration in his shin.recent labs are documented below  Results for Ryan, [name] (MRN [id number]) as of 04/29/2018 17:07  Ref. Range 02/25/2017 15:45 05/27/2017 15:27 12/17/2017 10:26 04/29/2018 15:47  FVC-Pre Latest Units: L 2.57 2.85 2.86 2.88  FVC-%Pred-Pre Latest Units: % 49 54 57 58  Results for Ryan, [name] (MRN [id number]) as of 04/29/2018 17:07  Ref. Range 02/25/2017 15:45 05/27/2017 15:27 12/17/2017 10:26 04/29/2018 15:47  DLCO unc Latest Units: ml/min/mmHg 11.65 13.66 11.16 12.18  DLCO unc % pred Latest Units: % 32 37 32 35   Results for Ryan, [name] (MRN [id number]) as of 04/29/2018 17:07  Ref. Range 04/12/2018 05:31  Creatinine Latest Ref Range: 0.61 - 1.24 mg/dL 8.81      has a past medical history of AAA (abdominal aortic aneurysm) (HCC), AKI (acute kidney injury) (HCC), Arthritis, CAD in native artery, Carotid artery disease (HCC), Chronic combined systolic and diastolic CHF (congestive heart failure)  (HCC), GERD (gastroesophageal reflux disease), Hyperkalemia, ILD (interstitial lung disease) (HCC), Lung nodule, MI (myocardial infarction) (HCC), Normocytic anemia, NSVT (nonsustained ventricular tachycardia) (HCC), Pancreatitis, Pneumonia (11/2017), RA (rheumatoid arthritis) (HCC), Stroke (HCC), and Tobacco abuse.   reports that he has been smoking cigarettes.  He has a 23.50 pack-year smoking history. He has never used smokeless tobacco.  Past Surgical History:  Procedure Laterality Date  . CARDIAC CATHETERIZATION    . CAROTID ENDARTERECTOMY Left   . Heart Bypass  10/1992  . LEFT HEART CATH AND CORS/GRAFTS ANGIOGRAPHY N/A 03/01/2017   Procedure: Left Heart Cath and Cors/Grafts Angiography;  Surgeon: Lyn Records, MD;  Location: [facility];  Service: Cardiovascular;  Laterality: N/A;  . Throat Biopsy     Cat scratch fever    No Known Allergies  Immunization History  Administered Date(s) Administered  . Influenza,inj,Quad PF,6+ Mos 11/02/2016, 09/27/2017  . Pneumococcal Polysaccharide-23 04/09/2018  . Pneumococcal-Unspecified 11/02/2016  . Tdap 03/09/2017    Family History  Problem Relation Age of Onset  . Heart murmur Mother   . Heart disease Mother   . Heart attack Mother   . Suicidality Father   . Heart attack Father   . Stroke Brother   . Heart attack Brother   . Heart attack Maternal Grandmother   . Cancer Maternal Grandmother        unknown type  . Lung cancer Maternal Aunt   . Lung cancer Maternal Uncle   .  Cancer Maternal Aunt        unknown cancer  . Heart attack Brother      Current Outpatient Medications:  .  acetaminophen (TYLENOL) 500 MG tablet, Take 1,000 mg by mouth every 6 (six) hours as needed for mild pain. , Disp: , Rfl:  .  albuterol (ACCUNEB) 0.63 MG/3ML nebulizer solution, Take 3 mLs by nebulization every 6 (six) hours as needed for wheezing., Disp: , Rfl: 12 .  albuterol (ACCUNEB) 0.63 MG/3ML nebulizer solution, TAKE 3 MLS (0.63 MG  TOTAL) BY NEBULIZATION EVERY 6 (SIX) HOURS AS NEEDED FOR WHEEZING., Disp: 75 mL, Rfl: 12 .  aspirin 81 MG chewable tablet, Chew 1 tablet (81 mg total) by mouth daily. Take for 3 weeks, then stop and take plavix only, Disp: 30 tablet, Rfl: 0 .  atorvastatin (LIPITOR) 40 MG tablet, Take 1 tablet (40 mg total) by mouth daily at 6 PM., Disp: 30 tablet, Rfl: 11 .  carvedilol (COREG) 3.125 MG tablet, Take 1 tablet (3.125 mg total) by mouth 2 (two) times daily with a meal., Disp: 60 tablet, Rfl: 0 .  clopidogrel (PLAVIX) 75 MG tablet, Take 1 tablet (75 mg total) by mouth daily., Disp: 30 tablet, Rfl: 0 .  collagenase (SANTYL) ointment, Apply topically daily. Apply Santyl to sacrum/gluteal fold areas Q day, and cover with moist fluffed gauze and foam dressing.  (Change foam dressing Q 3 days or PRN soiling.), Disp: 15 g, Rfl: 0 .  gabapentin (NEURONTIN) 300 MG capsule, Take 1 capsule (300 mg total) by mouth every 8 (eight) hours., Disp: 90 capsule, Rfl: 4 .  HYDROcodone-acetaminophen (NORCO) 10-325 MG tablet, Take 1 tablet by mouth 3 (three) times daily as needed for severe pain., Disp: 90 tablet, Rfl: 0 .  hydroxychloroquine (PLAQUENIL) 200 MG tablet, Take 200 mg by mouth 2 (two) times daily., Disp: , Rfl: 3 .  Menthol-Methyl Salicylate (MUSCLE RUB EX), Apply 1 application topically as needed (for shoulder or knee pain). , Disp: , Rfl:  .  nicotine (NICODERM CQ - DOSED IN MG/24 HR) 7 mg/24hr patch, Place 1 patch (7 mg total) onto the skin daily., Disp: 28 patch, Rfl: 0 .  OXYGEN, Inhale 2 L into the lungs at bedtime., Disp: , Rfl:  .  polyethylene glycol (MIRALAX / GLYCOLAX) packet, Take 17 g by mouth 2 (two) times daily. Until having 1 soft BM daily, then can decrease to daily or as needed, Disp: 14 each, Rfl: 0 .  predniSONE (DELTASONE) 10 MG tablet, Take 10 mg daily, Disp: , Rfl:  .  PROVENTIL HFA 108 (90 Base) MCG/ACT inhaler, INHALE 2 PUFFS EVERY 6 HOURS AS NEEDED FOR WHEEZE/SHORTNESS OF BREATH, Disp:  6.7 g, Rfl: 6   Review of Systems     Objective:   Physical Exam Vitals:   04/29/18 1635  BP: 114/64  Pulse: 98  SpO2: 95%  Weight: 199 lb 6.4 oz (90.4 kg)  Height: 6\' 1"  (1.854 m)    Estimated body mass index is 26.31 kg/m as calculated from the following:   Height as of this encounter: 6\' 1"  (1.854 m).   Weight as of this encounter: 199 lb 6.4 oz (90.4 kg).   General Appearance:    Looks deteriorated very deconditioned sitting in a wheelchair with a blanket and shivering but not warm to touch. Not cold to touch either. Normothermic  Head:    Normocephalic, without obvious abnormality, atraumatic  Eyes:    PERRL - yes, conjunctiva/corneas - clear  Ears:    Normal external ear canals, both ears  Nose:   NG tube - no  Throat:  ETT TUBE - no , OG tube - no  Neck:   Supple,  No enlargement/tenderness/nodules     Lungs:     Clear to auscultation bilaterallybut is crackles in the base  Chest wall:    No deformity  Heart:    S1 and S2 normal, no murmur, CVP - no.  Pressors - nown  Abdomen:     Soft, no masses, no organomegaly  Genitalia:    Not done  Rectal:   not done  Extremities:   Extremities- intact but diffusely weak     Skin:   Intact in exposed areas .pale hands     Neurologic:   Sedation - none -> RASS - +1 . Moves all 4s - yes. CAM-ICU - negative for delirium . Orientation - 3+         Assessment:       ICD-10-CM   1. ILD (interstitial lung disease) (HCC) J84.9   2. 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 Declining performance status R53.81    Worsening performance status is new finding    Plan:       interstitial lung disease secondary to rheumatoid arthritis appears stable However you have declining performance status and currently it is very poor  Plan  - Recommended ER visit  but respect the fact that you do not want to do that - No immune suppression still functional status ever improves - Have low threshold to go  to the emergency department depending on clinical status as determined by you and [place]  Follow-up 6-9 months or sooner if needed   > 50% of this > 25 min visit spent in face to face counseling or coordination of care - by this undersigned MD - Dr Kalman Shan. This includes one or more of the following documented above: discussion of test results, diagnostic or treatment recommendations, prognosis, risks and benefits of management options, instructions, education, compliance or risk-factor reduction   Dr. Kalman Shan, M.D., [facility].C.P Pulmonary and Critical Care Medicine Staff Physician, [facility] Center Director - Interstitial Lung Disease  Program  Pulmonary Fibrosis [facility] at [facility] [place], [place], [address]  Pager: [phone], If no answer or between  15:00h - 7:00h: call [phone] Telephone: [phone]

## 2018-04-29 NOTE — Progress Notes (Signed)
Spirometry and dlco done today. 

## 2018-04-29 NOTE — Patient Instructions (Signed)
ICD-10-CM   1. ILD (interstitial lung disease) (HCC) J84.9   2. 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

## 2018-05-03 LAB — PULMONARY FUNCTION TEST
DL/VA % pred: 56 %
DL/VA: 2.68 ml/min/mmHg/L
DLCO COR % PRED: 33 %
DLCO COR: 11.46 ml/min/mmHg
DLCO UNC: 11.16 ml/min/mmHg
DLCO unc % pred: 32 %
FEF 25-75 Pre: 2.79 L/sec
FEF2575-%Pred-Pre: 92 %
FEV1-%Pred-Pre: 65 %
FEV1-Pre: 2.46 L
FEV1FVC-%Pred-Pre: 114 %
FEV6-%PRED-PRE: 60 %
FEV6-Pre: 2.86 L
FEV6FVC-%Pred-Pre: 105 %
FVC-%PRED-PRE: 57 %
FVC-Pre: 2.86 L
PRE FEV1/FVC RATIO: 86 %
PRE FEV6/FVC RATIO: 100 %

## 2018-05-09 ENCOUNTER — Inpatient Hospital Stay (HOSPITAL_COMMUNITY): Payer: Medicaid Other | Admitting: Certified Registered Nurse Anesthetist

## 2018-05-09 ENCOUNTER — Ambulatory Visit: Payer: Medicaid Other | Admitting: Physician Assistant

## 2018-05-09 ENCOUNTER — Encounter (HOSPITAL_COMMUNITY): Payer: Self-pay | Admitting: *Deleted

## 2018-05-09 ENCOUNTER — Emergency Department (HOSPITAL_COMMUNITY): Payer: Medicaid Other

## 2018-05-09 ENCOUNTER — Encounter (HOSPITAL_COMMUNITY)
Admission: EM | Disposition: A | Discharge status: Skilled Nursing Facility | Payer: Self-pay | Source: Home / Self Care | Attending: Internal Medicine

## 2018-05-09 ENCOUNTER — Inpatient Hospital Stay (HOSPITAL_COMMUNITY)
Admission: EM | Admit: 2018-05-09 | Discharge: 2018-05-18 | DRG: 501 | Disposition: A | Discharge status: Skilled Nursing Facility | Payer: Medicaid Other | Attending: Internal Medicine | Admitting: Internal Medicine

## 2018-05-09 DIAGNOSIS — F17210 Nicotine dependence, cigarettes, uncomplicated: Secondary | ICD-10-CM | POA: Diagnosis present

## 2018-05-09 DIAGNOSIS — I5042 Chronic combined systolic (congestive) and diastolic (congestive) heart failure: Secondary | ICD-10-CM | POA: Diagnosis present

## 2018-05-09 DIAGNOSIS — I10 Essential (primary) hypertension: Secondary | ICD-10-CM | POA: Diagnosis not present

## 2018-05-09 DIAGNOSIS — Z7902 Long term (current) use of antithrombotics/antiplatelets: Secondary | ICD-10-CM | POA: Diagnosis not present

## 2018-05-09 DIAGNOSIS — J849 Interstitial pulmonary disease, unspecified: Secondary | ICD-10-CM | POA: Diagnosis present

## 2018-05-09 DIAGNOSIS — E271 Primary adrenocortical insufficiency: Secondary | ICD-10-CM | POA: Diagnosis present

## 2018-05-09 DIAGNOSIS — B9689 Other specified bacterial agents as the cause of diseases classified elsewhere: Secondary | ICD-10-CM | POA: Diagnosis not present

## 2018-05-09 DIAGNOSIS — L0231 Cutaneous abscess of buttock: Secondary | ICD-10-CM | POA: Diagnosis present

## 2018-05-09 DIAGNOSIS — I714 Abdominal aortic aneurysm, without rupture, unspecified: Secondary | ICD-10-CM | POA: Diagnosis present

## 2018-05-09 DIAGNOSIS — Z8249 Family history of ischemic heart disease and other diseases of the circulatory system: Secondary | ICD-10-CM | POA: Diagnosis not present

## 2018-05-09 DIAGNOSIS — Z9981 Dependence on supplemental oxygen: Secondary | ICD-10-CM

## 2018-05-09 DIAGNOSIS — A419 Sepsis, unspecified organism: Secondary | ICD-10-CM

## 2018-05-09 DIAGNOSIS — R0902 Hypoxemia: Secondary | ICD-10-CM | POA: Diagnosis not present

## 2018-05-09 DIAGNOSIS — I6930 Unspecified sequelae of cerebral infarction: Secondary | ICD-10-CM | POA: Diagnosis not present

## 2018-05-09 DIAGNOSIS — Z716 Tobacco abuse counseling: Secondary | ICD-10-CM

## 2018-05-09 DIAGNOSIS — I2510 Atherosclerotic heart disease of native coronary artery without angina pectoris: Secondary | ICD-10-CM | POA: Diagnosis present

## 2018-05-09 DIAGNOSIS — E2740 Unspecified adrenocortical insufficiency: Secondary | ICD-10-CM | POA: Diagnosis present

## 2018-05-09 DIAGNOSIS — I252 Old myocardial infarction: Secondary | ICD-10-CM | POA: Diagnosis not present

## 2018-05-09 DIAGNOSIS — J449 Chronic obstructive pulmonary disease, unspecified: Secondary | ICD-10-CM | POA: Diagnosis present

## 2018-05-09 DIAGNOSIS — K219 Gastro-esophageal reflux disease without esophagitis: Secondary | ICD-10-CM | POA: Diagnosis present

## 2018-05-09 DIAGNOSIS — M069 Rheumatoid arthritis, unspecified: Secondary | ICD-10-CM | POA: Diagnosis not present

## 2018-05-09 DIAGNOSIS — B379 Candidiasis, unspecified: Secondary | ICD-10-CM | POA: Diagnosis not present

## 2018-05-09 DIAGNOSIS — Z7952 Long term (current) use of systemic steroids: Secondary | ICD-10-CM

## 2018-05-09 DIAGNOSIS — I110 Hypertensive heart disease with heart failure: Secondary | ICD-10-CM | POA: Diagnosis present

## 2018-05-09 DIAGNOSIS — I25118 Atherosclerotic heart disease of native coronary artery with other forms of angina pectoris: Secondary | ICD-10-CM | POA: Diagnosis present

## 2018-05-09 DIAGNOSIS — Z8673 Personal history of transient ischemic attack (TIA), and cerebral infarction without residual deficits: Secondary | ICD-10-CM

## 2018-05-09 DIAGNOSIS — R6521 Severe sepsis with septic shock: Secondary | ICD-10-CM | POA: Diagnosis not present

## 2018-05-09 DIAGNOSIS — Z7982 Long term (current) use of aspirin: Secondary | ICD-10-CM

## 2018-05-09 DIAGNOSIS — Z8739 Personal history of other diseases of the musculoskeletal system and connective tissue: Secondary | ICD-10-CM | POA: Diagnosis not present

## 2018-05-09 DIAGNOSIS — Z79899 Other long term (current) drug therapy: Secondary | ICD-10-CM

## 2018-05-09 DIAGNOSIS — R911 Solitary pulmonary nodule: Secondary | ICD-10-CM | POA: Diagnosis present

## 2018-05-09 DIAGNOSIS — M726 Necrotizing fasciitis: Secondary | ICD-10-CM | POA: Diagnosis present

## 2018-05-09 DIAGNOSIS — I4729 Other ventricular tachycardia: Secondary | ICD-10-CM

## 2018-05-09 DIAGNOSIS — M868X8 Other osteomyelitis, other site: Secondary | ICD-10-CM | POA: Diagnosis present

## 2018-05-09 DIAGNOSIS — I472 Ventricular tachycardia: Secondary | ICD-10-CM | POA: Diagnosis not present

## 2018-05-09 DIAGNOSIS — I25810 Atherosclerosis of coronary artery bypass graft(s) without angina pectoris: Secondary | ICD-10-CM | POA: Diagnosis not present

## 2018-05-09 DIAGNOSIS — R6520 Severe sepsis without septic shock: Secondary | ICD-10-CM | POA: Diagnosis not present

## 2018-05-09 DIAGNOSIS — D649 Anemia, unspecified: Secondary | ICD-10-CM | POA: Diagnosis not present

## 2018-05-09 DIAGNOSIS — M549 Dorsalgia, unspecified: Secondary | ICD-10-CM | POA: Diagnosis not present

## 2018-05-09 DIAGNOSIS — J9611 Chronic respiratory failure with hypoxia: Secondary | ICD-10-CM | POA: Diagnosis present

## 2018-05-09 DIAGNOSIS — Z951 Presence of aortocoronary bypass graft: Secondary | ICD-10-CM

## 2018-05-09 DIAGNOSIS — Z77120 Contact with and (suspected) exposure to mold (toxic): Secondary | ICD-10-CM

## 2018-05-09 DIAGNOSIS — L8990 Pressure ulcer of unspecified site, unspecified stage: Secondary | ICD-10-CM | POA: Diagnosis not present

## 2018-05-09 DIAGNOSIS — L89312 Pressure ulcer of right buttock, stage 2: Secondary | ICD-10-CM | POA: Diagnosis not present

## 2018-05-09 DIAGNOSIS — Z801 Family history of malignant neoplasm of trachea, bronchus and lung: Secondary | ICD-10-CM | POA: Diagnosis not present

## 2018-05-09 DIAGNOSIS — M8618 Other acute osteomyelitis, other site: Secondary | ICD-10-CM | POA: Diagnosis not present

## 2018-05-09 HISTORY — PX: INCISION AND DRAINAGE PERIRECTAL ABSCESS: SHX1804

## 2018-05-09 LAB — COMPREHENSIVE METABOLIC PANEL
ALT: 29 U/L (ref 0–44)
AST: 30 U/L (ref 15–41)
Albumin: 2.2 g/dL — ABNORMAL LOW (ref 3.5–5.0)
Alkaline Phosphatase: 127 U/L — ABNORMAL HIGH (ref 38–126)
Anion gap: 10 (ref 5–15)
BUN: 19 mg/dL (ref 8–23)
CO2: 24 mmol/L (ref 22–32)
Calcium: 8 mg/dL — ABNORMAL LOW (ref 8.9–10.3)
Chloride: 99 mmol/L (ref 98–111)
Creatinine, Ser: 0.77 mg/dL (ref 0.61–1.24)
GFR calc Af Amer: >60 mL/min (ref >60)
GFR calc non Af Amer: >60 mL/min (ref >60)
Glucose, Bld: 178 mg/dL — ABNORMAL HIGH (ref 70–99)
Potassium: 4 mmol/L (ref 3.5–5.1)
Sodium: 133 mmol/L — ABNORMAL LOW (ref 135–145)
Total Bilirubin: 0.8 mg/dL (ref 0.3–1.2)
Total Protein: 6.5 g/dL (ref 6.5–8.1)

## 2018-05-09 LAB — CBC WITH DIFFERENTIAL/PLATELET
Basophils Absolute: 0 10*3/uL (ref 0.0–0.1)
Basophils Relative: 0 %
Eosinophils Absolute: 0 10*3/uL (ref 0.0–0.7)
Eosinophils Relative: 0 %
HCT: 34.7 % — ABNORMAL LOW (ref 39.0–52.0)
Hemoglobin: 11 g/dL — ABNORMAL LOW (ref 13.0–17.0)
Lymphocytes Relative: 9 %
Lymphs Abs: 0.8 10*3/uL (ref 0.7–4.0)
MCH: 27.3 pg (ref 26.0–34.0)
MCHC: 31.7 g/dL (ref 30.0–36.0)
MCV: 86.1 fL (ref 78.0–100.0)
Monocytes Absolute: 0.8 10*3/uL (ref 0.1–1.0)
Monocytes Relative: 8 %
Neutro Abs: 7.8 10*3/uL — ABNORMAL HIGH (ref 1.7–7.7)
Neutrophils Relative %: 83 %
Platelets: 167 10*3/uL (ref 150–400)
RBC: 4.03 MIL/uL — ABNORMAL LOW (ref 4.22–5.81)
RDW: 15.8 % — ABNORMAL HIGH (ref 11.5–15.5)
WBC: 9.4 10*3/uL (ref 4.0–10.5)

## 2018-05-09 LAB — I-STAT CG4 LACTIC ACID, ED
Lactic Acid, Venous: 2.54 mmol/L (ref 0.5–1.9)
Lactic Acid, Venous: 2.28 mmol/L (ref 0.5–1.9)

## 2018-05-09 LAB — URINALYSIS, ROUTINE W REFLEX MICROSCOPIC
Bilirubin Urine: NEGATIVE
Glucose, UA: NEGATIVE mg/dL
Hgb urine dipstick: NEGATIVE
Ketones, ur: NEGATIVE mg/dL
Leukocytes, UA: NEGATIVE
Nitrite: NEGATIVE
Protein, ur: NEGATIVE mg/dL
Specific Gravity, Urine: 1.014 (ref 1.005–1.030)
pH: 5 (ref 5.0–8.0)

## 2018-05-09 LAB — PROTIME-INR
INR: 1.08
Prothrombin Time: 13.9 seconds (ref 11.4–15.2)

## 2018-05-09 LAB — BRAIN NATRIURETIC PEPTIDE: B Natriuretic Peptide: 232.1 pg/mL — ABNORMAL HIGH (ref 0.0–100.0)

## 2018-05-09 LAB — I-STAT TROPONIN, ED: Troponin i, poc: 0.08 ng/mL (ref 0.00–0.08)

## 2018-05-09 SURGERY — INCISION AND DRAINAGE, ABSCESS, PERIRECTAL
Anesthesia: General | Site: Buttocks | Laterality: Right

## 2018-05-09 MED ORDER — 0.9 % SODIUM CHLORIDE (POUR BTL) OPTIME
TOPICAL | Status: DC | PRN
  Administered 2018-05-09: 1000 mL

## 2018-05-09 MED ORDER — LIDOCAINE 2% (20 MG/ML) 5 ML SYRINGE
INTRAMUSCULAR | Status: DC | PRN
  Administered 2018-05-09: 100 mg via INTRAVENOUS

## 2018-05-09 MED ORDER — FENTANYL CITRATE (PF) 100 MCG/2ML IJ SOLN
INTRAMUSCULAR | Status: AC
  Filled 2018-05-09: qty 2

## 2018-05-09 MED ORDER — LACTATED RINGERS IV SOLN: INTRAVENOUS | Status: DC | PRN

## 2018-05-09 MED ORDER — IPRATROPIUM-ALBUTEROL 0.5-2.5 (3) MG/3ML IN SOLN
3.0000 mL | Freq: Four times a day (QID) | RESPIRATORY_TRACT | Status: DC
  Administered 2018-05-09 – 2018-05-11 (×9): 3 mL via RESPIRATORY_TRACT
  Filled 2018-05-09 (×9): qty 3

## 2018-05-09 MED ORDER — SODIUM CHLORIDE 0.9 % IV BOLUS
1000.0000 mL | Freq: Once | INTRAVENOUS | Status: AC
Start: 2018-05-09 — End: 2018-05-09
  Administered 2018-05-09: 1000 mL via INTRAVENOUS

## 2018-05-09 MED ORDER — LACTATED RINGERS IV SOLN
INTRAVENOUS | Status: DC
  Administered 2018-05-09: 22:00:00 via INTRAVENOUS

## 2018-05-09 MED ORDER — LIDOCAINE 2% (20 MG/ML) 5 ML SYRINGE
INTRAMUSCULAR | Status: AC
  Filled 2018-05-09: qty 5

## 2018-05-09 MED ORDER — CLINDAMYCIN PHOSPHATE 600 MG/50ML IV SOLN
600.0000 mg | Freq: Once | INTRAVENOUS | Status: AC
  Administered 2018-05-09: 600 mg via INTRAVENOUS
  Filled 2018-05-09: qty 50

## 2018-05-09 MED ORDER — HYDROCODONE-ACETAMINOPHEN 5-325 MG PO TABS
1.0000 | ORAL_TABLET | ORAL | Status: DC | PRN
  Administered 2018-05-11 – 2018-05-12 (×4): 2 via ORAL
  Administered 2018-05-13 – 2018-05-14 (×3): 1 via ORAL
  Administered 2018-05-14 – 2018-05-15 (×3): 2 via ORAL
  Administered 2018-05-15: 1 via ORAL
  Administered 2018-05-16 (×2): 2 via ORAL
  Filled 2018-05-09 (×3): qty 2
  Filled 2018-05-09: qty 1
  Filled 2018-05-09 (×2): qty 2
  Filled 2018-05-09: qty 1
  Filled 2018-05-09 (×3): qty 2
  Filled 2018-05-09: qty 1
  Filled 2018-05-09: qty 2
  Filled 2018-05-09: qty 1

## 2018-05-09 MED ORDER — SODIUM CHLORIDE 0.9 % IV BOLUS
1000.0000 mL | Freq: Once | INTRAVENOUS | Status: AC
  Administered 2018-05-09: 1000 mL via INTRAVENOUS

## 2018-05-09 MED ORDER — ACETAMINOPHEN 325 MG PO TABS: 650.0000 mg | ORAL_TABLET | ORAL | Status: DC | PRN

## 2018-05-09 MED ORDER — SUCCINYLCHOLINE CHLORIDE 200 MG/10ML IV SOSY
PREFILLED_SYRINGE | INTRAVENOUS | Status: DC | PRN
  Administered 2018-05-09: 100 mg via INTRAVENOUS

## 2018-05-09 MED ORDER — PIPERACILLIN-TAZOBACTAM 3.375 G IVPB
3.3750 g | Freq: Three times a day (TID) | INTRAVENOUS | Status: DC
  Administered 2018-05-09 – 2018-05-16 (×21): 3.375 g via INTRAVENOUS
  Filled 2018-05-09 (×21): qty 50

## 2018-05-09 MED ORDER — ACETAMINOPHEN 325 MG PO TABS
650.0000 mg | ORAL_TABLET | Freq: Once | ORAL | Status: AC
  Administered 2018-05-09: 650 mg via ORAL
  Filled 2018-05-09: qty 2

## 2018-05-09 MED ORDER — PIPERACILLIN-TAZOBACTAM 3.375 G IVPB 30 MIN
3.3750 g | Freq: Once | INTRAVENOUS | Status: AC
  Administered 2018-05-09: 3.375 g via INTRAVENOUS
  Filled 2018-05-09: qty 50

## 2018-05-09 MED ORDER — HYDROMORPHONE HCL 1 MG/ML IJ SOLN
1.0000 mg | INTRAMUSCULAR | Status: DC | PRN
  Administered 2018-05-10 – 2018-05-12 (×11): 1 mg via INTRAVENOUS
  Filled 2018-05-09 (×12): qty 1

## 2018-05-09 MED ORDER — PROPOFOL 10 MG/ML IV BOLUS
INTRAVENOUS | Status: DC | PRN
  Administered 2018-05-09: 120 mg via INTRAVENOUS
  Administered 2018-05-09: 40 mg via INTRAVENOUS

## 2018-05-09 MED ORDER — GABAPENTIN 300 MG PO CAPS
300.0000 mg | ORAL_CAPSULE | Freq: Three times a day (TID) | ORAL | Status: DC
  Administered 2018-05-09 – 2018-05-18 (×27): 300 mg via ORAL
  Filled 2018-05-09 (×27): qty 1

## 2018-05-09 MED ORDER — VANCOMYCIN HCL IN DEXTROSE 1-5 GM/200ML-% IV SOLN: 1000.0000 mg | Freq: Once | INTRAVENOUS | Status: DC

## 2018-05-09 MED ORDER — HYDROCORTISONE NA SUCCINATE PF 100 MG IJ SOLR
100.0000 mg | Freq: Once | INTRAMUSCULAR | Status: AC
  Administered 2018-05-09: 100 mg via INTRAVENOUS
  Filled 2018-05-09: qty 2

## 2018-05-09 MED ORDER — ONDANSETRON HCL 4 MG/2ML IJ SOLN
INTRAMUSCULAR | Status: DC | PRN
  Administered 2018-05-09: 4 mg via INTRAVENOUS

## 2018-05-09 MED ORDER — PHENYLEPHRINE HCL 10 MG/ML IJ SOLN
INTRAMUSCULAR | Status: AC
  Filled 2018-05-09: qty 1

## 2018-05-09 MED ORDER — MIDAZOLAM HCL 2 MG/2ML IJ SOLN
INTRAMUSCULAR | Status: AC
  Filled 2018-05-09: qty 2

## 2018-05-09 MED ORDER — TRAMADOL HCL 50 MG PO TABS: 50.0000 mg | ORAL_TABLET | Freq: Four times a day (QID) | ORAL | Status: DC | PRN

## 2018-05-09 MED ORDER — SODIUM CHLORIDE 0.9 % IV SOLN
INTRAVENOUS | Status: DC | PRN
  Administered 2018-05-09: 20:00:00 via INTRAVENOUS

## 2018-05-09 MED ORDER — IOPAMIDOL (ISOVUE-300) INJECTION 61%
INTRAVENOUS | Status: AC
  Filled 2018-05-09: qty 100

## 2018-05-09 MED ORDER — ONDANSETRON 4 MG PO TBDP: 4.0000 mg | ORAL_TABLET | Freq: Four times a day (QID) | ORAL | Status: DC | PRN

## 2018-05-09 MED ORDER — PROPOFOL 10 MG/ML IV BOLUS
INTRAVENOUS | Status: AC
  Filled 2018-05-09: qty 20

## 2018-05-09 MED ORDER — MEPERIDINE HCL 50 MG/ML IJ SOLN: 6.2500 mg | INTRAMUSCULAR | Status: DC | PRN

## 2018-05-09 MED ORDER — MORPHINE SULFATE (PF) 4 MG/ML IV SOLN
4.0000 mg | Freq: Once | INTRAVENOUS | Status: AC
  Administered 2018-05-09: 4 mg via INTRAVENOUS
  Filled 2018-05-09: qty 1

## 2018-05-09 MED ORDER — KCL IN DEXTROSE-NACL 20-5-0.45 MEQ/L-%-% IV SOLN
INTRAVENOUS | Status: DC
  Administered 2018-05-09 – 2018-05-11 (×4): via INTRAVENOUS
  Filled 2018-05-09 (×5): qty 1000

## 2018-05-09 MED ORDER — HYDROMORPHONE HCL 1 MG/ML IJ SOLN: 0.2500 mg | INTRAMUSCULAR | Status: DC | PRN

## 2018-05-09 MED ORDER — LACTATED RINGERS IV SOLN
INTRAVENOUS | Status: DC | PRN
  Administered 2018-05-09: 21:00:00 via INTRAVENOUS

## 2018-05-09 MED ORDER — IOPAMIDOL (ISOVUE-300) INJECTION 61%
100.0000 mL | Freq: Once | INTRAVENOUS | Status: AC | PRN
  Administered 2018-05-09: 100 mL via INTRAVENOUS

## 2018-05-09 MED ORDER — ENOXAPARIN SODIUM 40 MG/0.4ML ~~LOC~~ SOLN: 40.0000 mg | SUBCUTANEOUS | Status: DC

## 2018-05-09 MED ORDER — PIPERACILLIN-TAZOBACTAM 3.375 G IVPB 30 MIN: 3.3750 g | Freq: Once | INTRAVENOUS | Status: DC

## 2018-05-09 MED ORDER — NICOTINE 7 MG/24HR TD PT24
7.0000 mg | MEDICATED_PATCH | Freq: Every day | TRANSDERMAL | Status: DC
  Administered 2018-05-10 – 2018-05-18 (×9): 7 mg via TRANSDERMAL
  Filled 2018-05-09 (×9): qty 1

## 2018-05-09 MED ORDER — HYDROCORTISONE NA SUCCINATE PF 100 MG IJ SOLR
50.0000 mg | Freq: Four times a day (QID) | INTRAMUSCULAR | Status: DC
Start: 2018-05-09 — End: 2018-05-12
  Administered 2018-05-09 – 2018-05-12 (×10): 50 mg via INTRAVENOUS
  Filled 2018-05-09 (×10): qty 2

## 2018-05-09 MED ORDER — SODIUM CHLORIDE 0.9 % IV SOLN: 250.0000 mL | INTRAVENOUS | Status: DC | PRN

## 2018-05-09 MED ORDER — ONDANSETRON HCL 4 MG/2ML IJ SOLN: 4.0000 mg | Freq: Once | INTRAMUSCULAR | Status: DC | PRN

## 2018-05-09 MED ORDER — ACETAMINOPHEN 325 MG PO TABS: 650.0000 mg | ORAL_TABLET | Freq: Four times a day (QID) | ORAL | Status: DC | PRN

## 2018-05-09 MED ORDER — VANCOMYCIN HCL 10 G IV SOLR
1250.0000 mg | Freq: Two times a day (BID) | INTRAVENOUS | Status: DC
  Administered 2018-05-10 – 2018-05-11 (×3): 1250 mg via INTRAVENOUS
  Filled 2018-05-09 (×5): qty 1250

## 2018-05-09 MED ORDER — ACETAMINOPHEN 650 MG RE SUPP: 650.0000 mg | Freq: Four times a day (QID) | RECTAL | Status: DC | PRN

## 2018-05-09 MED ORDER — VANCOMYCIN HCL 10 G IV SOLR
2000.0000 mg | INTRAVENOUS | Status: AC
  Administered 2018-05-09: 2000 mg via INTRAVENOUS
  Filled 2018-05-09: qty 2000

## 2018-05-09 MED ORDER — CLINDAMYCIN PHOSPHATE 600 MG/50ML IV SOLN
600.0000 mg | Freq: Three times a day (TID) | INTRAVENOUS | Status: DC
  Administered 2018-05-09 – 2018-05-11 (×6): 600 mg via INTRAVENOUS
  Filled 2018-05-09 (×5): qty 50

## 2018-05-09 MED ORDER — HYDROCODONE-ACETAMINOPHEN 10-325 MG PO TABS
1.0000 | ORAL_TABLET | Freq: Three times a day (TID) | ORAL | Status: DC | PRN
  Administered 2018-05-09 – 2018-05-12 (×7): 1 via ORAL
  Filled 2018-05-09 (×7): qty 1

## 2018-05-09 MED ORDER — ONDANSETRON HCL 4 MG/2ML IJ SOLN: 4.0000 mg | Freq: Four times a day (QID) | INTRAMUSCULAR | Status: DC | PRN

## 2018-05-09 MED ORDER — FENTANYL CITRATE (PF) 100 MCG/2ML IJ SOLN
INTRAMUSCULAR | Status: DC | PRN
  Administered 2018-05-09 (×3): 50 ug via INTRAVENOUS

## 2018-05-09 SURGICAL SUPPLY — 25 items
BLADE SURG 15 STRL LF DISP TIS (BLADE) ×1 IMPLANT
BLADE SURG 15 STRL SS (BLADE) ×3
BNDG GAUZE ELAST 4 BULKY (GAUZE/BANDAGES/DRESSINGS) ×3 IMPLANT
BRIEF STRETCH FOR OB PAD LRG (UNDERPADS AND DIAPERS) ×3 IMPLANT
COVER SURGICAL LIGHT HANDLE (MISCELLANEOUS) ×3 IMPLANT
DRAIN PENROSE 18X1/2 LTX STRL (DRAIN) IMPLANT
DRSG PAD ABDOMINAL 8X10 ST (GAUZE/BANDAGES/DRESSINGS) ×6 IMPLANT
ELECT PENCIL ROCKER SW 15FT (MISCELLANEOUS) ×3 IMPLANT
ELECT REM PT RETURN 15FT ADLT (MISCELLANEOUS) ×3 IMPLANT
GAUZE SPONGE 4X4 12PLY STRL (GAUZE/BANDAGES/DRESSINGS) ×3 IMPLANT
GLOVE SURG ORTHO 8.0 STRL STRW (GLOVE) ×3 IMPLANT
GOWN STRL REUS W/TWL XL LVL3 (GOWN DISPOSABLE) ×6 IMPLANT
KIT BASIN OR (CUSTOM PROCEDURE TRAY) ×3 IMPLANT
LUBRICANT JELLY K Y 4OZ (MISCELLANEOUS) ×3 IMPLANT
PACK LITHOTOMY IV (CUSTOM PROCEDURE TRAY) ×3 IMPLANT
PACKING VAGINAL (PACKING) IMPLANT
PAD ABD 8X10 STRL (GAUZE/BANDAGES/DRESSINGS) ×3 IMPLANT
SPONGE LAP 18X18 RF (DISPOSABLE) ×6 IMPLANT
SUT ETHILON 2 0 PS N (SUTURE) IMPLANT
SWAB COLLECTION DEVICE MRSA (MISCELLANEOUS) ×3 IMPLANT
SWAB CULTURE ESWAB REG 1ML (MISCELLANEOUS) IMPLANT
TOWEL OR 17X26 10 PK STRL BLUE (TOWEL DISPOSABLE) ×3 IMPLANT
TOWEL OR NON WOVEN STRL DISP B (DISPOSABLE) ×3 IMPLANT
UNDERPAD 30X30 (UNDERPADS AND DIAPERS) ×3 IMPLANT
YANKAUER SUCT BULB TIP 10FT TU (MISCELLANEOUS) ×3 IMPLANT

## 2018-05-09 NOTE — ED Provider Notes (Signed)
[facility]-EMERGENCY DEPT Provider Note   CSN: [id number] Arrival date & time: 05/09/18  1246     History   Chief Complaint Chief Complaint  Patient presents with  . Wound Infection    right buttocks    HPI [name] is a 62 y.o. male.  HPI   Patient is a 62 year old male with a history of AAA, AKI, CAD s/p CABG x4, CHF, interstitial lung disease, rheumatoid arthritis, CVA who presents emergency department today from [facility] to be evaluated for a wound infection.  Patient has had a chronic wound to the right buttock, which he states has been worsening recently.  States pain worsened about 5 days ago and he has had bloody and purulent drainage since then.  He also notes that has been malodorous for the last 5 days as well.  States he developed fevers this morning.  Denies any other symptoms including no chest pain, shortness of breath, abdominal pain, nausea or vomiting, urinary symptoms.  No cough or URI symptoms.  Denies any exacerbating or alleviating factors.  Symptoms are constant.  Began gradually and are worsening in nature.  Past Medical History:  Diagnosis Date  . AAA (abdominal aortic aneurysm) (HCC)   . AKI (acute kidney injury) (HCC)    a. Cr up to 8 in 04/2018.  . Arthritis   . CAD in native artery    a. h/o MI x 3 s/p CABG x 4 (1993 with Tyrone Sage). b. LHC 01/2017 with severe native disease, 2 grafts occluded.  . Carotid artery disease (HCC)    a. s/p R CEA 11/2016.  [name] Chronic combined systolic and diastolic CHF (congestive heart failure) (HCC)   . GERD (gastroesophageal reflux disease)   . Hyperkalemia   . ILD (interstitial lung disease) (HCC)   . Lung nodule   . MI (myocardial infarction) (HCC)    x 3   . Normocytic anemia   . NSVT (nonsustained ventricular tachycardia) (HCC)   . Pancreatitis    1/19  . Pneumonia 11/2017  . RA (rheumatoid arthritis) (HCC)   . Stroke (HCC)    a. 2018. b. possible recurrence in 04/2018, seen by  neuro  . Tobacco abuse     Patient Active Problem List   Diagnosis Date Noted  . Necrotizing fasciitis (HCC) 05/09/2018  . AKI (acute kidney injury) (HCC)   . Urinary retention   . Rheumatoid arthritis (HCC)   . Chronic obstructive pulmonary disease (HCC)   . Supplemental oxygen dependent   . Tobacco abuse   . AAA (abdominal aortic aneurysm) without rupture (HCC)   . History of CVA with residual deficit   . Acute blood loss anemia   . Hyperkalemia   . Cerebral thrombosis with cerebral infarction 04/11/2018  . Sacral ulcer, with unspecified severity (HCC) 04/05/2018  . Pressure injury of skin of sacral region   . Severe comorbid illness   . Acute renal failure (HCC)   . Shock (HCC) 04/04/2018  . High anion gap metabolic acidosis 04/04/2018  . Acute kidney injury (HCC) 04/04/2018  . Cervicalgia 01/18/2018  . Chronic pain of both shoulders 01/18/2018  . Hx of rheumatoid arthritis 01/18/2018  . Chronic pain syndrome 01/18/2018  . Leukocytosis 12/15/2017  . Cough   . Pneumonia 11/01/2017  . Elevated lipase   . Elevated liver enzymes   . Acute pancreatitis 08/08/2017  . Carotid artery disease (HCC) 05/12/2017  . 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  Chronic combined systolic and diastolic CHF (congestive heart failure) (HCC) 03/07/2017  . S/P CABG (coronary artery bypass graft)   . Pure hypercholesterolemia   . Acute combined systolic and diastolic heart failure (HCC)   . Exertional shortness of breath   . Solitary pulmonary nodule   . CAD (coronary artery disease) 02/24/2017  . ILD (interstitial lung disease) (HCC) 02/24/2017  . Normocytic anemia 02/24/2017  . AAA (abdominal aortic aneurysm) (HCC) 02/24/2017  . Abnormal EKG 02/24/2017  . Acute respiratory failure with hypoxia (HCC) 02/24/2017  . History of stroke 02/24/2017  . Essential hypertension 02/24/2017  . Current smoker 02/24/2017    Past Surgical History:  Procedure Laterality  Date  . CARDIAC CATHETERIZATION    . CAROTID ENDARTERECTOMY Left   . Heart Bypass  10/1992  . LEFT HEART CATH AND CORS/GRAFTS ANGIOGRAPHY N/A 03/01/2017   Procedure: Left Heart Cath and Cors/Grafts Angiography;  Surgeon: Lyn Records, MD;  Location: [facility] INVASIVE CV LAB;  Service: Cardiovascular;  Laterality: N/A;  . Throat Biopsy     Cat scratch fever        Home Medications    Prior to Admission medications   Medication Sig Start Date End Date Taking? Authorizing Provider  acetaminophen (TYLENOL) 500 MG tablet Take 1,000 mg by mouth every 6 (six) hours as needed for mild pain.    Yes [provider]  albuterol (PROVENTIL) (2.5 MG/3ML) 0.083% nebulizer solution Take 2.5 mg by nebulization every 6 (six) hours as needed for wheezing or shortness of breath.   Yes [provider]  atorvastatin (LIPITOR) 40 MG tablet Take 1 tablet (40 mg total) by mouth daily at 6 PM. 03/15/17  Yes Dunn, Tacey Ruiz, PA-C  Calcium Carbonate Antacid (MAALOX PO) Take 30 mLs by mouth every 4 (four) hours as needed (indigestion). And every 48 hours   Yes [provider]  carvedilol (COREG) 3.125 MG tablet Take 1 tablet (3.125 mg total) by mouth 2 (two) times daily with a meal. 04/12/18 05/12/18 Yes Zigmund Daniel., MD  clopidogrel (PLAVIX) 75 MG tablet Take 1 tablet (75 mg total) by mouth daily. 04/13/18 05/13/18 Yes Zigmund Daniel., MD  gabapentin (NEURONTIN) 300 MG capsule Take 1 capsule (300 mg total) by mouth every 8 (eight) hours. 01/18/18  Yes Edward Jolly, MD  HYDROcodone-acetaminophen (NORCO) 10-325 MG tablet Take 1 tablet by mouth 3 (three) times daily as needed for severe pain. 03/29/18  Yes Edward Jolly, MD  hydroxychloroquine (PLAQUENIL) 200 MG tablet Take 200 mg by mouth 2 (two) times daily. 10/24/17  Yes [provider]  magnesium hydroxide (MILK OF MAGNESIA) 400 MG/5ML suspension Take 30 mLs by mouth daily as needed for mild constipation. And QHS   Yes [provider]  mirtazapine (REMERON) 7.5 MG tablet Take 7.5 mg by mouth at bedtime.   Yes [provider]  nicotine (NICODERM CQ - DOSED IN MG/24 HR) 7 mg/24hr patch Place 1 patch (7 mg total) onto the skin daily. 04/13/18  Yes Zigmund Daniel., MD  polyethylene glycol [facility] Ethelene Hal) packet Take 17 g by mouth 2 (two) times daily. Until having 1 soft BM daily, then can decrease to daily or as needed 04/12/18  Yes Zigmund Daniel., MD  predniSONE (DELTASONE) 10 MG tablet Take 10 mg daily 05/04/17  Yes [provider]  PROVENTIL HFA 108 (90 Base) MCG/ACT inhaler INHALE 2 PUFFS EVERY 6 HOURS AS NEEDED FOR WHEEZE/SHORTNESS OF BREATH 03/29/18  Yes  Kalman Shan, MD  albuterol (ACCUNEB) 0.63 MG/3ML nebulizer solution Take 3 mLs by nebulization every 6 (six) hours as needed for wheezing. 10/15/17   [provider]  albuterol (ACCUNEB) 0.63 MG/3ML nebulizer solution TAKE 3 MLS (0.63 MG TOTAL) BY NEBULIZATION EVERY 6 (SIX) HOURS AS NEEDED FOR WHEEZING. Patient not taking: Reported on 05/09/2018 04/25/18   Kalman Shan, MD  aspirin 81 MG chewable tablet Chew 1 tablet (81 mg total) by mouth daily. Take for 3 weeks, then stop and take plavix only Patient not taking: Reported on 05/09/2018 04/12/18   Zigmund Daniel., MD  collagenase (SANTYL) ointment Apply topically daily. Apply Santyl to sacrum/gluteal fold areas Q day, and cover with moist fluffed gauze and foam dressing.  (Change foam dressing Q 3 days or PRN soiling.) Patient not taking: Reported on 05/09/2018 04/13/18   Zigmund Daniel., MD  Menthol-Methyl Salicylate (MUSCLE RUB EX) Apply 1 application topically as needed (for shoulder or knee pain).     [provider]  OXYGEN Inhale 2 L into the lungs at bedtime.    [provider]    Family History Family History  Problem Relation Age of Onset  . Heart murmur Mother   . Heart disease Mother   . Heart attack Mother   .  Suicidality Father   . Heart attack Father   . Stroke Brother   . Heart attack Brother   . Heart attack Maternal Grandmother   . Cancer Maternal Grandmother        unknown type  . Lung cancer Maternal Aunt   . Lung cancer Maternal Uncle   . Cancer Maternal Aunt        unknown cancer  . Heart attack Brother     Social History Social History   Tobacco Use  . Smoking status: Current Every Day Smoker    Packs/day: 0.50    Years: 47.00    Pack years: 23.50    Types: Cigarettes  . Smokeless tobacco: Never Used  . Tobacco comment: half pack every other day  Substance Use Topics  . Alcohol use: No    Comment: remote history of beer drinking  . Drug use: No     Allergies   Patient has no known allergies.   Review of Systems Review of Systems  Constitutional: Positive for chills and fever.  HENT: Negative for congestion, rhinorrhea and sore throat.   Eyes: Negative for visual disturbance.  Respiratory: Negative for cough and shortness of breath.   Cardiovascular: Negative for chest pain and leg swelling.  Gastrointestinal: Negative for abdominal pain, constipation, diarrhea, nausea and vomiting.  Genitourinary: Negative for dysuria, flank pain, frequency, hematuria and urgency.  Musculoskeletal: Negative for back pain.  Skin: Positive for wound.  Neurological: Negative for headaches.     Physical Exam Updated Vital Signs BP 105/67   Pulse 72   Temp 98.6 F (37 C)   Resp 18   Ht 6\' 1"  (1.854 m)   Wt 90.3 kg (199 lb)   SpO2 97%   BMI 26.25 kg/m   Physical Exam  Constitutional: He appears well-developed and well-nourished.  HENT:  Head: Normocephalic and atraumatic.  Eyes: Conjunctivae are normal.  Neck: Neck supple.  Cardiovascular: Normal rate, regular rhythm and normal heart sounds.  No murmur heard. Pulmonary/Chest: Effort normal. No respiratory distress.  Tachypneic in the low 20s.  Speaking in full sentences.  Crackles to the bases and midlung fields  bilaterally.  No wheezing.  Abdominal:  Soft. Bowel sounds are normal. He exhibits no distension. There is no tenderness. There is no guarding.  Musculoskeletal: He exhibits no edema.  Neurological: He is alert.  Skin: Skin is warm and dry.  1.5 cm opening to the right buttock with sero-purulent drainage and foul odor.  There is some surrounding induration.  There are also multiple areas on the buttocks with excoriation and induration.  No perirectal involvement noted on exam. Wound was probed and is at least 5cm deep.  Picture of wound below.  Psychiatric: He has a normal mood and affect.  Nursing note and vitals reviewed.     ED Treatments / Results  Labs (all labs ordered are listed, but only abnormal results are displayed) Labs Reviewed  COMPREHENSIVE METABOLIC PANEL - Abnormal; Notable for the following components:      Result Value   Sodium 133 (*)    Glucose, Bld 178 (*)    Calcium 8.0 (*)    Albumin 2.2 (*)    Alkaline Phosphatase 127 (*)    All other components within normal limits  CBC WITH DIFFERENTIAL/PLATELET - Abnormal; Notable for the following components:   RBC 4.03 (*)    Hemoglobin 11.0 (*)    HCT 34.7 (*)    RDW 15.8 (*)    Neutro Abs 7.8 (*)    All other components within normal limits  BRAIN NATRIURETIC PEPTIDE - Abnormal; Notable for the following components:   B Natriuretic Peptide 232.1 (*)    All other components within normal limits  I-STAT CG4 LACTIC ACID, ED - Abnormal; Notable for the following components:   Lactic Acid, Venous 2.54 (*)    All other components within normal limits  I-STAT CG4 LACTIC ACID, ED - Abnormal; Notable for the following components:   Lactic Acid, Venous 2.28 (*)    All other components within normal limits  CULTURE, BLOOD (ROUTINE X 2)  CULTURE, BLOOD (ROUTINE X 2)  URINE CULTURE  AEROBIC/ANAEROBIC CULTURE (SURGICAL/DEEP WOUND)  MRSA PCR SCREENING  PROTIME-INR  URINALYSIS, ROUTINE W REFLEX MICROSCOPIC  CBC  BASIC  METABOLIC PANEL  I-STAT TROPONIN, ED    EKG None  Radiology Dg Chest 2 View  Result Date: 05/09/2018 CLINICAL DATA:  Fever. Buttock wound. History of interstitial lung disease. EXAM: CHEST - 2 VIEW COMPARISON:  Chest radiograph April 06, 2018 FINDINGS: Cardiac silhouette is moderately enlarged unchanged. Status post median sternotomy for CABG with multiple fractured sternotomy wires. Similar chronic interstitial changes without pleural effusion or focal consolidation. No pneumothorax. Old LEFT rib fractures. IMPRESSION: 1. Stable interstitial prominence consistent with history of interstitial lung disease. 2. Stable cardiomegaly. Electronically Signed   By: Awilda Metro M.D.   On: 05/09/2018 14:46   Ct Pelvis W Contrast  Result Date: 05/09/2018 CLINICAL DATA:  Buttock pain and fever. Sepsis muscle likely from buttock/perineal and cellulitis. Concern for possible necrotizing fasciitis. EXAM: CT PELVIS WITH CONTRAST TECHNIQUE: Multidetector CT imaging of the pelvis was performed using the standard protocol following the bolus administration of intravenous contrast. CONTRAST:  ISOVUE-300 IOPAMIDOL (ISOVUE-300) INJECTION 61% COMPARISON:  08/08/2017 CT FINDINGS: Urinary Tract: Physiologic distention of the urinary bladder is noted. The distal ureters are unremarkable. Bowel: Normal appearing included appendix. Scattered colonic diverticulosis along the distal descending and sigmoid colon without acute diverticulitis. Mechanical bowel obstruction or inflammation. 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 Atherosclerosis with ectasia of the left common iliac artery 1.5 cm. No inguinal, pelvic sidewall, retroperitoneal or iliac chain adenopathy.  Reproductive:  Normal size prostate and seminal vesicles. Other:  No free air nor free fluid. Musculoskeletal: Cellulitis of the medial right buttock with mottled peripherally enhancing soft tissue abscess or  stigmata necrotizing fasciitis just caudad to the right ischium in the expected location of the distal right semimembranosus, semitendinosus, rectus femoris and quadratus femoris muscles and tendons. This collection contains mottled foci of gas and is estimated at 5.9 x 4.8 cm on series 3/130. The craniocaudad extent is difficult to ascertain on the reformats as it is incompletely included. Cortical bone loss is noted of the right ischial tuberosity is concerning for changes of osteomyelitis. IMPRESSION: 1. Abnormal enhancing soft tissue collection centered just caudad to the right ischial tuberosity measuring at least 5.9 x 4.7 cm hand AP by transverse dimension and involving the distal right semimembranosus, semitendinosus, rectus femoris and quadratus femoris muscles and tendons. This raises concern for soft tissue abscess and/or stigmata of necrotizing fasciitis. 2. Cortical bone loss of the right ischial tuberosity also raises concern changes of osteomyelitis. 3. 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 4. Ectasia of the left common iliac artery to 1.5 cm. Electronically Signed   By: Tollie Eth M.D.   On: 05/09/2018 15:59    Procedures Procedures (including critical care time) CRITICAL CARE Performed by: Karrie Meres   Total critical care time: 33 minutes  Critical care time was exclusive of separately billable procedures and treating other patients.  Critical care was necessary to treat or prevent imminent or life-threatening deterioration.  Critical care was time spent personally by me on the following activities: development of treatment plan with patient and/or surrogate as well as nursing, discussions with consultants, evaluation of patient's response to treatment, examination of patient, obtaining history from patient or surrogate, ordering and performing treatments and interventions, ordering and review of laboratory studies, ordering and review of  radiographic studies, pulse oximetry and re-evaluation of patient's condition.   Medications Ordered in ED Medications  iopamidol (ISOVUE-300) 61 % injection (has no administration in time range)  hydrocortisone sodium succinate (SOLU-CORTEF) 100 MG injection 50 mg (50 mg Intravenous Given 05/09/18 2327)  clindamycin (CLEOCIN) IVPB 600 mg (0 mg Intravenous Stopped 05/10/18 0040)  gabapentin (NEURONTIN) capsule 300 mg (300 mg Oral Given 05/09/18 2210)  HYDROcodone-acetaminophen (NORCO) 10-325 MG per tablet 1 tablet (1 tablet Oral Given 05/09/18 2318)  nicotine (NICODERM CQ - dosed in mg/24 hr) patch 7 mg (has no administration in time range)  0.9 %  sodium chloride infusion (has no administration in time range)  ipratropium-albuterol (DUONEB) 0.5-2.5 (3) MG/3ML nebulizer solution 3 mL (3 mLs Nebulization Given 05/09/18 2241)  piperacillin-tazobactam (ZOSYN) IVPB 3.375 g ( Intravenous Rate/Dose Change 05/10/18 0100)  vancomycin (VANCOCIN) 1,250 mg in sodium chloride 0.9 % 250 mL IVPB ( Intravenous MAR Unhold 05/09/18 2156)  dextrose 5 % and 0.45 % NaCl with KCl 20 mEq/L infusion ( Intravenous Rate/Dose Verify 05/10/18 0100)  HYDROmorphone (DILAUDID) injection 1 mg (has no administration in time range)  enoxaparin (LOVENOX) injection 40 mg (has no administration in time range)  HYDROcodone-acetaminophen (NORCO/VICODIN) 5-325 MG per tablet 1-2 tablet (has no administration in time range)  acetaminophen (TYLENOL) tablet 650 mg (has no administration in time range)    Or  acetaminophen (TYLENOL) suppository 650 mg (has no administration in time range)  ondansetron (ZOFRAN-ODT) disintegrating tablet 4 mg (has no administration in time range)    Or  ondansetron (ZOFRAN) injection 4 mg (has no administration in time range)  traMADol (ULTRAM) tablet 50 mg (has no administration in time range)  sodium chloride 0.9 % bolus 1,000 mL (0 mLs Intravenous Stopped 05/09/18 1620)  piperacillin-tazobactam (ZOSYN) IVPB 3.375 g  (0 g Intravenous Stopped 05/09/18 1542)  acetaminophen (TYLENOL) tablet 650 mg (650 mg Oral Given 05/09/18 1450)  vancomycin (VANCOCIN) 2,000 mg in sodium chloride 0.9 % 500 mL IVPB (0 mg Intravenous Stopped 05/09/18 1750)  morphine 4 MG/ML injection 4 mg (4 mg Intravenous Given 05/09/18 1450)  iopamidol (ISOVUE-300) 61 % injection 100 mL (100 mLs Intravenous Contrast Given 05/09/18 1522)  clindamycin (CLEOCIN) IVPB 600 mg (0 mg Intravenous Stopped 05/09/18 1657)  hydrocortisone sodium succinate (SOLU-CORTEF) 100 MG injection 100 mg (100 mg Intravenous Given 05/09/18 1628)  sodium chloride 0.9 % bolus 1,000 mL (0 mLs Intravenous Stopped 05/09/18 1658)  sodium chloride 0.9 % bolus 1,000 mL (1,000 mLs Intravenous New Bag/Given 05/09/18 1750)     Initial Impression / Assessment and Plan / ED Course  I have reviewed the triage vital signs and the nursing notes.  Pertinent labs & imaging results that were available during my care of the patient were reviewed by me and considered in my medical decision making (see chart for details).    Discussed pt presentation and exam findings with Dr. Juleen China, who personally evaluated the patient and agrees with the plan to order IV abx. Agrees with plan to order 1L fluids initially given he has crackles on pulmonary exam and has a h/o CHF.  1500 Re-evaluated pt. He feels improved after pain medication. BP >100 systolic. Satting well on RA. NAD. HR WNL. IVF running.  Final Clinical Impressions(s) / ED Diagnoses   Final diagnoses:  Severe sepsis (HCC)  Necrotizing fasciitis [facility])   Patient presenting the ED for wound infection to the right buttock with seropurulent drainage and foul odor, tracks 5cm deep.  He is febrile to 101.72F, mildly tachypneic with borderline blood pressures in the 100s systolic.  Code sepsis was initiated. antibiotics and fluids ordered.  Will order CT of the pelvis with contrast to further evaluate the wound.  There is concern for necrotizing fasciitis  based on exam.   Patient initial lactic acid was elevated at 2.54, repeat lactic elevated at 2.28. CBC without leukocytosis.  Mild anemia appears stable.  CMP with mild hyponatremia to 133, normal kidney function. BNP mildly elevated. UA negative.Blood cultures and urine culture pending.    His troponin is elevated at 0.08.   CXR stable. No obvious consolidation to suggest pneumonia.   CT concerning for necrotizing fasciitis with possible osteomyelitis. Clindamycin was ordered.  general surgery was consulted who will see the patient. Critical care was consulted and will admit the patient for tx of severe sepsis due to necrotizing fasciitis with osteomyleitis.   ED Discharge Orders    None       Karrie Meres, [place] 05/10/18 0143    Raeford Razor, MD 05/10/18 [phone]

## 2018-05-09 NOTE — ED Notes (Signed)
Bed: IO96 Expected date:  Expected time:  Means of arrival:  Comments: EMS wound

## 2018-05-09 NOTE — Brief Op Note (Signed)
05/09/2018  9:07 PM  PATIENT:  [name]  62 y.o. male  PRE-OPERATIVE DIAGNOSIS:  perineal infection necrotizing fasciitis  POST-OPERATIVE DIAGNOSIS:  RIGHT BUTTOCK ABCESS  PROCEDURE:  Procedure(s): IRRIGATION AND DEBRIDEMENT  RIGHT BUTTOCK ABSCESS (Right) - skin, subcutaneous, muscle, fascia - 30 x 15 x 8 cm wound following debridement  SURGEON:  Surgeon(s) and Role:    Darnell Level, MD - Primary  ANESTHESIA:   general  EBL:  25 mL   BLOOD ADMINISTERED:none  DRAINS: none   LOCAL MEDICATIONS USED:  NONE  SPECIMEN:  Source of Specimen:  cultures to lab from wound / abscess  DISPOSITION OF SPECIMEN:  PATHOLOGY  COUNTS:  YES  TOURNIQUET:  * No tourniquets in log *  DICTATION: .Other Dictation: Dictation Number [id number]  PLAN OF CARE: Admit to inpatient   PATIENT DISPOSITION:  PACU - hemodynamically stable.   Delay start of Pharmacological VTE agent (>24hrs) due to surgical blood loss or risk of bleeding: yes  Darnell Level, MD [facility] Surgery Office: [phone]

## 2018-05-09 NOTE — Anesthesia Procedure Notes (Signed)
Procedure Name: Intubation Performed by: Gean Maidens, CRNA Pre-anesthesia Checklist: Patient identified, Emergency Drugs available, Suction available, Patient being monitored and Timeout performed Patient Re-evaluated:Patient Re-evaluated prior to induction Oxygen Delivery Method: Circle system utilized Preoxygenation: Pre-oxygenation with 100% oxygen Induction Type: IV induction Ventilation: Mask ventilation without difficulty Laryngoscope Size: Mac and 4 Grade View: Grade I Tube type: Oral Tube size: 7.5 mm Number of attempts: 1 Airway Equipment and Method: Stylet Placement Confirmation: ETT inserted through vocal cords under direct vision,  positive ETCO2,  CO2 detector and breath sounds checked- equal and bilateral Secured at: 23 cm Tube secured with: Tape Dental Injury: Teeth and Oropharynx as per pre-operative assessment

## 2018-05-09 NOTE — Anesthesia Preprocedure Evaluation (Signed)
Anesthesia Evaluation  Patient identified by MRN, date of birth, ID band Patient awake    Reviewed: Allergy & Precautions, NPO status , Patient's Chart, lab work & pertinent test results  Airway Mallampati: I  TM Distance: >3 FB Neck ROM: Full    Dental   Pulmonary Current Smoker,    Pulmonary exam normal        Cardiovascular hypertension, Pt. on medications + CAD, + Past MI and + CABG  Normal cardiovascular exam     Neuro/Psych CVA    GI/Hepatic GERD  Medicated and Controlled,  Endo/Other    Renal/GU      Musculoskeletal   Abdominal   Peds  Hematology   Anesthesia Other Findings   Reproductive/Obstetrics                             Anesthesia Physical Anesthesia Plan  ASA: III  Anesthesia Plan: General   Post-op Pain Management:    Induction: Intravenous  PONV Risk Score and Plan: 1 and Ondansetron, Midazolam and Treatment may vary due to age or medical condition  Airway Management Planned: Oral ETT  Additional Equipment:   Intra-op Plan:   Post-operative Plan: Extubation in OR  Informed Consent: I have reviewed the patients History and Physical, chart, labs and discussed the procedure including the risks, benefits and alternatives for the proposed anesthesia with the patient or authorized representative who has indicated his/her understanding and acceptance.     Plan Discussed with: CRNA and Surgeon  Anesthesia Plan Comments:         Anesthesia Quick Evaluation

## 2018-05-09 NOTE — ED Notes (Signed)
Bed: WLPT3 Expected date:  Expected time:  Means of arrival:  Comments: 

## 2018-05-09 NOTE — Transfer of Care (Signed)
Immediate Anesthesia Transfer of Care Note  Patient: [name]  Procedure(s) Performed: IRRIGATION AND DEBRIDEMENT  RIGHT BUTTOCK ABSCESS (Right Buttocks)  Patient Location: PACU  Anesthesia Type:General  Level of Consciousness: awake, alert  and oriented  Airway & Oxygen Therapy: Patient Spontanous Breathing and Patient connected to face mask oxygen  Post-op Assessment: Report given to RN and Post -op Vital signs reviewed and stable  Post vital signs: Reviewed and stable  Last Vitals:  Vitals Value Taken Time  BP 114/88 05/09/2018  9:22 PM  Temp    Pulse 82 05/09/2018  9:24 PM  Resp 33 05/09/2018  9:24 PM  SpO2 92 % 05/09/2018  9:24 PM  Vitals shown include unvalidated device data.  Last Pain:  Vitals:   05/09/18 1640  TempSrc: Oral  PainSc:          Complications: No apparent anesthesia complications

## 2018-05-09 NOTE — Progress Notes (Signed)
Pharmacy Antibiotic Note  [name] is a 62 y.o. male admitted on 05/09/2018 with infected right buttock wound concerning for necrotizing infection.  Pharmacy has been consulted for vancomycin and zosyn dosing.  Plan: Zosyn 3.375g IV q8h (4 hour infusion).  Vancomycin 2 gm loading dose given in ED at 1542 then vancomycin 1250 mg IV q12 for AUC 452 using SCr 0.8, IBW/TBW F/u renal fxn, WBC, temp, culture data Vancomycin levels as needed  Weight: 199 lb (90.3 kg)  Temp (24hrs), Avg:99.7 F (37.6 C), Min:97.9 F (36.6 C), Max:101.4 F (38.6 C)  Recent Labs  Lab 05/09/18 1329 05/09/18 1332 05/09/18 1457  WBC  --  9.4  --   CREATININE  --  0.77  --   LATICACIDVEN 2.54*  --  2.28*    Estimated Creatinine Clearance: 108.2 mL/min (by C-G formula based on SCr of 0.77 mg/dL).    No Known Allergies  Antimicrobials this admission: 7/8 vanc>> 7/8 zosyn >> 7/8 clinda 600 x 1  Dose adjustments this admission:  Microbiology results: 7/8 Ucx>> 7/8 BCx2>>  Thank you for allowing pharmacy to be a part of this patient's care.  Herby Abraham, Pharm.D. [phone] 05/09/2018 5:16 PM

## 2018-05-09 NOTE — Anesthesia Postprocedure Evaluation (Signed)
Anesthesia Post Note  Patient: [name]  Procedure(s) Performed: IRRIGATION AND DEBRIDEMENT  RIGHT BUTTOCK ABSCESS (Right Buttocks)     Patient location during evaluation: PACU Anesthesia Type: General Level of consciousness: awake and alert Pain management: pain level controlled Vital Signs Assessment: post-procedure vital signs reviewed and stable Respiratory status: spontaneous breathing, nonlabored ventilation, respiratory function stable and patient connected to nasal cannula oxygen Cardiovascular status: blood pressure returned to baseline and stable Postop Assessment: no apparent nausea or vomiting Anesthetic complications: no    Last Vitals:  Vitals:   05/09/18 1634 05/09/18 1640  BP: (!) 110/57   Pulse: 75   Resp: (!) 23   Temp:  36.6 C  SpO2: 94%     Last Pain:  Vitals:   05/09/18 1640  TempSrc: Oral  PainSc:                  Irais Mottram DAVID

## 2018-05-09 NOTE — ED Provider Notes (Addendum)
Medical screening examination/treatment/procedure(s) were conducted as a shared visit with non-physician practitioner(s) and myself.  I personally evaluated the patient during the encounter.  None  61yM with buttock pain and fever. Septic, most likely from buttock/perineum wound/cellulits. Concern for possible necrotizing fasciitis. Foul odor. Grey drainage. Mottled appearance of buttocks.  Will CT. Empiric abx. Will add clindamycin if CT is suggestive of nec fasc.  Admit.    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 Surgery consulted. Clindamycin ordered. Chronically on steroids. Stress dose hydrocortisone. Foley placed for wound care reasons and to monitor I/Os.   His BP is currently soft but systolic in 100s. Lactic acid only mildly elevated at 2.5. At very high risk for deterioration. Will likely need OR and may stay intubated in anticipation of further debridement. Discussed with CCM.    CRITICAL CARE Performed by: Raeford Razor Total critical care time: 76 minutes Critical care time was exclusive of separately billable procedures and treating other patients. Critical care was necessary to treat or prevent imminent or life-threatening deterioration. Critical care was time spent personally by me on the following activities: development of treatment plan with patient and/or surrogate as well as nursing, discussions with consultants, evaluation of patient's response to treatment, examination of patient, obtaining history from patient or surrogate, ordering and performing treatments and interventions, ordering and review of laboratory studies, ordering and review of radiographic studies, pulse oximetry and re-evaluation of patient's condition.     Raeford Razor, MD 05/09/18 1630

## 2018-05-09 NOTE — Progress Notes (Signed)
A consult was received from an ED provider for Vancomycin and Zosyn per pharmacy dosing.  The patient's profile has been reviewed for ht/wt/allergies/indication/available labs.    A one time order has been placed for Vancomycin 2g IV and Zosyn 3.375g IV (30 minute infusion).  Further antibiotics/pharmacy consults should be ordered by admitting physician if indicated.                       Thank you, Jamse Mead 05/09/2018  2:27 PM

## 2018-05-09 NOTE — H&P (Signed)
PULMONARY / CRITICAL CARE MEDICINE   Name: [name] MRN: [id number] DOB: Jul 12, 1956    ADMISSION DATE:  05/09/2018 CONSULTATION DATE: May 09, 2018  REFERRING MD: Dr. Algie Coffer  CHIEF COMPLAINT: Wound pain  HISTORY OF PRESENT ILLNESS:   This is a 62 year old male well-known to the [facility] pulmonary critical care medicine service because of his known COPD interstitial lung disease and connective tissue disease with chronic immunosuppression who returns to our facility complaining of right sided buttock pain.  He has had a wound which is being received dressing at his nursing facility for the last 2 weeks.  He says it has been bleeding intermittently and they have been giving him wound dressing changes there.  Today it bled more so they sent him here for further evaluation.  He had a CT scan which showed evidence of necrotizing skin infection so pulmonary and critical care medicine was asked to admit and surgery was consulted.  The patient says that he denies any shortness of breath, nausea or vomiting.  He had a normal breakfast this morning.  PAST MEDICAL HISTORY :  He  has a past medical history of AAA (abdominal aortic aneurysm) (HCC), AKI (acute kidney injury) (HCC), Arthritis, CAD in native artery, Carotid artery disease (HCC), Chronic combined systolic and diastolic CHF (congestive heart failure) (HCC), GERD (gastroesophageal reflux disease), Hyperkalemia, ILD (interstitial lung disease) (HCC), Lung nodule, MI (myocardial infarction) (HCC), Normocytic anemia, NSVT (nonsustained ventricular tachycardia) (HCC), Pancreatitis, Pneumonia (11/2017), RA (rheumatoid arthritis) (HCC), Stroke (HCC), and Tobacco abuse.  PAST SURGICAL HISTORY: He  has a past surgical history that includes Cardiac catheterization; LEFT HEART CATH AND CORS/GRAFTS ANGIOGRAPHY (N/A, 03/01/2017); Carotid endarterectomy (Left); Heart Bypass (10/1992); and Throat Biopsy.  No Known Allergies  No current  facility-administered medications on file prior to encounter.    Current Outpatient Medications on File Prior to Encounter  Medication Sig  . acetaminophen (TYLENOL) 500 MG tablet Take 1,000 mg by mouth every 6 (six) hours as needed for mild pain.   [name] albuterol (ACCUNEB) 0.63 MG/3ML nebulizer solution Take 3 mLs by nebulization every 6 (six) hours as needed for wheezing.  [name] aspirin 81 MG chewable tablet Chew 1 tablet (81 mg total) by mouth daily. Take for 3 weeks, then stop and take plavix only  . atorvastatin (LIPITOR) 40 MG tablet Take 1 tablet (40 mg total) by mouth daily at 6 PM.  . carvedilol (COREG) 3.125 MG tablet Take 1 tablet (3.125 mg total) by mouth 2 (two) times daily with a meal.  . clopidogrel (PLAVIX) 75 MG tablet Take 1 tablet (75 mg total) by mouth daily.  [name] gabapentin (NEURONTIN) 300 MG capsule Take 1 capsule (300 mg total) by mouth every 8 (eight) hours.  [name] HYDROcodone-acetaminophen (NORCO) 10-325 MG tablet Take 1 tablet by mouth 3 (three) times daily as needed for severe pain.  . hydroxychloroquine (PLAQUENIL) 200 MG tablet Take 200 mg by mouth 2 (two) times daily.  . Menthol-Methyl Salicylate (MUSCLE RUB EX) Apply 1 application topically as needed (for shoulder or knee pain).   . nicotine (NICODERM CQ - DOSED IN MG/24 HR) 7 mg/24hr patch Place 1 patch (7 mg total) onto the skin daily.  . OXYGEN Inhale 2 L into the lungs at bedtime.  . polyethylene glycol (MIRALAX / GLYCOLAX) packet Take 17 g by mouth 2 (two) times daily. Until having 1 soft BM daily, then can decrease to daily or as needed  . predniSONE (DELTASONE) 10 MG tablet Take 10 mg daily  .  PROVENTIL HFA 108 (90 Base) MCG/ACT inhaler INHALE 2 PUFFS EVERY 6 HOURS AS NEEDED FOR WHEEZE/SHORTNESS OF BREATH  . albuterol (ACCUNEB) 0.63 MG/3ML nebulizer solution TAKE 3 MLS (0.63 MG TOTAL) BY NEBULIZATION EVERY 6 (SIX) HOURS AS NEEDED FOR WHEEZING.  . collagenase (SANTYL) ointment Apply topically daily. Apply Santyl to  sacrum/gluteal fold areas Q day, and cover with moist fluffed gauze and foam dressing.  (Change foam dressing Q 3 days or PRN soiling.)    FAMILY HISTORY:  His indicated that his mother is deceased. He indicated that his father is deceased. He indicated that his maternal grandmother is deceased. He indicated that his maternal grandfather is deceased. He indicated that his paternal grandmother is deceased. He indicated that his paternal grandfather is deceased. He indicated that his son is alive. He indicated that the status of his maternal uncle is unknown.   SOCIAL HISTORY: He  reports that he has been smoking cigarettes.  He has a 23.50 pack-year smoking history. He has never used smokeless tobacco. He reports that he does not drink alcohol or use drugs.  REVIEW OF SYSTEMS:   Gen: per HPI HEENT: Denies blurred vision, double vision, hearing loss, tinnitus, sinus congestion, rhinorrhea, sore throat, neck stiffness, dysphagia PULM: Denies shortness of breath, cough, sputum production, hemoptysis, wheezing CV: Denies chest pain, edema, orthopnea, paroxysmal nocturnal dyspnea, palpitations GI: Denies abdominal pain, nausea, vomiting, diarrhea, hematochezia, melena, constipation, change in bowel habits GU: Denies dysuria, hematuria, polyuria, oliguria, urethral discharge Endocrine: Denies hot or cold intolerance, polyuria, polyphagia or appetite change Derm: Denies rash, dry skin, scaling or peeling skin change Heme: Denies easy bruising, bleeding, bleeding gums Neuro: Denies headache, numbness, weakness, slurred speech, loss of memory or consciousness   SUBJECTIVE:  As above  VITAL SIGNS: BP (!) 110/57 (BP Location: Right Arm)   Pulse 75   Temp 97.9 F (36.6 C) (Oral)   Resp (!) 23   Wt 199 lb (90.3 kg)   SpO2 94%   BMI 26.25 kg/m   HEMODYNAMICS:    VENTILATOR SETTINGS:    INTAKE / OUTPUT: No intake/output data recorded.  PHYSICAL EXAMINATION:  General:  Resting  comfortably in bed HENT: NCAT OP clear PULM: CTA B, normal effort CV: RRR, no mgr GI: BS+, soft, nontender Derm: R buttock with ulcerated wound, some tenderness and redness, drains brown pus, separate area of fluctuance Neuro: awake, alert, no distress, MAEW   LABS:  BMET Recent Labs  Lab 05/09/18 1332  NA 133*  K 4.0  CL 99  CO2 24  BUN 19  CREATININE 0.77  GLUCOSE 178*    Electrolytes Recent Labs  Lab 05/09/18 1332  CALCIUM 8.0*    CBC Recent Labs  Lab 05/09/18 1332  WBC 9.4  HGB 11.0*  HCT 34.7*  PLT 167    Coag's Recent Labs  Lab 05/09/18 1332  INR 1.08    Sepsis Markers Recent Labs  Lab 05/09/18 1329 05/09/18 1457  LATICACIDVEN 2.54* 2.28*    ABG No results for input(s): PHART, PCO2ART, PO2ART in the last 168 hours.  Liver Enzymes Recent Labs  Lab 05/09/18 1332  AST 30  ALT 29  ALKPHOS 127*  BILITOT 0.8  ALBUMIN 2.2*    Cardiac Enzymes No results for input(s): TROPONINI, PROBNP in the last 168 hours.  Glucose No results for input(s): GLUCAP in the last 168 hours.  Imaging Dg Chest 2 View  Result Date: 05/09/2018 CLINICAL DATA:  Fever. Buttock wound. History of interstitial lung disease. EXAM:  CHEST - 2 VIEW COMPARISON:  Chest radiograph April 06, 2018 FINDINGS: Cardiac silhouette is moderately enlarged unchanged. Status post median sternotomy for CABG with multiple fractured sternotomy wires. Similar chronic interstitial changes without pleural effusion or focal consolidation. No pneumothorax. Old LEFT rib fractures. IMPRESSION: 1. Stable interstitial prominence consistent with history of interstitial lung disease. 2. Stable cardiomegaly. Electronically Signed   By: Awilda Metro M.D.   On: 05/09/2018 14:46   Ct Pelvis W Contrast  Result Date: 05/09/2018 CLINICAL DATA:  Buttock pain and fever. Sepsis muscle likely from buttock/perineal and cellulitis. Concern for possible necrotizing fasciitis. EXAM: CT PELVIS WITH CONTRAST  TECHNIQUE: Multidetector CT imaging of the pelvis was performed using the standard protocol following the bolus administration of intravenous contrast. CONTRAST:  ISOVUE-300 IOPAMIDOL (ISOVUE-300) INJECTION 61% COMPARISON:  08/08/2017 CT FINDINGS: Urinary Tract: Physiologic distention of the urinary bladder is noted. The distal ureters are unremarkable. Bowel: Normal appearing included appendix. Scattered colonic diverticulosis along the distal descending and sigmoid colon without acute diverticulitis. Mechanical bowel obstruction or inflammation. 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 Atherosclerosis with ectasia of the left common iliac artery 1.5 cm. No inguinal, pelvic sidewall, retroperitoneal or iliac chain adenopathy. Reproductive:  Normal size prostate and seminal vesicles. Other:  No free air nor free fluid. Musculoskeletal: Cellulitis of the medial right buttock with mottled peripherally enhancing soft tissue abscess or stigmata necrotizing fasciitis just caudad to the right ischium in the expected location of the distal right semimembranosus, semitendinosus, rectus femoris and quadratus femoris muscles and tendons. This collection contains mottled foci of gas and is estimated at 5.9 x 4.8 cm on series 3/130. The craniocaudad extent is difficult to ascertain on the reformats as it is incompletely included. Cortical bone loss is noted of the right ischial tuberosity is concerning for changes of osteomyelitis. IMPRESSION: 1. Abnormal enhancing soft tissue collection centered just caudad to the right ischial tuberosity measuring at least 5.9 x 4.7 cm hand AP by transverse dimension and involving the distal right semimembranosus, semitendinosus, rectus femoris and quadratus femoris muscles and tendons. This raises concern for soft tissue abscess and/or stigmata of necrotizing fasciitis. 2. Cortical bone loss of the right ischial tuberosity also raises  concern changes of osteomyelitis. 3. 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 4. Ectasia of the left common iliac artery to 1.5 cm. Electronically Signed   By: Tollie Eth M.D.   On: 05/09/2018 15:59     STUDIES:  May 09, 2018 CT pelvis showed abnormal soft tissue collection right ischial tuberosity me concerning for necrotizing infection, other findings as detailed in radiology report  CULTURES: July 8 blood culture July 8 urine culture  ANTIBIOTICS: July 8 vancomycin July 8 Zosyn July 8 clindamycin  SIGNIFICANT EVENTS:   LINES/TUBES:   DISCUSSION: 62 year old male chronically immunosuppressed with COPD and interstitial lung disease presents with an infected right buttock wound concerning for necrotizing infection.  ASSESSMENT / PLAN:  PULMONARY A: COPD Interstitial lung disease P:   Hold immunosuppression Monitor O2 saturation in ICU setting DuoNeb scheduled  CARDIOVASCULAR A:  Hypertension, at risk for developing severe sepsis P:  Telemetry monitoring Bolus liter of saline now Continue stress dose steroids LR at 75 cc an hour  RENAL A:   No acute issues P:   Monitor BMET and UOP Replace electrolytes as needed   GASTROINTESTINAL A:   No acute issues  P:   n.p.o. for surgery  HEMATOLOGIC A:  Chronic anemia no bleeding P:  Monitor for bleeding Transfuse for hemoglobin  less than 7 g/dL Hold off on medical DVT prophylaxis for now as he needs to get a surgery, use SCDs  INFECTIOUS A:   Necrotizing skin infection  P:   Continue vancomycin, Zosyn, clindamycin Wound care post surgery To surgery this evening  ENDOCRINE A:   Relative adrenal insufficiency P:   Hydrocortisone IV 50 every 6 hours  NEUROLOGIC A:   No acute issues P:   Prn pan control   FAMILY  - Updates: none bedside   My cc time 35 minutes  Heber [place], MD [facility] Pager: [phone] Cell: [phone] After 3pm or if no  response, call [phone]   05/09/2018, 5:00 PM

## 2018-05-09 NOTE — ED Notes (Signed)
Provider aware of critical Troponin value 0.08

## 2018-05-09 NOTE — ED Notes (Signed)
Dr. Juleen China made aware patient has critical lactic value of 2.28

## 2018-05-09 NOTE — Consult Note (Addendum)
Reason for Consult: Necrotizing fasciitis Referring Physician: Dr. Virgel Manifold (Emergency department)  [name] is an 62 y.o. male.  HPI: Patient is a 62 year old male who was just discharged on 04/12/2018, with a diagnosis of shock secondary to hypovolemia, and adrenal insufficiency.  He has multiple complications including a subacute left basilar ganglial stroke.    He had a stage II buttocks ulcer at the time of discharge, and was transferred to a skilled nursing facility.  He was transported from [facility] facility today with a 1.5 inch malodorous wound that was reported bleeding for the last 3 days.  Patient reported increasing discomfort over the last 5 days and developed fever this a.m..  Work-up in the ED shows he has a fever of 101.4, respiratory rate is in the 20-30 range.  Blood pressures 103/74.  Sats are 94% on room air.  CBC on admission shows a WBC of 9.4, hemoglobin of 11, hematocrit of 34.7, platelets are 167,000.  Admit CMP shows a sodium of 133, glucose of 178, calcium of 8, alk phos of 127, albumin of 2.2.  BNP is 232  Admit  lactate is 2.54, a repeat is 2.28.  Admission chest x-ray shows stable interstitial changes without pleural effusion or consolidation consistent with a history of interstitial lung disease.  Stable cardiomegaly with prior median sternotomy and multiple fractured sternotomy wires.  CT of the abdomen pelvis with contrast: Shows an abnormal enhancing soft tissue collection just caudal to the right ischial tuberosity measuring 5.9 x 4.7 cm.  This involves the distal right semimembranosus, semitendinosus, rectal femoris and quadratus femoris muscles and tendons. This raises concern for soft tissue abscess and/or stigmata of necrotizing fasciitis.  Cortical bone loss of the right ischial tuberosity also raises concern changes of osteomyelitis.  There is also aneurysmal dilatation at the aortic bifurcation measuring 3.4 cm, and ectasia of the left  common iliac artery.  Past Medical History:  Diagnosis Date  . AAA (abdominal aortic aneurysm) ([place])   . AKI (acute kidney injury) ([place])    a. Cr up to 8 in 04/2018.  . Arthritis   . CAD in native artery    a. h/o MI x 3 s/p CABG x 4 (1993 with [facility] Snare). b. LHC 01/2017 with severe native disease, 2 grafts occluded.  . Carotid artery disease ([place])    a. s/p R CEA 11/2016.  [name] Chronic combined systolic and diastolic CHF (congestive heart failure) ([place])   . GERD (gastroesophageal reflux disease)   . Hyperkalemia   . ILD (interstitial lung disease) ([place])   . Lung nodule   . MI (myocardial infarction) ([place])    x 3   . Normocytic anemia   . NSVT (nonsustained ventricular tachycardia) ([place])   . Pancreatitis    1/19  . Pneumonia 11/2017  . RA (rheumatoid arthritis) ([place])   . Stroke ([place])    a. 2018. b. possible recurrence in 04/2018, seen by neuro  . Tobacco abuse     Past Surgical History:  Procedure Laterality Date  . CARDIAC CATHETERIZATION    . CAROTID ENDARTERECTOMY Left   . Heart Bypass  10/1992  . LEFT HEART CATH AND CORS/GRAFTS ANGIOGRAPHY N/A 03/01/2017   Procedure: Left Heart Cath and Cors/Grafts Angiography;  Surgeon: Belva Crome, MD;  Location: [facility];  Service: Cardiovascular;  Laterality: N/A;  . Throat Biopsy     Cat scratch fever    Family History  Problem Relation Age of Onset  .  Heart murmur Mother   . Heart disease Mother   . Heart attack Mother   . Suicidality Father   . Heart attack Father   . Stroke Brother   . Heart attack Brother   . Heart attack Maternal Grandmother   . Cancer Maternal Grandmother        unknown type  . Lung cancer Maternal Aunt   . Lung cancer Maternal Uncle   . Cancer Maternal Aunt        unknown cancer  . Heart attack Brother     Social History:  reports that he has been smoking cigarettes.  He has a 23.50 pack-year smoking history. He has never used smokeless tobacco. He reports that he does not drink alcohol  or use drugs.  Allergies: No Known Allergies  . Prior to Admission medications   Medication Sig Start Date End Date Taking? Authorizing Provider  acetaminophen (TYLENOL) 500 MG tablet Take 1,000 mg by mouth every 6 (six) hours as needed for mild pain.    Yes [provider]  albuterol (ACCUNEB) 0.63 MG/3ML nebulizer solution Take 3 mLs by nebulization every 6 (six) hours as needed for wheezing. 10/15/17  Yes [provider]  aspirin 81 MG chewable tablet Chew 1 tablet (81 mg total) by mouth daily. Take for 3 weeks, then stop and take plavix only 04/12/18  Yes Elodia Florence., MD  atorvastatin (LIPITOR) 40 MG tablet Take 1 tablet (40 mg total) by mouth daily at 6 PM. 03/15/17  Yes Dunn, Dayna N, PA-C  carvedilol (COREG) 3.125 MG tablet Take 1 tablet (3.125 mg total) by mouth 2 (two) times daily with a meal. 04/12/18 05/12/18 Yes Elodia Florence., MD  clopidogrel (PLAVIX) 75 MG tablet Take 1 tablet (75 mg total) by mouth daily. 04/13/18 05/13/18 Yes Elodia Florence., MD  gabapentin (NEURONTIN) 300 MG capsule Take 1 capsule (300 mg total) by mouth every 8 (eight) hours. 01/18/18  Yes Gillis Santa, MD  HYDROcodone-acetaminophen (NORCO) 10-325 MG tablet Take 1 tablet by mouth 3 (three) times daily as needed for severe pain. 03/29/18  Yes Gillis Santa, MD  hydroxychloroquine (PLAQUENIL) 200 MG tablet Take 200 mg by mouth 2 (two) times daily. 10/24/17  Yes [provider]  Menthol-Methyl Salicylate (MUSCLE RUB EX) Apply 1 application topically as needed (for shoulder or knee pain).    Yes [provider]  nicotine (NICODERM CQ - DOSED IN MG/24 HR) 7 mg/24hr patch Place 1 patch (7 mg total) onto the skin daily. 04/13/18  Yes Elodia Florence., MD  OXYGEN Inhale 2 L into the lungs at bedtime.   Yes [provider]  polyethylene glycol (MIRALAX / GLYCOLAX) packet Take 17 g by mouth 2 (two) times daily. Until having 1 soft BM daily, then can  decrease to daily or as needed 04/12/18  Yes Elodia Florence., MD  predniSONE (DELTASONE) 10 MG tablet Take 10 mg daily 05/04/17  Yes [provider]  PROVENTIL HFA 108 (90 Base) MCG/ACT inhaler INHALE 2 PUFFS EVERY 6 HOURS AS NEEDED FOR WHEEZE/SHORTNESS OF BREATH 03/29/18  Yes Ramaswamy, Belva Crome, MD  albuterol (ACCUNEB) 0.63 MG/3ML nebulizer solution TAKE 3 MLS (0.63 MG TOTAL) BY NEBULIZATION EVERY 6 (SIX) HOURS AS NEEDED FOR WHEEZING. 04/25/18   Brand Males, MD  collagenase (SANTYL) ointment Apply topically daily. Apply Santyl to sacrum/gluteal fold areas Q day, and cover with moist fluffed gauze and foam dressing.  (Change foam dressing Q 3 days or  PRN soiling.) 04/13/18   Elodia Florence., MD     Results for orders placed or performed during the hospital encounter of 05/09/18 (from the past 48 hour(s))  I-Stat CG4 Lactic Acid, ED     Status: Abnormal   Collection Time: 05/09/18  1:29 PM  Result Value Ref Range   Lactic Acid, Venous 2.54 (HH) 0.5 - 1.9 mmol/L   Comment NOTIFIED PHYSICIAN   Comprehensive metabolic panel     Status: Abnormal   Collection Time: 05/09/18  1:32 PM  Result Value Ref Range   Sodium 133 (L) 135 - 145 mmol/L   Potassium 4.0 3.5 - 5.1 mmol/L   Chloride 99 98 - 111 mmol/L    Comment: Please note change in reference range.   CO2 24 22 - 32 mmol/L   Glucose, Bld 178 (H) 70 - 99 mg/dL    Comment: Please note change in reference range.   BUN 19 8 - 23 mg/dL    Comment: Please note change in reference range.   Creatinine, Ser 0.77 0.61 - 1.24 mg/dL   Calcium 8.0 (L) 8.9 - 10.3 mg/dL   Total Protein 6.5 6.5 - 8.1 g/dL   Albumin 2.2 (L) 3.5 - 5.0 g/dL   AST 30 15 - 41 U/L   ALT 29 0 - 44 U/L    Comment: Please note change in reference range.   Alkaline Phosphatase 127 (H) 38 - 126 U/L   Total Bilirubin 0.8 0.3 - 1.2 mg/dL   GFR calc non Af Amer >60 >60 mL/min   GFR calc Af Amer >60 >60 mL/min    Comment: (NOTE) The eGFR has been calculated  using the CKD EPI equation. This calculation has not been validated in all clinical situations. eGFR's persistently <60 mL/min signify possible Chronic Kidney Disease.    Anion gap 10 5 - 15    Comment: Performed at [facility], [place] [address], [place], [place] [address]  CBC with Differential     Status: Abnormal   Collection Time: 05/09/18  1:32 PM  Result Value Ref Range   WBC 9.4 4.0 - 10.5 K/uL   RBC 4.03 (L) 4.22 - 5.81 MIL/uL   Hemoglobin 11.0 (L) 13.0 - 17.0 g/dL   HCT 34.7 (L) 39.0 - 52.0 %   MCV 86.1 78.0 - 100.0 fL   MCH 27.3 26.0 - 34.0 pg   MCHC 31.7 30.0 - 36.0 g/dL   RDW 15.8 (H) 11.5 - 15.5 %   Platelets 167 150 - 400 K/uL   Neutrophils Relative % 83 %   Lymphocytes Relative 9 %   Monocytes Relative 8 %   Eosinophils Relative 0 %   Basophils Relative 0 %   Neutro Abs 7.8 (H) 1.7 - 7.7 K/uL   Lymphs Abs 0.8 0.7 - 4.0 K/uL   Monocytes Absolute 0.8 0.1 - 1.0 K/uL   Eosinophils Absolute 0.0 0.0 - 0.7 K/uL   Basophils Absolute 0.0 0.0 - 0.1 K/uL   Smear Review MORPHOLOGY UNREMARKABLE     Comment: Performed at [facility], [place] [address], [place], [place] [address]  Protime-INR     Status: None   Collection Time: 05/09/18  1:32 PM  Result Value Ref Range   Prothrombin Time 13.9 11.4 - 15.2 seconds   INR 1.08     Comment: Performed at [facility], [place] [address], [place], [place] [address]  Brain natriuretic peptide     Status: Abnormal  Collection Time: 05/09/18  1:32 PM  Result Value Ref Range   B Natriuretic Peptide 232.1 (H) 0.0 - 100.0 pg/mL    Comment: Performed at [facility], [place] [address] [place], [place] [address]  I-Stat Troponin, ED (not at [facility])     Status: None   Collection Time: 05/09/18  2:56 PM  Result Value Ref Range   Troponin i, poc 0.08 0.00 - 0.08 ng/mL   Comment NOTIFIED PHYSICIAN    Comment 3            Comment: Due to the release kinetics of cTnI, a  negative result within the first hours of the onset of symptoms does not rule out myocardial infarction with certainty. If myocardial infarction is still suspected, repeat the test at appropriate intervals.   I-Stat CG4 Lactic Acid, ED     Status: Abnormal   Collection Time: 05/09/18  2:57 PM  Result Value Ref Range   Lactic Acid, Venous 2.28 (HH) 0.5 - 1.9 mmol/L   Comment NOTIFIED PHYSICIAN   Urinalysis, Routine w reflex microscopic     Status: None   Collection Time: 05/09/18  3:46 PM  Result Value Ref Range   Color, Urine YELLOW YELLOW   APPearance CLEAR CLEAR   Specific Gravity, Urine 1.014 1.005 - 1.030   pH 5.0 5.0 - 8.0   Glucose, UA NEGATIVE NEGATIVE mg/dL   Hgb urine dipstick NEGATIVE NEGATIVE   Bilirubin Urine NEGATIVE NEGATIVE   Ketones, ur NEGATIVE NEGATIVE mg/dL   Protein, ur NEGATIVE NEGATIVE mg/dL   Nitrite NEGATIVE NEGATIVE   Leukocytes, UA NEGATIVE NEGATIVE    Comment: Performed at [facility] [address], [place], [place] [address]    Dg Chest 2 View  Result Date: 05/09/2018 CLINICAL DATA:  Fever. Buttock wound. History of interstitial lung disease. EXAM: CHEST - 2 VIEW COMPARISON:  Chest radiograph April 06, 2018 FINDINGS: Cardiac silhouette is moderately enlarged unchanged. Status post median sternotomy for CABG with multiple fractured sternotomy wires. Similar chronic interstitial changes without pleural effusion or focal consolidation. No pneumothorax. Old LEFT rib fractures. IMPRESSION: 1. Stable interstitial prominence consistent with history of interstitial lung disease. 2. Stable cardiomegaly. Electronically Signed   By: Elon Alas M.D.   On: 05/09/2018 14:46   Ct Pelvis W Contrast  Result Date: 05/09/2018 CLINICAL DATA:  Buttock pain and fever. Sepsis muscle likely from buttock/perineal and cellulitis. Concern for possible necrotizing fasciitis. EXAM: CT PELVIS WITH CONTRAST TECHNIQUE: Multidetector CT imaging of the pelvis was  performed using the standard protocol following the bolus administration of intravenous contrast. CONTRAST:  110m ISOVUE-300 IOPAMIDOL (ISOVUE-300) INJECTION 61% COMPARISON:  08/08/2017 CT FINDINGS: Urinary Tract: Physiologic distention of the urinary bladder is noted. The distal ureters are unremarkable. Bowel: Normal appearing included appendix. Scattered colonic diverticulosis along the distal descending and sigmoid colon without acute diverticulitis. Mechanical bowel obstruction or inflammation. 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 Atherosclerosis with ectasia of the left common iliac artery 1.5 cm. No inguinal, pelvic sidewall, retroperitoneal or iliac chain adenopathy. Reproductive:  Normal size prostate and seminal vesicles. Other:  No free air nor free fluid. Musculoskeletal: Cellulitis of the medial right buttock with mottled peripherally enhancing soft tissue abscess or stigmata necrotizing fasciitis just caudad to the right ischium in the expected location of the distal right semimembranosus, semitendinosus, rectus femoris and quadratus femoris muscles and tendons. This collection contains mottled foci of gas and is estimated at 5.9 x 4.8  cm on series 3/130. The craniocaudad extent is difficult to ascertain on the reformats as it is incompletely included. Cortical bone loss is noted of the right ischial tuberosity is concerning for changes of osteomyelitis. IMPRESSION: 1. Abnormal enhancing soft tissue collection centered just caudad to the right ischial tuberosity measuring at least 5.9 x 4.7 cm hand AP by transverse dimension and involving the distal right semimembranosus, semitendinosus, rectus femoris and quadratus femoris muscles and tendons. This raises concern for soft tissue abscess and/or stigmata of necrotizing fasciitis. 2. Cortical bone loss of the right ischial tuberosity also raises concern changes of osteomyelitis. 3. Aneurysmal  dilatation at the aortic bifurcation to 3.4 cm transverse. 4. Ectasia of the left common iliac artery to 1.5 cm. Electronically Signed   By: Ashley Royalty M.D.   On: 05/09/2018 15:59    Review of Systems  Constitutional: Positive for chills and fever.  HENT: Negative.   Eyes: Negative.   Respiratory: Negative.   Cardiovascular: Negative.   Gastrointestinal: Negative.   Genitourinary:       He has a condom catheter in place.  Skin:       Patient reports local wound care at the skilled nursing facility.  He says it started bleeding and draining purulent material about 2 to 3 days ago.  Site is tender and he has sensation.  Neurological: Negative.   Endo/Heme/Allergies: Negative.   Psychiatric/Behavioral:       Patient in a skilled nursing facility.  No new acute changes.   Blood pressure 107/74, pulse 85, temperature (!) 101.4 F (38.6 C), temperature source Rectal, resp. rate (!) 24, weight 90.3 kg (199 lb), SpO2 95 %. Physical Exam  Constitutional: He is oriented to person, place, and time. He appears well-developed and well-nourished. No distress.  Chronically ill-appearing male, but in no acute distress.  HENT:  Head: Normocephalic and atraumatic.  Mouth/Throat: Oropharynx is clear and moist. No oropharyngeal exudate.  Eyes: Right eye exhibits no discharge. Left eye exhibits no discharge. No scleral icterus.  Pupils are equal  Neck: Normal range of motion. Neck supple. No JVD present. No tracheal deviation present. No thyromegaly present.  Cardiovascular: Normal rate, regular rhythm and normal heart sounds.  No murmur heard. Decreased distal pulses.  Well-healed venectomy scar right lower extremity.  Respiratory: Effort normal and breath sounds normal. No respiratory distress. He has no wheezes. He has no rales. He exhibits no tenderness.  GI: Soft. Bowel sounds are normal. He exhibits no distension and no mass. There is no tenderness. There is no rebound and no guarding.    Musculoskeletal: He exhibits no edema or tenderness.  Lymphadenopathy:    He has no cervical adenopathy.  Neurological: He is oriented to person, place, and time. No cranial nerve deficit.  Skin: He is not diaphoretic.  He has 2 areas.  The first is along the upper sacral area.  This area measures about 9 cm in diameter.  Its red tender and fluctuant.  The second area measures about 7 x 9 cm.  There is an open area about 3 cm in diameter it is draining purulent fluid.  This tracks about 10 cm deep going caudally.  He has sensation in this area.  See the picture below.  Psychiatric: He has a normal mood and affect. His behavior is normal. Judgment and thought content normal.      Assessment/Plan: Right buttocks abscesses, possible necrotizing fasciitis. 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 History of CABG 1993 Dr. Darius Bump Chronic systolic and diastolic congestive heart failure, EF 30 to 22%, grade 2 diastolic dysfunction 12/12/7987 Hypertension Abdominal aortic aneurysm History of acute kidney injury -resolved  Plan: Patient's been seen by Dr. Hassell Done and discussed with Pulmonary Critical Care Medicine. PCCM will admit and hydrate and start him on steroids for his adrenal insufficiency.  Dr. Hassell Done has discussed with the patient and has posted for Incision and drainage of the 2 rectal abscesses noted above on exam.  Dr. Armandina Gemma is on-call tonight and Dr. Hassell Done will discuss with Dr. Harlow Asa.  He has been started on clindamycin, vancomycin, and Zosyn.  Sharine Cadle 05/09/2018, 4:15 PM

## 2018-05-09 NOTE — ED Triage Notes (Signed)
Per EMS, pt from [facility] sent for wound to right buttocks. Wound is ~ 1.5 inches in diameter. Nurse states the wound had foul odor an was bleeding upon dressing change today. Pt states he has noticed bleeding x 3 days. Pt denies recent trauma to wound site, reports discomfort. Pt does not seen wound care specialist until the end of the month.   BP 110/60 HR 96 RR 16 CBG 163 T 99.9 temporally

## 2018-05-09 NOTE — ED Notes (Signed)
Dr. Juleen China made aware of critical lactic value of 2.54

## 2018-05-10 ENCOUNTER — Ambulatory Visit: Payer: Medicaid Other | Admitting: Physician Assistant

## 2018-05-10 ENCOUNTER — Encounter (HOSPITAL_COMMUNITY): Payer: Self-pay | Admitting: Surgery

## 2018-05-10 ENCOUNTER — Other Ambulatory Visit: Payer: Self-pay

## 2018-05-10 DIAGNOSIS — R0902 Hypoxemia: Secondary | ICD-10-CM

## 2018-05-10 DIAGNOSIS — A419 Sepsis, unspecified organism: Secondary | ICD-10-CM

## 2018-05-10 DIAGNOSIS — R6521 Severe sepsis with septic shock: Secondary | ICD-10-CM

## 2018-05-10 LAB — CBC
HCT: 30.1 % — ABNORMAL LOW (ref 39.0–52.0)
HEMOGLOBIN: 9.4 g/dL — AB (ref 13.0–17.0)
MCH: 27.3 pg (ref 26.0–34.0)
MCHC: 31.2 g/dL (ref 30.0–36.0)
MCV: 87.5 fL (ref 78.0–100.0)
PLATELETS: 161 10*3/uL (ref 150–400)
RBC: 3.44 MIL/uL — AB (ref 4.22–5.81)
RDW: 15.7 % — ABNORMAL HIGH (ref 11.5–15.5)
WBC: 9.8 10*3/uL (ref 4.0–10.5)

## 2018-05-10 LAB — BASIC METABOLIC PANEL
Anion gap: 7 (ref 5–15)
BUN: 16 mg/dL (ref 8–23)
CHLORIDE: 107 mmol/L (ref 98–111)
CO2: 23 mmol/L (ref 22–32)
CREATININE: 0.75 mg/dL (ref 0.61–1.24)
Calcium: 7.8 mg/dL — ABNORMAL LOW (ref 8.9–10.3)
GFR calc Af Amer: >60 mL/min (ref >60)
Glucose, Bld: 192 mg/dL — ABNORMAL HIGH (ref 70–99)
POTASSIUM: 4.4 mmol/L (ref 3.5–5.1)
SODIUM: 137 mmol/L (ref 135–145)

## 2018-05-10 LAB — URINE CULTURE: Culture: <10000 — AB

## 2018-05-10 LAB — MRSA PCR SCREENING: MRSA BY PCR: NEGATIVE

## 2018-05-10 MED ORDER — PRO-STAT SUGAR FREE PO LIQD
30.0000 mL | Freq: Two times a day (BID) | ORAL | Status: DC
  Administered 2018-05-10 – 2018-05-18 (×17): 30 mL via ORAL
  Filled 2018-05-10 (×17): qty 30

## 2018-05-10 MED ORDER — ENOXAPARIN SODIUM 40 MG/0.4ML ~~LOC~~ SOLN
40.0000 mg | SUBCUTANEOUS | Status: DC
  Administered 2018-05-11 – 2018-05-18 (×8): 40 mg via SUBCUTANEOUS
  Filled 2018-05-10 (×8): qty 0.4

## 2018-05-10 MED ORDER — SACCHAROMYCES BOULARDII 250 MG PO CAPS
250.0000 mg | ORAL_CAPSULE | Freq: Two times a day (BID) | ORAL | Status: DC
  Administered 2018-05-10 – 2018-05-18 (×17): 250 mg via ORAL
  Filled 2018-05-10 (×17): qty 1

## 2018-05-10 MED ORDER — JUVEN PO PACK
1.0000 | PACK | Freq: Two times a day (BID) | ORAL | Status: DC
  Administered 2018-05-10 – 2018-05-18 (×18): 1 via ORAL
  Filled 2018-05-10 (×18): qty 1

## 2018-05-10 MED ORDER — ADULT MULTIVITAMIN W/MINERALS CH
1.0000 | ORAL_TABLET | Freq: Every day | ORAL | Status: DC
  Administered 2018-05-10 – 2018-05-18 (×9): 1 via ORAL
  Filled 2018-05-10 (×9): qty 1

## 2018-05-10 NOTE — Progress Notes (Signed)
[facility] Progress Note  1 Day Post-Op  Subjective: CC: R gluteal wound Patient reports pain as moderate. Denies chest pain, SOB, palpitations, nausea, abdominal pain. Patient has sensation to area, but is unable to mobilize due to recent stroke.   Objective: Vital signs in last 24 hours: Temp:  [97.8 F (36.6 C)-101.4 F (38.6 C)] 98.8 F (37.1 C) (07/09 0308) Pulse Rate:  [67-89] 69 (07/09 0500) Resp:  [11-36] 17 (07/09 0500) BP: (98-130)/(56-88) 105/67 (07/09 0500) SpO2:  [94 %-100 %] 96 % (07/09 0500) Weight:  [87.2 kg (192 lb 3.9 oz)-90.3 kg (199 lb)] 87.2 kg (192 lb 3.9 oz) (07/09 0500)    Intake/Output from previous day: 07/08 0701 - 07/09 0700 In: 2525.5 [I.V.:2062.5; IV Piggyback:463] Out: 1425 [Urine:1400; Blood:25] Intake/Output this shift: No intake/output data recorded.  PE: Gen:  Alert, NAD, pleasant Card:  Regular rate and rhythm, pedal pulses 2+ BL Pulm:  Normal effort, clear to auscultation bilaterally Abd: Soft, non-tender, non-distended, bowel sounds present GU:  R gluteal wound with foul odor and muscle/tendon exposed, small amount bleeding controlled with pressure, scattered tracts probed and clots evacuated, packing with moderate bloody purulent drainage.   Psych: A&Ox3   Lab Results:  Recent Labs    05/09/18 1332 05/10/18 0358  WBC 9.4 9.8  HGB 11.0* 9.4*  HCT 34.7* 30.1*  PLT 167 161   BMET Recent Labs    05/09/18 1332 05/10/18 0358  NA 133* 137  K 4.0 4.4  CL 99 107  CO2 24 23  GLUCOSE 178* 192*  BUN 19 16  CREATININE 0.77 0.75  CALCIUM 8.0* 7.8*   PT/INR Recent Labs    05/09/18 1332  LABPROT 13.9  INR 1.08   CMP     Component Value Date/Time   NA 137 05/10/2018 0358   NA 142 02/08/2018 1422   K 4.4 05/10/2018 0358   CL 107 05/10/2018 0358   CO2 23 05/10/2018 0358   GLUCOSE 192 (H) 05/10/2018 0358   BUN 16 05/10/2018 0358   BUN 12 02/08/2018 1422   CREATININE 0.75 05/10/2018 0358   CALCIUM 7.8  (L) 05/10/2018 0358   PROT 6.5 05/09/2018 1332   PROT 7.5 08/26/2017 1200   ALBUMIN 2.2 (L) 05/09/2018 1332   ALBUMIN 4.1 08/26/2017 1200   AST 30 05/09/2018 1332   ALT 29 05/09/2018 1332   ALKPHOS 127 (H) 05/09/2018 1332   BILITOT 0.8 05/09/2018 1332   BILITOT 0.9 08/26/2017 1200   GFRNONAA >60 05/10/2018 0358   GFRAA >60 05/10/2018 0358   Lipase     Component Value Date/Time   LIPASE 26 11/01/2017 1410       Studies/Results: Dg Chest 2 View  Result Date: 05/09/2018 CLINICAL DATA:  Fever. Buttock wound. History of interstitial lung disease. EXAM: CHEST - 2 VIEW COMPARISON:  Chest radiograph April 06, 2018 FINDINGS: Cardiac silhouette is moderately enlarged unchanged. Status post median sternotomy for CABG with multiple fractured sternotomy wires. Similar chronic interstitial changes without pleural effusion or focal consolidation. No pneumothorax. Old LEFT rib fractures. IMPRESSION: 1. Stable interstitial prominence consistent with history of interstitial lung disease. 2. Stable cardiomegaly. Electronically Signed   By: Awilda Metro M.D.   On: 05/09/2018 14:46   Ct Pelvis W Contrast  Result Date: 05/09/2018 CLINICAL DATA:  Buttock pain and fever. Sepsis muscle likely from buttock/perineal and cellulitis. Concern for possible necrotizing fasciitis. EXAM: CT PELVIS WITH CONTRAST TECHNIQUE: Multidetector CT imaging of the pelvis was performed using the standard protocol  following the bolus administration of intravenous contrast. CONTRAST:  ISOVUE-300 IOPAMIDOL (ISOVUE-300) INJECTION 61% COMPARISON:  08/08/2017 CT FINDINGS: Urinary Tract: Physiologic distention of the urinary bladder is noted. The distal ureters are unremarkable. Bowel: Normal appearing included appendix. Scattered colonic diverticulosis along the distal descending and sigmoid colon without acute diverticulitis. Mechanical bowel obstruction or inflammation. 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 Atherosclerosis with ectasia of the left common iliac artery 1.5 cm. No inguinal, pelvic sidewall, retroperitoneal or iliac chain adenopathy. Reproductive:  Normal size prostate and seminal vesicles. Other:  No free air nor free fluid. Musculoskeletal: Cellulitis of the medial right buttock with mottled peripherally enhancing soft tissue abscess or stigmata necrotizing fasciitis just caudad to the right ischium in the expected location of the distal right semimembranosus, semitendinosus, rectus femoris and quadratus femoris muscles and tendons. This collection contains mottled foci of gas and is estimated at 5.9 x 4.8 cm on series 3/130. The craniocaudad extent is difficult to ascertain on the reformats as it is incompletely included. Cortical bone loss is noted of the right ischial tuberosity is concerning for changes of osteomyelitis. IMPRESSION: 1. Abnormal enhancing soft tissue collection centered just caudad to the right ischial tuberosity measuring at least 5.9 x 4.7 cm hand AP by transverse dimension and involving the distal right semimembranosus, semitendinosus, rectus femoris and quadratus femoris muscles and tendons. This raises concern for soft tissue abscess and/or stigmata of necrotizing fasciitis. 2. Cortical bone loss of the right ischial tuberosity also raises concern changes of osteomyelitis. 3. 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 4. Ectasia of the left common iliac artery to 1.5 cm. Electronically Signed   By: Tollie Eth M.D.   On: 05/09/2018 15:59    Anti-infectives: Anti-infectives (From admission, onward)   Start     Dose/Rate Route Frequency Ordered Stop   05/10/18 0600  vancomycin (VANCOCIN) 1,250 mg in sodium chloride 0.9 % 250 mL IVPB     1,250 mg 166.7 mL/hr over 90 Minutes Intravenous Every 12 hours 05/09/18 1724     05/09/18 2200  piperacillin-tazobactam (ZOSYN) IVPB 3.375 g  Status:  Discontinued     3.375  g 100 mL/hr over 30 Minutes Intravenous  Once 05/09/18 2156 05/09/18 2202   05/09/18 2200  clindamycin (CLEOCIN) IVPB 600 mg     600 mg 100 mL/hr over 30 Minutes Intravenous Every 8 hours 05/09/18 2156     05/09/18 2200  piperacillin-tazobactam (ZOSYN) IVPB 3.375 g     3.375 g 12.5 mL/hr over 240 Minutes Intravenous Every 8 hours 05/09/18 1717     05/09/18 1600  clindamycin (CLEOCIN) IVPB 600 mg     600 mg 100 mL/hr over 30 Minutes Intravenous  Once 05/09/18 1551 05/09/18 1657   05/09/18 1430  piperacillin-tazobactam (ZOSYN) IVPB 3.375 g     3.375 g 100 mL/hr over 30 Minutes Intravenous  Once 05/09/18 1420 05/09/18 1542   05/09/18 1430  vancomycin (VANCOCIN) IVPB 1000 mg/200 mL premix  Status:  Discontinued     1,000 mg 200 mL/hr over 60 Minutes Intravenous  Once 05/09/18 1420 05/09/18 1426   05/09/18 1430  vancomycin (VANCOCIN) 2,000 mg in sodium chloride 0.9 % 500 mL IVPB     2,000 mg 250 mL/hr over 120 Minutes Intravenous STAT 05/09/18 1426 05/09/18 1750       Assessment/Plan History of adrenal insufficiency, with hypotension shock  -hospitalized 6/3 - 04/12/2018 Left basal ganglia and posterior left centrum ovale  stroke 04/10/2018 -  On Plavix Interstitial lung disease secondary to rheumatoid arthritis and mold exposure Chronic respiratory failure previously on home O2 History of COPD with tobacco use -discontinued smoking during his last hospitalization. History of CABG 1993 Dr. Sharmaine Base Chronic systolic and diastolic congestive heart failure, EF 30 to 35%, grade 2 diastolic dysfunction 04/05/2018 Hypertension AAA  Right buttocks abscesses - s/p I&D 05/09/18 Dr. Gerrit Friends  - POD#1 - dressing changed with some bleeding - WBC 9.8, afebrile post-operatively  - cxs pending - continue BS IV abx - dressing changes BID with saline moistened kerlix - will order air overlay mattress - need to keep pressure off wound as much as possible  FEN: ok to have a diet from a surgery  standpoint VTE: SCDs, hold lovenox today with bleeding can resume tomorrow ID: [email or web address] 7/8 >>   LOS: 1 day    Wells Guiles , [facility] Surgery 05/10/2018, 7:03 AM Pager: [phone] Consults: [phone] Mon-Fri 7:00 am-4:30 pm Sat-Sun 7:00 am-11:30 am

## 2018-05-10 NOTE — Op Note (Signed)
NAME: [name], [name] MEDICAL RECORD [id number] ACCOUNT [id number] DATE OF BIRTH:July 21, 1956 FACILITY: [facility] LOCATION: [facility] PHYSICIAN:Dianely Krehbiel Molly Maduro, MD  OPERATIVE REPORT  DATE OF PROCEDURE:  05/09/2018  PREOPERATIVE DIAGNOSIS:  Right buttock abscess, decubitus ulceration, probable osteomyelitis.  POSTOPERATIVE DIAGNOSIS:  Right buttock abscess, decubitus ulceration, probable osteomyelitis.  PROCEDURE:  Incision, debridement, and drainage of abscess right buttock with debridement of skin, subcutaneous tissue, muscle, and fascia; total area debrided 30 x 15 x 8 cm.  SURGEON:  Velora Heckler, MD.  ANESTHESIA:  General per Dr. Arta Bruce.   ESTIMATED BLOOD LOSS:  Minimal.  PREPARATION:  Betadine.  COMPLICATIONS:  None.  INDICATIONS:  The patient is a 62 year old male who presents to the Emergency Department with an evolving decubitus ulcer on the right buttock.  CT scan demonstrated a large abscess extending down to the ischial tuberosity.  The patient had biochemical  evidence of sepsis with a markedly elevated lactic acid.  The patient was seen by critical care and started on intravenous antibiotics.  He was given stress dose steroids for Addison's.  The patient was prepared and is now brought to the operating room  urgently for debridement.  Procedure was done at OR #1 at the [facility].  DESCRIPTION OF PROCEDURE:  The patient was brought to the operating room and placed in a supine position on the operating room table.  Following administration of general anesthesia, the patient was turned to the left lateral decubitus position on the  beanbag.  He is properly supported and padded.  The right buttock was then prepped and draped in the usual aseptic fashion.  After a timeout, the procedure was initiated.  Digital exploration of the wound shows a multiloculated cavity extending deeply  into the right buttock.  Using the electrocautery, a skin incision is  made to open the skin above the abscess cavities.  Again, using blunt dissection, the abscess cavity was tracked down to the ischial tuberosity.  It does extend to the bone.  There are  tracts extending cephalad anteriorly and posteriorly.  These are laid open by debriding the above skin, subcutaneous tissue and muscle, all of which appeared necrotic.  Aerobic and anaerobic cultures were obtained and submitted to the laboratory.  There  is an additional tract of infection going down the posterior thigh for several centimeters.  This is opened through the skin and subcutaneous tissues and into the cavity.  Again, there appears to be devitalized muscle.  Devitalized tissue is debrided.  The wound was then irrigated with warm saline.  Hemostasis was achieved with the electrocautery.  The wound measures 30 x 15 x 8 cm in greatest dimension.  The entire wound was packed with Betadine soaked 4 inch Kerlix gauze.  Three complete Kerlix gauze  wraps were placed into the wound with packing into each of the involved cavities.  Dressings were then covered with dry gauze and ABD pads, which were secured with tape.  The patient is awakened from anesthesia and extubated.  He is taken to the recovery room in stable condition.  The patient tolerated the procedure well.  Darnell Level, MD [facility] Surgery Office: [phone]    TN/NUANCE  D:05/09/2018 T:05/10/2018 JOB:[id number]

## 2018-05-10 NOTE — Progress Notes (Addendum)
PULMONARY / CRITICAL CARE MEDICINE   Name: [name] MRN: [id number] DOB: Feb 02, 1956    ADMISSION DATE:  05/09/2018 CONSULTATION DATE: May 09, 2018  REFERRING MD: Dr. Algie Coffer  CHIEF COMPLAINT: Wound pain  HISTORY OF PRESENT ILLNESS:   62 year old male well-known to the [place] pulmonary critical care medicine service because of his known COPD interstitial lung disease and connective tissue disease with chronic immunosuppression who returns to our facility complaining of right sided buttock pain.  He has had a wound which is being received dressing at his nursing facility for the last 2 weeks.  He says it has been bleeding intermittently and they have been giving him wound dressing changes there.  Today it bled more so they sent him here for further evaluation.  He had a CT scan which showed evidence of necrotizing skin infection so pulmonary and critical care medicine was asked to admit and surgery was consulted.  The patient says that he denies any shortness of breath, nausea or vomiting.  He had a normal breakfast this morning.   SUBJECTIVE:  Pt denies pain, SOB.  States he is hungry. Tmax 101.4.    VITAL SIGNS: BP (!) 104/55   Pulse 63   Temp 98.8 F (37.1 C) (Axillary)   Resp 19   Ht 6\' 1"  (1.854 m)   Wt 192 lb 3.9 oz (87.2 kg)   SpO2 96%   BMI 25.36 kg/m   HEMODYNAMICS:    VENTILATOR SETTINGS:    INTAKE / OUTPUT: I/O last 3 completed shifts: In: 2525.5 [I.V.:2062.5; IV Piggyback:463] Out: 1425 [Urine:1400; Blood:25]  PHYSICAL EXAMINATION: General: adult male in NAD, sitting up in bed HEENT: MM pink/moist, no jvd Neuro: AAOx4, speech clear CV: s1s2 rrr, no m/r/g PULM: even/non-labored, lungs bilaterally with bibasilar crackles  , non-tender, bsx4 active  Extremities: warm/dry, no edema  Skin: no rashes or lesions  LABS:  BMET Recent Labs  Lab 05/09/18 1332 05/10/18 0358  NA 133* 137  K 4.0 4.4  CL 99 107  CO2 24 23  BUN 19 16  CREATININE  0.77 0.75  GLUCOSE 178* 192*    Electrolytes Recent Labs  Lab 05/09/18 1332 05/10/18 0358  CALCIUM 8.0* 7.8*    CBC Recent Labs  Lab 05/09/18 1332 05/10/18 0358  WBC 9.4 9.8  HGB 11.0* 9.4*  HCT 34.7* 30.1*  PLT 167 161    Coag's Recent Labs  Lab 05/09/18 1332  INR 1.08    Sepsis Markers Recent Labs  Lab 05/09/18 1329 05/09/18 1457  LATICACIDVEN 2.54* 2.28*    ABG No results for input(s): PHART, PCO2ART, PO2ART in the last 168 hours.  Liver Enzymes Recent Labs  Lab 05/09/18 1332  AST 30  ALT 29  ALKPHOS 127*  BILITOT 0.8  ALBUMIN 2.2*    Cardiac Enzymes No results for input(s): TROPONINI, PROBNP in the last 168 hours.  Glucose No results for input(s): GLUCAP in the last 168 hours.  Imaging Dg Chest 2 View  Result Date: 05/09/2018 CLINICAL DATA:  Fever. Buttock wound. History of interstitial lung disease. EXAM: CHEST - 2 VIEW COMPARISON:  Chest radiograph April 06, 2018 FINDINGS: Cardiac silhouette is moderately enlarged unchanged. Status post median sternotomy for CABG with multiple fractured sternotomy wires. Similar chronic interstitial changes without pleural effusion or focal consolidation. No pneumothorax. Old LEFT rib fractures. IMPRESSION: 1. Stable interstitial prominence consistent with history of interstitial lung disease. 2. Stable cardiomegaly. Electronically Signed   By: April 08, 2018 M.D.   On: 05/09/2018  14:46   Ct Pelvis W Contrast  Result Date: 05/09/2018 CLINICAL DATA:  Buttock pain and fever. Sepsis muscle likely from buttock/perineal and cellulitis. Concern for possible necrotizing fasciitis. EXAM: CT PELVIS WITH CONTRAST TECHNIQUE: Multidetector CT imaging of the pelvis was performed using the standard protocol following the bolus administration of intravenous contrast. CONTRAST:  ISOVUE-300 IOPAMIDOL (ISOVUE-300) INJECTION 61% COMPARISON:  08/08/2017 CT FINDINGS: Urinary Tract: Physiologic distention of the urinary bladder  is noted. The distal ureters are unremarkable. Bowel: Normal appearing included appendix. Scattered colonic diverticulosis along the distal descending and sigmoid colon without acute diverticulitis. Mechanical bowel obstruction or inflammation. 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 Atherosclerosis with ectasia of the left common iliac artery 1.5 cm. No inguinal, pelvic sidewall, retroperitoneal or iliac chain adenopathy. Reproductive:  Normal size prostate and seminal vesicles. Other:  No free air nor free fluid. Musculoskeletal: Cellulitis of the medial right buttock with mottled peripherally enhancing soft tissue abscess or stigmata necrotizing fasciitis just caudad to the right ischium in the expected location of the distal right semimembranosus, semitendinosus, rectus femoris and quadratus femoris muscles and tendons. This collection contains mottled foci of gas and is estimated at 5.9 x 4.8 cm on series 3/130. The craniocaudad extent is difficult to ascertain on the reformats as it is incompletely included. Cortical bone loss is noted of the right ischial tuberosity is concerning for changes of osteomyelitis. IMPRESSION: 1. Abnormal enhancing soft tissue collection centered just caudad to the right ischial tuberosity measuring at least 5.9 x 4.7 cm hand AP by transverse dimension and involving the distal right semimembranosus, semitendinosus, rectus femoris and quadratus femoris muscles and tendons. This raises concern for soft tissue abscess and/or stigmata of necrotizing fasciitis. 2. Cortical bone loss of the right ischial tuberosity also raises concern changes of osteomyelitis. 3. 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 4. Ectasia of the left common iliac artery to 1.5 cm. Electronically Signed   By: Tollie Eth M.D.   On: 05/09/2018 15:59     STUDIES:  7/8 CT pelvis >> abnormal soft tissue collection right ischial  tuberosity me concerning for necrotizing infection, other findings as detailed in radiology report  CULTURES: BCx2 7/8 >>  UC 7/8 >> Abscess 7/8 >>   ANTIBIOTICS: Vancomycin 7/8 >> Zosyn 7/8 >> Clindamycin 7/8 >>  SIGNIFICANT EVENTS: 7/08  Admit with necrotizing infection   LINES/TUBES:   DISCUSSION: 62 year old male chronically immunosuppressed with COPD and interstitial lung disease presents with an infected right buttock wound concerning for necrotizing infection.  ASSESSMENT / PLAN:  PULMONARY A: COPD Interstitial lung disease P:   Hold immunosuppression  Monitor in SDU  Duoneb Q6 Nicotine patch   CARDIOVASCULAR A:  Hypertension, at risk for developing severe sepsis P:  Tele monitoring  D51/2NS with 20 mEQ KCL at 125  Stress dose steroids for now   RENAL A:   No acute issues P:   Trend BMP / urinary output Replace electrolytes as indicated Avoid nephrotoxic agents, ensure adequate renal perfusion  GASTROINTESTINAL A:   No acute issues  P:   Diet as tolerated  Florastor BID given abx, risk of C-diff  HEMATOLOGIC A:   Chronic anemia no bleeding P:  Monitor for bleeding  Trend CBC  Transfuse per ICU guidelines  SCD's  Lovenox to begin 7/10 if no further bleeding   INFECTIOUS A:   Necrotizing skin infection P:   ABX as above  Follow cultures Wound care, post  operative recommendations per [facility]   ENDOCRINE A:   Relative adrenal insufficiency P:   Hydrocortisone 50 mg Q6  NEUROLOGIC A:   No acute issues P:   PRN norco, dilaudid for pain control Continue gabapentin   FAMILY  - Updates:  Patient updated on plan of care  - Global: To telemetry, TRH as of 7/10 am.  PCCM will be available PRN.  Please call back if new needs arise.    Canary Brim, NP-C [facility] Pgr: [phone] or if no answer [phone] 05/10/2018, 8:40 AM  Attending Note:  62 year old male with ILD, presenting with necrotizing fascitis s/p  operative debridement who is now off pressors.  On exam, patient is alert and interactive, moving all ext to command with clear lungs.  I reviewed CXR myself, infiltrate noted.  Discussed with PCCM-NP.  Septic shock:  - D/C neo  - IVF resuscitation as ordered  Infection:  - Clinda  - Vanc  - Zosyn  - F/U on cultures  ILD:  - No need for steroids at this time  Hypoxemia:  - Titrate O2 fors at of 88-92%.  Tobacco abuse:  - Nicotine patch  - Smoking cessation  Will transfer to tele and to [facility] service with PCCM off 7/10.  Patient seen and examined, agree with above note.  I dictated the care and orders written for this patient under my direction.  Alyson Reedy, MD [phone]

## 2018-05-10 NOTE — Progress Notes (Signed)
Initial Nutrition Assessment  DOCUMENTATION CODES:   Not applicable  INTERVENTION:  - Will order Juven BID, each packet provides 80 calories and 14 grams of amino acids; supplement contains CaHMB, glutamine, and arginine, to promote wound healing. - Will order 30 mL Prostat BID, each supplement provides 100 kcal and 15 grams of protein. - Will order daily multivitamin with minerals.  - Continue to encourage PO intakes, with a focus on protein for wound healing.  - Recommend zinc and vitamin C x10-14 days to promote wound healing.    NUTRITION DIAGNOSIS:   Increased nutrient needs related to wound healing as evidenced by estimated needs.  GOAL:   Patient will meet greater than or equal to 90% of their needs  MONITOR:   PO intake, Supplement acceptance, Weight trends, Labs, Skin  REASON FOR ASSESSMENT:   Other (Comment)(Pressure Injury screening report)  ASSESSMENT:   62 year old male well-known to the [place] pulmonary critical care medicine service because of his known COPD interstitial lung disease and connective tissue disease with chronic immunosuppression who returns to our facility complaining of right sided buttock pain. He has had a wound which is being received dressing at his nursing facility for the last 2 weeks. He had a CT scan which showed evidence of necrotizing skin infection so pulmonary and critical care medicine was asked to admit and surgery was consulted.  BMI indicates overweight status. Patient was NPO and diet advanced to Heart Healthy, thin liquids about an hour ago. Patient was last seen by an RD 1 month ago prior to going to [facility]. He had a very good appetite at that time; was eating 3 meals per day with snacks and sister had also reported patient drinking excessive amounts of fluid (such as a 12 pack of soda/day). Patient with ongoing good appetite and no difficulties with chewing, swallowing, or any abdominal pain or nausea now or PTA.   Per Dr.  Percival Spanish note this AM: pt with HTN and at risk for severe sepsis, Florastor to decrease risk of C. Diff while on abx.  Patient is POD #1 R buttock I&D.  NFPE completed. Per chart review, pt has lost 15 lbs (7% body weight) in the past 3 months. This is not significant for time frame. Will continue to monitor weight trends closely.   Medications reviewed; 100 mg Solu-cortef x1 yesterday, 50 mg Solu-cortef QID, 250 mg Florastor BID.  Labs reviewed; Ca: 7.8 mg/dL  IVF: Q8-2/5 NS @ 003 mL/hr (510 kcal).      NUTRITION - FOCUSED PHYSICAL EXAM:  Completed; no muscle and no fat wasting. No edema noted at this time.   Diet Order:   Diet Order           Diet Heart Room service appropriate? Yes; Fluid consistency: Thin  Diet effective now          EDUCATION NEEDS:   No education needs have been identified at this time  Skin:  Skin Assessment: Skin Integrity Issues: Skin Integrity Issues:: Other (Comment) Other: R buttocks necrotizing fascitis   Last BM:  PTA/unknown  Height:   Ht Readings from Last 1 Encounters:  05/09/18 6\' 1"  (1.854 m)    Weight:   Wt Readings from Last 1 Encounters:  05/10/18 192 lb 3.9 oz (87.2 kg)    Ideal Body Weight:  83.64 kg  BMI:  Body mass index is 25.36 kg/m.  Estimated Nutritional Needs:   Kcal:  2180-2440 (25-28 kcal/kg)  Protein:  131-148 grams (1.5-1.7 grams/kg)  Fluid:  >/= 2.1 L/day     [name], MS, RD, LDN, [facility] Inpatient Clinical Dietitian Pager # [phone] After hours/weekend pager # [phone]

## 2018-05-11 DIAGNOSIS — F17210 Nicotine dependence, cigarettes, uncomplicated: Secondary | ICD-10-CM

## 2018-05-11 DIAGNOSIS — J849 Interstitial pulmonary disease, unspecified: Secondary | ICD-10-CM

## 2018-05-11 DIAGNOSIS — J449 Chronic obstructive pulmonary disease, unspecified: Secondary | ICD-10-CM

## 2018-05-11 DIAGNOSIS — I10 Essential (primary) hypertension: Secondary | ICD-10-CM

## 2018-05-11 DIAGNOSIS — I714 Abdominal aortic aneurysm, without rupture: Secondary | ICD-10-CM

## 2018-05-11 DIAGNOSIS — I5042 Chronic combined systolic (congestive) and diastolic (congestive) heart failure: Secondary | ICD-10-CM

## 2018-05-11 DIAGNOSIS — L89312 Pressure ulcer of right buttock, stage 2: Secondary | ICD-10-CM

## 2018-05-11 DIAGNOSIS — E2740 Unspecified adrenocortical insufficiency: Secondary | ICD-10-CM

## 2018-05-11 DIAGNOSIS — Z7952 Long term (current) use of systemic steroids: Secondary | ICD-10-CM

## 2018-05-11 DIAGNOSIS — M069 Rheumatoid arthritis, unspecified: Secondary | ICD-10-CM

## 2018-05-11 DIAGNOSIS — I6930 Unspecified sequelae of cerebral infarction: Secondary | ICD-10-CM

## 2018-05-11 DIAGNOSIS — L0231 Cutaneous abscess of buttock: Secondary | ICD-10-CM | POA: Diagnosis present

## 2018-05-11 DIAGNOSIS — Z9981 Dependence on supplemental oxygen: Secondary | ICD-10-CM

## 2018-05-11 DIAGNOSIS — Z8673 Personal history of transient ischemic attack (TIA), and cerebral infarction without residual deficits: Secondary | ICD-10-CM

## 2018-05-11 DIAGNOSIS — D649 Anemia, unspecified: Secondary | ICD-10-CM

## 2018-05-11 LAB — BASIC METABOLIC PANEL
Anion gap: 6 (ref 5–15)
BUN: 18 mg/dL (ref 8–23)
CALCIUM: 7.9 mg/dL — AB (ref 8.9–10.3)
CHLORIDE: 106 mmol/L (ref 98–111)
CO2: 24 mmol/L (ref 22–32)
CREATININE: 0.66 mg/dL (ref 0.61–1.24)
GFR calc non Af Amer: >60 mL/min (ref >60)
Glucose, Bld: 209 mg/dL — ABNORMAL HIGH (ref 70–99)
Potassium: 4.8 mmol/L (ref 3.5–5.1)
SODIUM: 136 mmol/L (ref 135–145)

## 2018-05-11 LAB — CBC
HCT: 27.1 % — ABNORMAL LOW (ref 39.0–52.0)
Hemoglobin: 8.5 g/dL — ABNORMAL LOW (ref 13.0–17.0)
MCH: 27.8 pg (ref 26.0–34.0)
MCHC: 31.4 g/dL (ref 30.0–36.0)
MCV: 88.6 fL (ref 78.0–100.0)
Platelets: 200 K/uL (ref 150–400)
RBC: 3.06 MIL/uL — ABNORMAL LOW (ref 4.22–5.81)
RDW: 15.5 % (ref 11.5–15.5)
WBC: 7 K/uL (ref 4.0–10.5)

## 2018-05-11 LAB — LACTIC ACID, PLASMA: LACTIC ACID, VENOUS: 1.7 mmol/L (ref 0.5–1.9)

## 2018-05-11 MED ORDER — CARVEDILOL 3.125 MG PO TABS
3.1250 mg | ORAL_TABLET | Freq: Two times a day (BID) | ORAL | Status: DC
  Administered 2018-05-11 – 2018-05-13 (×5): 3.125 mg via ORAL
  Filled 2018-05-11 (×5): qty 1

## 2018-05-11 MED ORDER — IPRATROPIUM-ALBUTEROL 0.5-2.5 (3) MG/3ML IN SOLN
3.0000 mL | Freq: Three times a day (TID) | RESPIRATORY_TRACT | Status: DC
  Administered 2018-05-12: 3 mL via RESPIRATORY_TRACT
  Filled 2018-05-11 (×2): qty 3

## 2018-05-11 MED ORDER — ALBUTEROL SULFATE (2.5 MG/3ML) 0.083% IN NEBU
2.5000 mg | INHALATION_SOLUTION | Freq: Four times a day (QID) | RESPIRATORY_TRACT | Status: DC | PRN
  Administered 2018-05-17: 2.5 mg via RESPIRATORY_TRACT
  Filled 2018-05-11: qty 3

## 2018-05-11 MED ORDER — ATORVASTATIN CALCIUM 40 MG PO TABS
40.0000 mg | ORAL_TABLET | Freq: Every day | ORAL | Status: DC
  Administered 2018-05-11 – 2018-05-17 (×7): 40 mg via ORAL
  Filled 2018-05-11 (×7): qty 1

## 2018-05-11 NOTE — Plan of Care (Signed)
Pt not displaying anxiety. Pt's pain was significantly reduced with the last medication administration.

## 2018-05-11 NOTE — Consult Note (Signed)
[name]    Date of Admission:  05/09/2018   Total days of antibiotics: 2 vanco/zosyn/clinda               Reason for Consult: Necrotizing fascitis    Referring Provider: Thompson   Assessment: Necrotizing fascitis Decubitus ulcer  Abscess COPD ILD on plaquinil, steroids chronically  Plan: 1. Stop vanco 2. Stop clinda 3. Continue zosyn while we await Cx 4. Will need long term anbx with osteo on his CT  Comment- Has multiple risk facgtors for this infection- immunosuppression, immobilty.  Will d/c vanco as his MRSA pcr was (-).  Thank you so much for this interesting consult,  Principal Problem:   Abscess of buttock Active Problems:   CAD (coronary artery disease)   ILD (interstitial lung disease) ([place])   Anemia   HTN (hypertension)   S/P CABG (coronary artery bypass graft)   Chronic combined systolic and diastolic CHF (congestive heart failure) (HCC)   Hx of rheumatoid arthritis   Rheumatoid arthritis (HCC)   Chronic obstructive pulmonary disease (HCC)   Supplemental oxygen dependent   AAA (abdominal aortic aneurysm) without rupture (HCC)   History of CVA with residual deficit   Adrenal insufficiency ([name]): Relative   . atorvastatin  40 mg Oral q1800  . carvedilol  3.125 mg Oral BID WC  . enoxaparin (LOVENOX) injection  40 mg Subcutaneous Q24H  . feeding supplement (PRO-STAT SUGAR FREE 64)  30 mL Oral BID  . gabapentin  300 mg Oral Q8H  . hydrocortisone sodium succinate  50 mg Intravenous Q6H  . ipratropium-albuterol  3 mL Nebulization Q6H  . multivitamin with minerals  1 tablet Oral Daily  . nicotine  7 mg Transdermal Daily  . nutrition supplement (JUVEN)  1 packet Oral BID BM  . saccharomyces boulardii  250 mg Oral BID    HPI: [name] is a 62 y.o. male with hx of COPD and ILD (on chronic plaquinil, prednisone 13m), who was in hospital 04-2018 with shock due to hypovolemia, adrenal insufficiency. His course  was complicated by L basilar gangliar stroke and stage II buttocks ulcer. He was d/c to SNF. States he was bed-bound while there.  He returns on 7-8 with temp 101.4, WBC of 9.4 and lactate 2.54. He was found on CT to have a 5.9 x 4.7 cm soft tissue collection caudal to the r ischial tuberosity. There was concern for necrotizing fascitis and osteomyelitis on CT.  He required neo-synephrine, was started on vanco/clinda/zosyn and underwent debridement on 7-8.  He was felt to have R buttock abscess, decubitus ulcer and probable osteo.  He was transferred out of ICU today and to regular floor.  His BCx are ngtd His g/s from abscess is GNR and GPC  Review of Systems: Review of Systems  Constitutional: Negative for chills and fever.  Eyes: Negative for blurred vision and double vision.  Respiratory: Negative for cough and shortness of breath.   Gastrointestinal: Negative for constipation and diarrhea.  Genitourinary: Negative for dysuria.  Please see HPI. All other systems reviewed and negative.   Past Medical History:  Diagnosis Date  . AAA (abdominal aortic aneurysm) ([facility]   . AKI (acute kidney injury) ([facility]    a. Cr up to 8 in 04/2018.  . Arthritis   . CAD in native artery    a. h/o MI x 3 s/p CABG x 4 (1993 with GServando Snare. b. LHC 01/2017 with  severe native disease, 2 grafts occluded.  . Carotid artery disease ([place])    a. s/p R CEA 11/2016.  [name] Chronic combined systolic and diastolic CHF (congestive heart failure) ([place])   . GERD (gastroesophageal reflux disease)   . Hyperkalemia   . ILD (interstitial lung disease) ([place])   . Lung nodule   . MI (myocardial infarction) ([place])    x 3   . Normocytic anemia   . NSVT (nonsustained ventricular tachycardia) ([place])   . Pancreatitis    1/19  . Pneumonia 11/2017  . RA (rheumatoid arthritis) ([place])   . Stroke ([place])    a. 2018. b. possible recurrence in 04/2018, seen by neuro  . Tobacco abuse     Social History   Tobacco Use  . Smoking status:  Current Every Day Smoker    Packs/day: 0.50    Years: 47.00    Pack years: 23.50    Types: Cigarettes  . Smokeless tobacco: Never Used  . Tobacco comment: half pack every other day  Substance Use Topics  . Alcohol use: No    Comment: remote history of beer drinking  . Drug use: No    Family History  Problem Relation Age of Onset  . Heart murmur Mother   . Heart disease Mother   . Heart attack Mother   . Suicidality Father   . Heart attack Father   . Stroke Brother   . Heart attack Brother   . Heart attack Maternal Grandmother   . Cancer Maternal Grandmother        unknown type  . Lung cancer Maternal Aunt   . Lung cancer Maternal Uncle   . Cancer Maternal Aunt        unknown cancer  . Heart attack Brother      Medications:  Scheduled: . atorvastatin  40 mg Oral q1800  . carvedilol  3.125 mg Oral BID WC  . enoxaparin (LOVENOX) injection  40 mg Subcutaneous Q24H  . feeding supplement (PRO-STAT SUGAR FREE 64)  30 mL Oral BID  . gabapentin  300 mg Oral Q8H  . hydrocortisone sodium succinate  50 mg Intravenous Q6H  . ipratropium-albuterol  3 mL Nebulization Q6H  . multivitamin with minerals  1 tablet Oral Daily  . nicotine  7 mg Transdermal Daily  . nutrition supplement (JUVEN)  1 packet Oral BID BM  . saccharomyces boulardii  250 mg Oral BID    Abtx:  Anti-infectives (From admission, onward)   Start     Dose/Rate Route Frequency Ordered Stop   05/10/18 0600  vancomycin (VANCOCIN) 1,250 mg in sodium chloride 0.9 % 250 mL IVPB     1,250 mg 166.7 mL/hr over 90 Minutes Intravenous Every 12 hours 05/09/18 1724     05/09/18 2200  piperacillin-tazobactam (ZOSYN) IVPB 3.375 g  Status:  Discontinued     3.375 g 100 mL/hr over 30 Minutes Intravenous  Once 05/09/18 2156 05/09/18 2202   05/09/18 2200  clindamycin (CLEOCIN) IVPB 600 mg     600 mg 100 mL/hr over 30 Minutes Intravenous Every 8 hours 05/09/18 2156     05/09/18 2200  piperacillin-tazobactam (ZOSYN) IVPB 3.375 g      3.375 g 12.5 mL/hr over 240 Minutes Intravenous Every 8 hours 05/09/18 1717     05/09/18 1600  clindamycin (CLEOCIN) IVPB 600 mg     600 mg 100 mL/hr over 30 Minutes Intravenous  Once 05/09/18 1551 05/09/18 1657   05/09/18 1430  piperacillin-tazobactam (ZOSYN) IVPB 3.375  g     3.375 g 100 mL/hr over 30 Minutes Intravenous  Once 05/09/18 1420 05/09/18 1542   05/09/18 1430  vancomycin (VANCOCIN) IVPB 1000 mg/200 mL premix  Status:  Discontinued     1,000 mg 200 mL/hr over 60 Minutes Intravenous  Once 05/09/18 1420 05/09/18 1426   05/09/18 1430  vancomycin (VANCOCIN) 2,000 mg in sodium chloride 0.9 % 500 mL IVPB     2,000 mg 250 mL/hr over 120 Minutes Intravenous STAT 05/09/18 1426 05/09/18 1750        OBJECTIVE: Blood pressure 115/75, pulse 77, temperature 97.7 F (36.5 C), temperature source Oral, resp. rate 12, height _0  (1.854 m), weight 90.2 kg (198 lb 13.7 oz), SpO2 99 %.  Physical Exam  Constitutional: No distress.  HENT:  Mouth/Throat: No oropharyngeal exudate.  Eyes: Pupils are equal, round, and reactive to light. EOM are normal.  Neck: Neck supple.  Cardiovascular: Normal rate, regular rhythm and normal heart sounds.  Pulmonary/Chest: Effort normal and breath sounds normal.  Abdominal: Soft. Bowel sounds are normal. He exhibits distension.  Musculoskeletal: He exhibits no edema.       Back:  Lymphadenopathy:    He has no cervical adenopathy.  Neurological: He is alert.  Skin: He is not diaphoretic.    Lab Results Results for orders placed or performed during the hospital encounter of 05/09/18 (from the past 48 hour(s))  Urinalysis, Routine w reflex microscopic     Status: None   Collection Time: 05/09/18  3:46 PM  Result Value Ref Range   Color, Urine YELLOW YELLOW   APPearance CLEAR CLEAR   Specific Gravity, Urine 1.014 1.005 - 1.030   pH 5.0 5.0 - 8.0   Glucose, UA NEGATIVE NEGATIVE mg/dL   Hgb urine dipstick NEGATIVE NEGATIVE   Bilirubin Urine  NEGATIVE NEGATIVE   Ketones, ur NEGATIVE NEGATIVE mg/dL   Protein, ur NEGATIVE NEGATIVE mg/dL   Nitrite NEGATIVE NEGATIVE   Leukocytes, UA NEGATIVE NEGATIVE    Comment: Performed at [facility], [place] [address] [place], [place] [address]  Urine culture     Status: Abnormal   Collection Time: 05/09/18  3:46 PM  Result Value Ref Range   Specimen Description      URINE, RANDOM Performed at [facility] [address], [place], [place] [address]    Special Requests      NONE Performed at [facility], [place] [address] [place], [place] [address]    Culture (A)     <10,000 COLONIES/mL INSIGNIFICANT GROWTH Performed at [facility] [address] [place], [place] [address]    Report Status 05/10/2018 FINAL   Aerobic/Anaerobic Culture (surgical/deep wound)     Status: None (Preliminary result)   Collection Time: 05/09/18  8:34 PM  Result Value Ref Range   Specimen Description      ABSCESS Performed at [facility] [address] [place], [place] [address]    Special Requests      PERIRECTAL Performed at [facility], [place] [address] [place], [place] [address]    Gram Stain      ABUNDANT WBC PRESENT, PREDOMINANTLY PMN MODERATE GRAM POSITIVE COCCI FEW GRAM NEGATIVE RODS    Culture      CULTURE REINCUBATED FOR BETTER GROWTH Performed at [facility], [place] [address], [place], [place] [address]    Report Status PENDING   MRSA PCR Screening     Status: None  Collection Time: 05/10/18 12:32 AM  Result Value Ref Range   MRSA by PCR NEGATIVE NEGATIVE    Comment:        The GeneXpert MRSA Assay (FDA approved for NASAL specimens only), is one component of a comprehensive MRSA colonization surveillance program. It is not intended to diagnose MRSA infection nor to guide or monitor treatment for MRSA infections. Performed at [facility], [place]  [address] [place], [place] [address]   CBC     Status: Abnormal   Collection Time: 05/10/18  3:58 AM  Result Value Ref Range   WBC 9.8 4.0 - 10.5 K/uL   RBC 3.44 (L) 4.22 - 5.81 MIL/uL   Hemoglobin 9.4 (L) 13.0 - 17.0 g/dL   HCT 30.1 (L) 39.0 - 52.0 %   MCV 87.5 78.0 - 100.0 fL   MCH 27.3 26.0 - 34.0 pg   MCHC 31.2 30.0 - 36.0 g/dL   RDW 15.7 (H) 11.5 - 15.5 %   Platelets 161 150 - 400 K/uL    Comment: Performed at [facility], [place] [address] [place], [place] [address]  Basic metabolic panel     Status: Abnormal   Collection Time: 05/10/18  3:58 AM  Result Value Ref Range   Sodium 137 135 - 145 mmol/L   Potassium 4.4 3.5 - 5.1 mmol/L   Chloride 107 98 - 111 mmol/L    Comment: Please note change in reference range.   CO2 23 22 - 32 mmol/L   Glucose, Bld 192 (H) 70 - 99 mg/dL    Comment: Please note change in reference range.   BUN 16 8 - 23 mg/dL    Comment: Please note change in reference range.   Creatinine, Ser 0.75 0.61 - 1.24 mg/dL   Calcium 7.8 (L) 8.9 - 10.3 mg/dL   GFR calc non Af Amer >60 >60 mL/min   GFR calc Af Amer >60 >60 mL/min    Comment: (NOTE) The eGFR has been calculated using the CKD EPI equation. This calculation has not been validated in all clinical situations. eGFR's persistently <60 mL/min signify possible Chronic Kidney Disease.    Anion gap 7 5 - 15    Comment: Performed at [facility], [place] [address] [place], [place] [address]  CBC     Status: Abnormal   Collection Time: 05/11/18  4:12 AM  Result Value Ref Range   WBC 7.0 4.0 - 10.5 K/uL   RBC 3.06 (L) 4.22 - 5.81 MIL/uL   Hemoglobin 8.5 (L) 13.0 - 17.0 g/dL   HCT 27.1 (L) 39.0 - 52.0 %   MCV 88.6 78.0 - 100.0 fL   MCH 27.8 26.0 - 34.0 pg   MCHC 31.4 30.0 - 36.0 g/dL   RDW 15.5 11.5 - 15.5 %   Platelets 200 150 - 400 K/uL    Comment: Performed at [facility], [place] [address], [place], [place] [address]  Basic metabolic panel      Status: Abnormal   Collection Time: 05/11/18  4:12 AM  Result Value Ref Range   Sodium 136 135 - 145 mmol/L   Potassium 4.8 3.5 - 5.1 mmol/L   Chloride 106 98 - 111 mmol/L    Comment: Please note change in reference range.   CO2 24 22 - 32 mmol/L   Glucose, Bld 209 (H) 70 - 99 mg/dL    Comment: Please note change in reference range.   BUN 18 8 - 23  mg/dL    Comment: Please note change in reference range.   Creatinine, Ser 0.66 0.61 - 1.24 mg/dL   Calcium 7.9 (L) 8.9 - 10.3 mg/dL   GFR calc non Af Amer >60 >60 mL/min   GFR calc Af Amer >60 >60 mL/min    Comment: (NOTE) The eGFR has been calculated using the CKD EPI equation. This calculation has not been validated in all clinical situations. eGFR's persistently <60 mL/min signify possible Chronic Kidney Disease.    Anion gap 6 5 - 15    Comment: Performed at [facility], [place] [address], [place], [place] [address]  Lactic acid, plasma     Status: None   Collection Time: 05/11/18  8:22 AM  Result Value Ref Range   Lactic Acid, Venous 1.7 0.5 - 1.9 mmol/L    Comment: Performed at [facility], [place] [address] [place], [place] [address]      Component Value Date/Time   SDES  05/09/2018 2034    ABSCESS Performed at [facility], [place] [address] [place], [place] [address]    SPECREQUEST  05/09/2018 2034    PERIRECTAL Performed at [facility], [place] [address] [place], [place] [address]    CULT  05/09/2018 2034    CULTURE REINCUBATED FOR BETTER GROWTH Performed at [facility] [address] [place], [place] [address]    REPTSTATUS PENDING 05/09/2018 2034   Ct Pelvis W Contrast  Result Date: 05/09/2018 CLINICAL DATA:  Buttock pain and fever. Sepsis muscle likely from buttock/perineal and cellulitis. Concern for possible necrotizing fasciitis. EXAM: CT PELVIS WITH CONTRAST TECHNIQUE: Multidetector CT imaging of the pelvis was performed using the  standard protocol following the bolus administration of intravenous contrast. CONTRAST:  126m ISOVUE-300 IOPAMIDOL (ISOVUE-300) INJECTION 61% COMPARISON:  08/08/2017 CT FINDINGS: Urinary Tract: Physiologic distention of the urinary bladder is noted. The distal ureters are unremarkable. Bowel: Normal appearing included appendix. Scattered colonic diverticulosis along the distal descending and sigmoid colon without acute diverticulitis. Mechanical bowel obstruction or inflammation. 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 Atherosclerosis with ectasia of the left common iliac artery 1.5 cm. No inguinal, pelvic sidewall, retroperitoneal or iliac chain adenopathy. Reproductive:  Normal size prostate and seminal vesicles. Other:  No free air nor free fluid. Musculoskeletal: Cellulitis of the medial right buttock with mottled peripherally enhancing soft tissue abscess or stigmata necrotizing fasciitis just caudad to the right ischium in the expected location of the distal right semimembranosus, semitendinosus, rectus femoris and quadratus femoris muscles and tendons. This collection contains mottled foci of gas and is estimated at 5.9 x 4.8 cm on series 3/130. The craniocaudad extent is difficult to ascertain on the reformats as it is incompletely included. Cortical bone loss is noted of the right ischial tuberosity is concerning for changes of osteomyelitis. IMPRESSION: 1. Abnormal enhancing soft tissue collection centered just caudad to the right ischial tuberosity measuring at least 5.9 x 4.7 cm hand AP by transverse dimension and involving the distal right semimembranosus, semitendinosus, rectus femoris and quadratus femoris muscles and tendons. This raises concern for soft tissue abscess and/or stigmata of necrotizing fasciitis. 2. Cortical bone loss of the right ischial tuberosity also raises concern changes of osteomyelitis. 3. 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 4. Ectasia of the left common iliac artery to 1.5 cm. Electronically Signed   By: DAshley RoyaltyM.D.   On: 05/09/2018 15:59  Recent Results (from the past 240 hour(s))  Culture, blood (Routine x 2)     Status: None (Preliminary result)   Collection Time: 05/09/18  1:32 PM  Result Value Ref Range Status   Specimen Description   Final    BLOOD RIGHT HAND Performed at [facility] [address] [place], [place] [address]    Special Requests   Final    BOTTLES DRAWN AEROBIC AND ANAEROBIC Blood Culture adequate volume Performed at [facility] [address] [place], [place] [address]    Culture   Final    NO GROWTH 2 DAYS Performed at [facility] [address], [place], [place] [address]    Report Status PENDING  Incomplete  Culture, blood (Routine x 2)     Status: None (Preliminary result)   Collection Time: 05/09/18  1:35 PM  Result Value Ref Range Status   Specimen Description   Final    BLOOD RIGHT FOREARM Performed at [facility] [address], [place], [place] [address]    Special Requests   Final    BOTTLES DRAWN AEROBIC AND ANAEROBIC Blood Culture adequate volume Performed at [facility] [address], [place], [place] [address]    Culture   Final    NO GROWTH 2 DAYS Performed at [facility] [address], [place], [place] [address]    Report Status PENDING  Incomplete  Urine culture     Status: Abnormal   Collection Time: 05/09/18  3:46 PM  Result Value Ref Range Status   Specimen Description   Final    URINE, RANDOM Performed at [facility] [address], [place], [place] [address]    Special Requests   Final    NONE Performed at [facility], [place] [address] [place], [place] [address]    Culture (A)  Final    <10,000 COLONIES/mL INSIGNIFICANT GROWTH Performed at [facility] [address], [place], [place] [address]    Report Status 05/10/2018 FINAL  Final  Aerobic/Anaerobic Culture (surgical/deep wound)     Status: None (Preliminary result)   Collection Time: 05/09/18  8:34 PM  Result Value Ref Range Status   Specimen Description   Final    ABSCESS Performed at [facility] [address] [place], [place] [address]    Special Requests   Final    PERIRECTAL Performed at [facility] [place] [address] [place], [place] [address]    Gram Stain   Final    ABUNDANT WBC PRESENT, PREDOMINANTLY PMN MODERATE GRAM POSITIVE COCCI FEW GRAM NEGATIVE RODS    Culture   Final    CULTURE REINCUBATED FOR BETTER GROWTH Performed at [facility], [place] [address], [place],  [address]    Report Status PENDING  Incomplete  MRSA PCR Screening     Status: None   Collection Time: 05/10/18 12:32 AM  Result Value Ref Range Status   MRSA by PCR NEGATIVE NEGATIVE Final    Comment:        The GeneXpert MRSA Assay (FDA approved for NASAL specimens only), is one component of a comprehensive MRSA colonization surveillance program. It is not intended to diagnose MRSA infection nor to guide or monitor treatment for MRSA infections. Performed at [facility], [place] [address], [place],  [address]     Microbiology: Recent Results (from the past 240  hour(s))  Culture, blood (Routine x 2)     Status: None (Preliminary result)   Collection Time: 05/09/18  1:32 PM  Result Value Ref Range Status   Specimen Description   Final    BLOOD RIGHT HAND Performed at [facility] [address] [place], [place] [address]    Special Requests   Final    BOTTLES DRAWN AEROBIC AND ANAEROBIC Blood Culture adequate volume Performed at [facility] [address], [place], [place] [address]    Culture   Final    NO GROWTH 2 DAYS Performed at [facility] [address] [place], [place] [address]    Report Status PENDING  Incomplete  Culture, blood (Routine x 2)     Status: None (Preliminary result)   Collection Time: 05/09/18  1:35 PM  Result Value Ref Range Status   Specimen Description   Final    BLOOD RIGHT FOREARM Performed at [facility] [address] [place], [place] [address]    Special Requests   Final    BOTTLES DRAWN AEROBIC AND ANAEROBIC Blood Culture adequate volume Performed at [facility] [address] [place], [place] [address]    Culture   Final    NO GROWTH 2 DAYS Performed at [facility] [address] [place], [place] [address]    Report Status PENDING  Incomplete  Urine culture     Status: Abnormal   Collection Time: 05/09/18  3:46 PM  Result Value Ref Range Status   Specimen Description   Final    URINE, RANDOM Performed at [facility] [address] [place], [place] [address]    Special Requests   Final    NONE Performed at [facility], [place] [address] [place], [place] [address]    Culture (A)  Final    <10,000 COLONIES/mL INSIGNIFICANT GROWTH Performed at [facility] [address] [place], [place] [address]    Report Status 05/10/2018 FINAL  Final  Aerobic/Anaerobic Culture (surgical/deep wound)     Status: None (Preliminary result)   Collection Time: 05/09/18  8:34 PM  Result Value Ref Range Status   Specimen Description   Final    ABSCESS Performed at [facility] [address], [place], [place] [address]    Special Requests   Final    PERIRECTAL Performed at [facility], [place] [address], [place], [place] [address]    Gram Stain   Final    ABUNDANT WBC PRESENT, PREDOMINANTLY PMN MODERATE GRAM POSITIVE COCCI FEW GRAM NEGATIVE RODS    Culture   Final    CULTURE REINCUBATED FOR BETTER GROWTH Performed at [facility], [place] [address] [place], [place]  [address]    Report Status PENDING  Incomplete  MRSA PCR Screening     Status: None   Collection Time: 05/10/18 12:32 AM  Result Value Ref Range Status   MRSA by PCR NEGATIVE NEGATIVE Final    Comment:        The GeneXpert MRSA Assay (FDA approved for NASAL specimens only), is one component of a comprehensive MRSA colonization surveillance program. It is not intended to diagnose MRSA infection nor to guide or monitor treatment for MRSA infections. Performed at [facility], [place] [address], [place], [place] [address]     Radiographs and labs were personally reviewed by me.   Bobby Rumpf,  MD [facility] [phone] 05/11/2018, 3:31 PM

## 2018-05-11 NOTE — Progress Notes (Signed)
[facility] Progress Note  2 Days Post-Op  Subjective: CC: wound Patient states dressing was not changed again overnight. Moderate pain in R buttock. Tolerating diet, denies nausea. Passing flatus, no BM.   Objective: Vital signs in last 24 hours: Temp:  [97.7 F (36.5 C)] 97.7 F (36.5 C) (07/10 0554) Pulse Rate:  [70-81] 70 (07/10 0554) Resp:  [12-21] 12 (07/10 0554) BP: (103-114)/(63-79) 104/71 (07/10 0554) SpO2:  [94 %-100 %] 96 % (07/10 0554) Weight:  [90.2 kg (198 lb 13.7 oz)] 90.2 kg (198 lb 13.7 oz) (07/10 0554) Last BM Date: (PTA)  Intake/Output from previous day: 07/09 0701 - 07/10 0700 In: 5718.5 [P.O.:720; I.V.:2416.7; IV Piggyback:2581.8] Out: 2500 [Urine:2500] Intake/Output this shift: No intake/output data recorded.  PE: Gen:  Alert, NAD, pleasant Card:  Regular rate and rhythm, pedal pulses 2+ BL Pulm:  Normal effort, clear to auscultation bilaterally Abd: Soft, non-tender, non-distended, bowel sounds present GU:  R gluteal wound as above with minimal bleeding and some necrotic tissue around tendons Psych: A&Ox3   Lab Results:  Recent Labs    05/10/18 0358 05/11/18 0412  WBC 9.8 7.0  HGB 9.4* 8.5*  HCT 30.1* 27.1*  PLT 161 200   BMET Recent Labs    05/10/18 0358 05/11/18 0412  NA 137 136  K 4.4 4.8  CL 107 106  CO2 23 24  GLUCOSE 192* 209*  BUN 16 18  CREATININE 0.75 0.66  CALCIUM 7.8* 7.9*   PT/INR Recent Labs    05/09/18 1332  LABPROT 13.9  INR 1.08   CMP     Component Value Date/Time   NA 136 05/11/2018 0412   NA 142 02/08/2018 1422   K 4.8 05/11/2018 0412   CL 106 05/11/2018 0412   CO2 24 05/11/2018 0412   GLUCOSE 209 (H) 05/11/2018 0412   BUN 18 05/11/2018 0412   BUN 12 02/08/2018 1422   CREATININE 0.66 05/11/2018 0412   CALCIUM 7.9 (L) 05/11/2018 0412   PROT 6.5 05/09/2018 1332   PROT 7.5 08/26/2017 1200   ALBUMIN 2.2 (L) 05/09/2018 1332   ALBUMIN 4.1 08/26/2017 1200   AST 30 05/09/2018 1332   ALT  29 05/09/2018 1332   ALKPHOS 127 (H) 05/09/2018 1332   BILITOT 0.8 05/09/2018 1332   BILITOT 0.9 08/26/2017 1200   GFRNONAA >60 05/11/2018 0412   GFRAA >60 05/11/2018 0412   Lipase     Component Value Date/Time   LIPASE 26 11/01/2017 1410       Studies/Results: Dg Chest 2 View  Result Date: 05/09/2018 CLINICAL DATA:  Fever. Buttock wound. History of interstitial lung disease. EXAM: CHEST - 2 VIEW COMPARISON:  Chest radiograph April 06, 2018 FINDINGS: Cardiac silhouette is moderately enlarged unchanged. Status post median sternotomy for CABG with multiple fractured sternotomy wires. Similar chronic interstitial changes without pleural effusion or focal consolidation. No pneumothorax. Old LEFT rib fractures. IMPRESSION: 1. Stable interstitial prominence consistent with history of interstitial lung disease. 2. Stable cardiomegaly. Electronically Signed   By: Awilda Metro M.D.   On: 05/09/2018 14:46   Ct Pelvis W Contrast  Result Date: 05/09/2018 CLINICAL DATA:  Buttock pain and fever. Sepsis muscle likely from buttock/perineal and cellulitis. Concern for possible necrotizing fasciitis. EXAM: CT PELVIS WITH CONTRAST TECHNIQUE: Multidetector CT imaging of the pelvis was performed using the standard protocol following the bolus administration of intravenous contrast. CONTRAST:  ISOVUE-300 IOPAMIDOL (ISOVUE-300) INJECTION 61% COMPARISON:  08/08/2017 CT FINDINGS: Urinary Tract: Physiologic distention of the urinary  bladder is noted. The distal ureters are unremarkable. Bowel: Normal appearing included appendix. Scattered colonic diverticulosis along the distal descending and sigmoid colon without acute diverticulitis. Mechanical bowel obstruction or inflammation. 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 Atherosclerosis with ectasia of the left common iliac artery 1.5 cm. No inguinal, pelvic sidewall, retroperitoneal or iliac chain adenopathy.  Reproductive:  Normal size prostate and seminal vesicles. Other:  No free air nor free fluid. Musculoskeletal: Cellulitis of the medial right buttock with mottled peripherally enhancing soft tissue abscess or stigmata necrotizing fasciitis just caudad to the right ischium in the expected location of the distal right semimembranosus, semitendinosus, rectus femoris and quadratus femoris muscles and tendons. This collection contains mottled foci of gas and is estimated at 5.9 x 4.8 cm on series 3/130. The craniocaudad extent is difficult to ascertain on the reformats as it is incompletely included. Cortical bone loss is noted of the right ischial tuberosity is concerning for changes of osteomyelitis. IMPRESSION: 1. Abnormal enhancing soft tissue collection centered just caudad to the right ischial tuberosity measuring at least 5.9 x 4.7 cm hand AP by transverse dimension and involving the distal right semimembranosus, semitendinosus, rectus femoris and quadratus femoris muscles and tendons. This raises concern for soft tissue abscess and/or stigmata of necrotizing fasciitis. 2. Cortical bone loss of the right ischial tuberosity also raises concern changes of osteomyelitis. 3. 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 4. Ectasia of the left common iliac artery to 1.5 cm. Electronically Signed   By: Tollie Eth M.D.   On: 05/09/2018 15:59    Anti-infectives: Anti-infectives (From admission, onward)   Start     Dose/Rate Route Frequency Ordered Stop   05/10/18 0600  vancomycin (VANCOCIN) 1,250 mg in sodium chloride 0.9 % 250 mL IVPB     1,250 mg 166.7 mL/hr over 90 Minutes Intravenous Every 12 hours 05/09/18 1724     05/09/18 2200  piperacillin-tazobactam (ZOSYN) IVPB 3.375 g  Status:  Discontinued     3.375 g 100 mL/hr over 30 Minutes Intravenous  Once 05/09/18 2156 05/09/18 2202   05/09/18 2200  clindamycin (CLEOCIN) IVPB 600 mg     600 mg 100 mL/hr over 30 Minutes Intravenous  Every 8 hours 05/09/18 2156     05/09/18 2200  piperacillin-tazobactam (ZOSYN) IVPB 3.375 g     3.375 g 12.5 mL/hr over 240 Minutes Intravenous Every 8 hours 05/09/18 1717     05/09/18 1600  clindamycin (CLEOCIN) IVPB 600 mg     600 mg 100 mL/hr over 30 Minutes Intravenous  Once 05/09/18 1551 05/09/18 1657   05/09/18 1430  piperacillin-tazobactam (ZOSYN) IVPB 3.375 g     3.375 g 100 mL/hr over 30 Minutes Intravenous  Once 05/09/18 1420 05/09/18 1542   05/09/18 1430  vancomycin (VANCOCIN) IVPB 1000 mg/200 mL premix  Status:  Discontinued     1,000 mg 200 mL/hr over 60 Minutes Intravenous  Once 05/09/18 1420 05/09/18 1426   05/09/18 1430  vancomycin (VANCOCIN) 2,000 mg in sodium chloride 0.9 % 500 mL IVPB     2,000 mg 250 mL/hr over 120 Minutes Intravenous STAT 05/09/18 1426 05/09/18 1750       Assessment/Plan History of adrenal insufficiency, with hypotension shock-hospitalized 6/3-6/09/2018 Left basal ganglia and posterior left centrum ovale stroke 04/10/2018- On Plavix Interstitial lung disease secondary to rheumatoid arthritis and mold exposure Chronic respiratory failure previously on home O2 History of COPD with tobacco use-discontinued smoking during his last  hospitalization. History of CABG 1993 Dr. 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 Gerrit Friends  - POD#2  - WBC 7.0, afebrile  - cxs pending - continue BS IV abx, recommend ID consult for guidance in antibiotic therapy - dressing changes BID with saline moistened kerlix  FEN: reg diet VTE: SCDs, lovenox ID: [email or web address] 7/8 >>    LOS: 2 days    Wells Guiles , [facility] 05/11/2018, 10:15 AM Pager: [phone] Consults: [phone] Mon-Fri 7:00 am-4:30 pm Sat-Sun 7:00 am-11:30 am

## 2018-05-11 NOTE — Progress Notes (Signed)
PROGRESS NOTE    [name]  EKC:[id number] DOB: 28-Apr-1956 DOA: 05/09/2018 PCP: [name], PA-C    Brief Narrative:  62 year old male well-known to the [place] pulmonary critical care medicine service because of his known COPD interstitial lung disease and connective tissue disease with chronic immunosuppression who returns to our facility complaining of right sided buttock pain.  He has had a wound which is being received dressing at his nursing facility for the last 2 weeks.  He says it has been bleeding intermittently and they have been giving him wound dressing changes there.  Today it bled more so they sent him here for further evaluation.  He had a CT scan which showed evidence of necrotizing skin infection so pulmonary and critical care medicine was asked to admit and surgery was consulted.  Patient seen by general surgery and subsequently underwent I and D on 05/19/2018 with cultures pending.  Patient placed empirically on IV vancomycin, IV Zosyn, IV clindamycin.  Patient subsequently transferred from the ICU and placed on telemetry.      Assessment & Plan:   Principal Problem:   Abscess of buttock Active Problems:   CAD (coronary artery disease)   ILD (interstitial lung disease) (HCC)   Anemia   HTN (hypertension)   S/P CABG (coronary artery bypass graft)   Chronic combined systolic and diastolic CHF (congestive heart failure) (HCC)   Hx of rheumatoid arthritis   Rheumatoid arthritis (HCC)   Chronic obstructive pulmonary disease (HCC)   Supplemental oxygen dependent   AAA (abdominal aortic aneurysm) without rupture (HCC)   History of CVA with residual deficit   Adrenal insufficiency (HCC): Relative  #1 right buttocks abscess with possible osteomyelitis Status post I&D 05/09/2018 per Dr. Gerrit Friends.  Patient currently afebrile.  Normal white count.  Cultures were obtained which are currently pending.  Continue dressing changes with saline moist Kerlix twice daily  per general surgery recommendations.  Continue empiric IV vancomycin, IV Zosyn, IV clindamycin.  Consult with ID for further evaluation and management and antibiotic recommendations and duration.  Follow.  2.  Chronic respiratory failure on home O2/COPD/interstitial lung disease Currently stable.  Immunosuppression on hold.  Continue nicotine patch and scheduled duo nebs.  Outpatient follow-up with pulmonary.  3.  Hypertension Blood pressure stable.  On stress dose steroids.  Likely start weaning stress dose steroids tomorrow.  Saline lock IV fluids.  Follow.  4.  Rheumatoid arthritis Currently on stress dose steroids.  Will likely taper down back to home dose.  5.  Relative adrenal insufficiency Currently on stress dose steroids.  Will start to taper IV Solu-Cortef tomorrow.  Monitor blood pressure.  Follow.  6.  History of recent left basal ganglia and posterior left centrum ovale CVA 04/10/2018. Plavix currently on hold.  Likely resume Plavix tomorrow if okay with general surgery.  7.  AAA Stable.  Outpatient follow-up.  8.  Chronic systolic and diastolic CHF EF 30 to 35% Stable.  Saline lock IV fluids.  Monitor closely for volume overload.  Resume Lipitor, Coreg.  Follow.  9.  Chronic anemia No bleeding.  Follow H&H.     DVT prophylaxis: Lovenox Code Status: Full Family Communication: Updated patient and wife at bedside. Disposition Plan: To be determined.   Consultants:   General surgery: Dr. Gerrit Friends 05/09/2018  PCCM admission  Procedures:   CT pelvis 05/19/2018  Chest x-ray 05/09/2018  I&D 05/09/2018 per Dr. Gerrit Friends  Antimicrobials:   IV clindamycin 05/09/2018  IV Zosyn 05/09/2018  IV  bank 05/09/2018   Subjective: Patient laying in bed.  Improvement with right hip pain.  No nausea or vomiting.  No shortness of breath or chest pain.  Feeling better.  Passing flatus.  Objective: Vitals:   05/11/18 0201 05/11/18 0554 05/11/18 1039 05/11/18 1334  BP:  104/71  115/75    Pulse:  70  77  Resp:  12    Temp:  97.7 F (36.5 C)  97.7 F (36.5 C)  TempSrc:  Oral  Oral  SpO2: 94% 96% 97% 99%  Weight:  90.2 kg (198 lb 13.7 oz)    Height:        Intake/Output Summary (Last 24 hours) at 05/11/2018 1530 Last data filed at 05/11/2018 1351 Gross per 24 hour  Intake 4180.17 ml  Output 3300 ml  Net 880.17 ml   Filed Weights   05/09/18 1307 05/10/18 0500 05/11/18 0554  Weight: 90.3 kg (199 lb) 87.2 kg (192 lb 3.9 oz) 90.2 kg (198 lb 13.7 oz)    Examination:  General exam: Appears calm and comfortable  Respiratory system: Bibasilar crackles. Respiratory effort normal. Cardiovascular system: S1 & S2 heard, RRR. No JVD, murmurs, rubs, gallops or clicks. No pedal edema. Gastrointestinal system: Abdomen is nondistended, soft and nontender. No organomegaly or masses felt. Normal bowel sounds heard. Central nervous system: Alert and oriented. No focal neurological deficits. Extremities: Symmetric 5 x 5 power. Skin: R gluteal wound with packing. Psychiatry: Judgement and insight appear normal. Mood & affect appropriate.     Data Reviewed: I have personally reviewed following labs and imaging studies  CBC: Recent Labs  Lab 05/09/18 1332 05/10/18 0358 05/11/18 0412  WBC 9.4 9.8 7.0  NEUTROABS 7.8*  --   --   HGB 11.0* 9.4* 8.5*  HCT 34.7* 30.1* 27.1*  MCV 86.1 87.5 88.6  PLT 167 161 200   Basic Metabolic Panel: Recent Labs  Lab 05/09/18 1332 05/10/18 0358 05/11/18 0412  NA 133* 137 136  K 4.0 4.4 4.8  CL 99 107 106  CO2 24 23 24   GLUCOSE 178* 192* 209*  BUN 19 16 18   CREATININE 0.77 0.75 0.66  CALCIUM 8.0* 7.8* 7.9*   GFR: Estimated Creatinine Clearance: 108.2 mL/min (by C-G formula based on SCr of 0.66 mg/dL). Liver Function Tests: Recent Labs  Lab 05/09/18 1332  AST 30  ALT 29  ALKPHOS 127*  BILITOT 0.8  PROT 6.5  ALBUMIN 2.2*   No results for input(s): LIPASE, AMYLASE in the last 168 hours. No results for input(s): AMMONIA in  the last 168 hours. Coagulation Profile: Recent Labs  Lab 05/09/18 1332  INR 1.08   Cardiac Enzymes: No results for input(s): CKTOTAL, CKMB, CKMBINDEX, TROPONINI in the last 168 hours. BNP (last 3 results) No results for input(s): PROBNP in the last 8760 hours. HbA1C: No results for input(s): HGBA1C in the last 72 hours. CBG: No results for input(s): GLUCAP in the last 168 hours. Lipid Profile: No results for input(s): CHOL, HDL, LDLCALC, TRIG, CHOLHDL, LDLDIRECT in the last 72 hours. Thyroid Function Tests: No results for input(s): TSH, T4TOTAL, FREET4, T3FREE, THYROIDAB in the last 72 hours. Anemia Panel: No results for input(s): VITAMINB12, FOLATE, FERRITIN, TIBC, IRON, RETICCTPCT in the last 72 hours. Sepsis Labs: Recent Labs  Lab 05/09/18 1329 05/09/18 1457 05/11/18 0822  LATICACIDVEN 2.54* 2.28* 1.7    Recent Results (from the past 240 hour(s))  Culture, blood (Routine x 2)     Status: None (Preliminary result)   Collection Time:  05/09/18  1:32 PM  Result Value Ref Range Status   Specimen Description   Final    BLOOD RIGHT HAND Performed at [facility], [address] [place], [place] [address]    Special Requests   Final    BOTTLES DRAWN AEROBIC AND ANAEROBIC Blood Culture adequate volume Performed at [facility], [address], [place], [place] [address]    Culture   Final    NO GROWTH 2 DAYS Performed at [facility], [address] [place], [place] [address]    Report Status PENDING  Incomplete  Culture, blood (Routine x 2)     Status: None (Preliminary result)   Collection Time: 05/09/18  1:35 PM  Result Value Ref Range Status   Specimen Description   Final    BLOOD RIGHT FOREARM Performed at [facility], [address] [place], [place] [address]    Special Requests   Final    BOTTLES DRAWN AEROBIC AND ANAEROBIC Blood Culture adequate volume Performed at [facility], [address], [place], [place] [address]    Culture   Final    NO GROWTH 2 DAYS Performed at [facility], [address], [place], [place] [address]    Report Status PENDING  Incomplete  Urine culture     Status: Abnormal   Collection Time: 05/09/18  3:46 PM  Result Value Ref Range Status   Specimen Description   Final    URINE, RANDOM Performed at [facility], [address] [place], [place] [address]    Special Requests   Final    NONE Performed at [facility], [address] [place], [place] [address]    Culture (A)  Final    <10,000 COLONIES/mL INSIGNIFICANT GROWTH Performed at [facility], [address] [place], [place] [address]    Report Status 05/10/2018 FINAL  Final  Aerobic/Anaerobic Culture (surgical/deep wound)     Status: None (Preliminary result)   Collection Time: 05/09/18  8:34 PM  Result Value Ref Range Status   Specimen Description   Final    ABSCESS Performed at [facility], [address], [place], [place] [address]    Special Requests   Final    PERIRECTAL Performed at [facility], [address], [place], [place] [address]    Gram Stain   Final    ABUNDANT WBC PRESENT, PREDOMINANTLY PMN MODERATE GRAM POSITIVE COCCI FEW GRAM NEGATIVE RODS    Culture   Final    CULTURE REINCUBATED FOR BETTER GROWTH Performed at [facility], [address] [place], [place] [address]    Report Status PENDING  Incomplete  MRSA PCR Screening     Status: None   Collection Time: 05/10/18 12:32 AM  Result Value Ref Range Status   MRSA by PCR NEGATIVE NEGATIVE Final    Comment:        The GeneXpert MRSA Assay (FDA approved for NASAL specimens only), is one component of a comprehensive MRSA colonization surveillance program. It is not intended to diagnose MRSA infection nor to guide or monitor treatment for MRSA infections. Performed at [facility], [address], [place], [place] [address]          Radiology Studies: Ct Pelvis W Contrast  Result Date: 05/09/2018 CLINICAL DATA:  Buttock pain and fever. Sepsis muscle likely from buttock/perineal and cellulitis. Concern for  possible necrotizing fasciitis. EXAM: CT PELVIS WITH CONTRAST TECHNIQUE: Multidetector CT imaging of the pelvis was performed using the standard protocol following the bolus administration of intravenous contrast. CONTRAST:  ISOVUE-300 IOPAMIDOL (ISOVUE-300) INJECTION 61% COMPARISON:  08/08/2017 CT FINDINGS: Urinary Tract: Physiologic distention of the urinary bladder is noted. The distal ureters are unremarkable. Bowel: Normal appearing included appendix. Scattered colonic diverticulosis along the distal descending and sigmoid colon without acute diverticulitis. Mechanical bowel obstruction or inflammation. 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 Atherosclerosis with ectasia of the left common iliac artery 1.5 cm. No inguinal, pelvic sidewall, retroperitoneal or iliac chain adenopathy. Reproductive:  Normal size prostate and seminal vesicles. Other:  No free air nor free fluid. Musculoskeletal: Cellulitis of the medial right buttock with mottled peripherally enhancing soft tissue abscess or stigmata necrotizing fasciitis just caudad to the right ischium in the expected location of the distal right semimembranosus, semitendinosus, rectus femoris and quadratus femoris muscles and tendons. This collection contains mottled foci of gas and is estimated at 5.9 x 4.8 cm on series 3/130. The craniocaudad extent is difficult to ascertain on the reformats as it is incompletely included. Cortical bone loss is noted of the right ischial tuberosity is concerning for changes of osteomyelitis. IMPRESSION: 1. Abnormal enhancing soft tissue collection centered just caudad to the right ischial tuberosity measuring at least  5.9 x 4.7 cm hand AP by transverse dimension and involving the distal right semimembranosus, semitendinosus, rectus femoris and quadratus femoris muscles and tendons. This raises concern for soft tissue abscess and/or stigmata of necrotizing fasciitis. 2. Cortical bone loss of the right ischial tuberosity also raises concern changes of osteomyelitis. 3. 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 4. Ectasia of the left common iliac artery to 1.5 cm. Electronically Signed   By: Tollie Eth M.D.   On: 05/09/2018 15:59        Scheduled Meds: . enoxaparin (LOVENOX) injection  40 mg Subcutaneous Q24H  . feeding supplement (PRO-STAT SUGAR FREE 64)  30 mL Oral BID  . gabapentin  300 mg Oral Q8H  . hydrocortisone sodium succinate  50 mg Intravenous Q6H  . ipratropium-albuterol  3 mL Nebulization Q6H  . multivitamin with minerals  1 tablet Oral Daily  . nicotine  7 mg Transdermal Daily  . nutrition supplement (JUVEN)  1 packet Oral BID BM  . saccharomyces boulardii  250 mg Oral BID   Continuous Infusions: . sodium chloride    . clindamycin (CLEOCIN) IV 600 mg (05/11/18 1351)  . piperacillin-tazobactam (ZOSYN)  IV 3.375 g (05/11/18 1350)  . vancomycin Stopped (05/11/18 5686)     LOS: 2 days    Time spent: 35 minutes    Ramiro Harvest, MD [facility] Pager [phone]  If 7PM-7AM, please contact night-coverage [email or web address] Password [email or web address] 05/11/2018, 3:30 PM

## 2018-05-12 ENCOUNTER — Ambulatory Visit: Payer: Medicaid Other | Admitting: Physician Assistant

## 2018-05-12 ENCOUNTER — Ambulatory Visit: Payer: Medicaid Other | Admitting: Cardiology

## 2018-05-12 DIAGNOSIS — M549 Dorsalgia, unspecified: Secondary | ICD-10-CM

## 2018-05-12 DIAGNOSIS — L0231 Cutaneous abscess of buttock: Secondary | ICD-10-CM

## 2018-05-12 DIAGNOSIS — L8990 Pressure ulcer of unspecified site, unspecified stage: Secondary | ICD-10-CM

## 2018-05-12 LAB — BASIC METABOLIC PANEL
Anion gap: 5 (ref 5–15)
BUN: 19 mg/dL (ref 8–23)
CO2: 26 mmol/L (ref 22–32)
Calcium: 8.2 mg/dL — ABNORMAL LOW (ref 8.9–10.3)
Chloride: 108 mmol/L (ref 98–111)
Creatinine, Ser: 0.65 mg/dL (ref 0.61–1.24)
GFR calc Af Amer: >60 mL/min (ref >60)
GFR calc non Af Amer: >60 mL/min (ref >60)
Glucose, Bld: 166 mg/dL — ABNORMAL HIGH (ref 70–99)
Potassium: 4.6 mmol/L (ref 3.5–5.1)
Sodium: 139 mmol/L (ref 135–145)

## 2018-05-12 LAB — GLUCOSE, CAPILLARY
GLUCOSE-CAPILLARY: 143 mg/dL — AB (ref 70–99)
GLUCOSE-CAPILLARY: 154 mg/dL — AB (ref 70–99)
Glucose-Capillary: 141 mg/dL — ABNORMAL HIGH (ref 70–99)

## 2018-05-12 LAB — CBC
HCT: 27.9 % — ABNORMAL LOW (ref 39.0–52.0)
Hemoglobin: 8.6 g/dL — ABNORMAL LOW (ref 13.0–17.0)
MCH: 27.6 pg (ref 26.0–34.0)
MCHC: 30.8 g/dL (ref 30.0–36.0)
MCV: 89.4 fL (ref 78.0–100.0)
Platelets: 252 10*3/uL (ref 150–400)
RBC: 3.12 MIL/uL — ABNORMAL LOW (ref 4.22–5.81)
RDW: 15.6 % — ABNORMAL HIGH (ref 11.5–15.5)
WBC: 6.2 10*3/uL (ref 4.0–10.5)

## 2018-05-12 MED ORDER — INSULIN ASPART 100 UNIT/ML ~~LOC~~ SOLN
0.0000 [IU] | Freq: Three times a day (TID) | SUBCUTANEOUS | Status: DC
  Administered 2018-05-12 – 2018-05-13 (×3): 1 [IU] via SUBCUTANEOUS
  Administered 2018-05-14: 2 [IU] via SUBCUTANEOUS
  Administered 2018-05-15: 1 [IU] via SUBCUTANEOUS
  Administered 2018-05-15: 2 [IU] via SUBCUTANEOUS
  Administered 2018-05-16 – 2018-05-17 (×3): 1 [IU] via SUBCUTANEOUS

## 2018-05-12 MED ORDER — IPRATROPIUM-ALBUTEROL 0.5-2.5 (3) MG/3ML IN SOLN
3.0000 mL | Freq: Two times a day (BID) | RESPIRATORY_TRACT | Status: DC
  Administered 2018-05-12 – 2018-05-18 (×11): 3 mL via RESPIRATORY_TRACT
  Filled 2018-05-12 (×10): qty 3

## 2018-05-12 MED ORDER — HYDROCORTISONE NA SUCCINATE PF 100 MG IJ SOLR
50.0000 mg | Freq: Two times a day (BID) | INTRAMUSCULAR | Status: DC
  Administered 2018-05-12 – 2018-05-13 (×2): 50 mg via INTRAVENOUS
  Filled 2018-05-12 (×2): qty 2

## 2018-05-12 MED ORDER — MORPHINE SULFATE (PF) 2 MG/ML IV SOLN
1.0000 mg | INTRAVENOUS | Status: DC | PRN
  Administered 2018-05-12 – 2018-05-18 (×23): 2 mg via INTRAVENOUS
  Filled 2018-05-12 (×23): qty 1

## 2018-05-12 MED ORDER — TRAMADOL HCL 50 MG PO TABS
50.0000 mg | ORAL_TABLET | Freq: Four times a day (QID) | ORAL | Status: DC | PRN
  Administered 2018-05-12 – 2018-05-16 (×2): 50 mg via ORAL
  Administered 2018-05-17: 100 mg via ORAL
  Filled 2018-05-12 (×2): qty 1
  Filled 2018-05-12: qty 2

## 2018-05-12 MED ORDER — HYDROCODONE-ACETAMINOPHEN 10-325 MG PO TABS
1.0000 | ORAL_TABLET | Freq: Three times a day (TID) | ORAL | Status: DC
  Administered 2018-05-12 – 2018-05-18 (×18): 1 via ORAL
  Filled 2018-05-12 (×19): qty 1

## 2018-05-12 NOTE — Progress Notes (Signed)
[facility]  [facility] Infusion Coordinator will follow with ID team to support IV ABX at DC if ordered and home is DC location.  If patient discharges after hours, please call [phone].   [name] 05/12/2018, 12:56 PM

## 2018-05-12 NOTE — Progress Notes (Signed)
PROGRESS NOTE    [name]  ZOX:[id number] DOB: [birth year] DOA: 05/09/2018 PCP: [name], PA-C    Brief Narrative:  62 year old male well-known to the [place] pulmonary critical care medicine service because of his known COPD interstitial lung disease and connective tissue disease with chronic immunosuppression who returns to our facility complaining of right sided buttock pain.  He has had a wound which is being received dressing at his nursing facility for the last 2 weeks.  He says it has been bleeding intermittently and they have been giving him wound dressing changes there.  Today it bled more so they sent him here for further evaluation.  He had a CT scan which showed evidence of necrotizing skin infection so pulmonary and critical care medicine was asked to admit and surgery was consulted.  Patient seen by general surgery and subsequently underwent I and D on 05/19/2018 with cultures pending.  Patient placed empirically on IV vancomycin, IV Zosyn, IV clindamycin.  Patient subsequently transferred from the ICU and placed on telemetry.      Assessment & Plan:   Principal Problem:   Abscess of buttock Active Problems:   CAD (coronary artery disease)   ILD (interstitial lung disease) (HCC)   Anemia   HTN (hypertension)   S/P CABG (coronary artery bypass graft)   Chronic combined systolic and diastolic CHF (congestive heart failure) (HCC)   Hx of rheumatoid arthritis   Rheumatoid arthritis (HCC)   Chronic obstructive pulmonary disease (HCC)   Supplemental oxygen dependent   AAA (abdominal aortic aneurysm) without rupture (HCC)   History of CVA with residual deficit   Adrenal insufficiency (HCC): Relative  #1 right buttocks abscess with possible osteomyelitis Status post I&D 05/09/2018 per Dr. Gerrit Friends.  Patient currently afebrile.  Normal white count.  Cultures were obtained which are currently pending.  Continue dressing changes with saline moist Kerlix twice daily  per general surgery recommendations.  IV vancomycin IV clindamycin have been discontinued.  Patient currently on IV Zosyn.  Cultures pending.  ID following and I appreciate the input and recommendations.   2.  Chronic respiratory failure on home O2/COPD/interstitial lung disease Currently stable.  Immunosuppression on hold.  Continue nicotine patch and scheduled duo nebs.  Outpatient follow-up with pulmonary.  3.  Hypertension Blood pressure stable.  On stress dose steroids.  Decrease IV Solu-Medrol to 50 mg every 12 hours and taper.  IV fluids have been saline locked.  Follow.    4.  Rheumatoid arthritis Currently on stress dose steroids.  Taper steroids back to home dose.    5.  Relative adrenal insufficiency Currently on stress dose steroids.  Decrease IV Solumedrol to 50 mg IV every 12 hours.  Follow blood pressure closely.    6.  History of recent left basal ganglia and posterior left centrum ovale CVA 04/10/2018. Plavix currently on hold.  Resume plavix. Monitor for bleeding.  7.  AAA Outpatient follow-up.  8.  Chronic systolic and diastolic CHF EF 30 to 35% Stable.  Saline locked IV fluids.  Monitor closely for volume overload.  Resumed Lipitor, Coreg.  Follow.  9.  Chronic anemia No bleeding.  Follow H&H.     DVT prophylaxis: Lovenox Code Status: Full Family Communication: Updated patient and wife at bedside. Disposition Plan: To be determined.   Consultants:   General surgery: Dr. Gerrit Friends 05/09/2018  PCCM admission  Procedures:   CT pelvis 05/19/2018  Chest x-ray 05/09/2018  I&D 05/09/2018 per Dr. Gerrit Friends  Antimicrobials:  IV clindamycin 05/09/2018>>>>> 05/11/2018  IV Zosyn 05/09/2018  IV Vanc 05/09/2018>>>>> 05/11/2018   Subjective: Patient laying in bed.  Denies any chest pain no shortness of breath.  Feels right hip pain is better controlled.  Tolerating oral intake.  No complaints.    Objective: Vitals:   05/11/18 2057 05/12/18 0505 05/12/18 0507 05/12/18  0845  BP: 112/71 106/72    Pulse: 83 (!) 59  65  Resp: 18 16  16   Temp: 97.8 F (36.6 C) (!) 97.4 F (36.3 C)    TempSrc: Oral Oral    SpO2: 97% 98%  99%  Weight:   90.2 kg (198 lb 13.7 oz)   Height:        Intake/Output Summary (Last 24 hours) at 05/12/2018 0933 Last data filed at 05/12/2018 0129 Gross per 24 hour  Intake 360 ml  Output 2650 ml  Net -2290 ml   Filed Weights   05/10/18 0500 05/11/18 0554 05/12/18 0507  Weight: 87.2 kg (192 lb 3.9 oz) 90.2 kg (198 lb 13.7 oz) 90.2 kg (198 lb 13.7 oz)    Examination:  General exam: Appears calm and comfortable  Respiratory system: Bibasilar crackles.  No wheezing.  No rhonchi.  Respiratory effort normal. Cardiovascular system: RRR no m/r/g. No LE edema. Gastrointestinal system: Abdomen is soft/NT/ND/+BS. No organomegaly.  Central nervous system: Alert and oriented. No focal neurological deficits. Extremities: Symmetric 5 x 5 power. Skin: R gluteal wound with packing. Psychiatry: Judgement and insight appear normal. Mood & affect appropriate.     Data Reviewed: I have personally reviewed following labs and imaging studies  CBC: Recent Labs  Lab 05/09/18 1332 05/10/18 0358 05/11/18 0412 05/12/18 0421  WBC 9.4 9.8 7.0 6.2  NEUTROABS 7.8*  --   --   --   HGB 11.0* 9.4* 8.5* 8.6*  HCT 34.7* 30.1* 27.1* 27.9*  MCV 86.1 87.5 88.6 89.4  PLT 167 161 200 252   Basic Metabolic Panel: Recent Labs  Lab 05/09/18 1332 05/10/18 0358 05/11/18 0412 05/12/18 0421  NA 133* 137 136 139  K 4.0 4.4 4.8 4.6  CL 99 107 106 108  CO2 24 23 24 26   GLUCOSE 178* 192* 209* 166*  BUN 19 16 18 19   CREATININE 0.77 0.75 0.66 0.65  CALCIUM 8.0* 7.8* 7.9* 8.2*   GFR: Estimated Creatinine Clearance: 108.2 mL/min (by C-G formula based on SCr of 0.65 mg/dL). Liver Function Tests: Recent Labs  Lab 05/09/18 1332  AST 30  ALT 29  ALKPHOS 127*  BILITOT 0.8  PROT 6.5  ALBUMIN 2.2*   No results for input(s): LIPASE, AMYLASE in the  last 168 hours. No results for input(s): AMMONIA in the last 168 hours. Coagulation Profile: Recent Labs  Lab 05/09/18 1332  INR 1.08   Cardiac Enzymes: No results for input(s): CKTOTAL, CKMB, CKMBINDEX, TROPONINI in the last 168 hours. BNP (last 3 results) No results for input(s): PROBNP in the last 8760 hours. HbA1C: No results for input(s): HGBA1C in the last 72 hours. CBG: No results for input(s): GLUCAP in the last 168 hours. Lipid Profile: No results for input(s): CHOL, HDL, LDLCALC, TRIG, CHOLHDL, LDLDIRECT in the last 72 hours. Thyroid Function Tests: No results for input(s): TSH, T4TOTAL, FREET4, T3FREE, THYROIDAB in the last 72 hours. Anemia Panel: No results for input(s): VITAMINB12, FOLATE, FERRITIN, TIBC, IRON, RETICCTPCT in the last 72 hours. Sepsis Labs: Recent Labs  Lab 05/09/18 1329 05/09/18 1457 05/11/18 0822  LATICACIDVEN 2.54* 2.28* 1.7    Recent  Results (from the past 240 hour(s))  Culture, blood (Routine x 2)     Status: None (Preliminary result)   Collection Time: 05/09/18  1:32 PM  Result Value Ref Range Status   Specimen Description   Final    BLOOD RIGHT HAND Performed at [facility], [address], [place], [place] [address]    Special Requests   Final    BOTTLES DRAWN AEROBIC AND ANAEROBIC Blood Culture adequate volume Performed at [facility], [address] [place], [place] [address]    Culture   Final    NO GROWTH 2 DAYS Performed at [facility], [address], [place], [place] [address]    Report Status PENDING  Incomplete  Culture, blood (Routine x 2)     Status: None (Preliminary result)   Collection Time: 05/09/18  1:35 PM  Result Value Ref Range Status   Specimen Description   Final    BLOOD RIGHT FOREARM Performed at [facility], [address], [place], [place] [address]    Special Requests   Final    BOTTLES DRAWN AEROBIC AND ANAEROBIC Blood Culture  adequate volume Performed at [facility], [address] [place], [place] [address]    Culture   Final    NO GROWTH 2 DAYS Performed at [facility], [address] [place], [place], [place] [address]    Report Status PENDING  Incomplete  Urine culture     Status: Abnormal   Collection Time: 05/09/18  3:46 PM  Result Value Ref Range Status   Specimen Description   Final    URINE, RANDOM Performed at [facility], [address], [place], [place] [address]    Special Requests   Final    NONE Performed at [facility], [address], [place], [place] [address]    Culture (A)  Final    <10,000 COLONIES/mL INSIGNIFICANT GROWTH Performed at [facility], [address], [place], [place] [address]    Report Status 05/10/2018 FINAL  Final  Aerobic/Anaerobic Culture (surgical/deep wound)     Status: None (Preliminary result)   Collection Time: 05/09/18  8:34 PM  Result Value Ref Range Status   Specimen Description   Final    ABSCESS Performed at [facility], [address] [place], [place] [address]    Special Requests   Final    PERIRECTAL Performed at [facility], [address] [place], [place] [address]    Gram Stain   Final    ABUNDANT WBC PRESENT, PREDOMINANTLY PMN MODERATE GRAM POSITIVE COCCI FEW GRAM NEGATIVE RODS    Culture   Final    CULTURE REINCUBATED FOR BETTER GROWTH Performed at [facility], [address] [place] [place], [place] [address]    Report Status PENDING  Incomplete  MRSA PCR Screening     Status: None   Collection Time: 05/10/18 12:32 AM  Result Value Ref Range Status   MRSA by PCR NEGATIVE NEGATIVE Final    Comment:        The GeneXpert MRSA Assay (FDA approved for NASAL specimens only), is one component of a comprehensive MRSA colonization surveillance program. It is not intended to diagnose MRSA infection nor to guide or monitor  treatment for MRSA infections. Performed at [facility], [address], [place], [place] [address]          Radiology Studies:  No results found.      Scheduled Meds: . atorvastatin  40 mg Oral q1800  . carvedilol  3.125 mg Oral BID WC  . enoxaparin (LOVENOX) injection  40 mg Subcutaneous Q24H  . feeding supplement (PRO-STAT SUGAR FREE 64)  30 mL Oral BID  . gabapentin  300 mg Oral Q8H  . HYDROcodone-acetaminophen  1 tablet Oral TID  . hydrocortisone sodium succinate  50 mg Intravenous Q12H  . insulin aspart  0-9 Units Subcutaneous TID WC  . ipratropium-albuterol  3 mL Nebulization TID  . multivitamin with minerals  1 tablet Oral Daily  . nicotine  7 mg Transdermal Daily  . nutrition supplement (JUVEN)  1 packet Oral BID BM  . saccharomyces boulardii  250 mg Oral BID   Continuous Infusions: . sodium chloride    . piperacillin-tazobactam (ZOSYN)  IV 3.375 g (05/12/18 0507)     LOS: 3 days    Time spent: 35 minutes    Ramiro Harvest, MD [facility] Pager [phone]  If 7PM-7AM, please contact night-coverage [email or web address] Password [facility] 05/12/2018, 9:33 AM

## 2018-05-12 NOTE — Care Management Note (Signed)
Case Management Note  Patient Details  Name: [name] MRN: [id number] Date of Birth: 09/24/1956  Subjective/Objective:                    Action/Plan:Pt is from [facility]. When discharged pt may need IV ABX. CSW following.   Expected Discharge Date:  (unknown)               Expected Discharge Plan:  Skilled Nursing Facility(From [facility])  In-House Referral:  Clinical Social Work  Discharge planning Services  CM Consult  Post Acute Care Choice:    Choice offered to:     DME Arranged:    DME Agency:     HH Arranged:    HH Agency:     Status of Service:  Completed, signed off  If discussed at [employer], dates discussed:    Additional CommentsGeni Bers, RN 05/12/2018, 1:15 PM

## 2018-05-12 NOTE — Progress Notes (Signed)
OT Cancellation Note  Patient Details Name: [name] MRN: [id number] DOB: 06/02/1956   Cancelled Treatment:    Reason Eval/Treat Not Completed: Other (comment). Pt just finished working with PT and is back in bed. Will check tomorrow  Sam Wunschel 05/12/2018, 3:15 PM  Marica Otter, OTR/L [phone] 05/12/2018

## 2018-05-12 NOTE — Clinical Social Work Note (Signed)
Clinical Social Work Assessment  Patient Details  Name: [name] MRN: [id number] Date of Birth: 02-02-1956  Date of referral:  05/12/18               Reason for consult:  Facility Placement                Permission sought to share information with:  Facility Medical sales representative, Family Supports Permission granted to share information::  Yes, Verbal Permission Granted  Name::     [employer]   Agency::     Relationship::  sister   Contact Information:  [phone]  Housing/Transportation Living arrangements for the past 2 months:  Mobile Home, Skilled Nursing Facility(Patient admitted from [facility] SNF originally from home with sister) Source of Information:  Patient, Siblings Patient Interpreter Needed:  None Criminal Activity/Legal Involvement Pertinent to Current Situation/Hospitalization:  No - Comment as needed Significant Relationships:  Siblings Lives with:  Siblings, Facility Resident Do you feel safe going back to the place where you live?    Need for family participation in patient care:  Yes (Comment)  Care giving concerns: Patient's sister reported that at baseline patient was independent with ambulating and ADLs. Patient admitted from [facility]. Patient's sister reported that patient was admitted into SNF after recent hospitalization for ST rehab to work with physical therapy. Patient admitted to [facility] SNF under Medicaid benefit. Patient not happy with care received at [facility] and wants a new SNF placement. PT recommending SNF.     Social Worker assessment / plan:  CSW spoke with patient/patient's sister at bedside regarding discharge planning and request for new SNF at discharge. Patient's sister reported that they are not happy with care patient received at [facility]. Patient's sister informed CSW about care patient received on his wound at [facility] and it is why patient's wound has worsened and caused current  hospitalization. CSW acknowledged and validated patient's concerns. CSW and patient/patient's sister discussed Medicaid coverage for SNF and possibility of finding a new SNF placement. CSW explained that patient's Medicaid may be a barrier to finding a new SNF for patient, patient verbalized understanding. CSW agreed to start the process of searching for a new SNF for patient.   CSW will complete patient's FL2 and follow up with bed offers.  CSW will continue to follow and assist with discharge planning.  Employment status:  Disabled (Comment on whether or not currently receiving Disability) Insurance information:  Medicaid In [place] PT Recommendations:  SNF Information / Referral to community resources:  Skilled Nursing Facility  Patient/Family's Response to care:  Patient/patient's sister appreciative of CSW assistance with discharge planning.   Patient/Family's Understanding of and Emotional Response to Diagnosis, Current Treatment, and Prognosis:  Patient presented calm and verbalized understanding of current treatment plan. Patient hopeful to find a new SNF. Patient's sister hopeful that patient can work with physical therapy and return to baseline.   Emotional Assessment Appearance:  Appears stated age Attitude/Demeanor/Rapport:  Engaged Affect (typically observed):  Appropriate, Calm Orientation:  Oriented to Self, Oriented to Situation, Oriented to Place, Oriented to  Time Alcohol / Substance use:  Not Applicable Psych involvement (Current and /or in the community):  No (Comment)  Discharge Needs  Concerns to be addressed:  Care Coordination Readmission within the last 30 days:  No Current discharge risk:  Physical Impairment Barriers to Discharge:  Continued Medical Work up   [employer], LCSW 05/12/2018, 2:51 PM

## 2018-05-12 NOTE — [place] FL2 (Signed)
[place] MEDICAID FL2 LEVEL OF CARE SCREENING TOOL     IDENTIFICATION  Patient Name: [name] Birthdate: July 01, 1956 Sex: male Admission Date (Current Location): 05/09/2018  [facility] and [place] Number:  Producer, television/film/video and Address:  [facility],  [address] [place], [place] [address]      Provider Number: [id number]  Attending Physician Name and Address:  Rodolph Bong, MD  Relative Name and Phone Number:  Maretta Los [phone]    Current Level of Care: Hospital Recommended Level of Care: Skilled Nursing Facility Prior Approval Number:    Date Approved/Denied:   PASRR Number: [id number]  Discharge Plan: SNF    Current Diagnoses: Patient Active Problem List   Diagnosis Date Noted  . Adrenal insufficiency (HCC): Relative 05/11/2018  . Abscess of buttock 05/11/2018  . AKI (acute kidney injury) (HCC)   . Urinary retention   . Rheumatoid arthritis (HCC)   . Chronic obstructive pulmonary disease (HCC)   . Supplemental oxygen dependent   . Tobacco abuse   . AAA (abdominal aortic aneurysm) without rupture (HCC)   . History of CVA with residual deficit   . Acute blood loss anemia   . Hyperkalemia   . Cerebral thrombosis with cerebral infarction 04/11/2018  . Sacral ulcer, with unspecified severity (HCC) 04/05/2018  . Pressure injury of skin of sacral region   . Severe comorbid illness   . Acute renal failure (HCC)   . Shock (HCC) 04/04/2018  . High anion gap metabolic acidosis 04/04/2018  . Acute kidney injury (HCC) 04/04/2018  . Cervicalgia 01/18/2018  . Chronic pain of both shoulders 01/18/2018  . Hx of rheumatoid arthritis 01/18/2018  . Chronic pain syndrome 01/18/2018  . Leukocytosis 12/15/2017  . Cough   . Pneumonia 11/01/2017  . Elevated lipase   . Elevated liver enzymes   . Acute pancreatitis 08/08/2017  . Carotid artery disease (HCC) 05/12/2017  . 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 Chronic  combined systolic and diastolic CHF (congestive heart failure) (HCC) 03/07/2017  . S/P CABG (coronary artery bypass graft)   . Pure hypercholesterolemia   . Acute combined systolic and diastolic heart failure (HCC)   . Exertional shortness of breath   . Solitary pulmonary nodule   . CAD (coronary artery disease) 02/24/2017  . ILD (interstitial lung disease) (HCC) 02/24/2017  . Anemia 02/24/2017  . AAA (abdominal aortic aneurysm) (HCC) 02/24/2017  . Abnormal EKG 02/24/2017  . Acute respiratory failure with hypoxia (HCC) 02/24/2017  . History of stroke 02/24/2017  . HTN (hypertension) 02/24/2017  . Current smoker 02/24/2017    Orientation RESPIRATION BLADDER Height & Weight     Self, Time, Situation, Place  Normal Incontinent Weight: 198 lb 13.7 oz (90.2 kg) Height:  6\' 1"  (185.4 cm)  BEHAVIORAL SYMPTOMS/MOOD NEUROLOGICAL BOWEL NUTRITION STATUS        Diet(see dc summary)  AMBULATORY STATUS COMMUNICATION OF NEEDS Skin   Extensive Assist Verbally Other (Comment)(Incision(Closed) ButtocksRight Abdominal Pads;Gauze changed twice a day)                       Personal Care Assistance Level of Assistance  Bathing, Feeding, Dressing Bathing Assistance: Maximum assistance Feeding assistance: Independent Dressing Assistance: Maximum assistance     Functional Limitations Info  Sight, Hearing, Speech Sight Info: Adequate Hearing Info: Adequate Speech Info: Adequate    SPECIAL CARE FACTORS FREQUENCY  PT (By licensed PT), OT (By licensed OT)  PT Frequency: 5x/week OT Frequency: 5x/week            Contractures      Additional Factors Info  Code Status, Allergies Code Status Info: Full Code Allergies Info: NKA           Current Medications (05/12/2018):  This is the current hospital active medication list Current Facility-Administered Medications  Medication Dose Route Frequency Provider Last Rate Last Dose  . 0.9 %  sodium chloride infusion  250 mL  Intravenous PRN Max Fickle B, MD      . acetaminophen (TYLENOL) tablet 650 mg  650 mg Oral Q6H PRN Darnell Level, MD       Or  . acetaminophen (TYLENOL) suppository 650 mg  650 mg Rectal Q6H PRN Darnell Level, MD      . albuterol (PROVENTIL) (2.5 MG/3ML) 0.083% nebulizer solution 2.5 mg  2.5 mg Nebulization Q6H PRN Rodolph Bong, MD      . atorvastatin (LIPITOR) tablet 40 mg  40 mg Oral q1800 Rodolph Bong, MD   40 mg at 05/11/18 1808  . carvedilol (COREG) tablet 3.125 mg  3.125 mg Oral BID WC Rodolph Bong, MD   3.125 mg at 05/12/18 0914  . enoxaparin (LOVENOX) injection 40 mg  40 mg Subcutaneous Q24H Ollis, Brandi L, NP   40 mg at 05/12/18 0911  . feeding supplement (PRO-STAT SUGAR FREE 64) liquid 30 mL  30 mL Oral BID Alyson Reedy, MD   30 mL at 05/12/18 0912  . gabapentin (NEURONTIN) capsule 300 mg  300 mg Oral Q8H McQuaid, Douglas B, MD   300 mg at 05/12/18 1310  . HYDROcodone-acetaminophen (NORCO) 10-325 MG per tablet 1 tablet  1 tablet Oral TID Rodolph Bong, MD      . HYDROcodone-acetaminophen (NORCO/VICODIN) 5-325 MG per tablet 1-2 tablet  1-2 tablet Oral Q4H PRN Darnell Level, MD   2 tablet at 05/12/18 1317  . hydrocortisone sodium succinate (SOLU-CORTEF) 100 MG injection 50 mg  50 mg Intravenous Q12H Rodolph Bong, MD      . insulin aspart (novoLOG) injection 0-9 Units  0-9 Units Subcutaneous TID WC Rodolph Bong, MD   1 Units at 05/12/18 1310  . ipratropium-albuterol (DUONEB) 0.5-2.5 (3) MG/3ML nebulizer solution 3 mL  3 mL Nebulization BID Rodolph Bong, MD      . morphine 2 MG/ML injection 1-2 mg  1-2 mg Intravenous Q4H PRN Rodolph Bong, MD   2 mg at 05/12/18 1033  . multivitamin with minerals tablet 1 tablet  1 tablet Oral Daily Alyson Reedy, MD   1 tablet at 05/12/18 0914  . nicotine (NICODERM CQ - dosed in mg/24 hr) patch 7 mg  7 mg Transdermal Daily Max Fickle B, MD   7 mg at 05/12/18 0910  . nutrition supplement (JUVEN)  (JUVEN) powder packet 1 packet  1 packet Oral BID BM Alyson Reedy, MD   1 packet at 05/12/18 1310  . ondansetron (ZOFRAN-ODT) disintegrating tablet 4 mg  4 mg Oral Q6H PRN Darnell Level, MD       Or  . ondansetron (ZOFRAN) injection 4 mg  4 mg Intravenous Q6H PRN Darnell Level, MD      . piperacillin-tazobactam (ZOSYN) IVPB 3.375 g  3.375 g Intravenous Neldon Labella, MD 12.5 mL/hr at 05/12/18 1311 3.375 g at 05/12/18 1311  . saccharomyces boulardii (FLORASTOR) capsule 250 mg  250 mg Oral BID Canary Brim L, NP   250  mg at 05/12/18 0914  . traMADol (ULTRAM) tablet 50-100 mg  50-100 mg Oral Q6H PRN Rodolph Bong, MD   50 mg at 05/12/18 1033     Discharge Medications: Please see discharge summary for a list of discharge medications.  Relevant Imaging Results:  Relevant Lab Results:   Additional Information SS#: [id number]  Antionette Poles, LCSW

## 2018-05-12 NOTE — Progress Notes (Signed)
INFECTIOUS DISEASE PROGRESS NOTE  ID: [name] is a 62 y.o. male with  Principal Problem:   Abscess of buttock Active Problems:   CAD (coronary artery disease)   ILD (interstitial lung disease) (HCC)   Anemia   HTN (hypertension)   S/P CABG (coronary artery bypass graft)   Chronic combined systolic and diastolic CHF (congestive heart failure) (HCC)   Hx of rheumatoid arthritis   Rheumatoid arthritis (HCC)   Chronic obstructive pulmonary disease (HCC)   Supplemental oxygen dependent   AAA (abdominal aortic aneurysm) without rupture (HCC)   History of CVA with residual deficit   Adrenal insufficiency (HCC): Relative  Subjective: C/o back/wound pain.   Abtx:  Anti-infectives (From admission, onward)   Start     Dose/Rate Route Frequency Ordered Stop   05/10/18 0600  vancomycin (VANCOCIN) 1,250 mg in sodium chloride 0.9 % 250 mL IVPB  Status:  Discontinued     1,250 mg 166.7 mL/hr over 90 Minutes Intravenous Every 12 hours 05/09/18 1724 05/11/18 1552   05/09/18 2200  piperacillin-tazobactam (ZOSYN) IVPB 3.375 g  Status:  Discontinued     3.375 g 100 mL/hr over 30 Minutes Intravenous  Once 05/09/18 2156 05/09/18 2202   05/09/18 2200  clindamycin (CLEOCIN) IVPB 600 mg  Status:  Discontinued     600 mg 100 mL/hr over 30 Minutes Intravenous Every 8 hours 05/09/18 2156 05/11/18 1552   05/09/18 2200  piperacillin-tazobactam (ZOSYN) IVPB 3.375 g     3.375 g 12.5 mL/hr over 240 Minutes Intravenous Every 8 hours 05/09/18 1717     05/09/18 1600  clindamycin (CLEOCIN) IVPB 600 mg     600 mg 100 mL/hr over 30 Minutes Intravenous  Once 05/09/18 1551 05/09/18 1657   05/09/18 1430  piperacillin-tazobactam (ZOSYN) IVPB 3.375 g     3.375 g 100 mL/hr over 30 Minutes Intravenous  Once 05/09/18 1420 05/09/18 1542   05/09/18 1430  vancomycin (VANCOCIN) IVPB 1000 mg/200 mL premix  Status:  Discontinued     1,000 mg 200 mL/hr over 60 Minutes Intravenous  Once 05/09/18 1420 05/09/18 1426    05/09/18 1430  vancomycin (VANCOCIN) 2,000 mg in sodium chloride 0.9 % 500 mL IVPB     2,000 mg 250 mL/hr over 120 Minutes Intravenous STAT 05/09/18 1426 05/09/18 1750      Medications:  Scheduled: . atorvastatin  40 mg Oral q1800  . carvedilol  3.125 mg Oral BID WC  . enoxaparin (LOVENOX) injection  40 mg Subcutaneous Q24H  . feeding supplement (PRO-STAT SUGAR FREE 64)  30 mL Oral BID  . gabapentin  300 mg Oral Q8H  . HYDROcodone-acetaminophen  1 tablet Oral TID  . hydrocortisone sodium succinate  50 mg Intravenous Q12H  . insulin aspart  0-9 Units Subcutaneous TID WC  . ipratropium-albuterol  3 mL Nebulization TID  . multivitamin with minerals  1 tablet Oral Daily  . nicotine  7 mg Transdermal Daily  . nutrition supplement (JUVEN)  1 packet Oral BID BM  . saccharomyces boulardii  250 mg Oral BID    Objective: Vital signs in last 24 hours: Temp:  [97.4 F (36.3 C)-97.8 F (36.6 C)] 97.4 F (36.3 C) (07/11 0505) Pulse Rate:  [59-83] 65 (07/11 0845) Resp:  [16-18] 16 (07/11 0845) BP: (106-115)/(71-75) 106/72 (07/11 0505) SpO2:  [97 %-99 %] 99 % (07/11 0845) Weight:  [90.2 kg (198 lb 13.7 oz)] 90.2 kg (198 lb 13.7 oz) (07/11 0507)   General appearance: alert, cooperative and  no distress Resp: clear to auscultation bilaterally Cardio: regular rate and rhythm GI: normal findings: bowel sounds normal and soft, non-tender  Lab Results Recent Labs    05/11/18 0412 05/12/18 0421  WBC 7.0 6.2  HGB 8.5* 8.6*  HCT 27.1* 27.9*  NA 136 139  K 4.8 4.6  CL 106 108  CO2 24 26  BUN 18 19  CREATININE 0.66 0.65   Liver Panel Recent Labs    05/09/18 1332  PROT 6.5  ALBUMIN 2.2*  AST 30  ALT 29  ALKPHOS 127*  BILITOT 0.8   Sedimentation Rate No results for input(s): ESRSEDRATE in the last 72 hours. C-Reactive Protein No results for input(s): CRP in the last 72 hours.  Microbiology: Recent Results (from the past 240 hour(s))  Culture, blood (Routine x 2)      Status: None (Preliminary result)   Collection Time: 05/09/18  1:32 PM  Result Value Ref Range Status   Specimen Description   Final    BLOOD RIGHT HAND Performed at [facility], [address] [place], [place] [address]    Special Requests   Final    BOTTLES DRAWN AEROBIC AND ANAEROBIC Blood Culture adequate volume Performed at [facility], [address] [place], [place] [address]    Culture   Final    NO GROWTH 3 DAYS Performed at [facility], [address] [place], [place] [address]    Report Status PENDING  Incomplete  Culture, blood (Routine x 2)     Status: None (Preliminary result)   Collection Time: 05/09/18  1:35 PM  Result Value Ref Range Status   Specimen Description   Final    BLOOD RIGHT FOREARM Performed at [facility], [address], [place], [place] [address]    Special Requests   Final    BOTTLES DRAWN AEROBIC AND ANAEROBIC Blood Culture adequate volume Performed at [facility], [address] [place], [place] [address]    Culture   Final    NO GROWTH 3 DAYS Performed at [facility], [address] [place], [place] [address]    Report Status PENDING  Incomplete  Urine culture     Status: Abnormal   Collection Time: 05/09/18  3:46 PM  Result Value Ref Range Status   Specimen Description   Final    URINE, RANDOM Performed at [facility], [address] [place], [place] [address]    Special Requests   Final    NONE Performed at [facility], [address], [place], [place] [address]    Culture (A)  Final    <10,000 COLONIES/mL INSIGNIFICANT GROWTH Performed at [facility], [address] [place], [place] [address]    Report Status 05/10/2018 FINAL  Final  Aerobic/Anaerobic Culture (surgical/deep wound)     Status: None (Preliminary result)   Collection Time: 05/09/18  8:34 PM  Result Value Ref Range  Status   Specimen Description   Final    ABSCESS Performed at [facility], [address] [place], [place] [address]    Special Requests   Final    PERIRECTAL Performed at [facility], [address], [place], [place] [address]    Gram Stain   Final    ABUNDANT WBC PRESENT, PREDOMINANTLY PMN MODERATE GRAM POSITIVE COCCI FEW GRAM NEGATIVE RODS    Culture   Final    CULTURE REINCUBATED  FOR BETTER GROWTH Performed at [facility], [address], [place], [place] [address]    Report Status PENDING  Incomplete  MRSA PCR Screening     Status: None   Collection Time: 05/10/18 12:32 AM  Result Value Ref Range Status   MRSA by PCR NEGATIVE NEGATIVE Final    Comment:        The GeneXpert MRSA Assay (FDA approved for NASAL specimens only), is one component of a comprehensive MRSA colonization surveillance program. It is not intended to diagnose MRSA infection nor to guide or monitor treatment for MRSA infections. Performed at [facility], [address] [place], [place] [address]     Studies/Results: No results found.   Assessment/Plan: Necrotizing fascitis Decubitus ulcer  Abscess COPD ILD on plaquinil, steroids chronically  Total days of antibiotics: 3 zosyn  Abscess Cx still incubating BCx are ngtd No change in anbx for now Appreciate pharm help         Johny Sax MD, FACP Infectious Diseases (pager) [phone] [email or web address] 05/12/2018, 1:02 PM  LOS: 3 days

## 2018-05-12 NOTE — Plan of Care (Signed)
  Problem: Pain Managment: Goal: General experience of comfort will improve Outcome: Progressing   

## 2018-05-12 NOTE — Progress Notes (Signed)
Went by and saw patient, RN had already changed dressing. She reports wound is largely the same, maybe one small area with some darkened tissue at the inferior portion of the wound. Patient still having a lot of pain with dressing changes. Will re-examine wound tomorrow.  Wells Guiles , [facility] Surgery 05/12/2018, 2:48 PM Pager: [phone] Mon-Fri 7:00 am-4:30 pm Sat-Sun 7:00 am-11:30 am

## 2018-05-12 NOTE — Progress Notes (Signed)
Pharmacy Antibiotic Note  [name] is a 62 y.o. male admitted on 05/09/2018 with infected right buttock wound concerning for necrotizing infection.  Pharmacy has been consulted for zosyn dosing. 05/12/2018 D3 full abx WBC WNL, AF. Awaiting culture data. Per ID will need long term abx with osteo on his CT.   Plan: Zosyn 3.375g IV q8h (4 hour infusion).  F/u renal fxn, WBC, temp, culture data F/u ID recs  Height: 6\' 1"  (185.4 cm) Weight: 198 lb 13.7 oz (90.2 kg) IBW/kg (Calculated) : 79.9  Temp (24hrs), Avg:97.6 F (36.4 C), Min:97.4 F (36.3 C), Max:97.8 F (36.6 C)  Recent Labs  Lab 05/09/18 1329 05/09/18 1332 05/09/18 1457 05/10/18 0358 05/11/18 0412 05/11/18 0822 05/12/18 0421  WBC  --  9.4  --  9.8 7.0  --  6.2  CREATININE  --  0.77  --  0.75 0.66  --  0.65  LATICACIDVEN 2.54*  --  2.28*  --   --  1.7  --     Estimated Creatinine Clearance: 108.2 mL/min (by C-G formula based on SCr of 0.65 mg/dL).    No Known Allergies Antimicrobials this admission: 7/8 vanc >>7/10 7/8 zosyn >> 7/8 clinda >>7/10 Dose adjustments this admission: Microbiology results: 7/8 Ucx: insig growth 7/8 BCx: ngtd 7/8 MRSA PCR: negative 7/8 Abscess/Wound: mod GPC, few GNR >> reincubated  Thank you for allowing pharmacy to be a part of this patient's care.  9/8, Pharm.D. Herby Abraham 05/12/2018 8:31 AM

## 2018-05-12 NOTE — Evaluation (Signed)
Physical Therapy Evaluation Patient Details Name: [name] MRN: [id number] DOB: Oct 26, 1956 Today's Date: 05/12/2018   History of Present Illness  62 y.o.male with PMHx CVA, AAA ,CAD, Chronic combined systolic and diastolic CHF(April 2018 EF 35 to 40%), MI,  ILD, Lung nodule, and on chronic prednisone, Tobacco abuse, MI, NSVT, Normocytic anemia,Pancreatitis, RA (rheumatoid arthritis).  Pt was admitted from SNF complaining of right sided buttock pain.  He has had a wound which was receiving dressing changes at his nursing facility.  CT scan which showed evidence of necrotizing skin infection and general surgery performed I&D of right buttock wound on 05/09/18  Clinical Impression  Pt admitted with above diagnosis. Pt currently with functional limitations due to the deficits listed below (see PT Problem List).  Pt will benefit from skilled PT to increase their independence and safety with mobility to allow discharge to the venue listed below.  Pt presents with significant deconditioning, balance impairments, and decreased strength.  Pt reports PT stopped working with him at [facility] and he has been mostly in bed.  Pt with significant lower body weakness with testing so did not attempt to stand today.  Pt was assisted to sitting EOB and tolerated approximately 4 minutes of sitting with UE support.  Pt would benefit from pressure redistribution wheel chair cushion.  Recommend post acute rehab placement prior to d/c home.     Follow Up Recommendations SNF    Equipment Recommendations  Wheelchair cushion (measurements PT)    Recommendations for Other Services       Precautions / Restrictions Precautions Precautions: Fall      Mobility  Bed Mobility Overal bed mobility: Needs Assistance Bed Mobility: Supine to Sit;Sit to Supine     Supine to sit: Max assist Sit to supine: Mod assist   General bed mobility comments: assist for lower body to sit upright, pt self assisted using upper body  and rails, assist for LEs onto bed  Transfers                 General transfer comment: NT for safety - pt has not performed standing in approx one month  Ambulation/Gait                Stairs            Wheelchair Mobility    Modified Rankin (Stroke Patients Only)       Balance Overall balance assessment: Needs assistance Sitting-balance support: Bilateral upper extremity supported;Feet supported Sitting balance-Leahy Scale: Poor Sitting balance - Comments: requires UE support, pt can briefly remain upright without UE support however quickly returns to using UEs for steadying                                     Pertinent Vitals/Pain Pain Assessment: Faces Faces Pain Scale: Hurts even more Pain Location: R buttock Pain Descriptors / Indicators: Aching Pain Intervention(s): Limited activity within patient's tolerance;Repositioned    Home Living Family/patient expects to be discharged to:: Skilled nursing facility     Type of Home: Mobile home       Home Layout: One level Home Equipment: Walker - 2 wheels;Wheelchair - manual;Cane - single point;Walker - 4 wheels;Shower seat      Prior Function           Comments: pt was independent prior to admission to [facility] one month ago, discharged to SNF at that time and  now readmitted     Hand Dominance        Extremity/Trunk Assessment   Upper Extremity Assessment Upper Extremity Assessment: Defer to OT evaluation(L UE weaker then R UE, sister reports CVA last admission, limited L shoulder AROM)    Lower Extremity Assessment Lower Extremity Assessment: LLE deficits/detail;RLE deficits/detail RLE Deficits / Details: grossly 2+/5 throughout, pt unable to perform full AROM except for ankle pumps LLE Deficits / Details: grossly 2+/5 throughout, pt unable to perform full AROM except for ankle pumps       Communication   Communication: No difficulties  Cognition Arousal/Alertness:  Awake/alert Behavior During Therapy: WFL for tasks assessed/performed Overall Cognitive Status: Within Functional Limits for tasks assessed                                        General Comments General comments (skin integrity, edema, etc.): Pt with R buttock wound which was surgically debrided, discussed repositioning and floating heels    Exercises     Assessment/Plan    PT Assessment Patient needs continued PT services  PT Problem List Decreased strength;Decreased mobility;Decreased range of motion;Decreased balance;Decreased knowledge of use of DME;Decreased skin integrity;Decreased activity tolerance       PT Treatment Interventions DME instruction;Therapeutic activities;Therapeutic exercise;Patient/family education;Balance training;Functional mobility training;Wheelchair mobility training    PT Goals (Current goals can be found in the Care Plan section)  Acute Rehab PT Goals PT Goal Formulation: With patient/family Time For Goal Achievement: 05/26/18 Potential to Achieve Goals: Good    Frequency Min 2X/week   Barriers to discharge        Co-evaluation               AM-PAC PT "6 Clicks" Daily Activity  Outcome Measure Difficulty turning over in bed (including adjusting bedclothes, sheets and blankets)?: A Lot Difficulty moving from lying on back to sitting on the side of the bed? : Unable Difficulty sitting down on and standing up from a chair with arms (e.g., wheelchair, bedside commode, etc,.)?: Unable Help needed moving to and from a bed to chair (including a wheelchair)?: Total Help needed walking in hospital room?: Total Help needed climbing 3-5 steps with a railing? : Total 6 Click Score: 7    End of Session   Activity Tolerance: Patient limited by fatigue Patient left: in bed;with call bell/phone within reach;with bed alarm set;with family/visitor present   PT Visit Diagnosis: Muscle weakness (generalized) (M62.81);Other  abnormalities of gait and mobility (R26.89)    Time: 1430-1453 PT Time Calculation (min) (ACUTE ONLY): 23 min   Charges:   PT Evaluation $PT Eval Moderate Complexity: 1 Mod     PT G Codes:       Zenovia Jarred, PT, DPT 05/12/2018 Pager: [phone]  Maida Sale E 05/12/2018, 4:16 PM

## 2018-05-13 LAB — BASIC METABOLIC PANEL
ANION GAP: 4 — AB (ref 5–15)
BUN: 21 mg/dL (ref 8–23)
CALCIUM: 8.2 mg/dL — AB (ref 8.9–10.3)
CO2: 28 mmol/L (ref 22–32)
CREATININE: 0.69 mg/dL (ref 0.61–1.24)
Chloride: 108 mmol/L (ref 98–111)
GFR calc Af Amer: >60 mL/min (ref >60)
GLUCOSE: 134 mg/dL — AB (ref 70–99)
Potassium: 4.2 mmol/L (ref 3.5–5.1)
Sodium: 140 mmol/L (ref 135–145)

## 2018-05-13 LAB — CBC WITH DIFFERENTIAL/PLATELET
Basophils Absolute: 0 10*3/uL (ref 0.0–0.1)
Basophils Relative: 0 %
EOS ABS: 0 10*3/uL (ref 0.0–0.7)
Eosinophils Relative: 0 %
HCT: 28.6 % — ABNORMAL LOW (ref 39.0–52.0)
Hemoglobin: 8.9 g/dL — ABNORMAL LOW (ref 13.0–17.0)
LYMPHS ABS: 1.8 10*3/uL (ref 0.7–4.0)
Lymphocytes Relative: 23 %
MCH: 28.2 pg (ref 26.0–34.0)
MCHC: 31.1 g/dL (ref 30.0–36.0)
MCV: 90.5 fL (ref 78.0–100.0)
MONO ABS: 0.5 10*3/uL (ref 0.1–1.0)
Monocytes Relative: 6 %
Neutro Abs: 5.6 10*3/uL (ref 1.7–7.7)
Neutrophils Relative %: 71 %
PLATELETS: 278 10*3/uL (ref 150–400)
RBC: 3.16 MIL/uL — AB (ref 4.22–5.81)
RDW: 15.8 % — ABNORMAL HIGH (ref 11.5–15.5)
WBC: 8 10*3/uL (ref 4.0–10.5)

## 2018-05-13 LAB — MAGNESIUM: MAGNESIUM: 2.2 mg/dL (ref 1.7–2.4)

## 2018-05-13 LAB — GLUCOSE, CAPILLARY
Glucose-Capillary: 86 mg/dL (ref 70–99)
Glucose-Capillary: 109 mg/dL — ABNORMAL HIGH (ref 70–99)
Glucose-Capillary: 132 mg/dL — ABNORMAL HIGH (ref 70–99)
Glucose-Capillary: 108 mg/dL — ABNORMAL HIGH (ref 70–99)

## 2018-05-13 MED ORDER — CARVEDILOL 6.25 MG PO TABS
6.2500 mg | ORAL_TABLET | Freq: Two times a day (BID) | ORAL | Status: DC
  Administered 2018-05-14 – 2018-05-18 (×9): 6.25 mg via ORAL
  Filled 2018-05-13 (×9): qty 1

## 2018-05-13 MED ORDER — CLOPIDOGREL BISULFATE 75 MG PO TABS
75.0000 mg | ORAL_TABLET | Freq: Every day | ORAL | Status: DC
  Administered 2018-05-13 – 2018-05-18 (×6): 75 mg via ORAL
  Filled 2018-05-13 (×6): qty 1

## 2018-05-13 MED ORDER — CARVEDILOL 3.125 MG PO TABS
3.1250 mg | ORAL_TABLET | Freq: Once | ORAL | Status: AC
  Administered 2018-05-13: 3.125 mg via ORAL
  Filled 2018-05-13: qty 1

## 2018-05-13 MED ORDER — CARVEDILOL 6.25 MG PO TABS: 6.2500 mg | ORAL_TABLET | Freq: Two times a day (BID) | ORAL | Status: DC

## 2018-05-13 MED ORDER — HYDROCORTISONE NA SUCCINATE PF 100 MG IJ SOLR: 50.0000 mg | Freq: Every day | INTRAMUSCULAR | Status: DC

## 2018-05-13 MED ORDER — PREDNISONE 10 MG PO TABS
10.0000 mg | ORAL_TABLET | Freq: Every day | ORAL | Status: DC
  Administered 2018-05-14 – 2018-05-18 (×5): 10 mg via ORAL
  Filled 2018-05-13 (×5): qty 1

## 2018-05-13 NOTE — Clinical Social Work Placement (Addendum)
CSW provided patient/patient's sister with bed offer to [facility]. Patient's sister reported that she was going to visit [facility] before making decision. Patient's sister reported that if patient has to return to [facility] SNF patient will go back. [facility] SNF reported that patient is able to return if patient wants to return. CSW will continue to follow and assist with discharge planning.  CLINICAL SOCIAL WORK PLACEMENT  NOTE  Date:  05/13/2018  Patient Details  Name: [name] MRN: [id number] Date of Birth: Jan 28, 1956  Clinical Social Work is seeking post-discharge placement for this patient at the Skilled  Nursing Facility level of care (*CSW will initial, date and re-position this form in  chart as items are completed):  Yes   Patient/family provided with [facility] Clinical Social Work Department's list of facilities offering this level of care within the geographic area requested by the patient (or if unable, by the patient's family).  Yes   Patient/family informed of their freedom to choose among providers that offer the needed level of care, that participate in Medicare, Medicaid or managed care program needed by the patient, have an available bed and are willing to accept the patient.  Yes   Patient/family informed of [facility]'s ownership interest in [facility] and [facility], as well as of the fact that they are under no obligation to receive care at these facilities.  PASRR submitted to EDS on       PASRR number received on       Existing PASRR number confirmed on 05/12/18     FL2 transmitted to all facilities in geographic area requested by pt/family on 05/12/18     FL2 transmitted to all facilities within larger geographic area on       Patient informed that his/her managed care company has contracts with or will negotiate with certain facilities, including the following:        Yes   Patient/family informed of bed offers  received.  Patient chooses bed at       Physician recommends and patient chooses bed at      Patient to be transferred to   on  .  Patient to be transferred to facility by       Patient family notified on   of transfer.  Name of family member notified:        PHYSICIAN       Additional Comment:    _______________________________________________ Antionette Poles, LCSW 05/13/2018, 3:38 PM

## 2018-05-13 NOTE — Evaluation (Signed)
Occupational Therapy Evaluation Patient Details Name: [name] MRN: [id number] DOB: 1956-07-07 Today's Date: 05/13/2018    History of Present Illness 62 y.o.male with PMHx CVA, AAA ,CAD, Chronic combined systolic and diastolic CHF(April 2018 EF 35 to 40%), MI,  ILD, Lung nodule, and on chronic prednisone, Tobacco abuse, MI, NSVT, Normocytic anemia,Pancreatitis, RA (rheumatoid arthritis).  Pt was admitted from SNF complaining of right sided buttock pain.  He has had a wound which was receiving dressing changes at his nursing facility.  CT scan which showed evidence of necrotizing skin infection and general surgery performed I&D of right buttock wound on 05/09/18   Clinical Impression   Since admission to SNF; pt has required assist with all ADL at bed level. Currently pt requires mod-max assist overall for ADL and tolerated sitting EOB x5 minutes with min guard assist. Attempted sit to stand from EOB x3 with max assist but pt unable to clear bottom off EOB. Recommend return to SNF for continued rehab upon d/c. Pt would benefit from continued skilled OT to address established goals.    Follow Up Recommendations  SNF;Supervision/Assistance - 24 hour    Equipment Recommendations  Other (comment)(TBD at next venue)    Recommendations for Other Services       Precautions / Restrictions Precautions Precautions: Fall Precaution Comments: R buttocks wound Restrictions Weight Bearing Restrictions: No      Mobility Bed Mobility Overal bed mobility: Needs Assistance Bed Mobility: Rolling;Sidelying to Sit;Sit to Supine Rolling: Supervision Sidelying to sit: Mod assist   Sit to supine: Mod assist   General bed mobility comments: Assist for LEs and trunk. Pt rolling toward L side to offload R buttock.  Transfers Overall transfer level: Needs assistance Equipment used: Rolling walker (2 wheeled)             General transfer comment: Attempted sit to stand from EOB x3 with max  assist; pt with significant bil LE weakness and unable to clear bottom off EOB    Balance Overall balance assessment: Needs assistance Sitting-balance support: Feet supported;Bilateral upper extremity supported Sitting balance-Leahy Scale: Fair Sitting balance - Comments: bil UE support throghout, min guard assist   Standing balance support: Bilateral upper extremity supported Standing balance-Leahy Scale: Zero                             ADL either performed or assessed with clinical judgement   ADL Overall ADL's : Needs assistance/impaired Eating/Feeding: Set up;Bed level   Grooming: Min guard;Sitting   Upper Body Bathing: Moderate assistance;Sitting   Lower Body Bathing: Maximal assistance;Bed level   Upper Body Dressing : Moderate assistance;Sitting   Lower Body Dressing: Maximal assistance;Bed level                 General ADL Comments: Pt tolerated sitting EOB x5 minutes today with min guard assist throughout. Attempted sit to stand from EOB with max assist +1; unsuccessful at all attempts     Vision         Perception     Praxis      Pertinent Vitals/Pain Pain Assessment: Faces Faces Pain Scale: Hurts even more Pain Location: R buttock Pain Descriptors / Indicators: Aching Pain Intervention(s): Monitored during session;Limited activity within patient's tolerance     Hand Dominance     Extremity/Trunk Assessment Upper Extremity Assessment Upper Extremity Assessment: Generalized weakness   Lower Extremity Assessment Lower Extremity Assessment: Defer to PT evaluation  Communication Communication Communication: No difficulties   Cognition Arousal/Alertness: Awake/alert Behavior During Therapy: WFL for tasks assessed/performed Overall Cognitive Status: Within Functional Limits for tasks assessed                                     General Comments       Exercises     Shoulder Instructions      Home  Living Family/patient expects to be discharged to:: Skilled nursing facility                                        Prior Functioning/Environment Level of Independence: Needs assistance  Gait / Transfers Assistance Needed: Since at SNF pt has been working on standing but no ambulating with PT, hoyer lift transfers to w/c occasionally but basically bed bound ADL's / Homemaking Assistance Needed: Staff at SNF assisting with all ADL at bed level            OT Problem List: Decreased strength;Decreased range of motion;Decreased activity tolerance;Impaired balance (sitting and/or standing);Decreased knowledge of use of DME or AE;Impaired UE functional use;Pain;Increased edema      OT Treatment/Interventions: Self-care/ADL training;Therapeutic exercise;Energy conservation;DME and/or AE instruction;Therapeutic activities;Patient/family education;Balance training    OT Goals(Current goals can be found in the care plan section) Acute Rehab OT Goals Patient Stated Goal: walk again OT Goal Formulation: With patient/family Time For Goal Achievement: 05/27/18 Potential to Achieve Goals: Good ADL Goals Pt/caregiver will Perform Home Exercise Program: Increased strength;Both right and left upper extremity;With theraband;Independently;With written HEP provided Additional ADL Goal #1: Pt will perform bed mobility with min assist as precursor to ADL. Additional ADL Goal #2: Pt will tolerate sitting EOB x10 minutes with min guard assist during ADL. Additional ADL Goal #3: Pt will perform sit to stand with max assist as precursor to functional mobility.  OT Frequency: Min 2X/week   Barriers to D/C:            Co-evaluation              AM-PAC PT "6 Clicks" Daily Activity     Outcome Measure Help from another person eating meals?: A Little Help from another person taking care of personal grooming?: A Little Help from another person toileting, which includes using toliet,  bedpan, or urinal?: Total Help from another person bathing (including washing, rinsing, drying)?: A Lot Help from another person to put on and taking off regular upper body clothing?: A Lot Help from another person to put on and taking off regular lower body clothing?: Total 6 Click Score: 12   End of Session Equipment Utilized During Treatment: Gait belt;Rolling walker  Activity Tolerance: Patient tolerated treatment well Patient left: in bed;with call bell/phone within reach;with bed alarm set;with family/visitor present  OT Visit Diagnosis: Other abnormalities of gait and mobility (R26.89);Muscle weakness (generalized) (M62.81);Pain Pain - Right/Left: Right Pain - part of body: (buttock)                Time: 1962-2297 OT Time Calculation (min): 19 min Charges:  OT General Charges $OT Visit: 1 Visit OT Evaluation $OT Eval Moderate Complexity: 1 Mod G-Codes:     Niala Stcharles A. Brett Albino, M.S., OTR/L Acute Rehab Department: [phone]  Gaye Alken 05/13/2018, 2:01 PM

## 2018-05-13 NOTE — Progress Notes (Signed)
Patient had 8 runs of V-tach, denies any chest pain/distress. Vitals WNL- 130/81, 74, 98%-RA, Dr. Janee Morn notified, lab for mg ordered and will continue to monitor patient

## 2018-05-13 NOTE — Progress Notes (Signed)
PROGRESS NOTE    [name]  XQJ:[id number] DOB: Mar 20, 1956 DOA: 05/09/2018 PCP: [name], PA-C    Brief Narrative:  62 year old male well-known to the [place] pulmonary critical care medicine service because of his known COPD interstitial lung disease and connective tissue disease with chronic immunosuppression who returns to our facility complaining of right sided buttock pain.  He has had a wound which is being received dressing at his nursing facility for the last 2 weeks.  He says it has been bleeding intermittently and they have been giving him wound dressing changes there.  Today it bled more so they sent him here for further evaluation.  He had a CT scan which showed evidence of necrotizing skin infection so pulmonary and critical care medicine was asked to admit and surgery was consulted.  Patient seen by general surgery and subsequently underwent I and D on 05/19/2018 with cultures pending.  Patient placed empirically on IV vancomycin, IV Zosyn, IV clindamycin.  Patient subsequently transferred from the ICU and placed on telemetry.      Assessment & Plan:   Principal Problem:   Abscess of buttock Active Problems:   CAD (coronary artery disease)   ILD (interstitial lung disease) (HCC)   Anemia   HTN (hypertension)   S/P CABG (coronary artery bypass graft)   Chronic combined systolic and diastolic CHF (congestive heart failure) (HCC)   Hx of rheumatoid arthritis   Rheumatoid arthritis (HCC)   Chronic obstructive pulmonary disease (HCC)   Supplemental oxygen dependent   AAA (abdominal aortic aneurysm) without rupture (HCC)   History of CVA with residual deficit   Adrenal insufficiency (HCC): Relative  #1 right buttocks abscess with possible osteomyelitis Status post I&D 05/09/2018 per Dr. Gerrit Friends.  Patient currently afebrile.  Normal white count.  Cultures were obtained which are currently pending.  Continue dressing changes with saline moist Kerlix twice daily  per general surgery recommendations.  IV vancomycin IV clindamycin have been discontinued.  Patient currently on IV Zosyn.  Cultures pending.  ID following and I appreciate the input and recommendations.   2.  Chronic respiratory failure on home O2/COPD/interstitial lung disease Currently stable.  Immunosuppression on hold.  Continue nicotine patch and scheduled duo nebs.  Outpatient follow-up with pulmonary.  3.  Hypertension Blood pressure stable.  On stress dose steroids.  Decrease IV Solu-Medrol to 50 mg daily and transition to home dose oral prednisone tomorrow.   4.  Rheumatoid arthritis Currently on stress dose steroids.  Taper steroids back to home dose.    5.  Relative adrenal insufficiency Currently on stress dose steroids.  Decrease IV Solumedrol to 50 mg IV daily.  Likely transition back to home dose oral prednisone tomorrow 05/14/2018.    6.  History of recent left basal ganglia and posterior left centrum ovale CVA 04/10/2018. Resume Plavix.  Monitor for bleeding.    7.  AAA Outpatient follow-up.  8.  Chronic systolic and diastolic CHF EF 30 to 35% Stable.  Continue current regimen of Lipitor, Coreg.  IV fluids have been saline locked.   9.  Chronic anemia H&H stable.  No bleeding.      DVT prophylaxis: Lovenox Code Status: Full Family Communication: Updated patient.  No family at bedside.   Disposition Plan: Skilled nursing facility when medically stable.     Consultants:   General surgery: Dr. Gerrit Friends 05/09/2018  PCCM admission  Procedures:   CT pelvis 05/19/2018  Chest x-ray 05/09/2018  I&D 05/09/2018 per Dr. Gerrit Friends  Antimicrobials:   IV clindamycin 05/09/2018>>>>> 05/11/2018  IV Zosyn 05/09/2018  IV Vanc 05/09/2018>>>>> 05/11/2018   Subjective: Patient in bed.  No chest pain or shortness of breath.  Complains of right hip pain.  States pain medication helps with hip pain.    Objective: Vitals:   05/12/18 2101 05/13/18 0500 05/13/18 0821 05/13/18 0859  BP:  114/71 124/80  (!) 123/99  Pulse: 69 60  77  Resp: 18 18    Temp: 97.7 F (36.5 C) 97.7 F (36.5 C)    TempSrc: Oral Oral    SpO2: 99% 96% 98%   Weight:  90 kg (198 lb 6.6 oz)    Height:        Intake/Output Summary (Last 24 hours) at 05/13/2018 0944 Last data filed at 05/13/2018 0600 Gross per 24 hour  Intake 720 ml  Output 2300 ml  Net -1580 ml   Filed Weights   05/11/18 0554 05/12/18 0507 05/13/18 0500  Weight: 90.2 kg (198 lb 13.7 oz) 90.2 kg (198 lb 13.7 oz) 90 kg (198 lb 6.6 oz)    Examination:  General exam: NAD. Respiratory system: Bibasilar crackles.  No wheezing.  No rhonchi.  Respiratory effort normal. Cardiovascular system: Regular rate and rhythm no murmurs rubs or gallops.  No lower extremity edema.   Gastrointestinal system: Abdomen is soft/NT/ND/+BS. No organomegaly.  Central nervous system: Alert and oriented. No focal neurological deficits. Extremities: Symmetric 5 x 5 power. Skin: R gluteal wound with packing. Psychiatry: Judgement and insight appear normal. Mood & affect appropriate.     Data Reviewed: I have personally reviewed following labs and imaging studies  CBC: Recent Labs  Lab 05/09/18 1332 05/10/18 0358 05/11/18 0412 05/12/18 0421 05/13/18 0433  WBC 9.4 9.8 7.0 6.2 8.0  NEUTROABS 7.8*  --   --   --  5.6  HGB 11.0* 9.4* 8.5* 8.6* 8.9*  HCT 34.7* 30.1* 27.1* 27.9* 28.6*  MCV 86.1 87.5 88.6 89.4 90.5  PLT 167 161 200 252 278   Basic Metabolic Panel: Recent Labs  Lab 05/09/18 1332 05/10/18 0358 05/11/18 0412 05/12/18 0421 05/13/18 0433  NA 133* 137 136 139 140  K 4.0 4.4 4.8 4.6 4.2  CL 99 107 106 108 108  CO2 24 23 24 26 28   GLUCOSE 178* 192* 209* 166* 134*  BUN 19 16 18 19 21   CREATININE 0.77 0.75 0.66 0.65 0.69  CALCIUM 8.0* 7.8* 7.9* 8.2* 8.2*   GFR: Estimated Creatinine Clearance: 108.2 mL/min (by C-G formula based on SCr of 0.69 mg/dL). Liver Function Tests: Recent Labs  Lab 05/09/18 1332  AST 30  ALT 29    ALKPHOS 127*  BILITOT 0.8  PROT 6.5  ALBUMIN 2.2*   No results for input(s): LIPASE, AMYLASE in the last 168 hours. No results for input(s): AMMONIA in the last 168 hours. Coagulation Profile: Recent Labs  Lab 05/09/18 1332  INR 1.08   Cardiac Enzymes: No results for input(s): CKTOTAL, CKMB, CKMBINDEX, TROPONINI in the last 168 hours. BNP (last 3 results) No results for input(s): PROBNP in the last 8760 hours. HbA1C: No results for input(s): HGBA1C in the last 72 hours. CBG: Recent Labs  Lab 05/12/18 1218 05/12/18 1638 05/12/18 2104 05/13/18 0808  GLUCAP 141* 143* 154* 86   Lipid Profile: No results for input(s): CHOL, HDL, LDLCALC, TRIG, CHOLHDL, LDLDIRECT in the last 72 hours. Thyroid Function Tests: No results for input(s): TSH, T4TOTAL, FREET4, T3FREE, THYROIDAB in the last 72 hours. Anemia Panel: No results  for input(s): VITAMINB12, FOLATE, FERRITIN, TIBC, IRON, RETICCTPCT in the last 72 hours. Sepsis Labs: Recent Labs  Lab 05/09/18 1329 05/09/18 1457 05/11/18 0822  LATICACIDVEN 2.54* 2.28* 1.7    Recent Results (from the past 240 hour(s))  Culture, blood (Routine x 2)     Status: None (Preliminary result)   Collection Time: 05/09/18  1:32 PM  Result Value Ref Range Status   Specimen Description   Final    BLOOD RIGHT HAND Performed at [facility], [address] [place], [place] [address]    Special Requests   Final    BOTTLES DRAWN AEROBIC AND ANAEROBIC Blood Culture adequate volume Performed at [facility], [address] [place], [place] [address]    Culture   Final    NO GROWTH 3 DAYS Performed at [facility], [address], [place], [place] [address]    Report Status PENDING  Incomplete  Culture, blood (Routine x 2)     Status: None (Preliminary result)   Collection Time: 05/09/18  1:35 PM  Result Value Ref Range Status   Specimen Description   Final    BLOOD RIGHT FOREARM Performed at  [facility], [address] [place], [place] [address]    Special Requests   Final    BOTTLES DRAWN AEROBIC AND ANAEROBIC Blood Culture adequate volume Performed at [facility], [address] [place], [place] [address]    Culture   Final    NO GROWTH 3 DAYS Performed at [facility], [address], [place], [place] [address]    Report Status PENDING  Incomplete  Urine culture     Status: Abnormal   Collection Time: 05/09/18  3:46 PM  Result Value Ref Range Status   Specimen Description   Final    URINE, RANDOM Performed at [facility], [address], [place], [place] [address]    Special Requests   Final    NONE Performed at [facility], [address], [place], [place] [address]    Culture (A)  Final    <10,000 COLONIES/mL INSIGNIFICANT GROWTH Performed at [facility], [address], [place], [place] [address]    Report Status 05/10/2018 FINAL  Final  Aerobic/Anaerobic Culture (surgical/deep wound)     Status: None (Preliminary result)   Collection Time: 05/09/18  8:34 PM  Result Value Ref Range Status   Specimen Description   Final    ABSCESS Performed at [facility], [address] [place], [place] [address]    Special Requests   Final    PERIRECTAL Performed at [facility], [address] [place], [place] [address]    Gram Stain   Final    ABUNDANT WBC PRESENT, PREDOMINANTLY PMN MODERATE GRAM POSITIVE COCCI FEW GRAM NEGATIVE RODS    Culture   Final    CULTURE REINCUBATED FOR BETTER GROWTH Performed at [facility], [address], [place], [place] [address]    Report Status PENDING  Incomplete  MRSA PCR Screening     Status: None   Collection Time: 05/10/18 12:32 AM  Result Value Ref Range Status   MRSA by PCR NEGATIVE NEGATIVE Final    Comment:        The GeneXpert MRSA Assay (FDA approved for NASAL  specimens only), is one component of a comprehensive MRSA colonization surveillance program. It is not intended to diagnose MRSA  infection nor to guide or monitor treatment for MRSA infections. Performed at [facility], [address] [place], [place] [address]          Radiology Studies: No results found.      Scheduled Meds: . atorvastatin  40 mg Oral q1800  . carvedilol  3.125 mg Oral BID WC  . clopidogrel  75 mg Oral Daily  . enoxaparin (LOVENOX) injection  40 mg Subcutaneous Q24H  . feeding supplement (PRO-STAT SUGAR FREE 64)  30 mL Oral BID  . gabapentin  300 mg Oral Q8H  . HYDROcodone-acetaminophen  1 tablet Oral TID  . hydrocortisone sodium succinate  50 mg Intravenous Q12H  . insulin aspart  0-9 Units Subcutaneous TID WC  . ipratropium-albuterol  3 mL Nebulization BID  . multivitamin with minerals  1 tablet Oral Daily  . nicotine  7 mg Transdermal Daily  . nutrition supplement (JUVEN)  1 packet Oral BID BM  . saccharomyces boulardii  250 mg Oral BID   Continuous Infusions: . piperacillin-tazobactam (ZOSYN)  IV 3.375 g (05/13/18 0649)     LOS: 4 days    Time spent: 35 minutes    Ramiro Harvest, MD [facility] Pager [phone]  If 7PM-7AM, please contact night-coverage [email or web address] Password [facility] 05/13/2018, 9:44 AM

## 2018-05-13 NOTE — Progress Notes (Signed)
INFECTIOUS DISEASE PROGRESS NOTE  ID: [name] is a 62 y.o. male with  Principal Problem:   Abscess of buttock Active Problems:   CAD (coronary artery disease)   ILD (interstitial lung disease) (HCC)   Anemia   HTN (hypertension)   S/P CABG (coronary artery bypass graft)   Chronic combined systolic and diastolic CHF (congestive heart failure) (HCC)   Hx of rheumatoid arthritis   Rheumatoid arthritis (HCC)   Chronic obstructive pulmonary disease (HCC)   Supplemental oxygen dependent   AAA (abdominal aortic aneurysm) without rupture (HCC)   History of CVA with residual deficit   Adrenal insufficiency (HCC): Relative  Subjective: C/o wound pain after dressing change.   Abtx:  Anti-infectives (From admission, onward)   Start     Dose/Rate Route Frequency Ordered Stop   05/10/18 0600  vancomycin (VANCOCIN) 1,250 mg in sodium chloride 0.9 % 250 mL IVPB  Status:  Discontinued     1,250 mg 166.7 mL/hr over 90 Minutes Intravenous Every 12 hours 05/09/18 1724 05/11/18 1552   05/09/18 2200  piperacillin-tazobactam (ZOSYN) IVPB 3.375 g  Status:  Discontinued     3.375 g 100 mL/hr over 30 Minutes Intravenous  Once 05/09/18 2156 05/09/18 2202   05/09/18 2200  clindamycin (CLEOCIN) IVPB 600 mg  Status:  Discontinued     600 mg 100 mL/hr over 30 Minutes Intravenous Every 8 hours 05/09/18 2156 05/11/18 1552   05/09/18 2200  piperacillin-tazobactam (ZOSYN) IVPB 3.375 g     3.375 g 12.5 mL/hr over 240 Minutes Intravenous Every 8 hours 05/09/18 1717     05/09/18 1600  clindamycin (CLEOCIN) IVPB 600 mg     600 mg 100 mL/hr over 30 Minutes Intravenous  Once 05/09/18 1551 05/09/18 1657   05/09/18 1430  piperacillin-tazobactam (ZOSYN) IVPB 3.375 g     3.375 g 100 mL/hr over 30 Minutes Intravenous  Once 05/09/18 1420 05/09/18 1542   05/09/18 1430  vancomycin (VANCOCIN) IVPB 1000 mg/200 mL premix  Status:  Discontinued     1,000 mg 200 mL/hr over 60 Minutes Intravenous  Once 05/09/18  1420 05/09/18 1426   05/09/18 1430  vancomycin (VANCOCIN) 2,000 mg in sodium chloride 0.9 % 500 mL IVPB     2,000 mg 250 mL/hr over 120 Minutes Intravenous STAT 05/09/18 1426 05/09/18 1750      Medications:  Scheduled: . atorvastatin  40 mg Oral q1800  . carvedilol  3.125 mg Oral BID WC  . clopidogrel  75 mg Oral Daily  . enoxaparin (LOVENOX) injection  40 mg Subcutaneous Q24H  . feeding supplement (PRO-STAT SUGAR FREE 64)  30 mL Oral BID  . gabapentin  300 mg Oral Q8H  . HYDROcodone-acetaminophen  1 tablet Oral TID  . insulin aspart  0-9 Units Subcutaneous TID WC  . ipratropium-albuterol  3 mL Nebulization BID  . multivitamin with minerals  1 tablet Oral Daily  . nicotine  7 mg Transdermal Daily  . nutrition supplement (JUVEN)  1 packet Oral BID BM  . [START ON 05/14/2018] predniSONE  10 mg Oral Q breakfast  . saccharomyces boulardii  250 mg Oral BID    Objective: Vital signs in last 24 hours: Temp:  [97.7 F (36.5 C)-97.9 F (36.6 C)] 97.7 F (36.5 C) (07/12 0500) Pulse Rate:  [60-80] 77 (07/12 0859) Resp:  [18-19] 18 (07/12 0500) BP: (114-137)/(71-99) 123/99 (07/12 0859) SpO2:  [96 %-100 %] 98 % (07/12 0821) Weight:  [90 kg (198 lb 6.6 oz)] 90 kg (  198 lb 6.6 oz) (07/12 0500)   General appearance: alert, cooperative, no distress and pale Resp: clear to auscultation bilaterally Cardio: regular rate and rhythm GI: normal findings: bowel sounds normal and soft, non-tender  Lab Results Recent Labs    05/12/18 0421 05/13/18 0433  WBC 6.2 8.0  HGB 8.6* 8.9*  HCT 27.9* 28.6*  NA 139 140  K 4.6 4.2  CL 108 108  CO2 26 28  BUN 19 21  CREATININE 0.65 0.69   Liver Panel No results for input(s): PROT, ALBUMIN, AST, ALT, ALKPHOS, BILITOT, BILIDIR, IBILI in the last 72 hours. Sedimentation Rate No results for input(s): ESRSEDRATE in the last 72 hours. C-Reactive Protein No results for input(s): CRP in the last 72 hours.  Microbiology: Recent Results (from the past  240 hour(s))  Culture, blood (Routine x 2)     Status: None (Preliminary result)   Collection Time: 05/09/18  1:32 PM  Result Value Ref Range Status   Specimen Description   Final    BLOOD RIGHT HAND Performed at [facility], [address], [place], [place] [address]    Special Requests   Final    BOTTLES DRAWN AEROBIC AND ANAEROBIC Blood Culture adequate volume Performed at [facility], [address] [place], [place] [address]    Culture   Final    NO GROWTH 3 DAYS Performed at [facility], [address], [place], [place] [address]    Report Status PENDING  Incomplete  Culture, blood (Routine x 2)     Status: None (Preliminary result)   Collection Time: 05/09/18  1:35 PM  Result Value Ref Range Status   Specimen Description   Final    BLOOD RIGHT FOREARM Performed at [facility], [address] [place], [place] [address]    Special Requests   Final    BOTTLES DRAWN AEROBIC AND ANAEROBIC Blood Culture adequate volume Performed at [facility], [address] [place] [address] [place], [place] [address]    Culture   Final    NO GROWTH 3 DAYS Performed at [facility], [address], [place], [place] [address]    Report Status PENDING  Incomplete  Urine culture     Status: Abnormal   Collection Time: 05/09/18  3:46 PM  Result Value Ref Range Status   Specimen Description   Final    URINE, RANDOM Performed at [facility], [address], [place], [place] [address]    Special Requests   Final    NONE Performed at [facility], [address], [place], [place] [address]    Culture (A)  Final    <10,000 COLONIES/mL INSIGNIFICANT GROWTH Performed at [facility], [address], [place], [place] [address]    Report Status 05/10/2018 FINAL  Final  Aerobic/Anaerobic Culture (surgical/deep wound)     Status: None (Preliminary result)   Collection Time:  05/09/18  8:34 PM  Result Value Ref Range Status   Specimen Description   Final    ABSCESS Performed at [facility], [address], [place], [place] [address]    Special Requests   Final    PERIRECTAL Performed at [facility], [address] [place], [place] [address]    Gram Stain   Final    ABUNDANT WBC PRESENT, PREDOMINANTLY PMN MODERATE GRAM POSITIVE COCCI FEW GRAM NEGATIVE RODS    Culture   Final  CULTURE REINCUBATED FOR BETTER GROWTH Performed at [facility], [address] [place], [place] [address]    Report Status PENDING  Incomplete  MRSA PCR Screening     Status: None   Collection Time: 05/10/18 12:32 AM  Result Value Ref Range Status   MRSA by PCR NEGATIVE NEGATIVE Final    Comment:        The GeneXpert MRSA Assay (FDA approved for NASAL specimens only), is one component of a comprehensive MRSA colonization surveillance program. It is not intended to diagnose MRSA infection nor to guide or monitor treatment for MRSA infections. Performed at [facility], [address] [place], [place] [address]     Studies/Results: No results found.   Assessment/Plan: Necrotizing fascitis Decubitus ulcer  Abscess COPD ILD on plaquinil, steroids chronically  Total days of antibiotics: 4 zosyn  Abscess Cx still incubating, poly-microbial BCx are ngtd No change in anbx for now. zosyn ID available as needed over w/e.          Johny Sax MD, FACP Infectious Diseases (pager) [phone] [email or web address] 05/13/2018, 11:07 AM  LOS: 4 days

## 2018-05-13 NOTE — Progress Notes (Signed)
[facility] Progress Note  4 Days Post-Op  Subjective: CC: R gluteal wound Patient reports pain with dressing changes. Denies fever/chills, nausea. Tolerating diet and having bowel function  Objective: Vital signs in last 24 hours: Temp:  [97.7 F (36.5 C)-97.9 F (36.6 C)] 97.7 F (36.5 C) (07/12 0500) Pulse Rate:  [60-80] 77 (07/12 0859) Resp:  [18-19] 18 (07/12 0500) BP: (114-137)/(71-99) 123/99 (07/12 0859) SpO2:  [96 %-100 %] 98 % (07/12 0821) Weight:  [90 kg (198 lb 6.6 oz)] 90 kg (198 lb 6.6 oz) (07/12 0500) Last BM Date: 05/12/18  Intake/Output from previous day: 07/11 0701 - 07/12 0700 In: 960 [P.O.:960] Out: 2300 [Urine:2300] Intake/Output this shift: No intake/output data recorded.  PE: Gen:  Alert, NAD, pleasant Card:  Regular rate and rhythm Pulm:  Normal effort, clear to auscultation bilaterally Abd: Soft, non-tender, non-distended, bowel sounds present Skin: R gluteal wound as below     Psych: A&Ox3   Lab Results:  Recent Labs    05/12/18 0421 05/13/18 0433  WBC 6.2 8.0  HGB 8.6* 8.9*  HCT 27.9* 28.6*  PLT 252 278   BMET Recent Labs    05/12/18 0421 05/13/18 0433  NA 139 140  K 4.6 4.2  CL 108 108  CO2 26 28  GLUCOSE 166* 134*  BUN 19 21  CREATININE 0.65 0.69  CALCIUM 8.2* 8.2*   PT/INR No results for input(s): LABPROT, INR in the last 72 hours. CMP     Component Value Date/Time   NA 140 05/13/2018 0433   NA 142 02/08/2018 1422   K 4.2 05/13/2018 0433   CL 108 05/13/2018 0433   CO2 28 05/13/2018 0433   GLUCOSE 134 (H) 05/13/2018 0433   BUN 21 05/13/2018 0433   BUN 12 02/08/2018 1422   CREATININE 0.69 05/13/2018 0433   CALCIUM 8.2 (L) 05/13/2018 0433   PROT 6.5 05/09/2018 1332   PROT 7.5 08/26/2017 1200   ALBUMIN 2.2 (L) 05/09/2018 1332   ALBUMIN 4.1 08/26/2017 1200   AST 30 05/09/2018 1332   ALT 29 05/09/2018 1332   ALKPHOS 127 (H) 05/09/2018 1332   BILITOT 0.8 05/09/2018 1332   BILITOT 0.9 08/26/2017  1200   GFRNONAA >60 05/13/2018 0433   GFRAA >60 05/13/2018 0433   Lipase     Component Value Date/Time   LIPASE 26 11/01/2017 1410       Studies/Results: No results found.  Anti-infectives: Anti-infectives (From admission, onward)   Start     Dose/Rate Route Frequency Ordered Stop   05/10/18 0600  vancomycin (VANCOCIN) 1,250 mg in sodium chloride 0.9 % 250 mL IVPB  Status:  Discontinued     1,250 mg 166.7 mL/hr over 90 Minutes Intravenous Every 12 hours 05/09/18 1724 05/11/18 1552   05/09/18 2200  piperacillin-tazobactam (ZOSYN) IVPB 3.375 g  Status:  Discontinued     3.375 g 100 mL/hr over 30 Minutes Intravenous  Once 05/09/18 2156 05/09/18 2202   05/09/18 2200  clindamycin (CLEOCIN) IVPB 600 mg  Status:  Discontinued     600 mg 100 mL/hr over 30 Minutes Intravenous Every 8 hours 05/09/18 2156 05/11/18 1552   05/09/18 2200  piperacillin-tazobactam (ZOSYN) IVPB 3.375 g     3.375 g 12.5 mL/hr over 240 Minutes Intravenous Every 8 hours 05/09/18 1717     05/09/18 1600  clindamycin (CLEOCIN) IVPB 600 mg     600 mg 100 mL/hr over 30 Minutes Intravenous  Once 05/09/18 1551 05/09/18 1657   05/09/18 1430  piperacillin-tazobactam (ZOSYN) IVPB 3.375 g     3.375 g 100 mL/hr over 30 Minutes Intravenous  Once 05/09/18 1420 05/09/18 1542   05/09/18 1430  vancomycin (VANCOCIN) IVPB 1000 mg/200 mL premix  Status:  Discontinued     1,000 mg 200 mL/hr over 60 Minutes Intravenous  Once 05/09/18 1420 05/09/18 1426   05/09/18 1430  vancomycin (VANCOCIN) 2,000 mg in sodium chloride 0.9 % 500 mL IVPB     2,000 mg 250 mL/hr over 120 Minutes Intravenous STAT 05/09/18 1426 05/09/18 1750       Assessment/Plan History of adrenal insufficiency, with hypotension shock-hospitalized 6/3-6/09/2018 Left basal ganglia and posterior left centrum ovale stroke 04/10/2018- On Plavix Interstitial lung disease secondary to rheumatoid arthritis and mold exposure Chronic respiratory failure previously on  home O2 History of COPD with tobacco use-discontinued smoking during his last hospitalization. History of CABG 1993 Dr. Sharmaine Base Chronic systolic and diastolic congestive heart failure, EF 30 to 35%, grade 2 diastolic dysfunction 04/05/2018 Hypertension AAA  Right buttocks abscesseswith possible osteomyelitis - s/p I&D 05/09/18 Dr. Gerrit Friends - POD#4 - WBC 8.0, afebrile  - cxs reincubated - appreciate ID guidance on abx - continue dressing changes BID with saline moistened kerlix  FEN: reg diet VTE: SCDs, lovenox ID: [email or web address] 7/8 >>    LOS: 4 days    Wells Guiles , [facility] 05/13/2018, 10:24 AM Pager: [phone] Consults: [phone] Mon-Fri 7:00 am-4:30 pm Sat-Sun 7:00 am-11:30 am

## 2018-05-14 DIAGNOSIS — I472 Ventricular tachycardia: Secondary | ICD-10-CM

## 2018-05-14 DIAGNOSIS — I4729 Other ventricular tachycardia: Secondary | ICD-10-CM

## 2018-05-14 DIAGNOSIS — B9689 Other specified bacterial agents as the cause of diseases classified elsewhere: Secondary | ICD-10-CM

## 2018-05-14 DIAGNOSIS — M8618 Other acute osteomyelitis, other site: Secondary | ICD-10-CM

## 2018-05-14 LAB — CULTURE, BLOOD (ROUTINE X 2)
Culture: NO GROWTH
Culture: NO GROWTH
Special Requests: ADEQUATE
Special Requests: ADEQUATE

## 2018-05-14 LAB — GLUCOSE, CAPILLARY
GLUCOSE-CAPILLARY: 81 mg/dL (ref 70–99)
Glucose-Capillary: 117 mg/dL — ABNORMAL HIGH (ref 70–99)
Glucose-Capillary: 127 mg/dL — ABNORMAL HIGH (ref 70–99)

## 2018-05-14 MED ORDER — NYSTATIN 100000 UNIT/GM EX CREA
TOPICAL_CREAM | Freq: Two times a day (BID) | CUTANEOUS | Status: DC
  Administered 2018-05-14 – 2018-05-18 (×9): via TOPICAL
  Filled 2018-05-14 (×2): qty 15

## 2018-05-14 NOTE — Progress Notes (Signed)
Patient ID: [name], male   DOB: Aug 04, 1956, 62 y.o.   MRN: [id number]          [facility]    Date of Admission:  05/09/2018   Day 6 piperacillin tazobactam         [name] had a large, multiloculated right buttock abscess drained.  The abscess tracked down to his right tibial tuberosity and CT scan confirmed early osteomyelitis.  Abscess cultures have grown multiple organisms.  I have asked our lab tech to work-up the individual organisms to help determine the most appropriate long-term therapy for osteomyelitis.  He is afebrile.  I will continue piperacillin tazobactam for now.         Ryan Asters, MD [facility] [phone] pager   [phone] cell 05/14/2018, 10:51 AM

## 2018-05-14 NOTE — Progress Notes (Signed)
PROGRESS NOTE    [name]  KCL:[id number] DOB: 1956/09/13 DOA: 05/09/2018 PCP: [name], PA-C    Brief Narrative:  62 year old male well-known to the [facility] pulmonary critical care medicine service because of his known COPD interstitial lung disease and connective tissue disease with chronic immunosuppression who returns to our facility complaining of right sided buttock pain.  He has had a wound which is being received dressing at his nursing facility for the last 2 weeks.  He says it has been bleeding intermittently and they have been giving him wound dressing changes there.  Today it bled more so they sent him here for further evaluation.  He had a CT scan which showed evidence of necrotizing skin infection so pulmonary and critical care medicine was asked to admit and surgery was consulted.  Patient seen by general surgery and subsequently underwent I and D on 05/19/2018 with cultures pending.  Patient placed empirically on IV vancomycin, IV Zosyn, IV clindamycin.  Patient subsequently transferred from the ICU and placed on telemetry.      Assessment & Plan:   Principal Problem:   Abscess of buttock Active Problems:   CAD (coronary artery disease)   ILD (interstitial lung disease) (HCC)   Anemia   HTN (hypertension)   S/P CABG (coronary artery bypass graft)   Chronic combined systolic and diastolic CHF (congestive heart failure) (HCC)   Hx of rheumatoid arthritis   Rheumatoid arthritis (HCC)   Chronic obstructive pulmonary disease (HCC)   Supplemental oxygen dependent   AAA (abdominal aortic aneurysm) without rupture (HCC)   History of CVA with residual deficit   Adrenal insufficiency (HCC): Relative   NSVT (nonsustained ventricular tachycardia) (HCC)  #1 right buttocks abscess with possible osteomyelitis Status post I&D 05/09/2018 per Dr. Gerrit Friends.  Patient currently afebrile.  Normal white count.  Cultures were obtained which are currently pending.  Continue  dressing changes with saline moist Kerlix twice daily per general surgery recommendations.  IV vancomycin IV clindamycin have been discontinued.  Patient currently on IV Zosyn.  Cultures pending.  ID following and I appreciate the input and recommendations.   2.  Chronic respiratory failure on home O2/COPD/interstitial lung disease Currently stable.  Immunosuppression on hold.  Continue nicotine patch and scheduled duo nebs.  Outpatient follow-up with pulmonary.  3.  Hypertension Blood pressure stable off stress dose steroids.  Follow.  4.  Rheumatoid arthritis Patient has been tapered off stress dose steroids and back to home dose prednisone.    5.  Relative adrenal insufficiency Patient was on stress dose steroids and has been subsequently transitioned back to home dose prednisone 10 mg daily.  Outpatient follow-up.    6.  History of recent left basal ganglia and posterior left centrum ovale CVA 04/10/2018. Continue Plavix for secondary stroke prophylaxis.  7.  AAA Outpatient follow-up.  8.  Chronic systolic and diastolic CHF EF 30 to 35% Euvolemic.  IV fluids saline lock.  Continue Coreg, Lipitor.    9.  Chronic anemia H&H stable.  No bleeding.   10 MASD to left buttocks, perineum, groin Nystatin.     DVT prophylaxis: Lovenox Code Status: Full Family Communication: Updated patient.  No family at bedside.   Disposition Plan: Skilled nursing facility when medically stable.     Consultants:   General surgery: Dr. Gerrit Friends 05/09/2018  PCCM admission  Procedures:   CT pelvis 05/19/2018  Chest x-ray 05/09/2018  I&D 05/09/2018 per Dr. Gerrit Friends  Antimicrobials:   IV clindamycin 05/09/2018>>>>> 05/11/2018  IV Zosyn 05/09/2018  IV Vanc 05/09/2018>>>>> 05/11/2018   Subjective: Patient in bed.  No chest pain or shortness of breath.  Complains of right hip pain.  States pain medication helps with hip pain.    Objective: Vitals:   05/13/18 2153 05/14/18 0600 05/14/18 0600 05/14/18  0927  BP: 117/68  125/81   Pulse: 72  97   Resp:   18   Temp: 98.1 F (36.7 C)  98.3 F (36.8 C)   TempSrc: Oral  Oral   SpO2: 98%  98% 90%  Weight:  93.1 kg (205 lb 4 oz)    Height:        Intake/Output Summary (Last 24 hours) at 05/14/2018 1200 Last data filed at 05/14/2018 1145 Gross per 24 hour  Intake 290 ml  Output 2050 ml  Net -1760 ml   Filed Weights   05/12/18 0507 05/13/18 0500 05/14/18 0600  Weight: 90.2 kg (198 lb 13.7 oz) 90 kg (198 lb 6.6 oz) 93.1 kg (205 lb 4 oz)    Examination:  General exam: NAD. Respiratory system: Bibasilar crackles.  No wheezing.  No rhonchi.  Respiratory effort normal. Cardiovascular system: Regular rate and rhythm no murmurs rubs or gallops.  No lower extremity edema.   Gastrointestinal system: Abdomen is soft/NT/ND/+BS. No organomegaly.  Central nervous system: Alert and oriented. No focal neurological deficits. Extremities: Symmetric 5 x 5 power. Skin: R gluteal wound with packing. Psychiatry: Judgement and insight appear normal. Mood & affect appropriate.     Data Reviewed: I have personally reviewed following labs and imaging studies  CBC: Recent Labs  Lab 05/09/18 1332 05/10/18 0358 05/11/18 0412 05/12/18 0421 05/13/18 0433  WBC 9.4 9.8 7.0 6.2 8.0  NEUTROABS 7.8*  --   --   --  5.6  HGB 11.0* 9.4* 8.5* 8.6* 8.9*  HCT 34.7* 30.1* 27.1* 27.9* 28.6*  MCV 86.1 87.5 88.6 89.4 90.5  PLT 167 161 200 252 278   Basic Metabolic Panel: Recent Labs  Lab 05/09/18 1332 05/10/18 0358 05/11/18 0412 05/12/18 0421 05/13/18 0433  NA 133* 137 136 139 140  K 4.0 4.4 4.8 4.6 4.2  CL 99 107 106 108 108  CO2 24 23 24 26 28   GLUCOSE 178* 192* 209* 166* 134*  BUN 19 16 18 19 21   CREATININE 0.77 0.75 0.66 0.65 0.69  CALCIUM 8.0* 7.8* 7.9* 8.2* 8.2*  MG  --   --   --   --  2.2   GFR: Estimated Creatinine Clearance: 108.2 mL/min (by C-G formula based on SCr of 0.69 mg/dL). Liver Function Tests: Recent Labs  Lab 05/09/18 1332    AST 30  ALT 29  ALKPHOS 127*  BILITOT 0.8  PROT 6.5  ALBUMIN 2.2*   No results for input(s): LIPASE, AMYLASE in the last 168 hours. No results for input(s): AMMONIA in the last 168 hours. Coagulation Profile: Recent Labs  Lab 05/09/18 1332  INR 1.08   Cardiac Enzymes: No results for input(s): CKTOTAL, CKMB, CKMBINDEX, TROPONINI in the last 168 hours. BNP (last 3 results) No results for input(s): PROBNP in the last 8760 hours. HbA1C: No results for input(s): HGBA1C in the last 72 hours. CBG: Recent Labs  Lab 05/13/18 1201 05/13/18 1713 05/13/18 2153 05/14/18 0742 05/14/18 1147  GLUCAP 109* 132* 108* 81 117*   Lipid Profile: No results for input(s): CHOL, HDL, LDLCALC, TRIG, CHOLHDL, LDLDIRECT in the last 72 hours. Thyroid Function Tests: No results for input(s): TSH, T4TOTAL, FREET4, T3FREE, THYROIDAB  in the last 72 hours. Anemia Panel: No results for input(s): VITAMINB12, FOLATE, FERRITIN, TIBC, IRON, RETICCTPCT in the last 72 hours. Sepsis Labs: Recent Labs  Lab 05/09/18 1329 05/09/18 1457 05/11/18 0822  LATICACIDVEN 2.54* 2.28* 1.7    Recent Results (from the past 240 hour(s))  Culture, blood (Routine x 2)     Status: None (Preliminary result)   Collection Time: 05/09/18  1:32 PM  Result Value Ref Range Status   Specimen Description   Final    BLOOD RIGHT HAND Performed at [facility], [address], [place], [place] [address]    Special Requests   Final    BOTTLES DRAWN AEROBIC AND ANAEROBIC Blood Culture adequate volume Performed at [facility], [address], [place], [place] [address]    Culture   Final    NO GROWTH 4 DAYS Performed at [facility], [address] [place], [place] [address]    Report Status PENDING  Incomplete  Culture, blood (Routine x 2)     Status: None (Preliminary result)   Collection Time: 05/09/18  1:35 PM  Result Value Ref Range Status   Specimen Description   Final     BLOOD RIGHT FOREARM Performed at [facility], [address] [place], [place] [address]    Special Requests   Final    BOTTLES DRAWN AEROBIC AND ANAEROBIC Blood Culture adequate volume Performed at [facility], [address] [place], [place] [address]    Culture   Final    NO GROWTH 4 DAYS Performed at [facility], [address], [place], [place] [address]    Report Status PENDING  Incomplete  Urine culture     Status: Abnormal   Collection Time: 05/09/18  3:46 PM  Result Value Ref Range Status   Specimen Description   Final    URINE, RANDOM Performed at [facility], [address], [place], [place] [address]    Special Requests   Final    NONE Performed at [facility], [address], [place], [place] [address]    Culture (A)  Final    <10,000 COLONIES/mL INSIGNIFICANT GROWTH Performed at [facility], [address], [place], [place] [address]    Report Status 05/10/2018 FINAL  Final  Aerobic/Anaerobic Culture (surgical/deep wound)     Status: Abnormal   Collection Time: 05/09/18  8:34 PM  Result Value Ref Range Status   Specimen Description   Final    ABSCESS Performed at [facility], [address], [place], [place] [address]    Special Requests   Final    PERIRECTAL Performed at [facility], [address], [place], [place] [address]    Gram Stain   Final    ABUNDANT WBC PRESENT, PREDOMINANTLY PMN MODERATE GRAM POSITIVE COCCI FEW GRAM NEGATIVE RODS Performed at [facility], [address] [place], [place] [address]    Culture (A)  Final    MULTIPLE ORGANISMS PRESENT, NONE PREDOMINANT MIXED ANAEROBIC FLORA PRESENT.  CALL LAB IF FURTHER IID REQUIRED.    Report Status 05/13/2018 FINAL  Final  MRSA PCR Screening     Status: None   Collection Time: 05/10/18 12:32 AM  Result Value Ref Range Status   MRSA by PCR NEGATIVE  NEGATIVE Final    Comment:        The GeneXpert MRSA Assay (FDA approved for NASAL  specimens only), is one component of a comprehensive MRSA colonization surveillance program. It is not intended to diagnose MRSA infection nor to guide or monitor treatment for MRSA infections. Performed at [facility], [address] [place], [place] [address]          Radiology Studies: No results found.      Scheduled Meds: . atorvastatin  40 mg Oral q1800  . carvedilol  6.25 mg Oral BID WC  . clopidogrel  75 mg Oral Daily  . enoxaparin (LOVENOX) injection  40 mg Subcutaneous Q24H  . feeding supplement (PRO-STAT SUGAR FREE 64)  30 mL Oral BID  . gabapentin  300 mg Oral Q8H  . HYDROcodone-acetaminophen  1 tablet Oral TID  . insulin aspart  0-9 Units Subcutaneous TID WC  . ipratropium-albuterol  3 mL Nebulization BID  . multivitamin with minerals  1 tablet Oral Daily  . nicotine  7 mg Transdermal Daily  . nutrition supplement (JUVEN)  1 packet Oral BID BM  . predniSONE  10 mg Oral Q breakfast  . saccharomyces boulardii  250 mg Oral BID   Continuous Infusions: . piperacillin-tazobactam (ZOSYN)  IV Stopped (05/14/18 1014)     LOS: 5 days    Time spent: 35 minutes    Ramiro Harvest, MD [facility] Pager [phone]  If 7PM-7AM, please contact night-coverage [email or web address] Password [email or web address] 05/14/2018, 12:00 PM

## 2018-05-14 NOTE — Plan of Care (Signed)
Patient stable, medicated multiple times throughout shift (standing pain medications as well as breakthrough pain medication) for pain in right buttock with some improvement.  Patient with extensive MASD to left buttock, perineum and groin discussed with MD, Nystatin ordered.  Sister did call to check on patient this shift.

## 2018-05-14 NOTE — Progress Notes (Cosign Needed)
Patient stated that his pain in his right buttock was a 9 on a scale of 1 to 10. He was given Vicodin and morphine to control his pain. The pain medicine helped enough to allow him to sleep. The patient is stable and cooperative with his care.

## 2018-05-15 LAB — BASIC METABOLIC PANEL
ANION GAP: 6 (ref 5–15)
BUN: 18 mg/dL (ref 8–23)
CALCIUM: 7.9 mg/dL — AB (ref 8.9–10.3)
CO2: 29 mmol/L (ref 22–32)
Chloride: 102 mmol/L (ref 98–111)
Creatinine, Ser: 0.65 mg/dL (ref 0.61–1.24)
Glucose, Bld: 97 mg/dL (ref 70–99)
Potassium: 3.5 mmol/L (ref 3.5–5.1)
Sodium: 137 mmol/L (ref 135–145)

## 2018-05-15 LAB — CBC WITH DIFFERENTIAL/PLATELET
BASOS ABS: 0 10*3/uL (ref 0.0–0.1)
BASOS PCT: 0 %
Band Neutrophils: 2 %
EOS ABS: 0.1 10*3/uL (ref 0.0–0.7)
Eosinophils Relative: 1 %
HCT: 32.4 % — ABNORMAL LOW (ref 39.0–52.0)
Hemoglobin: 10 g/dL — ABNORMAL LOW (ref 13.0–17.0)
LYMPHS PCT: 19 %
Lymphs Abs: 2.5 10*3/uL (ref 0.7–4.0)
MCH: 27.8 pg (ref 26.0–34.0)
MCHC: 30.9 g/dL (ref 30.0–36.0)
MCV: 90 fL (ref 78.0–100.0)
MONO ABS: 0.4 10*3/uL (ref 0.1–1.0)
Metamyelocytes Relative: 2 %
Monocytes Relative: 3 %
NEUTROS PCT: 73 %
Neutro Abs: 10.3 10*3/uL — ABNORMAL HIGH (ref 1.7–7.7)
PLATELETS: 282 10*3/uL (ref 150–400)
RBC: 3.6 MIL/uL — ABNORMAL LOW (ref 4.22–5.81)
RDW: 17.1 % — AB (ref 11.5–15.5)
WBC: 13.3 10*3/uL — ABNORMAL HIGH (ref 4.0–10.5)

## 2018-05-15 LAB — GLUCOSE, CAPILLARY
GLUCOSE-CAPILLARY: 123 mg/dL — AB (ref 70–99)
GLUCOSE-CAPILLARY: 148 mg/dL — AB (ref 70–99)
Glucose-Capillary: 81 mg/dL (ref 70–99)
Glucose-Capillary: 160 mg/dL — ABNORMAL HIGH (ref 70–99)

## 2018-05-15 MED ORDER — POTASSIUM CHLORIDE CRYS ER 20 MEQ PO TBCR
40.0000 meq | EXTENDED_RELEASE_TABLET | Freq: Once | ORAL | Status: AC
  Administered 2018-05-15: 40 meq via ORAL
  Filled 2018-05-15: qty 2

## 2018-05-15 NOTE — Progress Notes (Signed)
Patient with 3 beat run of V tach, asymptomatic.  MD notified of above, no new orders received.

## 2018-05-15 NOTE — Plan of Care (Signed)
Patient stable during 7 a to 7 p shift, medicated for pain scheduled and prn with some improvement.  Sister in to visit this shift.

## 2018-05-15 NOTE — Progress Notes (Signed)
Pharmacy Antibiotic Note  [name] is a 62 y.o. male admitted on 05/09/2018 with infected right buttock wound concerning for necrotizing infection.  Pharmacy has been consulted for zosyn dosing. 05/15/2018 D6 full abx WBC 13.3, AF.  Plan: Zosyn 3.375g IV q8h (4 hour infusion).  F/u renal fxn, WBC, temp, culture data F/u ID recs  Height: 6\' 1"  (185.4 cm) Weight: 205 lb 4 oz (93.1 kg) IBW/kg (Calculated) : 79.9  Temp (24hrs), Avg:98.4 F (36.9 C), Min:97.6 F (36.4 C), Max:98.9 F (37.2 C)  Recent Labs  Lab 05/09/18 1329  05/09/18 1457 05/10/18 0358 05/11/18 0412 05/11/18 0822 05/12/18 0421 05/13/18 0433 05/15/18 0413  WBC  --    < >  --  9.8 7.0  --  6.2 8.0 13.3*  CREATININE  --    < >  --  0.75 0.66  --  0.65 0.69 0.65  LATICACIDVEN 2.54*  --  2.28*  --   --  1.7  --   --   --    < > = values in this interval not displayed.    Estimated Creatinine Clearance: 108.2 mL/min (by C-G formula based on SCr of 0.65 mg/dL).    No Known Allergies Antimicrobials this admission: 7/8 vanc >>7/10 7/8 zosyn >> 7/8 clinda >>7/10 Dose adjustments this admission: Microbiology results: 7/8 Ucx: insig growth 7/8 BCx: NGF 7/8 MRSA PCR: negative 7/8 Abscess/Wound: multiple org, Moderate Gr+ cocci, few Gr- rod  Thank you for allowing pharmacy to be a part of this patient's care.   9/8, PharmD, BCPS Pager [phone] 05/15/2018 4:48 PM

## 2018-05-15 NOTE — Progress Notes (Signed)
PROGRESS NOTE    [name]  UDJ:[id number] DOB: 1955-11-28 DOA: 05/09/2018 PCP: [name], PA-C    Brief Narrative:  62 year old male well-known to the [place] pulmonary critical care medicine service because of his known COPD interstitial lung disease and connective tissue disease with chronic immunosuppression who returns to our facility complaining of right sided buttock pain.  He has had a wound which is being received dressing at his nursing facility for the last 2 weeks.  He says it has been bleeding intermittently and they have been giving him wound dressing changes there.  Today it bled more so they sent him here for further evaluation.  He had a CT scan which showed evidence of necrotizing skin infection so pulmonary and critical care medicine was asked to admit and surgery was consulted.  Patient seen by general surgery and subsequently underwent I and D on 05/19/2018 with cultures pending.  Patient placed empirically on IV vancomycin, IV Zosyn, IV clindamycin.  Patient subsequently transferred from the ICU and placed on telemetry.      Assessment & Plan:   Principal Problem:   Abscess of buttock Active Problems:   CAD (coronary artery disease)   ILD (interstitial lung disease) (HCC)   Anemia   HTN (hypertension)   S/P CABG (coronary artery bypass graft)   Chronic combined systolic and diastolic CHF (congestive heart failure) (HCC)   Hx of rheumatoid arthritis   Rheumatoid arthritis (HCC)   Chronic obstructive pulmonary disease (HCC)   Supplemental oxygen dependent   AAA (abdominal aortic aneurysm) without rupture (HCC)   History of CVA with residual deficit   Adrenal insufficiency (HCC): Relative   NSVT (nonsustained ventricular tachycardia) (HCC)  1 right buttocks abscess with possible osteomyelitis Status post I&D 05/09/2018 per Dr. Gerrit Friends.  Patient currently afebrile.  Normal white count.  Cultures were obtained which are currently pending.  Continue  dressing changes with saline moist Kerlix twice daily per general surgery recommendations.  IV vancomycin and IV clindamycin have been discontinued.  Patient currently on IV Zosyn.  Cultures pending.  ID following and I appreciate the input and recommendations.   2.  Chronic respiratory failure on home O2/COPD/interstitial lung disease Currently stable.  Immunosuppression on hold.  Continue nicotine patch and scheduled duo nebs.  Outpatient follow-up with pulmonary.  3.  Hypertension Monitor blood pressure of stress dose steroids.  Follow.  4.  Rheumatoid arthritis Patient has been tapered off stress dose steroids and back to home dose prednisone.    5.  Relative adrenal insufficiency Patient was on stress dose steroids and has been subsequently transitioned back to home dose prednisone 10 mg daily.  Outpatient follow-up.    6.  History of recent left basal ganglia and posterior left centrum ovale CVA 04/10/2018. Stable.  Plavix for secondary stroke prophylaxis.   7.  AAA Outpatient follow-up.  8.  Chronic systolic and diastolic CHF EF 30 to 35% Euvolemic.  IV fluids saline lock.  Continue Coreg, Lipitor.    9.  Chronic anemia H&H stable.  No bleeding.   10 MASD to left buttocks, perineum, groin Continue nystatin.     DVT prophylaxis: Lovenox Code Status: Full Family Communication: Updated patient and family at bedside. Disposition Plan: Skilled nursing facility when medically stable.     Consultants:   General surgery: Dr. Gerrit Friends 05/09/2018  PCCM admission  Procedures:   CT pelvis 05/19/2018  Chest x-ray 05/09/2018  I&D 05/09/2018 per Dr. Gerrit Friends  Antimicrobials:   IV clindamycin 05/09/2018>>>>>  05/11/2018  IV Zosyn 05/09/2018  IV Vanc 05/09/2018>>>>> 05/11/2018   Subjective: Patient in bed.  Denies any shortness of breath or chest pain.  Still with right hip pain which per patient managed on current pain regimen.    Objective: Vitals:   05/14/18 1921 05/14/18 2054  05/15/18 0509 05/15/18 0915  BP:  122/77 114/72   Pulse:  73 91   Resp:  18 14   Temp:  97.6 F (36.4 C) 98.9 F (37.2 C)   TempSrc:      SpO2: 95% 99% 96% 93%  Weight:      Height:        Intake/Output Summary (Last 24 hours) at 05/15/2018 1203 Last data filed at 05/15/2018 0600 Gross per 24 hour  Intake 1397.71 ml  Output 2150 ml  Net -752.29 ml   Filed Weights   05/12/18 0507 05/13/18 0500 05/14/18 0600  Weight: 90.2 kg (198 lb 13.7 oz) 90 kg (198 lb 6.6 oz) 93.1 kg (205 lb 4 oz)    Examination:  General exam: NAD. Respiratory system: Bibasilar fine crackles.  No wheezing.  No rhonchi.  Normal respiratory effort.   Cardiovascular system: RRR no murmurs rubs or gallops.  No lower extremity edema.    Gastrointestinal system: Abdomen is soft/NT/ND/+BS. No organomegaly.  Central nervous system: Alert and oriented. No focal neurological deficits. Extremities: Symmetric 5 x 5 power. Skin: R gluteal wound with packing. Psychiatry: Judgement and insight appear normal. Mood & affect appropriate.     Data Reviewed: I have personally reviewed following labs and imaging studies  CBC: Recent Labs  Lab 05/09/18 1332 05/10/18 0358 05/11/18 0412 05/12/18 0421 05/13/18 0433 05/15/18 0413  WBC 9.4 9.8 7.0 6.2 8.0 13.3*  NEUTROABS 7.8*  --   --   --  5.6 10.3*  HGB 11.0* 9.4* 8.5* 8.6* 8.9* 10.0*  HCT 34.7* 30.1* 27.1* 27.9* 28.6* 32.4*  MCV 86.1 87.5 88.6 89.4 90.5 90.0  PLT 167 161 200 252 278 282   Basic Metabolic Panel: Recent Labs  Lab 05/10/18 0358 05/11/18 0412 05/12/18 0421 05/13/18 0433 05/15/18 0413  NA 137 136 139 140 137  K 4.4 4.8 4.6 4.2 3.5  CL 107 106 108 108 102  CO2 23 24 26 28 29   GLUCOSE 192* 209* 166* 134* 97  BUN 16 18 19 21 18   CREATININE 0.75 0.66 0.65 0.69 0.65  CALCIUM 7.8* 7.9* 8.2* 8.2* 7.9*  MG  --   --   --  2.2  --    GFR: Estimated Creatinine Clearance: 108.2 mL/min (by C-G formula based on SCr of 0.65 mg/dL). Liver Function  Tests: Recent Labs  Lab 05/09/18 1332  AST 30  ALT 29  ALKPHOS 127*  BILITOT 0.8  PROT 6.5  ALBUMIN 2.2*   No results for input(s): LIPASE, AMYLASE in the last 168 hours. No results for input(s): AMMONIA in the last 168 hours. Coagulation Profile: Recent Labs  Lab 05/09/18 1332  INR 1.08   Cardiac Enzymes: No results for input(s): CKTOTAL, CKMB, CKMBINDEX, TROPONINI in the last 168 hours. BNP (last 3 results) No results for input(s): PROBNP in the last 8760 hours. HbA1C: No results for input(s): HGBA1C in the last 72 hours. CBG: Recent Labs  Lab 05/14/18 0742 05/14/18 1147 05/14/18 2114 05/15/18 0748 05/15/18 1118  GLUCAP 81 117* 127* 81 123*   Lipid Profile: No results for input(s): CHOL, HDL, LDLCALC, TRIG, CHOLHDL, LDLDIRECT in the last 72 hours. Thyroid Function Tests: No results for  input(s): TSH, T4TOTAL, FREET4, T3FREE, THYROIDAB in the last 72 hours. Anemia Panel: No results for input(s): VITAMINB12, FOLATE, FERRITIN, TIBC, IRON, RETICCTPCT in the last 72 hours. Sepsis Labs: Recent Labs  Lab 05/09/18 1329 05/09/18 1457 05/11/18 0822  LATICACIDVEN 2.54* 2.28* 1.7    Recent Results (from the past 240 hour(s))  Culture, blood (Routine x 2)     Status: None   Collection Time: 05/09/18  1:32 PM  Result Value Ref Range Status   Specimen Description   Final    BLOOD RIGHT HAND Performed at [facility], [address] [place], [place] [address]    Special Requests   Final    BOTTLES DRAWN AEROBIC AND ANAEROBIC Blood Culture adequate volume Performed at [facility], [address] [place], [place] [address]    Culture   Final    NO GROWTH 5 DAYS Performed at [facility], [address] [place], [place] [address]    Report Status 05/14/2018 FINAL  Final  Culture, blood (Routine x 2)     Status: None   Collection Time: 05/09/18  1:35 PM  Result Value Ref Range Status   Specimen Description   Final     BLOOD RIGHT FOREARM Performed at [facility], [address], [place], [place] [address]    Special Requests   Final    BOTTLES DRAWN AEROBIC AND ANAEROBIC Blood Culture adequate volume Performed at [facility], [address] [place], [place] [address]    Culture   Final    NO GROWTH 5 DAYS Performed at [facility], [address] [place], [place] [address]    Report Status 05/14/2018 FINAL  Final  Urine culture     Status: Abnormal   Collection Time: 05/09/18  3:46 PM  Result Value Ref Range Status   Specimen Description   Final    URINE, RANDOM Performed at [facility], [address] [place], [place] [address]    Special Requests   Final    NONE Performed at [facility], [address] [place], [place] [address]    Culture (A)  Final    <10,000 COLONIES/mL INSIGNIFICANT GROWTH Performed at [facility], [address] [place], [place] [address]    Report Status 05/10/2018 FINAL  Final  Aerobic/Anaerobic Culture (surgical/deep wound)     Status: None (Preliminary result)   Collection Time: 05/09/18  8:34 PM  Result Value Ref Range Status   Specimen Description   Final    ABSCESS Performed at [facility], [address] [place], [place] [address]    Special Requests   Final    PERIRECTAL Performed at [facility], [address] [place], [place] [address]    Gram Stain   Final    ABUNDANT WBC PRESENT, PREDOMINANTLY PMN MODERATE GRAM POSITIVE COCCI FEW GRAM NEGATIVE RODS    Culture   Final    CULTURE REINCUBATED FOR BETTER GROWTH Performed at [facility], [address], [place], [place] [address]    Report Status PENDING  Incomplete  MRSA PCR Screening     Status: None   Collection Time: 05/10/18 12:32 AM  Result Value Ref Range Status   MRSA by PCR NEGATIVE NEGATIVE Final    Comment:        The GeneXpert MRSA Assay  (FDA approved for NASAL specimens only), is one component of  a comprehensive MRSA colonization surveillance program. It is not intended to diagnose MRSA infection nor to guide or monitor treatment for MRSA infections. Performed at [facility], [address] [place], [place] [address]          Radiology Studies: No results found.      Scheduled Meds: . atorvastatin  40 mg Oral q1800  . carvedilol  6.25 mg Oral BID WC  . clopidogrel  75 mg Oral Daily  . enoxaparin (LOVENOX) injection  40 mg Subcutaneous Q24H  . feeding supplement (PRO-STAT SUGAR FREE 64)  30 mL Oral BID  . gabapentin  300 mg Oral Q8H  . HYDROcodone-acetaminophen  1 tablet Oral TID  . insulin aspart  0-9 Units Subcutaneous TID WC  . ipratropium-albuterol  3 mL Nebulization BID  . multivitamin with minerals  1 tablet Oral Daily  . nicotine  7 mg Transdermal Daily  . nutrition supplement (JUVEN)  1 packet Oral BID BM  . nystatin cream   Topical BID  . predniSONE  10 mg Oral Q breakfast  . saccharomyces boulardii  250 mg Oral BID   Continuous Infusions: . piperacillin-tazobactam (ZOSYN)  IV Stopped (05/15/18 2409)     LOS: 6 days    Time spent: 35 minutes    Ramiro Harvest, MD [facility] Pager [phone]  If 7PM-7AM, please contact night-coverage [email or web address] Password [facility] 05/15/2018, 12:03 PM

## 2018-05-16 ENCOUNTER — Inpatient Hospital Stay: Payer: Self-pay

## 2018-05-16 LAB — CBC
HCT: 28.8 % — ABNORMAL LOW (ref 39.0–52.0)
HEMOGLOBIN: 9 g/dL — AB (ref 13.0–17.0)
MCH: 28.2 pg (ref 26.0–34.0)
MCHC: 31.3 g/dL (ref 30.0–36.0)
MCV: 90.3 fL (ref 78.0–100.0)
Platelets: 222 10*3/uL (ref 150–400)
RBC: 3.19 MIL/uL — ABNORMAL LOW (ref 4.22–5.81)
RDW: 16.9 % — AB (ref 11.5–15.5)
WBC: 9.3 10*3/uL (ref 4.0–10.5)

## 2018-05-16 LAB — GLUCOSE, CAPILLARY
Glucose-Capillary: 168 mg/dL — ABNORMAL HIGH (ref 70–99)
Glucose-Capillary: 96 mg/dL (ref 70–99)
Glucose-Capillary: 124 mg/dL — ABNORMAL HIGH (ref 70–99)
Glucose-Capillary: 148 mg/dL — ABNORMAL HIGH (ref 70–99)
Glucose-Capillary: 91 mg/dL (ref 70–99)

## 2018-05-16 LAB — BASIC METABOLIC PANEL
Anion gap: 7 (ref 5–15)
BUN: 17 mg/dL (ref 8–23)
CHLORIDE: 106 mmol/L (ref 98–111)
CO2: 24 mmol/L (ref 22–32)
Calcium: 7.6 mg/dL — ABNORMAL LOW (ref 8.9–10.3)
Creatinine, Ser: 0.59 mg/dL — ABNORMAL LOW (ref 0.61–1.24)
GFR calc Af Amer: >60 mL/min (ref >60)
GFR calc non Af Amer: >60 mL/min (ref >60)
GLUCOSE: 134 mg/dL — AB (ref 70–99)
Potassium: 3.5 mmol/L (ref 3.5–5.1)
Sodium: 137 mmol/L (ref 135–145)

## 2018-05-16 MED ORDER — POTASSIUM CHLORIDE CRYS ER 20 MEQ PO TBCR
40.0000 meq | EXTENDED_RELEASE_TABLET | Freq: Every day | ORAL | Status: DC
  Administered 2018-05-17 – 2018-05-18 (×2): 40 meq via ORAL
  Filled 2018-05-16 (×2): qty 2

## 2018-05-16 MED ORDER — SODIUM CHLORIDE 0.9 % IV SOLN
1.0000 g | INTRAVENOUS | Status: DC
  Administered 2018-05-16 – 2018-05-18 (×3): 1000 mg via INTRAVENOUS
  Filled 2018-05-16 (×3): qty 1

## 2018-05-16 MED ORDER — SODIUM CHLORIDE 0.9% FLUSH
10.0000 mL | INTRAVENOUS | Status: DC | PRN
  Administered 2018-05-17: 10 mL
  Filled 2018-05-16: qty 40

## 2018-05-16 MED ORDER — POTASSIUM CHLORIDE CRYS ER 20 MEQ PO TBCR
40.0000 meq | EXTENDED_RELEASE_TABLET | ORAL | Status: AC
  Administered 2018-05-16 (×2): 40 meq via ORAL
  Filled 2018-05-16 (×2): qty 2

## 2018-05-16 MED ORDER — MORPHINE SULFATE 15 MG PO TABS
15.0000 mg | ORAL_TABLET | ORAL | Status: DC | PRN
  Administered 2018-05-16: 30 mg via ORAL
  Administered 2018-05-16: 15 mg via ORAL
  Administered 2018-05-17: 30 mg via ORAL
  Filled 2018-05-16 (×3): qty 2

## 2018-05-16 MED ORDER — SODIUM CHLORIDE 0.9% FLUSH
10.0000 mL | Freq: Two times a day (BID) | INTRAVENOUS | Status: DC
  Administered 2018-05-16 – 2018-05-17 (×2): 10 mL

## 2018-05-16 NOTE — Progress Notes (Signed)
CSW following for discharge to SNF. 

## 2018-05-16 NOTE — Discharge Instructions (Signed)
WOUND CARE: - dressing to be changed at least twice daily - more often if soiled with BM - supplies: sterile saline, kerlix, scissors, ABD pads, tape  - remove dressing and all packing carefully, moistening with sterile saline as needed to avoid packing/internal dressing sticking to the wound. - clean edges of skin around the wound with water/gauze, making sure there is no tape debris or leakage left on skin that could cause skin irritation or breakdown. - dampen and clean kerlix with sterile saline and pack wound from wound base to skin level, making sure to take note of any possible areas of wound tracking, tunneling and packing appropriately. Wound can be packed loosely. Trim kerlix to size if a whole kerlix is not required. - cover wound with a dry ABD pad and secure with tape.  - write the date/time on the dry dressing/tape to better track when the last dressing change occurred. - apply any skin protectant/powder recommended by clinician to protect skin/skin folds. - change dressing as needed if leakage occurs, wound gets contaminated, or patient requests to shower. - patient may shower daily with wound open and following the shower the wound should be dried and a clean dressing placed.

## 2018-05-16 NOTE — Progress Notes (Signed)
PROGRESS NOTE    [name]  KPT:[id number] DOB: [birth year] DOA: 05/09/2018 PCP: [name], PA-C    Brief Narrative:  62 year old male well-known to the [place] pulmonary critical care medicine service because of his known COPD interstitial lung disease and connective tissue disease with chronic immunosuppression who returns to our facility complaining of right sided buttock pain.  He has had a wound which is being received dressing at his nursing facility for the last 2 weeks.  He says it has been bleeding intermittently and they have been giving him wound dressing changes there.  Today it bled more so they sent him here for further evaluation.  He had a CT scan which showed evidence of necrotizing skin infection so pulmonary and critical care medicine was asked to admit and surgery was consulted.  Patient seen by general surgery and subsequently underwent I and D on 05/19/2018 with cultures pending.  Patient placed empirically on IV vancomycin, IV Zosyn, IV clindamycin.  Patient subsequently transferred from the ICU and placed on telemetry.      Assessment & Plan:   Principal Problem:   Abscess of buttock Active Problems:   CAD (coronary artery disease)   ILD (interstitial lung disease) (HCC)   Anemia   HTN (hypertension)   S/P CABG (coronary artery bypass graft)   Chronic combined systolic and diastolic CHF (congestive heart failure) (HCC)   Hx of rheumatoid arthritis   Rheumatoid arthritis (HCC)   Chronic obstructive pulmonary disease (HCC)   Supplemental oxygen dependent   AAA (abdominal aortic aneurysm) without rupture (HCC)   History of CVA with residual deficit   Adrenal insufficiency (HCC): Relative   NSVT (nonsustained ventricular tachycardia) (HCC)  1 right buttocks abscess with possible osteomyelitis Status post I&D 05/09/2018 per Dr. Gerrit Friends.  Patient currently afebrile.  Normal white count.  Cultures were obtained with preliminary multiple organisms  including Eikenella corrodens, Citrobacter braakiii.  Continue dressing changes with saline moist Kerlix twice daily per general surgery recommendations.  IV vancomycin and IV clindamycin have been discontinued.  Patient currently on IV Zosyn.  ID following and I appreciate the input and recommendations.   2.  Chronic respiratory failure on home O2/COPD/interstitial lung disease Currently stable.  Immunosuppression on hold.  Continue nicotine patch and scheduled duo nebs.  Outpatient follow-up with pulmonary.  3.  Hypertension Blood pressure stable off stress dose steroids.  Monitor.    4.  Rheumatoid arthritis Patient has been tapered off stress dose steroids and back to home dose prednisone.    5.  Relative adrenal insufficiency Patient was on stress dose steroids and has been subsequently transitioned back to home dose prednisone 10 mg daily.  Outpatient follow-up.    6.  History of recent left basal ganglia and posterior left centrum ovale CVA 04/10/2018. Continue Plavix for secondary stroke prophylaxis.     7.  AAA Outpatient follow-up.  8.  Chronic systolic and diastolic CHF EF 30 to 35% Euvolemic.  IV fluids saline lock.  Continue Coreg, Lipitor.    9.  Chronic anemia Hemoglobin stable.  Follow.   10 MASD to left buttocks, perineum, groin Continue nystatin.  11.  Nonsustained V. tach Patient noted to have some runs of V. tach yesterday and patient was asymptomatic.  Keep potassium greater than 4.  Keep magnesium greater than 2.  We will give a dose of K-Dur 40 p.o. x1.  Continue current increased dose beta-blocker.     DVT prophylaxis: Lovenox Code Status: Full Family Communication:  Updated patient. No family at bedside. Disposition Plan: Skilled nursing facility when medically stable and when bed available hopefully tomorrow 05/17/2018.     Consultants:   General surgery: Dr. Gerrit Friends 05/09/2018  PCCM admission  Procedures:   CT pelvis 05/19/2018  Chest x-ray  05/09/2018  I&D 05/09/2018 per Dr. Gerrit Friends  Antimicrobials:   IV clindamycin 05/09/2018>>>>> 05/11/2018  IV Zosyn 05/09/2018  IV Vanc 05/09/2018>>>>> 05/11/2018   Subjective: Patient in bed.  No chest pain no shortness of breath.  Right hip pain which per patient pain medicine helps with the when needed.    Objective: Vitals:   05/15/18 2109 05/15/18 2109 05/16/18 0443 05/16/18 0724  BP:  100/71 107/64   Pulse:  79 72   Resp:  20 18   Temp:  97.8 F (36.6 C) 98.2 F (36.8 C)   TempSrc:  Oral Oral   SpO2: 97% 97% 95% 96%  Weight:      Height:        Intake/Output Summary (Last 24 hours) at 05/16/2018 0956 Last data filed at 05/16/2018 0653 Gross per 24 hour  Intake 1285.21 ml  Output 2000 ml  Net -714.79 ml   Filed Weights   05/12/18 0507 05/13/18 0500 05/14/18 0600  Weight: 90.2 kg (198 lb 13.7 oz) 90 kg (198 lb 6.6 oz) 93.1 kg (205 lb 4 oz)    Examination:  General exam: NAD. Respiratory system: Bibasilar fine crackles.  No wheezing.  No rhonchi.   Cardiovascular system: Regular rate and rhythm no murmurs rubs or gallops.  No lower extremity edema.   Gastrointestinal system: Abdomen is nontender, nondistended, soft, positive bowel sounds.  No hepatosplenomegaly.  Central nervous system: Alert and oriented. No focal neurological deficits. Extremities: Symmetric 5 x 5 power. Skin: R gluteal wound with packing. Psychiatry: Judgement and insight appear normal. Mood & affect appropriate.     Data Reviewed: I have personally reviewed following labs and imaging studies  CBC: Recent Labs  Lab 05/09/18 1332  05/11/18 0412 05/12/18 0421 05/13/18 0433 05/15/18 0413 05/16/18 0428  WBC 9.4   < > 7.0 6.2 8.0 13.3* 9.3  NEUTROABS 7.8*  --   --   --  5.6 10.3*  --   HGB 11.0*   < > 8.5* 8.6* 8.9* 10.0* 9.0*  HCT 34.7*   < > 27.1* 27.9* 28.6* 32.4* 28.8*  MCV 86.1   < > 88.6 89.4 90.5 90.0 90.3  PLT 167   < > 200 252 278 282 222   < > = values in this interval not displayed.    Basic Metabolic Panel: Recent Labs  Lab 05/11/18 0412 05/12/18 0421 05/13/18 0433 05/15/18 0413 05/16/18 0428  NA 136 139 140 137 137  K 4.8 4.6 4.2 3.5 3.5  CL 106 108 108 102 106  CO2 24 26 28 29 24   GLUCOSE 209* 166* 134* 97 134*  BUN 18 19 21 18 17   CREATININE 0.66 0.65 0.69 0.65 0.59*  CALCIUM 7.9* 8.2* 8.2* 7.9* 7.6*  MG  --   --  2.2  --   --    GFR: Estimated Creatinine Clearance: 108.2 mL/min (A) (by C-G formula based on SCr of 0.59 mg/dL (L)). Liver Function Tests: Recent Labs  Lab 05/09/18 1332  AST 30  ALT 29  ALKPHOS 127*  BILITOT 0.8  PROT 6.5  ALBUMIN 2.2*   No results for input(s): LIPASE, AMYLASE in the last 168 hours. No results for input(s): AMMONIA in the last 168 hours.  Coagulation Profile: Recent Labs  Lab 05/09/18 1332  INR 1.08   Cardiac Enzymes: No results for input(s): CKTOTAL, CKMB, CKMBINDEX, TROPONINI in the last 168 hours. BNP (last 3 results) No results for input(s): PROBNP in the last 8760 hours. HbA1C: No results for input(s): HGBA1C in the last 72 hours. CBG: Recent Labs  Lab 05/15/18 0748 05/15/18 1118 05/15/18 1719 05/15/18 2104 05/16/18 0737  GLUCAP 81 123* 160* 148* 96   Lipid Profile: No results for input(s): CHOL, HDL, LDLCALC, TRIG, CHOLHDL, LDLDIRECT in the last 72 hours. Thyroid Function Tests: No results for input(s): TSH, T4TOTAL, FREET4, T3FREE, THYROIDAB in the last 72 hours. Anemia Panel: No results for input(s): VITAMINB12, FOLATE, FERRITIN, TIBC, IRON, RETICCTPCT in the last 72 hours. Sepsis Labs: Recent Labs  Lab 05/09/18 1329 05/09/18 1457 05/11/18 0822  LATICACIDVEN 2.54* 2.28* 1.7    Recent Results (from the past 240 hour(s))  Culture, blood (Routine x 2)     Status: None   Collection Time: 05/09/18  1:32 PM  Result Value Ref Range Status   Specimen Description   Final    BLOOD RIGHT HAND Performed at [facility], [address], [place], [place] [address]     Special Requests   Final    BOTTLES DRAWN AEROBIC AND ANAEROBIC Blood Culture adequate volume Performed at [facility], [address] [place], [place] [address]    Culture   Final    NO GROWTH 5 DAYS Performed at [facility], [address], [place], [place] [address]    Report Status 05/14/2018 FINAL  Final  Culture, blood (Routine x 2)     Status: None   Collection Time: 05/09/18  1:35 PM  Result Value Ref Range Status   Specimen Description   Final    BLOOD RIGHT FOREARM Performed at [facility], [address], [place], [place] [address]    Special Requests   Final    BOTTLES DRAWN AEROBIC AND ANAEROBIC Blood Culture adequate volume Performed at [facility], [address] [place], [place] [address]    Culture   Final    NO GROWTH 5 DAYS Performed at [facility], [address], [place], [place] [address]    Report Status 05/14/2018 FINAL  Final  Urine culture     Status: Abnormal   Collection Time: 05/09/18  3:46 PM  Result Value Ref Range Status   Specimen Description   Final    URINE, RANDOM Performed at [facility], [address], [place], [place] [address]    Special Requests   Final    NONE Performed at [facility], [address] [place], [place] [address]    Culture (A)  Final    <10,000 COLONIES/mL INSIGNIFICANT GROWTH Performed at [facility], [address] [place], [place] [address]    Report Status 05/10/2018 FINAL  Final  Aerobic/Anaerobic Culture (surgical/deep wound)     Status: None (Preliminary result)   Collection Time: 05/09/18  8:34 PM  Result Value Ref Range Status   Specimen Description   Final    ABSCESS Performed at [facility], [address] [place], [place] [address]    Special Requests   Final    PERIRECTAL Performed at [facility], [address] [place], [place]  [address]    Gram Stain   Final    ABUNDANT WBC PRESENT, PREDOMINANTLY PMN  MODERATE GRAM POSITIVE COCCI FEW GRAM NEGATIVE RODS    Culture   Final    MODERATE GRAM NEGATIVE RODS MODERATE GRAM POSITIVE COCCI MODERATE BACTEROIDES THETAIOTAOMICRON BETA LACTAMASE POSITIVE FEW EIKENELLA CORRODENS Usually susceptible to penicillin and other beta lactam agents,quinolones,macrolides and tetracyclines. Performed at [facility], [address] [place], [place] [address]    Report Status PENDING  Incomplete  MRSA PCR Screening     Status: None   Collection Time: 05/10/18 12:32 AM  Result Value Ref Range Status   MRSA by PCR NEGATIVE NEGATIVE Final    Comment:        The GeneXpert MRSA Assay (FDA approved for NASAL specimens only), is one component of a comprehensive MRSA colonization surveillance program. It is not intended to diagnose MRSA infection nor to guide or monitor treatment for MRSA infections. Performed at [facility], [address], [place], [place] [address]          Radiology Studies: No results found.      Scheduled Meds: . atorvastatin  40 mg Oral q1800  . carvedilol  6.25 mg Oral BID WC  . clopidogrel  75 mg Oral Daily  . enoxaparin (LOVENOX) injection  40 mg Subcutaneous Q24H  . feeding supplement (PRO-STAT SUGAR FREE 64)  30 mL Oral BID  . gabapentin  300 mg Oral Q8H  . HYDROcodone-acetaminophen  1 tablet Oral TID  . insulin aspart  0-9 Units Subcutaneous TID WC  . ipratropium-albuterol  3 mL Nebulization BID  . multivitamin with minerals  1 tablet Oral Daily  . nicotine  7 mg Transdermal Daily  . nutrition supplement (JUVEN)  1 packet Oral BID BM  . nystatin cream   Topical BID  . potassium chloride  40 mEq Oral Q4H  . predniSONE  10 mg Oral Q breakfast  . saccharomyces boulardii  250 mg Oral BID   Continuous Infusions: . piperacillin-tazobactam (ZOSYN)  IV 3.375 g (05/16/18 1583)     LOS: 7 days    Time spent: 35  minutes    Ramiro Harvest, MD [facility] Pager [phone]  If 7PM-7AM, please contact night-coverage [email or web address] Password [facility] 05/16/2018, 9:56 AM

## 2018-05-16 NOTE — Progress Notes (Signed)
CSW contacted by patient's sister and informed that she visited [facility] and would like for patient to discharge there when medically stable.  CSW spoke with [facility], staff confirmed bed offer.  CSW will continue to follow and assist with discharge planning to SNF when patient is medically stable.  Celso Sickle, [place] Clinical Social Worker [facility] Cell#: [phone]

## 2018-05-16 NOTE — Progress Notes (Signed)
PHARMACY CONSULT NOTE FOR:  OUTPATIENT  PARENTERAL ANTIBIOTIC THERAPY (OPAT)  Indication: Wound infection Regimen: Ertapenem 1gm IV q24hr End date: 06/19/2018  IV antibiotic discharge orders are pended. To discharging provider:  please sign these orders via discharge navigator,  Select New Orders & click on the button choice - Manage This Unsigned Work.     Thank you for allowing pharmacy to be a part of this patient's care.  Otho Bellows 05/16/2018, 2:49 PM

## 2018-05-16 NOTE — Progress Notes (Signed)
[facility] Progress Note  7 Days Post-Op  Subjective: CC: wound Pain with dressing changes, otherwise doing well.   Objective: Vital signs in last 24 hours: Temp:  [97.8 F (36.6 C)-98.7 F (37.1 C)] 98.2 F (36.8 C) (07/15 0443) Pulse Rate:  [72-90] 72 (07/15 0443) Resp:  [16-20] 18 (07/15 0443) BP: (100-107)/(64-71) 107/64 (07/15 0443) SpO2:  [95 %-97 %] 96 % (07/15 0724) Last BM Date: 05/14/18  Intake/Output from previous day: 07/14 0701 - 07/15 0700 In: 1285.2 [P.O.:1140; IV Piggyback:145.2] Out: 2000 [Urine:2000] Intake/Output this shift: Total I/O In: 120 [P.O.:120] Out: -   PE: R gluteal wound as below with superomedial tract extending 4.5 cm, superolateral tract extending 5 cm and inferior tract extending 7 cm. Small amount thin beige drainage. Exposed muscle/tendon. Skin irritation around wound bed.      Lab Results:  Recent Labs    05/15/18 0413 05/16/18 0428  WBC 13.3* 9.3  HGB 10.0* 9.0*  HCT 32.4* 28.8*  PLT 282 222   BMET Recent Labs    05/15/18 0413 05/16/18 0428  NA 137 137  K 3.5 3.5  CL 102 106  CO2 29 24  GLUCOSE 97 134*  BUN 18 17  CREATININE 0.65 0.59*  CALCIUM 7.9* 7.6*   PT/INR No results for input(s): LABPROT, INR in the last 72 hours. CMP     Component Value Date/Time   NA 137 05/16/2018 0428   NA 142 02/08/2018 1422   K 3.5 05/16/2018 0428   CL 106 05/16/2018 0428   CO2 24 05/16/2018 0428   GLUCOSE 134 (H) 05/16/2018 0428   BUN 17 05/16/2018 0428   BUN 12 02/08/2018 1422   CREATININE 0.59 (L) 05/16/2018 0428   CALCIUM 7.6 (L) 05/16/2018 0428   PROT 6.5 05/09/2018 1332   PROT 7.5 08/26/2017 1200   ALBUMIN 2.2 (L) 05/09/2018 1332   ALBUMIN 4.1 08/26/2017 1200   AST 30 05/09/2018 1332   ALT 29 05/09/2018 1332   ALKPHOS 127 (H) 05/09/2018 1332   BILITOT 0.8 05/09/2018 1332   BILITOT 0.9 08/26/2017 1200   GFRNONAA >60 05/16/2018 0428   GFRAA >60 05/16/2018 0428   Lipase     Component Value  Date/Time   LIPASE 26 11/01/2017 1410       Studies/Results: No results found.  Anti-infectives: Anti-infectives (From admission, onward)   Start     Dose/Rate Route Frequency Ordered Stop   05/10/18 0600  vancomycin (VANCOCIN) 1,250 mg in sodium chloride 0.9 % 250 mL IVPB  Status:  Discontinued     1,250 mg 166.7 mL/hr over 90 Minutes Intravenous Every 12 hours 05/09/18 1724 05/11/18 1552   05/09/18 2200  piperacillin-tazobactam (ZOSYN) IVPB 3.375 g  Status:  Discontinued     3.375 g 100 mL/hr over 30 Minutes Intravenous  Once 05/09/18 2156 05/09/18 2202   05/09/18 2200  clindamycin (CLEOCIN) IVPB 600 mg  Status:  Discontinued     600 mg 100 mL/hr over 30 Minutes Intravenous Every 8 hours 05/09/18 2156 05/11/18 1552   05/09/18 2200  piperacillin-tazobactam (ZOSYN) IVPB 3.375 g     3.375 g 12.5 mL/hr over 240 Minutes Intravenous Every 8 hours 05/09/18 1717     05/09/18 1600  clindamycin (CLEOCIN) IVPB 600 mg     600 mg 100 mL/hr over 30 Minutes Intravenous  Once 05/09/18 1551 05/09/18 1657   05/09/18 1430  piperacillin-tazobactam (ZOSYN) IVPB 3.375 g     3.375 g 100 mL/hr over 30 Minutes Intravenous  Once 05/09/18 1420 05/09/18 1542   05/09/18 1430  vancomycin (VANCOCIN) IVPB 1000 mg/200 mL premix  Status:  Discontinued     1,000 mg 200 mL/hr over 60 Minutes Intravenous  Once 05/09/18 1420 05/09/18 1426   05/09/18 1430  vancomycin (VANCOCIN) 2,000 mg in sodium chloride 0.9 % 500 mL IVPB     2,000 mg 250 mL/hr over 120 Minutes Intravenous STAT 05/09/18 1426 05/09/18 1750       Assessment/Plan History of adrenal insufficiency, with hypotension shock-hospitalized 6/3-6/09/2018 Left basal ganglia and posterior left centrum ovale stroke 04/10/2018- On Plavix Interstitial lung disease secondary to rheumatoid arthritis and mold exposure Chronic respiratory failure previously on home O2 History of COPD with tobacco use-discontinued smoking during his last  hospitalization. History of CABG 1993 Dr. 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 Gerrit Friends - POD#7 - WBC9.3, afebrile  - cxs with multiple organisms present, primary is citrobacter braakii - abx per ID - continue dressing changes BID with saline moistened kerlix - will discuss with MD whether patient might benefit from Dakin's for 72h  FEN:reg diet VTE: SCDs,lovenox ID: vanc/clindamycin 7/8 >7/10; IV zosyn 7/8>>    LOS: 7 days    Wells Guiles , [facility] Surgery 05/16/2018, 11:08 AM Pager: [phone] Consults: [phone] Mon-Fri 7:00 am-4:30 pm Sat-Sun 7:00 am-11:30 am

## 2018-05-16 NOTE — Progress Notes (Signed)
Occupational Therapy Treatment Patient Details Name: [name] MRN: [id number] DOB: 03-Aug-1956 Today's Date: 05/16/2018    History of present illness 62 y.o.male with PMHx CVA, AAA ,CAD, Chronic combined systolic and diastolic CHF(April 2018 EF 35 to 40%), MI,  ILD, Lung nodule, and on chronic prednisone, Tobacco abuse, MI, NSVT, Normocytic anemia,Pancreatitis, RA (rheumatoid arthritis).  Pt was admitted from SNF complaining of right sided buttock pain.  He has had a wound which was receiving dressing changes at his nursing facility.  CT scan which showed evidence of necrotizing skin infection and general surgery performed I&D of right buttock wound on 05/09/18   OT comments  Pt motivated to work with therapy this session. Focus of session on UB strengthening and endurance. Pt engaging in UB exercises using level 1 theraband across multiple planes with mod cues for technique and with rest breaks throughout. Pt fatigued with activity but is motivated to return to PLOF. POC remains appropriate at this time. Will continue to follow acutely to progress pt towards established OT goals.   Follow Up Recommendations  SNF;Supervision/Assistance - 24 hour    Equipment Recommendations  Other (comment)(TBD in next venue )          Precautions / Restrictions Precautions Precautions: Fall Precaution Comments: R buttocks wound Restrictions Weight Bearing Restrictions: No                                                     ADL either performed or assessed with clinical judgement   ADL Overall ADL's : Needs assistance/impaired                                       General ADL Comments: focus of session on UB/UE strengthening with use of Level 1 theraband. pt easily fatigued but motivated to work with therapy                        Cognition Arousal/Alertness: Awake/alert Behavior During Therapy: WFL for tasks assessed/performed;Flat  affect Overall Cognitive Status: Within Functional Limits for tasks assessed                                          Exercises General Exercises - Upper Extremity Shoulder Flexion: AROM;20 reps;Both;Supine;Theraband Theraband Level (Shoulder Flexion): Level 1 (Yellow) Shoulder Extension: AROM;Both;Supine;20 reps;Theraband Theraband Level (Shoulder Extension): Level 1 (Yellow) Shoulder Horizontal ABduction: AROM;20 reps;Both;Supine;Theraband Theraband Level (Shoulder Horizontal Abduction): Level 1 (Yellow) Shoulder Horizontal ADduction: AROM;20 reps;Both;Supine;Theraband Theraband Level (Shoulder Horizontal Adduction): Level 1 (Yellow) Elbow Flexion: AROM;20 reps;Both;Supine;Theraband Theraband Level (Elbow Flexion): Level 1 (Yellow) Elbow Extension: AROM;20 reps;Supine;Both;Theraband Theraband Level (Elbow Extension): Level 1 (Yellow)   Shoulder Instructions       General Comments      Pertinent Vitals/ Pain       Pain Assessment: 0-10 Pain Score: 9  Faces Pain Scale: Hurts whole lot Pain Location: R buttock Pain Descriptors / Indicators: Sore Pain Intervention(s): Monitored during session  Home Living  Prior Functioning/Environment              Frequency  Min 2X/week        Progress Toward Goals  OT Goals(current goals can now be found in the care plan section)  Progress towards OT goals: Progressing toward goals  Acute Rehab OT Goals Patient Stated Goal: walk again OT Goal Formulation: With patient Time For Goal Achievement: 05/27/18 Potential to Achieve Goals: Good  Plan Discharge plan remains appropriate    Co-evaluation                 AM-PAC PT "6 Clicks" Daily Activity     Outcome Measure   Help from another person eating meals?: A Little Help from another person taking care of personal grooming?: A Little Help from another person toileting, which includes  using toliet, bedpan, or urinal?: Total Help from another person bathing (including washing, rinsing, drying)?: A Lot Help from another person to put on and taking off regular upper body clothing?: A Lot Help from another person to put on and taking off regular lower body clothing?: Total 6 Click Score: 12    End of Session    OT Visit Diagnosis: Other abnormalities of gait and mobility (R26.89);Muscle weakness (generalized) (M62.81);Pain Pain - Right/Left: Right Pain - part of body: (buttock)   Activity Tolerance Patient tolerated treatment well   Patient Left in bed;with call bell/phone within reach   Nurse Communication Mobility status        Time: 0086-7619 OT Time Calculation (min): 16 min  Charges: OT General Charges $OT Visit: 1 Visit OT Treatments $Therapeutic Activity: 8-22 mins  Marcy Siren, OT Pager [phone] 05/16/2018    [name] 05/16/2018, 3:59 PM

## 2018-05-16 NOTE — Progress Notes (Signed)
IVT at bedside to place PICC line.

## 2018-05-16 NOTE — Progress Notes (Addendum)
INFECTIOUS DISEASE PROGRESS NOTE  ID: [name] is a 62 y.o. male with  Principal Problem:   Abscess of buttock Active Problems:   CAD (coronary artery disease)   ILD (interstitial lung disease) ([place])   Anemia   HTN (hypertension)   S/P CABG (coronary artery bypass graft)   Chronic combined systolic and diastolic CHF (congestive heart failure) (HCC)   Hx of rheumatoid arthritis   Rheumatoid arthritis (HCC)   Chronic obstructive pulmonary disease (HCC)   Supplemental oxygen dependent   AAA (abdominal aortic aneurysm) without rupture (HCC)   History of CVA with residual deficit   Adrenal insufficiency ([place]): Relative   NSVT (nonsustained ventricular tachycardia) (HCC)  Subjective: C/o wound pain.   Abtx:  Anti-infectives (From admission, onward)   Start     Dose/Rate Route Frequency Ordered Stop   05/10/18 0600  vancomycin (VANCOCIN) 1,250 mg in sodium chloride 0.9 % 250 mL IVPB  Status:  Discontinued     1,250 mg 166.7 mL/hr over 90 Minutes Intravenous Every 12 hours 05/09/18 1724 05/11/18 1552   05/09/18 2200  piperacillin-tazobactam (ZOSYN) IVPB 3.375 g  Status:  Discontinued     3.375 g 100 mL/hr over 30 Minutes Intravenous  Once 05/09/18 2156 05/09/18 2202   05/09/18 2200  clindamycin (CLEOCIN) IVPB 600 mg  Status:  Discontinued     600 mg 100 mL/hr over 30 Minutes Intravenous Every 8 hours 05/09/18 2156 05/11/18 1552   05/09/18 2200  piperacillin-tazobactam (ZOSYN) IVPB 3.375 g     3.375 g 12.5 mL/hr over 240 Minutes Intravenous Every 8 hours 05/09/18 1717     05/09/18 1600  clindamycin (CLEOCIN) IVPB 600 mg     600 mg 100 mL/hr over 30 Minutes Intravenous  Once 05/09/18 1551 05/09/18 1657   05/09/18 1430  piperacillin-tazobactam (ZOSYN) IVPB 3.375 g     3.375 g 100 mL/hr over 30 Minutes Intravenous  Once 05/09/18 1420 05/09/18 1542   05/09/18 1430  vancomycin (VANCOCIN) IVPB 1000 mg/200 mL premix  Status:  Discontinued     1,000 mg 200 mL/hr over 60  Minutes Intravenous  Once 05/09/18 1420 05/09/18 1426   05/09/18 1430  vancomycin (VANCOCIN) 2,000 mg in sodium chloride 0.9 % 500 mL IVPB     2,000 mg 250 mL/hr over 120 Minutes Intravenous STAT 05/09/18 1426 05/09/18 1750      Medications:  Scheduled: . atorvastatin  40 mg Oral q1800  . carvedilol  6.25 mg Oral BID WC  . clopidogrel  75 mg Oral Daily  . enoxaparin (LOVENOX) injection  40 mg Subcutaneous Q24H  . feeding supplement (PRO-STAT SUGAR FREE 64)  30 mL Oral BID  . gabapentin  300 mg Oral Q8H  . HYDROcodone-acetaminophen  1 tablet Oral TID  . insulin aspart  0-9 Units Subcutaneous TID WC  . ipratropium-albuterol  3 mL Nebulization BID  . multivitamin with minerals  1 tablet Oral Daily  . nicotine  7 mg Transdermal Daily  . nutrition supplement (JUVEN)  1 packet Oral BID BM  . nystatin cream   Topical BID  . predniSONE  10 mg Oral Q breakfast  . saccharomyces boulardii  250 mg Oral BID    Objective: Vital signs in last 24 hours: Temp:  [97.8 F (36.6 C)-98.2 F (36.8 C)] 97.8 F (36.6 C) (07/15 1253) Pulse Rate:  [72-79] 77 (07/15 1253) Resp:  [18-20] 18 (07/15 0443) BP: (100-112)/(64-71) 112/71 (07/15 1253) SpO2:  [95 %-97 %] 97 % (07/15 1253)  General appearance: alert, cooperative and no distress Resp: clear to auscultation bilaterally Cardio: regular rate and rhythm GI: normal findings: bowel sounds normal and soft, non-tender  Lab Results Recent Labs    05/15/18 0413 05/16/18 0428  WBC 13.3* 9.3  HGB 10.0* 9.0*  HCT 32.4* 28.8*  NA 137 137  K 3.5 3.5  CL 102 106  CO2 29 24  BUN 18 17  CREATININE 0.65 0.59*   Liver Panel No results for input(s): PROT, ALBUMIN, AST, ALT, ALKPHOS, BILITOT, BILIDIR, IBILI in the last 72 hours. Sedimentation Rate No results for input(s): ESRSEDRATE in the last 72 hours. C-Reactive Protein No results for input(s): CRP in the last 72 hours.  Microbiology: Recent Results (from the past 240 hour(s))  Culture,  blood (Routine x 2)     Status: None   Collection Time: 05/09/18  1:32 PM  Result Value Ref Range Status   Specimen Description   Final    BLOOD RIGHT HAND Performed at [facility] [address], [place], [place] [address]    Special Requests   Final    BOTTLES DRAWN AEROBIC AND ANAEROBIC Blood Culture adequate volume Performed at [facility] [address], [place], [place] [address]    Culture   Final    NO GROWTH 5 DAYS Performed at [facility], [place] [address], [place], [place] [address]    Report Status 05/14/2018 FINAL  Final  Culture, blood (Routine x 2)     Status: None   Collection Time: 05/09/18  1:35 PM  Result Value Ref Range Status   Specimen Description   Final    BLOOD RIGHT FOREARM Performed at [facility] [address] [place], [place] [address]    Special Requests   Final    BOTTLES DRAWN AEROBIC AND ANAEROBIC Blood Culture adequate volume Performed at [facility] [address] [place], [place] [address]    Culture   Final    NO GROWTH 5 DAYS Performed at [facility], [place] [address] [place], [place] [address]    Report Status 05/14/2018 FINAL  Final  Urine culture     Status: Abnormal   Collection Time: 05/09/18  3:46 PM  Result Value Ref Range Status   Specimen Description   Final    URINE, RANDOM Performed at [facility] [address] [place], [place] [address]    Special Requests   Final    NONE Performed at [facility], [place] [address], [place], [place] [address]    Culture (A)  Final    <10,000 COLONIES/mL INSIGNIFICANT GROWTH Performed at [facility] [address], [place], [place] [address]    Report Status 05/10/2018 FINAL  Final  Aerobic/Anaerobic Culture (surgical/deep wound)     Status: None (Preliminary result)   Collection Time: 05/09/18  8:34 PM  Result Value Ref Range Status    Specimen Description   Final    ABSCESS Performed at [facility] [address] [place], [place] [address]    Special Requests   Final    PERIRECTAL Performed at [facility], [place] [address] [place], [place] [address]    Gram Stain   Final    ABUNDANT WBC PRESENT, PREDOMINANTLY PMN MODERATE GRAM POSITIVE COCCI FEW GRAM NEGATIVE RODS    Culture   Final    MODERATE CITROBACTER BRAAKII MODERATE GRAM POSITIVE COCCI MODERATE BACTEROIDES  THETAIOTAOMICRON BETA LACTAMASE POSITIVE FEW EIKENELLA CORRODENS Usually susceptible to penicillin and other beta lactam agents,quinolones,macrolides and tetracyclines. Performed at [facility], [place] [address] [place], [place] [address]    Report Status PENDING  Incomplete   Organism ID, Bacteria CITROBACTER BRAAKII  Final      Susceptibility   Citrobacter braakii - MIC*    CEFAZOLIN >=64 RESISTANT Resistant     CEFEPIME <=1 SENSITIVE Sensitive     CEFTAZIDIME <=1 SENSITIVE Sensitive     CEFTRIAXONE <=1 SENSITIVE Sensitive     CIPROFLOXACIN <=0.25 SENSITIVE Sensitive     GENTAMICIN <=1 SENSITIVE Sensitive     IMIPENEM 0.5 SENSITIVE Sensitive     TRIMETH/SULFA <=20 SENSITIVE Sensitive     PIP/TAZO <=4 SENSITIVE Sensitive     * MODERATE CITROBACTER BRAAKII  MRSA PCR Screening     Status: None   Collection Time: 05/10/18 12:32 AM  Result Value Ref Range Status   MRSA by PCR NEGATIVE NEGATIVE Final    Comment:        The GeneXpert MRSA Assay (FDA approved for NASAL specimens only), is one component of a comprehensive MRSA colonization surveillance program. It is not intended to diagnose MRSA infection nor to guide or monitor treatment for MRSA infections. Performed at [facility], [place] [address], [place], [place] [address]     Studies/Results: No results found.   Assessment/Plan: Necrotizing fascitis Decubitus ulcer  Abscess COPD ILD on plaquinil, steroids  chronically  Total days of antibiotics:7 zosyn  His Cx has multiple organisms.  Will change him to invanz Will aim for 6 weeks of therapy due to concerns about osteomyelitis of adjacent R ischial tuberosity Available as needed.   No Known Allergies  OPAT Orders Discharge antibiotics: Invanz 1g IVPB qday Duration: 35 days End Date: 06-19-18  [facility] Per Protocol: please  Labs weekly while on IV antibiotics: _x_ CBC with differential __ BMP _x_ CMP __ CRP __ ESR __ Vancomycin trough  _x_ Please pull PIC at completion of IV antibiotics __ Please leave PIC in place until doctor has seen patient or been notified  Fax weekly labs to [phone]  Clinic Follow Up Appt: PCP           Bobby Rumpf MD, FACP Infectious Diseases (pager) [phone] [email or web address] 05/16/2018, 2:01 PM  LOS: 7 days

## 2018-05-16 NOTE — Progress Notes (Signed)
Peripherally Inserted Central Catheter/Midline Placement  The IV Nurse has discussed with the patient and/or persons authorized to consent for the patient, the purpose of this procedure and the potential benefits and risks involved with this procedure.  The benefits include less needle sticks, lab draws from the catheter, and the patient may be discharged home with the catheter. Risks include, but not limited to, infection, bleeding, blood clot (thrombus formation), and puncture of an artery; nerve damage and irregular heartbeat and possibility to perform a PICC exchange if needed/ordered by physician.  Alternatives to this procedure were also discussed.  B[facility] Power PICC patient education guide, fact sheet on infection prevention and patient information card has been provided to patient /or left at bedside.    PICC/Midline Placement Documentation  PICC Single Lumen 05/16/18 PICC Right Brachial 39 cm 0 cm (Active)  Indication for Insertion or Continuance of Line Home intravenous therapies (PICC only) 05/16/2018  9:34 PM  Exposed Catheter (cm) 0 cm 05/16/2018  9:34 PM  Site Assessment Clean;Dry;Intact 05/16/2018  9:34 PM  Line Status Flushed;Saline locked;Blood return noted 05/16/2018  9:34 PM  Dressing Type Transparent 05/16/2018  9:34 PM  Dressing Status Clean;Dry;Intact;Antimicrobial disc in place 05/16/2018  9:34 PM  Dressing Intervention New dressing 05/16/2018  9:34 PM  Dressing Change Due 05/23/18 05/16/2018  9:34 PM       [name] 05/16/2018, 9:35 PM

## 2018-05-16 NOTE — Progress Notes (Signed)
Physical Therapy Treatment Patient Details Name: [name] MRN: [id number] DOB: May 05, 1956 Today's Date: 05/16/2018    History of Present Illness 62 y.o.male with PMHx CVA, AAA ,CAD, Chronic combined systolic and diastolic CHF(April 2018 EF 35 to 40%), MI,  ILD, Lung nodule, and on chronic prednisone, Tobacco abuse, MI, NSVT, Normocytic anemia,Pancreatitis, RA (rheumatoid arthritis).  Pt was admitted from SNF complaining of right sided buttock pain.  He has had a wound which was receiving dressing changes at his nursing facility.  CT scan which showed evidence of necrotizing skin infection and general surgery performed I&D of right buttock wound on 05/09/18    PT Comments    Pt agreeable to mobilize and assisted to sitting however reports increased R buttock pain.  Pt requested return to supine (felt unable to tolerate sitting for exercises).  Pt assisted with performing LE exercises in supine.  Continue to recommend SNF upon d/c.   Follow Up Recommendations  SNF     Equipment Recommendations  Wheelchair cushion (measurements PT)    Recommendations for Other Services       Precautions / Restrictions Precautions Precautions: Fall Precaution Comments: R buttocks wound    Mobility  Bed Mobility Overal bed mobility: Needs Assistance Bed Mobility: Rolling;Sidelying to Sit Rolling: Supervision Sidelying to sit: Mod assist   Sit to supine: Mod assist   General bed mobility comments: Assist for LEs and trunk. assist for LEs onto bed  Transfers                 General transfer comment: pt reports increased pain with sitting, even when offloaded R hip by leaning on L forearm, too weak to attempt standing without +2 assist  Ambulation/Gait                 Stairs             Wheelchair Mobility    Modified Rankin (Stroke Patients Only)       Balance                                            Cognition Arousal/Alertness:  Awake/alert Behavior During Therapy: WFL for tasks assessed/performed Overall Cognitive Status: Within Functional Limits for tasks assessed                                        Exercises General Exercises - Lower Extremity Ankle Circles/Pumps: AROM;10 reps;Both Quad Sets: AROM;10 reps;Both Short Arc Quad: AAROM;10 reps;Both Heel Slides: AAROM;10 reps;Both Hip ABduction/ADduction: AAROM;10 reps;Both    General Comments        Pertinent Vitals/Pain Pain Assessment: Faces Faces Pain Scale: Hurts whole lot Pain Location: R buttock Pain Descriptors / Indicators: Sore Pain Intervention(s): Limited activity within patient's tolerance;Repositioned;Monitored during session    Home Living                      Prior Function            PT Goals (current goals can now be found in the care plan section) Progress towards PT goals: Progressing toward goals    Frequency    Min 2X/week      PT Plan Current plan remains appropriate    Co-evaluation  AM-PAC PT "6 Clicks" Daily Activity  Outcome Measure  Difficulty turning over in bed (including adjusting bedclothes, sheets and blankets)?: A Lot Difficulty moving from lying on back to sitting on the side of the bed? : Unable Difficulty sitting down on and standing up from a chair with arms (e.g., wheelchair, bedside commode, etc,.)?: Unable Help needed moving to and from a bed to chair (including a wheelchair)?: Total Help needed walking in hospital room?: Total Help needed climbing 3-5 steps with a railing? : Total 6 Click Score: 7    End of Session   Activity Tolerance: Patient tolerated treatment well Patient left: in bed;with call bell/phone within reach   PT Visit Diagnosis: Muscle weakness (generalized) (M62.81);Other abnormalities of gait and mobility (R26.89)     Time: 3354-5625 PT Time Calculation (min) (ACUTE ONLY): 28 min  Charges:  $Therapeutic Exercise: 8-22  mins $Therapeutic Activity: 8-22 mins                    G Codes:       Zenovia Jarred, PT, DPT 05/16/2018 Pager: [phone]  Maida Sale E 05/16/2018, 3:22 PM

## 2018-05-17 LAB — AEROBIC/ANAEROBIC CULTURE W GRAM STAIN (SURGICAL/DEEP WOUND)

## 2018-05-17 LAB — MAGNESIUM: Magnesium: 2.1 mg/dL (ref 1.7–2.4)

## 2018-05-17 LAB — BASIC METABOLIC PANEL
Anion gap: 7 (ref 5–15)
BUN: 15 mg/dL (ref 8–23)
CO2: 26 mmol/L (ref 22–32)
Calcium: 8.1 mg/dL — ABNORMAL LOW (ref 8.9–10.3)
Chloride: 106 mmol/L (ref 98–111)
Creatinine, Ser: 0.51 mg/dL — ABNORMAL LOW (ref 0.61–1.24)
GFR calc Af Amer: >60 mL/min (ref >60)
GLUCOSE: 91 mg/dL (ref 70–99)
POTASSIUM: 4.1 mmol/L (ref 3.5–5.1)
Sodium: 139 mmol/L (ref 135–145)

## 2018-05-17 LAB — GLUCOSE, CAPILLARY
GLUCOSE-CAPILLARY: 130 mg/dL — AB (ref 70–99)
Glucose-Capillary: 88 mg/dL (ref 70–99)
Glucose-Capillary: 118 mg/dL — ABNORMAL HIGH (ref 70–99)
Glucose-Capillary: 104 mg/dL — ABNORMAL HIGH (ref 70–99)

## 2018-05-17 LAB — AEROBIC/ANAEROBIC CULTURE (SURGICAL/DEEP WOUND)

## 2018-05-17 MED ORDER — MORPHINE SULFATE 15 MG PO TABS
30.0000 mg | ORAL_TABLET | ORAL | Status: DC | PRN
  Administered 2018-05-18: 30 mg via ORAL
  Filled 2018-05-17: qty 2

## 2018-05-17 MED ORDER — TRAMADOL HCL 50 MG PO TABS
100.0000 mg | ORAL_TABLET | Freq: Four times a day (QID) | ORAL | Status: DC | PRN
  Administered 2018-05-17 – 2018-05-18 (×2): 100 mg via ORAL
  Filled 2018-05-17 (×2): qty 2

## 2018-05-17 NOTE — Progress Notes (Signed)
PROGRESS NOTE    [name]  LKG:[id number] DOB: February 10, 1956 DOA: 05/09/2018 PCP: [name], PA-C    Brief Narrative:  62 year old male well-known to the [place] pulmonary critical care medicine service because of his known COPD interstitial lung disease and connective tissue disease with chronic immunosuppression who returns to our facility complaining of right sided buttock pain.  He has had a wound which is being received dressing at his nursing facility for the last 2 weeks.  He says it has been bleeding intermittently and they have been giving him wound dressing changes there.  Today it bled more so they sent him here for further evaluation.  He had a CT scan which showed evidence of necrotizing skin infection so pulmonary and critical care medicine was asked to admit and surgery was consulted.  Patient seen by general surgery and subsequently underwent I and D on 05/19/2018 with cultures pending.  Patient placed empirically on IV vancomycin, IV Zosyn, IV clindamycin.  Patient subsequently transferred from the ICU and placed on telemetry.      Assessment & Plan:   Principal Problem:   Abscess of buttock Active Problems:   CAD (coronary artery disease)   ILD (interstitial lung disease) (HCC)   Anemia   HTN (hypertension)   S/P CABG (coronary artery bypass graft)   Chronic combined systolic and diastolic CHF (congestive heart failure) (HCC)   Hx of rheumatoid arthritis   Rheumatoid arthritis (HCC)   Chronic obstructive pulmonary disease (HCC)   Supplemental oxygen dependent   AAA (abdominal aortic aneurysm) without rupture (HCC)   History of CVA with residual deficit   Adrenal insufficiency (HCC): Relative   NSVT (nonsustained ventricular tachycardia) (HCC)  1 right buttocks abscess with possible osteomyelitis Status post I&D 05/09/2018 per Dr. Gerrit Friends.  Patient currently afebrile.  Normal white count.  Cultures were obtained with preliminary multiple organisms  including Eikenella corrodens, Citrobacter braakiii.  Continue dressing changes with saline moist Kerlix twice daily per general surgery recommendations.  IV vancomycin and IV clindamycin have been discontinued.  Patient was subsequently transitioned to IV Zosyn and now currently on IV Invanz per ID recommendations.  Per ID patient will need 6 weeks of therapy due to concerns about osteomyelitis of adjacent right ischial tuberosity.  PICC line has been placed.  Outpatient follow-up with ID and general surgery.  She does not cough a little here and there but not like she did  2.  Chronic respiratory failure on home O2/COPD/interstitial lung disease Stable.  Immunosuppression on hold.  Continue nicotine patch and scheduled nebulizers.  Outpatient follow-up with pulmonary.    3.  Hypertension Blood pressure stable off stress dose steroids.  Monitor.    4.  Rheumatoid arthritis Patient has been tapered off stress dose steroids and back to home dose prednisone.    5.  Relative adrenal insufficiency Patient was on stress dose steroids and had been subsequently transitioned back to home dose prednisone 10 mg daily.  Outpatient follow-up.    6.  History of recent left basal ganglia and posterior left centrum ovale CVA 04/10/2018. Continue Plavix for secondary stroke prophylaxis.     7.  AAA Outpatient follow-up.  8.  Chronic systolic and diastolic CHF EF 30 to 35% Euvolemic.  IV fluids saline lock.  Continue Coreg, Lipitor.    9.  Chronic anemia H&H stable.   10 MASD to left buttocks, perineum, groin Continue nystatin.  11.  Nonsustained V. tach Patient noted to have some runs of V.  tach  and patient was asymptomatic.  No further episodes of nonsustained V. tach.  Keep potassium greater than 4.  Keep magnesium greater than 2.  Beta-blocker dose increased.  Magnesium at 2.1.  Continue daily potassium supplementation and monitor potassium levels.     DVT prophylaxis: Lovenox Code Status:  Full Family Communication: Updated patient. No family at bedside. Disposition Plan: Skilled nursing facility when medically stable and when bed available hopefully tomorrow 05/18/2018.     Consultants:   General surgery: Dr. Gerrit Friends 05/09/2018  PCCM admission  Procedures:   CT pelvis 05/19/2018  Chest x-ray 05/09/2018  I&D 05/09/2018 per Dr. Gerrit Friends  PICC line 05/16/2018  Antimicrobials:   IV clindamycin 05/09/2018>>>>> 05/11/2018  IV Zosyn 05/09/2018  IV Vanc 05/09/2018>>>>> 05/11/2018   Subjective: Patient in bed.  Denies any chest pain or shortness of breath.  Adjustment in pain regimen has helped manage right hip pain.  Objective: Vitals:   05/16/18 2015 05/16/18 2135 05/16/18 2214 05/17/18 0505  BP:   106/71 112/65  Pulse:   79 76  Resp:   20 18  Temp:   98.2 F (36.8 C) 98 F (36.7 C)  TempSrc:   Oral Oral  SpO2:  96% 98% 95%  Weight: 88.8 kg (195 lb 12.3 oz)     Height:        Intake/Output Summary (Last 24 hours) at 05/17/2018 0955 Last data filed at 05/17/2018 0900 Gross per 24 hour  Intake 500 ml  Output 3500 ml  Net -3000 ml   Filed Weights   05/13/18 0500 05/14/18 0600 05/16/18 2015  Weight: 90 kg (198 lb 6.6 oz) 93.1 kg (205 lb 4 oz) 88.8 kg (195 lb 12.3 oz)    Examination:  General exam: NAD. Respiratory system: Fine bibasilar crackles.  No wheezing.  No rhonchi.    Cardiovascular system: RRR no murmurs rubs or gallops.  No lower extremity edema.   Gastrointestinal system: Abdomen is soft, nontender, nondistended, positive bowel sounds.  No hepatosplenomegaly.  Central nervous system: Alert and oriented. No focal neurological deficits. Extremities: Symmetric 5 x 5 power. Skin: R gluteal wound with packing. Psychiatry: Judgement and insight appear normal. Mood & affect appropriate.     Data Reviewed: I have personally reviewed following labs and imaging studies  CBC: Recent Labs  Lab 05/11/18 0412 05/12/18 0421 05/13/18 0433 05/15/18 0413  05/16/18 0428  WBC 7.0 6.2 8.0 13.3* 9.3  NEUTROABS  --   --  5.6 10.3*  --   HGB 8.5* 8.6* 8.9* 10.0* 9.0*  HCT 27.1* 27.9* 28.6* 32.4* 28.8*  MCV 88.6 89.4 90.5 90.0 90.3  PLT 200 252 278 282 222   Basic Metabolic Panel: Recent Labs  Lab 05/12/18 0421 05/13/18 0433 05/15/18 0413 05/16/18 0428 05/17/18 0534  NA 139 140 137 137 139  K 4.6 4.2 3.5 3.5 4.1  CL 108 108 102 106 106  CO2 26 28 29 24 26   GLUCOSE 166* 134* 97 134* 91  BUN 19 21 18 17 15   CREATININE 0.65 0.69 0.65 0.59* 0.51*  CALCIUM 8.2* 8.2* 7.9* 7.6* 8.1*  MG  --  2.2  --   --  2.1   GFR: Estimated Creatinine Clearance: 108.2 mL/min (A) (by C-G formula based on SCr of 0.51 mg/dL (L)). Liver Function Tests: No results for input(s): AST, ALT, ALKPHOS, BILITOT, PROT, ALBUMIN in the last 168 hours. No results for input(s): LIPASE, AMYLASE in the last 168 hours. No results for input(s): AMMONIA in the last  168 hours. Coagulation Profile: No results for input(s): INR, PROTIME in the last 168 hours. Cardiac Enzymes: No results for input(s): CKTOTAL, CKMB, CKMBINDEX, TROPONINI in the last 168 hours. BNP (last 3 results) No results for input(s): PROBNP in the last 8760 hours. HbA1C: No results for input(s): HGBA1C in the last 72 hours. CBG: Recent Labs  Lab 05/16/18 0737 05/16/18 1223 05/16/18 1647 05/16/18 2211 05/17/18 0744  GLUCAP 96 124* 148* 91 88   Lipid Profile: No results for input(s): CHOL, HDL, LDLCALC, TRIG, CHOLHDL, LDLDIRECT in the last 72 hours. Thyroid Function Tests: No results for input(s): TSH, T4TOTAL, FREET4, T3FREE, THYROIDAB in the last 72 hours. Anemia Panel: No results for input(s): VITAMINB12, FOLATE, FERRITIN, TIBC, IRON, RETICCTPCT in the last 72 hours. Sepsis Labs: Recent Labs  Lab 05/11/18 6378  LATICACIDVEN 1.7    Recent Results (from the past 240 hour(s))  Culture, blood (Routine x 2)     Status: None   Collection Time: 05/09/18  1:32 PM  Result Value Ref Range  Status   Specimen Description   Final    BLOOD RIGHT HAND Performed at [facility], [address] [place], [place] [address]    Special Requests   Final    BOTTLES DRAWN AEROBIC AND ANAEROBIC Blood Culture adequate volume Performed at [facility], [address], [place], [place] [address]    Culture   Final    NO GROWTH 5 DAYS Performed at [facility], [address], [place], [place] [address]    Report Status 05/14/2018 FINAL  Final  Culture, blood (Routine x 2)     Status: None   Collection Time: 05/09/18  1:35 PM  Result Value Ref Range Status   Specimen Description   Final    BLOOD RIGHT FOREARM Performed at [facility], [address], [place], [place] [address]    Special Requests   Final    BOTTLES DRAWN AEROBIC AND ANAEROBIC Blood Culture adequate volume Performed at [facility], [address], [place], [place] [address]    Culture   Final    NO GROWTH 5 DAYS Performed at [facility], [address], [place], [place] [address]    Report Status 05/14/2018 FINAL  Final  Urine culture     Status: Abnormal   Collection Time: 05/09/18  3:46 PM  Result Value Ref Range Status   Specimen Description   Final    URINE, RANDOM Performed at [facility], [address], [place], [place] [address]    Special Requests   Final    NONE Performed at [facility], [address] [place], [place] [address]    Culture (A)  Final    <10,000 COLONIES/mL INSIGNIFICANT GROWTH Performed at [facility], [address], [place], [place] [address]    Report Status 05/10/2018 FINAL  Final  Aerobic/Anaerobic Culture (surgical/deep wound)     Status: None (Preliminary result)   Collection Time: 05/09/18  8:34 PM  Result Value Ref Range Status   Specimen Description   Final    ABSCESS Performed at [facility], [address] [place], [place] [address]    Special Requests   Final    PERIRECTAL Performed at [facility], [address] [place], [place] [address]    Gram Stain   Final    ABUNDANT WBC PRESENT, PREDOMINANTLY PMN MODERATE GRAM POSITIVE COCCI  FEW GRAM NEGATIVE RODS    Culture   Final    MODERATE CITROBACTER BRAAKII MODERATE GRAM POSITIVE COCCI MODERATE BACTEROIDES THETAIOTAOMICRON BETA LACTAMASE POSITIVE FEW EIKENELLA CORRODENS Usually susceptible to penicillin and other beta lactam agents,quinolones,macrolides and tetracyclines. Performed at [facility], [address], [place], [place] [address]    Report Status PENDING  Incomplete   Organism ID, Bacteria CITROBACTER BRAAKII  Final      Susceptibility   Citrobacter braakii - MIC*    CEFAZOLIN >=64 RESISTANT Resistant     CEFEPIME <=1 SENSITIVE Sensitive     CEFTAZIDIME <=1 SENSITIVE Sensitive     CEFTRIAXONE <=1 SENSITIVE Sensitive     CIPROFLOXACIN <=0.25 SENSITIVE Sensitive     GENTAMICIN <=1 SENSITIVE Sensitive     IMIPENEM 0.5 SENSITIVE Sensitive     TRIMETH/SULFA <=20 SENSITIVE Sensitive     PIP/TAZO <=4 SENSITIVE Sensitive     * MODERATE CITROBACTER BRAAKII  MRSA PCR Screening     Status: None   Collection Time: 05/10/18 12:32 AM  Result Value Ref Range Status   MRSA by PCR NEGATIVE NEGATIVE Final    Comment:        The GeneXpert MRSA Assay (FDA approved for NASAL specimens only), is one component of a comprehensive MRSA colonization surveillance program. It is not intended to diagnose MRSA infection nor to guide or monitor treatment for MRSA infections. Performed at [facility], [address], [place], [place] [address]          Radiology Studies: [place] Ekg Site Rite  Result Date: 05/16/2018 If [facility] image not attached, placement could not be confirmed due to current cardiac rhythm.       Scheduled Meds: . atorvastatin  40 mg Oral q1800  . carvedilol   6.25 mg Oral BID WC  . clopidogrel  75 mg Oral Daily  . enoxaparin (LOVENOX) injection  40 mg Subcutaneous Q24H  . feeding supplement (PRO-STAT SUGAR FREE 64)  30 mL Oral BID  . gabapentin  300 mg Oral Q8H  . HYDROcodone-acetaminophen  1 tablet Oral TID  . insulin aspart  0-9 Units Subcutaneous TID WC  . ipratropium-albuterol  3 mL Nebulization BID  . multivitamin with minerals  1 tablet Oral Daily  . nicotine  7 mg Transdermal Daily  . nutrition supplement (JUVEN)  1 packet Oral BID BM  . nystatin cream   Topical BID  . potassium chloride  40 mEq Oral Daily  . predniSONE  10 mg Oral Q breakfast  . saccharomyces boulardii  250 mg Oral BID  . sodium chloride flush  10-40 mL Intracatheter Q12H   Continuous Infusions: . ertapenem Stopped (05/16/18 2300)     LOS: 8 days    Time spent: 35 minutes    Ramiro Harvest, MD [facility] Pager [phone]  If 7PM-7AM, please contact night-coverage [email or web address] Password [email or web address] 05/17/2018, 9:55 AM

## 2018-05-17 NOTE — Progress Notes (Signed)
Nutrition Follow-up  DOCUMENTATION CODES:   Not applicable  INTERVENTION:    30 ml Prostat BID, each supplement provides 100 kcals and 15 grams protein.   Juven Fruit Punch BID, each serving provides 95kcal and 2.5g of protein (amino acids glutamine and arginine)  Provide MVI daily  NUTRITION DIAGNOSIS:   Increased nutrient needs related to wound healing as evidenced by estimated needs.  Ongoing  GOAL:   Patient will meet greater than or equal to 90% of their needs  Progressing  MONITOR:   PO intake, Supplement acceptance, Weight trends, Labs, Skin  REASON FOR ASSESSMENT:   Other (Comment)(Pressure Injury screening report)    ASSESSMENT:   62 year old male well-known to the [place] pulmonary critical care medicine service because of his known COPD interstitial lung disease and connective tissue disease with chronic immunosuppression who returns to our facility complaining of right sided buttock pain. He has had a wound which is being received dressing at his nursing facility for the last 2 weeks. He had a CT scan which showed evidence of necrotizing skin infection so pulmonary and critical care medicine was asked to admit and surgery was consulted.   7/8- I&D right buttocks   Pt reports appetite is off and on. Meal completions charted as 25-100% for his last eight meals. He ate 90% of his omelet this morning without complication. Meals look to contain pudding, soups, and sandwiches. RD encouraged intake of high protein food options. RN is mixing Prostat with Juven and pt tolerates this BID. Pt does not wish to have any other supplementation. Will continue with intervention. Pt awaiting SNF placement.   Weight noted to increase 3 lb since last RD visit on 7/9 (192 lb to 195 lb). Will continue to monitor trends.   Medications reviewed and include: MVI with minerals, 40 mEq KCl once/daily, prednisone Labs reviewed.   Diet Order:   Diet Order           Diet regular  Room service appropriate? Yes; Fluid consistency: Thin  Diet effective now          EDUCATION NEEDS:   No education needs have been identified at this time  Skin:  Skin Assessment: Skin Integrity Issues: Skin Integrity Issues:: Other (Comment) Other: R buttocks necrotizing fascitis   Last BM:  05/12/18  Height:   Ht Readings from Last 1 Encounters:  05/09/18 6\' 1"  (1.854 m)    Weight:   Wt Readings from Last 1 Encounters:  05/16/18 195 lb 12.3 oz (88.8 kg)    Ideal Body Weight:  83.64 kg  BMI:  Body mass index is 25.83 kg/m.  Estimated Nutritional Needs:   Kcal:  2180-2440 (25-28 kcal/kg)  Protein:  131-148 grams (1.5-1.7 grams/kg)  Fluid:  >/= 2.1 L/day    05/18/18 RD, LDN Clinical Nutrition Pager # - [phone]

## 2018-05-18 DIAGNOSIS — A419 Sepsis, unspecified organism: Secondary | ICD-10-CM

## 2018-05-18 DIAGNOSIS — Z8739 Personal history of other diseases of the musculoskeletal system and connective tissue: Secondary | ICD-10-CM

## 2018-05-18 DIAGNOSIS — R6520 Severe sepsis without septic shock: Secondary | ICD-10-CM

## 2018-05-18 DIAGNOSIS — I472 Ventricular tachycardia: Secondary | ICD-10-CM

## 2018-05-18 DIAGNOSIS — I25810 Atherosclerosis of coronary artery bypass graft(s) without angina pectoris: Secondary | ICD-10-CM

## 2018-05-18 LAB — CBC
HCT: 29.7 % — ABNORMAL LOW (ref 39.0–52.0)
HEMOGLOBIN: 9.1 g/dL — AB (ref 13.0–17.0)
MCH: 27.4 pg (ref 26.0–34.0)
MCHC: 30.6 g/dL (ref 30.0–36.0)
MCV: 89.5 fL (ref 78.0–100.0)
PLATELETS: 216 10*3/uL (ref 150–400)
RBC: 3.32 MIL/uL — ABNORMAL LOW (ref 4.22–5.81)
RDW: 16.6 % — AB (ref 11.5–15.5)
WBC: 6.7 10*3/uL (ref 4.0–10.5)

## 2018-05-18 LAB — BASIC METABOLIC PANEL
ANION GAP: 6 (ref 5–15)
BUN: 18 mg/dL (ref 8–23)
CALCIUM: 8.2 mg/dL — AB (ref 8.9–10.3)
CO2: 27 mmol/L (ref 22–32)
CREATININE: 0.4 mg/dL — AB (ref 0.61–1.24)
Chloride: 106 mmol/L (ref 98–111)
GFR calc Af Amer: >60 mL/min (ref >60)
GLUCOSE: 92 mg/dL (ref 70–99)
Potassium: 3.9 mmol/L (ref 3.5–5.1)
Sodium: 139 mmol/L (ref 135–145)

## 2018-05-18 LAB — GLUCOSE, CAPILLARY
Glucose-Capillary: 94 mg/dL (ref 70–99)
Glucose-Capillary: 97 mg/dL (ref 70–99)

## 2018-05-18 LAB — MAGNESIUM: MAGNESIUM: 2 mg/dL (ref 1.7–2.4)

## 2018-05-18 MED ORDER — FLUCONAZOLE IN SODIUM CHLORIDE 200-0.9 MG/100ML-% IV SOLN
200.0000 mg | Freq: Once | INTRAVENOUS | Status: AC
  Administered 2018-05-18: 200 mg via INTRAVENOUS
  Filled 2018-05-18: qty 100

## 2018-05-18 MED ORDER — MORPHINE SULFATE 30 MG PO TABS: 30.0000 mg | ORAL_TABLET | ORAL | 0 refills | Status: DC | PRN

## 2018-05-18 MED ORDER — HYDROXYCHLOROQUINE SULFATE 200 MG PO TABS: 200.0000 mg | ORAL_TABLET | Freq: Two times a day (BID) | ORAL | 0 refills | Status: AC

## 2018-05-18 MED ORDER — JUVEN PO PACK: 1.0000 | PACK | Freq: Two times a day (BID) | ORAL | 0 refills | Status: AC

## 2018-05-18 MED ORDER — HYDROCODONE-ACETAMINOPHEN 10-325 MG PO TABS: 1.0000 | ORAL_TABLET | Freq: Three times a day (TID) | ORAL | 0 refills | Status: DC

## 2018-05-18 MED ORDER — ERTAPENEM IV (FOR PTA / DISCHARGE USE ONLY): 1.0000 g | INTRAVENOUS | 0 refills | Status: DC

## 2018-05-18 MED ORDER — SACCHAROMYCES BOULARDII 250 MG PO CAPS: 250.0000 mg | ORAL_CAPSULE | Freq: Two times a day (BID) | ORAL | Status: AC

## 2018-05-18 MED ORDER — PRO-STAT SUGAR FREE PO LIQD: 30.0000 mL | Freq: Two times a day (BID) | ORAL | 0 refills | Status: DC

## 2018-05-18 MED ORDER — CARVEDILOL 6.25 MG PO TABS: 6.2500 mg | ORAL_TABLET | Freq: Two times a day (BID) | ORAL | 0 refills | Status: DC

## 2018-05-18 MED ORDER — HEPARIN SOD (PORK) LOCK FLUSH 100 UNIT/ML IV SOLN
250.0000 [IU] | INTRAVENOUS | Status: AC | PRN
  Administered 2018-05-18: 250 [IU]

## 2018-05-18 MED ORDER — ENOXAPARIN SODIUM 40 MG/0.4ML ~~LOC~~ SOLN: 40.0000 mg | SUBCUTANEOUS | Status: DC

## 2018-05-18 MED ORDER — CLOPIDOGREL BISULFATE 75 MG PO TABS: 75.0000 mg | ORAL_TABLET | Freq: Every day | ORAL | 0 refills | Status: AC

## 2018-05-18 MED ORDER — TRAMADOL HCL 50 MG PO TABS: 100.0000 mg | ORAL_TABLET | Freq: Four times a day (QID) | ORAL | 0 refills | Status: DC | PRN

## 2018-05-18 MED ORDER — FLUCONAZOLE 100 MG PO TABS: 100.0000 mg | ORAL_TABLET | Freq: Every day | ORAL | 0 refills | Status: AC

## 2018-05-18 MED ORDER — NYSTATIN 100000 UNIT/GM EX CREA: TOPICAL_CREAM | Freq: Two times a day (BID) | CUTANEOUS | 0 refills | Status: AC

## 2018-05-18 MED ORDER — FLUCONAZOLE 100 MG PO TABS
100.0000 mg | ORAL_TABLET | Freq: Every day | ORAL | Status: DC
  Filled 2018-05-18: qty 1

## 2018-05-18 MED ORDER — IPRATROPIUM-ALBUTEROL 0.5-2.5 (3) MG/3ML IN SOLN: 3.0000 mL | Freq: Two times a day (BID) | RESPIRATORY_TRACT | Status: DC

## 2018-05-18 MED ORDER — POTASSIUM CHLORIDE CRYS ER 20 MEQ PO TBCR: 40.0000 meq | EXTENDED_RELEASE_TABLET | Freq: Every day | ORAL | Status: AC

## 2018-05-18 MED ORDER — ADULT MULTIVITAMIN W/MINERALS CH: 1.0000 | ORAL_TABLET | Freq: Every day | ORAL | Status: DC

## 2018-05-18 NOTE — Progress Notes (Addendum)
Called report to [facility] [employer] [phone]. Sister at bedside. Reviewed discharge plans with pt and sister. Acknowledged understanding. IVT called, pt will transport with PICC in place. Dressing changed earlier in the shift, dressing remain intact. IV abts adminstered as ordered from the MD. SRP, RN

## 2018-05-18 NOTE — Progress Notes (Signed)
Physical Therapy Treatment Patient Details Name: [name] MRN: [id number] DOB: 1956/10/10 Today's Date: 05/18/2018    History of Present Illness 62 y.o.male with PMHx CVA, AAA ,CAD, Chronic combined systolic and diastolic CHF(April 2018 EF 35 to 40%), MI,  ILD, Lung nodule, and on chronic prednisone, Tobacco abuse, MI, NSVT, Normocytic anemia,Pancreatitis, RA (rheumatoid arthritis).  Pt was admitted from SNF complaining of right sided buttock pain.  He has had a wound which was receiving dressing changes at his nursing facility.  CT scan which showed evidence of necrotizing skin infection and general surgery performed I&D of right buttock wound on 05/09/18    PT Comments    Pt in bed and spouse in room during session.  Assisted with bed mobility and sitting EOB however required + 2 MAX/Total Assist and was unable to stand despite + 2 attempts and + 2 assist.  "Bear Hug" to stand briefly to change bed pad.  Returned to Left side lying.  Positioned to comfort.    Follow Up Recommendations  SNF     Equipment Recommendations       Recommendations for Other Services       Precautions / Restrictions Precautions Precautions: Fall Precaution Comments: R buttocks wound Restrictions Weight Bearing Restrictions: No    Mobility  Bed Mobility Overal bed mobility: Needs Assistance Bed Mobility: Rolling;Sidelying to Sit Rolling: +2 for physical assistance;Mod assist Sidelying to sit: Total assist;+2 for physical assistance;+2 for safety/equipment Supine to sit: Max assist;Total assist;+2 for physical assistance;+2 for safety/equipment Sit to supine: Max assist;Total assist;+2 for physical assistance;+2 for safety/equipment   General bed mobility comments: Assist for LEs and trunk. assist for LEs onto bed   limited sitting tolerance EOB  Transfers                 General transfer comment: attempted sit to stand however unable to clear hips off bed.  "Bear Hug" to briefly  stand to change bed pads MAX Assist pt < 25%   Ambulation/Gait             General Gait Details: unable to attempt due to poor transfer/standing ability   Stairs             Wheelchair Mobility    Modified Rankin (Stroke Patients Only)       Balance                                            Cognition Arousal/Alertness: Awake/alert Behavior During Therapy: WFL for tasks assessed/performed;Flat affect Overall Cognitive Status: Within Functional Limits for tasks assessed                                        Exercises      General Comments        Pertinent Vitals/Pain Pain Assessment: Faces Faces Pain Scale: Hurts whole lot Pain Location: R buttock Pain Descriptors / Indicators: Sore;Grimacing Pain Intervention(s): Monitored during session;Repositioned    Home Living                      Prior Function            PT Goals (current goals can now be found in the care plan section) Progress towards PT goals: Progressing toward  goals    Frequency    Min 2X/week      PT Plan      Co-evaluation              AM-PAC PT "6 Clicks" Daily Activity  Outcome Measure  Difficulty turning over in bed (including adjusting bedclothes, sheets and blankets)?: Unable Difficulty moving from lying on back to sitting on the side of the bed? : Unable Difficulty sitting down on and standing up from a chair with arms (e.g., wheelchair, bedside commode, etc,.)?: Unable Help needed moving to and from a bed to chair (including a wheelchair)?: Total Help needed walking in hospital room?: Total Help needed climbing 3-5 steps with a railing? : Total 6 Click Score: 6    End of Session Equipment Utilized During Treatment: Gait belt Activity Tolerance: Patient limited by fatigue;Patient limited by pain(pre medicated) Patient left: in bed;with call bell/phone within reach   PT Visit Diagnosis: Muscle weakness  (generalized) (M62.81);Other abnormalities of gait and mobility (R26.89)     Time: 2458-0998 PT Time Calculation (min) (ACUTE ONLY): 34 min  Charges:  $Therapeutic Activity: 23-37 mins                    G Codes:       Felecia Shelling  PTA [facility]  Acute  Rehab Pager      [phone]

## 2018-05-18 NOTE — Progress Notes (Signed)
[facility] Progress Note  9 Days Post-Op  Subjective: CC-  No new complaints. Ok at rest, continues to have pain with dressing changes.  Objective: Vital signs in last 24 hours: Temp:  [97.6 F (36.4 C)-98 F (36.7 C)] 98 F (36.7 C) (07/17 0503) Pulse Rate:  [74-80] 74 (07/17 0503) Resp:  [16-18] 16 (07/17 0503) BP: (111-129)/(76-79) 111/79 (07/17 0503) SpO2:  [92 %-99 %] 92 % (07/17 0503) Last BM Date: 05/17/18  Intake/Output from previous day: 07/16 0701 - 07/17 0700 In: 900 [P.O.:900] Out: 1525 [Urine:1525] Intake/Output this shift: No intake/output data recorded.  PE: Gen:  Alert, NAD, pleasant Pulm:  effort normal GU: right gluteal wound seen below, exposed muscle/tendon, no purulent drainage. Surrounding candida infection     Lab Results:  Recent Labs    05/16/18 0428 05/18/18 0450  WBC 9.3 6.7  HGB 9.0* 9.1*  HCT 28.8* 29.7*  PLT 222 216   BMET Recent Labs    05/17/18 0534 05/18/18 0450  NA 139 139  K 4.1 3.9  CL 106 106  CO2 26 27  GLUCOSE 91 92  BUN 15 18  CREATININE 0.51* 0.40*  CALCIUM 8.1* 8.2*   PT/INR No results for input(s): LABPROT, INR in the last 72 hours. CMP     Component Value Date/Time   NA 139 05/18/2018 0450   NA 142 02/08/2018 1422   K 3.9 05/18/2018 0450   CL 106 05/18/2018 0450   CO2 27 05/18/2018 0450   GLUCOSE 92 05/18/2018 0450   BUN 18 05/18/2018 0450   BUN 12 02/08/2018 1422   CREATININE 0.40 (L) 05/18/2018 0450   CALCIUM 8.2 (L) 05/18/2018 0450   PROT 6.5 05/09/2018 1332   PROT 7.5 08/26/2017 1200   ALBUMIN 2.2 (L) 05/09/2018 1332   ALBUMIN 4.1 08/26/2017 1200   AST 30 05/09/2018 1332   ALT 29 05/09/2018 1332   ALKPHOS 127 (H) 05/09/2018 1332   BILITOT 0.8 05/09/2018 1332   BILITOT 0.9 08/26/2017 1200   GFRNONAA >60 05/18/2018 0450   GFRAA >60 05/18/2018 0450   Lipase     Component Value Date/Time   LIPASE 26 11/01/2017 1410       Studies/Results: [place] Ekg Site  Rite  Result Date: 05/16/2018 If Site Rite image not attached, placement could not be confirmed due to current cardiac rhythm.   Anti-infectives: Anti-infectives (From admission, onward)   Start     Dose/Rate Route Frequency Ordered Stop   05/16/18 2200  ertapenem (INVANZ) 1,000 mg in sodium chloride 0.9 % 100 mL IVPB     1 g 200 mL/hr over 30 Minutes Intravenous Every 24 hours 05/16/18 1416 06/20/18 2159   05/10/18 0600  vancomycin (VANCOCIN) 1,250 mg in sodium chloride 0.9 % 250 mL IVPB  Status:  Discontinued     1,250 mg 166.7 mL/hr over 90 Minutes Intravenous Every 12 hours 05/09/18 1724 05/11/18 1552   05/09/18 2200  piperacillin-tazobactam (ZOSYN) IVPB 3.375 g  Status:  Discontinued     3.375 g 100 mL/hr over 30 Minutes Intravenous  Once 05/09/18 2156 05/09/18 2202   05/09/18 2200  clindamycin (CLEOCIN) IVPB 600 mg  Status:  Discontinued     600 mg 100 mL/hr over 30 Minutes Intravenous Every 8 hours 05/09/18 2156 05/11/18 1552   05/09/18 2200  piperacillin-tazobactam (ZOSYN) IVPB 3.375 g  Status:  Discontinued     3.375 g 12.5 mL/hr over 240 Minutes Intravenous Every 8 hours 05/09/18 1717 05/16/18 1416   05/09/18  1600  clindamycin (CLEOCIN) IVPB 600 mg     600 mg 100 mL/hr over 30 Minutes Intravenous  Once 05/09/18 1551 05/09/18 1657   05/09/18 1430  piperacillin-tazobactam (ZOSYN) IVPB 3.375 g     3.375 g 100 mL/hr over 30 Minutes Intravenous  Once 05/09/18 1420 05/09/18 1542   05/09/18 1430  vancomycin (VANCOCIN) IVPB 1000 mg/200 mL premix  Status:  Discontinued     1,000 mg 200 mL/hr over 60 Minutes Intravenous  Once 05/09/18 1420 05/09/18 1426   05/09/18 1430  vancomycin (VANCOCIN) 2,000 mg in sodium chloride 0.9 % 500 mL IVPB     2,000 mg 250 mL/hr over 120 Minutes Intravenous STAT 05/09/18 1426 05/09/18 1750       Assessment/Plan History of adrenal insufficiency, with hypotension shock-hospitalized 6/3-6/09/2018 Left basal ganglia and posterior left centrum  ovale stroke 04/10/2018- On Plavix Interstitial lung disease secondary to rheumatoid arthritis and mold exposure Chronic respiratory failure previously on home O2 History of COPD with tobacco use-discontinued smoking during his last hospitalization. History of CABG 1993 Dr. 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 Gerrit Friends - POD#9 - WBC6.7, afebrile  - cxswith multiple organisms present, primary is citrobacter braakii - abx per ID - Continue BID wet to dry dressing changes. patient needs to be turned every 2 hours. Will ask WOC RN to see for wound care recommendations and surrounding candida infection. Abx per ID.  FEN:reg diet VTE: SCDs,lovenox ID: vanc/clindamycin 7/8 >7/10; IV zosyn 7/8>>7/8, invanz 7/15>>   LOS: 9 days    Franne Forts , [facility] Surgery 05/18/2018, 10:08 AM Pager: [phone] Consults: [phone] Mon 7:00 am -11:30 AM Tues-Fri 7:00 am-4:30 pm Sat-Sun 7:00 am-11:30 am

## 2018-05-18 NOTE — Progress Notes (Signed)
Pt discharged to [facility]. Vss pain med administered prior to leaving Vicodin 10-325mg  as ordered. Sister at bedside with pt. Pt alert and orient talking as he was leaving. SRP, RN

## 2018-05-18 NOTE — Discharge Summary (Addendum)
Physician Discharge Summary  [name] OMV:[id number] DOB: 19-Sep-1956 DOA: 05/09/2018  PCP: [name], PA-C  Admit date: 05/09/2018 Discharge date: 05/18/2018  Time spent: 65 minutes  Recommendations for Outpatient Follow-up:  1. Follow-up with Dr. Harlow Asa, general surgery 06/06/2018.  Follow-up with 2. Dr. Johnnye Sima, infectious diseases in 3 to 4 weeks 3. Follow-up with [name], PA-C in 3 to 4 weeks 4. Follow-up with MD at skilled nursing facility.  Patient will need a basic metabolic profile done in 1 week to follow-up on electrolytes and renal function.   Discharge Diagnoses:  Principal Problem:   Abscess of buttock Active Problems:   CAD (coronary artery disease)   ILD (interstitial lung disease) (HCC)   Anemia   HTN (hypertension)   S/P CABG (coronary artery bypass graft)   Chronic combined systolic and diastolic CHF (congestive heart failure) (HCC)   Hx of rheumatoid arthritis   Rheumatoid arthritis (HCC)   Chronic obstructive pulmonary disease (HCC)   Supplemental oxygen dependent   AAA (abdominal aortic aneurysm) without rupture (HCC)   History of CVA with residual deficit   Adrenal insufficiency ([place]): Relative   NSVT (nonsustained ventricular tachycardia) ([place])   Discharge Condition: Stable and improved  Diet recommendation: Heart healthy  Filed Weights   05/13/18 0500 05/14/18 0600 05/16/18 2015  Weight: 90 kg (198 lb 6.6 oz) 93.1 kg (205 lb 4 oz) 88.8 kg (195 lb 12.3 oz)    History of present illness:  Per Dr. [place] Bells This is a 62 year old male well-known to the [place] pulmonary critical care medicine service because of his known COPD interstitial lung disease and connective tissue disease with chronic immunosuppression who returned to our facility complaining of right sided buttock pain.  He has had a wound which is being received dressing at his nursing facility for the last 2 weeks.  He stated it had been bleeding intermittently and they  have been giving him wound dressing changes there.  Today it bled more so they sent him here for further evaluation.  He had a CT scan which showed evidence of necrotizing skin infection so pulmonary and critical care medicine was asked to admit and surgery was consulted.  The patient says that he denied any shortness of breath, nausea or vomiting.  He had a normal breakfast this morning.     Hospital Course:  1 right buttocks abscess with possible osteomyelitis Status post I&D 05/09/2018 per Dr. Harlow Asa.  Patient remained afebrile throughout most of the hospitalization.  Cultures were obtained with multiple organisms including Eikenella corrodens, Citrobacter braakiii.    Patient was initially placed empirically on vancomycin and IV clindamycin and subsequently transition to IV Zosyn while cultures were pending.  Patient was followed by general surgery and patient underwent dressing changes with saline moist Kerlix twice daily per general surgery recommendations.    ID was consulted and followed the patient during the hospitalization.  Was recommended per ID that IV Zosyn subsequently be transitioned to IV Invanz.   Per ID patient will need 6 weeks of therapy due to concerns about osteomyelitis of adjacent right ischial tuberosity.  PICC line was placed.  Outpatient follow-up with ID and general surgery.   2.  Chronic respiratory failure on home O2/COPD/interstitial lung disease Remained stable throughout the hospitalization.  Patient's immunosuppression was held and will be resumed after completion of antibiotic treatment.  Patient was maintained on a nicotine patch as well as scheduled nebulizers.  Outpatient follow-up with pulmonary.   3.  Hypertension  Blood pressure stable off stress dose steroids.    4.  Rheumatoid arthritis Patient has been tapered off stress dose steroids and back to home dose prednisone.    5.  Relative adrenal insufficiency Patient was on stress dose steroids initially  on presentation.  Stress dose steroids were subsequently tapered back to patient's home dose of prednisone 10 mg daily.  Outpatient follow-up.   6.  History of recent left basal ganglia and posterior left centrum ovale CVA 04/10/2018. Patient's Plavix was initially held during the hospitalization due to problem #1.  Plavix subsequently resumed with no evidence of bleeding.  Outpatient follow-up.     7.  AAA Outpatient follow-up.  8.  Chronic systolic and diastolic CHF EF 30 to 73% Patient remained euvolemic during the hospitalization.  Patient was initially placed on IV fluids which was subsequently discontinued.  Patient resumed on Lipitor and Coreg.  Coreg dose adjusted for nonsustained V. tach during the hospitalization which resolved.     9.  Chronic anemia Hemoglobin remained stable.   10 MASD to left buttocks, perineum, groin Patient placed on nystatin cream.  Patient given a dose of IV Diflucan x1 and will be placed on oral Diflucan for 6 more days on discharge.    11.  Nonsustained V. tach Patient noted to have some runs of V. tach  and patient was asymptomatic.  No further episodes of nonsustained V. tach.  Keep potassium greater than 4.  Keep magnesium greater than 2.  Beta-blocker dose increased.  Magnesium at 2.1.  Continue daily potassium supplementation.  Outpatient follow-up.    #12 genitourinary candidiasis Eiad patient given a dose of IV fluconazole.  Nystatin cream was started.  Patient will also be started on Diflucan 100 mg daily x6 more days on discharge.      Procedures:  CT pelvis 05/19/2018  Chest x-ray 05/09/2018  I&D 05/09/2018 per Dr. Harlow Asa  PICC line 05/16/2018      Consultations:  General surgery: Dr. Harlow Asa 05/09/2018  PCCM admission      Discharge Exam: Vitals:   05/18/18 0503 05/18/18 1034  BP: 111/79   Pulse: 74 80  Resp: 16 17  Temp: 98 F (36.7 C)   SpO2: 92% 95%    General: NAD Cardiovascular: RRR Respiratory:  CTAB  Discharge Instructions   Discharge Instructions    Diet - low sodium heart healthy   Complete by:  As directed    Home infusion instructions [facility] May follow [place] Dosing Protocol; May administer Cathflo as needed to maintain patency of vascular access device.; Flushing of vascular access device: per [facility] Protocol: 0.9% NaCl pre/post medica...   Complete by:  As directed    Instructions:  May follow [place] Dosing Protocol   Instructions:  May administer Cathflo as needed to maintain patency of vascular access device.   Instructions:  Flushing of vascular access device: per [facility] Protocol: 0.9% NaCl pre/post medication administration and prn patency; Heparin 100 u/ml, 53m for implanted ports and Heparin 10u/ml, 531mfor all other central venous catheters.   Instructions:  May follow [facility] Anaphylaxis Protocol for First Dose Administration in the home: 0.9% NaCl at 25-50 ml/hr to maintain IV access for protocol meds. Epinephrine 0.3 ml IV/IM PRN and Benadryl 25-50 IV/IM PRN s/s of anaphylaxis.   Instructions:  [place] Coordinator (RN) to assist per patient IV care needs in the home PRN.   Increase activity slowly   Complete by:  As directed  Allergies as of 05/18/2018   No Known Allergies     Medication List    STOP taking these medications   aspirin 81 MG chewable tablet   collagenase ointment Commonly known as:  SANTYL     TAKE these medications   acetaminophen 500 MG tablet Commonly known as:  TYLENOL Take 1,000 mg by mouth every 6 (six) hours as needed for mild pain.   atorvastatin 40 MG tablet Commonly known as:  LIPITOR Take 1 tablet (40 mg total) by mouth daily at 6 PM.   carvedilol 6.25 MG tablet Commonly known as:  COREG Take 1 tablet (6.25 mg total) by mouth 2 (two) times daily with a meal. What changed:    medication strength  how much to take   clopidogrel 75 MG tablet Commonly known as:  PLAVIX Take 1  tablet (75 mg total) by mouth daily.   enoxaparin 40 MG/0.4ML injection Commonly known as:  LOVENOX Inject 0.4 mLs (40 mg total) into the skin daily. Start taking on:  05/19/2018   ertapenem IVPB Commonly known as:  INVANZ Inject 1 g into the vein daily for 34 doses. Indication:  Wound infection Last Day of Therapy:  06/19/2018 Labs - Once weekly:  CBC/D and BMP, Labs - Every other week:  ESR and CRP   feeding supplement (PRO-STAT SUGAR FREE 64) Liqd Take 30 mLs by mouth 2 (two) times daily.   fluconazole 100 MG tablet Commonly known as:  DIFLUCAN Take 1 tablet (100 mg total) by mouth daily for 6 days. Start taking on:  05/19/2018   gabapentin 300 MG capsule Commonly known as:  NEURONTIN Take 1 capsule (300 mg total) by mouth every 8 (eight) hours.   HYDROcodone-acetaminophen 10-325 MG tablet Commonly known as:  NORCO Take 1 tablet by mouth 3 (three) times daily. What changed:    when to take this  reasons to take this   hydroxychloroquine 200 MG tablet Commonly known as:  PLAQUENIL Take 1 tablet (200 mg total) by mouth 2 (two) times daily. Start taking on:  06/22/2018 What changed:  These instructions start on 06/22/2018. If you are unsure what to do until then, ask your doctor or other care provider.   ipratropium-albuterol 0.5-2.5 (3) MG/3ML Soln Commonly known as:  DUONEB Take 3 mLs by nebulization 2 (two) times daily.   MAALOX PO Take 30 mLs by mouth every 4 (four) hours as needed (indigestion). And every 48 hours   magnesium hydroxide 400 MG/5ML suspension Commonly known as:  MILK OF MAGNESIA Take 30 mLs by mouth daily as needed for mild constipation. And QHS   mirtazapine 7.5 MG tablet Commonly known as:  REMERON Take 7.5 mg by mouth at bedtime.   morphine 30 MG tablet Commonly known as:  MSIR Take 1 tablet (30 mg total) by mouth every 4 (four) hours as needed (breakthrough pain).   multivitamin with minerals Tabs tablet Take 1 tablet by mouth  daily. Start taking on:  05/19/2018   MUSCLE RUB EX Apply 1 application topically as needed (for shoulder or knee pain).   nicotine 7 mg/24hr patch Commonly known as:  NICODERM CQ - dosed in mg/24 hr Place 1 patch (7 mg total) onto the skin daily.   nutrition supplement (JUVEN) Pack Take 1 packet by mouth 2 (two) times daily between meals.   nystatin cream Commonly known as:  MYCOSTATIN Apply topically 2 (two) times daily.   OXYGEN Inhale 2 L into the lungs at bedtime.   polyethylene  glycol packet Commonly known as:  MIRALAX / GLYCOLAX Take 17 g by mouth 2 (two) times daily. Until having 1 soft BM daily, then can decrease to daily or as needed   potassium chloride SA 20 MEQ tablet Commonly known as:  K-DUR,KLOR-CON Take 2 tablets (40 mEq total) by mouth daily. Start taking on:  05/19/2018   predniSONE 10 MG tablet Commonly known as:  DELTASONE Take 10 mg daily   albuterol (2.5 MG/3ML) 0.083% nebulizer solution Commonly known as:  PROVENTIL Take 2.5 mg by nebulization every 6 (six) hours as needed for wheezing or shortness of breath. What changed:  Another medication with the same name was removed. Continue taking this medication, and follow the directions you see here.   PROVENTIL HFA 108 (90 Base) MCG/ACT inhaler Generic drug:  albuterol INHALE 2 PUFFS EVERY 6 HOURS AS NEEDED FOR WHEEZE/SHORTNESS OF BREATH What changed:  Another medication with the same name was removed. Continue taking this medication, and follow the directions you see here.   saccharomyces boulardii 250 MG capsule Commonly known as:  FLORASTOR Take 1 capsule (250 mg total) by mouth 2 (two) times daily.   traMADol 50 MG tablet Commonly known as:  ULTRAM Take 2 tablets (100 mg total) by mouth every 6 (six) hours as needed (mild pain if tylenol not effective).            Home Infusion Instuctions  (From admission, onward)        Start     Ordered   05/18/18 0000  Home infusion instructions  [facility] May follow [place] Dosing Protocol; May administer Cathflo as needed to maintain patency of vascular access device.; Flushing of vascular access device: per [facility] Protocol: 0.9% NaCl pre/post medica...    Question Answer Comment  Instructions May follow [place] Dosing Protocol   Instructions May administer Cathflo as needed to maintain patency of vascular access device.   Instructions Flushing of vascular access device: per [facility] Protocol: 0.9% NaCl pre/post medication administration and prn patency; Heparin 100 u/ml, 54m for implanted ports and Heparin 10u/ml, 530mfor all other central venous catheters.   Instructions May follow [facility] Anaphylaxis Protocol for First Dose Administration in the home: 0.9% NaCl at 25-50 ml/hr to maintain IV access for protocol meds. Epinephrine 0.3 ml IV/IM PRN and Benadryl 25-50 IV/IM PRN s/s of anaphylaxis.   Instructions [facility] Infusion Coordinator (RN) to assist per patient IV care needs in the home PRN.      05/18/18 1222     No Known Allergies Follow-up Information    GeArmandina GemmaMD. Go on 06/06/2018.   Specialty:  General Surgery Why:  Appointment scheduled for 11:00 AM. Please arrive by 10:30 AM to check in. Bring ID and insurance information.  Contact information: [phone]      MD AT SNF Follow up.        GoClent DemarkPA-C. Schedule an appointment as soon as possible for a visit in 3 week(s).   Specialty:  Physician Assistant Why:  F/U IN 3-4 WEEKS. Contact information: [phone]        NeDorothy SparkMD .   Specialty:  Cardiology Contact information: [address] [place] [phone]      HaCampbell RichesMD. Schedule an appointment as soon as possible for a visit in 3  week(s).   Specialty:  Infectious Diseases Why:  f/u in 3-4 weeks. Contact  information: [place] [address] [phone]            The results of significant diagnostics from this hospitalization (including imaging, microbiology, ancillary and laboratory) are listed below for reference.    Significant Diagnostic Studies: Dg Chest 2 View  Result Date: 05/09/2018 CLINICAL DATA:  Fever. Buttock wound. History of interstitial lung disease. EXAM: CHEST - 2 VIEW COMPARISON:  Chest radiograph April 06, 2018 FINDINGS: Cardiac silhouette is moderately enlarged unchanged. Status post median sternotomy for CABG with multiple fractured sternotomy wires. Similar chronic interstitial changes without pleural effusion or focal consolidation. No pneumothorax. Old LEFT rib fractures. IMPRESSION: 1. Stable interstitial prominence consistent with history of interstitial lung disease. 2. Stable cardiomegaly. Electronically Signed   By: Elon Alas M.D.   On: 05/09/2018 14:46   Ct Pelvis W Contrast  Result Date: 05/09/2018 CLINICAL DATA:  Buttock pain and fever. Sepsis muscle likely from buttock/perineal and cellulitis. Concern for possible necrotizing fasciitis. EXAM: CT PELVIS WITH CONTRAST TECHNIQUE: Multidetector CT imaging of the pelvis was performed using the standard protocol following the bolus administration of intravenous contrast. CONTRAST:  111m ISOVUE-300 IOPAMIDOL (ISOVUE-300) INJECTION 61% COMPARISON:  08/08/2017 CT FINDINGS: Urinary Tract: Physiologic distention of the urinary bladder is noted. The distal ureters are unremarkable. Bowel: Normal appearing included appendix. Scattered colonic diverticulosis along the distal descending and sigmoid colon without acute diverticulitis. Mechanical bowel obstruction or inflammation. 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 Atherosclerosis with ectasia of the left common iliac artery 1.5 cm. No inguinal, pelvic sidewall, retroperitoneal or iliac chain  adenopathy. Reproductive:  Normal size prostate and seminal vesicles. Other:  No free air nor free fluid. Musculoskeletal: Cellulitis of the medial right buttock with mottled peripherally enhancing soft tissue abscess or stigmata necrotizing fasciitis just caudad to the right ischium in the expected location of the distal right semimembranosus, semitendinosus, rectus femoris and quadratus femoris muscles and tendons. This collection contains mottled foci of gas and is estimated at 5.9 x 4.8 cm on series 3/130. The craniocaudad extent is difficult to ascertain on the reformats as it is incompletely included. Cortical bone loss is noted of the right ischial tuberosity is concerning for changes of osteomyelitis. IMPRESSION: 1. Abnormal enhancing soft tissue collection centered just caudad to the right ischial tuberosity measuring at least 5.9 x 4.7 cm hand AP by transverse dimension and involving the distal right semimembranosus, semitendinosus, rectus femoris and quadratus femoris muscles and tendons. This raises concern for soft tissue abscess and/or stigmata of necrotizing fasciitis. 2. Cortical bone loss of the right ischial tuberosity also raises concern changes of osteomyelitis. 3. 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 4. Ectasia of the left common iliac artery to 1.5 cm. Electronically Signed   By: DAshley RoyaltyM.D.   On: 05/09/2018 15:59   UKoreaEkg Site Rite  Result Date: 05/16/2018 If Site Rite image not attached, placement could not be confirmed due to current cardiac rhythm.   Microbiology: Recent Results (from the past 240 hour(s))  Culture, blood (Routine x 2)     Status: None   Collection Time: 05/09/18  1:32 PM  Result Value Ref Range Status   Specimen Description   Final    BLOOD RIGHT HAND Performed at [facility], [place] [address]   Special Requests  Final    BOTTLES DRAWN AEROBIC AND ANAEROBIC Blood Culture  adequate volume Performed at [place] [address] [place], [place] [address]    Culture   Final    NO GROWTH 5 DAYS Performed at [facility], [place] [address] [place], [place] [address]    Report Status 05/14/2018 FINAL  Final  Culture, blood (Routine x 2)     Status: None   Collection Time: 05/09/18  1:35 PM  Result Value Ref Range Status   Specimen Description   Final    BLOOD RIGHT FOREARM Performed at [facility] [address] [place], [place] [address]    Special Requests   Final    BOTTLES DRAWN AEROBIC AND ANAEROBIC Blood Culture adequate volume Performed at [place] [address], [place], [place] [address]    Culture   Final    NO GROWTH 5 DAYS Performed at [facility], [place] [address], [place], [place] [address]    Report Status 05/14/2018 FINAL  Final  Urine culture     Status: Abnormal   Collection Time: 05/09/18  3:46 PM  Result Value Ref Range Status   Specimen Description   Final    URINE, RANDOM Performed at [place] [address], [place], [place] [address]    Special Requests   Final    NONE Performed at [facility], [place] [address], [place], [place] [address]    Culture (A)  Final    <10,000 COLONIES/mL INSIGNIFICANT GROWTH Performed at [place] [address] [place], [place] [address]    Report Status 05/10/2018 FINAL  Final  Aerobic/Anaerobic Culture (surgical/deep wound)     Status: None   Collection Time: 05/09/18  8:34 PM  Result Value Ref Range Status   Specimen Description   Final    ABSCESS Performed at [facility] [address] [place], [place] [address]    Special Requests   Final    PERIRECTAL Performed at [facility], [place] [address], [place], [place] [address]    Gram Stain   Final    ABUNDANT WBC PRESENT, PREDOMINANTLY PMN MODERATE GRAM POSITIVE  COCCI FEW GRAM NEGATIVE RODS    Culture   Final    MODERATE CITROBACTER BRAAKII MODERATE VIRIDANS STREPTOCOCCUS MODERATE BACTEROIDES THETAIOTAOMICRON BETA LACTAMASE POSITIVE FEW EIKENELLA CORRODENS Usually susceptible to penicillin and other beta lactam agents,quinolones,macrolides and tetracyclines. Performed at [facility], [place] [address] [place], [place] [address]    Report Status 05/17/2018 FINAL  Final   Organism ID, Bacteria CITROBACTER BRAAKII  Final      Susceptibility   Citrobacter braakii - MIC*    CEFAZOLIN >=64 RESISTANT Resistant     CEFEPIME <=1 SENSITIVE Sensitive     CEFTAZIDIME <=1 SENSITIVE Sensitive     CEFTRIAXONE <=1 SENSITIVE Sensitive     CIPROFLOXACIN <=0.25 SENSITIVE Sensitive     GENTAMICIN <=1 SENSITIVE Sensitive     IMIPENEM 0.5 SENSITIVE Sensitive     TRIMETH/SULFA <=20 SENSITIVE Sensitive     PIP/TAZO <=4 SENSITIVE Sensitive     * MODERATE CITROBACTER BRAAKII  MRSA PCR Screening     Status: None   Collection Time: 05/10/18 12:32 AM  Result Value Ref Range Status   MRSA by PCR NEGATIVE NEGATIVE Final    Comment:        The GeneXpert MRSA Assay (FDA approved for NASAL  specimens only), is one component of a comprehensive MRSA colonization surveillance program. It is not intended to diagnose MRSA infection nor to guide or monitor treatment for MRSA infections. Performed at [facility], [place] [address] [place], [place] [address]      Labs: Basic Metabolic Panel: Recent Labs  Lab 05/13/18 0433 05/15/18 0413 05/16/18 0428 05/17/18 0534 05/18/18 0450  NA 140 137 137 139 139  K 4.2 3.5 3.5 4.1 3.9  CL 108 102 106 106 106  CO2 28 29 24 26 27   GLUCOSE 134* 97 134* 91 92  BUN 21 18 17 15 18   CREATININE 0.69 0.65 0.59* 0.51* 0.40*  CALCIUM 8.2* 7.9* 7.6* 8.1* 8.2*  MG 2.2  --   --  2.1 2.0   Liver Function Tests: No results for input(s): AST, ALT, ALKPHOS, BILITOT, PROT, ALBUMIN in the last 168 hours. No  results for input(s): LIPASE, AMYLASE in the last 168 hours. No results for input(s): AMMONIA in the last 168 hours. CBC: Recent Labs  Lab 05/12/18 0421 05/13/18 0433 05/15/18 0413 05/16/18 0428 05/18/18 0450  WBC 6.2 8.0 13.3* 9.3 6.7  NEUTROABS  --  5.6 10.3*  --   --   HGB 8.6* 8.9* 10.0* 9.0* 9.1*  HCT 27.9* 28.6* 32.4* 28.8* 29.7*  MCV 89.4 90.5 90.0 90.3 89.5  PLT 252 278 282 222 216   Cardiac Enzymes: No results for input(s): CKTOTAL, CKMB, CKMBINDEX, TROPONINI in the last 168 hours. BNP: BNP (last 3 results) Recent Labs    04/04/18 1505 04/05/18 0111 05/09/18 1332  BNP 134.7* 165.9* 232.1*    ProBNP (last 3 results) No results for input(s): PROBNP in the last 8760 hours.  CBG: Recent Labs  Lab 05/17/18 1143 05/17/18 1627 05/17/18 2126 05/18/18 0710 05/18/18 1141  GLUCAP 118* 130* 104* 94 97       Signed:  Irine Seal MD.  [facility] 05/18/2018, 12:41 PM

## 2018-05-18 NOTE — Consult Note (Signed)
WOC Nurse wound consult note I spoke with the primary care RN, Sophia and reviewed the record.  There are wound care orders already present and entered by the surgeon, as well as orders to treat the candidiasis.  The patient is being discharged to an extended care facility this afternoon.  No additional needs identified.  WOC consult will be cancelled. Helmut Muster, RN, MSN, CWOCN, CNS-BC, pager [phone]

## 2018-05-18 NOTE — Clinical Social Work Placement (Signed)
Patient received and accepted bed offer at [facility]. Facility aware of patient's discharge and confirmed bed offer. PTAR contacted, patient's family notified. Patient's RN can call report to [phone], packet complete. CSW signing off, no other needs identified at this time.  CLINICAL SOCIAL WORK PLACEMENT  NOTE  Date:  05/18/2018  Patient Details  Name: [name] MRN: [id number] Date of Birth: 1956-01-28  Clinical Social Work is seeking post-discharge placement for this patient at the Skilled  Nursing Facility level of care (*CSW will initial, date and re-position this form in  chart as items are completed):  Yes   Patient/family provided with [place] Clinical Social Work Department's list of facilities offering this level of care within the geographic area requested by the patient (or if unable, by the patient's family).  Yes   Patient/family informed of their freedom to choose among providers that offer the needed level of care, that participate in Medicare, Medicaid or managed care program needed by the patient, have an available bed and are willing to accept the patient.  Yes   Patient/family informed of [facility]'s ownership interest in [facility] and [facility], as well as of the fact that they are under no obligation to receive care at these facilities.  PASRR submitted to EDS on       PASRR number received on       Existing PASRR number confirmed on 05/12/18     FL2 transmitted to all facilities in geographic area requested by pt/family on 05/12/18     FL2 transmitted to all facilities within larger geographic area on       Patient informed that his/her managed care company has contracts with or will negotiate with certain facilities, including the following:        Yes   Patient/family informed of bed offers received.  Patient chooses bed at [facility]     Physician recommends and patient chooses bed at      Patient to be transferred  to [facility] on 05/18/18.  Patient to be transferred to facility by PTAR     Patient family notified on 05/18/18 of transfer.  Name of family member notified:  [facility]     PHYSICIAN       Additional Comment:    _______________________________________________ Antionette Poles, LCSW 05/18/2018, 2:43 PM

## 2018-05-18 NOTE — Progress Notes (Signed)
Pt  administered IV ABTs as ordered, Invanz administered and Diflucan is ordered. Pt for discharge this afternoon. SRP, RN SRP,RN

## 2018-05-19 ENCOUNTER — Encounter (HOSPITAL_BASED_OUTPATIENT_CLINIC_OR_DEPARTMENT_OTHER): Payer: Medicaid Other | Attending: Internal Medicine

## 2018-05-25 ENCOUNTER — Ambulatory Visit: Payer: Self-pay | Admitting: Adult Health

## 2018-06-01 ENCOUNTER — Encounter: Payer: Medicaid Other | Attending: Internal Medicine | Admitting: Internal Medicine

## 2018-06-01 DIAGNOSIS — M8610 Other acute osteomyelitis, unspecified site: Secondary | ICD-10-CM | POA: Diagnosis not present

## 2018-06-01 DIAGNOSIS — Y838 Other surgical procedures as the cause of abnormal reaction of the patient, or of later complication, without mention of misadventure at the time of the procedure: Secondary | ICD-10-CM | POA: Insufficient documentation

## 2018-06-01 DIAGNOSIS — T8131XD Disruption of external operation (surgical) wound, not elsewhere classified, subsequent encounter: Secondary | ICD-10-CM | POA: Diagnosis not present

## 2018-06-01 DIAGNOSIS — L89314 Pressure ulcer of right buttock, stage 4: Secondary | ICD-10-CM | POA: Diagnosis not present

## 2018-06-01 DIAGNOSIS — I2510 Atherosclerotic heart disease of native coronary artery without angina pectoris: Secondary | ICD-10-CM | POA: Diagnosis not present

## 2018-06-01 DIAGNOSIS — M069 Rheumatoid arthritis, unspecified: Secondary | ICD-10-CM | POA: Insufficient documentation

## 2018-06-01 DIAGNOSIS — I509 Heart failure, unspecified: Secondary | ICD-10-CM | POA: Insufficient documentation

## 2018-06-01 DIAGNOSIS — I252 Old myocardial infarction: Secondary | ICD-10-CM | POA: Diagnosis not present

## 2018-06-01 DIAGNOSIS — I110 Hypertensive heart disease with heart failure: Secondary | ICD-10-CM | POA: Diagnosis not present

## 2018-06-04 ENCOUNTER — Emergency Department (HOSPITAL_COMMUNITY): Payer: Medicaid Other

## 2018-06-04 ENCOUNTER — Inpatient Hospital Stay (HOSPITAL_COMMUNITY)
Admission: EM | Admit: 2018-06-04 | Discharge: 2018-06-08 | DRG: 698 | Disposition: A | Discharge status: Skilled Nursing Facility | Payer: Medicaid Other | Attending: Family Medicine | Admitting: Family Medicine

## 2018-06-04 ENCOUNTER — Other Ambulatory Visit: Payer: Self-pay

## 2018-06-04 ENCOUNTER — Encounter (HOSPITAL_COMMUNITY): Payer: Self-pay | Admitting: *Deleted

## 2018-06-04 DIAGNOSIS — J449 Chronic obstructive pulmonary disease, unspecified: Secondary | ICD-10-CM | POA: Diagnosis present

## 2018-06-04 DIAGNOSIS — L0231 Cutaneous abscess of buttock: Secondary | ICD-10-CM | POA: Diagnosis present

## 2018-06-04 DIAGNOSIS — K219 Gastro-esophageal reflux disease without esophagitis: Secondary | ICD-10-CM | POA: Diagnosis present

## 2018-06-04 DIAGNOSIS — K6812 Psoas muscle abscess: Secondary | ICD-10-CM | POA: Diagnosis present

## 2018-06-04 DIAGNOSIS — Z8673 Personal history of transient ischemic attack (TIA), and cerebral infarction without residual deficits: Secondary | ICD-10-CM | POA: Diagnosis not present

## 2018-06-04 DIAGNOSIS — I2510 Atherosclerotic heart disease of native coronary artery without angina pectoris: Secondary | ICD-10-CM | POA: Diagnosis present

## 2018-06-04 DIAGNOSIS — N3091 Cystitis, unspecified with hematuria: Secondary | ICD-10-CM | POA: Diagnosis present

## 2018-06-04 DIAGNOSIS — R319 Hematuria, unspecified: Secondary | ICD-10-CM | POA: Diagnosis not present

## 2018-06-04 DIAGNOSIS — K810 Acute cholecystitis: Secondary | ICD-10-CM | POA: Diagnosis not present

## 2018-06-04 DIAGNOSIS — I5042 Chronic combined systolic (congestive) and diastolic (congestive) heart failure: Secondary | ICD-10-CM | POA: Diagnosis present

## 2018-06-04 DIAGNOSIS — J849 Interstitial pulmonary disease, unspecified: Secondary | ICD-10-CM | POA: Diagnosis present

## 2018-06-04 DIAGNOSIS — K819 Cholecystitis, unspecified: Secondary | ICD-10-CM | POA: Diagnosis not present

## 2018-06-04 DIAGNOSIS — N4889 Other specified disorders of penis: Secondary | ICD-10-CM | POA: Diagnosis not present

## 2018-06-04 DIAGNOSIS — L89314 Pressure ulcer of right buttock, stage 4: Secondary | ICD-10-CM | POA: Diagnosis present

## 2018-06-04 DIAGNOSIS — N390 Urinary tract infection, site not specified: Secondary | ICD-10-CM | POA: Diagnosis not present

## 2018-06-04 DIAGNOSIS — Z7951 Long term (current) use of inhaled steroids: Secondary | ICD-10-CM

## 2018-06-04 DIAGNOSIS — B954 Other streptococcus as the cause of diseases classified elsewhere: Secondary | ICD-10-CM | POA: Diagnosis not present

## 2018-06-04 DIAGNOSIS — I252 Old myocardial infarction: Secondary | ICD-10-CM | POA: Diagnosis not present

## 2018-06-04 DIAGNOSIS — I714 Abdominal aortic aneurysm, without rupture: Secondary | ICD-10-CM | POA: Diagnosis present

## 2018-06-04 DIAGNOSIS — T83518A Infection and inflammatory reaction due to other urinary catheter, initial encounter: Principal | ICD-10-CM | POA: Diagnosis present

## 2018-06-04 DIAGNOSIS — Z960 Presence of urogenital implants: Secondary | ICD-10-CM | POA: Diagnosis not present

## 2018-06-04 DIAGNOSIS — Y846 Urinary catheterization as the cause of abnormal reaction of the patient, or of later complication, without mention of misadventure at the time of the procedure: Secondary | ICD-10-CM | POA: Diagnosis present

## 2018-06-04 DIAGNOSIS — R339 Retention of urine, unspecified: Secondary | ICD-10-CM | POA: Diagnosis present

## 2018-06-04 DIAGNOSIS — Z7902 Long term (current) use of antithrombotics/antiplatelets: Secondary | ICD-10-CM

## 2018-06-04 DIAGNOSIS — Z79899 Other long term (current) drug therapy: Secondary | ICD-10-CM

## 2018-06-04 DIAGNOSIS — N3090 Cystitis, unspecified without hematuria: Secondary | ICD-10-CM | POA: Diagnosis not present

## 2018-06-04 DIAGNOSIS — E785 Hyperlipidemia, unspecified: Secondary | ICD-10-CM | POA: Diagnosis present

## 2018-06-04 DIAGNOSIS — L02415 Cutaneous abscess of right lower limb: Secondary | ICD-10-CM | POA: Diagnosis not present

## 2018-06-04 DIAGNOSIS — L89214 Pressure ulcer of right hip, stage 4: Secondary | ICD-10-CM | POA: Diagnosis not present

## 2018-06-04 DIAGNOSIS — B9689 Other specified bacterial agents as the cause of diseases classified elsewhere: Secondary | ICD-10-CM | POA: Diagnosis not present

## 2018-06-04 DIAGNOSIS — F17210 Nicotine dependence, cigarettes, uncomplicated: Secondary | ICD-10-CM | POA: Diagnosis present

## 2018-06-04 DIAGNOSIS — I25118 Atherosclerotic heart disease of native coronary artery with other forms of angina pectoris: Secondary | ICD-10-CM | POA: Diagnosis not present

## 2018-06-04 DIAGNOSIS — M869 Osteomyelitis, unspecified: Secondary | ICD-10-CM | POA: Diagnosis present

## 2018-06-04 DIAGNOSIS — Z951 Presence of aortocoronary bypass graft: Secondary | ICD-10-CM

## 2018-06-04 DIAGNOSIS — M069 Rheumatoid arthritis, unspecified: Secondary | ICD-10-CM | POA: Diagnosis present

## 2018-06-04 DIAGNOSIS — L8990 Pressure ulcer of unspecified site, unspecified stage: Secondary | ICD-10-CM

## 2018-06-04 LAB — CBC WITH DIFFERENTIAL/PLATELET
Basophils Absolute: 0 10*3/uL (ref 0.0–0.1)
Basophils Relative: 0 %
EOS ABS: 0.2 10*3/uL (ref 0.0–0.7)
EOS PCT: 3 %
HCT: 36.1 % — ABNORMAL LOW (ref 39.0–52.0)
Hemoglobin: 11.2 g/dL — ABNORMAL LOW (ref 13.0–17.0)
Lymphocytes Relative: 42 %
Lymphs Abs: 3 10*3/uL (ref 0.7–4.0)
MCH: 27.3 pg (ref 26.0–34.0)
MCHC: 31 g/dL (ref 30.0–36.0)
MCV: 87.8 fL (ref 78.0–100.0)
MONOS PCT: 9 %
Monocytes Absolute: 0.6 10*3/uL (ref 0.1–1.0)
Neutro Abs: 3.3 10*3/uL (ref 1.7–7.7)
Neutrophils Relative %: 46 %
PLATELETS: 248 10*3/uL (ref 150–400)
RBC: 4.11 MIL/uL — ABNORMAL LOW (ref 4.22–5.81)
RDW: 16.3 % — ABNORMAL HIGH (ref 11.5–15.5)
WBC: 7.1 10*3/uL (ref 4.0–10.5)

## 2018-06-04 LAB — LIPASE, BLOOD: Lipase: 32 U/L (ref 11–51)

## 2018-06-04 LAB — PROTIME-INR
INR: 1
Prothrombin Time: 13.1 seconds (ref 11.4–15.2)

## 2018-06-04 LAB — URINALYSIS, ROUTINE W REFLEX MICROSCOPIC
BILIRUBIN URINE: NEGATIVE
Glucose, UA: 100 mg/dL — AB
Nitrite: POSITIVE — AB
PROTEIN: 100 mg/dL — AB
Specific Gravity, Urine: >1.03 — ABNORMAL HIGH (ref 1.005–1.030)
pH: 6.5 (ref 5.0–8.0)

## 2018-06-04 LAB — URINALYSIS, MICROSCOPIC (REFLEX)

## 2018-06-04 LAB — I-STAT CG4 LACTIC ACID, ED
LACTIC ACID, VENOUS: 0.99 mmol/L (ref 0.5–1.9)
Lactic Acid, Venous: 0.81 mmol/L (ref 0.5–1.9)

## 2018-06-04 LAB — COMPREHENSIVE METABOLIC PANEL
ALK PHOS: 94 U/L (ref 38–126)
ALT: 15 U/L (ref 0–44)
AST: 15 U/L (ref 15–41)
Albumin: 2.8 g/dL — ABNORMAL LOW (ref 3.5–5.0)
Anion gap: 8 (ref 5–15)
BILIRUBIN TOTAL: 1.1 mg/dL (ref 0.3–1.2)
BUN: 14 mg/dL (ref 8–23)
CHLORIDE: 106 mmol/L (ref 98–111)
CO2: 26 mmol/L (ref 22–32)
Calcium: 8.4 mg/dL — ABNORMAL LOW (ref 8.9–10.3)
Creatinine, Ser: 0.58 mg/dL — ABNORMAL LOW (ref 0.61–1.24)
GFR calc Af Amer: >60 mL/min (ref >60)
Glucose, Bld: 105 mg/dL — ABNORMAL HIGH (ref 70–99)
Potassium: 3.4 mmol/L — ABNORMAL LOW (ref 3.5–5.1)
Sodium: 140 mmol/L (ref 135–145)
Total Protein: 6.1 g/dL — ABNORMAL LOW (ref 6.5–8.1)

## 2018-06-04 MED ORDER — FLUCONAZOLE IN SODIUM CHLORIDE 400-0.9 MG/200ML-% IV SOLN
800.0000 mg | Freq: Once | INTRAVENOUS | Status: AC
  Administered 2018-06-04: 800 mg via INTRAVENOUS
  Filled 2018-06-04: qty 400

## 2018-06-04 MED ORDER — SODIUM CHLORIDE 0.9 % IV BOLUS
500.0000 mL | Freq: Once | INTRAVENOUS | Status: AC
  Administered 2018-06-04: 500 mL via INTRAVENOUS

## 2018-06-04 MED ORDER — ONDANSETRON HCL 4 MG PO TABS
4.0000 mg | ORAL_TABLET | Freq: Four times a day (QID) | ORAL | Status: DC | PRN
Start: 2018-06-04 — End: 2018-06-08

## 2018-06-04 MED ORDER — IOPAMIDOL (ISOVUE-300) INJECTION 61%
INTRAVENOUS | Status: AC
  Filled 2018-06-04: qty 100

## 2018-06-04 MED ORDER — SODIUM CHLORIDE 0.9 % IV SOLN
1.0000 g | Freq: Three times a day (TID) | INTRAVENOUS | Status: DC
  Administered 2018-06-05 – 2018-06-06 (×5): 1 g via INTRAVENOUS
  Filled 2018-06-04 (×6): qty 1

## 2018-06-04 MED ORDER — OXYCODONE HCL 5 MG PO TABS: 5.0000 mg | ORAL_TABLET | ORAL | Status: DC | PRN

## 2018-06-04 MED ORDER — IPRATROPIUM-ALBUTEROL 0.5-2.5 (3) MG/3ML IN SOLN
3.0000 mL | Freq: Two times a day (BID) | RESPIRATORY_TRACT | Status: DC
  Administered 2018-06-04 – 2018-06-07 (×7): 3 mL via RESPIRATORY_TRACT
  Filled 2018-06-04 (×7): qty 3

## 2018-06-04 MED ORDER — SODIUM CHLORIDE 0.9 % IR SOLN
3000.0000 mL | Status: DC
  Administered 2018-06-05: 3000 mL via INTRAVESICAL

## 2018-06-04 MED ORDER — SACCHAROMYCES BOULARDII 250 MG PO CAPS
250.0000 mg | ORAL_CAPSULE | Freq: Two times a day (BID) | ORAL | Status: DC
  Administered 2018-06-04 – 2018-06-06 (×4): 250 mg via ORAL
  Filled 2018-06-04 (×4): qty 1

## 2018-06-04 MED ORDER — CLOPIDOGREL BISULFATE 75 MG PO TABS
75.0000 mg | ORAL_TABLET | Freq: Every day | ORAL | Status: DC
  Administered 2018-06-05 – 2018-06-07 (×3): 75 mg via ORAL
  Filled 2018-06-04 (×3): qty 1

## 2018-06-04 MED ORDER — IPRATROPIUM-ALBUTEROL 0.5-2.5 (3) MG/3ML IN SOLN
3.0000 mL | Freq: Once | RESPIRATORY_TRACT | Status: AC
  Administered 2018-06-04: 3 mL via RESPIRATORY_TRACT
  Filled 2018-06-04: qty 3

## 2018-06-04 MED ORDER — CARVEDILOL 6.25 MG PO TABS
6.2500 mg | ORAL_TABLET | Freq: Two times a day (BID) | ORAL | Status: DC
  Administered 2018-06-05 – 2018-06-07 (×6): 6.25 mg via ORAL
  Filled 2018-06-04 (×6): qty 1

## 2018-06-04 MED ORDER — ALBUTEROL SULFATE (2.5 MG/3ML) 0.083% IN NEBU: 2.5000 mg | INHALATION_SOLUTION | RESPIRATORY_TRACT | Status: DC | PRN

## 2018-06-04 MED ORDER — ALBUTEROL SULFATE (2.5 MG/3ML) 0.083% IN NEBU: 2.5000 mg | INHALATION_SOLUTION | Freq: Four times a day (QID) | RESPIRATORY_TRACT | Status: DC | PRN

## 2018-06-04 MED ORDER — GABAPENTIN 300 MG PO CAPS
300.0000 mg | ORAL_CAPSULE | Freq: Three times a day (TID) | ORAL | Status: DC
  Administered 2018-06-04 – 2018-06-07 (×10): 300 mg via ORAL
  Filled 2018-06-04 (×10): qty 1

## 2018-06-04 MED ORDER — MEROPENEM 1 G IV SOLR
1.0000 g | INTRAVENOUS | Status: AC
  Administered 2018-06-04: 1 g via INTRAVENOUS
  Filled 2018-06-04: qty 1

## 2018-06-04 MED ORDER — OXYCODONE-ACETAMINOPHEN 5-325 MG PO TABS
2.0000 | ORAL_TABLET | Freq: Once | ORAL | Status: AC
  Administered 2018-06-04: 2 via ORAL
  Filled 2018-06-04: qty 2

## 2018-06-04 MED ORDER — ACETAMINOPHEN 500 MG PO TABS: 1000.0000 mg | ORAL_TABLET | Freq: Four times a day (QID) | ORAL | Status: DC | PRN

## 2018-06-04 MED ORDER — LIDOCAINE HCL URETHRAL/MUCOSAL 2 % EX GEL
1.0000 "application " | Freq: Once | CUTANEOUS | Status: DC
  Filled 2018-06-04: qty 5

## 2018-06-04 MED ORDER — PREDNISONE 10 MG PO TABS
10.0000 mg | ORAL_TABLET | Freq: Every day | ORAL | Status: DC
  Administered 2018-06-05 – 2018-06-07 (×3): 10 mg via ORAL
  Filled 2018-06-04 (×3): qty 1

## 2018-06-04 MED ORDER — ADULT MULTIVITAMIN W/MINERALS CH
1.0000 | ORAL_TABLET | Freq: Every day | ORAL | Status: DC
  Administered 2018-06-05 – 2018-06-07 (×3): 1 via ORAL
  Filled 2018-06-04 (×3): qty 1

## 2018-06-04 MED ORDER — VANCOMYCIN HCL IN DEXTROSE 1-5 GM/200ML-% IV SOLN
1000.0000 mg | Freq: Three times a day (TID) | INTRAVENOUS | Status: DC
  Administered 2018-06-05 – 2018-06-06 (×4): 1000 mg via INTRAVENOUS
  Filled 2018-06-04 (×4): qty 200

## 2018-06-04 MED ORDER — MORPHINE SULFATE (PF) 2 MG/ML IV SOLN
1.0000 mg | INTRAVENOUS | Status: DC | PRN
  Administered 2018-06-04 – 2018-06-07 (×3): 1 mg via INTRAVENOUS
  Filled 2018-06-04 (×3): qty 1

## 2018-06-04 MED ORDER — MAGNESIUM HYDROXIDE 400 MG/5ML PO SUSP: 30.0000 mL | Freq: Every day | ORAL | Status: DC | PRN

## 2018-06-04 MED ORDER — PRO-STAT SUGAR FREE PO LIQD
30.0000 mL | Freq: Two times a day (BID) | ORAL | Status: DC
  Administered 2018-06-04 – 2018-06-07 (×5): 30 mL via ORAL
  Filled 2018-06-04 (×7): qty 30

## 2018-06-04 MED ORDER — ATORVASTATIN CALCIUM 40 MG PO TABS: 40.0000 mg | ORAL_TABLET | Freq: Every day | ORAL | Status: DC

## 2018-06-04 MED ORDER — HYDROCODONE-ACETAMINOPHEN 10-325 MG PO TABS
1.0000 | ORAL_TABLET | Freq: Three times a day (TID) | ORAL | Status: DC
  Administered 2018-06-04 – 2018-06-07 (×10): 1 via ORAL
  Filled 2018-06-04 (×10): qty 1

## 2018-06-04 MED ORDER — ONDANSETRON HCL 4 MG/2ML IJ SOLN: 4.0000 mg | Freq: Four times a day (QID) | INTRAMUSCULAR | Status: DC | PRN

## 2018-06-04 MED ORDER — POLYETHYLENE GLYCOL 3350 17 G PO PACK
17.0000 g | PACK | Freq: Two times a day (BID) | ORAL | Status: DC
  Administered 2018-06-04 – 2018-06-06 (×4): 17 g via ORAL
  Filled 2018-06-04 (×6): qty 1

## 2018-06-04 MED ORDER — NICOTINE 7 MG/24HR TD PT24
7.0000 mg | MEDICATED_PATCH | Freq: Every day | TRANSDERMAL | Status: DC
  Administered 2018-06-05 – 2018-06-07 (×3): 7 mg via TRANSDERMAL
  Filled 2018-06-04 (×3): qty 1

## 2018-06-04 MED ORDER — VANCOMYCIN HCL 10 G IV SOLR
1500.0000 mg | INTRAVENOUS | Status: AC
  Administered 2018-06-04: 1500 mg via INTRAVENOUS
  Filled 2018-06-04: qty 1500

## 2018-06-04 MED ORDER — DEXTROSE-NACL 5-0.45 % IV SOLN
INTRAVENOUS | Status: DC
  Administered 2018-06-04 – 2018-06-07 (×3): via INTRAVENOUS

## 2018-06-04 MED ORDER — POTASSIUM CHLORIDE CRYS ER 20 MEQ PO TBCR
40.0000 meq | EXTENDED_RELEASE_TABLET | Freq: Every day | ORAL | Status: DC
  Administered 2018-06-04 – 2018-06-07 (×4): 40 meq via ORAL
  Filled 2018-06-04 (×3): qty 2

## 2018-06-04 MED ORDER — ENOXAPARIN SODIUM 40 MG/0.4ML ~~LOC~~ SOLN
40.0000 mg | SUBCUTANEOUS | Status: DC
  Administered 2018-06-04 – 2018-06-06 (×3): 40 mg via SUBCUTANEOUS
  Filled 2018-06-04 (×4): qty 0.4

## 2018-06-04 MED ORDER — MIRTAZAPINE 15 MG PO TABS
7.5000 mg | ORAL_TABLET | Freq: Every day | ORAL | Status: DC
  Administered 2018-06-04 – 2018-06-07 (×4): 7.5 mg via ORAL
  Filled 2018-06-04 (×5): qty 1

## 2018-06-04 MED ORDER — JUVEN PO PACK
1.0000 | PACK | Freq: Two times a day (BID) | ORAL | Status: DC
  Administered 2018-06-05 – 2018-06-06 (×3): 1 via ORAL
  Filled 2018-06-04 (×6): qty 1

## 2018-06-04 MED ORDER — FLUCONAZOLE IN SODIUM CHLORIDE 400-0.9 MG/200ML-% IV SOLN
400.0000 mg | INTRAVENOUS | Status: DC
  Administered 2018-06-05: 400 mg via INTRAVENOUS
  Filled 2018-06-04 (×2): qty 200

## 2018-06-04 MED ORDER — IOPAMIDOL (ISOVUE-300) INJECTION 61%
100.0000 mL | Freq: Once | INTRAVENOUS | Status: AC | PRN
  Administered 2018-06-04: 100 mL via INTRAVENOUS

## 2018-06-04 NOTE — ED Notes (Signed)
Patient taken to the floor by tech. 

## 2018-06-04 NOTE — H&P (Signed)
[facility]                                                                                    Patient Demographics  [name], is a 62 y.o. male  CSN: [id number]  MRN: [id number]  DOB - 06/08/1956  Admit Date - 06/04/2018  Outpatient Primary MD for the patient is [name], PA-C   With History of -  Past Medical History:  Diagnosis Date  . AAA (abdominal aortic aneurysm) ([place])   . AKI (acute kidney injury) ([place])    a. Cr up to 8 in 04/2018.  . Arthritis   . CAD in native artery    a. h/o MI x 3 s/p CABG x 4 (1993 with Servando Snare). b. LHC 01/2017 with severe native disease, 2 grafts occluded.  . Carotid artery disease ([place])    a. s/p R CEA 11/2016.  [name] Chronic combined systolic and diastolic CHF (congestive heart failure) ([place])   . GERD (gastroesophageal reflux disease)   . Hyperkalemia   . ILD (interstitial lung disease) ([place])   . Lung nodule   . MI (myocardial infarction) ([place])    x 3   . Normocytic anemia   . NSVT (nonsustained ventricular tachycardia) ([place])   . Pancreatitis    1/19  . Pneumonia 11/2017  . RA (rheumatoid arthritis) ([place])   . Stroke ([place])    a. 2018. b. possible recurrence in 04/2018, seen by neuro  . Tobacco abuse       Past Surgical History:  Procedure Laterality Date  . CARDIAC CATHETERIZATION    . CAROTID ENDARTERECTOMY Left   . Heart Bypass  10/1992  . INCISION AND DRAINAGE PERIRECTAL ABSCESS Right 05/09/2018   Procedure: IRRIGATION AND DEBRIDEMENT  RIGHT BUTTOCK ABSCESS;  Surgeon: Armandina Gemma, MD;  Location: [facility] ORS;  Service: General;  Laterality: Right;  . LEFT HEART CATH AND CORS/GRAFTS ANGIOGRAPHY N/A 03/01/2017   Procedure: Left Heart Cath and Cors/Grafts Angiography;  Surgeon: Belva Crome, MD;  Location: [facility];  Service: Cardiovascular;  Laterality: N/A;  . Throat Biopsy     Cat scratch fever    in for   Chief Complaint  Patient presents with  . Blood in Cath     HPI  [name]  is a  62 y.o. male, who has been recently admitted on 05/09/2018 for gluteal abscess and osteomyelitis on the right, status post I&D by Dr. Harlow Asa, status post treatment with vancomycin  clindamycin and Zosyn and later on transitioned to ertapenem which he is still on, presenting with dysuria, hematuria and penile pain.  No history of fever chills nausea or vomiting .  CT of abdomen was consistent with acute cholecystitis and small pericholecystic abscess.  4.2 cm abscess in the abductor muscles of the proximal right thigh was noted , mildly decreased compared to the previous study.  He was also noted to have findings of cystitis and a stable 4.6 cm abdominal aortic aneurysm.  [name] was switched to meropenem to cover for resistant Pseudomonas and vancomycin and Diflucan were started.  Patient will be admitted to [place] and surgery were consulted for the acute  cholecystitis and the abscess noted in the proximal thigh.  Patient has a history of coronary artery disease status post CABG and interstitial lung disease with history of peripheral vascular disease status post carotid endarterectomy and history of pancreatitis  Review of Systems    In addition to the HPI above,  No Fever-chills, No Headache, No changes with Vision or hearing, No problems swallowing food or Liquids, No Chest pain, Cough or Shortness of Breath, No Abdominal pain, No Nausea or Vommitting, Bowel movements are regular, No dysuria, No new skin rashes or bruises, No new joints pains-aches,  No new weakness, tingling, numbness in any extremity, No recent weight gain or loss, No polyuria, polydypsia or polyphagia, No significant Mental Stressors.  A full 10 point Review of Systems was done, except as stated above, all other Review of Systems were negative.   Social History Social History   Tobacco Use  . Smoking status: Current Every Day Smoker    Packs/day: 0.50    Years: 47.00    Pack years: 23.50    Types: Cigarettes  .  Smokeless tobacco: Never Used  . Tobacco comment: half pack every other day  Substance Use Topics  . Alcohol use: No    Comment: remote history of beer drinking     Family History Family History  Problem Relation Age of Onset  . Heart murmur Mother   . Heart disease Mother   . Heart attack Mother   . Suicidality Father   . Heart attack Father   . Stroke Brother   . Heart attack Brother   . Heart attack Maternal Grandmother   . Cancer Maternal Grandmother        unknown type  . Lung cancer Maternal Aunt   . Lung cancer Maternal Uncle   . Cancer Maternal Aunt        unknown cancer  . Heart attack Brother      Prior to Admission medications   Medication Sig Start Date End Date Taking? Authorizing Provider  acetaminophen (TYLENOL) 500 MG tablet Take 1,000 mg by mouth every 6 (six) hours as needed for mild pain.     [provider]  albuterol (PROVENTIL) (2.5 MG/3ML) 0.083% nebulizer solution Take 2.5 mg by nebulization every 6 (six) hours as needed for wheezing or shortness of breath.    [provider]  Amino Acids-Protein Hydrolys (FEEDING SUPPLEMENT, PRO-STAT SUGAR FREE 64,) LIQD Take 30 mLs by mouth 2 (two) times daily. 05/18/18   Eugenie Filler, MD  atorvastatin (LIPITOR) 40 MG tablet Take 1 tablet (40 mg total) by mouth daily at 6 PM. 03/15/17   Dunn, Nedra Hai, PA-C  Calcium Carbonate Antacid (MAALOX PO) Take 30 mLs by mouth every 4 (four) hours as needed (indigestion). And every 48 hours    [provider]  carvedilol (COREG) 6.25 MG tablet Take 1 tablet (6.25 mg total) by mouth 2 (two) times daily with a meal. 05/18/18 06/17/18  Eugenie Filler, MD  clopidogrel (PLAVIX) 75 MG tablet Take 1 tablet (75 mg total) by mouth daily. 05/18/18 06/17/18  Eugenie Filler, MD  enoxaparin (LOVENOX) 40 MG/0.4ML injection Inject 0.4 mLs (40 mg total) into the skin daily. 05/19/18   Eugenie Filler, MD  ertapenem [facility]) IVPB Inject 1 g into the vein daily  for 34 doses. Indication:  Wound infection Last Day of Therapy:  06/19/2018 Labs - Once weekly:  CBC/D and BMP, Labs - Every other week:  ESR and CRP 05/18/18  06/21/18  Eugenie Filler, MD  gabapentin (NEURONTIN) 300 MG capsule Take 1 capsule (300 mg total) by mouth every 8 (eight) hours. 01/18/18   Gillis Santa, MD  HYDROcodone-acetaminophen (NORCO) 10-325 MG tablet Take 1 tablet by mouth 3 (three) times daily. 05/18/18   Eugenie Filler, MD  hydroxychloroquine (PLAQUENIL) 200 MG tablet Take 1 tablet (200 mg total) by mouth 2 (two) times daily. 06/22/18   Eugenie Filler, MD  ipratropium-albuterol (DUONEB) 0.5-2.5 (3) MG/3ML SOLN Take 3 mLs by nebulization 2 (two) times daily. 05/18/18   Eugenie Filler, MD  magnesium hydroxide (MILK OF MAGNESIA) 400 MG/5ML suspension Take 30 mLs by mouth daily as needed for mild constipation. And QHS    [provider]  Menthol-Methyl Salicylate (MUSCLE RUB EX) Apply 1 application topically as needed (for shoulder or knee pain).     [provider]  mirtazapine (REMERON) 7.5 MG tablet Take 7.5 mg by mouth at bedtime.    [provider]  morphine (MSIR) 30 MG tablet Take 1 tablet (30 mg total) by mouth every 4 (four) hours as needed (breakthrough pain). 05/18/18   Eugenie Filler, MD  Multiple Vitamin (MULTIVITAMIN WITH MINERALS) TABS tablet Take 1 tablet by mouth daily. 05/19/18   Eugenie Filler, MD  nicotine (NICODERM CQ - DOSED IN MG/24 HR) 7 mg/24hr patch Place 1 patch (7 mg total) onto the skin daily. 04/13/18   Elodia Florence., MD  nutrition supplement, JUVEN, (JUVEN) PACK Take 1 packet by mouth 2 (two) times daily between meals. 05/18/18   Eugenie Filler, MD  nystatin cream (MYCOSTATIN) Apply topically 2 (two) times daily. 05/18/18   Eugenie Filler, MD  OXYGEN Inhale 2 L into the lungs at bedtime.    [provider]  polyethylene glycol (MIRALAX / GLYCOLAX) packet Take 17 g by mouth 2 (two) times  daily. Until having 1 soft BM daily, then can decrease to daily or as needed 04/12/18   Elodia Florence., MD  potassium chloride SA (K-DUR,KLOR-CON) 20 MEQ tablet Take 2 tablets (40 mEq total) by mouth daily. 05/19/18   Eugenie Filler, MD  predniSONE (DELTASONE) 10 MG tablet Take 10 mg daily 05/04/17   [provider]  PROVENTIL HFA 108 (90 Base) MCG/ACT inhaler INHALE 2 PUFFS EVERY 6 HOURS AS NEEDED FOR WHEEZE/SHORTNESS OF BREATH 03/29/18   Brand Males, MD  saccharomyces boulardii (FLORASTOR) 250 MG capsule Take 1 capsule (250 mg total) by mouth 2 (two) times daily. 05/18/18   Eugenie Filler, MD  traMADol (ULTRAM) 50 MG tablet Take 2 tablets (100 mg total) by mouth every 6 (six) hours as needed (mild pain if tylenol not effective). 05/18/18   Eugenie Filler, MD    No Known Allergies  Physical Exam  Vitals  Blood pressure 129/80, pulse 85, resp. rate 18, height _0  (1.854 m), weight 88.5 kg (195 lb), SpO2 92 %.   1. General chronically ill male, looks tired with  2.  Flat affect and insight, alert and awake, mildly confused.  3. No F.N deficits, grossly, patient moving all extremities.  4. Ears and Eyes appear Normal, Conjunctivae clear, PERRLA. Moist Oral Mucosa.  5. Supple Neck, No JVD, No cervical lymphadenopathy appriciated, No Carotid Bruits.  6. Symmetrical Chest wall movement, Good air movement bilaterally, CTAB.  7. RRR, No Gallops, Rubs or Murmurs, No Parasternal Heave.  8. Positive Bowel Sounds, Abdomen Soft, Non tender, ,No rebound -guarding or rigidity.  9.  No Cyanosis, Normal Skin Turgor, No Skin Rash or Bruise.  10. Good muscle tone,  joints appear normal , no effusions, Normal ROM.    Data Review  CBC Recent Labs  Lab 06/04/18 1525  WBC 7.1  HGB 11.2*  HCT 36.1*  PLT 248  MCV 87.8  MCH 27.3  MCHC 31.0  RDW 16.3*  LYMPHSABS 3.0  MONOABS 0.6  EOSABS 0.2  BASOSABS 0.0    ------------------------------------------------------------------------------------------------------------------  Chemistries  Recent Labs  Lab 06/04/18 1525  NA 140  K 3.4*  CL 106  CO2 26  GLUCOSE 105*  BUN 14  CREATININE 0.58*  CALCIUM 8.4*  AST 15  ALT 15  ALKPHOS 94  BILITOT 1.1   ------------------------------------------------------------------------------------------------------------------ estimated creatinine clearance is 108.2 mL/min (A) (by C-G formula based on SCr of 0.58 mg/dL (L)). ------------------------------------------------------------------------------------------------------------------ No results for input(s): TSH, T4TOTAL, T3FREE, THYROIDAB in the last 72 hours.  Invalid input(s): FREET3   Coagulation profile Recent Labs  Lab 06/04/18 1525  INR 1.00   ------------------------------------------------------------------------------------------------------------------- No results for input(s): DDIMER in the last 72 hours. -------------------------------------------------------------------------------------------------------------------  Cardiac Enzymes No results for input(s): CKMB, TROPONINI, MYOGLOBIN in the last 168 hours.  Invalid input(s): CK ------------------------------------------------------------------------------------------------------------------ Invalid input(s): POCBNP   ---------------------------------------------------------------------------------------------------------------  Urinalysis    Component Value Date/Time   COLORURINE RED (A) 06/04/2018 1525   APPEARANCEUR CLOUDY (A) 06/04/2018 1525   LABSPEC >1.030 (H) 06/04/2018 1525   PHURINE 6.5 06/04/2018 1525   GLUCOSEU 100 (A) 06/04/2018 1525   HGBUR LARGE (A) 06/04/2018 1525   BILIRUBINUR NEGATIVE 06/04/2018 1525   KETONESUR TRACE (A) 06/04/2018 1525   PROTEINUR 100 (A) 06/04/2018 1525   NITRITE POSITIVE (A) 06/04/2018 1525   LEUKOCYTESUR TRACE (A)  06/04/2018 1525    ----------------------------------------------------------------------------------------------------------------   Imaging results:   Dg Chest 2 View  Result Date: 06/04/2018 CLINICAL DATA:  Bleeding into catheter bag.  Wheezing. EXAM: CHEST - 2 VIEW COMPARISON:  05/09/2018 and 04/06/2018 radiographs.  CT 08/17/2017. FINDINGS: There is stable cardiomegaly status post median sternotomy and CABG. The upper sternotomy wires are chronically fractured. Right arm PICC extends to the level of the lower SVC. There is extensive chronic interstitial lung disease which appears slightly worse, possibly due to superimposed edema or inflammation. No confluent airspace opacity or significant pleural effusion. Old rib fractures are present on the left. IMPRESSION: Interval increased pulmonary opacities bilaterally, suspicious for edema or inflammation superimposed on chronic interstitial lung disease. Stable cardiomegaly. Electronically Signed   By: Richardean Sale M.D.   On: 06/04/2018 16:11   Dg Chest 2 View  Result Date: 05/09/2018 CLINICAL DATA:  Fever. Buttock wound. History of interstitial lung disease. EXAM: CHEST - 2 VIEW COMPARISON:  Chest radiograph April 06, 2018 FINDINGS: Cardiac silhouette is moderately enlarged unchanged. Status post median sternotomy for CABG with multiple fractured sternotomy wires. Similar chronic interstitial changes without pleural effusion or focal consolidation. No pneumothorax. Old LEFT rib fractures. IMPRESSION: 1. Stable interstitial prominence consistent with history of interstitial lung disease. 2. Stable cardiomegaly. Electronically Signed   By: Elon Alas M.D.   On: 05/09/2018 14:46   Ct Pelvis W Contrast  Result Date: 05/09/2018 CLINICAL DATA:  Buttock pain and fever. Sepsis muscle likely from buttock/perineal and cellulitis. Concern for possible necrotizing fasciitis. EXAM: CT PELVIS WITH CONTRAST TECHNIQUE: Multidetector CT imaging of the pelvis  was performed using the standard protocol following the bolus administration of intravenous contrast. CONTRAST:  165m ISOVUE-300 IOPAMIDOL (ISOVUE-300) INJECTION 61% COMPARISON:  08/08/2017 CT FINDINGS:  Urinary Tract: Physiologic distention of the urinary bladder is noted. The distal ureters are unremarkable. Bowel: Normal appearing included appendix. Scattered colonic diverticulosis along the distal descending and sigmoid colon without acute diverticulitis. Mechanical bowel obstruction or inflammation. 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 Atherosclerosis with ectasia of the left common iliac artery 1.5 cm. No inguinal, pelvic sidewall, retroperitoneal or iliac chain adenopathy. Reproductive:  Normal size prostate and seminal vesicles. Other:  No free air nor free fluid. Musculoskeletal: Cellulitis of the medial right buttock with mottled peripherally enhancing soft tissue abscess or stigmata necrotizing fasciitis just caudad to the right ischium in the expected location of the distal right semimembranosus, semitendinosus, rectus femoris and quadratus femoris muscles and tendons. This collection contains mottled foci of gas and is estimated at 5.9 x 4.8 cm on series 3/130. The craniocaudad extent is difficult to ascertain on the reformats as it is incompletely included. Cortical bone loss is noted of the right ischial tuberosity is concerning for changes of osteomyelitis. IMPRESSION: 1. Abnormal enhancing soft tissue collection centered just caudad to the right ischial tuberosity measuring at least 5.9 x 4.7 cm hand AP by transverse dimension and involving the distal right semimembranosus, semitendinosus, rectus femoris and quadratus femoris muscles and tendons. This raises concern for soft tissue abscess and/or stigmata of necrotizing fasciitis. 2. Cortical bone loss of the right ischial tuberosity also raises concern changes of osteomyelitis. 3. Aneurysmal  dilatation at the aortic bifurcation to 3.4 cm transverse. 4. Ectasia of the left common iliac artery to 1.5 cm. Electronically Signed   By: Ashley Royalty M.D.   On: 05/09/2018 15:59   Ct Abdomen Pelvis W Contrast  Result Date: 06/04/2018 CLINICAL DATA:  Nausea and vomiting. Hematuria. Pyelonephritis. Pelvic decubitus ulcers. Abdominal aortic aneurysm. EXAM: CT ABDOMEN AND PELVIS WITH CONTRAST TECHNIQUE: Multidetector CT imaging of the abdomen and pelvis was performed using the standard protocol following bolus administration of intravenous contrast. CONTRAST:  16m ISOVUE-300 IOPAMIDOL (ISOVUE-300) INJECTION 61% COMPARISON:  Pelvis CT on 05/09/2018 and AP CT on 08/08/2017 FINDINGS: Lower Chest: No acute findings. Stable bibasilar interstitial fibrosis and mild bronchiectasis. Hepatobiliary: No hepatic masses identified. The gallbladder is mildly dilated and demonstrates diffuse nodular wall thickening. There is mild adjacent edema in right hepatic lobe. A small pericholecystic fluid collection is also seen between the gallbladder and right hepatic lobe, with adjacent small defects in the gallbladder wall, consistent with contained perforation. These findings are highly suspicious for acute cholecystitis with pericholecystic abscess. No evidence of biliary ductal dilatation. Pancreas:  No mass or inflammatory changes. Spleen: Within normal limits in size and appearance. Adrenals/Urinary Tract: No masses identified. No evidence of hydronephrosis. A Foley catheter is seen within urinary bladder. Despite being nearly empty gallbladder wall appears diffusely thickened and suspicious for cystitis. Stomach/Bowel: No evidence of obstruction, inflammatory process or abnormal fluid collections. Normal appendix visualized. Vascular/Lymphatic: No pathologically enlarged lymph nodes. Infrarenal abdominal aortic aneurysm is seen which measures 4.6 cm in maximum diameter, without significant change since previous study. No  evidence of aneurysm leak or rupture. Reproductive:  No mass or other significant abnormality. Other:  No evidence of ascites or abscess. Musculoskeletal: A large deep decubitus ulcer is again seen in the right buttock which extends to the right ischial tuberosity. Cortical thinning and irregularity is seen involving the right ischial tuberosity at this site, suspicious for osteomyelitis. A 4.2 cm rim enhancing fluid collection is seen in the adductor muscles of the proximal right thigh. This  has decreased in size compared to approximately 5 cm on previous study, and is consistent with abscess. IMPRESSION: Findings consistent with acute cholecystitis and small pericholecystic abscess. Surgical consultation is recommended. 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 4.2 cm abscess in the adductor muscles of the proximal right thigh, mildly decreased in size compared with previous study. Diffuse urinary bladder wall thickening, suspicious for cystitis. Suggest correlation with urinalysis. Stable 4.6 cm abdominal aortic aneurysm. Recommend followup by abdomen and pelvis CTA in 6 months, and vascular surgery referral/consultation if not already obtained. This recommendation follows ACR consensus guidelines: White Paper of the ACR Incidental Findings Committee II on Vascular Findings. J Am Coll Radiol 2013; 10:789-794. Electronically Signed   By: Earle Gell M.D.   On: 06/04/2018 17:21   [place] Ekg Site Rite  Result Date: 05/16/2018 If Site Rite image not attached, placement could not be confirmed due to current cardiac rhythm.     Assessment & Plan  1.  Acute cholecystitis     Surgery on consult     Continue with IV antibiotics and broaden them to include gram positives and fungal infections  2.  Right gluteal abscess/osteomyelitis, status post I&D by Dr. Harlow Asa on 7/8    Continue with IV antibiotics     ID consult in a.m., Dr. Johnnye Sima is  following patient    [name] was changed to meropenem for broader coverage for Pseudomonas, vancomycin added, Diflucan added  3.  UTI/cystitis/hematuria     Continue with IV antibiotics     Awaiting culture  4.  History of coronary artery disease, no chest pain at this time     Lipitor will put on hold due to Diflucan  5.  History of abdominal aortic aneurysm  6.  Generalized weakness     Bed rest at this time         DVT Prophylaxis   AM Labs Ordered, also please review Full Orders    Code Status full  Disposition Plan:   Time spent in minutes : 30mnutes  Condition GUARDED   _0 @

## 2018-06-04 NOTE — Progress Notes (Signed)
Pharmacy Antibiotic Note  [name] is a 62 y.o. male admitted on 06/04/2018 with hematuria and suspected CAUTI.  PMH is significant for CVA, recent surgical drainage of gluteal abscess with osteomyelitis (currently on IV Invanz through PICC line), Foley catheter with frequency, pain, burning, cloudy and bloody urine.  Pharmacy has been consulted for Meropenem, vancomycin, and fluconazole dosing.  CT shows acute cholecystitis and small pericholecystic abscess, ongoing decubitus ulcer with probable osteomyelitis, and abscess in the adductor muscles of the proximal right thigh.  Diffuse urinary bladder wall thickening, suspicious for cystitis.  Ertapenem 1g IV q24 - last dose on 8/3 at 8:48 am SCr 0.58 (low, near baseline 0.6) WBC 7.1  Plan: Meropenem 1g IV q8h  Fluconazole 800mg  IV x1 dose, then 400 mg IV q24h Vancomycin 1500 mg IV x1 then 1g IV q8h.  (SCr 0.58, est AUC 406) Check vancomycin levels if remains on vancomycin > 3-4 days.  Goal AUC 400-500. Follow up renal fxn, culture results, and clinical course. Daily SCr F/u ability to de-escalate antibiotics.   Height: 6\' 1"  (185.4 cm) Weight: 195 lb (88.5 kg) IBW/kg (Calculated) : 79.9  No data recorded.  Recent Labs  Lab 06/04/18 1525 06/04/18 1535  WBC 7.1  --   CREATININE 0.58*  --   LATICACIDVEN  --  0.99    Estimated Creatinine Clearance: 108.2 mL/min (A) (by C-G formula based on SCr of 0.58 mg/dL (L)).    No Known Allergies  Antimicrobials this admission: PTA 7/15 Ertapenem >> (planned end date 06/19/18) 8/3 Meropenem >>  8/3 Vancomycin >>  8/3 Fluconazole >>   Dose adjustments this admission:  Microbiology results: 8/3 BCx: 8/3 UCx:  Previous admission 7/8 Abscess Cxt:  multiple organisms present, Moderate citrobacter braakii, moderate viridans strep, moderate bacteroides thetaiotacomicron, few Eikenella corrodens )  Thank you for allowing pharmacy to be a part of this patient's care.  10/3  PharmD, BCPS Pager [phone] 06/04/2018 5:48 PM

## 2018-06-04 NOTE — ED Provider Notes (Signed)
[facility] DEPT Provider Note   CSN: [id number] Arrival date & time: 06/04/18  1433     History   Chief Complaint Chief Complaint  Patient presents with  . Blood in Cath    HPI [name] is a 62 y.o. male.  HPI 62 year old male with complicated medical history including recent surgical drainage of gluteal abscess with associated osteomyelitis, on IV Invanz, here with hematuria.  Patient reports that over the last several days, has had progressive worsening sharp, stabbing, burning pain around his penis.  He has a Foley catheter in place.  He states that over the last 24 hours, he has begun draining cloudy urine with associated pain.  He began having blood in his urine today.  Denies any associated fever, chills, nausea, vomiting, or flank pain.  He has been taking his IV Invanz via right PICC line without any significant complications.  He states his buttocks wound is improving and is not any more painful or malodorous than usual.  Denies any alleviating factors.  Pain is worse whenever he feels like he has a full bladder.  He does feel like his bladder is been more full recently since he began bleeding.  Past Medical History:  Diagnosis Date  . AAA (abdominal aortic aneurysm) ([place])   . AKI (acute kidney injury) ([place])    a. Cr up to 8 in 04/2018.  . Arthritis   . CAD in native artery    a. h/o MI x 3 s/p CABG x 4 (1993 with Servando Snare). b. LHC 01/2017 with severe native disease, 2 grafts occluded.  . Carotid artery disease ([place])    a. s/p R CEA 11/2016.  [name] Chronic combined systolic and diastolic CHF (congestive heart failure) ([place])   . GERD (gastroesophageal reflux disease)   . Hyperkalemia   . ILD (interstitial lung disease) ([place])   . Lung nodule   . MI (myocardial infarction) ([place])    x 3   . Normocytic anemia   . NSVT (nonsustained ventricular tachycardia) ([place])   . Pancreatitis    1/19  . Pneumonia 11/2017  . RA (rheumatoid arthritis)  ([place])   . Stroke ([place])    a. 2018. b. possible recurrence in 04/2018, seen by neuro  . Tobacco abuse     Patient Active Problem List   Diagnosis Date Noted  . Cholecystitis 06/04/2018  . Severe sepsis ([place])   . NSVT (nonsustained ventricular tachycardia) ([place]) 05/14/2018  . Adrenal insufficiency ([place]): Relative 05/11/2018  . Abscess of buttock 05/11/2018  . AKI (acute kidney injury) ([place])   . Urinary retention   . Rheumatoid arthritis ([place])   . Chronic obstructive pulmonary disease ([place])   . Supplemental oxygen dependent   . Tobacco abuse   . AAA (abdominal aortic aneurysm) without rupture ([place])   . History of CVA with residual deficit   . Acute blood loss anemia   . Hyperkalemia   . Cerebral thrombosis with cerebral infarction 04/11/2018  . Sacral ulcer, with unspecified severity ([place]) 04/05/2018  . Pressure injury of skin of sacral region   . Severe comorbid illness   . Acute renal failure ([place])   . Shock ([place]) 04/04/2018  . High anion gap metabolic acidosis 67/34/1937  . Acute kidney injury ([place]) 04/04/2018  . Cervicalgia 01/18/2018  . Chronic pain of both shoulders 01/18/2018  . Hx of rheumatoid arthritis 01/18/2018  . Chronic pain syndrome 01/18/2018  . Leukocytosis 12/15/2017  . Cough   . Pneumonia 11/01/2017  .  Elevated lipase   . Elevated liver enzymes   . Acute pancreatitis 08/08/2017  . Carotid artery disease ([place]) 05/12/2017  . 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 Chronic combined systolic and diastolic CHF (congestive heart failure) ([place]) 03/07/2017  . S/P CABG (coronary artery bypass graft)   . Pure hypercholesterolemia   . Acute combined systolic and diastolic heart failure ([place])   . Exertional shortness of breath   . Solitary pulmonary nodule   . CAD (coronary artery disease) 02/24/2017  . ILD (interstitial lung disease) ([place]) 02/24/2017  . Anemia 02/24/2017  . AAA (abdominal aortic aneurysm) ([place]) 02/24/2017  . Abnormal  EKG 02/24/2017  . Acute respiratory failure with hypoxia ([place]) 02/24/2017  . History of stroke 02/24/2017  . HTN (hypertension) 02/24/2017  . Current smoker 02/24/2017    Past Surgical History:  Procedure Laterality Date  . CARDIAC CATHETERIZATION    . CAROTID ENDARTERECTOMY Left   . Heart Bypass  10/1992  . INCISION AND DRAINAGE PERIRECTAL ABSCESS Right 05/09/2018   Procedure: IRRIGATION AND DEBRIDEMENT  RIGHT BUTTOCK ABSCESS;  Surgeon: Armandina Gemma, MD;  Location: [facility];  Service: General;  Laterality: Right;  . LEFT HEART CATH AND CORS/GRAFTS ANGIOGRAPHY N/A 03/01/2017   Procedure: Left Heart Cath and Cors/Grafts Angiography;  Surgeon: Belva Crome, MD;  Location: [facility];  Service: Cardiovascular;  Laterality: N/A;  . Throat Biopsy     Cat scratch fever        Home Medications    Prior to Admission medications   Medication Sig Start Date End Date Taking? Authorizing Provider  acetaminophen (TYLENOL) 500 MG tablet Take 1,000 mg by mouth every 6 (six) hours as needed for mild pain.    Yes [provider]  albuterol (PROVENTIL) (2.5 MG/3ML) 0.083% nebulizer solution Take 2.5 mg by nebulization every 6 (six) hours as needed for wheezing or shortness of breath.   Yes [provider]  atorvastatin (LIPITOR) 40 MG tablet Take 1 tablet (40 mg total) by mouth daily at 6 PM. Patient taking differently: Take 80 mg by mouth daily at 6 PM.  03/15/17  Yes Dunn, Dayna N, PA-C  carvedilol (COREG) 6.25 MG tablet Take 1 tablet (6.25 mg total) by mouth 2 (two) times daily with a meal. 05/18/18 06/17/18 Yes Eugenie Filler, MD  clopidogrel (PLAVIX) 75 MG tablet Take 1 tablet (75 mg total) by mouth daily. 05/18/18 06/17/18 Yes Eugenie Filler, MD  enoxaparin (LOVENOX) 40 MG/0.4ML injection Inject 0.4 mLs (40 mg total) into the skin daily. 05/19/18  Yes Eugenie Filler, MD  ertapenem [facility]) IVPB Inject 1 g into the vein daily for 34 doses. Indication:  Wound  infection Last Day of Therapy:  06/19/2018 Labs - Once weekly:  CBC/D and BMP, Labs - Every other week:  ESR and CRP 05/18/18 06/21/18 Yes Eugenie Filler, MD  gabapentin (NEURONTIN) 300 MG capsule Take 1 capsule (300 mg total) by mouth every 8 (eight) hours. 01/18/18  Yes Gillis Santa, MD  HYDROcodone-acetaminophen (NORCO) 10-325 MG tablet Take 1 tablet by mouth 3 (three) times daily. 05/18/18  Yes Eugenie Filler, MD  ipratropium-albuterol (DUONEB) 0.5-2.5 (3) MG/3ML SOLN Take 3 mLs by nebulization 2 (two) times daily. 05/18/18  Yes Eugenie Filler, MD  magnesium hydroxide (MILK OF MAGNESIA) 400 MG/5ML suspension Take 30 mLs by mouth daily as needed for mild constipation. And QHS   Yes [provider]  Menthol-Methyl Salicylate (MUSCLE RUB EX) Apply 1 application  topically as needed (for shoulder or knee pain).    Yes [provider]  mirtazapine (REMERON) 7.5 MG tablet Take 7.5 mg by mouth at bedtime.   Yes [provider]  morphine (MSIR) 30 MG tablet Take 1 tablet (30 mg total) by mouth every 4 (four) hours as needed (breakthrough pain). 05/18/18  Yes Eugenie Filler, MD  nicotine (NICODERM CQ - DOSED IN MG/24 HR) 7 mg/24hr patch Place 1 patch (7 mg total) onto the skin daily. 04/13/18  Yes Elodia Florence., MD  nutrition supplement, JUVEN, (JUVEN) PACK Take 1 packet by mouth 2 (two) times daily between meals. 05/18/18  Yes Eugenie Filler, MD  nystatin cream (MYCOSTATIN) Apply topically 2 (two) times daily. 05/18/18  Yes Eugenie Filler, MD  polyethylene glycol [facility] Floria Raveling) packet Take 17 g by mouth 2 (two) times daily. Until having 1 soft BM daily, then can decrease to daily or as needed Patient taking differently: Take 17 g by mouth daily. Until having 1 soft BM daily, then can decrease to daily or as needed 04/12/18  Yes Elodia Florence., MD  potassium chloride SA (K-DUR,KLOR-CON) 20 MEQ tablet Take 2 tablets (40 mEq total) by mouth  daily. Patient taking differently: Take 20 mEq by mouth daily.  05/19/18  Yes Eugenie Filler, MD  predniSONE (DELTASONE) 10 MG tablet Take 10 mg by mouth daily with breakfast.   Yes [provider]  PROVENTIL HFA 108 (90 Base) MCG/ACT inhaler INHALE 2 PUFFS EVERY 6 HOURS AS NEEDED FOR WHEEZE/SHORTNESS OF BREATH 03/29/18  Yes Brand Males, MD  saccharomyces boulardii (FLORASTOR) 250 MG capsule Take 1 capsule (250 mg total) by mouth 2 (two) times daily. 05/18/18  Yes Eugenie Filler, MD  traMADol (ULTRAM) 50 MG tablet Take 2 tablets (100 mg total) by mouth every 6 (six) hours as needed (mild pain if tylenol not effective). 05/18/18  Yes Eugenie Filler, MD  Amino Acids-Protein Hydrolys (FEEDING SUPPLEMENT, PRO-STAT SUGAR FREE 64,) LIQD Take 30 mLs by mouth 2 (two) times daily. Patient not taking: Reported on 06/04/2018 05/18/18   Eugenie Filler, MD  hydroxychloroquine (PLAQUENIL) 200 MG tablet Take 1 tablet (200 mg total) by mouth 2 (two) times daily. Patient not taking: Reported on 06/04/2018 06/22/18   Eugenie Filler, MD  Multiple Vitamin (MULTIVITAMIN WITH MINERALS) TABS tablet Take 1 tablet by mouth daily. Patient not taking: Reported on 06/04/2018 05/19/18   Eugenie Filler, MD    Family History Family History  Problem Relation Age of Onset  . Heart murmur Mother   . Heart disease Mother   . Heart attack Mother   . Suicidality Father   . Heart attack Father   . Stroke Brother   . Heart attack Brother   . Heart attack Maternal Grandmother   . Cancer Maternal Grandmother        unknown type  . Lung cancer Maternal Aunt   . Lung cancer Maternal Uncle   . Cancer Maternal Aunt        unknown cancer  . Heart attack Brother     Social History Social History   Tobacco Use  . Smoking status: Current Every Day Smoker    Packs/day: 0.50    Years: 47.00    Pack years: 23.50    Types: Cigarettes  . Smokeless tobacco: Never Used  . Tobacco comment: half pack  every other day  Substance Use Topics  . Alcohol use: No  Comment: remote history of beer drinking  . Drug use: No     Allergies   Patient has no known allergies.   Review of Systems Review of Systems  Constitutional: Positive for fatigue. Negative for chills and fever.  HENT: Negative for congestion and rhinorrhea.   Eyes: Negative for visual disturbance.  Respiratory: Negative for cough, shortness of breath and wheezing.   Cardiovascular: Negative for chest pain and leg swelling.  Gastrointestinal: Negative for abdominal pain, diarrhea, nausea and vomiting.  Genitourinary: Positive for hematuria and penile pain. Negative for dysuria and flank pain.  Musculoskeletal: Negative for neck pain and neck stiffness.  Skin: Negative for rash and wound.  Allergic/Immunologic: Negative for immunocompromised state.  Neurological: Negative for syncope, weakness and headaches.  All other systems reviewed and are negative.    Physical Exam Updated Vital Signs BP 131/81 (BP Location: Left Arm)   Pulse 88   Temp 97.8 F (36.6 C) (Oral)   Resp 16   Ht 6' 1" (1.854 m)   Wt 88.5 kg (195 lb)   SpO2 97%   BMI 25.73 kg/m   Physical Exam  Constitutional: He is oriented to person, place, and time. He appears well-developed and well-nourished. No distress.  HENT:  Head: Normocephalic and atraumatic.  Eyes: Conjunctivae are normal.  Neck: Neck supple.  Cardiovascular: Normal rate, regular rhythm and normal heart sounds. Exam reveals no friction rub.  No murmur heard. Pulmonary/Chest: Effort normal and breath sounds normal. No respiratory distress. He has no wheezes. He has no rales.  Abdominal: Soft. He exhibits no distension. There is tenderness (Mild, suprapubic).  Genitourinary:  Genitourinary Comments: Large, open wound to right gluteal area extending to bone.  There is intact granulation tissue.  No purulence or foul odor.  Wound dressing in place.  Foley catheter in place, no  obvious contamination.  No erythema of the glans or penile shaft.  Musculoskeletal: He exhibits no edema.  Neurological: He is alert and oriented to person, place, and time. He exhibits normal muscle tone.  Skin: Skin is warm. Capillary refill takes less than 2 seconds.  Psychiatric: He has a normal mood and affect.  Nursing note and vitals reviewed.    ED Treatments / Results  Labs (all labs ordered are listed, but only abnormal results are displayed) Labs Reviewed  CBC WITH DIFFERENTIAL/PLATELET - Abnormal; Notable for the following components:      Result Value   RBC 4.11 (*)    Hemoglobin 11.2 (*)    HCT 36.1 (*)    RDW 16.3 (*)    All other components within normal limits  COMPREHENSIVE METABOLIC PANEL - Abnormal; Notable for the following components:   Potassium 3.4 (*)    Glucose, Bld 105 (*)    Creatinine, Ser 0.58 (*)    Calcium 8.4 (*)    Total Protein 6.1 (*)    Albumin 2.8 (*)    All other components within normal limits  URINALYSIS, ROUTINE W REFLEX MICROSCOPIC - Abnormal; Notable for the following components:   Color, Urine RED (*)    APPearance CLOUDY (*)    Specific Gravity, Urine >1.030 (*)    Glucose, UA 100 (*)    Hgb urine dipstick LARGE (*)    Ketones, ur TRACE (*)    Protein, ur 100 (*)    Nitrite POSITIVE (*)    Leukocytes, UA TRACE (*)    All other components within normal limits  URINALYSIS, MICROSCOPIC (REFLEX) - Abnormal; Notable for the following  components:   Bacteria, UA MANY (*)    All other components within normal limits  CULTURE, BLOOD (ROUTINE X 2)  URINE CULTURE  CULTURE, BLOOD (ROUTINE X 2)  PROTIME-INR  LIPASE, BLOOD  CREATININE, SERUM  I-STAT CG4 LACTIC ACID, ED  I-STAT CG4 LACTIC ACID, ED    EKG None  Radiology Dg Chest 2 View  Result Date: 06/04/2018 CLINICAL DATA:  Bleeding into catheter bag.  Wheezing. EXAM: CHEST - 2 VIEW COMPARISON:  05/09/2018 and 04/06/2018 radiographs.  CT 08/17/2017. FINDINGS: There is stable  cardiomegaly status post median sternotomy and CABG. The upper sternotomy wires are chronically fractured. Right arm PICC extends to the level of the lower SVC. There is extensive chronic interstitial lung disease which appears slightly worse, possibly due to superimposed edema or inflammation. No confluent airspace opacity or significant pleural effusion. Old rib fractures are present on the left. IMPRESSION: Interval increased pulmonary opacities bilaterally, suspicious for edema or inflammation superimposed on chronic interstitial lung disease. Stable cardiomegaly. Electronically Signed   By: Richardean Sale M.D.   On: 06/04/2018 16:11   Ct Abdomen Pelvis W Contrast  Result Date: 06/04/2018 CLINICAL DATA:  Nausea and vomiting. Hematuria. Pyelonephritis. Pelvic decubitus ulcers. Abdominal aortic aneurysm. EXAM: CT ABDOMEN AND PELVIS WITH CONTRAST TECHNIQUE: Multidetector CT imaging of the abdomen and pelvis was performed using the standard protocol following bolus administration of intravenous contrast. CONTRAST:  155m ISOVUE-300 IOPAMIDOL (ISOVUE-300) INJECTION 61% COMPARISON:  Pelvis CT on 05/09/2018 and AP CT on 08/08/2017 FINDINGS: Lower Chest: No acute findings. Stable bibasilar interstitial fibrosis and mild bronchiectasis. Hepatobiliary: No hepatic masses identified. The gallbladder is mildly dilated and demonstrates diffuse nodular wall thickening. There is mild adjacent edema in right hepatic lobe. A small pericholecystic fluid collection is also seen between the gallbladder and right hepatic lobe, with adjacent small defects in the gallbladder wall, consistent with contained perforation. These findings are highly suspicious for acute cholecystitis with pericholecystic abscess. No evidence of biliary ductal dilatation. Pancreas:  No mass or inflammatory changes. Spleen: Within normal limits in size and appearance. Adrenals/Urinary Tract: No masses identified. No evidence of hydronephrosis. A Foley  catheter is seen within urinary bladder. Despite being nearly empty gallbladder wall appears diffusely thickened and suspicious for cystitis. Stomach/Bowel: No evidence of obstruction, inflammatory process or abnormal fluid collections. Normal appendix visualized. Vascular/Lymphatic: No pathologically enlarged lymph nodes. Infrarenal abdominal aortic aneurysm is seen which measures 4.6 cm in maximum diameter, without significant change since previous study. No evidence of aneurysm leak or rupture. Reproductive:  No mass or other significant abnormality. Other:  No evidence of ascites or abscess. Musculoskeletal: A large deep decubitus ulcer is again seen in the right buttock which extends to the right ischial tuberosity. Cortical thinning and irregularity is seen involving the right ischial tuberosity at this site, suspicious for osteomyelitis. A 4.2 cm rim enhancing fluid collection is seen in the adductor muscles of the proximal right thigh. This has decreased in size compared to approximately 5 cm on previous study, and is consistent with abscess. IMPRESSION: Findings consistent with acute cholecystitis and small pericholecystic abscess. Surgical consultation is recommended. 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 4.2 cm abscess in the adductor muscles of the proximal right thigh, mildly decreased in size compared with previous study. Diffuse urinary bladder wall thickening, suspicious for cystitis. Suggest correlation with urinalysis. Stable 4.6 cm abdominal aortic aneurysm. Recommend followup by abdomen and pelvis CTA in 6 months, and vascular surgery  referral/consultation if not already obtained. This recommendation follows ACR consensus guidelines: White Paper of the ACR Incidental Findings Committee II on Vascular Findings. J Am Coll Radiol 2013; 10:789-794. Electronically Signed   By: Earle Gell M.D.   On: 06/04/2018 17:21     Procedures Procedures (including critical care time)  Medications Ordered in ED Medications  lidocaine (XYLOCAINE) 2 % jelly 1 application (1 application Urethral Not Given 06/04/18 1738)  acetaminophen (TYLENOL) tablet 1,000 mg (has no administration in time range)  predniSONE (DELTASONE) tablet 10 mg (has no administration in time range)  gabapentin (NEURONTIN) capsule 300 mg (300 mg Oral Given 06/04/18 2154)  nicotine (NICODERM CQ - dosed in mg/24 hr) patch 7 mg (has no administration in time range)  polyethylene glycol (MIRALAX / GLYCOLAX) packet 17 g (17 g Oral Given 06/04/18 2154)  magnesium hydroxide (MILK OF MAGNESIA) suspension 30 mL (has no administration in time range)  mirtazapine (REMERON) tablet 7.5 mg (7.5 mg Oral Given 06/04/18 2155)  HYDROcodone-acetaminophen (NORCO) 10-325 MG per tablet 1 tablet (1 tablet Oral Given 06/04/18 2154)  carvedilol (COREG) tablet 6.25 mg (has no administration in time range)  clopidogrel (PLAVIX) tablet 75 mg (has no administration in time range)  feeding supplement (PRO-STAT SUGAR FREE 64) liquid 30 mL (30 mLs Oral Given 06/04/18 2154)  multivitamin with minerals tablet 1 tablet (has no administration in time range)  nutrition supplement (JUVEN) (JUVEN) powder packet 1 packet (has no administration in time range)  potassium chloride SA (K-DUR,KLOR-CON) CR tablet 40 mEq (40 mEq Oral Given 06/04/18 2155)  ipratropium-albuterol (DUONEB) 0.5-2.5 (3) MG/3ML nebulizer solution 3 mL (3 mLs Nebulization Given 06/04/18 2137)  saccharomyces boulardii (FLORASTOR) capsule 250 mg (250 mg Oral Given 06/04/18 2154)  enoxaparin (LOVENOX) injection 40 mg (40 mg Subcutaneous Given 06/04/18 2154)  dextrose 5 %-0.45 % sodium chloride infusion ( Intravenous New Bag/Given 06/04/18 2201)  ondansetron (ZOFRAN) tablet 4 mg (has no administration in time range)    Or  ondansetron (ZOFRAN) injection 4 mg (has no administration in time range)  oxyCODONE (Oxy IR/ROXICODONE) immediate  release tablet 5 mg (has no administration in time range)  albuterol (PROVENTIL) (2.5 MG/3ML) 0.083% nebulizer solution 2.5 mg (has no administration in time range)  morphine 2 MG/ML injection 1 mg (1 mg Intravenous Given 06/04/18 2154)  fluconazole (DIFLUCAN) IVPB 400 mg (has no administration in time range)  meropenem (MERREM) 1 g in sodium chloride 0.9 % 100 mL IVPB (has no administration in time range)  vancomycin (VANCOCIN) IVPB 1000 mg/200 mL premix (has no administration in time range)  sodium chloride irrigation 0.9 % 3,000 mL (has no administration in time range)  ipratropium-albuterol (DUONEB) 0.5-2.5 (3) MG/3ML nebulizer solution 3 mL (3 mLs Nebulization Given 06/04/18 1613)  oxyCODONE-acetaminophen (PERCOCET/ROXICET) 5-325 MG per tablet 2 tablet (2 tablets Oral Given 06/04/18 1613)  iopamidol (ISOVUE-300) 61 % injection 100 mL (100 mLs Intravenous Contrast Given 06/04/18 1641)  sodium chloride 0.9 % bolus 500 mL (0 mLs Intravenous Stopped 06/04/18 1935)  meropenem (MERREM) 1 g in sodium chloride 0.9 % 100 mL IVPB (0 g Intravenous Stopped 06/04/18 1933)  vancomycin (VANCOCIN) 1,500 mg in sodium chloride 0.9 % 500 mL IVPB (1,500 mg Intravenous New Bag/Given 06/04/18 1935)  fluconazole (DIFLUCAN) IVPB 800 mg (800 mg Intravenous New Bag/Given 06/04/18 1937)     Initial Impression / Assessment and Plan / ED Course  I have reviewed the triage vital signs and the nursing notes.  Pertinent labs & imaging results that were  available during my care of the patient were reviewed by me and considered in my medical decision making (see chart for details).     62 year old male with recent complex hospitalization and drainage of gluteal abscess with osteomyelitis on IV Invanz here with hematuria and general fatigue.  Patient also with lower abdominal pain and urinary frequency.  He has an indwelling catheter.  Urinalysis shows new pyuria, bacteriuria, as well as positive nitrites.  He has a normal white count  and lactate with no evidence of sepsis.  However, CT imaging obtained and shows a new pericholecystic abscess with inflammation.  He has a continued psoas abscess.  Given his new abscess, with concern for ongoing bacteremia and now complicated UTI that is resistant to Invanz, will broaden back out with coverage for possible enterococcus and gram-positive's, and admit.  Final Clinical Impressions(s) / ED Diagnoses   Final diagnoses:  Complicated UTI (urinary tract infection)    ED Discharge Orders    None       Duffy Bruce, MD 06/04/18 2312

## 2018-06-04 NOTE — ED Triage Notes (Signed)
Pt BIB EMS, from [facility]. Blood noted in cath bag yesterday, worse today, sent for evaluation

## 2018-06-04 NOTE — ED Notes (Signed)
Catheter irrigated w/o difficulty but was unable to aspirate fluid back out.

## 2018-06-04 NOTE — Consult Note (Signed)
Reason for Consult:abnormal imaging Referring Physician: Dr Arther Dames is an 62 y.o. male.  HPI: 62 y.o. M being seen in ED with nephritis and decubitus ulcer.  He is being treated for a psoas abscess as well.  He underwent a CT in ED, which showed some pericholecystic inflammation.  Pt denies any RUQ pain or nausea or decreased appetite.    Past Medical History:  Diagnosis Date  . AAA (abdominal aortic aneurysm) ([place])   . AKI (acute kidney injury) ([place])    a. Cr up to 8 in 04/2018.  . Arthritis   . CAD in native artery    a. h/o MI x 3 s/p CABG x 4 (1993 with Servando Snare). b. LHC 01/2017 with severe native disease, 2 grafts occluded.  . Carotid artery disease ([place])    a. s/p R CEA 11/2016.  [name] Chronic combined systolic and diastolic CHF (congestive heart failure) ([place])   . GERD (gastroesophageal reflux disease)   . Hyperkalemia   . ILD (interstitial lung disease) ([place])   . Lung nodule   . MI (myocardial infarction) ([place])    x 3   . Normocytic anemia   . NSVT (nonsustained ventricular tachycardia) ([place])   . Pancreatitis    1/19  . Pneumonia 11/2017  . RA (rheumatoid arthritis) ([place])   . Stroke ([place])    a. 2018. b. possible recurrence in 04/2018, seen by neuro  . Tobacco abuse     Past Surgical History:  Procedure Laterality Date  . CARDIAC CATHETERIZATION    . CAROTID ENDARTERECTOMY Left   . Heart Bypass  10/1992  . INCISION AND DRAINAGE PERIRECTAL ABSCESS Right 05/09/2018   Procedure: IRRIGATION AND DEBRIDEMENT  RIGHT BUTTOCK ABSCESS;  Surgeon: Armandina Gemma, MD;  Location: [facility];  Service: General;  Laterality: Right;  . LEFT HEART CATH AND CORS/GRAFTS ANGIOGRAPHY N/A 03/01/2017   Procedure: Left Heart Cath and Cors/Grafts Angiography;  Surgeon: Belva Crome, MD;  Location: [facility];  Service: Cardiovascular;  Laterality: N/A;  . Throat Biopsy     Cat scratch fever    Family History  Problem Relation Age of Onset  . Heart murmur Mother   . Heart disease  Mother   . Heart attack Mother   . Suicidality Father   . Heart attack Father   . Stroke Brother   . Heart attack Brother   . Heart attack Maternal Grandmother   . Cancer Maternal Grandmother        unknown type  . Lung cancer Maternal Aunt   . Lung cancer Maternal Uncle   . Cancer Maternal Aunt        unknown cancer  . Heart attack Brother     Social History:  reports that he has been smoking cigarettes.  He has a 23.50 pack-year smoking history. He has never used smokeless tobacco. He reports that he does not drink alcohol or use drugs.  Allergies: No Known Allergies  Medications: I have reviewed the patient's current medications.  Results for orders placed or performed during the hospital encounter of 06/04/18 (from the past 48 hour(s))  CBC with Differential     Status: Abnormal   Collection Time: 06/04/18  3:25 PM  Result Value Ref Range   WBC 7.1 4.0 - 10.5 K/uL   RBC 4.11 (L) 4.22 - 5.81 MIL/uL   Hemoglobin 11.2 (L) 13.0 - 17.0 g/dL   HCT 36.1 (L) 39.0 - 52.0 %   MCV 87.8 78.0 -  100.0 fL   MCH 27.3 26.0 - 34.0 pg   MCHC 31.0 30.0 - 36.0 g/dL   RDW 16.3 (H) 11.5 - 15.5 %   Platelets 248 150 - 400 K/uL   Neutrophils Relative % 46 %   Neutro Abs 3.3 1.7 - 7.7 K/uL   Lymphocytes Relative 42 %   Lymphs Abs 3.0 0.7 - 4.0 K/uL   Monocytes Relative 9 %   Monocytes Absolute 0.6 0.1 - 1.0 K/uL   Eosinophils Relative 3 %   Eosinophils Absolute 0.2 0.0 - 0.7 K/uL   Basophils Relative 0 %   Basophils Absolute 0.0 0.0 - 0.1 K/uL    Comment: Performed at [facility], [place] [address] [place], [place] [address]  Comprehensive metabolic panel     Status: Abnormal   Collection Time: 06/04/18  3:25 PM  Result Value Ref Range   Sodium 140 135 - 145 mmol/L   Potassium 3.4 (L) 3.5 - 5.1 mmol/L   Chloride 106 98 - 111 mmol/L   CO2 26 22 - 32 mmol/L   Glucose, Bld 105 (H) 70 - 99 mg/dL   BUN 14 8 - 23 mg/dL   Creatinine, Ser 0.58 (L) 0.61 - 1.24 mg/dL    Calcium 8.4 (L) 8.9 - 10.3 mg/dL   Total Protein 6.1 (L) 6.5 - 8.1 g/dL   Albumin 2.8 (L) 3.5 - 5.0 g/dL   AST 15 15 - 41 U/L   ALT 15 0 - 44 U/L   Alkaline Phosphatase 94 38 - 126 U/L   Total Bilirubin 1.1 0.3 - 1.2 mg/dL   GFR calc non Af Amer >60 >60 mL/min   GFR calc Af Amer >60 >60 mL/min    Comment: (NOTE) The eGFR has been calculated using the CKD EPI equation. This calculation has not been validated in all clinical situations. eGFR's persistently <60 mL/min signify possible Chronic Kidney Disease.    Anion gap 8 5 - 15    Comment: Performed at [facility], [place] [address], [place], [place] [address]  Protime-INR     Status: None   Collection Time: 06/04/18  3:25 PM  Result Value Ref Range   Prothrombin Time 13.1 11.4 - 15.2 seconds   INR 1.00     Comment: Performed at [facility], [place] [address], [place], [place] [address]  Urinalysis, Routine w reflex microscopic     Status: Abnormal   Collection Time: 06/04/18  3:25 PM  Result Value Ref Range   Color, Urine RED (A) YELLOW    Comment: RED BIOCHEMICALS MAY BE AFFECTED BY COLOR    APPearance CLOUDY (A) CLEAR    Comment: TURBID   Specific Gravity, Urine >1.030 (H) 1.005 - 1.030   pH 6.5 5.0 - 8.0   Glucose, UA 100 (A) NEGATIVE mg/dL   Hgb urine dipstick LARGE (A) NEGATIVE   Bilirubin Urine NEGATIVE NEGATIVE   Ketones, ur TRACE (A) NEGATIVE mg/dL   Protein, ur 100 (A) NEGATIVE mg/dL   Nitrite POSITIVE (A) NEGATIVE   Leukocytes, UA TRACE (A) NEGATIVE    Comment: Performed at [facility], [place] [address], [place], [place] [address]  Urinalysis, Microscopic (reflex)     Status: Abnormal   Collection Time: 06/04/18  3:25 PM  Result Value Ref Range   RBC / HPF 21-50 0 - 5 RBC/hpf   WBC, UA 21-50 0 - 5 WBC/hpf   Bacteria, UA MANY (A) NONE SEEN   Squamous Epithelial /  LPF 0-5 0 - 5    Comment: Performed at [facility], [place] [address]  [place], [place] [address]  I-Stat CG4 Lactic Acid, ED     Status: None   Collection Time: 06/04/18  3:35 PM  Result Value Ref Range   Lactic Acid, Venous 0.99 0.5 - 1.9 mmol/L  I-Stat CG4 Lactic Acid, ED     Status: None   Collection Time: 06/04/18  5:45 PM  Result Value Ref Range   Lactic Acid, Venous 0.81 0.5 - 1.9 mmol/L  Lipase, blood     Status: None   Collection Time: 06/04/18  6:09 PM  Result Value Ref Range   Lipase 32 11 - 51 U/L    Comment: Performed at [facility], [place] [address] [place], [place] [address]    Dg Chest 2 View  Result Date: 06/04/2018 CLINICAL DATA:  Bleeding into catheter bag.  Wheezing. EXAM: CHEST - 2 VIEW COMPARISON:  05/09/2018 and 04/06/2018 radiographs.  CT 08/17/2017. FINDINGS: There is stable cardiomegaly status post median sternotomy and CABG. The upper sternotomy wires are chronically fractured. Right arm PICC extends to the level of the lower SVC. There is extensive chronic interstitial lung disease which appears slightly worse, possibly due to superimposed edema or inflammation. No confluent airspace opacity or significant pleural effusion. Old rib fractures are present on the left. IMPRESSION: Interval increased pulmonary opacities bilaterally, suspicious for edema or inflammation superimposed on chronic interstitial lung disease. Stable cardiomegaly. Electronically Signed   By: Richardean Sale M.D.   On: 06/04/2018 16:11   Ct Abdomen Pelvis W Contrast  Result Date: 06/04/2018 CLINICAL DATA:  Nausea and vomiting. Hematuria. Pyelonephritis. Pelvic decubitus ulcers. Abdominal aortic aneurysm. EXAM: CT ABDOMEN AND PELVIS WITH CONTRAST TECHNIQUE: Multidetector CT imaging of the abdomen and pelvis was performed using the standard protocol following bolus administration of intravenous contrast. CONTRAST:  127m ISOVUE-300 IOPAMIDOL (ISOVUE-300) INJECTION 61% COMPARISON:  Pelvis CT on 05/09/2018 and AP CT on 08/08/2017 FINDINGS: Lower Chest: No  acute findings. Stable bibasilar interstitial fibrosis and mild bronchiectasis. Hepatobiliary: No hepatic masses identified. The gallbladder is mildly dilated and demonstrates diffuse nodular wall thickening. There is mild adjacent edema in right hepatic lobe. A small pericholecystic fluid collection is also seen between the gallbladder and right hepatic lobe, with adjacent small defects in the gallbladder wall, consistent with contained perforation. These findings are highly suspicious for acute cholecystitis with pericholecystic abscess. No evidence of biliary ductal dilatation. Pancreas:  No mass or inflammatory changes. Spleen: Within normal limits in size and appearance. Adrenals/Urinary Tract: No masses identified. No evidence of hydronephrosis. A Foley catheter is seen within urinary bladder. Despite being nearly empty gallbladder wall appears diffusely thickened and suspicious for cystitis. Stomach/Bowel: No evidence of obstruction, inflammatory process or abnormal fluid collections. Normal appendix visualized. Vascular/Lymphatic: No pathologically enlarged lymph nodes. Infrarenal abdominal aortic aneurysm is seen which measures 4.6 cm in maximum diameter, without significant change since previous study. No evidence of aneurysm leak or rupture. Reproductive:  No mass or other significant abnormality. Other:  No evidence of ascites or abscess. Musculoskeletal: A large deep decubitus ulcer is again seen in the right buttock which extends to the right ischial tuberosity. Cortical thinning and irregularity is seen involving the right ischial tuberosity at this site, suspicious for osteomyelitis. A 4.2 cm rim enhancing fluid collection is seen in the adductor muscles of the proximal right thigh. This has decreased in size compared to approximately 5 cm on previous study,  and is consistent with abscess. IMPRESSION: Findings consistent with acute cholecystitis and small pericholecystic abscess. Surgical  consultation is recommended. 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 4.2 cm abscess in the adductor muscles of the proximal right thigh, mildly decreased in size compared with previous study. Diffuse urinary bladder wall thickening, suspicious for cystitis. Suggest correlation with urinalysis. Stable 4.6 cm abdominal aortic aneurysm. Recommend followup by abdomen and pelvis CTA in 6 months, and vascular surgery referral/consultation if not already obtained. This recommendation follows ACR consensus guidelines: White Paper of the ACR Incidental Findings Committee II on Vascular Findings. J Am Coll Radiol 2013; 10:789-794. Electronically Signed   By: Earle Gell M.D.   On: 06/04/2018 17:21    Review of Systems  Constitutional: Negative for chills and fever.  Eyes: Negative for blurred vision and double vision.  Respiratory: Negative for cough and shortness of breath.   Cardiovascular: Negative for chest pain and palpitations.  Gastrointestinal: Negative for abdominal pain, nausea and vomiting.  Skin: Negative for itching and rash.  Neurological: Negative for dizziness and headaches.   Blood pressure 129/80, pulse 85, resp. rate 18, height _0  (1.854 m), weight 88.5 kg (195 lb), SpO2 92 %. Physical Exam  Constitutional: He is oriented to person, place, and time. He appears well-developed. No distress.  HENT:  Head: Normocephalic and atraumatic.  Eyes: Pupils are equal, round, and reactive to light. Conjunctivae are normal.  Neck: Normal range of motion. Neck supple.  Cardiovascular: Normal rate and regular rhythm.  Respiratory: Effort normal. No respiratory distress.  GI: Soft. There is no tenderness. There is no rebound and no guarding.  Musculoskeletal: Normal range of motion.  Neurological: He is alert and oriented to person, place, and time.  Skin: Skin is warm and dry.    Assessment/Plan: 62 y.o. M with multiple  medical problems.  CT in Ed concerning for cholecystitis.  Pt is completely asymptomatic and has a neg physical exam.  WBC and LFT's are normal.  No indication for further work up.  If patient becomes symptomatic (RUQ or mid-epigastric pain, nausea or vomiting), please call us.  Reina Wilton C. 12/05/4686, 7:37 PM

## 2018-06-04 NOTE — ED Notes (Signed)
ED TO INPATIENT HANDOFF REPORT  Name/Age/Gender [name] 62 y.o. male  Code Status Code Status History    Date Active Date Inactive Code Status Order ID Comments User Context   05/09/2018 2157 05/18/2018 1929 Full Code [id number]  Gerkin, Todd, MD Inpatient   05/09/2018 2157 05/09/2018 2157 Full Code [id number]  McQuaid, Douglas B, MD Inpatient   04/04/2018 2241 04/13/2018 0030 Full Code [id number]  Desai, Rahul P, PA-C ED   11/01/2017 1656 11/03/2017 1527 Full Code [id number]  Vann, Jessica U, DO ED   08/09/2017 0041 08/11/2017 1825 Full Code [id number]  Gonfa, Taye T, MD Inpatient   02/24/2017 2348 03/02/2017 1358 Full Code [id number]  Opyd, Timothy S, MD ED    Advance Directive Documentation     Most Recent Value  Type of Advance Directive  Healthcare Power of Attorney  Pre-existing out of facility DNR order (yellow form or pink MOST form)  -  "MOST" Form in Place?  -      Home/SNF/Other Home  Chief Complaint blood in catheter  Level of Care/Admitting Diagnosis ED Disposition    ED Disposition Condition Comment   Ad[facility][id number]  Level of Care: Med-Surg [16]  Diagnosis: Cholecystitis [192631]  Admitting Physician: HIJAZI, ALI [[id number]]  Attending Physician: HIJAZI, ALI [[id number]]  Estimated length of stay: past midnight tomorrow  Certification:: I certify this patient will need inpatient services for at least 2 midnights  PT Class (Do Not Modify): Inpatient [101]  PT Acc Code (Do Not Modify): Private [1]       Medical History Past Medical History:  Diagnosis Date  . AAA (abdominal aortic aneurysm) (HCC)   . AKI (acute kidney injury) (HCC)    a. Cr up to 8 in 04/2018.  . Arthritis   . CAD in native artery    a. h/o MI x 3 s/p CABG x 4 (1993 with [facility]). b. LHC 01/2017 with severe native disease, 2 grafts occluded.  . Carotid artery disease (HCC)    a. s/p R CEA 11/2016.  . Chronic combined systolic and diastolic CHF (congestive heart  failure) (HCC)   . GERD (gastroesophageal reflux disease)   . Hyperkalemia   . ILD (interstitial lung disease) (HCC)   . Lung nodule   . MI (myocardial infarction) (HCC)    x 3   . Normocytic anemia   . NSVT (nonsustained ventricular tachycardia) (HCC)   . Pancreatitis    1/19  . Pneumonia 11/2017  . RA (rheumatoid arthritis) (HCC)   . Stroke (HCC)    a. 2018. b. possible recurrence in 04/2018, seen by neuro  . Tobacco abuse     Allergies No Known Allergies  IV Location/Drains/Wounds Patient Lines/Drains/Airways Status   Active Line/Drains/Airways    Name:   Placement date:   Placement time:   Site:   Days:   PICC Single Lumen 05/16/18 PICC Right Brachial 39 cm 0 cm   05/16/18    2132    Brachial   19   External Urinary Catheter   04/09/18    2317    -   56   Incision (Closed) 05/09/18 Buttocks Right   05/09/18    2053     26   Pressure Injury 04/05/18 Stage II -  Partial thickness loss of dermis presenting as a shallow open ulcer with a red, pink wound bed without slough.   04/05/18    0030     60     Pressure Injury 04/05/18 Unstageable - Full thickness tissue loss in which the base of the ulcer is covered by slough (yellow, tan, gray, green or brown) and/or eschar (tan, brown or black) in the wound bed.   04/05/18    0030     60   Pressure Injury 04/07/18 Stage II -  Partial thickness loss of dermis presenting as a shallow open ulcer with a red, pink wound bed without slough.   04/07/18    1719     58          Labs/Imaging Results for orders placed or performed during the hospital encounter of 06/04/18 (from the past 48 hour(s))  CBC with Differential     Status: Abnormal   Collection Time: 06/04/18  3:25 PM  Result Value Ref Range   WBC 7.1 4.0 - 10.5 K/uL   RBC 4.11 (L) 4.22 - 5.81 MIL/uL   Hemoglobin 11.2 (L) 13.0 - 17.0 g/dL   HCT 36.1 (L) 39.0 - 52.0 %   MCV 87.8 78.0 - 100.0 fL   MCH 27.3 26.0 - 34.0 pg   MCHC 31.0 30.0 - 36.0 g/dL   RDW 16.3 (H) 11.5 - 15.5 %    Platelets 248 150 - 400 K/uL   Neutrophils Relative % 46 %   Neutro Abs 3.3 1.7 - 7.7 K/uL   Lymphocytes Relative 42 %   Lymphs Abs 3.0 0.7 - 4.0 K/uL   Monocytes Relative 9 %   Monocytes Absolute 0.6 0.1 - 1.0 K/uL   Eosinophils Relative 3 %   Eosinophils Absolute 0.2 0.0 - 0.7 K/uL   Basophils Relative 0 %   Basophils Absolute 0.0 0.0 - 0.1 K/uL    Comment: Performed at [facility], [place] [address], [place], [place] [address]  Comprehensive metabolic panel     Status: Abnormal   Collection Time: 06/04/18  3:25 PM  Result Value Ref Range   Sodium 140 135 - 145 mmol/L   Potassium 3.4 (L) 3.5 - 5.1 mmol/L   Chloride 106 98 - 111 mmol/L   CO2 26 22 - 32 mmol/L   Glucose, Bld 105 (H) 70 - 99 mg/dL   BUN 14 8 - 23 mg/dL   Creatinine, Ser 0.58 (L) 0.61 - 1.24 mg/dL   Calcium 8.4 (L) 8.9 - 10.3 mg/dL   Total Protein 6.1 (L) 6.5 - 8.1 g/dL   Albumin 2.8 (L) 3.5 - 5.0 g/dL   AST 15 15 - 41 U/L   ALT 15 0 - 44 U/L   Alkaline Phosphatase 94 38 - 126 U/L   Total Bilirubin 1.1 0.3 - 1.2 mg/dL   GFR calc non Af Amer >60 >60 mL/min   GFR calc Af Amer >60 >60 mL/min    Comment: (NOTE) The eGFR has been calculated using the CKD EPI equation. This calculation has not been validated in all clinical situations. eGFR's persistently <60 mL/min signify possible Chronic Kidney Disease.    Anion gap 8 5 - 15    Comment: Performed at [facility], [place] [address], [place], [place] [address]  Protime-INR     Status: None   Collection Time: 06/04/18  3:25 PM  Result Value Ref Range   Prothrombin Time 13.1 11.4 - 15.2 seconds   INR 1.00     Comment: Performed at [facility], [place] [address] [place], [place] [address]  Urinalysis, Routine w reflex microscopic     Status: Abnormal   Collection  Time: 06/04/18  3:25 PM  Result Value Ref Range   Color, Urine RED (A) YELLOW    Comment: RED BIOCHEMICALS MAY BE AFFECTED BY COLOR    APPearance  CLOUDY (A) CLEAR    Comment: TURBID   Specific Gravity, Urine >1.030 (H) 1.005 - 1.030   pH 6.5 5.0 - 8.0   Glucose, UA 100 (A) NEGATIVE mg/dL   Hgb urine dipstick LARGE (A) NEGATIVE   Bilirubin Urine NEGATIVE NEGATIVE   Ketones, ur TRACE (A) NEGATIVE mg/dL   Protein, ur 100 (A) NEGATIVE mg/dL   Nitrite POSITIVE (A) NEGATIVE   Leukocytes, UA TRACE (A) NEGATIVE    Comment: Performed at [facility] [place] [address], [place], [place] [address]  Urinalysis, Microscopic (reflex)     Status: Abnormal   Collection Time: 06/04/18  3:25 PM  Result Value Ref Range   RBC / HPF 21-50 0 - 5 RBC/hpf   WBC, UA 21-50 0 - 5 WBC/hpf   Bacteria, UA MANY (A) NONE SEEN   Squamous Epithelial / LPF 0-5 0 - 5    Comment: Performed at [facility], [place] [address], [place], [place] [address]  I-Stat CG4 Lactic Acid, ED     Status: None   Collection Time: 06/04/18  3:35 PM  Result Value Ref Range   Lactic Acid, Venous 0.99 0.5 - 1.9 mmol/L  I-Stat CG4 Lactic Acid, ED     Status: None   Collection Time: 06/04/18  5:45 PM  Result Value Ref Range   Lactic Acid, Venous 0.81 0.5 - 1.9 mmol/L  Lipase, blood     Status: None   Collection Time: 06/04/18  6:09 PM  Result Value Ref Range   Lipase 32 11 - 51 U/L    Comment: Performed at [facility], [place] [address], [place], [place] [address]   Dg Chest 2 View  Result Date: 06/04/2018 CLINICAL DATA:  Bleeding into catheter bag.  Wheezing. EXAM: CHEST - 2 VIEW COMPARISON:  05/09/2018 and 04/06/2018 radiographs.  CT 08/17/2017. FINDINGS: There is stable cardiomegaly status post median sternotomy and CABG. The upper sternotomy wires are chronically fractured. Right arm PICC extends to the level of the lower SVC. There is extensive chronic interstitial lung disease which appears slightly worse, possibly due to superimposed edema or inflammation. No confluent airspace opacity or significant pleural effusion. Old rib  fractures are present on the left. IMPRESSION: Interval increased pulmonary opacities bilaterally, suspicious for edema or inflammation superimposed on chronic interstitial lung disease. Stable cardiomegaly. Electronically Signed   By: Richardean Sale M.D.   On: 06/04/2018 16:11   Ct Abdomen Pelvis W Contrast  Result Date: 06/04/2018 CLINICAL DATA:  Nausea and vomiting. Hematuria. Pyelonephritis. Pelvic decubitus ulcers. Abdominal aortic aneurysm. EXAM: CT ABDOMEN AND PELVIS WITH CONTRAST TECHNIQUE: Multidetector CT imaging of the abdomen and pelvis was performed using the standard protocol following bolus administration of intravenous contrast. CONTRAST:  126m ISOVUE-300 IOPAMIDOL (ISOVUE-300) INJECTION 61% COMPARISON:  Pelvis CT on 05/09/2018 and AP CT on 08/08/2017 FINDINGS: Lower Chest: No acute findings. Stable bibasilar interstitial fibrosis and mild bronchiectasis. Hepatobiliary: No hepatic masses identified. The gallbladder is mildly dilated and demonstrates diffuse nodular wall thickening. There is mild adjacent edema in right hepatic lobe. A small pericholecystic fluid collection is also seen between the gallbladder and right hepatic lobe, with adjacent small defects in the gallbladder wall, consistent with contained perforation. These findings are highly suspicious for acute cholecystitis with pericholecystic abscess. No evidence  of biliary ductal dilatation. Pancreas:  No mass or inflammatory changes. Spleen: Within normal limits in size and appearance. Adrenals/Urinary Tract: No masses identified. No evidence of hydronephrosis. A Foley catheter is seen within urinary bladder. Despite being nearly empty gallbladder wall appears diffusely thickened and suspicious for cystitis. Stomach/Bowel: No evidence of obstruction, inflammatory process or abnormal fluid collections. Normal appendix visualized. Vascular/Lymphatic: No pathologically enlarged lymph nodes. Infrarenal abdominal aortic aneurysm is seen  which measures 4.6 cm in maximum diameter, without significant change since previous study. No evidence of aneurysm leak or rupture. Reproductive:  No mass or other significant abnormality. Other:  No evidence of ascites or abscess. Musculoskeletal: A large deep decubitus ulcer is again seen in the right buttock which extends to the right ischial tuberosity. Cortical thinning and irregularity is seen involving the right ischial tuberosity at this site, suspicious for osteomyelitis. A 4.2 cm rim enhancing fluid collection is seen in the adductor muscles of the proximal right thigh. This has decreased in size compared to approximately 5 cm on previous study, and is consistent with abscess. IMPRESSION: Findings consistent with acute cholecystitis and small pericholecystic abscess. Surgical consultation is recommended. 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 4.2 cm abscess in the adductor muscles of the proximal right thigh, mildly decreased in size compared with previous study. Diffuse urinary bladder wall thickening, suspicious for cystitis. Suggest correlation with urinalysis. Stable 4.6 cm abdominal aortic aneurysm. Recommend followup by abdomen and pelvis CTA in 6 months, and vascular surgery referral/consultation if not already obtained. This recommendation follows ACR consensus guidelines: White Paper of the ACR Incidental Findings Committee II on Vascular Findings. J Am Coll Radiol 2013; 10:789-794. Electronically Signed   By: John  Stahl M.D.   On: 06/04/2018 17:21    Pending Labs Unresulted Labs (From admission, onward)   Start     Ordered   06/05/18 0500  Creatinine, serum  Daily,   R     06/04/18 1930   06/04/18 1734  Blood culture (routine x 2)  BLOOD CULTURE X 2,   STAT     06/04/18 1733   06/04/18 1507  Urine culture  STAT,   STAT    Question:  Patient immune status  Answer:  Normal   06/04/18 1506   Signed and Held   Creatinine, serum  (enoxaparin (LOVENOX)    CrCl >/= 30 ml/min)  Weekly,   R    Comments:  while on enoxaparin therapy    Signed and Held      Vitals/Pain Today's Vitals   06/04/18 1451 06/04/18 1452 06/04/18 1743 06/04/18 1918  BP:    129/80  Pulse:    85  Resp:    18  SpO2: 95%   92%  Weight:  195 lb (88.5 kg)    Height:  6' 1" (1.854 m)    PainSc: 0-No pain  5      Isolation Precautions No active isolations  Medications Medications  lidocaine (XYLOCAINE) 2 % jelly 1 application (1 application Urethral Not Given 06/04/18 1738)  vancomycin (VANCOCIN) 1,500 mg in sodium chloride 0.9 % 500 mL IVPB (1,500 mg Intravenous New Bag/Given 06/04/18 1935)  fluconazole (DIFLUCAN) IVPB 800 mg (800 mg Intravenous New Bag/Given 06/04/18 1937)  ipratropium-albuterol (DUONEB) 0.5-2.5 (3) MG/3ML nebulizer solution 3 mL (3 mLs Nebulization Given 06/04/18 1613)  oxyCODONE-acetaminophen (PERCOCET/ROXICET) 5-325 MG per tablet 2 tablet (2 tablets Oral Given 06/04/18 1613)  iopamidol (ISOVUE-300) 61 % injection 100 mL (  100 mLs Intravenous Contrast Given 06/04/18 1641)  sodium chloride 0.9 % bolus 500 mL (0 mLs Intravenous Stopped 06/04/18 1935)  meropenem (MERREM) 1 g in sodium chloride 0.9 % 100 mL IVPB (0 g Intravenous Stopped 06/04/18 1933)    Mobility walks with person assist 

## 2018-06-05 DIAGNOSIS — N390 Urinary tract infection, site not specified: Secondary | ICD-10-CM

## 2018-06-05 LAB — CREATININE, SERUM: CREATININE: 0.57 mg/dL — AB (ref 0.61–1.24)

## 2018-06-05 LAB — URINE CULTURE
CULTURE: NO GROWTH
Special Requests: NORMAL

## 2018-06-05 NOTE — Progress Notes (Signed)
Patient ID: [name], male   DOB: 11/15/1955, 62 y.o.   MRN: [id number]                                                                PROGRESS NOTE                                                                                                                                              XCOVER                                                               Patient Demographics:    [name], is a 62 y.o. male, DOB - 1955/11/08, TGY:[id number]  Admit date - 06/04/2018   Admitting Physician Carron Curie, MD  Outpatient Primary MD for the patient is [name], PA-C  LOS - 1  Outpatient Specialists:     Chief Complaint  Patient presents with  . Blood in Cath       Brief Narrative 62 y.o. male, who has been recently admitted on 05/09/2018 for gluteal abscess and osteomyelitis on the right, status post I&D by Dr. Gerrit Friends, status post treatment with vancomycin  clindamycin and Zosyn and later on transitioned to ertapenem which he is still on, presenting with dysuria, hematuria and penile pain.  No history of fever chills nausea or vomiting .  CT of abdomen was consistent with acute cholecystitis and small pericholecystic abscess.  4.2 cm abscess in the abductor muscles of the proximal right thigh was noted , mildly decreased compared to the previous study.  He was also noted to have findings of cystitis and a stable 4.6 cm abdominal aortic aneurysm.  [name] was switched to meropenem to cover for resistant Pseudomonas and vancomycin and Diflucan were started.  Patient will be admitted to MedSurg and surgery were consulted for the acute cholecystitis and the abscess noted in the proximal thigh.  Patient has a history of coronary artery disease status post CABG and interstitial lung disease with history of peripheral vascular disease status post carotid endarterectomy and history of pancreatitis      Subjective:   Called to see patient as [name] today is feeling  better.  Pt has not eaten.  Denies n/v, abd pain, diarrhea, brbpr, black stool  No headache, No chest pain,  No new weakness tingling or numbness, No Cough - SOB.    Assessment  &  Plan :    Active Problems:   Cholecystitis   ? Cholecystitis Surgery evaluated the patient and didn't think that he had cholecystitis Advance diet to liquid diet  R gluteal abscess/ osteomelitis s/p I and D by Dr. Gerrit Friends ID consult per H and P I don't see any note from ID, please consult in AM.  Cont Vanco Cont Meropenem Cont Diflucan  UTI/ cystitis Awaiting urine culture Cont iv abx  CAD/Hyperlipidemia No Atorvastatin, while on diflucan  AAA 4.6cm    Code Status :  FULL CODE  Family Communication  : w patient  Disposition Plan  : home  Barriers For Discharge :   Consults  :  Surgery, please calll ID in AM per H/P  Procedures  :     DVT Prophylaxis  :  Lovenox - - SCDs  Lab Results  Component Value Date   PLT 248 06/04/2018    Antibiotics  :  Vanco/ meropenem/ diflucan 8/3=>  Anti-infectives (From admission, onward)   Start     Dose/Rate Route Frequency Ordered Stop   06/05/18 1800  fluconazole (DIFLUCAN) IVPB 400 mg     400 mg 100 mL/hr over 120 Minutes Intravenous Every 24 hours 06/04/18 2121     06/05/18 0400  vancomycin (VANCOCIN) IVPB 1000 mg/200 mL premix     1,000 mg 200 mL/hr over 60 Minutes Intravenous Every 8 hours 06/04/18 2121     06/05/18 0000  meropenem (MERREM) 1 g in sodium chloride 0.9 % 100 mL IVPB     1 g 200 mL/hr over 30 Minutes Intravenous Every 8 hours 06/04/18 2121     06/04/18 1845  fluconazole (DIFLUCAN) IVPB 800 mg     800 mg 200 mL/hr over 120 Minutes Intravenous  Once 06/04/18 1831 06/04/18 2137   06/04/18 1800  meropenem (MERREM) 1 g in sodium chloride 0.9 % 100 mL IVPB     1 g 200 mL/hr over 30 Minutes Intravenous STAT 06/04/18 1747 06/04/18 1933   06/04/18 1800  vancomycin (VANCOCIN) 1,500 mg in sodium chloride 0.9 % 500 mL IVPB      1,500 mg 250 mL/hr over 120 Minutes Intravenous STAT 06/04/18 1747 06/04/18 2135        Objective:   Vitals:   06/04/18 2138 06/05/18 0516 06/05/18 0816 06/05/18 1420  BP:  125/87  136/84  Pulse:  96  89  Resp:  16  14  Temp:  97.7 F (36.5 C)  97.6 F (36.4 C)  TempSrc:  Oral  Oral  SpO2: 97% 96% 97% 97%  Weight:      Height:        Wt Readings from Last 3 Encounters:  06/04/18 195 lb (88.5 kg)  05/16/18 195 lb 12.3 oz (88.8 kg)  04/29/18 199 lb 6.4 oz (90.4 kg)     Intake/Output Summary (Last 24 hours) at 06/05/2018 1825 Last data filed at 06/05/2018 1800 Gross per 24 hour  Intake 2263.84 ml  Output 4251 ml  Net -1987.16 ml     Physical Exam  Awake Alert, Oriented X 3, No new F.N deficits, Normal affect Industry.AT,PERRAL Supple Neck,No JVD, No cervical lymphadenopathy appriciated.  Symmetrical Chest wall movement, Good air movement bilaterally, CTAB RRR,No Gallops,Rubs or new Murmurs, No Parasternal Heave +ve B.Sounds, Abd Soft, No tenderness, No organomegaly appriciated, No rebound - guarding or rigidity. No Cyanosis, Clubbing or edema, No new Rash or bruise     Data Review:    CBC Recent Labs  Lab 06/04/18  1525  WBC 7.1  HGB 11.2*  HCT 36.1*  PLT 248  MCV 87.8  MCH 27.3  MCHC 31.0  RDW 16.3*  LYMPHSABS 3.0  MONOABS 0.6  EOSABS 0.2  BASOSABS 0.0    Chemistries  Recent Labs  Lab 06/04/18 1525 06/05/18 0516  NA 140  --   K 3.4*  --   CL 106  --   CO2 26  --   GLUCOSE 105*  --   BUN 14  --   CREATININE 0.58* 0.57*  CALCIUM 8.4*  --   AST 15  --   ALT 15  --   ALKPHOS 94  --   BILITOT 1.1  --    ------------------------------------------------------------------------------------------------------------------ No results for input(s): CHOL, HDL, LDLCALC, TRIG, CHOLHDL, LDLDIRECT in the last 72 hours.  Lab Results  Component Value Date   HGBA1C 5.2 04/11/2018    ------------------------------------------------------------------------------------------------------------------ No results for input(s): TSH, T4TOTAL, T3FREE, THYROIDAB in the last 72 hours.  Invalid input(s): FREET3 ------------------------------------------------------------------------------------------------------------------ No results for input(s): VITAMINB12, FOLATE, FERRITIN, TIBC, IRON, RETICCTPCT in the last 72 hours.  Coagulation profile Recent Labs  Lab 06/04/18 1525  INR 1.00    No results for input(s): DDIMER in the last 72 hours.  Cardiac Enzymes No results for input(s): CKMB, TROPONINI, MYOGLOBIN in the last 168 hours.  Invalid input(s): CK ------------------------------------------------------------------------------------------------------------------    Component Value Date/Time   BNP 232.1 (H) 05/09/2018 1332    Inpatient Medications  Scheduled Meds: . carvedilol  6.25 mg Oral BID WC  . clopidogrel  75 mg Oral Daily  . enoxaparin (LOVENOX) injection  40 mg Subcutaneous Q24H  . feeding supplement (PRO-STAT SUGAR FREE 64)  30 mL Oral BID  . gabapentin  300 mg Oral Q8H  . HYDROcodone-acetaminophen  1 tablet Oral Q8H  . ipratropium-albuterol  3 mL Nebulization BID  . lidocaine  1 application Urethral Once  . mirtazapine  7.5 mg Oral QHS  . multivitamin with minerals  1 tablet Oral Daily  . nicotine  7 mg Transdermal Daily  . nutrition supplement (JUVEN)  1 packet Oral BID BM  . polyethylene glycol  17 g Oral BID  . potassium chloride SA  40 mEq Oral Daily  . predniSONE  10 mg Oral Q breakfast  . saccharomyces boulardii  250 mg Oral BID   Continuous Infusions: . dextrose 5 % and 0.45% NaCl 50 mL/hr at 06/04/18 2201  . fluconazole (DIFLUCAN) IV 400 mg (06/05/18 1825)  . meropenem (MERREM) IV Stopped (06/05/18 1658)  . sodium chloride irrigation    . vancomycin 1,000 mg (06/05/18 1223)   PRN Meds:.acetaminophen, albuterol, magnesium hydroxide,  morphine injection, ondansetron **OR** ondansetron (ZOFRAN) IV, oxyCODONE  Micro Results Recent Results (from the past 240 hour(s))  Urine culture     Status: None   Collection Time: 06/04/18  3:25 PM  Result Value Ref Range Status   Specimen Description   Final    URINE, CATHETERIZED Performed at [facility], [address] [place], [place] [address]    Special Requests   Final    Normal Performed at [facility], [address] [place], [place] [address]    Culture   Final    NO GROWTH Performed at [facility], [address], [place], [place] [address]    Report Status 06/05/2018 FINAL  Final  Blood culture (routine x 2)     Status: None (Preliminary result)   Collection Time: 06/04/18  5:53 PM  Result Value Ref Range Status   Specimen Description BLOOD RIGHT ANTECUBITAL  Final   Special Requests   Final    BOTTLES DRAWN AEROBIC AND ANAEROBIC Blood Culture adequate volume Performed at [facility], [address], [place], [place] [address]    Culture   Final    NO GROWTH < 24 HOURS Performed at [facility], [address] [place], [place] [address]    Report Status PENDING  Incomplete  Blood culture (routine x 2)     Status: None (Preliminary result)   Collection Time: 06/04/18  5:55 PM  Result Value Ref Range Status   Specimen Description   Final    BLOOD RIGHT ANTECUBITAL Performed at [facility], [address], [place], [place] [address]    Special Requests   Final    BOTTLES DRAWN AEROBIC AND ANAEROBIC Blood Culture adequate volume Performed at [facility], [address], [place], [place] [address]    Culture   Final    NO GROWTH < 24 HOURS Performed at [facility], [address], [place], [place] [address]    Report Status PENDING  Incomplete    Radiology Reports Dg Chest 2 View  Result Date: 06/04/2018 CLINICAL DATA:  Bleeding into  catheter bag.  Wheezing. EXAM: CHEST - 2 VIEW COMPARISON:  05/09/2018 and 04/06/2018 radiographs.  CT 08/17/2017. FINDINGS: There is stable cardiomegaly status post median sternotomy and CABG. The upper sternotomy wires are chronically fractured. Right arm PICC extends to the level of the lower SVC. There is extensive chronic interstitial lung disease which appears slightly worse, possibly due to superimposed edema or inflammation. No confluent airspace opacity or significant pleural effusion. Old rib fractures are present on the left. IMPRESSION: Interval increased pulmonary opacities bilaterally, suspicious for edema or inflammation superimposed on chronic interstitial lung disease. Stable cardiomegaly. Electronically Signed   By: Carey Bullocks M.D.   On: 06/04/2018 16:11   Dg Chest 2 View  Result Date: 05/09/2018 CLINICAL DATA:  Fever. Buttock wound. History of interstitial lung disease. EXAM: CHEST - 2 VIEW COMPARISON:  Chest radiograph April 06, 2018 FINDINGS: Cardiac silhouette is moderately enlarged unchanged. Status post median sternotomy for CABG with multiple fractured sternotomy wires. Similar chronic interstitial changes without pleural effusion or focal consolidation. No pneumothorax. Old LEFT rib fractures. IMPRESSION: 1. Stable interstitial prominence consistent with history of interstitial lung disease. 2. Stable cardiomegaly. Electronically Signed   By: Awilda Metro M.D.   On: 05/09/2018 14:46   Ct Pelvis W Contrast  Result Date: 05/09/2018 CLINICAL DATA:  Buttock pain and fever. Sepsis muscle likely from buttock/perineal and cellulitis. Concern for possible necrotizing fasciitis. EXAM: CT PELVIS WITH CONTRAST TECHNIQUE: Multidetector CT imaging of the pelvis was performed using the standard protocol following the bolus administration of intravenous contrast. CONTRAST:  ISOVUE-300 IOPAMIDOL (ISOVUE-300) INJECTION 61% COMPARISON:  08/08/2017 CT FINDINGS: Urinary Tract: Physiologic  distention of the urinary bladder is noted. The distal ureters are unremarkable. Bowel: Normal appearing included appendix. Scattered colonic diverticulosis along the distal descending and sigmoid colon without acute diverticulitis. Mechanical bowel obstruction or inflammation. 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 Atherosclerosis with ectasia of the left common iliac artery 1.5 cm. No inguinal, pelvic sidewall, retroperitoneal or iliac chain adenopathy. Reproductive:  Normal size prostate and seminal vesicles. Other:  No free air nor free fluid. Musculoskeletal: Cellulitis of the medial right buttock with mottled peripherally enhancing soft tissue  abscess or stigmata necrotizing fasciitis just caudad to the right ischium in the expected location of the distal right semimembranosus, semitendinosus, rectus femoris and quadratus femoris muscles and tendons. This collection contains mottled foci of gas and is estimated at 5.9 x 4.8 cm on series 3/130. The craniocaudad extent is difficult to ascertain on the reformats as it is incompletely included. Cortical bone loss is noted of the right ischial tuberosity is concerning for changes of osteomyelitis. IMPRESSION: 1. Abnormal enhancing soft tissue collection centered just caudad to the right ischial tuberosity measuring at least 5.9 x 4.7 cm hand AP by transverse dimension and involving the distal right semimembranosus, semitendinosus, rectus femoris and quadratus femoris muscles and tendons. This raises concern for soft tissue abscess and/or stigmata of necrotizing fasciitis. 2. Cortical bone loss of the right ischial tuberosity also raises concern changes of osteomyelitis. 3. 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 4. Ectasia of the left common iliac artery to 1.5 cm. Electronically Signed   By: Tollie Eth M.D.   On: 05/09/2018 15:59   Ct Abdomen Pelvis W Contrast  Result Date:  06/04/2018 CLINICAL DATA:  Nausea and vomiting. Hematuria. Pyelonephritis. Pelvic decubitus ulcers. Abdominal aortic aneurysm. EXAM: CT ABDOMEN AND PELVIS WITH CONTRAST TECHNIQUE: Multidetector CT imaging of the abdomen and pelvis was performed using the standard protocol following bolus administration of intravenous contrast. CONTRAST:  ISOVUE-300 IOPAMIDOL (ISOVUE-300) INJECTION 61% COMPARISON:  Pelvis CT on 05/09/2018 and AP CT on 08/08/2017 FINDINGS: Lower Chest: No acute findings. Stable bibasilar interstitial fibrosis and mild bronchiectasis. Hepatobiliary: No hepatic masses identified. The gallbladder is mildly dilated and demonstrates diffuse nodular wall thickening. There is mild adjacent edema in right hepatic lobe. A small pericholecystic fluid collection is also seen between the gallbladder and right hepatic lobe, with adjacent small defects in the gallbladder wall, consistent with contained perforation. These findings are highly suspicious for acute cholecystitis with pericholecystic abscess. No evidence of biliary ductal dilatation. Pancreas:  No mass or inflammatory changes. Spleen: Within normal limits in size and appearance. Adrenals/Urinary Tract: No masses identified. No evidence of hydronephrosis. A Foley catheter is seen within urinary bladder. Despite being nearly empty gallbladder wall appears diffusely thickened and suspicious for cystitis. Stomach/Bowel: No evidence of obstruction, inflammatory process or abnormal fluid collections. Normal appendix visualized. Vascular/Lymphatic: No pathologically enlarged lymph nodes. Infrarenal abdominal aortic aneurysm is seen which measures 4.6 cm in maximum diameter, without significant change since previous study. No evidence of aneurysm leak or rupture. Reproductive:  No mass or other significant abnormality. Other:  No evidence of ascites or abscess. Musculoskeletal: A large deep decubitus ulcer is again seen in the right buttock which extends  to the right ischial tuberosity. Cortical thinning and irregularity is seen involving the right ischial tuberosity at this site, suspicious for osteomyelitis. A 4.2 cm rim enhancing fluid collection is seen in the adductor muscles of the proximal right thigh. This has decreased in size compared to approximately 5 cm on previous study, and is consistent with abscess. IMPRESSION: Findings consistent with acute cholecystitis and small pericholecystic abscess. Surgical consultation is recommended. 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 4.2 cm abscess in the adductor muscles of the proximal right thigh, mildly decreased in size compared with previous study. Diffuse urinary bladder wall thickening, suspicious for cystitis. Suggest correlation with urinalysis. Stable 4.6 cm abdominal aortic aneurysm. Recommend followup by abdomen and pelvis CTA in 6 months, and vascular surgery referral/consultation if  not already obtained. This recommendation follows ACR consensus guidelines: White Paper of the ACR Incidental Findings Committee II on Vascular Findings. J Am Coll Radiol 2013; 10:789-794. Electronically Signed   By: Myles Rosenthal M.D.   On: 06/04/2018 17:21   [place] Ekg Site Rite  Result Date: 05/16/2018 If Site Rite image not attached, placement could not be confirmed due to current cardiac rhythm.   Time Spent in minutes  30   Pearson Grippe M.D on 06/05/2018 at 6:25 PM  Between 7am to 7pm - Pager - [phone]    After 7pm go to [email or web address] - password [email or web address]  [facility] -  Office  [phone]

## 2018-06-06 DIAGNOSIS — J449 Chronic obstructive pulmonary disease, unspecified: Secondary | ICD-10-CM

## 2018-06-06 DIAGNOSIS — F17210 Nicotine dependence, cigarettes, uncomplicated: Secondary | ICD-10-CM

## 2018-06-06 DIAGNOSIS — R319 Hematuria, unspecified: Secondary | ICD-10-CM

## 2018-06-06 DIAGNOSIS — Z7952 Long term (current) use of systemic steroids: Secondary | ICD-10-CM

## 2018-06-06 DIAGNOSIS — K810 Acute cholecystitis: Secondary | ICD-10-CM

## 2018-06-06 DIAGNOSIS — B9689 Other specified bacterial agents as the cause of diseases classified elsewhere: Secondary | ICD-10-CM

## 2018-06-06 DIAGNOSIS — J849 Interstitial pulmonary disease, unspecified: Secondary | ICD-10-CM

## 2018-06-06 DIAGNOSIS — L0231 Cutaneous abscess of buttock: Secondary | ICD-10-CM

## 2018-06-06 DIAGNOSIS — N4889 Other specified disorders of penis: Secondary | ICD-10-CM

## 2018-06-06 DIAGNOSIS — K819 Cholecystitis, unspecified: Secondary | ICD-10-CM

## 2018-06-06 DIAGNOSIS — M869 Osteomyelitis, unspecified: Secondary | ICD-10-CM

## 2018-06-06 DIAGNOSIS — B954 Other streptococcus as the cause of diseases classified elsewhere: Secondary | ICD-10-CM

## 2018-06-06 DIAGNOSIS — Z8673 Personal history of transient ischemic attack (TIA), and cerebral infarction without residual deficits: Secondary | ICD-10-CM

## 2018-06-06 DIAGNOSIS — L8990 Pressure ulcer of unspecified site, unspecified stage: Secondary | ICD-10-CM

## 2018-06-06 DIAGNOSIS — L02415 Cutaneous abscess of right lower limb: Secondary | ICD-10-CM

## 2018-06-06 DIAGNOSIS — Z960 Presence of urogenital implants: Secondary | ICD-10-CM

## 2018-06-06 DIAGNOSIS — L89214 Pressure ulcer of right hip, stage 4: Secondary | ICD-10-CM

## 2018-06-06 LAB — COMPREHENSIVE METABOLIC PANEL
ALBUMIN: 2.6 g/dL — AB (ref 3.5–5.0)
ALK PHOS: 93 U/L (ref 38–126)
ALT: 13 U/L (ref 0–44)
AST: 15 U/L (ref 15–41)
Anion gap: 8 (ref 5–15)
BILIRUBIN TOTAL: 1.3 mg/dL — AB (ref 0.3–1.2)
BUN: 12 mg/dL (ref 8–23)
CO2: 25 mmol/L (ref 22–32)
CREATININE: 0.53 mg/dL — AB (ref 0.61–1.24)
Calcium: 8.5 mg/dL — ABNORMAL LOW (ref 8.9–10.3)
Chloride: 108 mmol/L (ref 98–111)
GFR calc Af Amer: >60 mL/min (ref >60)
GLUCOSE: 96 mg/dL (ref 70–99)
POTASSIUM: 3.5 mmol/L (ref 3.5–5.1)
Sodium: 141 mmol/L (ref 135–145)
TOTAL PROTEIN: 5.9 g/dL — AB (ref 6.5–8.1)

## 2018-06-06 LAB — CBC
HCT: 36.1 % — ABNORMAL LOW (ref 39.0–52.0)
Hemoglobin: 11.2 g/dL — ABNORMAL LOW (ref 13.0–17.0)
MCH: 26.9 pg (ref 26.0–34.0)
MCHC: 31 g/dL (ref 30.0–36.0)
MCV: 86.8 fL (ref 78.0–100.0)
Platelets: 237 10*3/uL (ref 150–400)
RBC: 4.16 MIL/uL — ABNORMAL LOW (ref 4.22–5.81)
RDW: 16.1 % — ABNORMAL HIGH (ref 11.5–15.5)
WBC: 7.4 10*3/uL (ref 4.0–10.5)

## 2018-06-06 MED ORDER — SODIUM CHLORIDE 0.9 % IV SOLN
1.0000 g | INTRAVENOUS | Status: DC
  Administered 2018-06-06 – 2018-06-07 (×2): 1000 mg via INTRAVENOUS
  Filled 2018-06-06 (×2): qty 1

## 2018-06-06 MED ORDER — VANCOMYCIN HCL 10 G IV SOLR
1250.0000 mg | Freq: Two times a day (BID) | INTRAVENOUS | Status: DC
  Filled 2018-06-06: qty 1250

## 2018-06-06 MED ORDER — ATORVASTATIN CALCIUM 40 MG PO TABS
40.0000 mg | ORAL_TABLET | Freq: Every day | ORAL | Status: DC
  Administered 2018-06-06 – 2018-06-07 (×2): 40 mg via ORAL
  Filled 2018-06-06 (×2): qty 1

## 2018-06-06 NOTE — Progress Notes (Addendum)
Patient Demographics:    [name], is a 62 y.o. male, DOB - [birth year], ZOX:[id number]  Admit date - 06/04/2018   Admitting Physician Carron Curie, MD  Outpatient Primary MD for the patient is Denny Levy  LOS - 2   Chief Complaint  Patient presents with  . Blood in Cath        Subjective:    [name] today has no fevers, no emesis,  No chest pain, no new complaints, patient wants to eat  Assessment  & Plan :    Active Problems:   Cholecystitis   Complicated UTI (urinary tract infection)   Pressure injury of skin  Brief summary 62 y.o.male,who has been recently admitted on 05/09/2018 for gluteal abscess and osteomyelitis on the right, status post I&D by Dr. Gerrit Friends, status post treatment with vancomycin clindamycin and Zosyn and later on transitioned to ertapenem which he is still on,presenting with dysuria, hematuria and penile pain. No history of fever chills nausea or vomiting .CT of abdomen was consistent with acute cholecystitis and small pericholecystic abscess. 4.2 cm abscess in the abductor muscles of the proximal right thigh was noted ,mildly decreased compared to the previous study. He was also noted to have findings of cystitis and a stable 4.6 cm abdominal aortic aneurysm.  On admission on 8/3/2019Invanz was switched to meropenem to cover for resistant Pseudomonas , vancomycin was added to cover for MRSA and Diflucan was added, discussed with Dr. Algis Liming on 06/06/18 from infectious disease, recommends narrowing back down antibiotic to Invanz through PICC line    Plan:- 1)Rt Hip/Gluteal Area Decubitus Ulcers with Abscess/Osteomyelitis in the Adductor muscles of the proximal right thigh- stage IV ulcers  with osteomyelitis discussed with Dr. Algis Liming on 06/06/18 from infectious disease, recommends narrowing back down antibiotic to Invanz through PICC line  2)H/o CAD with  Prior CABG/PAD with prior carotid endarterectomy/4.6 cm AAA-stable, no ACS type symptoms, continue Coreg 6.25 mg twice daily, Plavix 75 mg daily as well as Lipitor  3)CT findings of possible cholecystitis--as per surgical team clinically patient does NOT have acute cholecystitis , patient is largely asymptomatic, surgical consult from Dr. Romie Levee noted, surgery does not plan any surgical intervention at this time as patient is asymptomatic  4)Possible catheter associated UTI--- await urine cultures, continue events as advised by infectious disease, Ct abd shows Diffuse urinary bladder wall thickening, suspicious for cystitis.  Code Status : Full   Disposition Plan  : SNF  Consults  :  ID   DVT Prophylaxis  :  Lovenox   Lab Results  Component Value Date   PLT 237 06/06/2018    Inpatient Medications  Scheduled Meds: . carvedilol  6.25 mg Oral BID WC  . clopidogrel  75 mg Oral Daily  . enoxaparin (LOVENOX) injection  40 mg Subcutaneous Q24H  . feeding supplement (PRO-STAT SUGAR FREE 64)  30 mL Oral BID  . gabapentin  300 mg Oral Q8H  . HYDROcodone-acetaminophen  1 tablet Oral Q8H  . ipratropium-albuterol  3 mL Nebulization BID  . lidocaine  1 application Urethral Once  . mirtazapine  7.5 mg Oral QHS  . multivitamin with minerals  1 tablet Oral Daily  . nicotine  7 mg Transdermal Daily  .  nutrition supplement (JUVEN)  1 packet Oral BID BM  . polyethylene glycol  17 g Oral BID  . potassium chloride SA  40 mEq Oral Daily  . predniSONE  10 mg Oral Q breakfast   Continuous Infusions: . dextrose 5 % and 0.45% NaCl 50 mL/hr at 06/06/18 0624  . ertapenem    . sodium chloride irrigation     PRN Meds:.acetaminophen, albuterol, magnesium hydroxide, morphine injection, ondansetron **OR** ondansetron (ZOFRAN) IV, oxyCODONE    Anti-infectives (From admission, onward)   Start     Dose/Rate Route Frequency Ordered Stop   06/06/18 1800  vancomycin (VANCOCIN) 1,250 mg in sodium  chloride 0.9 % 250 mL IVPB  Status:  Discontinued     1,250 mg 166.7 mL/hr over 90 Minutes Intravenous Every 12 hours 06/06/18 1135 06/06/18 1350   06/06/18 1800  ertapenem (INVANZ) 1,000 mg in sodium chloride 0.9 % 100 mL IVPB     1 g 200 mL/hr over 30 Minutes Intravenous Every 24 hours 06/06/18 1350     06/05/18 1800  fluconazole (DIFLUCAN) IVPB 400 mg  Status:  Discontinued     400 mg 100 mL/hr over 120 Minutes Intravenous Every 24 hours 06/04/18 2121 06/06/18 1350   06/05/18 0400  vancomycin (VANCOCIN) IVPB 1000 mg/200 mL premix  Status:  Discontinued     1,000 mg 200 mL/hr over 60 Minutes Intravenous Every 8 hours 06/04/18 2121 06/06/18 1135   06/05/18 0000  meropenem (MERREM) 1 g in sodium chloride 0.9 % 100 mL IVPB  Status:  Discontinued     1 g 200 mL/hr over 30 Minutes Intravenous Every 8 hours 06/04/18 2121 06/06/18 1350   06/04/18 1845  fluconazole (DIFLUCAN) IVPB 800 mg     800 mg 200 mL/hr over 120 Minutes Intravenous  Once 06/04/18 1831 06/04/18 2137   06/04/18 1800  meropenem (MERREM) 1 g in sodium chloride 0.9 % 100 mL IVPB     1 g 200 mL/hr over 30 Minutes Intravenous STAT 06/04/18 1747 06/04/18 1933   06/04/18 1800  vancomycin (VANCOCIN) 1,500 mg in sodium chloride 0.9 % 500 mL IVPB     1,500 mg 250 mL/hr over 120 Minutes Intravenous STAT 06/04/18 1747 06/04/18 2135        Objective:   Vitals:   06/05/18 2014 06/06/18 0425 06/06/18 0820 06/06/18 1408  BP: 130/85 112/78  125/81  Pulse: 95 89  87  Resp: 18 18  18   Temp: 98 F (36.7 C) 98.5 F (36.9 C)  98.4 F (36.9 C)  TempSrc:  Oral  Oral  SpO2: 95% 93% 94% 96%  Weight:      Height:        Wt Readings from Last 3 Encounters:  06/04/18 88.5 kg (195 lb)  05/16/18 88.8 kg (195 lb 12.3 oz)  04/29/18 90.4 kg (199 lb 6.4 oz)     Intake/Output Summary (Last 24 hours) at 06/06/2018 1452 Last data filed at 06/06/2018 0900 Gross per 24 hour  Intake 732.59 ml  Output 2650 ml  Net -1917.41 ml    Physical  Exam  Gen:- Awake Alert,  In no apparent distress  HEENT:- [place].AT, No sclera icterus Neck-Supple Neck,No JVD,.  Lungs-  CTAB , good air movement bilaterally CV- S1, S2 normal , regular Abd-  +ve B.Sounds, Abd Soft, No tenderness,    Extremity-No  edema, good pulses Psych-affect is appropriate, oriented x3 Neuro-chronic neuromuscular deficits, however no new focal deficits, no tremors GU-Foley catheter with clear urine Skin:- Rt  Gluteal/upper Thigh/Hip area--- with very large, deep, wound with packing and dressing over it, serosanguineous drainage noted   Data Review:   Micro Results Recent Results (from the past 240 hour(s))  Urine culture     Status: None   Collection Time: 06/04/18  3:25 PM  Result Value Ref Range Status   Specimen Description   Final    URINE, CATHETERIZED Performed at [facility], [address] [place], [place] [address]    Special Requests   Final    Normal Performed at [facility], [address], [place], [place] [address]    Culture   Final    NO GROWTH Performed at [facility], [address] [place], [place] [address]    Report Status 06/05/2018 FINAL  Final  Blood culture (routine x 2)     Status: None (Preliminary result)   Collection Time: 06/04/18  5:53 PM  Result Value Ref Range Status   Specimen Description BLOOD RIGHT ANTECUBITAL  Final   Special Requests   Final    BOTTLES DRAWN AEROBIC AND ANAEROBIC Blood Culture adequate volume Performed at [facility], [address], [place], [place] [address]    Culture   Final    NO GROWTH 2 DAYS Performed at [facility], [address] [place], [place] [address]    Report Status PENDING  Incomplete  Blood culture (routine x 2)     Status: None (Preliminary result)   Collection Time: 06/04/18  5:55 PM  Result Value Ref Range Status   Specimen Description   Final    BLOOD RIGHT ANTECUBITAL Performed at [facility], [address] [place], [place] [address]    Special Requests   Final    BOTTLES DRAWN AEROBIC AND ANAEROBIC Blood Culture adequate volume Performed at [facility], [address] [place], [place] [address]    Culture   Final    NO GROWTH 2 DAYS Performed at [facility], [address] [place], [place] [address]    Report Status PENDING  Incomplete    Radiology Reports Dg Chest 2 View  Result Date: 06/04/2018 CLINICAL DATA:  Bleeding into catheter bag.  Wheezing. EXAM: CHEST - 2 VIEW COMPARISON:  05/09/2018 and 04/06/2018 radiographs.  CT 08/17/2017. FINDINGS: There is stable cardiomegaly status post median sternotomy and CABG. The upper sternotomy wires are chronically fractured. Right arm PICC extends to the level of the lower SVC. There is extensive chronic interstitial lung disease which appears slightly worse, possibly due to superimposed edema or inflammation. No confluent airspace opacity or significant pleural effusion. Old rib fractures are present on the left. IMPRESSION: Interval increased pulmonary opacities bilaterally, suspicious for edema or inflammation superimposed on chronic interstitial lung disease. Stable cardiomegaly. Electronically Signed   By: Carey Bullocks M.D.   On: 06/04/2018 16:11   Dg Chest 2 View  Result Date: 05/09/2018 CLINICAL DATA:  Fever. Buttock wound. History of interstitial lung disease. EXAM: CHEST - 2 VIEW COMPARISON:  Chest radiograph April 06, 2018 FINDINGS: Cardiac silhouette is moderately enlarged unchanged. Status post median sternotomy for CABG with multiple fractured sternotomy wires. Similar chronic interstitial changes without pleural effusion or focal consolidation. No pneumothorax. Old LEFT rib fractures. IMPRESSION: 1. Stable interstitial prominence consistent with history of interstitial lung disease. 2. Stable cardiomegaly. Electronically Signed   By: Awilda Metro M.D.   On: 05/09/2018  14:46   Ct Pelvis W  Contrast  Result Date: 05/09/2018 CLINICAL DATA:  Buttock pain and fever. Sepsis muscle likely from buttock/perineal and cellulitis. Concern for possible necrotizing fasciitis. EXAM: CT PELVIS WITH CONTRAST TECHNIQUE: Multidetector CT imaging of the pelvis was performed using the standard protocol following the bolus administration of intravenous contrast. CONTRAST:  ISOVUE-300 IOPAMIDOL (ISOVUE-300) INJECTION 61% COMPARISON:  08/08/2017 CT FINDINGS: Urinary Tract: Physiologic distention of the urinary bladder is noted. The distal ureters are unremarkable. Bowel: Normal appearing included appendix. Scattered colonic diverticulosis along the distal descending and sigmoid colon without acute diverticulitis. Mechanical bowel obstruction or inflammation. 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 Atherosclerosis with ectasia of the left common iliac artery 1.5 cm. No inguinal, pelvic sidewall, retroperitoneal or iliac chain adenopathy. Reproductive:  Normal size prostate and seminal vesicles. Other:  No free air nor free fluid. Musculoskeletal: Cellulitis of the medial right buttock with mottled peripherally enhancing soft tissue abscess or stigmata necrotizing fasciitis just caudad to the right ischium in the expected location of the distal right semimembranosus, semitendinosus, rectus femoris and quadratus femoris muscles and tendons. This collection contains mottled foci of gas and is estimated at 5.9 x 4.8 cm on series 3/130. The craniocaudad extent is difficult to ascertain on the reformats as it is incompletely included. Cortical bone loss is noted of the right ischial tuberosity is concerning for changes of osteomyelitis. IMPRESSION: 1. Abnormal enhancing soft tissue collection centered just caudad to the right ischial tuberosity measuring at least 5.9 x 4.7 cm hand AP by transverse dimension and involving the distal right  semimembranosus, semitendinosus, rectus femoris and quadratus femoris muscles and tendons. This raises concern for soft tissue abscess and/or stigmata of necrotizing fasciitis. 2. Cortical bone loss of the right ischial tuberosity also raises concern changes of osteomyelitis. 3. 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 4. Ectasia of the left common iliac artery to 1.5 cm. Electronically Signed   By: Tollie Eth M.D.   On: 05/09/2018 15:59   Ct Abdomen Pelvis W Contrast  Result Date: 06/04/2018 CLINICAL DATA:  Nausea and vomiting. Hematuria. Pyelonephritis. Pelvic decubitus ulcers. Abdominal aortic aneurysm. EXAM: CT ABDOMEN AND PELVIS WITH CONTRAST TECHNIQUE: Multidetector CT imaging of the abdomen and pelvis was performed using the standard protocol following bolus administration of intravenous contrast. CONTRAST:  ISOVUE-300 IOPAMIDOL (ISOVUE-300) INJECTION 61% COMPARISON:  Pelvis CT on 05/09/2018 and AP CT on 08/08/2017 FINDINGS: Lower Chest: No acute findings. Stable bibasilar interstitial fibrosis and mild bronchiectasis. Hepatobiliary: No hepatic masses identified. The gallbladder is mildly dilated and demonstrates diffuse nodular wall thickening. There is mild adjacent edema in right hepatic lobe. A small pericholecystic fluid collection is also seen between the gallbladder and right hepatic lobe, with adjacent small defects in the gallbladder wall, consistent with contained perforation. These findings are highly suspicious for acute cholecystitis with pericholecystic abscess. No evidence of biliary ductal dilatation. Pancreas:  No mass or inflammatory changes. Spleen: Within normal limits in size and appearance. Adrenals/Urinary Tract: No masses identified. No evidence of hydronephrosis. A Foley catheter is seen within urinary bladder. Despite being nearly empty gallbladder wall appears diffusely thickened and suspicious for cystitis. Stomach/Bowel: No evidence of  obstruction, inflammatory process or abnormal fluid collections. Normal appendix visualized. Vascular/Lymphatic: No pathologically enlarged lymph nodes. Infrarenal abdominal aortic aneurysm is seen which measures 4.6 cm in maximum diameter, without significant change since previous study. No evidence of aneurysm leak or rupture. Reproductive:  No mass or other significant abnormality. Other:  No evidence of ascites or abscess. Musculoskeletal: A large deep decubitus ulcer is again seen in the right buttock which extends to the right ischial tuberosity. Cortical thinning and irregularity is seen involving the right ischial tuberosity at this site, suspicious for osteomyelitis. A 4.2 cm rim enhancing fluid collection is seen in the adductor muscles of the proximal right thigh. This has decreased in size compared to approximately 5 cm on previous study, and is consistent with abscess. IMPRESSION: Findings consistent with acute cholecystitis and small pericholecystic abscess. Surgical consultation is recommended. 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 4.2 cm abscess in the adductor muscles of the proximal right thigh, mildly decreased in size compared with previous study. Diffuse urinary bladder wall thickening, suspicious for cystitis. Suggest correlation with urinalysis. Stable 4.6 cm abdominal aortic aneurysm. Recommend followup by abdomen and pelvis CTA in 6 months, and vascular surgery referral/consultation if not already obtained. This recommendation follows ACR consensus guidelines: White Paper of the ACR Incidental Findings Committee II on Vascular Findings. J Am Coll Radiol 2013; 10:789-794. Electronically Signed   By: Myles Rosenthal M.D.   On: 06/04/2018 17:21   [place] Ekg Site Rite  Result Date: 05/16/2018 If Site Rite image not attached, placement could not be confirmed due to current cardiac rhythm.    CBC Recent Labs  Lab  06/04/18 1525 06/06/18 0411  WBC 7.1 7.4  HGB 11.2* 11.2*  HCT 36.1* 36.1*  PLT 248 237  MCV 87.8 86.8  MCH 27.3 26.9  MCHC 31.0 31.0  RDW 16.3* 16.1*  LYMPHSABS 3.0  --   MONOABS 0.6  --   EOSABS 0.2  --   BASOSABS 0.0  --     Chemistries  Recent Labs  Lab 06/04/18 1525 06/05/18 0516 06/06/18 0411  NA 140  --  141  K 3.4*  --  3.5  CL 106  --  108  CO2 26  --  25  GLUCOSE 105*  --  96  BUN 14  --  12  CREATININE 0.58* 0.57* 0.53*  CALCIUM 8.4*  --  8.5*  AST 15  --  15  ALT 15  --  13  ALKPHOS 94  --  93  BILITOT 1.1  --  1.3*   ------------------------------------------------------------------------------------------------------------------ No results for input(s): CHOL, HDL, LDLCALC, TRIG, CHOLHDL, LDLDIRECT in the last 72 hours.  Lab Results  Component Value Date   HGBA1C 5.2 04/11/2018   ------------------------------------------------------------------------------------------------------------------ No results for input(s): TSH, T4TOTAL, T3FREE, THYROIDAB in the last 72 hours.  Invalid input(s): FREET3 ------------------------------------------------------------------------------------------------------------------ No results for input(s): VITAMINB12, FOLATE, FERRITIN, TIBC, IRON, RETICCTPCT in the last 72 hours.  Coagulation profile Recent Labs  Lab 06/04/18 1525  INR 1.00    No results for input(s): DDIMER in the last 72 hours.  Cardiac Enzymes No results for input(s): CKMB, TROPONINI, MYOGLOBIN in the last 168 hours.  Invalid input(s): CK ------------------------------------------------------------------------------------------------------------------    Component Value Date/Time   BNP 232.1 (H) 05/09/2018 1332    Shon Hale M.D on 06/06/2018 at 2:52 PM   Go to [email or web address] - password [email or web address] for contact info  [facility] - Office  [phone]

## 2018-06-06 NOTE — Consult Note (Addendum)
Date of Admission:  06/04/2018          Reason for Consult: Polymicrobial abscess and osteomyelitis and possible pericholecystic abscess     Referring Provider: Dr. Mariea Clonts   Assessment:  1. Polymicrobial gluteal abscess with associated osteomyelitis recently with worsening drainage 2. Persistent though smaller Abscess in adductor muscles of right thigh 3. Pericholecystic abscess 4. Penile pain due to irritation at foley catheter site  Plan:  1. Narrow antibiotics back to Invanz 2. If drainage from site from I and D worsens would ask surgery to see again 3. If the abscess in thigh  is not to be operated on then patient should be reimaged prior to stopping antibiotics 4. Given the question of the pericholecystic abscess I would also recommend reimaging this site and these both could be accomplished by obtaining a CT scan in roughly 1 month's time. 5. I would in the interim extend duration of IV Invanz to at least another 4 to 6 weeks so that we can get repeat imaging of the 2 areas of concern 6. Discuss with RN and or Urology how to alleviate pain due to Foley. Does he need different catheter? Exam of meatus?  Active Problems:   Cholecystitis   Complicated UTI (urinary tract infection)   Pressure injury of skin   Scheduled Meds: . atorvastatin  40 mg Oral q1800  . carvedilol  6.25 mg Oral BID WC  . clopidogrel  75 mg Oral Daily  . enoxaparin (LOVENOX) injection  40 mg Subcutaneous Q24H  . feeding supplement (PRO-STAT SUGAR FREE 64)  30 mL Oral BID  . gabapentin  300 mg Oral Q8H  . HYDROcodone-acetaminophen  1 tablet Oral Q8H  . ipratropium-albuterol  3 mL Nebulization BID  . lidocaine  1 application Urethral Once  . mirtazapine  7.5 mg Oral QHS  . multivitamin with minerals  1 tablet Oral Daily  . nicotine  7 mg Transdermal Daily  . nutrition supplement (JUVEN)  1 packet Oral BID BM  . polyethylene glycol  17 g Oral BID  . potassium chloride SA  40 mEq Oral Daily  .  predniSONE  10 mg Oral Q breakfast   Continuous Infusions: . dextrose 5 % and 0.45% NaCl 50 mL/hr at 06/06/18 0624  . ertapenem    . sodium chloride irrigation     PRN Meds:.acetaminophen, albuterol, magnesium hydroxide, morphine injection, ondansetron **OR** ondansetron (ZOFRAN) IV, oxyCODONE  HPI: [name] is a 62 y.o. male  with hx of COPD and ILD (on chronic plaquinil, prednisone 10mg ), who was in hospital 04-2018 with shock due to hypovolemia, adrenal insufficiency. His course was complicated by L basilar gangliar stroke and stage II buttocks ulcer. He was d/c to SNF.  He returns on 7-8 with temp 101.4, WBC of 9.4 and lactate 2.54. He was found on CT to have a 5.9 x 4.7 cm soft tissue collection caudal to the r ischial tuberosity. There was concern for necrotizing fascitis and osteomyelitis on CT.   He underwent I and D of loculated abscess by Surgery and culture grew:   MODERATE CITROBACTER BRAAKII  MODERATE VIRIDANS STREPTOCOCCUS  MODERATE BACTEROIDES THETAIOTAOMICRON  BETA LACTAMASE POSITIVE  FEW EIKENELLA CORRODENS     His antibiotics were consolidated to ertapenem with plans of 6 weeks of therapy.  He states that while at the skilled nursing facility he developed increased drainage from his wound.  When he came to the hospital the notes from the ER staff and  admitting physician indicated the patient was complaining of pain in his penis hematuria and dysuria.  There was concern for possible urinary tract infection urine analysis was done which showed pyuria urine culture was taken and he was broadened to meropenem vancomycin and fluconazole.  In the interim his urine cultures have been completely sterile.  He did undergo a CT of the abdomen and pelvis which raised the suspicion of cholecystitis and a pericholecystic abscess.  It also disclosed a 4.2 cm abscess in the adductor muscle seen on CT scan.  This had diminished in size from 5 cm when imaged before.    When I  examined him today his chief complaint remains the pain in his penis and his pain is due to the Foley catheter.  When I examined him and he manipulation of the Foley catheter caused him quite a bit of distress.  He otherwise does not have any complaints beyond the drainage from his wound.  I currently see no need for the breath of his antibiotics given that we have not found a new multidrug-resistant organisms from cultures that have been taken and I am narrowing him back to ertapenem.  The main issues are him now or whether or not he needs surgery on any of the areas that have been identified.  General surgery have seen and  do not feel that there is an indication for surgery in his abdomen.  The abscess in the adductor muscles of thigh has reduced in size though it is still 4.2 cm in dimension.  If no further surgery is being performed I would recommend extending his ertapenem for 6 weeks and repeating a CT of the abdomen and pelvis in the interim to ensure that these abscesses are getting smaller.  I will leave an opAT note with regards to duration of antibiotics.  Otherwise I will sign off please call back with further questions.   Review of Systems: Review of Systems  Constitutional: Positive for malaise/fatigue. Negative for chills, diaphoresis, fever and weight loss.  HENT: Negative for congestion, hearing loss, sore throat and tinnitus.   Eyes: Negative for blurred vision and double vision.  Respiratory: Negative for cough, hemoptysis, sputum production, shortness of breath and wheezing.   Cardiovascular: Negative for chest pain, palpitations, orthopnea, claudication and leg swelling.  Gastrointestinal: Negative for abdominal pain, blood in stool, constipation, diarrhea, heartburn, melena, nausea and vomiting.  Genitourinary: Positive for hematuria. Negative for dysuria, flank pain and frequency.  Musculoskeletal: Positive for myalgias. Negative for back pain, falls, joint pain and  neck pain.  Skin: Negative for itching and rash.  Neurological: Negative for dizziness, sensory change, focal weakness, loss of consciousness, weakness and headaches.  Endo/Heme/Allergies: Does not bruise/bleed easily.  Psychiatric/Behavioral: Negative for depression, memory loss and suicidal ideas. The patient is not nervous/anxious.     Past Medical History:  Diagnosis Date  . AAA (abdominal aortic aneurysm) (HCC)   . AKI (acute kidney injury) (HCC)    a. Cr up to 8 in 04/2018.  . Arthritis   . CAD in native artery    a. h/o MI x 3 s/p CABG x 4 (1993 with Tyrone Sage). b. LHC 01/2017 with severe native disease, 2 grafts occluded.  . Carotid artery disease (HCC)    a. s/p R CEA 11/2016.  [name] Chronic combined systolic and diastolic CHF (congestive heart failure) (HCC)   . GERD (gastroesophageal reflux disease)   . Hyperkalemia   . ILD (interstitial lung disease) (HCC)   . Lung  nodule   . MI (myocardial infarction) (HCC)    x 3   . Normocytic anemia   . NSVT (nonsustained ventricular tachycardia) (HCC)   . Pancreatitis    1/19  . Pneumonia 11/2017  . RA (rheumatoid arthritis) (HCC)   . Stroke (HCC)    a. 2018. b. possible recurrence in 04/2018, seen by neuro  . Tobacco abuse     Social History   Tobacco Use  . Smoking status: Current Every Day Smoker    Packs/day: 0.50    Years: 47.00    Pack years: 23.50    Types: Cigarettes  . Smokeless tobacco: Never Used  . Tobacco comment: half pack every other day  Substance Use Topics  . Alcohol use: No    Comment: remote history of beer drinking  . Drug use: No    Family History  Problem Relation Age of Onset  . Heart murmur Mother   . Heart disease Mother   . Heart attack Mother   . Suicidality Father   . Heart attack Father   . Stroke Brother   . Heart attack Brother   . Heart attack Maternal Grandmother   . Cancer Maternal Grandmother        unknown type  . Lung cancer Maternal Aunt   . Lung cancer Maternal Uncle   .  Cancer Maternal Aunt        unknown cancer  . Heart attack Brother    No Known Allergies  OBJECTIVE: Blood pressure 125/81, pulse 87, temperature 98.4 F (36.9 C), temperature source Oral, resp. rate 18, height 6\' 1"  (1.854 m), weight 195 lb (88.5 kg), SpO2 96 %.  Physical Exam  Constitutional: He is cooperative. He does not appear ill. No distress.  HENT:  Head: Normocephalic and atraumatic.  Right Ear: Hearing and external ear normal.  Left Ear: Hearing and external ear normal.  Nose: No rhinorrhea or nasal deformity. No epistaxis.  Eyes: Pupils are equal, round, and reactive to light. Conjunctivae and EOM are normal. Right conjunctiva is not injected. Left conjunctiva is not injected. No scleral icterus.  Neck: Normal range of motion. Neck supple. No JVD present.  Cardiovascular: Normal rate, regular rhythm, S1 normal, S2 normal and normal heart sounds. Exam reveals no friction rub.  No murmur heard. Pulmonary/Chest: Effort normal and breath sounds normal. No stridor. No respiratory distress. He has no wheezes. He has no rales.  Abdominal: Soft. Normal appearance and bowel sounds are normal. He exhibits no distension and no ascites. There is no hepatosplenomegaly. There is no tenderness. There is no guarding.  Musculoskeletal:       Right shoulder: Normal.       Left shoulder: Normal.       Right hip: Normal.       Left hip: Normal.       Right knee: Normal.       Left knee: Normal.  Lymphadenopathy:       Head (right side): No submandibular, no preauricular and no posterior auricular adenopathy present.       Head (left side): No submandibular, no preauricular and no posterior auricular adenopathy present.    He has no cervical adenopathy.       Right cervical: No superficial cervical and no deep cervical adenopathy present.      Left cervical: No superficial cervical and no deep cervical adenopathy present.  Neurological: He is alert. He has normal strength. Gait normal.    Skin: Skin is warm, dry  and intact. No abrasion, no bruising, no ecchymosis and no lesion noted. He is not diaphoretic. No cyanosis. Nails show no clubbing.  Psychiatric: His speech is normal and behavior is normal. Thought content normal. Cognition and memory are normal. He is attentive.   No erythema in glans of penis but he has QUITE a bit of pain when I foley catheter manipulated at al  Lab Results Lab Results  Component Value Date   WBC 7.4 06/06/2018   HGB 11.2 (L) 06/06/2018   HCT 36.1 (L) 06/06/2018   MCV 86.8 06/06/2018   PLT 237 06/06/2018    Lab Results  Component Value Date   CREATININE 0.53 (L) 06/06/2018   BUN 12 06/06/2018   NA 141 06/06/2018   K 3.5 06/06/2018   CL 108 06/06/2018   CO2 25 06/06/2018    Lab Results  Component Value Date   ALT 13 06/06/2018   AST 15 06/06/2018   ALKPHOS 93 06/06/2018   BILITOT 1.3 (H) 06/06/2018     Microbiology: Recent Results (from the past 240 hour(s))  Urine culture     Status: None   Collection Time: 06/04/18  3:25 PM  Result Value Ref Range Status   Specimen Description   Final    URINE, CATHETERIZED Performed at [facility], [address] [place], [place] [address]    Special Requests   Final    Normal Performed at [facility], [address] [place], [place] [address]    Culture   Final    NO GROWTH Performed at [facility], [address], [place], [place] [address]    Report Status 06/05/2018 FINAL  Final  Blood culture (routine x 2)     Status: None (Preliminary result)   Collection Time: 06/04/18  5:53 PM  Result Value Ref Range Status   Specimen Description BLOOD RIGHT ANTECUBITAL  Final   Special Requests   Final    BOTTLES DRAWN AEROBIC AND ANAEROBIC Blood Culture adequate volume Performed at [facility], [address], [place], [place] [address]    Culture   Final    NO GROWTH 2 DAYS Performed at [facility],  [address] [place], [place] [address]    Report Status PENDING  Incomplete  Blood culture (routine x 2)     Status: None (Preliminary result)   Collection Time: 06/04/18  5:55 PM  Result Value Ref Range Status   Specimen Description   Final    BLOOD RIGHT ANTECUBITAL Performed at [facility], [address] [place], [place] [address]    Special Requests   Final    BOTTLES DRAWN AEROBIC AND ANAEROBIC Blood Culture adequate volume Performed at [facility], [address] [place], [place] [address]    Culture   Final    NO GROWTH 2 DAYS Performed at [facility], [address] [place], [place] [address]    Report Status PENDING  Incomplete    Acey Lav, MD [facility] [phone] pager  06/06/2018, 4:15 PM

## 2018-06-06 NOTE — Progress Notes (Signed)
      INFECTIOUS DISEASE ATTENDING ADDENDUM:   Date: 06/06/2018  Patient name: [name]  Medical record number: [id number]  Date of birth: 1956/07/06   Diagnosis: Gluteal abscess, abscess in right thigh adductors, pericholecytic abscess  Culture Result: no new cultures. See last note re cx from July  No Known Allergies  OPAT Orders Discharge antibiotics: Invanz 1 gram daily  Duration: 6 weeks End Date: September 13th  [place] Per Protocol:  Labs weekly while on IV antibiotics: _x_ CBC with differential _x_ BMP  _x_ CRP _x_ ESR __ Vancomycin trough  __ Please pull PIC at completion of IV antibiotics _x_ Please leave PIC in place until doctor has seen patient or been notified  Fax weekly labs to [phone]  Clinic Follow Up Appt:  Next 3 weeks    Rhina Brackett Dam 06/06/2018, 4:30 PM

## 2018-06-06 NOTE — Progress Notes (Signed)
Pharmacy Antibiotic Note  [name] is a 62 y.o. male admitted on 06/04/2018 with hematuria and suspected CAUTI.  PMH is significant for CVA, recent surgical drainage of gluteal abscess with osteomyelitis (currently on IV Invanz through PICC line), Foley catheter with frequency, pain, burning, cloudy and bloody urine.  Pharmacy has been consulted for Meropenem, vancomycin, and fluconazole dosing.  CT shows acute cholecystitis and small pericholecystic abscess, ongoing decubitus ulcer with probable osteomyelitis, and abscess in the adductor muscles of the proximal right thigh.  Diffuse urinary bladder wall thickening, suspicious for cystitis.  Today, 06/06/2018:  D2 full abx  Afebrile; normal WBC  SCr low but stable  ID to see today  Plan:  ID to see today; anticipate will narrow as appropriate  Continue meropenem and fluconazole as ordered  Given low SCr, will adjust vancomycin to 1250 mg IV q12 hr based on adjusted creatinine of 0.8 (AUC 460)  Daily SCr   Height: 6\' 1"  (185.4 cm) Weight: 195 lb (88.5 kg) IBW/kg (Calculated) : 79.9  Temp (24hrs), Avg:98 F (36.7 C), Min:97.6 F (36.4 C), Max:98.5 F (36.9 C)  Recent Labs  Lab 06/04/18 1525 06/04/18 1535 06/04/18 1745 06/05/18 0516 06/06/18 0411  WBC 7.1  --   --   --  7.4  CREATININE 0.58*  --   --  0.57* 0.53*  LATICACIDVEN  --  0.99 0.81  --   --     Estimated Creatinine Clearance: 108.2 mL/min (A) (by C-G formula based on SCr of 0.53 mg/dL (L)).    No Known Allergies  Antimicrobials this admission: Prevous admission: Vanc/Clinda/Zosyn >> Ertapenem >> 8/3 (planned end date 06/19/18) 8/3 Meropenem >>  8/3 Vancomycin >>  8/3 Fluconazole >>   Dose adjustments this admission: 8/5: reduced vanc to 1250 q12 using rounded SCr (CrCl 108 vs 163)  Microbiology results: 8/3 BCx: ngtd 8/3 UCx: NGF  Previous admission 7/8 Abscess Cxt:  multiple organisms present, Moderate citrobacter braakii, moderate viridans  strep, moderate bacteroides thetaiotacomicron, few Eikenella corrodens   Thank you for allowing pharmacy to be a part of this patient's care.  9/8, PharmD, BCPS [phone] 06/06/2018, 11:43 AM

## 2018-06-07 LAB — CREATININE, SERUM
CREATININE: 0.45 mg/dL — AB (ref 0.61–1.24)
GFR calc Af Amer: >60 mL/min (ref >60)
GFR calc non Af Amer: >60 mL/min (ref >60)

## 2018-06-07 MED ORDER — ERTAPENEM IV (FOR PTA / DISCHARGE USE ONLY): INTRAVENOUS | 0 refills | Status: AC

## 2018-06-07 MED ORDER — HYDROCODONE-ACETAMINOPHEN 10-325 MG PO TABS: 1.0000 | ORAL_TABLET | Freq: Three times a day (TID) | ORAL | 0 refills | Status: DC

## 2018-06-07 MED ORDER — ONDANSETRON HCL 4 MG PO TABS: 4.0000 mg | ORAL_TABLET | Freq: Four times a day (QID) | ORAL | 0 refills | Status: DC | PRN

## 2018-06-07 MED ORDER — ERTAPENEM IV (FOR PTA / DISCHARGE USE ONLY): 1.0000 g | INTRAVENOUS | 0 refills | Status: AC

## 2018-06-07 MED ORDER — HEPARIN SOD (PORK) LOCK FLUSH 100 UNIT/ML IV SOLN
250.0000 [IU] | INTRAVENOUS | Status: AC | PRN
  Administered 2018-06-07: 250 [IU]

## 2018-06-07 MED ORDER — TAMSULOSIN HCL 0.4 MG PO CAPS: 0.4000 mg | ORAL_CAPSULE | Freq: Every day | ORAL | 4 refills | Status: AC

## 2018-06-07 MED ORDER — MORPHINE SULFATE 30 MG PO TABS: 30.0000 mg | ORAL_TABLET | ORAL | 0 refills | Status: DC | PRN

## 2018-06-07 NOTE — Discharge Summary (Signed)
[name], is a 62 y.o. male  DOB 09-22-1956  MRN [id number].  Admission date:  06/04/2018  Admitting Physician  Merton Border, MD  Discharge Date:  06/07/2018   Primary MD  [name], PA-C  Recommendations for primary care physician for things to follow:   1)Give iv Invanz/Ertapenem 1 gm iv daily  Indication:  Wound infection Last Day of Therapy:  9/132019 Labs - Once weekly:  CBC/D and BMP, Labs - Every other week:  ESR and CRP  2) follow-up with wound clinic for possible wound VAC placement to right hip/buttock area wound  3) follow-up with urologist if any further blood  in the urine or if experiencing difficulty with urination  4)follow-up with Dr. Harlow Asa from general surgery,   5)follow-up with Dr. Johnnye Sima from infectious disease in 4 weeks   Admission Diagnosis  blood in catheter   Discharge Diagnosis  blood in catheter   Active Problems:   Cholecystitis   Complicated UTI (urinary tract infection)   Pressure injury of skin      Past Medical History:  Diagnosis Date  . AAA (abdominal aortic aneurysm) ([place])   . AKI (acute kidney injury) ([place])    a. Cr up to 8 in 04/2018.  . Arthritis   . CAD in native artery    a. h/o MI x 3 s/p CABG x 4 (1993 with [facility] Snare). b. LHC 01/2017 with severe native disease, 2 grafts occluded.  . Carotid artery disease ([place])    a. s/p R CEA 11/2016.  [name] Chronic combined systolic and diastolic CHF (congestive heart failure) ([place])   . GERD (gastroesophageal reflux disease)   . Hyperkalemia   . ILD (interstitial lung disease) ([place])   . Lung nodule   . MI (myocardial infarction) ([place])    x 3   . Normocytic anemia   . NSVT (nonsustained ventricular tachycardia) ([place])   . Pancreatitis    1/19  . Pneumonia 11/2017  . RA (rheumatoid arthritis) ([place])   . Stroke ([place])    a. 2018. b. possible recurrence in 04/2018, seen by neuro  . Tobacco abuse      Past Surgical History:  Procedure Laterality Date  . CARDIAC CATHETERIZATION    . CAROTID ENDARTERECTOMY Left   . Heart Bypass  10/1992  . INCISION AND DRAINAGE PERIRECTAL ABSCESS Right 05/09/2018   Procedure: IRRIGATION AND DEBRIDEMENT  RIGHT BUTTOCK ABSCESS;  Surgeon: Armandina Gemma, MD;  Location: [facility];  Service: General;  Laterality: Right;  . LEFT HEART CATH AND CORS/GRAFTS ANGIOGRAPHY N/A 03/01/2017   Procedure: Left Heart Cath and Cors/Grafts Angiography;  Surgeon: Belva Crome, MD;  Location: [facility];  Service: Cardiovascular;  Laterality: N/A;  . Throat Biopsy     Cat scratch fever       HPI  from the history and physical done on the day of admission:   HPI  [name]  is a 62 y.o. male, who has been recently admitted on 05/09/2018 for gluteal abscess and  osteomyelitis on the right, status post I&D by Dr. Harlow Asa, status post treatment with vancomycin  clindamycin and Zosyn and later on transitioned to ertapenem which he is still on, presenting with dysuria, hematuria and penile pain.  No history of fever chills nausea or vomiting .  CT of abdomen was consistent with acute cholecystitis and small pericholecystic abscess.  4.2 cm abscess in the abductor muscles of the proximal right thigh was noted , mildly decreased compared to the previous study.  He was also noted to have findings of cystitis and a stable 4.6 cm abdominal aortic aneurysm.  [name] was switched to meropenem to cover for resistant Pseudomonas and vancomycin and Diflucan were started.  Patient will be admitted to [facility] and surgery were consulted for the acute cholecystitis and the abscess noted in the proximal thigh.  Patient has a history of coronary artery disease status post CABG and interstitial lung disease with history of peripheral vascular disease status post carotid endarterectomy and history of pancreatitis    Hospital Course:     Brief Summary 62 y.o.male,who has been recently admitted  on 05/09/2018 for gluteal abscess and osteomyelitis on the right, status post I&D by Dr. Harlow Asa, status post treatment with vancomycin clindamycin and Zosyn and later on transitioned to ertapenem which he is still on,presenting with dysuria, hematuria and penile pain. No history of fever chills nausea or vomiting .CT of abdomen was consistent with acute cholecystitis and small pericholecystic abscess. 4.2 cm abscess in the abductor muscles of the proximal right thigh was noted ,mildly decreased compared to the previous study. He was also noted to have findings of cystitis and a stable 4.6 cm abdominal aortic aneurysm.  On admission on 8/3/2019Invanz was switched to meropenem to cover for resistant Pseudomonas , vancomycin was added to cover for MRSA and Diflucan was added, discussed with Dr. Drucilla Schmidt on 06/06/18 from infectious disease, recommends narrowing back down antibiotic to Invanz through PICC line   Plan:- 1)Rt Hip/Gluteal Area Decubitus Ulcers with Abscess/Osteomyelitis in the Adductor muscles of the proximal right thigh- stage IV ulcers  with osteomyelitis discussed with Dr. Drucilla Schmidt on 06/06/18 from infectious disease, recommends narrowing back down antibiotic to Invanz through PICC line, as per ID physician--- previous deep wound culture from 05/09/2018  grew multiple organisms including Eikenella corrodens, Citrobacter braakiii. Give iv Invanz/Ertapenem 1 gm iv daily  Last Day of Therapy:  07/15/2018, Labs - Once weekly:  CBC/D and BMP, Labs - Every other week:  ESR and CRP, follow-up with Dr. Harlow Asa from general surgery, follow-up with Dr. Johnnye Sima from infectious disease in 4 weeks  2)Follow-up with wound clinic for possible wound VAC placement to right hip/buttock area wound, continue IV Invanz as above  2)H/o CAD with Prior CABG/PAD with prior Carotid Endarterectomy/4.6 cm AAA-stable, no ACS type symptoms, continue Coreg 6.25 mg twice daily, Plavix 75 mg daily as well as Lipitor  3)CT  findings of possible cholecystitis--as per surgical team clinically patient does NOT have acute cholecystitis , patient is largely asymptomatic, surgical consult from Dr. Leighton Ruff noted, surgery does not plan any surgical intervention at this time as patient is asymptomatic  4)Possible Catheter Associated UTI---  blood and urine cultures from 06/04/2018 without growth, continue Invanz as advised by infectious disease, Ct abd shows Diffuse urinary bladder wall thickening, suspicious for cystitis.  5)Urinary Retention with hematuria--resolved with bladder irrigation, started on Flomax on 06/07/2018, urine cultures from 06/04/2018- to date, Foley catheter removed on 06/07/2018, if patient is unable to void will  have to reinsert Foley and have patient follow-up with urologist as outpatient. 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   Service:  Skilled Nursing Contact information: [address] [place] [phone]               Consults obtained - ID  Diet and Activity recommendation:  As advised  Discharge Instructions      Discharge Instructions    Call MD for:  difficulty breathing, headache or visual disturbances   Complete by:  As directed    Call MD for:  persistant dizziness or light-headedness   Complete by:  As directed    Call MD for:  persistant nausea and vomiting   Complete by:  As directed    Call MD for:  severe uncontrolled pain   Complete by:  As directed    Call MD for:  temperature >100.4   Complete by:  As directed    Diet - low sodium heart healthy   Complete by:  As directed    Discharge instructions   Complete by:  As directed    1)Give iv Invanz/Ertapenem 1 gm iv daily  Indication:  Wound infection Last Day of Therapy:  9/132019 Labs - Once weekly:  CBC/D and BMP, Labs - Every other week:  ESR and CRP  2) follow-up with wound clinic for possible wound VAC placement to right hip/buttock area wound  3) follow-up with urologist if any further blood  in the urine or if experiencing difficulty with urination  4)follow-up with Dr. Harlow Asa from general surgery,   5)follow-up with Dr. Johnnye Sima from infectious disease in 4 weeks   Home infusion instructions [facility] May follow [place] Dosing Protocol; May administer Cathflo as needed to maintain patency of vascular access device.; Flushing of vascular access device: per [facility] Protocol: 0.9% NaCl pre/post medica...   Complete by:  As directed    Instructions:  May follow [place] Dosing Protocol   Instructions:  May administer Cathflo as needed to maintain patency of vascular access device.   Instructions:  Flushing of vascular access device: per [facility] Protocol: 0.9% NaCl pre/post medication administration and prn patency; Heparin 100 u/ml, 54m for implanted ports and Heparin 10u/ml, 56mfor all other central venous catheters.   Instructions:  May follow [facility] Anaphylaxis Protocol for First Dose Administration in the home: 0.9% NaCl at 25-50 ml/hr to maintain IV access for protocol meds. Epinephrine 0.3 ml IV/IM PRN and  Benadryl 25-50 IV/IM PRN s/s of anaphylaxis.   Instructions:  [facility] Coordinator (RN) to assist per patient IV care needs in the home PRN.   Increase activity slowly   Complete by:  As directed  Discharge Medications     Allergies as of 06/07/2018   No Known Allergies     Medication List    STOP taking these medications   traMADol 50 MG tablet Commonly known as:  ULTRAM     TAKE these medications   acetaminophen 500 MG tablet Commonly known as:  TYLENOL Take 1,000 mg by mouth every 6 (six) hours as needed for mild pain.   atorvastatin 40 MG tablet Commonly known as:  LIPITOR Take 1 tablet (40 mg total) by mouth daily at 6 PM. What changed:  how much to take   carvedilol 6.25 MG tablet Commonly known as:  COREG Take 1 tablet (6.25 mg total) by mouth 2 (two) times daily with a meal.   clopidogrel 75 MG tablet Commonly known as:  PLAVIX Take 1 tablet (75 mg total) by mouth daily.   enoxaparin 40 MG/0.4ML injection Commonly known as:  LOVENOX Inject 0.4 mLs (40 mg total) into the skin daily.   ertapenem IVPB Commonly known as:  INVANZ Inject 1 g into the vein daily. Indication:  Gluteal abscess, abscess in right thigh adductors, pericholecytic abscess Last Day of Therapy:  07/15/18 Labs - Once weekly:  CBC/D and BMP, Labs - Every other week:  ESR and CRP What changed:  You were already taking a medication with the same name, and this prescription was added. Make sure you understand how and when to take each.   ertapenem IVPB Commonly known as:  INVANZ Invanz/Ertapenem 1 gm iv daily  Indication:  Wound infection Last Day of Therapy:  9/132019 Labs - Once weekly:  CBC/D and BMP, Labs - Every other week:  ESR and CRP What changed:    how much to take  how to take this  when to take this  additional instructions   feeding supplement (PRO-STAT SUGAR FREE 64) Liqd Take 30 mLs by mouth 2 (two) times daily.   gabapentin 300 MG  capsule Commonly known as:  NEURONTIN Take 1 capsule (300 mg total) by mouth every 8 (eight) hours.   HYDROcodone-acetaminophen 10-325 MG tablet Commonly known as:  NORCO Take 1 tablet by mouth 3 (three) times daily.   hydroxychloroquine 200 MG tablet Commonly known as:  PLAQUENIL Take 1 tablet (200 mg total) by mouth 2 (two) times daily. Start taking on:  06/22/2018   ipratropium-albuterol 0.5-2.5 (3) MG/3ML Soln Commonly known as:  DUONEB Take 3 mLs by nebulization 2 (two) times daily.   magnesium hydroxide 400 MG/5ML suspension Commonly known as:  MILK OF MAGNESIA Take 30 mLs by mouth daily as needed for mild constipation. And QHS   mirtazapine 7.5 MG tablet Commonly known as:  REMERON Take 7.5 mg by mouth at bedtime.   morphine 30 MG tablet Commonly known as:  MSIR Take 1 tablet (30 mg total) by mouth every 4 (four) hours as needed (breakthrough pain).   multivitamin with minerals Tabs tablet Take 1 tablet by mouth daily.   MUSCLE RUB EX Apply 1 application topically as needed (for shoulder or knee pain).   nicotine 7 mg/24hr patch Commonly known as:  NICODERM CQ - dosed in mg/24 hr Place 1 patch (7 mg total) onto the skin daily.   nutrition supplement (JUVEN) Pack Take 1 packet by mouth 2 (two) times daily between meals.   nystatin cream Commonly known as:  MYCOSTATIN Apply topically 2 (two) times daily.   ondansetron 4 MG tablet Commonly known as:  ZOFRAN Take 1 tablet (4 mg total) by mouth   every 6 (six) hours as needed for nausea.   polyethylene glycol packet Commonly known as:  MIRALAX / GLYCOLAX Take 17 g by mouth 2 (two) times daily. Until having 1 soft BM daily, then can decrease to daily or as needed What changed:    when to take this  additional instructions   potassium chloride SA 20 MEQ tablet Commonly known as:  K-DUR,KLOR-CON Take 2 tablets (40 mEq total) by mouth daily. What changed:  how much to take   predniSONE 10 MG tablet Commonly  known as:  DELTASONE Take 10 mg by mouth daily with breakfast.   albuterol (2.5 MG/3ML) 0.083% nebulizer solution Commonly known as:  PROVENTIL Take 2.5 mg by nebulization every 6 (six) hours as needed for wheezing or shortness of breath.   PROVENTIL HFA 108 (90 Base) MCG/ACT inhaler Generic drug:  albuterol INHALE 2 PUFFS EVERY 6 HOURS AS NEEDED FOR WHEEZE/SHORTNESS OF BREATH   saccharomyces boulardii 250 MG capsule Commonly known as:  FLORASTOR Take 1 capsule (250 mg total) by mouth 2 (two) times daily.   tamsulosin 0.4 MG Caps capsule Commonly known as:  FLOMAX Take 1 capsule (0.4 mg total) by mouth daily after supper.            Home Infusion Instuctions  (From admission, onward)        Start     Ordered   06/07/18 0000  Home infusion instructions [facility] May follow [facility] Dosing Protocol; May administer Cathflo as needed to maintain patency of vascular access device.; Flushing of vascular access device: per [facility] Protocol: 0.9% NaCl pre/post medica...    Question Answer Comment  Instructions May follow [facility] Dosing Protocol   Instructions May administer Cathflo as needed to maintain patency of vascular access device.   Instructions Flushing of vascular access device: per [facility] Protocol: 0.9% NaCl pre/post medication administration and prn patency; Heparin 100 u/ml, 5ml for implanted ports and Heparin 10u/ml, 5ml for all other central venous catheters.   Instructions May follow [facility] Anaphylaxis Protocol for First Dose Administration in the home: 0.9% NaCl at 25-50 ml/hr to maintain IV access for protocol meds. Epinephrine 0.3 ml IV/IM PRN and Benadryl 25-50 IV/IM PRN s/s of anaphylaxis.   Instructions [facility] Infusion Coordinator (RN) to assist per patient IV care needs in the home PRN.      06/07/18 1548      Major procedures and Radiology Reports - PLEASE review detailed and final reports for all details, in brief -    Dg Chest 2  View  Result Date: 06/04/2018 CLINICAL DATA:  Bleeding into catheter bag.  Wheezing. EXAM: CHEST - 2 VIEW COMPARISON:  05/09/2018 and 04/06/2018 radiographs.  CT 08/17/2017. FINDINGS: There is stable cardiomegaly status post median sternotomy and CABG. The upper sternotomy wires are chronically fractured. Right arm PICC extends to the level of the lower SVC. There is extensive chronic interstitial lung disease which appears slightly worse, possibly due to superimposed edema or inflammation. No confluent airspace opacity or significant pleural effusion. Old rib fractures are present on the left. IMPRESSION: Interval increased pulmonary opacities bilaterally, suspicious for edema or inflammation superimposed on chronic interstitial lung disease. Stable cardiomegaly. Electronically Signed   By: William  Veazey M.D.   On: 06/04/2018 16:11   Dg Chest 2 View  Result Date: 05/09/2018 CLINICAL DATA:  Fever. Buttock wound. History of interstitial lung disease. EXAM: CHEST - 2 VIEW COMPARISON:  Chest radiograph April 06, 2018 FINDINGS: Cardiac silhouette   is moderately enlarged unchanged. Status post median sternotomy for CABG with multiple fractured sternotomy wires. Similar chronic interstitial changes without pleural effusion or focal consolidation. No pneumothorax. Old LEFT rib fractures. IMPRESSION: 1. Stable interstitial prominence consistent with history of interstitial lung disease. 2. Stable cardiomegaly. Electronically Signed   By: Courtnay  Bloomer M.D.   On: 05/09/2018 14:46   Ct Pelvis W Contrast  Result Date: 05/09/2018 CLINICAL DATA:  Buttock pain and fever. Sepsis muscle likely from buttock/perineal and cellulitis. Concern for possible necrotizing fasciitis. EXAM: CT PELVIS WITH CONTRAST TECHNIQUE: Multidetector CT imaging of the pelvis was performed using the standard protocol following the bolus administration of intravenous contrast. CONTRAST:  100mL ISOVUE-300 IOPAMIDOL (ISOVUE-300) INJECTION 61%  COMPARISON:  08/08/2017 CT FINDINGS: Urinary Tract: Physiologic distention of the urinary bladder is noted. The distal ureters are unremarkable. Bowel: Normal appearing included appendix. Scattered colonic diverticulosis along the distal descending and sigmoid colon without acute diverticulitis. Mechanical bowel obstruction or inflammation. 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 Atherosclerosis with ectasia of the left common iliac artery 1.5 cm. No inguinal, pelvic sidewall, retroperitoneal or iliac chain adenopathy. Reproductive:  Normal size prostate and seminal vesicles. Other:  No free air nor free fluid. Musculoskeletal: Cellulitis of the medial right buttock with mottled peripherally enhancing soft tissue abscess or stigmata necrotizing fasciitis just caudad to the right ischium in the expected location of the distal right semimembranosus, semitendinosus, rectus femoris and quadratus femoris muscles and tendons. This collection contains mottled foci of gas and is estimated at 5.9 x 4.8 cm on series 3/130. The craniocaudad extent is difficult to ascertain on the reformats as it is incompletely included. Cortical bone loss is noted of the right ischial tuberosity is concerning for changes of osteomyelitis. IMPRESSION: 1. Abnormal enhancing soft tissue collection centered just caudad to the right ischial tuberosity measuring at least 5.9 x 4.7 cm hand AP by transverse dimension and involving the distal right semimembranosus, semitendinosus, rectus femoris and quadratus femoris muscles and tendons. This raises concern for soft tissue abscess and/or stigmata of necrotizing fasciitis. 2. Cortical bone loss of the right ischial tuberosity also raises concern changes of osteomyelitis. 3. 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 4. Ectasia of the left common iliac artery to 1.5 cm. Electronically Signed   By: David  Kwon M.D.   On:  05/09/2018 15:59   Ct Abdomen Pelvis W Contrast  Result Date: 06/04/2018 CLINICAL DATA:  Nausea and vomiting. Hematuria. Pyelonephritis. Pelvic decubitus ulcers. Abdominal aortic aneurysm. EXAM: CT ABDOMEN AND PELVIS WITH CONTRAST TECHNIQUE: Multidetector CT imaging of the abdomen and pelvis was performed using the standard protocol following bolus administration of intravenous contrast. CONTRAST:  100mL ISOVUE-300 IOPAMIDOL (ISOVUE-300) INJECTION 61% COMPARISON:  Pelvis CT on 05/09/2018 and AP CT on 08/08/2017 FINDINGS: Lower Chest: No acute findings. Stable bibasilar interstitial fibrosis and mild bronchiectasis. Hepatobiliary: No hepatic masses identified. The gallbladder is mildly dilated and demonstrates diffuse nodular wall thickening. There is mild adjacent edema in right hepatic lobe. A small pericholecystic fluid collection is also seen between the gallbladder and right hepatic lobe, with adjacent small defects in the gallbladder wall, consistent with contained perforation. These findings are highly suspicious for acute cholecystitis with pericholecystic abscess. No evidence of biliary ductal dilatation. Pancreas:  No mass or inflammatory changes. Spleen: Within normal limits in size and appearance. Adrenals/Urinary Tract: No masses identified. No evidence of hydronephrosis. A Foley catheter is seen within urinary bladder. Despite being nearly empty   gallbladder wall appears diffusely thickened and suspicious for cystitis. Stomach/Bowel: No evidence of obstruction, inflammatory process or abnormal fluid collections. Normal appendix visualized. Vascular/Lymphatic: No pathologically enlarged lymph nodes. Infrarenal abdominal aortic aneurysm is seen which measures 4.6 cm in maximum diameter, without significant change since previous study. No evidence of aneurysm leak or rupture. Reproductive:  No mass or other significant abnormality. Other:  No evidence of ascites or abscess. Musculoskeletal: A large deep  decubitus ulcer is again seen in the right buttock which extends to the right ischial tuberosity. Cortical thinning and irregularity is seen involving the right ischial tuberosity at this site, suspicious for osteomyelitis. A 4.2 cm rim enhancing fluid collection is seen in the adductor muscles of the proximal right thigh. This has decreased in size compared to approximately 5 cm on previous study, and is consistent with abscess. IMPRESSION: Findings consistent with acute cholecystitis and small pericholecystic abscess. Surgical consultation is recommended. 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 4.2 cm abscess in the adductor muscles of the proximal right thigh, mildly decreased in size compared with previous study. Diffuse urinary bladder wall thickening, suspicious for cystitis. Suggest correlation with urinalysis. Stable 4.6 cm abdominal aortic aneurysm. Recommend followup by abdomen and pelvis CTA in 6 months, and vascular surgery referral/consultation if not already obtained. This recommendation follows ACR consensus guidelines: White Paper of the ACR Incidental Findings Committee II on Vascular Findings. J Am Coll Radiol 2013; 10:789-794. Electronically Signed   By: Earle Gell M.D.   On: 06/04/2018 17:21   [place] Ekg Site Rite  Result Date: 05/16/2018 If Site Rite image not attached, placement could not be confirmed due to current cardiac rhythm.   Micro Results    Recent Results (from the past 240 hour(s))  Urine culture     Status: None   Collection Time: 06/04/18  3:25 PM  Result Value Ref Range Status   Specimen Description   Final    URINE, CATHETERIZED Performed at [facility] [address], [place], [place] [address]    Special Requests   Final    Normal Performed at [facility], [place] [address], [place], [place] [address]    Culture   Final    NO GROWTH Performed at [facility], [place] [address], [place], [place] [address]    Report Status 06/05/2018 FINAL  Final  Blood culture (routine x 2)     Status: None (Preliminary result)   Collection Time: 06/04/18  5:53 PM  Result Value Ref Range Status   Specimen Description BLOOD RIGHT ANTECUBITAL  Final   Special Requests   Final    BOTTLES DRAWN AEROBIC AND ANAEROBIC Blood Culture adequate volume Performed at [facility] [address] [place], [place] [address]    Culture   Final    NO GROWTH 3 DAYS Performed at [facility], [place] [address] [place], [place] [address]    Report Status PENDING  Incomplete  Blood culture (routine x 2)     Status: None (Preliminary result)   Collection Time: 06/04/18  5:55 PM  Result Value Ref Range Status   Specimen Description   Final    BLOOD RIGHT ANTECUBITAL Performed at [facility] [address] [place], [place] [address]    Special Requests   Final    BOTTLES DRAWN AEROBIC AND ANAEROBIC Blood Culture adequate volume Performed at [facility], [address] [place], [place] [address]    Culture   Final    NO GROWTH 3 DAYS Performed at [facility], [place] [address] [place], [place] [address]    Report Status PENDING  Incomplete       Today   Subjective    [name] today has no new complaints, and sister at bedside, questions answered          Patient has been seen and examined prior to discharge   Objective   Blood pressure 101/70, pulse 79, temperature 98 F (36.7 C), temperature source Oral, resp. rate 16, height 6' 1" (1.854 m), weight 88.5 kg (195 lb), SpO2 94 %.   Intake/Output Summary (Last 24 hours) at 06/07/2018 1611 Last data filed at 06/07/2018 1500 Gross per 24 hour  Intake 2003.03 ml  Output 3800 ml  Net -1796.97 ml    Exam  Gen:- Awake Alert,  In no apparent distress  HEENT:- [place].AT, No sclera icterus Neck-Supple Neck,No JVD,.  Lungs-  CTAB ,  good air movement bilaterally CV- S1, S2 normal , regular Abd-  +ve B.Sounds, Abd Soft, No tenderness,    Extremity-No  edema, good pulses Psych-affect is appropriate, oriented x3 Neuro-chronic neuromuscular deficits, however no new focal deficits, no tremors GU-Foley catheter removed on 06/07/2018 Skin:- Rt Gluteal/upper Thigh/Hip area--- with very large, deep, wound with packing and dressing over it, serosanguineous drainage noted     Data Review   CBC w Diff:  Lab Results  Component Value Date   WBC 7.4 06/06/2018   HGB 11.2 (L) 06/06/2018   HGB 14.4 09/14/2017   HCT 36.1 (L) 06/06/2018   HCT 42.4 09/14/2017   PLT 237 06/06/2018   PLT 178 09/14/2017   PLT 349 08/26/2017   LYMPHOPCT 42 06/04/2018   LYMPHOPCT 15.0 09/14/2017   BANDSPCT 2 05/15/2018   MONOPCT 9 06/04/2018   MONOPCT 6.6 09/14/2017   EOSPCT 3 06/04/2018   EOSPCT 0.1 09/14/2017   BASOPCT 0 06/04/2018   BASOPCT 0.1 09/14/2017    CMP:  Lab Results  Component Value Date   NA 141 06/06/2018   NA 142 02/08/2018   K 3.5 06/06/2018   CL 108 06/06/2018   CO2 25 06/06/2018   BUN 12 06/06/2018   BUN 12 02/08/2018   CREATININE 0.45 (L) 06/07/2018   PROT 5.9 (L) 06/06/2018   PROT 7.5 08/26/2017   ALBUMIN 2.6 (L) 06/06/2018   ALBUMIN 4.1 08/26/2017   BILITOT 1.3 (H) 06/06/2018   BILITOT 0.9 08/26/2017   ALKPHOS 93 06/06/2018   AST 15 06/06/2018   ALT 13 06/06/2018  .   Total Discharge time is about 33 minutes  [name] M.D on 06/07/2018 at 4:11 PM   Go to [email or web address] - password [email or web address] for contact info  [facility] - Office  [phone]

## 2018-06-07 NOTE — Progress Notes (Signed)
PHARMACY CONSULT NOTE FOR:   OUTPATIENT  PARENTERAL ANTIBIOTIC THERAPY (OPAT)  Indication: Gluteal abscess, abscess in right thigh adductors, pericholecytic abscess   Regimen: Ertapenem 1 gr IV q24h End date: 07/15/18  IV antibiotic discharge orders are pended. To discharging provider:  please sign these orders via discharge navigator,  Select New Orders & click on the button choice - Manage This Unsigned Work.     Thank you for allowing pharmacy to be a part of this patient's care.   Adalberto [name], PharmD, BCPS Pager [phone] 06/07/2018 10:43 AM

## 2018-06-07 NOTE — Progress Notes (Signed)
Report given to Tray, RN at [facility]. PTAR is scheduled for 1830. No questions or concerns at this time. RN will continue to monitor the patient.

## 2018-06-07 NOTE — Clinical Social Work Note (Signed)
Clinical Social Work Assessment  Patient Details  Name: [name] MRN: [id number] Date of Birth: 1956/03/08  Date of referral:  06/07/18               Reason for consult:  Facility Placement                Permission sought to share information with:  Facility Medical sales representative, Family Supports Permission granted to share information::     Name::     [employer]  Agency::     Relationship::  Sister  Contact Information:  [phone]  Housing/Transportation Living arrangements for the past 2 months:  Mobile Home, Skilled Nursing Facility Source of Information:    Patient Interpreter Needed:  None Criminal Activity/Legal Involvement Pertinent to Current Situation/Hospitalization:  No - Comment as needed Significant Relationships:  Siblings Lives with:  Facility Resident, Siblings Do you feel safe going back to the place where you live?  Yes Need for family participation in patient care:  Yes (Comment)  Care giving concerns:   No care giving concerns. Patient will return to [facility].   Social Worker assessment / plan:  Patient recently admitted to hospital 05/09/18 and was discharge to [facility] for continued IV antibiotics for Gluteal abscess, abscess in right thigh adductors, pericholecytic abscess.  Patient admitted this time for dysuria, hematuria and penile pain.   Patient will continue to ertapenem antibiotic at SNF. FL2 updated.  Plan: SNF  Employment status:  Disabled (Comment on whether or not currently receiving Disability) Insurance information:  Medicaid In Secretary PT Recommendations:  Not assessed at this time Information / Referral to community resources:  Skilled Nursing Facility  Patient/Family's Response to care:  Agreeable and Responding well to care.   Patient/Family's Understanding of and Emotional Response to Diagnosis, Current Treatment, and Prognosis:  Patient and his sister have a good understanding of current medical diagnosis and  continued follow up care at [facility].  Emotional Assessment Appearance:  Appears stated age Attitude/Demeanor/Rapport:    Affect (typically observed):  Appropriate Orientation:  Oriented to Self, Oriented to Place, Oriented to  Time, Oriented to Situation Alcohol / Substance use:  Not Applicable Psych involvement (Current and /or in the community):  No (Comment)  Discharge Needs  Concerns to be addressed:    Readmission within the last 30 days:  No Current discharge risk:  Physical Impairment Barriers to Discharge:  Continued Medical Work up   [employer], LCSW 06/07/2018, 11:15 AM

## 2018-06-07 NOTE — Progress Notes (Addendum)
Patient returnig to [facility].  D/C summary sent to facility via HUB Patient discharge packet complete.  PTAR to transport, once patient is medically stable.  Nurse call report to SNF- [phone]  Barriers to D/C: Waiting for the patient to void.   Patient voided. CSW arranged for PTAR at 6:30pm.   Vivi Barrack, [place], MSW Clinical Social Worker  [phone] 06/07/2018  4:15 PM

## 2018-06-07 NOTE — Discharge Instructions (Signed)
1)Give iv Invanz/Ertapenem 1 gm iv daily  Indication:  Wound infection Last Day of Therapy:  9/132019 Labs - Once weekly:  CBC/D and BMP, Labs - Every other week:  ESR and CRP  2) follow-up with wound clinic for possible wound VAC placement to right hip/buttock area wound  3) follow-up with urologist if any further blood  in the urine or if experiencing difficulty with urination  4)follow-up with Dr. Harlow Asa from general surgery,   5)follow-up with Dr. Johnnye Sima from infectious disease in 4 weeks

## 2018-06-07 NOTE — [place] FL2 (Addendum)
[place] MEDICAID FL2 LEVEL OF CARE SCREENING TOOL     IDENTIFICATION  Patient Name: [name] Birthdate: [birth year] Sex: male Admission Date (Current Location): 06/04/2018  [facility] and [place] Number:  Producer, television/film/video and Address:  [facility],  [address], [place] [address]      Provider Number: [id number]  Attending Physician Name and Address:  Shon Hale, MD  Relative Name and Phone Number:  Maretta Los [phone]    Current Level of Care: Hospital Recommended Level of Care: Skilled Nursing Facility Prior Approval Number:    Date Approved/Denied:   PASRR Number: [id number]  Discharge Plan: SNF    Current Diagnoses: Patient Active Problem List   Diagnosis Date Noted  . Pressure injury of skin 06/06/2018  . Complicated UTI (urinary tract infection)   . Cholecystitis 06/04/2018  . Severe sepsis (HCC)   . NSVT (nonsustained ventricular tachycardia) (HCC) 05/14/2018  . Adrenal insufficiency (HCC): Relative 05/11/2018  . Abscess of buttock 05/11/2018  . AKI (acute kidney injury) (HCC)   . Urinary retention   . Rheumatoid arthritis (HCC)   . Chronic obstructive pulmonary disease (HCC)   . Supplemental oxygen dependent   . Tobacco abuse   . AAA (abdominal aortic aneurysm) without rupture (HCC)   . History of CVA with residual deficit   . Acute blood loss anemia   . Hyperkalemia   . Cerebral thrombosis with cerebral infarction 04/11/2018  . Sacral ulcer, with unspecified severity (HCC) 04/05/2018  . Pressure injury of skin of sacral region   . Severe comorbid illness   . Acute renal failure (HCC)   . Shock (HCC) 04/04/2018  . High anion gap metabolic acidosis 04/04/2018  . Acute kidney injury (HCC) 04/04/2018  . Cervicalgia 01/18/2018  . Chronic pain of both shoulders 01/18/2018  . Hx of rheumatoid arthritis 01/18/2018  . Chronic pain syndrome 01/18/2018  . Leukocytosis 12/15/2017  . Cough   . Pneumonia 11/01/2017   . Elevated lipase   . Elevated liver enzymes   . Acute pancreatitis 08/08/2017  . Carotid artery disease (HCC) 05/12/2017  . 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 Chronic combined systolic and diastolic CHF (congestive heart failure) (HCC) 03/07/2017  . S/P CABG (coronary artery bypass graft)   . Pure hypercholesterolemia   . Acute combined systolic and diastolic heart failure (HCC)   . Exertional shortness of breath   . Solitary pulmonary nodule   . CAD (coronary artery disease) 02/24/2017  . ILD (interstitial lung disease) (HCC) 02/24/2017  . Anemia 02/24/2017  . AAA (abdominal aortic aneurysm) (HCC) 02/24/2017  . Abnormal EKG 02/24/2017  . Acute respiratory failure with hypoxia (HCC) 02/24/2017  . History of stroke 02/24/2017  . HTN (hypertension) 02/24/2017  . Current smoker 02/24/2017    Orientation RESPIRATION BLADDER Height & Weight     Self, Time, Situation, Place  Normal Incontinent/ indwelling catheter.  Weight: 195 lb (88.5 kg) Height:  6\' 1"  (185.4 cm)  BEHAVIORAL SYMPTOMS/MOOD NEUROLOGICAL BOWEL NUTRITION STATUS        Diet(Regular )  AMBULATORY STATUS COMMUNICATION OF NEEDS Skin   Extensive Assist Verbally Other (Comment)( Gluteal abscess, abscess in right thigh adductors, pericholecytic abscess)                       Personal Care Assistance Level of Assistance  Bathing, Feeding, Dressing Bathing Assistance: Maximum assistance Feeding assistance: Independent Dressing Assistance: Maximum assistance     Functional Limitations  Info  Sight, Hearing, Speech Sight Info: Adequate Hearing Info: Adequate Speech Info: Adequate    SPECIAL CARE FACTORS FREQUENCY  PT (By licensed PT), OT (By licensed OT)     PT Frequency: 5x/week OT Frequency: 5x/week            Contractures Contractures Info: Not present    Additional Factors Info  Code Status, Allergies Code Status Info: Fullcode  Allergies Info: NKA            Current Medications (06/07/2018):  This is the current hospital active medication list Current Facility-Administered Medications  Medication Dose Route Frequency Provider Last Rate Last Dose  . acetaminophen (TYLENOL) tablet 1,000 mg  1,000 mg Oral Q6H PRN Carron Curie, MD      . albuterol (PROVENTIL) (2.5 MG/3ML) 0.083% nebulizer solution 2.5 mg  2.5 mg Nebulization Q2H PRN Carron Curie, MD      . atorvastatin (LIPITOR) tablet 40 mg  40 mg Oral q1800 Emokpae, Courage, MD   40 mg at 06/06/18 1647  . carvedilol (COREG) tablet 6.25 mg  6.25 mg Oral BID WC Carron Curie, MD   6.25 mg at 06/07/18 0750  . clopidogrel (PLAVIX) tablet 75 mg  75 mg Oral Daily Carron Curie, MD   75 mg at 06/07/18 1027  . dextrose 5 %-0.45 % sodium chloride infusion   Intravenous Continuous Carron Curie, MD 50 mL/hr at 06/07/18 0332    . enoxaparin (LOVENOX) injection 40 mg  40 mg Subcutaneous Q24H Carron Curie, MD   40 mg at 06/06/18 2211  . ertapenem (INVANZ) 1,000 mg in sodium chloride 0.9 % 100 mL IVPB  1 g Intravenous Q24H Daiva Eves, Lisette Grinder, MD 200 mL/hr at 06/06/18 1648 1,000 mg at 06/06/18 1648  . feeding supplement (PRO-STAT SUGAR FREE 64) liquid 30 mL  30 mL Oral BID Carron Curie, MD   30 mL at 06/06/18 2211  . gabapentin (NEURONTIN) capsule 300 mg  300 mg Oral Q8H Carron Curie, MD   300 mg at 06/07/18 9735  . HYDROcodone-acetaminophen (NORCO) 10-325 MG per tablet 1 tablet  1 tablet Oral Q8H Carron Curie, MD   1 tablet at 06/07/18 [id number]  . ipratropium-albuterol (DUONEB) 0.5-2.5 (3) MG/3ML nebulizer solution 3 mL  3 mL Nebulization BID Carron Curie, MD   3 mL at 06/07/18 0810  . lidocaine (XYLOCAINE) 2 % jelly 1 application  1 application Urethral Once Carron Curie, MD      . magnesium hydroxide (MILK OF MAGNESIA) suspension 30 mL  30 mL Oral Daily PRN Carron Curie, MD      . mirtazapine (REMERON) tablet 7.5 mg  7.5 mg Oral QHS Carron Curie, MD   7.5 mg at 06/06/18 2211  . morphine 2 MG/ML injection 1 mg  1 mg Intravenous Q3H  PRN Carron Curie, MD   1 mg at 06/07/18 0147  . multivitamin with minerals tablet 1 tablet  1 tablet Oral Daily Carron Curie, MD   1 tablet at 06/07/18 1027  . nicotine (NICODERM CQ - dosed in mg/24 hr) patch 7 mg  7 mg Transdermal Daily Carron Curie, MD   7 mg at 06/07/18 1029  . nutrition supplement (JUVEN) (JUVEN) powder packet 1 packet  1 packet Oral BID BM Carron Curie, MD   1 packet at 06/06/18 [phone]  . ondansetron (ZOFRAN) tablet 4 mg  4 mg Oral Q6H PRN Carron Curie, MD       Or  . ondansetron (ZOFRAN) injection 4 mg  4 mg  Intravenous Q6H PRN Carron Curie, MD      . oxyCODONE (Oxy IR/ROXICODONE) immediate release tablet 5 mg  5 mg Oral Q4H PRN Carron Curie, MD      . polyethylene glycol (MIRALAX / GLYCOLAX) packet 17 g  17 g Oral BID Carron Curie, MD   17 g at 06/06/18 0939  . potassium chloride SA (K-DUR,KLOR-CON) CR tablet 40 mEq  40 mEq Oral Daily Carron Curie, MD   40 mEq at 06/07/18 1027  . predniSONE (DELTASONE) tablet 10 mg  10 mg Oral Q breakfast Carron Curie, MD   10 mg at 06/07/18 0750  . sodium chloride irrigation 0.9 % 3,000 mL  3,000 mL Bladder Irrigation Continuous Carron Curie, MD   3,000 mL at 06/05/18 0004     Discharge Medications: Please see discharge summary for a list of discharge medications.  Relevant Imaging Results:  Relevant Lab Results:   Additional Information SS#: [id number]/ Regimen: Ertapenem 1 gr IV q24h   Clearance Coots, LCSW

## 2018-06-08 ENCOUNTER — Ambulatory Visit: Payer: Medicaid Other | Admitting: Nurse Practitioner

## 2018-06-08 NOTE — Progress Notes (Signed)
PTAR in to pickup pt. Discharge packet provided. No incident noted.

## 2018-06-09 ENCOUNTER — Inpatient Hospital Stay: Payer: Medicaid Other | Admitting: Family

## 2018-06-09 LAB — CULTURE, BLOOD (ROUTINE X 2)
CULTURE: NO GROWTH
CULTURE: NO GROWTH
SPECIAL REQUESTS: ADEQUATE
Special Requests: ADEQUATE

## 2018-06-12 NOTE — Progress Notes (Signed)
LOU, LOEWE ([id number]) Visit Report for 06/01/2018 Chief Complaint Document Details Patient Name: [name], [name] Date of Service: 06/01/2018 8:00 AM Medical Record Number: [id number] Patient Account Number: [id number] Date of Birth/Sex: December 23, 1955 (62 y.o. M) Treating RN: Huel Coventry Primary Care Provider: Sindy Messing Other Clinician: Referring Provider: Wolfgang Phoenix Treating Provider/Extender: Altamese [place] in Treatment: 0 Information Obtained from: Patient Chief Complaint 06/01/18; patient is here for review of a very large wound over the right buttock and right ischial tuberosity region Electronic Signature(s) Signed: 06/01/2018 6:17:43 PM By: Baltazar Najjar MD Entered By: Baltazar Najjar on 06/01/2018 09:27:43 Greenfeld, [name] ([id number]) -------------------------------------------------------------------------------- Debridement Details Patient Name: [name] Date of Service: 06/01/2018 8:00 AM Medical Record Number: [id number] Patient Account Number: [id number] Date of Birth/Sex: 1956/08/01 (62 y.o. M) Treating RN: Huel Coventry Primary Care Provider: Sindy Messing Other Clinician: Referring Provider: Wolfgang Phoenix Treating Provider/Extender: Altamese [place] in Treatment: 0 Debridement Performed for Wound #1 Right Gluteus Assessment: Performed By: Physician Maxwell Caul, MD Debridement Type: Debridement Pre-procedure Verification/Time Yes - 09:07 Out Taken: Start Time: 09:07 Pain Control: Other : lidocaine 4% Total Area Debrided (L x W): 3 (cm) x 5 (cm) = 15 (cm) Tissue and other material Non-Viable, Muscle, Slough, Subcutaneous, Tendon, Slough debrided: Level: Skin/Subcutaneous Tissue/Muscle Debridement Description: Excisional Instrument: Blade, Forceps Bleeding: Moderate Hemostasis Achieved: Pressure End Time: 09:12 Response to Treatment: Procedure was tolerated well Level of Consciousness: Awake and Alert Post  Debridement Measurements of Total Wound Length: (cm) 19.3 Stage: Category/Stage IV Width: (cm) 8.1 Depth: (cm) 4.5 Volume: (cm) 552.516 Character of Wound/Ulcer Post Requires Further Debridement Debridement: Post Procedure Diagnosis Same as Pre-procedure Electronic Signature(s) Signed: 06/01/2018 6:17:43 PM By: Baltazar Najjar MD Signed: 06/03/2018 6:17:48 PM By: Elliot Gurney, BSN, RN, CWS, Kim RN, BSN Entered By: Baltazar Najjar on 06/01/2018 09:27:03 Isabell, [name] ([id number]) -------------------------------------------------------------------------------- HPI Details Patient Name: [name], LEX. Date of Service: 06/01/2018 8:00 AM Medical Record Number: [id number] Patient Account Number: [id number] Date of Birth/Sex: 1956-04-20 (62 y.o. M) Treating RN: Huel Coventry Primary Care Provider: Sindy Messing Other Clinician: Referring Provider: Wolfgang Phoenix Treating Provider/Extender: Altamese [place] in Treatment: 0 History of Present Illness HPI Description: ADMISSION 06/01/18 This is a 62 year old man who is sent to [place] currently residing at [facility] skilled facility. He has a complicated recent medical history. His sister accompanies him tells [place] that he has been somewhat disabled since suffering a left basal ganglial CVA in 2018. At that point in time he also had myocardial infarctions. More recently he has been hospitalized from 04/04/18 through 04/12/18 with septic shock, coronary artery disease, congestive heart failure and an acute CVA. During this hospitalization he was noted to have sacral wounds but these were not felt to be the source of infection however the source of the infection was never really determined at that point. I believe he was sent to a skilled facility. He reports that at the hospital on 05/09/18 through 05/18/18. At that point he was noted to have a large abscess of the right buttock. He required a surgical IandD by Dr. Gerrit Friends of general surgery.  Culture of this grew Citrobacter,Eikenella and Bacteroides. He had a CT scan of the pelvis that showed an abnormal soft tissue collection just caudal to the right ischial tuberosity measuring 5.9 and by 4.7. This also involved the distal right deep muscles. There was some cortical bone loss on the right ischial tuberosity and there was some concern with osteomyelitis. He  was felt to require treatment for osteomyelitis based on clinical and radiographic findings. He did not have an MRI. Past medical history includes; abdominal aortic aneurysm, carotid stenosis, hypertension, osteoarthritis, interstitial lung disease on chronic steroids, rheumatoid arthritis, congestive heart failure/ischemic cardiomyopathy with an ejection fraction of 35-40% Also noteworthy is on 05/09/18 his albumin was 2.2 on admission to hospital. I don't think this is actually been repeated. The patient states his appetite is good and he is taking supplements 100%. Electronic Signature(s) Signed: 06/01/2018 6:17:43 PM By: Baltazar Najjar MD Entered By: Baltazar Najjar on 06/01/2018 09:36:47 Scally, [name] ([id number]) -------------------------------------------------------------------------------- Physical Exam Details Patient Name: [name], WOOLSON. Date of Service: 06/01/2018 8:00 AM Medical Record Number: [id number] Patient Account Number: [id number] Date of Birth/Sex: 04/06/1956 (62 y.o. M) Treating RN: Huel Coventry Primary Care Provider: Sindy Messing Other Clinician: Referring Provider: Wolfgang Phoenix Treating Provider/Extender: Maxwell Caul Weeks in Treatment: 0 Constitutional Sitting or standing Blood Pressure is within target range for patient.. Pulse regular and within target range for patient.[name] Respirations regular, non-labored and within target range.. Temperature is normal and within the target range for the patient.[name] appears in no distress. Eyes ptosis right greater than left. Extraocular movements  are normal. Respiratory Respiratory effort is easy and symmetric bilaterally. Rate is normal at rest and on room air.. coarse crackles over both lower lung fields compatible with known ILD. Cardiovascular Heart rhythm and rate regular, without murmur or gallop.appears to be euvolemic. Gastrointestinal (GI) Abdomen is soft and non-distended without masses or tenderness. Bowel sounds active in all quadrants.. No liver or spleen enlargement or tenderness.. Genitourinary (GU) Foley catheter in place. Integumentary (Hair, Skin) no primary skin issues. there is no crepitus around the large wound no erythema. Neurological he has a right greater than left ptosis although he does not fatigue his extraocular movements are normal. Psychiatric No evidence of depression, anxiety, or agitation. Calm, cooperative, and communicative. Appropriate interactions and affect.. Notes wound exam; large wound over the right buttock with exposed ischial tuberosity. Moderate amount of tightly adherent necrotic subcutaneous tissue/likely muscle and perhaps some unattached tendon removed with a #10 scalpel and pickups. Hemostasis with direct pressure. There is definitely palpable bone. There is no surrounding soft tissue tenderness Electronic Signature(s) Signed: 06/01/2018 6:17:43 PM By: Baltazar Najjar MD Entered By: Baltazar Najjar on 06/01/2018 09:39:59 Abrahamsen, [name] ([id number]) -------------------------------------------------------------------------------- Physician Orders Details Patient Name: [name], [name] Date of Service: 06/01/2018 8:00 AM Medical Record Number: [id number] Patient Account Number: [id number] Date of Birth/Sex: 02/11/1956 (62 y.o. M) Treating RN: Huel Coventry Primary Care Provider: Sindy Messing Other Clinician: Referring Provider: Wolfgang Phoenix Treating Provider/Extender: Altamese [place] in Treatment: 0 Verbal / Phone Orders: No Diagnosis Coding Wound  Cleansing Wound #1 Right Gluteus o Clean wound with Normal Saline. o Cleanse wound with mild soap and water Anesthetic (add to Medication List) Wound #1 Right Gluteus o Topical Lidocaine 4% cream applied to wound bed prior to debridement (In Clinic Only). o Benzocaine Topical Anesthetic Spray applied to wound bed prior to debridement (In Clinic Only). Skin Barriers/Peri-Wound Care Wound #1 Right Gluteus o Skin Prep Primary Wound Dressing Wound #1 Right Gluteus o Saline moistened gauze - in clinic and until wound vac is available (Moisten with hydrogel) Secondary Dressing Wound #1 Right Gluteus o ABD pad - 3rd layer o Dry Gauze - 2nd layer o Other - secure with Tegaderm or tape Dressing Change Frequency Wound #1 Right Gluteus o Change dressing every day. Follow-up Appointments Wound #1  Right Gluteus o Return Appointment in 1 week. Off-Loading Wound #1 Right Gluteus o Mattress - Air mattress o Turn and reposition every 2 hours Additional Orders / Instructions Wound #1 Right Gluteus o Increase protein intake. o Other: - Pro-stat for wound healing twice daily. NYAIRE, DENBLEYKER W. ([id number]) Negative Pressure Wound Therapy Wound #1 Right Gluteus o Wound VAC settings at 125/130 mmHg continuous pressure. Use BLACK/GREEN foam to wound cavity. Use WHITE foam to fill any tunnel/s and/or undermining. Change VAC dressing 3 X WEEK. Change canister as indicated when full. Nurse may titrate settings and frequency of dressing changes as clinically indicated. [facility] to order; white foam over bone o Home Health Nurse may d/c VAC for s/s of increased infection, significant wound regression, or uncontrolled drainage. [facility] at [phone]. Electronic Signature(s) Signed: 06/01/2018 6:17:43 PM By: Baltazar Najjar MD Signed: 06/03/2018 6:17:48 PM By: Elliot Gurney, BSN, RN, CWS, Kim RN, BSN Entered By: Elliot Gurney, BSN, RN, CWS, Kim on  06/01/2018 09:22:48 ALEXES, LAMARQUE ([id number]) -------------------------------------------------------------------------------- Problem List Details Patient Name: [name], [name] Date of Service: 06/01/2018 8:00 AM Medical Record Number: [id number] Patient Account Number: [id number] Date of Birth/Sex: Feb 11, 1956 (62 y.o. M) Treating RN: Huel Coventry Primary Care Provider: Sindy Messing Other Clinician: Referring Provider: Wolfgang Phoenix Treating Provider/Extender: Altamese [place] in Treatment: 0 Active Problems ICD-10 Evaluated Encounter Code Description Active Date Today Diagnosis L89.314 Pressure ulcer of right buttock, stage 4 06/01/2018 No Yes M86.18 Other acute osteomyelitis, other site 06/01/2018 No Yes T81.31XD Disruption of external operation (surgical) wound, not 06/01/2018 No Yes elsewhere classified, subsequent encounter E43 Unspecified severe protein-calorie malnutrition 06/01/2018 No Yes Inactive Problems Resolved Problems Electronic Signature(s) Signed: 06/01/2018 6:17:43 PM By: Baltazar Najjar MD Entered By: Baltazar Najjar on 06/01/2018 16:60:63 [name] ([id number]) -------------------------------------------------------------------------------- Progress Note Details Patient Name: [name] Date of Service: 06/01/2018 8:00 AM Medical Record Number: [id number] Patient Account Number: [id number] Date of Birth/Sex: Nov 19, 1955 (62 y.o. M) Treating RN: Huel Coventry Primary Care Provider: Sindy Messing Other Clinician: Referring Provider: Wolfgang Phoenix Treating Provider/Extender: Altamese [place] in Treatment: 0 Subjective Chief Complaint Information obtained from Patient 06/01/18; patient is here for review of a very large wound over the right buttock and right ischial tuberosity region History of Present Illness (HPI) ADMISSION 06/01/18 This is a 62 year old man who is sent to [place] currently residing at [facility] skilled  facility. He has a complicated recent medical history. His sister accompanies him tells [place] that he has been somewhat disabled since suffering a left basal ganglial CVA in 2018. At that point in time he also had myocardial infarctions. More recently he has been hospitalized from 04/04/18 through 04/12/18 with septic shock, coronary artery disease, congestive heart failure and an acute CVA. During this hospitalization he was noted to have sacral wounds but these were not felt to be the source of infection however the source of the infection was never really determined at that point. I believe he was sent to a skilled facility. He reports that at the hospital on 05/09/18 through 05/18/18. At that point he was noted to have a large abscess of the right buttock. He required a surgical IandD by Dr. Gerrit Friends of general surgery. Culture of this grew Citrobacter,Eikenella and Bacteroides. He had a CT scan of the pelvis that showed an abnormal soft tissue collection just caudal to the right ischial tuberosity measuring 5.9 and by 4.7. This also involved the distal right deep muscles. There  was some cortical bone loss on the right ischial tuberosity and there was some concern with osteomyelitis. He was felt to require treatment for osteomyelitis based on clinical and radiographic findings. He did not have an MRI. Past medical history includes; abdominal aortic aneurysm, carotid stenosis, hypertension, osteoarthritis, interstitial lung disease on chronic steroids, rheumatoid arthritis, congestive heart failure/ischemic cardiomyopathy with an ejection fraction of 35-40% Also noteworthy is on 05/09/18 his albumin was 2.2 on admission to hospital. I don't think this is actually been repeated. The patient states his appetite is good and he is taking supplements 100%. Patient History Allergies No Known Allergies Social History Former smoker - stopped Jan. 2018, Marital Status - Single, Alcohol Use - Rarely, Drug Use - No  History, Caffeine Use - Daily. Medical History Eyes Patient has history of Cataracts Denies history of Glaucoma, Optic Neuritis Ear/Nose/Mouth/Throat Denies history of Chronic sinus problems/congestion, Middle ear problems Hematologic/Lymphatic Denies history of Anemia, Hemophilia, Human Immunodeficiency Virus, Lymphedema, Sickle Cell Disease Simmer, Verlie W. ([id number]) Respiratory Denies history of Aspiration, Asthma, Chronic Obstructive Pulmonary Disease (COPD), Pneumothorax, Sleep Apnea, Tuberculosis Cardiovascular Patient has history of Congestive Heart Failure, Coronary Artery Disease, Hypertension, Myocardial Infarction Denies history of Angina, Arrhythmia, Deep Vein Thrombosis, Hypotension, Peripheral Arterial Disease, Peripheral Venous Disease, Phlebitis, Vasculitis Gastrointestinal Denies history of Cirrhosis , Colitis, Crohn s, Hepatitis A, Hepatitis B, Hepatitis C Endocrine Denies history of Type I Diabetes, Type II Diabetes Genitourinary Denies history of End Stage Renal Disease Immunological Denies history of Lupus Erythematosus, Raynaud s, Scleroderma Integumentary (Skin) Patient has history of History of pressure wounds Denies history of History of Burn Musculoskeletal Patient has history of Rheumatoid Arthritis Denies history of Gout, Osteoarthritis, Osteomyelitis Neurologic Denies history of Dementia, Neuropathy, Quadriplegia, Paraplegia, Seizure Disorder Oncologic Denies history of Received Chemotherapy, Received Radiation Psychiatric Denies history of Anorexia/bulimia, Confinement Anxiety Medical And Surgical History Notes Cardiovascular Nov 02 2016 had stroke and 5 heart attacks in same day Review of Systems (ROS) Constitutional Symptoms (General Health) Denies complaints or symptoms of Fatigue, Fever, Chills. Eyes Complains or has symptoms of Glasses / Contacts - glasses. Denies complaints or symptoms of Dry Eyes, Vision  Changes. Ear/Nose/Mouth/Throat Denies complaints or symptoms of Difficult clearing ears, Sinusitis. Hematologic/Lymphatic Denies complaints or symptoms of Bleeding / Clotting Disorders, Human Immunodeficiency Virus. Respiratory Complains or has symptoms of Shortness of Breath - with exertion. Denies complaints or symptoms of Chronic or frequent coughs. Cardiovascular Denies complaints or symptoms of Chest pain, LE edema. Gastrointestinal Denies complaints or symptoms of Frequent diarrhea, Nausea, Vomiting. Endocrine Denies complaints or symptoms of Hepatitis, Thyroid disease, Polydypsia (Excessive Thirst). Genitourinary Denies complaints or symptoms of Kidney failure/ Dialysis, Incontinence/dribbling. Immunological Denies complaints or symptoms of Hives, Itching. Integumentary (Skin) Complains or has symptoms of Wounds, Bleeding or bruising tendency, Breakdown. Denies complaints or symptoms of Swelling. Musculoskeletal Complains or has symptoms of Muscle Pain. DAVED, MCFANN. ([id number]) Denies complaints or symptoms of Muscle Weakness. Neurologic Denies complaints or symptoms of Numbness/parasthesias, Focal/Weakness. Oncologic The patient has no complaints or symptoms. Psychiatric Denies complaints or symptoms of Anxiety, Claustrophobia. Objective Constitutional Sitting or standing Blood Pressure is within target range for patient.. Pulse regular and within target range for patient.[name] Respirations regular, non-labored and within target range.. Temperature is normal and within the target range for the patient.[name] appears in no distress. Vitals Time Taken: 8:27 AM, Height: 73 in, Source: Stated, Weight: 206 lbs, Source: Stated, BMI: 27.2, Temperature: 97.8 F, Pulse: 82 bpm, Respiratory Rate: 18 breaths/min, Blood Pressure: 110/69 mmHg.  Eyes ptosis right greater than left. Extraocular movements are normal. Respiratory Respiratory effort is easy and symmetric bilaterally. Rate  is normal at rest and on room air.. coarse crackles over both lower lung fields compatible with known ILD. Cardiovascular Heart rhythm and rate regular, without murmur or gallop.appears to be euvolemic. Gastrointestinal (GI) Abdomen is soft and non-distended without masses or tenderness. Bowel sounds active in all quadrants.. No liver or spleen enlargement or tenderness.. Genitourinary (GU) Foley catheter in place. Neurological he has a right greater than left ptosis although he does not fatigue his extraocular movements are normal. Psychiatric No evidence of depression, anxiety, or agitation. Calm, cooperative, and communicative. Appropriate interactions and affect.. General Notes: wound exam; large wound over the right buttock with exposed ischial tuberosity. Moderate amount of tightly adherent necrotic subcutaneous tissue/likely muscle and perhaps some unattached tendon removed with a #10 scalpel and pickups. Hemostasis with direct pressure. There is definitely palpable bone. There is no surrounding soft tissue tenderness Integumentary (Hair, Skin) no primary skin issues. there is no crepitus around the large wound no erythema. Wound #1 status is Open. Original cause of wound was Gradually Appeared. The wound is located on the Right Gluteus. The EKAM, BESSON. ([id number]) wound measures 19.3cm length x 8.1cm width x 4.4cm depth; 122.781cm^2 area and 540.238cm^3 volume. There is bone, muscle, Fat Layer (Subcutaneous Tissue) Exposed, and fascia exposed. There is no tunneling or undermining noted. There is a large amount of serosanguineous drainage noted. The wound margin is distinct with the outline attached to the wound base. There is large (67-100%) red, pink granulation within the wound bed. There is a small (1-33%) amount of necrotic tissue within the wound bed including Adherent Slough. The periwound skin appearance did not exhibit: Callus, Crepitus, Excoriation, Induration, Rash,  Scarring, Dry/Scaly, Maceration, Atrophie Blanche, Cyanosis, Ecchymosis, Hemosiderin Staining, Mottled, Pallor, Rubor, Erythema. Periwound temperature was noted as No Abnormality. The periwound has tenderness on palpation. Assessment Active Problems ICD-10 Pressure ulcer of right buttock, stage 4 Other acute osteomyelitis, other site Disruption of external operation (surgical) wound, not elsewhere classified, subsequent encounter Unspecified severe protein-calorie malnutrition Procedures Wound #1 Pre-procedure diagnosis of Wound #1 is a Pressure Ulcer located on the Right Gluteus . There was a Excisional Skin/Subcutaneous Tissue/Muscle Debridement with a total area of 15 sq cm performed by Maxwell Caul, MD. With the following instrument(s): Blade, and Forceps to remove Non-Viable tissue/material. Material removed includes Muscle, Tendon, Subcutaneous Tissue, and Slough after achieving pain control using Other (lidocaine 4%). No specimens were taken. A time out was conducted at 09:07, prior to the start of the procedure. A Moderate amount of bleeding was controlled with Pressure. The procedure was tolerated well. Patient s Level of Consciousness post procedure was recorded as Awake and Alert. Post Debridement Measurements: 19.3cm length x 8.1cm width x 4.5cm depth; 552.516cm^3 volume. Post debridement Stage noted as Category/Stage IV. Character of Wound/Ulcer Post Debridement requires further debridement. Post procedure Diagnosis Wound #1: Same as Pre-Procedure Plan Wound Cleansing: Wound #1 Right Gluteus: Clean wound with Normal Saline. Cleanse wound with mild soap and water Anesthetic (add to Medication List): Wound #1 Right Gluteus: Topical Lidocaine 4% cream applied to wound bed prior to debridement (In Clinic Only). Benzocaine Topical Anesthetic Spray applied to wound bed prior to debridement (In Clinic Only). Skin Barriers/Peri-Wound Care: Wound #1 Right Gluteus: MELO, STAUBER. ([id number]) Skin Prep Primary Wound Dressing: Wound #1 Right Gluteus: Saline moistened gauze - in clinic and until wound vac is available (  Moisten with hydrogel) Secondary Dressing: Wound #1 Right Gluteus: ABD pad - 3rd layer Dry Gauze - 2nd layer Other - secure with Tegaderm or tape Dressing Change Frequency: Wound #1 Right Gluteus: Change dressing every day. Follow-up Appointments: Wound #1 Right Gluteus: Return Appointment in 1 week. Off-Loading: Wound #1 Right Gluteus: Mattress - Air mattress Turn and reposition every 2 hours Additional Orders / Instructions: Wound #1 Right Gluteus: Increase protein intake. Other: - Pro-stat for wound healing twice daily. Negative Pressure Wound Therapy: Wound #1 Right Gluteus: Wound VAC settings at 125/130 mmHg continuous pressure. Use BLACK/GREEN foam to wound cavity. Use WHITE foam to fill any tunnel/s and/or undermining. Change VAC dressing 3 X WEEK. Change canister as indicated when full. Nurse may titrate settings and frequency of dressing changes as clinically indicated. [facility] to order; white foam over bone Home Health Nurse may d/c VAC for s/s of increased infection, significant wound regression, or uncontrolled drainage. Notify [facility] at [phone]. #1 the facility is currently using normal saline wet to dry dressings and I think they're changing this to her 3 times daily. I think this can be changed to hydrogel wet to dry unchanged less frequently perhaps daily. #2 there is drainage apparently but I saw no evidence of infection. Extensive debridement, this is clearly down to the right ischial tuberosity itself. #3 I think there is no doubt that this patient could benefit from a wound VAC. He has been on IV antibiotics now probably for at least 2 weeks and I think that should allay any concern about ongoing infection area #4 we have written these orders to the nursing home where he  currently resides #5 severe protein calorie malnutrition on his last presentation Hospital with an albumin of 2.2. He tells me he is eating 100% and taking 100% of his supplements. I think this probably should be rechecked in the next week to 2. He probably is doing better however if the albumin remains in this area even with the wound VAC the chances of closing this would be markedly reduced Electronic Signature(s) Signed: 06/01/2018 6:17:43 PM By: Baltazar Najjar MD Entered By: Baltazar Najjar on 06/01/2018 09:42:00 Mally, [name] ([id number]) -------------------------------------------------------------------------------- ROS/PFSH Details Patient Name: [name] Date of Service: 06/01/2018 8:00 AM Medical Record Number: [id number] Patient Account Number: [id number] Date of Birth/Sex: 27-Jun-1956 (62 y.o. M) Treating RN: Renne Crigler Primary Care Provider: Sindy Messing Other Clinician: Referring Provider: Wolfgang Phoenix Treating Provider/Extender: Maxwell Caul Weeks in Treatment: 0 Wound History Constitutional Symptoms (General Health) Complaints and Symptoms: Negative for: Fatigue; Fever; Chills Eyes Complaints and Symptoms: Positive for: Glasses / Contacts - glasses Negative for: Dry Eyes; Vision Changes Medical History: Positive for: Cataracts Negative for: Glaucoma; Optic Neuritis Ear/Nose/Mouth/Throat Complaints and Symptoms: Negative for: Difficult clearing ears; Sinusitis Medical History: Negative for: Chronic sinus problems/congestion; Middle ear problems Hematologic/Lymphatic Complaints and Symptoms: Negative for: Bleeding / Clotting Disorders; Human Immunodeficiency Virus Medical History: Negative for: Anemia; Hemophilia; Human Immunodeficiency Virus; Lymphedema; Sickle Cell Disease Respiratory Complaints and Symptoms: Positive for: Shortness of Breath - with exertion Negative for: Chronic or frequent coughs Medical History: Negative for:  Aspiration; Asthma; Chronic Obstructive Pulmonary Disease (COPD); Pneumothorax; Sleep Apnea; Tuberculosis Cardiovascular Complaints and Symptoms: Negative for: Chest pain; LE edema Medical History: Positive for: Congestive Heart Failure; Coronary Artery Disease; Hypertension; Myocardial Infarction Negative for: Angina; Arrhythmia; Deep Vein Thrombosis; Hypotension; Peripheral Arterial Disease; Peripheral Venous Disease; Phlebitis; Vasculitis [facility]. ([id number]) Past Medical History Notes:  Nov 02 2016 had stroke and 5 heart attacks in same day Gastrointestinal Complaints and Symptoms: Negative for: Frequent diarrhea; Nausea; Vomiting Medical History: Negative for: Cirrhosis ; Colitis; Crohnos; Hepatitis A; Hepatitis B; Hepatitis C Endocrine Complaints and Symptoms: Negative for: Hepatitis; Thyroid disease; Polydypsia (Excessive Thirst) Medical History: Negative for: Type I Diabetes; Type II Diabetes Genitourinary Complaints and Symptoms: Negative for: Kidney failure/ Dialysis; Incontinence/dribbling Medical History: Negative for: End Stage Renal Disease Immunological Complaints and Symptoms: Negative for: Hives; Itching Medical History: Negative for: Lupus Erythematosus; Raynaudos; Scleroderma Integumentary (Skin) Complaints and Symptoms: Positive for: Wounds; Bleeding or bruising tendency; Breakdown Negative for: Swelling Medical History: Positive for: History of pressure wounds Negative for: History of Burn Musculoskeletal Complaints and Symptoms: Positive for: Muscle Pain Negative for: Muscle Weakness Medical History: Positive for: Rheumatoid Arthritis Negative for: Gout; Osteoarthritis; Osteomyelitis Neurologic Complaints and Symptoms: Negative for: Numbness/parasthesias; Focal/Weakness [place], [place]. ([id number]) Medical History: Negative for: Dementia; Neuropathy; Quadriplegia; Paraplegia; Seizure Disorder Psychiatric Complaints and  Symptoms: Negative for: Anxiety; Claustrophobia Medical History: Negative for: Anorexia/bulimia; Confinement Anxiety Oncologic Complaints and Symptoms: No Complaints or Symptoms Medical History: Negative for: Received Chemotherapy; Received Radiation HBO Extended History Items Eyes: Cataracts Immunizations Pneumococcal Vaccine: Received Pneumococcal Vaccination: Yes Tetanus Vaccine: Last tetanus shot: 11/02/2016 Implantable Devices Family and Social History Former smoker - stopped Jan. 2018; Marital Status - Single; Alcohol Use: Rarely; Drug Use: No History; Caffeine Use: Daily; Financial Concerns: No; Food, Clothing or Shelter Needs: No; Support System Lacking: No; Transportation Concerns: No; Advanced Directives: No; Patient does not want information on Advanced Directives; Do not resuscitate: No; Living Will: No; Medical Power of Attorney: Yes - Kathey Hopins- Sister (Not Provided) Electronic Signature(s) Signed: 06/01/2018 5:09:20 PM By: Renne Crigler Signed: 06/01/2018 6:17:43 PM By: Baltazar Najjar MD Entered By: Renne Crigler on 06/01/2018 08:41:36 Minks, Derwin W. ([id number]) -------------------------------------------------------------------------------- SuperBill Details Patient Name: MASLEY, [name] Date of Service: 06/01/2018 Medical Record Number: [id number] Patient Account Number: [id number] Date of Birth/Sex: 1956-06-17 (62 y.o. M) Treating RN: Huel Coventry Primary Care Provider: Sindy Messing Other Clinician: Referring Provider: Wolfgang Phoenix Treating Provider/Extender: Altamese [place] in Treatment: 0 Diagnosis Coding ICD-10 Codes Code Description L89.314 Pressure ulcer of right buttock, stage 4 M86.18 Other acute osteomyelitis, other site T81.31XD Disruption of external operation (surgical) wound, not elsewhere classified, subsequent encounter E43 Unspecified severe protein-calorie malnutrition Facility Procedures CPT4 Code:  08676195 Description: 99213 - WOUND CARE VISIT-LEV 3 EST PT Modifier: Quantity: 1 CPT4 Code: 09326712 Description: 11043 - DEB MUSC/FASCIA 20 SQ CM/< ICD-10 Diagnosis Description L89.314 Pressure ulcer of right buttock, stage 4 Modifier: Quantity: 1 Physician Procedures CPT4: Description Modifier Quantity Code 4580998 99204 - WC PHYS LEVEL 4 - NEW PT 25 1 ICD-10 Diagnosis Description L89.314 Pressure ulcer of right buttock, stage 4 M86.18 Other acute osteomyelitis, other site T81.31XD Disruption of external operation  (surgical) wound, not elsewhere classified, subsequent encounter E43 Unspecified severe protein-calorie malnutrition CPT4: 3382505 11043 - WC PHYS DEBR MUSCLE/FASCIA 20 SQ CM 1 ICD-10 Diagnosis Description L89.314 Pressure ulcer of right buttock, stage 4 Electronic Signature(s) Signed: 06/01/2018 6:17:43 PM By: Baltazar Najjar MD Entered By: Baltazar Najjar on 06/01/2018 09:43:35

## 2018-06-12 NOTE — Progress Notes (Signed)
MEILECH, VIRTS ([id number]) Visit Report for 06/01/2018 Allergy List Details Patient Name: LESTER, CRICKENBERGER. Date of Service: 06/01/2018 8:00 AM Medical Record Number: [id number] Patient Account Number: [id number] Date of Birth/Sex: [birth year] (62 y.o. M) Treating RN: Renne Crigler Primary Care Dicy Smigel: Sindy Messing Other Clinician: Referring Mimi Debellis: Wolfgang Phoenix Treating Mubashir Mallek/Extender: Maxwell Caul Weeks in Treatment: 0 Allergies Active Allergies No Known Allergies Allergy Notes Electronic Signature(s) Signed: 06/01/2018 5:09:20 PM By: Renne Crigler Entered By: Renne Crigler on 06/01/2018 08:30:03 Trigg, [name] ([id number]) -------------------------------------------------------------------------------- Arrival Information Details Patient Name: [name]. Date of Service: 06/01/2018 8:00 AM Medical Record Number: [id number] Patient Account Number: [id number] Date of Birth/Sex: 1956-09-20 (62 y.o. M) Treating RN: Renne Crigler Primary Care Omer Monter: Sindy Messing Other Clinician: Referring Averyanna Sax: Wolfgang Phoenix Treating Nijee Heatwole/Extender: Altamese [place] in Treatment: 0 Visit Information Patient Arrived: Stretcher Arrival Time: 08:26 Accompanied By: siser Transfer Assistance: None Patient Identification Verified: Yes Secondary Verification Process Completed: Yes Electronic Signature(s) Signed: 06/01/2018 5:09:20 PM By: Renne Crigler Entered By: Renne Crigler on 06/01/2018 08:26:50 Levick, Jostin Lacretia Nicks ([id number]) -------------------------------------------------------------------------------- Clinic Level of Care Assessment Details Patient Name: [name]. Date of Service: 06/01/2018 8:00 AM Medical Record Number: [id number] Patient Account Number: [id number] Date of Birth/Sex: 15-Jun-1956 (62 y.o. M) Treating RN: Huel Coventry Primary Care Weyman Bogdon: Sindy Messing Other Clinician: Referring Weslie Rasmus: Wolfgang Phoenix Treating Bruce Mayers/Extender: Altamese [place] in Treatment: 0 Clinic Level of Care Assessment Items TOOL 1 Quantity Score []  - Use when EandM and Procedure is performed on INITIAL visit 0 ASSESSMENTS - Nursing Assessment / Reassessment X - General Physical Exam (combine w/ comprehensive assessment (listed just below) when 1 20 performed on new pt. evals) X- 1 25 Comprehensive Assessment (HX, ROS, Risk Assessments, Wounds Hx, etc.) ASSESSMENTS - Wound and Skin Assessment / Reassessment []  - Dermatologic / Skin Assessment (not related to wound area) 0 ASSESSMENTS - Ostomy and/or Continence Assessment and Care []  - Incontinence Assessment and Management 0 []  - 0 Ostomy Care Assessment and Management (repouching, etc.) PROCESS - Coordination of Care X - Simple Patient / Family Education for ongoing care 1 15 []  - 0 Complex (extensive) Patient / Family Education for ongoing care X- 1 10 Staff obtains , Records, Test Results / Process Orders []  - 0 Staff telephones HHA, Nursing Homes / Clarify orders / etc []  - 0 Routine Transfer to another Facility (non-emergent condition) []  - 0 Routine Hospital Admission (non-emergent condition) X- 1 15 New Admissions / / Ordering NPWT, Apligraf, etc. []  - 0 Emergency Hospital Admission (emergent condition) PROCESS - Special Needs []  - Pediatric / Minor Patient Management 0 []  - 0 Isolation Patient Management []  - 0 Hearing / Language / Visual special needs []  - 0 Assessment of Community assistance (transportation, D/C planning, etc.) []  - 0 Additional assistance / Altered mentation []  - 0 Support Surface(s) Assessment (bed, cushion, seat, etc.) Caban, Quincey W. ( ) INTERVENTIONS - Miscellaneous []  - External ear exam 0 X- 1 10 Patient Transfer (multiple staff / / Similar devices) []  - 0 Simple Staple / Suture removal (25 or less) []  - 0 Complex Staple / Suture  removal (26 or more) []  - 0 Hypo/Hyperglycemic Management (do not check if billed separately) []  - 0 Ankle / Brachial Index (ABI) - do not check if billed separately Has the patient been seen at the hospital within the last three years: Yes Total Score: 95 Level Of Care: New/Established - Level 3 Electronic  Signature(s) Signed: 06/03/2018 6:17:48 PM By: Elliot Gurney, BSN, RN, CWS, Kim RN, BSN Entered By: Elliot Gurney, BSN, RN, CWS, Kim on 06/01/2018 09:23:57 [name] ([id number]) -------------------------------------------------------------------------------- Lower Extremity Assessment Details Patient Name: KURTISS, COOVERT. Date of Service: 06/01/2018 8:00 AM Medical Record Number: [id number] Patient Account Number: [id number] Date of Birth/Sex: [birth year] (62 y.o. M) Treating RN: Renne Crigler Primary Care Lauran Romanski: Sindy Messing Other Clinician: Referring Ambriana Selway: Wolfgang Phoenix Treating Corina Stacy/Extender: Maxwell Caul Weeks in Treatment: 0 Electronic Signature(s) Signed: 06/01/2018 5:09:20 PM By: Renne Crigler Entered By: Renne Crigler on 06/01/2018 08:54:49 Suit, Lemond W. ([id number]) -------------------------------------------------------------------------------- Multi Wound Chart Details Patient Name: [name]. Date of Service: 06/01/2018 8:00 AM Medical Record Number: [id number] Patient Account Number: [id number] Date of Birth/Sex: [birth year] (62 y.o. M) Treating RN: Huel Coventry Primary Care Laprecious Austill: Sindy Messing Other Clinician: Referring Jeryl Umholtz: Wolfgang Phoenix Treating Shakeita Vandevander/Extender: Altamese [place] in Treatment: 0 Vital Signs Height(in): 73 Pulse(bpm): 82 Weight(lbs): 206 Blood Pressure(mmHg): 110/69 Body Mass Index(BMI): 27 Temperature(F): 97.8 Respiratory Rate 18 (breaths/min): Photos: [1:No Photos] [N/A:N/A] Wound Location: [1:Right Gluteus] [N/A:N/A] Wounding Event: [1:Gradually Appeared] [N/A:N/A] Primary Etiology:  [1:Pressure Ulcer] [N/A:N/A] Comorbid History: [1:Cataracts, Congestive Heart Failure, Coronary Artery Disease, Hypertension, Myocardial Infarction, History of pressure wounds, Rheumatoid Arthritis] [N/A:N/A] Date Acquired: [1:05/09/2018] [N/A:N/A] Weeks of Treatment: [1:0] [N/A:N/A] Wound Status: [1:Open] [N/A:N/A] Measurements L x W x D [1:19.3x8.1x4.4] [N/A:N/A] (cm) Area (cm) : [1:122.781] [N/A:N/A] Volume (cm) : [1:540.238] [N/A:N/A] Classification: [1:Category/Stage IV] [N/A:N/A] Exudate Amount: [1:Large] [N/A:N/A] Exudate Type: [1:Serosanguineous] [N/A:N/A] Exudate Color: [1:red, brown] [N/A:N/A] Wound Margin: [1:Distinct, outline attached] [N/A:N/A] Granulation Amount: [1:Large (67-100%)] [N/A:N/A] Granulation Quality: [1:Red, Pink] [N/A:N/A] Necrotic Amount: [1:Small (1-33%)] [N/A:N/A] Exposed Structures: [1:Fascia: Yes Fat Layer (Subcutaneous Tissue) Exposed: Yes Muscle: Yes Bone: Yes] [N/A:N/A] Epithelialization: [1:None] [N/A:N/A] Debridement: [1:Debridement - Excisional] [N/A:N/A] Pre-procedure [1:09:07] [N/A:N/A] Verification/Time Out Taken: Pain Control: [1:Other] [N/A:N/A] Tissue Debrided: [1:Muscle, Tendon, Subcutaneous, Slough] [N/A:N/A] Level: Skin/Subcutaneous N/A N/A Tissue/Muscle Debridement Area (sq cm): 15 N/A N/A Instrument: Blade, Forceps N/A N/A Bleeding: Moderate N/A N/A Hemostasis Achieved: Pressure N/A N/A Debridement Treatment Procedure was tolerated well N/A N/A Response: Post Debridement 19.3x8.1x4.5 N/A N/A Measurements L x W x D (cm) Post Debridement Volume: 552.516 N/A N/A (cm) Post Debridement Stage: Category/Stage IV N/A N/A Periwound Skin Texture: Excoriation: No N/A N/A Induration: No Callus: No Crepitus: No Rash: No Scarring: No Periwound Skin Moisture: Maceration: No N/A N/A Dry/Scaly: No Periwound Skin Color: Atrophie Blanche: No N/A N/A Cyanosis: No Ecchymosis: No Erythema: No Hemosiderin Staining: No Mottled:  No Pallor: No Rubor: No Temperature: No Abnormality N/A N/A Tenderness on Palpation: Yes N/A N/A Wound Preparation: Ulcer Cleansing: N/A N/A Rinsed/Irrigated with Saline Topical Anesthetic Applied: Other: lidocaine 4% Procedures Performed: Debridement N/A N/A Treatment Notes Electronic Signature(s) Signed: 06/01/2018 6:17:43 PM By: Baltazar Najjar MD Entered By: Baltazar Najjar on 06/01/2018 09:26:42 SHERRY, LUPI ([id number]) -------------------------------------------------------------------------------- Multi-Disciplinary Care Plan Details Patient Name: RODOLPH, PATILLO. Date of Service: 06/01/2018 8:00 AM Medical Record Number: [id number] Patient Account Number: [id number] Date of Birth/Sex: June 18, 1956 (62 y.o. M) Treating RN: Huel Coventry Primary Care Jaquelyn Sakamoto: Sindy Messing Other Clinician: Referring Keian Odriscoll: Wolfgang Phoenix Treating Bentzion Dauria/Extender: Altamese [place] in Treatment: 0 Active Inactive Electronic Signature(s) Signed: 06/03/2018 6:17:48 PM By: Elliot Gurney, BSN, RN, CWS, Kim RN, BSN Entered By: Elliot Gurney, BSN, RN, CWS, Kim on 06/01/2018 09:02:28 [name] ([id number]) -------------------------------------------------------------------------------- Pain Assessment Details Patient Name: SPENCE, GUSMANO. Date of Service: 06/01/2018 8:00 AM Medical Record Number: [id number] Patient Account Number: [id number]  Date of Birth/Sex: 1956/07/24 (62 y.o. M) Treating RN: Renne Crigler Primary Care Aleecia Tapia: Sindy Messing Other Clinician: Referring Lyriq Jarchow: Wolfgang Phoenix Treating Ninel Abdella/Extender: Maxwell Caul Weeks in Treatment: 0 Active Problems Location of Pain Severity and Description of Pain Patient Has Paino No Site Locations Pain Management and Medication Current Pain Management: Electronic Signature(s) Signed: 06/01/2018 5:09:20 PM By: Renne Crigler Entered By: Renne Crigler on 06/01/2018 08:27:32 Staup, Jw W.  ([id number]) -------------------------------------------------------------------------------- Wound Assessment Details Patient Name: [name]. Date of Service: 06/01/2018 8:00 AM Medical Record Number: [id number] Patient Account Number: [id number] Date of Birth/Sex: 1956-08-01 (62 y.o. M) Treating RN: Renne Crigler Primary Care Bruce Mayers: Sindy Messing Other Clinician: Referring Gabrille Kilbride: Wolfgang Phoenix Treating Abuk Selleck/Extender: Maxwell Caul Weeks in Treatment: 0 Wound Status Wound Number: 1 Primary Pressure Ulcer Etiology: Wound Location: Right Gluteus Wound Open Wounding Event: Gradually Appeared Status: Date Acquired: 05/09/2018 Comorbid Cataracts, Congestive Heart Failure, Coronary Weeks Of Treatment: 0 History: Artery Disease, Hypertension, Myocardial Clustered Wound: No Infarction, History of pressure wounds, Rheumatoid Arthritis Photos Photo Uploaded By: Renne Crigler on 06/01/2018 16:31:22 Wound Measurements Length: (cm) 19.3 Width: (cm) 8.1 Depth: (cm) 4.4 Area: (cm) 122.781 Volume: (cm) 540.238 % Reduction in Area: % Reduction in Volume: Epithelialization: None Tunneling: No Undermining: No Wound Description Classification: Category/Stage IV Wound Margin: Distinct, outline attached Exudate Amount: Large Exudate Type: Serosanguineous Exudate Color: red, brown Foul Odor After Cleansing: No Slough/Fibrino Yes Wound Bed Granulation Amount: Large (67-100%) Exposed Structure Granulation Quality: Red, Pink Fascia Exposed: Yes Necrotic Amount: Small (1-33%) Fat Layer (Subcutaneous Tissue) Exposed: Yes Necrotic Quality: Adherent Slough Muscle Exposed: Yes Necrosis of Muscle: No Bone Exposed: Yes Periwound Skin Texture Rupe, Demitris W. ([id number]) Texture Color No Abnormalities Noted: No No Abnormalities Noted: No Callus: No Atrophie Blanche: No Crepitus: No Cyanosis: No Excoriation: No Ecchymosis: No Induration: No Erythema:  No Rash: No Hemosiderin Staining: No Scarring: No Mottled: No Pallor: No Moisture Rubor: No No Abnormalities Noted: No Dry / Scaly: No Temperature / Pain Maceration: No Temperature: No Abnormality Tenderness on Palpation: Yes Wound Preparation Ulcer Cleansing: Rinsed/Irrigated with Saline Topical Anesthetic Applied: Other: lidocaine 4%, Electronic Signature(s) Signed: 06/01/2018 5:09:20 PM By: Renne Crigler Entered By: Renne Crigler on 06/01/2018 08:54:36 Geffre, Nell W. ([id number]) -------------------------------------------------------------------------------- Vitals Details Patient Name: [name]. Date of Service: 06/01/2018 8:00 AM Medical Record Number: [id number] Patient Account Number: [id number] Date of Birth/Sex: Feb 14, 1956 (62 y.o. M) Treating RN: Renne Crigler Primary Care Lucerito Rosinski: Sindy Messing Other Clinician: Referring Charolett Yarrow: Wolfgang Phoenix Treating Aundreya Souffrant/Extender: Altamese [place] in Treatment: 0 Vital Signs Time Taken: 08:27 Temperature (F): 97.8 Height (in): 73 Pulse (bpm): 82 Source: Stated Respiratory Rate (breaths/min): 18 Weight (lbs): 206 Blood Pressure (mmHg): 110/69 Source: Stated Reference Range: 80 - 120 mg / dl Body Mass Index (BMI): 27.2 Electronic Signature(s) Signed: 06/01/2018 5:09:20 PM By: Renne Crigler Entered By: Renne Crigler on 06/01/2018 08:28:09

## 2018-06-15 ENCOUNTER — Encounter: Payer: Medicaid Other | Attending: Nurse Practitioner | Admitting: Nurse Practitioner

## 2018-06-15 DIAGNOSIS — Z8673 Personal history of transient ischemic attack (TIA), and cerebral infarction without residual deficits: Secondary | ICD-10-CM | POA: Insufficient documentation

## 2018-06-15 DIAGNOSIS — Z7952 Long term (current) use of systemic steroids: Secondary | ICD-10-CM | POA: Diagnosis not present

## 2018-06-15 DIAGNOSIS — L89314 Pressure ulcer of right buttock, stage 4: Secondary | ICD-10-CM | POA: Diagnosis not present

## 2018-06-15 DIAGNOSIS — I110 Hypertensive heart disease with heart failure: Secondary | ICD-10-CM | POA: Insufficient documentation

## 2018-06-15 DIAGNOSIS — I255 Ischemic cardiomyopathy: Secondary | ICD-10-CM | POA: Insufficient documentation

## 2018-06-15 DIAGNOSIS — I252 Old myocardial infarction: Secondary | ICD-10-CM | POA: Insufficient documentation

## 2018-06-15 DIAGNOSIS — J849 Interstitial pulmonary disease, unspecified: Secondary | ICD-10-CM | POA: Insufficient documentation

## 2018-06-15 DIAGNOSIS — M8618 Other acute osteomyelitis, other site: Secondary | ICD-10-CM | POA: Diagnosis not present

## 2018-06-15 DIAGNOSIS — M069 Rheumatoid arthritis, unspecified: Secondary | ICD-10-CM | POA: Diagnosis not present

## 2018-06-15 DIAGNOSIS — L0231 Cutaneous abscess of buttock: Secondary | ICD-10-CM | POA: Insufficient documentation

## 2018-06-15 DIAGNOSIS — I714 Abdominal aortic aneurysm, without rupture: Secondary | ICD-10-CM | POA: Diagnosis not present

## 2018-06-15 DIAGNOSIS — M8588 Other specified disorders of bone density and structure, other site: Secondary | ICD-10-CM | POA: Insufficient documentation

## 2018-06-15 DIAGNOSIS — I509 Heart failure, unspecified: Secondary | ICD-10-CM | POA: Diagnosis not present

## 2018-06-15 DIAGNOSIS — I2510 Atherosclerotic heart disease of native coronary artery without angina pectoris: Secondary | ICD-10-CM | POA: Diagnosis not present

## 2018-06-22 ENCOUNTER — Encounter: Payer: Medicaid Other | Admitting: Internal Medicine

## 2018-06-22 DIAGNOSIS — L89314 Pressure ulcer of right buttock, stage 4: Secondary | ICD-10-CM | POA: Diagnosis not present

## 2018-06-28 ENCOUNTER — Encounter: Payer: Medicaid Other | Admitting: Student in an Organized Health Care Education/Training Program

## 2018-06-28 ENCOUNTER — Encounter

## 2018-06-28 ENCOUNTER — Ambulatory Visit: Payer: Medicaid Other | Admitting: Nurse Practitioner
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
## 2018-06-29 NOTE — Progress Notes (Signed)
[name], [name] ([id number]) Visit Report for 06/15/2018 Chief Complaint Document Details Patient Name: [name], [name] Date of Service: 06/15/2018 11:00 AM Medical Record Number: [id number] Patient Account Number: [id number] Date of Birth/Sex: Apr 29, 1956 (62 y.o. M) Treating RN: Huel Coventry Primary Care Provider: Sindy Messing Other Clinician: Referring Provider: Sindy Messing Treating Provider/Extender: Kathreen Cosier in Treatment: 2 Information Obtained from: Patient Chief Complaint right buttock stage 4 Electronic Signature(s) Signed: 06/15/2018 11:58:07 AM By: Bonnell Public Entered By: Bonnell Public on 06/15/2018 11:58:07 Seel, Sears W. ([id number]) -------------------------------------------------------------------------------- Debridement Details Patient Name: [name] Date of Service: 06/15/2018 11:00 AM Medical Record Number: [id number] Patient Account Number: [id number] Date of Birth/Sex: [birth year] (62 y.o. M) Treating RN: Huel Coventry Primary Care Provider: Sindy Messing Other Clinician: Referring Provider: Sindy Messing Treating Provider/Extender: Kathreen Cosier in Treatment: 2 Debridement Performed for Wound #1 Right Gluteus Assessment: Performed By: Physician Bonnell Public, NP Debridement Type: Debridement Pre-procedure Verification/Time Yes - 11:27 Out Taken: Start Time: 11:27 Pain Control: Other : lidocaine 4% Total Area Debrided (L x W): 15.2 (cm) x 7.3 (cm) = 110.96 (cm) Tissue and other material Viable, Non-Viable, Slough, Subcutaneous, Slough debrided: Level: Skin/Subcutaneous Tissue Debridement Description: Excisional Instrument: Curette Bleeding: Moderate Hemostasis Achieved: Pressure End Time: 11:28 Procedural Pain: 1 Post Procedural Pain: 1 Response to Treatment: Procedure was tolerated well Level of Consciousness: Awake and Alert Post Debridement Measurements of Total Wound Length: (cm) 15.2 Stage: Category/Stage  IV Width: (cm) 7.3 Depth: (cm) 3.5 Volume: (cm) 305.017 Character of Wound/Ulcer Post Requires Further Debridement Debridement: Post Procedure Diagnosis Same as Pre-procedure Electronic Signature(s) Signed: 06/15/2018 5:10:54 PM By: Bonnell Public Signed: 06/15/2018 5:18:50 PM By: Elliot Gurney, BSN, RN, CWS, Kim RN, BSN Entered By: Elliot Gurney, BSN, RN, CWS, Kim on 06/15/2018 11:29:07 [name], [name] ([id number]) -------------------------------------------------------------------------------- HPI Details Patient Name: [name] Date of Service: 06/15/2018 11:00 AM Medical Record Number: [id number] Patient Account Number: [id number] Date of Birth/Sex: 09-19-1956 (62 y.o. M) Treating RN: Huel Coventry Primary Care Provider: Sindy Messing Other Clinician: Referring Provider: Sindy Messing Treating Provider/Extender: Kathreen Cosier in Treatment: 2 History of Present Illness HPI Description: ADMISSION 06/01/18 This is a 62 year old man who is sent to [place] currently residing at [facility] skilled facility. He has a complicated recent medical history. His sister accompanies him tells [place] that he has been somewhat disabled since suffering a left basal ganglial CVA in 2018. At that point in time he also had myocardial infarctions. More recently he has been hospitalized from 04/04/18 through 04/12/18 with septic shock, coronary artery disease, congestive heart failure and an acute CVA. During this hospitalization he was noted to have sacral wounds but these were not felt to be the source of infection however the source of the infection was never really determined at that point. I believe he was sent to a skilled facility. He reports that at the hospital on 05/09/18 through 05/18/18. At that point he was noted to have a large abscess of the right buttock. He required a surgical IandD by Dr. Gerrit Friends of general surgery. Culture of this grew Citrobacter,Eikenella and Bacteroides. He had a CT scan of the  pelvis that showed an abnormal soft tissue collection just caudal to the right ischial tuberosity measuring 5.9 and by 4.7. This also involved the distal right deep muscles. There was some cortical bone loss on the right ischial tuberosity and there was some concern with osteomyelitis. He was felt to require treatment for osteomyelitis based on clinical and radiographic findings. He  did not have an MRI. Past medical history includes; abdominal aortic aneurysm, carotid stenosis, hypertension, osteoarthritis, interstitial lung disease on chronic steroids, rheumatoid arthritis, congestive heart failure/ischemic cardiomyopathy with an ejection fraction of 35-40% Also noteworthy is on 05/09/18 his albumin was 2.2 on admission to hospital. I don't think this is actually been repeated. The patient states his appetite is good and he is taking supplements 100%. 06/15/18-He is seen in follow up vibration for right buttock ulcer; this is stable in appearance with minimal amount of non-viable tissue. He is tolerating a negative pressure wound therapy and we will continue Electronic Signature(s) Signed: 06/15/2018 11:59:34 AM By: Bonnell Public Entered By: Bonnell Public on 06/15/2018 11:59:33 [name], [name] ([id number]) -------------------------------------------------------------------------------- Physician Orders Details Patient Name: [name] Date of Service: 06/15/2018 11:00 AM Medical Record Number: [id number] Patient Account Number: [id number] Date of Birth/Sex: [birth year] (62 y.o. M) Treating RN: Huel Coventry Primary Care Provider: Sindy Messing Other Clinician: Referring Provider: Sindy Messing Treating Provider/Extender: Kathreen Cosier in Treatment: 2 Verbal / Phone Orders: No Diagnosis Coding Wound Cleansing Wound #1 Right Gluteus o Clean wound with Normal Saline. o Cleanse wound with mild soap and water Anesthetic (add to Medication List) Wound #1 Right Gluteus o Topical  Lidocaine 4% cream applied to wound bed prior to debridement (In Clinic Only). o Benzocaine Topical Anesthetic Spray applied to wound bed prior to debridement (In Clinic Only). Skin Barriers/Peri-Wound Care Wound #1 Right Gluteus o Skin Prep Primary Wound Dressing Wound #1 Right Gluteus o Saline moistened gauze - until wound vac reapplied at facility Secondary Dressing Wound #1 Right Gluteus o ABD pad - 3rd layer o Dry Gauze - 2nd layer o Other - secure with Tegaderm or tape Dressing Change Frequency Wound #1 Right Gluteus o Change Dressing Monday, Wednesday, Friday - NPWT Follow-up Appointments Wound #1 Right Gluteus o Return Appointment in 1 week. Off-Loading Wound #1 Right Gluteus o Mattress - Air mattress o Turn and reposition every 2 hours Additional Orders / Instructions Wound #1 Right Gluteus o Increase protein intake. o Other: - Pro-stat for wound healing twice daily. [name], [name] ([id number]) Negative Pressure Wound Therapy Wound #1 Right Gluteus o Wound VAC settings at 125/130 mmHg continuous pressure. Use BLACK/GREEN foam to wound cavity. Use WHITE foam to fill any tunnel/s and/or undermining. Change VAC dressing 3 X WEEK. Change canister as indicated when full. Nurse may titrate settings and frequency of dressing changes as clinically indicated. [facility] to reapply; white foam over bone o Home Health Nurse may d/c VAC for s/s of increased infection, significant wound regression, or uncontrolled drainage. Notify [facility] at [phone]. Electronic Signature(s) Signed: 06/15/2018 5:10:54 PM By: Bonnell Public Signed: 06/15/2018 5:18:50 PM By: Elliot Gurney, BSN, RN, CWS, Kim RN, BSN Entered By: Elliot Gurney, BSN, RN, CWS, Kim on 06/15/2018 11:30:40 [name], [name] ([id number]) -------------------------------------------------------------------------------- Problem List Details Patient Name: [name], [name] Date of  Service: 06/15/2018 11:00 AM Medical Record Number: [id number] Patient Account Number: [id number] Date of Birth/Sex: [birth year] (61 y.o. M) Treating RN: Huel Coventry Primary Care Provider: Sindy Messing Other Clinician: Referring Provider: Sindy Messing Treating Provider/Extender: Kathreen Cosier in Treatment: 2 Active Problems ICD-10 Evaluated Encounter Code Description Active Date Today Diagnosis L89.314 Pressure ulcer of right buttock, stage 4 06/01/2018 No Yes M86.18 Other acute osteomyelitis, other site 06/01/2018 No Yes T81.31XD Disruption of external operation (surgical) wound, not 06/01/2018 No Yes elsewhere classified, subsequent encounter E43 Unspecified severe protein-calorie malnutrition 06/01/2018 No Yes Inactive  Problems Resolved Problems Electronic Signature(s) Signed: 06/15/2018 11:57:21 AM By: Bonnell Public Entered By: Bonnell Public on 06/15/2018 11:57:20 [name], [name] ([id number]) -------------------------------------------------------------------------------- Progress Note Details Patient Name: [name] Date of Service: 06/15/2018 11:00 AM Medical Record Number: [id number] Patient Account Number: [id number] Date of Birth/Sex: 1956-03-20 (63 y.o. M) Treating RN: Huel Coventry Primary Care Provider: Sindy Messing Other Clinician: Referring Provider: Sindy Messing Treating Provider/Extender: Kathreen Cosier in Treatment: 2 Subjective Chief Complaint Information obtained from Patient right buttock stage 4 History of Present Illness (HPI) ADMISSION 06/01/18 This is a 62 year old man who is sent to [place] currently residing at [facility] skilled facility. He has a complicated recent medical history. His sister accompanies him tells [place] that he has been somewhat disabled since suffering a left basal ganglial CVA in 2018. At that point in time he also had myocardial infarctions. More recently he has been hospitalized from 04/04/18 through 04/12/18 with septic shock,  coronary artery disease, congestive heart failure and an acute CVA. During this hospitalization he was noted to have sacral wounds but these were not felt to be the source of infection however the source of the infection was never really determined at that point. I believe he was sent to a skilled facility. He reports that at the hospital on 05/09/18 through 05/18/18. At that point he was noted to have a large abscess of the right buttock. He required a surgical IandD by Dr. Gerrit Friends of general surgery. Culture of this grew Citrobacter,Eikenella and Bacteroides. He had a CT scan of the pelvis that showed an abnormal soft tissue collection just caudal to the right ischial tuberosity measuring 5.9 and by 4.7. This also involved the distal right deep muscles. There was some cortical bone loss on the right ischial tuberosity and there was some concern with osteomyelitis. He was felt to require treatment for osteomyelitis based on clinical and radiographic findings. He did not have an MRI. Past medical history includes; abdominal aortic aneurysm, carotid stenosis, hypertension, osteoarthritis, interstitial lung disease on chronic steroids, rheumatoid arthritis, congestive heart failure/ischemic cardiomyopathy with an ejection fraction of 35-40% Also noteworthy is on 05/09/18 his albumin was 2.2 on admission to hospital. I don't think this is actually been repeated. The patient states his appetite is good and he is taking supplements 100%. 06/15/18-He is seen in follow up vibration for right buttock ulcer; this is stable in appearance with minimal amount of non-viable tissue. He is tolerating a negative pressure wound therapy and we will continue Objective Constitutional Vitals Time Taken: 11:20 AM, Height: 73 in, Weight: 206 lbs, BMI: 27.2, Temperature: 97.9 F, Pulse: 88 bpm, Respiratory Rate: 18 breaths/min, Blood Pressure: 102/62 mmHg. Integumentary (Hair, Skin) [name], [name] ([id number]) Wound #1  status is Open. Original cause of wound was Gradually Appeared. The wound is located on the Right Gluteus. The wound measures 15.2cm length x 7.3cm width x 3.5cm depth; 87.148cm^2 area and 305.017cm^3 volume. There is bone, muscle, Fat Layer (Subcutaneous Tissue) Exposed, and fascia exposed. There is no tunneling or undermining noted. There is a large amount of serosanguineous drainage noted. The wound margin is distinct with the outline attached to the wound base. There is large (67-100%) red, pink granulation within the wound bed. There is a small (1-33%) amount of necrotic tissue within the wound bed including Adherent Slough. The periwound skin appearance did not exhibit: Callus, Crepitus, Excoriation, Induration, Rash, Scarring, Dry/Scaly, Maceration, Atrophie Blanche, Cyanosis, Ecchymosis, Hemosiderin Staining, Mottled, Pallor, Rubor, Erythema. Periwound temperature was noted as  No Abnormality. The periwound has tenderness on palpation. Assessment Active Problems ICD-10 Pressure ulcer of right buttock, stage 4 Other acute osteomyelitis, other site Disruption of external operation (surgical) wound, not elsewhere classified, subsequent encounter Unspecified severe protein-calorie malnutrition Procedures Wound #1 Pre-procedure diagnosis of Wound #1 is a Pressure Ulcer located on the Right Gluteus . There was a Excisional Skin/Subcutaneous Tissue Debridement with a total area of 110.96 sq cm performed by Bonnell Public, NP. With the following instrument(s): Curette to remove Viable and Non-Viable tissue/material. Material removed includes Subcutaneous Tissue and Slough and after achieving pain control using Other (lidocaine 4%). No specimens were taken. A time out was conducted at 11:27, prior to the start of the procedure. A Moderate amount of bleeding was controlled with Pressure. The procedure was tolerated well with a pain level of 1 throughout and a pain level of 1 following the  procedure. Patient s Level of Consciousness post procedure was recorded as Awake and Alert. Post Debridement Measurements: 15.2cm length x 7.3cm width x 3.5cm depth; 305.017cm^3 volume. Post debridement Stage noted as Category/Stage IV. Character of Wound/Ulcer Post Debridement requires further debridement. Post procedure Diagnosis Wound #1: Same as Pre-Procedure Plan Wound Cleansing: Wound #1 Right Gluteus: Clean wound with Normal Saline. Cleanse wound with mild soap and water Anesthetic (add to Medication List): Wound #1 Right Gluteus: Topical Lidocaine 4% cream applied to wound bed prior to debridement (In Clinic Only). Benzocaine Topical Anesthetic Spray applied to wound bed prior to debridement (In Clinic Only). Skin Barriers/Peri-Wound Care: [name], [name] ([id number]) Wound #1 Right Gluteus: Skin Prep Primary Wound Dressing: Wound #1 Right Gluteus: Saline moistened gauze - until wound vac reapplied at facility Secondary Dressing: Wound #1 Right Gluteus: ABD pad - 3rd layer Dry Gauze - 2nd layer Other - secure with Tegaderm or tape Dressing Change Frequency: Wound #1 Right Gluteus: Change Dressing Monday, Wednesday, Friday - NPWT Follow-up Appointments: Wound #1 Right Gluteus: Return Appointment in 1 week. Off-Loading: Wound #1 Right Gluteus: Mattress - Air mattress Turn and reposition every 2 hours Additional Orders / Instructions: Wound #1 Right Gluteus: Increase protein intake. Other: - Pro-stat for wound healing twice daily. Negative Pressure Wound Therapy: Wound #1 Right Gluteus: Wound VAC settings at 125/130 mmHg continuous pressure. Use BLACK/GREEN foam to wound cavity. Use WHITE foam to fill any tunnel/s and/or undermining. Change VAC dressing 3 X WEEK. Change canister as indicated when full. Nurse may titrate settings and frequency of dressing changes as clinically indicated. [facility] to reapply; white foam over bone Home Health Nurse  may d/c VAC for s/s of increased infection, significant wound regression, or uncontrolled drainage. Notify [facility] at [phone]. Electronic Signature(s) Signed: 06/15/2018 11:59:45 AM By: Bonnell Public Entered By: Bonnell Public on 06/15/2018 11:59:45 [name], [name] ([id number]) -------------------------------------------------------------------------------- SuperBill Details Patient Name: [name] Date of Service: 06/15/2018 Medical Record Number: [id number] Patient Account Number: [id number] Date of Birth/Sex: 21-Apr-1956 (62 y.o. M) Treating RN: Huel Coventry Primary Care Provider: Sindy Messing Other Clinician: Referring Provider: Sindy Messing Treating Provider/Extender: Kathreen Cosier in Treatment: 2 Diagnosis Coding ICD-10 Codes Code Description L89.314 Pressure ulcer of right buttock, stage 4 M86.18 Other acute osteomyelitis, other site T81.31XD Disruption of external operation (surgical) wound, not elsewhere classified, subsequent encounter E43 Unspecified severe protein-calorie malnutrition Facility Procedures CPT4 Code: [id number] Description: 11042 - DEB SUBQ TISSUE 20 SQ CM/< ICD-10 Diagnosis Description L89.314 Pressure ulcer of right buttock, stage 4 Modifier: Quantity: 1 CPT4 Code: [id number] Description: 11045 -  DEB SUBQ TISS EA ADDL 20CM ICD-10 Diagnosis Description L89.314 Pressure ulcer of right buttock, stage 4 Modifier: Quantity: 5 Physician Procedures CPT4 Code: [id number] Description: 11042 - WC PHYS SUBQ TISS 20 SQ CM ICD-10 Diagnosis Description L89.314 Pressure ulcer of right buttock, stage 4 Modifier: Quantity: 1 CPT4 Code: [id number] Description: 11045 - WC PHYS SUBQ TISS EA ADDL 20 CM ICD-10 Diagnosis Description L89.314 Pressure ulcer of right buttock, stage 4 Modifier: Quantity: 5 Electronic Signature(s) Signed: 06/15/2018 11:59:56 AM By: Bonnell Public Entered By: Bonnell Public on 06/15/2018 11:59:56

## 2018-06-30 ENCOUNTER — Inpatient Hospital Stay: Payer: Medicaid Other | Admitting: Internal Medicine

## 2018-06-30 NOTE — Progress Notes (Signed)
[name], [name] ([id number]) Visit Report for 06/15/2018 Arrival Information Details Patient Name: [name], [name] Date of Service: 06/15/2018 11:00 AM Medical Record Number: [id number] Patient Account Number: [id number] Date of Birth/Sex: [birth year] (62 y.o. M) Treating RN: Renne Crigler Primary Care Hayden Kihara: Sindy Messing Other Clinician: Referring Tomi Paddock: Sindy Messing Treating Khori Underberg/Extender: Kathreen Cosier in Treatment: 2 Visit Information History Since Last Visit All ordered tests and consults were completed: No Patient Arrived: Stretcher Added or deleted any medications: No Arrival Time: 11:19 Any new allergies or adverse reactions: No Accompanied By: self Had a fall or experienced change in No Transfer Assistance: None activities of daily living that may affect Patient Identification Verified: Yes risk of falls: Secondary Verification Process Completed: Yes Signs or symptoms of abuse/neglect since last visito No Hospitalized since last visit: No Implantable device outside of the clinic excluding No cellular tissue based products placed in the center since last visit: Pain Present Now: Yes Electronic Signature(s) Signed: 06/15/2018 12:00:57 PM By: Renne Crigler Entered By: Renne Crigler on 06/15/2018 11:20:17 [name] ([id number]) -------------------------------------------------------------------------------- Encounter Discharge Information Details Patient Name: [name] Date of Service: 06/15/2018 11:00 AM Medical Record Number: [id number] Patient Account Number: [id number] Date of Birth/Sex: Dec 03, 1955 (62 y.o. M) Treating RN: Curtis Sites Primary Care Ysabel Stankovich: Sindy Messing Other Clinician: Referring Leasia Swann: Sindy Messing Treating Victoriya Pol/Extender: Kathreen Cosier in Treatment: 2 Encounter Discharge Information Items Discharge Condition: Stable Ambulatory Status: Stretcher Discharge Destination: Skilled Nursing  Facility Telephoned: No Orders Sent: Yes Transportation: Ambulance Accompanied ByHorald Chestnut Schedule Follow-up Appointment: Yes Clinical Summary of Care: Electronic Signature(s) Signed: 06/15/2018 12:49:02 PM By: Curtis Sites Entered By: Curtis Sites on 06/15/2018 12:49:02 [name], [name] ([id number]) -------------------------------------------------------------------------------- Lower Extremity Assessment Details Patient Name: [name] Date of Service: 06/15/2018 11:00 AM Medical Record Number: [id number] Patient Account Number: [id number] Date of Birth/Sex: [birth year] (62 y.o. M) Treating RN: Renne Crigler Primary Care Chikita Dogan: Sindy Messing Other Clinician: Referring Kenyette Gundy: Sindy Messing Treating Keaja Reaume/Extender: Bonnell Public Weeks in Treatment: 2 Electronic Signature(s) Signed: 06/15/2018 12:00:57 PM By: Renne Crigler Entered By: Renne Crigler on 06/15/2018 11:21:37 [name], [name] ([id number]) -------------------------------------------------------------------------------- Multi Wound Chart Details Patient Name: [name] Date of Service: 06/15/2018 11:00 AM Medical Record Number: [id number] Patient Account Number: [id number] Date of Birth/Sex: [birth year] (62 y.o. M) Treating RN: Huel Coventry Primary Care Mikylah Ackroyd: Sindy Messing Other Clinician: Referring Marymargaret Kirker: Sindy Messing Treating Vinny Taranto/Extender: Kathreen Cosier in Treatment: 2 Vital Signs Height(in): 73 Pulse(bpm): 88 Weight(lbs): 206 Blood Pressure(mmHg): 102/62 Body Mass Index(BMI): 27 Temperature(F): 97.9 Respiratory Rate 18 (breaths/min): Photos: [1:No Photos] [N/A:N/A] Wound Location: [1:Right Gluteus] [N/A:N/A] Wounding Event: [1:Gradually Appeared] [N/A:N/A] Primary Etiology: [1:Pressure Ulcer] [N/A:N/A] Comorbid History: [1:Cataracts, Congestive Heart Failure, Coronary Artery Disease, Hypertension, Myocardial Infarction, History of pressure wounds, Rheumatoid  Arthritis] [N/A:N/A] Date Acquired: [1:05/09/2018] [N/A:N/A] Weeks of Treatment: [1:2] [N/A:N/A] Wound Status: [1:Open] [N/A:N/A] Measurements L x W x D [1:15.2x7.3x3.5] [N/A:N/A] (cm) Area (cm) : [1:87.148] [N/A:N/A] Volume (cm) : [1:305.017] [N/A:N/A] % Reduction in Area: [1:29.00%] [N/A:N/A] % Reduction in Volume: [1:43.50%] [N/A:N/A] Classification: [1:Category/Stage IV] [N/A:N/A] Exudate Amount: [1:Large] [N/A:N/A] Exudate Type: [1:Serosanguineous] [N/A:N/A] Exudate Color: [1:red, brown] [N/A:N/A] Wound Margin: [1:Distinct, outline attached] [N/A:N/A] Granulation Amount: [1:Large (67-100%)] [N/A:N/A] Granulation Quality: [1:Red, Pink] [N/A:N/A] Necrotic Amount: [1:Small (1-33%)] [N/A:N/A] Exposed Structures: [1:Fascia: Yes Fat Layer (Subcutaneous Tissue) Exposed: Yes Muscle: Yes Bone: Yes] [N/A:N/A] Epithelialization: [1:None] [N/A:N/A] Debridement: [1:Debridement - Excisional] [N/A:N/A] Pre-procedure [1:11:27] [N/A:N/A] Verification/Time Out Taken: Pain Control: [1:Other] [N/A:N/A] Tissue Debrided: Subcutaneous, Slough N/A N/A  Level: Skin/Subcutaneous Tissue N/A N/A Debridement Area (sq cm): 110.96 N/A N/A Instrument: Curette N/A N/A Bleeding: Moderate N/A N/A Hemostasis Achieved: Pressure N/A N/A Procedural Pain: 1 N/A N/A Post Procedural Pain: 1 N/A N/A Debridement Treatment Procedure was tolerated well N/A N/A Response: Post Debridement 15.2x7.3x3.5 N/A N/A Measurements L x W x D (cm) Post Debridement Volume: 305.017 N/A N/A (cm) Post Debridement Stage: Category/Stage IV N/A N/A Periwound Skin Texture: Excoriation: No N/A N/A Induration: No Callus: No Crepitus: No Rash: No Scarring: No Periwound Skin Moisture: Maceration: No N/A N/A Dry/Scaly: No Periwound Skin Color: Atrophie Blanche: No N/A N/A Cyanosis: No Ecchymosis: No Erythema: No Hemosiderin Staining: No Mottled: No Pallor: No Rubor: No Temperature: No Abnormality N/A N/A Tenderness on  Palpation: Yes N/A N/A Wound Preparation: Ulcer Cleansing: N/A N/A Rinsed/Irrigated with Saline Topical Anesthetic Applied: Other: lidocaine 4% Procedures Performed: Debridement N/A N/A Treatment Notes Electronic Signature(s) Signed: 06/15/2018 11:57:29 AM By: Bonnell Public Entered By: Bonnell Public on 06/15/2018 11:57:29 [name], [name] ([id number]) -------------------------------------------------------------------------------- Multi-Disciplinary Care Plan Details Patient Name: [name], BORD. Date of Service: 06/15/2018 11:00 AM Medical Record Number: [id number] Patient Account Number: [id number] Date of Birth/Sex: 01-Mar-1956 (62 y.o. M) Treating RN: Huel Coventry Primary Care Donnie Gedeon: Sindy Messing Other Clinician: Referring Mayes Sangiovanni: Sindy Messing Treating Mystie Ormand/Extender: Kathreen Cosier in Treatment: 2 Active Inactive ` Necrotic Tissue Nursing Diagnoses: Knowledge deficit related to management of necrotic/devitalized tissue Goals: Necrotic/devitalized tissue will be minimized in the wound bed Date Initiated: 06/15/2018 Target Resolution Date: 06/29/2018 Goal Status: Active Interventions: Assess patient pain level pre-, during and post procedure and prior to discharge Treatment Activities: Apply topical anesthetic as ordered : 06/15/2018 Excisional debridement : 06/15/2018 Notes: ` Orientation to the Wound Care Program Nursing Diagnoses: Knowledge deficit related to the wound healing center program Goals: Patient/caregiver will verbalize understanding of the Wound Healing Center Program Date Initiated: 06/15/2018 Target Resolution Date: 07/01/2018 Goal Status: Active Interventions: Provide education on orientation to the wound center Notes: ` Pressure Nursing Diagnoses: Knowledge deficit related to management of pressures ulcers Goals: Patient will remain free from development of additional pressure ulcers [name], [name] ([id number]) Date Initiated:  06/15/2018 Target Resolution Date: 07/01/2018 Goal Status: Active Interventions: Assess: immobility, friction, shearing, incontinence upon admission and as needed Notes: ` Wound/Skin Impairment Nursing Diagnoses: Impaired tissue integrity Goals: Ulcer/skin breakdown will have a volume reduction of 80% by week 12 Date Initiated: 06/15/2018 Target Resolution Date: 09/15/2018 Goal Status: Active Interventions: Assess ulceration(s) every visit Treatment Activities: Skin care regimen initiated : 06/15/2018 Notes: Electronic Signature(s) Signed: 06/15/2018 5:18:50 PM By: Elliot Gurney, BSN, RN, CWS, Kim RN, BSN Entered By: Elliot Gurney, BSN, RN, CWS, Kim on 06/15/2018 11:27:16 Vana, [name] ([id number]) -------------------------------------------------------------------------------- Pain Assessment Details Patient Name: [name], [name] Date of Service: 06/15/2018 11:00 AM Medical Record Number: [id number] Patient Account Number: [id number] Date of Birth/Sex: 1956-08-23 (62 y.o. M) Treating RN: Renne Crigler Primary Care Danean Marner: Sindy Messing Other Clinician: Referring Kyla Duffy: Sindy Messing Treating Aimee Heldman/Extender: Kathreen Cosier in Treatment: 2 Active Problems Location of Pain Severity and Description of Pain Patient Has Paino Yes Site Locations Duration of the Pain. Constant / Intermittento Intermittent Rate the pain. Current Pain Level: 7 Character of Pain Describe the Pain: Aching Pain Management and Medication Current Pain Management: Electronic Signature(s) Signed: 06/15/2018 12:00:57 PM By: Renne Crigler Entered By: Renne Crigler on 06/15/2018 11:20:40 [name], [name] ([id number]) -------------------------------------------------------------------------------- Patient/Caregiver Education Details Patient Name: [name] Date of Service: 06/15/2018 11:00 AM Medical Record Number: [id number]  Patient Account Number: [id number] Date of Birth/Gender:  [birth year] (62 y.o. M) Treating RN: Curtis Sites Primary Care Physician: Sindy Messing Other Clinician: Referring Physician: Sindy Messing Treating Physician/Extender: Kathreen Cosier in Treatment: 2 Education Assessment Education Provided To: Patient Education Topics Provided Offloading: Handouts: Other: turning and repositioning every 2 hours at least Electronic Signature(s) Signed: 06/16/2018 4:33:06 PM By: Curtis Sites Entered By: Curtis Sites on 06/15/2018 12:49:30 [name], [name] ([id number]) -------------------------------------------------------------------------------- Wound Assessment Details Patient Name: [name] Date of Service: 06/15/2018 11:00 AM Medical Record Number: [id number] Patient Account Number: [id number] Date of Birth/Sex: 18-Sep-1956 (62 y.o. M) Treating RN: Renne Crigler Primary Care Laurali Goddard: Sindy Messing Other Clinician: Referring Nyisha Clippard: Sindy Messing Treating Penda Venturi/Extender: Kathreen Cosier in Treatment: 2 Wound Status Wound Number: 1 Primary Pressure Ulcer Etiology: Wound Location: Right Gluteus Wound Open Wounding Event: Gradually Appeared Status: Date Acquired: 05/09/2018 Comorbid Cataracts, Congestive Heart Failure, Coronary Weeks Of Treatment: 2 History: Artery Disease, Hypertension, Myocardial Clustered Wound: No Infarction, History of pressure wounds, Rheumatoid Arthritis Photos Photo Uploaded By: Renne Crigler on 06/15/2018 12:01:51 Wound Measurements Length: (cm) 15.2 Width: (cm) 7.3 Depth: (cm) 3.5 Area: (cm) 87.148 Volume: (cm) 305.017 % Reduction in Area: 29% % Reduction in Volume: 43.5% Epithelialization: None Tunneling: No Undermining: No Wound Description Classification: Category/Stage IV Wound Margin: Distinct, outline attached Exudate Amount: Large Exudate Type: Serosanguineous Exudate Color: red, brown Foul Odor After Cleansing: No Slough/Fibrino Yes Wound Bed Granulation  Amount: Large (67-100%) Exposed Structure Granulation Quality: Red, Pink Fascia Exposed: Yes Necrotic Amount: Small (1-33%) Fat Layer (Subcutaneous Tissue) Exposed: Yes Necrotic Quality: Adherent Slough Muscle Exposed: Yes Necrosis of Muscle: No Bone Exposed: Yes Periwound Skin Texture [name], Erle W. ([id number]) Texture Color No Abnormalities Noted: No No Abnormalities Noted: No Callus: No Atrophie Blanche: No Crepitus: No Cyanosis: No Excoriation: No Ecchymosis: No Induration: No Erythema: No Rash: No Hemosiderin Staining: No Scarring: No Mottled: No Pallor: No Moisture Rubor: No No Abnormalities Noted: No Dry / Scaly: No Temperature / Pain Maceration: No Temperature: No Abnormality Tenderness on Palpation: Yes Wound Preparation Ulcer Cleansing: Rinsed/Irrigated with Saline Topical Anesthetic Applied: Other: lidocaine 4%, Treatment Notes Wound #1 (Right Gluteus) 1. Cleansed with: Clean wound with Normal Saline 2. Anesthetic Topical Lidocaine 4% cream to wound bed prior to debridement 4. Dressing Applied: Saline moistened guaze 5. Secondary Dressing Applied ABD Pad Dry Gauze 7. Secured with Secretary/administrator) Signed: 06/15/2018 12:00:57 PM By: Renne Crigler Entered By: Renne Crigler on 06/15/2018 11:21:26 HAYGEN, ZEBROWSKI ([id number]) -------------------------------------------------------------------------------- Vitals Details Patient Name: [name] Date of Service: 06/15/2018 11:00 AM Medical Record Number: [id number] Patient Account Number: [id number] Date of Birth/Sex: 1956/04/07 (62 y.o. M) Treating RN: Renne Crigler Primary Care Rewa Weissberg: Sindy Messing Other Clinician: Referring Maycen Degregory: Sindy Messing Treating Bastien Strawser/Extender: Kathreen Cosier in Treatment: 2 Vital Signs Time Taken: 11:20 Temperature (F): 97.9 Height (in): 73 Pulse (bpm): 88 Weight (lbs): 206 Respiratory Rate (breaths/min): 18 Body Mass  Index (BMI): 27.2 Blood Pressure (mmHg): 102/62 Reference Range: 80 - 120 mg / dl Electronic Signature(s) Signed: 06/15/2018 12:00:57 PM By: Renne Crigler Entered By: Renne Crigler on 06/15/2018 11:20:59

## 2018-07-01 NOTE — Progress Notes (Signed)
ANKIT, KOPECKY ([id number]) Visit Report for 06/22/2018 Arrival Information Details Patient Name: [name], [name] Date of Service: 06/22/2018 9:15 AM Medical Record Number: [id number] Patient Account Number: [id number] Date of Birth/Sex: Feb 16, 1956 (62 y.o. Male) Treating RN: Phillis Haggis Primary Care Dejha King: Sindy Messing Other Clinician: Referring Clarence Dunsmore: Sindy Messing Treating Tor Tsuda/Extender: [place] in Treatment: 3 Visit Information History Since Last Visit All ordered tests and consults were completed: No Patient Arrived: Stretcher Added or deleted any medications: No Arrival Time: 09:35 Any new allergies or adverse reactions: No Accompanied By: self Had a fall or experienced change in No Transfer Assistance: Stretcher activities of daily living that may affect Patient Identification Verified: Yes risk of falls: Secondary Verification Process Completed: Yes Signs or symptoms of abuse/neglect since last visito No Patient Requires Transmission-Based No Hospitalized since last visit: No Precautions: Implantable device outside of the clinic excluding No Patient Has Alerts: No cellular tissue based products placed in the center since last visit: Has Dressing in Place as Prescribed: Yes Pain Present Now: No Electronic Signature(s) Signed: 06/22/2018 4:43:18 PM By: Alejandro Mulling Entered By: Alejandro Mulling on 06/22/2018 09:39:43 [name], [name] ([id number]) -------------------------------------------------------------------------------- Lower Extremity Assessment Details Patient Name: [name] Date of Service: 06/22/2018 9:15 AM Medical Record Number: [id number] Patient Account Number: [id number] Date of Birth/Sex: [birth year] (62 y.o. Male) Treating RN: Phillis Haggis Primary Care Micky Overturf: Sindy Messing Other Clinician: Referring Derry Arbogast: Sindy Messing Treating Shanya Ferriss/Extender: Maxwell Caul Weeks in Treatment: 3 Electronic  Signature(s) Signed: 06/22/2018 4:43:18 PM By: Alejandro Mulling Entered By: Alejandro Mulling on 06/22/2018 09:51:52 [name], [name] ([id number]) -------------------------------------------------------------------------------- Multi Wound Chart Details Patient Name: [name] Date of Service: 06/22/2018 9:15 AM Medical Record Number: [id number] Patient Account Number: [id number] Date of Birth/Sex: 08-02-1956 (62 y.o. Male) Treating RN: Curtis Sites Primary Care Seabron Iannello: Sindy Messing Other Clinician: Referring Kaily Wragg: Sindy Messing Treating Leeanne Butters/Extender: Maxwell Caul Weeks in Treatment: 3 Vital Signs Height(in): 73 Pulse(bpm): 89 Weight(lbs): 206 Blood Pressure(mmHg): 114/75 Body Mass Index(BMI): 27 Temperature(F): 99.3 Respiratory Rate 16 (breaths/min): Photos: [1:No Photos] [N/A:N/A] Wound Location: [1:Right Gluteus] [N/A:N/A] Wounding Event: [1:Gradually Appeared] [N/A:N/A] Primary Etiology: [1:Pressure Ulcer] [N/A:N/A] Comorbid History: [1:Cataracts, Congestive Heart Failure, Coronary Artery Disease, Hypertension, Myocardial Infarction, History of pressure wounds, Rheumatoid Arthritis] [N/A:N/A] Date Acquired: [1:05/09/2018] [N/A:N/A] Weeks of Treatment: [1:3] [N/A:N/A] Wound Status: [1:Open] [N/A:N/A] Measurements L x W x D [1:13.6x9.4x3.2] [N/A:N/A] (cm) Area (cm) : [1:100.405] [N/A:N/A] Volume (cm) : [1:321.297] [N/A:N/A] % Reduction in Area: [1:18.20%] [N/A:N/A] % Reduction in Volume: [1:40.50%] [N/A:N/A] Classification: [1:Category/Stage IV] [N/A:N/A] Exudate Amount: [1:Large] [N/A:N/A] Exudate Type: [1:Purulent] [N/A:N/A] Exudate Color: [1:yellow, brown, green] [N/A:N/A] Wound Margin: [1:Distinct, outline attached] [N/A:N/A] Granulation Amount: [1:Large (67-100%)] [N/A:N/A] Granulation Quality: [1:Red, Pink] [N/A:N/A] Necrotic Amount: [1:Small (1-33%)] [N/A:N/A] Exposed Structures: [1:Fascia: Yes Fat Layer (Subcutaneous Tissue) Exposed:  Yes Muscle: Yes Bone: Yes] [N/A:N/A] Epithelialization: [1:None] [N/A:N/A] Debridement: [1:Debridement - Excisional] [N/A:N/A] Pre-procedure [1:09:56] [N/A:N/A] Verification/Time Out Taken: Pain Control: [1:Lidocaine 4% Topical Solution] [N/A:N/A] Tissue Debrided: Subcutaneous, Slough N/A N/A Level: Skin/Subcutaneous Tissue N/A N/A Debridement Area (sq cm): 3 N/A N/A Instrument: Curette N/A N/A Bleeding: Minimum N/A N/A Hemostasis Achieved: Pressure N/A N/A Procedural Pain: 0 N/A N/A Post Procedural Pain: 0 N/A N/A Debridement Treatment Procedure was tolerated well N/A N/A Response: Post Debridement 13.6x9.4x3.2 N/A N/A Measurements L x W x D (cm) Post Debridement Volume: 321.297 N/A N/A (cm) Post Debridement Stage: Category/Stage IV N/A N/A Periwound Skin Texture: Scarring: Yes N/A N/A Excoriation: No Induration: No Callus: No  Crepitus: No Rash: No Periwound Skin Moisture: Maceration: No N/A N/A Dry/Scaly: No Periwound Skin Color: Atrophie Blanche: No N/A N/A Cyanosis: No Ecchymosis: No Erythema: No Hemosiderin Staining: No Mottled: No Pallor: No Rubor: No Temperature: No Abnormality N/A N/A Tenderness on Palpation: Yes N/A N/A Wound Preparation: Ulcer Cleansing: N/A N/A Rinsed/Irrigated with Saline Topical Anesthetic Applied: Other: lidocaine 4% Procedures Performed: Debridement N/A N/A Treatment Notes Electronic Signature(s) Signed: 06/22/2018 5:35:06 PM By: Baltazar Najjar MD Entered By: Baltazar Najjar on 06/22/2018 10:09:43 [name], [name] ([id number]) -------------------------------------------------------------------------------- Multi-Disciplinary Care Plan Details Patient Name: [name], [name] Date of Service: 06/22/2018 9:15 AM Medical Record Number: [id number] Patient Account Number: [id number] Date of Birth/Sex: May 27, 1956 (62 y.o. Male) Treating RN: Curtis Sites Primary Care Dsean Vantol: Sindy Messing Other Clinician: Referring Hawthorne Day:  Sindy Messing Treating Cannen Dupras/Extender: Maxwell Caul Weeks in Treatment: 3 Active Inactive ` Necrotic Tissue Nursing Diagnoses: Knowledge deficit related to management of necrotic/devitalized tissue Goals: Necrotic/devitalized tissue will be minimized in the wound bed Date Initiated: 06/15/2018 Target Resolution Date: 06/29/2018 Goal Status: Active Interventions: Assess patient pain level pre-, during and post procedure and prior to discharge Treatment Activities: Apply topical anesthetic as ordered : 06/15/2018 Excisional debridement : 06/15/2018 Notes: ` Orientation to the Wound Care Program Nursing Diagnoses: Knowledge deficit related to the wound healing center program Goals: Patient/caregiver will verbalize understanding of the Wound Healing Center Program Date Initiated: 06/15/2018 Target Resolution Date: 07/01/2018 Goal Status: Active Interventions: Provide education on orientation to the wound center Notes: ` Pressure Nursing Diagnoses: Knowledge deficit related to management of pressures ulcers Goals: Patient will remain free from development of additional pressure ulcers [name], [name] ([id number]) Date Initiated: 06/15/2018 Target Resolution Date: 07/01/2018 Goal Status: Active Interventions: Assess: immobility, friction, shearing, incontinence upon admission and as needed Notes: ` Wound/Skin Impairment Nursing Diagnoses: Impaired tissue integrity Goals: Ulcer/skin breakdown will have a volume reduction of 80% by week 12 Date Initiated: 06/15/2018 Target Resolution Date: 09/15/2018 Goal Status: Active Interventions: Assess ulceration(s) every visit Treatment Activities: Skin care regimen initiated : 06/15/2018 Notes: Electronic Signature(s) Signed: 06/22/2018 5:18:29 PM By: Curtis Sites Entered By: Curtis Sites on 06/22/2018 09:55:25 [name], [name]  ([id number]) -------------------------------------------------------------------------------- Pain Assessment Details Patient Name: [name] Date of Service: 06/22/2018 9:15 AM Medical Record Number: [id number] Patient Account Number: [id number] Date of Birth/Sex: [birth year] (62 y.o. Male) Treating RN: Phillis Haggis Primary Care Rayner Erman: Sindy Messing Other Clinician: Referring Coltin Casher: Sindy Messing Treating Traniya Prichett/Extender: Maxwell Caul Weeks in Treatment: 3 Active Problems Location of Pain Severity and Description of Pain Patient Has Paino No Site Locations Pain Management and Medication Current Pain Management: Electronic Signature(s) Signed: 06/22/2018 4:43:18 PM By: Alejandro Mulling Entered By: Alejandro Mulling on 06/22/2018 09:39:52 [name], [name] ([id number]) -------------------------------------------------------------------------------- Wound Assessment Details Patient Name: [name] Date of Service: 06/22/2018 9:15 AM Medical Record Number: [id number] Patient Account Number: [id number] Date of Birth/Sex: February 21, 1956 (62 y.o. Male) Treating RN: Phillis Haggis Primary Care Prince Olivier: Sindy Messing Other Clinician: Referring Zebediah Beezley: Sindy Messing Treating Eniya Cannady/Extender: Maxwell Caul Weeks in Treatment: 3 Wound Status Wound Number: 1 Primary Pressure Ulcer Etiology: Wound Location: Right Gluteus Wound Open Wounding Event: Gradually Appeared Status: Date Acquired: 05/09/2018 Comorbid Cataracts, Congestive Heart Failure, Coronary Weeks Of Treatment: 3 History: Artery Disease, Hypertension, Myocardial Clustered Wound: No Infarction, History of pressure wounds, Rheumatoid Arthritis Photos Photo Uploaded By: Alejandro Mulling on 06/22/2018 16:30:37 Wound Measurements Length: (cm) 13.6 Width: (cm) 9.4 Depth: (cm) 3.2 Area: (cm) 100.405 Volume: (cm) 321.297 % Reduction  in Area: 18.2% % Reduction in Volume:  40.5% Epithelialization: None Tunneling: No Undermining: No Wound Description Classification: Category/Stage IV Wound Margin: Distinct, outline attached Exudate Amount: Large Exudate Type: Purulent Exudate Color: yellow, brown, green Foul Odor After Cleansing: No Slough/Fibrino Yes Wound Bed Granulation Amount: Large (67-100%) Exposed Structure Granulation Quality: Red, Pink Fascia Exposed: Yes Necrotic Amount: Small (1-33%) Fat Layer (Subcutaneous Tissue) Exposed: Yes Necrotic Quality: Adherent Slough Muscle Exposed: Yes Necrosis of Muscle: No Bone Exposed: Yes Periwound Skin Texture [name], [name] ([id number]) Texture Color No Abnormalities Noted: No No Abnormalities Noted: No Callus: No Atrophie Blanche: No Crepitus: No Cyanosis: No Excoriation: No Ecchymosis: No Induration: No Erythema: No Rash: No Hemosiderin Staining: No Scarring: Yes Mottled: No Pallor: No Moisture Rubor: No No Abnormalities Noted: No Dry / Scaly: No Temperature / Pain Maceration: No Temperature: No Abnormality Tenderness on Palpation: Yes Wound Preparation Ulcer Cleansing: Rinsed/Irrigated with Saline Topical Anesthetic Applied: Other: lidocaine 4%, Electronic Signature(s) Signed: 06/22/2018 4:43:18 PM By: Alejandro Mulling Entered By: Alejandro Mulling on 06/22/2018 09:49:36 [name], [name] ([id number]) -------------------------------------------------------------------------------- Vitals Details Patient Name: [name] Date of Service: 06/22/2018 9:15 AM Medical Record Number: [id number] Patient Account Number: [id number] Date of Birth/Sex: 06-18-1956 (62 y.o. Male) Treating RN: Phillis Haggis Primary Care Aydenn Gervin: Sindy Messing Other Clinician: Referring Brendolyn Stockley: Sindy Messing Treating Joao Mccurdy/Extender: Maxwell Caul Weeks in Treatment: 3 Vital Signs Time Taken: 09:39 Temperature (F): 99.3 Height (in): 73 Pulse (bpm): 89 Weight (lbs): 206 Respiratory  Rate (breaths/min): 16 Body Mass Index (BMI): 27.2 Blood Pressure (mmHg): 114/75 Reference Range: 80 - 120 mg / dl Electronic Signature(s) Signed: 06/22/2018 4:43:18 PM By: Alejandro Mulling Entered By: Alejandro Mulling on 06/22/2018 09:40:22

## 2018-07-02 NOTE — Progress Notes (Signed)
[name], [name] ([id number]) Visit Report for 06/22/2018 Debridement Details Patient Name: [name], [name] Date of Service: 06/22/2018 9:15 AM Medical Record Number: [id number] Patient Account Number: [id number] Date of Birth/Sex: 12/19/1955 (62 y.o. Male) Treating RN: [name] Primary Care Provider: Sindy [name] Other Clinician: Referring Provider: Sindy [name] Treating Provider/Extender: [name] in Treatment: 3 Debridement Performed for Wound #1 Right Gluteus Assessment: Performed By: Physician Ryan Caul, MD Debridement Type: Debridement Pre-procedure Verification/Time Yes - 09:56 Out Taken: Start Time: 09:56 Pain Control: Lidocaine 4% Topical Solution Total Area Debrided (L x W): 3 (cm) x 1 (cm) = 3 (cm) Tissue and other material Viable, Non-Viable, Slough, Subcutaneous, Slough debrided: Level: Skin/Subcutaneous Tissue Debridement Description: Excisional Instrument: Curette Bleeding: Minimum Hemostasis Achieved: Pressure End Time: 09:59 Procedural Pain: 0 Post Procedural Pain: 0 Response to Treatment: Procedure was tolerated well Level of Consciousness: Awake and Alert Post Debridement Measurements of Total Wound Length: (cm) 13.6 Stage: Category/Stage IV Width: (cm) 9.4 Depth: (cm) 3.2 Volume: (cm) 321.297 Character of Wound/Ulcer Post Improved Debridement: Post Procedure Diagnosis Same as Pre-procedure Electronic Signature(s) Signed: 06/22/2018 5:35:06 PM By: Ryan Najjar MD Signed: 06/23/2018 4:35:08 PM By: [name], BSN, RN, CWS, [name] Entered By: [name] on 06/22/2018 10:09:53 [name], [name] ([id number]) -------------------------------------------------------------------------------- HPI Details Patient Name: [name], [name] Date of Service: 06/22/2018 9:15 AM Medical Record Number: [id number] Patient Account Number: [id number] Date of Birth/Sex: Mar 14, 1956 (62 y.o. Male) Treating RN: [name] Primary  Care Provider: Sindy [name] Other Clinician: Referring Provider: Sindy [name] Treating Provider/Extender: [name] in Treatment: 3 History of Present Illness HPI Description: ADMISSION 06/01/18 This is a 62 year old man who is sent to [place] currently residing at [facility] skilled facility. He has a complicated recent medical history. His sister accompanies him tells [place] that he has been somewhat disabled since suffering a left basal ganglial CVA in 2018. At that point in time he also had myocardial infarctions. More recently he has been hospitalized from 04/04/18 through 04/12/18 with septic shock, coronary artery disease, congestive heart failure and an acute CVA. During this hospitalization he was noted to have sacral wounds but these were not felt to be the source of infection however the source of the infection was never really determined at that point. I believe he was sent to a skilled facility. He reports that at the hospital on 05/09/18 through 05/18/18. At that point he was noted to have a large abscess of the right buttock. He required a surgical IandD by Dr. Gerrit [name] of general surgery. Culture of this grew Citrobacter,Eikenella and Bacteroides. He had a CT scan of the pelvis that showed an abnormal soft tissue collection just caudal to the right ischial tuberosity measuring 5.9 and by 4.7. This also involved the distal right deep muscles. There was some cortical bone loss on the right ischial tuberosity and there was some concern with osteomyelitis. He was felt to require treatment for osteomyelitis based on clinical and radiographic findings. He did not have an MRI. Past medical history includes; abdominal aortic aneurysm, carotid stenosis, hypertension, osteoarthritis, interstitial lung disease on chronic steroids, rheumatoid arthritis, congestive heart failure/ischemic cardiomyopathy with an ejection fraction of 35-40% Also noteworthy is on 05/09/18 his albumin was 2.2 on  admission to hospital. I don't think this is actually been repeated. The patient states his appetite is good and he is taking supplements 100%. 06/15/18-He is seen in follow up vibration for right buttock ulcer; this is stable in appearance with  minimal amount of non-viable tissue. 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 We've been using a wound VAC. This is centered over the right ischial tuberosity. He is making generally good progress. He was severely hypoalbuminemic at one time although he states that he eats well and takes his supplements. Electronic Signature(s) Signed: 06/22/2018 5:35:06 PM By: Ryan Najjar MD Entered By: [name] on 06/22/2018 10:11:56 [name], [name] ([id number]) -------------------------------------------------------------------------------- Physical Exam Details Patient Name: [name], [name] Date of Service: 06/22/2018 9:15 AM Medical Record Number: [id number] Patient Account Number: [id number] Date of Birth/Sex: July 13, 1956 (62 y.o. Male) Treating RN: [name] Primary Care Provider: Sindy [name] Other Clinician: Referring Provider: Sindy [name] Treating Provider/Extender: [name] in Treatment: 3 Constitutional Sitting or standing Blood Pressure is within target range for patient.. Pulse regular and within target range for patient.[name] Respirations regular, non-labored and within target range.. Temperature is normal and within the target range for the patient.[name] somewhat frail-looking man but alert and responsive. Respiratory Respiratory effort is easy and symmetric bilaterally. Rate is normal at rest and on room air.[name] Psychiatric No evidence of depression, anxiety, or agitation. Calm, cooperative, and communicative. Appropriate interactions and affect.. Notes wound exam; large wound over the right  buttock centered around the ischial tuberosity. There is no exposed bone today. Most of the tissue looks healthy. Superior to the ischial tuberosity there is an overhanging area. This is covered with necrotic subcutaneous tissue. I debrided this with a #5 curet to get down to a completely healthy wound surface. Only a small area debrided roughly 3 x 1 cm. There is no evidence of surrounding infection Electronic Signature(s) Signed: 06/22/2018 5:35:06 PM By: Ryan Najjar MD Entered By: [name] on 06/22/2018 10:13:53 Whichard, [name] ([id number]) -------------------------------------------------------------------------------- Physician Orders Details Patient Name: [name], [name] Date of Service: 06/22/2018 9:15 AM Medical Record Number: [id number] Patient Account Number: [id number] Date of Birth/Sex: 09-Mar-1956 (62 y.o. Male) Treating RN: Curtis Sites Primary Care Provider: Sindy [name] Other Clinician: Referring Provider: Sindy [name] Treating Provider/Extender: Altamese Shackelford in Treatment: 3 Verbal / Phone Orders: No Diagnosis Coding Wound Cleansing Wound #1 Right Gluteus o Clean wound with Normal Saline. o Cleanse wound with mild soap and water Anesthetic (add to Medication List) Wound #1 Right Gluteus o Topical Lidocaine 4% cream applied to wound bed prior to debridement (In Clinic Only). o Benzocaine Topical Anesthetic Spray applied to wound bed prior to debridement (In Clinic Only). Skin Barriers/Peri-Wound Care Wound #1 Right Gluteus o Skin Prep Primary Wound Dressing Wound #1 Right Gluteus o Saline moistened gauze - until wound vac reapplied at facility o Silver Collagen - silver collagen to undermining area above ischial tuberosity under NPWT foam Secondary Dressing Wound #1 Right Gluteus o ABD pad - 3rd layer in clinic only o Dry Gauze - 2nd layer in clinic only o Other - secure with Tegaderm or tape in clinic only Dressing  Change Frequency Wound #1 Right Gluteus o Change Dressing Monday, Wednesday, Friday - NPWT Follow-up Appointments Wound #1 Right Gluteus o Return Appointment in 2 [name]. Off-Loading Wound #1 Right Gluteus o Mattress - Air mattress o Turn and reposition every 2 hours Additional Orders / Instructions Wound #1 Right Gluteus o Increase protein intake. [name], [name] ([id number]) o Other: - Pro-stat for wound healing twice daily. Negative Pressure Wound Therapy Wound #1 Right Gluteus o Wound  VAC settings at 125/130 mmHg continuous pressure. Use BLACK/GREEN foam to wound cavity. Use WHITE foam to fill any tunnel/s and/or undermining. Change VAC dressing 3 X WEEK. Change canister as indicated when full. Nurse may titrate settings and frequency of dressing changes as clinically indicated. [facility] to reapply; white foam over bone o Home Health Nurse may d/c VAC for s/s of increased infection, significant wound regression, or uncontrolled drainage. [facility] at [phone]. Electronic Signature(s) Signed: 06/22/2018 5:18:29 PM By: Curtis Sites Signed: 06/22/2018 5:35:06 PM By: Ryan Najjar MD Entered By: Curtis Sites on 06/22/2018 09:59:50 Byrnes, [name] ([id number]) -------------------------------------------------------------------------------- Problem List Details Patient Name: [name], [name] Date of Service: 06/22/2018 9:15 AM Medical Record Number: [id number] Patient Account Number: [id number] Date of Birth/Sex: 12/14/1955 (62 y.o. Male) Treating RN: [name] Primary Care Provider: Sindy [name] Other Clinician: Referring Provider: Sindy [name] Treating Provider/Extender: [name] in Treatment: 3 Active Problems ICD-10 Evaluated Encounter Code Description Active Date Today Diagnosis L89.314 Pressure ulcer of right buttock, stage 4 06/01/2018 No Yes M86.18 Other acute osteomyelitis, other site  06/01/2018 No Yes T81.31XD Disruption of external operation (surgical) wound, not 06/01/2018 No Yes elsewhere classified, subsequent encounter E43 Unspecified severe protein-calorie malnutrition 06/01/2018 No Yes Inactive Problems Resolved Problems Electronic Signature(s) Signed: 06/22/2018 5:35:06 PM By: Ryan Najjar MD Entered By: [name] on 06/22/2018 10:09:29 [name] ([id number]) -------------------------------------------------------------------------------- Progress Note Details Patient Name: [name] Date of Service: 06/22/2018 9:15 AM Medical Record Number: [id number] Patient Account Number: [id number] Date of Birth/Sex: 08-17-1956 (62 y.o. Male) Treating RN: [name] Primary Care Provider: Sindy [name] Other Clinician: Referring Provider: Sindy [name] Treating Provider/Extender: [name] in Treatment: 3 Subjective History of Present Illness (HPI) ADMISSION 06/01/18 This is a 62 year old man who is sent to [place] currently residing at [facility] skilled facility. He has a complicated recent medical history. His sister accompanies him tells [place] that he has been somewhat disabled since suffering a left basal ganglial CVA in 2018. At that point in time he also had myocardial infarctions. More recently he has been hospitalized from 04/04/18 through 04/12/18 with septic shock, coronary artery disease, congestive heart failure and an acute CVA. During this hospitalization he was noted to have sacral wounds but these were not felt to be the source of infection however the source of the infection was never really determined at that point. I believe he was sent to a skilled facility. He reports that at the hospital on 05/09/18 through 05/18/18. At that point he was noted to have a large abscess of the right buttock. He required a surgical IandD by Dr. Gerrit [name] of general surgery. Culture of this grew Citrobacter,Eikenella and Bacteroides. He had a CT scan of  the pelvis that showed an abnormal soft tissue collection just caudal to the right ischial tuberosity measuring 5.9 and by 4.7. This also involved the distal right deep muscles. There was some cortical bone loss on the right ischial tuberosity and there was some concern with osteomyelitis. He was felt to require treatment for osteomyelitis based on clinical and radiographic findings. He did not have an MRI. Past medical history includes; abdominal aortic aneurysm, carotid stenosis, hypertension, osteoarthritis, interstitial lung disease on chronic steroids, rheumatoid arthritis, congestive heart failure/ischemic cardiomyopathy with an ejection fraction of 35-40% Also noteworthy is on 05/09/18 his albumin was 2.2 on admission to hospital. I don't think this is actually been repeated. The patient states his appetite is good  and he is taking supplements 100%. 06/15/18-He is seen in follow up vibration for right buttock ulcer; this is stable in appearance with minimal amount of non-viable tissue. 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 We've been using a wound VAC. This is centered over the right ischial tuberosity. He is making generally good progress. He was severely hypoalbuminemic at one time although he states that he eats well and takes his supplements. Objective Constitutional Sitting or standing Blood Pressure is within target range for patient.. Pulse regular and within target range for patient.[name] Respirations regular, non-labored and within target range.. Temperature is normal and within the target range for the patient.[name] somewhat frail-looking man but alert and responsive. Vitals Time Taken: 9:39 AM, Height: 73 in, Weight: 206 lbs, BMI: 27.2, Temperature: 99.3 F, Pulse: 89 bpm, Respiratory Rate: 16 breaths/min, Blood Pressure: 114/75 mmHg. [name], [name]  ([id number]) Respiratory Respiratory effort is easy and symmetric bilaterally. Rate is normal at rest and on room air.[name] Psychiatric No evidence of depression, anxiety, or agitation. Calm, cooperative, and communicative. Appropriate interactions and affect.. General Notes: wound exam; large wound over the right buttock centered around the ischial tuberosity. There is no exposed bone today. Most of the tissue looks healthy. Superior to the ischial tuberosity there is an overhanging area. This is covered with necrotic subcutaneous tissue. I debrided this with a #5 curet to get down to a completely healthy wound surface. Only a small area debrided roughly 3 x 1 cm. There is no evidence of surrounding infection Integumentary (Hair, Skin) Wound #1 status is Open. Original cause of wound was Gradually Appeared. The wound is located on the Right Gluteus. The wound measures 13.6cm length x 9.4cm width x 3.2cm depth; 100.405cm^2 area and 321.297cm^3 volume. There is bone, muscle, Fat Layer (Subcutaneous Tissue) Exposed, and fascia exposed. There is no tunneling or undermining noted. There is a large amount of purulent drainage noted. The wound margin is distinct with the outline attached to the wound base. There is large (67-100%) red, pink granulation within the wound bed. There is a small (1-33%) amount of necrotic tissue within the wound bed including Adherent Slough. The periwound skin appearance exhibited: Scarring. The periwound skin appearance did not exhibit: Callus, Crepitus, Excoriation, Induration, Rash, Dry/Scaly, Maceration, Atrophie Blanche, Cyanosis, Ecchymosis, Hemosiderin Staining, Mottled, Pallor, Rubor, Erythema. Periwound temperature was noted as No Abnormality. The periwound has tenderness on palpation. Assessment Active Problems ICD-10 Pressure ulcer of right buttock, stage 4 Other acute osteomyelitis, other site Disruption of external operation (surgical) wound, not elsewhere  classified, subsequent encounter Unspecified severe protein-calorie malnutrition Procedures Wound #1 Pre-procedure diagnosis of Wound #1 is a Pressure Ulcer located on the Right Gluteus . There was a Excisional Skin/Subcutaneous Tissue Debridement with a total area of 3 sq cm performed by Ryan Caul, MD. With the following instrument(s): Curette to remove Viable and Non-Viable tissue/material. Material removed includes Subcutaneous Tissue and Slough and after achieving pain control using Lidocaine 4% Topical Solution. No specimens were taken. A time out was conducted at 09:56, prior to the start of the procedure. A Minimum amount of bleeding was controlled with Pressure. The procedure was tolerated well with a pain level of 0 throughout and a pain level of 0 following the procedure. Patient s Level of Consciousness post procedure was recorded as Awake and Alert. Post Debridement Measurements: 13.6cm length x 9.4cm width  x 3.2cm depth; 321.297cm^3 volume. Post debridement Stage noted as Category/Stage IV. Character of Wound/Ulcer Post Debridement is improved. Post procedure Diagnosis Wound #1: Same as Pre-Procedure [name], [name] ([id number]) Plan Wound Cleansing: Wound #1 Right Gluteus: Clean wound with Normal Saline. Cleanse wound with mild soap and water Anesthetic (add to Medication List): Wound #1 Right Gluteus: Topical Lidocaine 4% cream applied to wound bed prior to debridement (In Clinic Only). Benzocaine Topical Anesthetic Spray applied to wound bed prior to debridement (In Clinic Only). Skin Barriers/Peri-Wound Care: Wound #1 Right Gluteus: Skin Prep Primary Wound Dressing: Wound #1 Right Gluteus: Saline moistened gauze - until wound vac reapplied at facility Silver Collagen - silver collagen to undermining area above ischial tuberosity under NPWT foam Secondary Dressing: Wound #1 Right Gluteus: ABD pad - 3rd layer in clinic only Dry Gauze - 2nd layer in clinic  only Other - secure with Tegaderm or tape in clinic only Dressing Change Frequency: Wound #1 Right Gluteus: Change Dressing Monday, Wednesday, Friday - NPWT Follow-up Appointments: Wound #1 Right Gluteus: Return Appointment in 2 [name]. Off-Loading: Wound #1 Right Gluteus: Mattress - Air mattress Turn and reposition every 2 hours Additional Orders / Instructions: Wound #1 Right Gluteus: Increase protein intake. Other: - Pro-stat for wound healing twice daily. Negative Pressure Wound Therapy: Wound #1 Right Gluteus: Wound VAC settings at 125/130 mmHg continuous pressure. Use BLACK/GREEN foam to wound cavity. Use WHITE foam to fill any tunnel/s and/or undermining. Change VAC dressing 3 X WEEK. Change canister as indicated when full. Nurse may titrate settings and frequency of dressing changes as clinically indicated. [facility] to reapply; white foam over bone Home Health Nurse may d/c VAC for s/s of increased infection, significant wound regression, or uncontrolled drainage. Notify [facility] at [phone]. #1 moistened silver collagen to the undermining area superior to the right ischial tuberosity #2 otherwise no change to the wound VAC orders #3 I asked the facility for follow-up on his serum albumin/prealbumin I think when he first came in all see if they have these results LONDYN, [name] ([id number]) Electronic Signature(s) Signed: 06/22/2018 5:35:06 PM By: Ryan Najjar MD Entered By: [name] on 06/22/2018 10:15:41 [name], [name] ([id number]) -------------------------------------------------------------------------------- SuperBill Details Patient Name: [name] Date of Service: 06/22/2018 Medical Record Number: [id number] Patient Account Number: [id number] Date of Birth/Sex: December 24, 1955 (62 y.o. 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

## 2018-07-06 ENCOUNTER — Encounter: Payer: Medicaid Other | Attending: Internal Medicine | Admitting: Internal Medicine

## 2018-07-06 DIAGNOSIS — E43 Unspecified severe protein-calorie malnutrition: Secondary | ICD-10-CM | POA: Insufficient documentation

## 2018-07-06 DIAGNOSIS — I714 Abdominal aortic aneurysm, without rupture: Secondary | ICD-10-CM | POA: Diagnosis not present

## 2018-07-06 DIAGNOSIS — I110 Hypertensive heart disease with heart failure: Secondary | ICD-10-CM | POA: Insufficient documentation

## 2018-07-06 DIAGNOSIS — I255 Ischemic cardiomyopathy: Secondary | ICD-10-CM | POA: Diagnosis not present

## 2018-07-06 DIAGNOSIS — M8618 Other acute osteomyelitis, other site: Secondary | ICD-10-CM | POA: Diagnosis not present

## 2018-07-06 DIAGNOSIS — Z8673 Personal history of transient ischemic attack (TIA), and cerebral infarction without residual deficits: Secondary | ICD-10-CM | POA: Diagnosis not present

## 2018-07-06 DIAGNOSIS — I252 Old myocardial infarction: Secondary | ICD-10-CM | POA: Diagnosis not present

## 2018-07-06 DIAGNOSIS — I509 Heart failure, unspecified: Secondary | ICD-10-CM | POA: Insufficient documentation

## 2018-07-06 DIAGNOSIS — L89314 Pressure ulcer of right buttock, stage 4: Secondary | ICD-10-CM | POA: Diagnosis not present

## 2018-07-06 DIAGNOSIS — I2510 Atherosclerotic heart disease of native coronary artery without angina pectoris: Secondary | ICD-10-CM | POA: Insufficient documentation

## 2018-07-06 DIAGNOSIS — M069 Rheumatoid arthritis, unspecified: Secondary | ICD-10-CM | POA: Diagnosis not present

## 2018-07-06 DIAGNOSIS — M8580 Other specified disorders of bone density and structure, unspecified site: Secondary | ICD-10-CM | POA: Insufficient documentation

## 2018-07-06 DIAGNOSIS — Z7952 Long term (current) use of systemic steroids: Secondary | ICD-10-CM | POA: Diagnosis not present

## 2018-07-10 NOTE — Progress Notes (Signed)
MONDRE, CROME ([id number]) Visit Report for 07/06/2018 Arrival Information Details Patient Name: [name], [name] Date of Service: 07/06/2018 10:00 AM Medical Record Number: [id number] Patient Account Number: [id number] Date of Birth/Sex: [birth year] (62 y.o. M) Treating RN: Renne Crigler Primary Care Pete Schnitzer: Sindy Messing Other Clinician: Referring Eyoel Throgmorton: Sindy Messing Treating Charlane Westry/Extender: Altamese [place] in Treatment: 5 Visit Information History Since Last Visit All ordered tests and consults were completed: No Patient Arrived: Stretcher Added or deleted any medications: No Arrival Time: 10:16 Any new allergies or adverse reactions: No Transfer Assistance: Stretcher Had a fall or experienced change in No Patient Identification Verified: Yes activities of daily living that may affect Secondary Verification Process Completed: Yes risk of falls: Patient Requires Transmission-Based No Signs or symptoms of abuse/neglect since last visito No Precautions: Hospitalized since last visit: No Patient Has Alerts: No Implantable device outside of the clinic excluding No cellular tissue based products placed in the center since last visit: Pain Present Now: Yes Electronic Signature(s) Signed: 07/06/2018 12:03:29 PM By: Renne Crigler Entered By: Renne Crigler on 07/06/2018 10:18:36 Hayashi, Bard Lacretia Nicks ([id number]) -------------------------------------------------------------------------------- Clinic Level of Care Assessment Details Patient Name: [name] Date of Service: 07/06/2018 10:00 AM Medical Record Number: [id number] Patient Account Number: [id number] Date of Birth/Sex: Apr 04, 1956 (62 y.o. M) Treating RN: Huel Coventry Primary Care Gaynor Ferreras: Sindy Messing Other Clinician: Referring Alazne Quant: Sindy Messing Treating Lexx Monte/Extender: Altamese [place] in Treatment: 5 Clinic Level of Care Assessment Items TOOL 4 Quantity Score []  - Use when only  an EandM is performed on FOLLOW-UP visit 0 ASSESSMENTS - Nursing Assessment / Reassessment X - Reassessment of Co-morbidities (includes updates in patient status) 1 10 X- 1 5 Reassessment of Adherence to Treatment Plan ASSESSMENTS - Wound and Skin Assessment / Reassessment X - Simple Wound Assessment / Reassessment - one wound 1 5 []  - 0 Complex Wound Assessment / Reassessment - multiple wounds []  - 0 Dermatologic / Skin Assessment (not related to wound area) ASSESSMENTS - Focused Assessment []  - Circumferential Edema Measurements - multi extremities 0 []  - 0 Nutritional Assessment / Counseling / Intervention []  - 0 Lower Extremity Assessment (monofilament, tuning fork, pulses) []  - 0 Peripheral Arterial Disease Assessment (using hand held doppler) ASSESSMENTS - Ostomy and/or Continence Assessment and Care []  - Incontinence Assessment and Management 0 []  - 0 Ostomy Care Assessment and Management (repouching, etc.) PROCESS - Coordination of Care X - Simple Patient / Family Education for ongoing care 1 15 []  - 0 Complex (extensive) Patient / Family Education for ongoing care X- 1 10 Staff obtains Chiropractor, Records, Test Results / Process Orders []  - 0 Staff telephones HHA, Nursing Homes / Clarify orders / etc []  - 0 Routine Transfer to another Facility (non-emergent condition) []  - 0 Routine Hospital Admission (non-emergent condition) []  - 0 New Admissions / Manufacturing engineer / Ordering NPWT, Apligraf, etc. []  - 0 Emergency Hospital Admission (emergent condition) X- 1 10 Simple Discharge Coordination DODARO, Hesston W. ([id number]) []  - 0 Complex (extensive) Discharge Coordination PROCESS - Special Needs []  - Pediatric / Minor Patient Management 0 []  - 0 Isolation Patient Management []  - 0 Hearing / Language / Visual special needs []  - 0 Assessment of Community assistance (transportation, D/C planning, etc.) []  - 0 Additional assistance / Altered mentation []   - 0 Support Surface(s) Assessment (bed, cushion, seat, etc.) INTERVENTIONS - Wound Cleansing / Measurement X - Simple Wound Cleansing - one wound 1 5 X- 1 5 Complex Wound Cleansing -  multiple wounds X- 1 5 Wound Imaging (photographs - any number of wounds) []  - 0 Wound Tracing (instead of photographs) []  - 0 Simple Wound Measurement - one wound X- 1 5 Complex Wound Measurement - multiple wounds INTERVENTIONS - Wound Dressings []  - Small Wound Dressing one or multiple wounds 0 X- 1 15 Medium Wound Dressing one or multiple wounds []  - 0 Large Wound Dressing one or multiple wounds []  - 0 Application of Medications - topical []  - 0 Application of Medications - injection INTERVENTIONS - Miscellaneous []  - External ear exam 0 []  - 0 Specimen Collection (cultures, biopsies, blood, body fluids, etc.) []  - 0 Specimen(s) / Culture(s) sent or taken to Lab for analysis []  - 0 Patient Transfer (multiple staff / / Similar devices) []  - 0 Simple Staple / Suture removal (25 or less) []  - 0 Complex Staple / Suture removal (26 or more) []  - 0 Hypo / Hyperglycemic Management (close monitor of Blood Glucose) []  - 0 Ankle / Brachial Index (ABI) - do not check if billed separately X- 1 5 Vital Signs Tomassi, Rajon W. ( ) Has the patient been seen at the hospital within the last three years: Yes Total Score: 95 Level Of Care: New/Established - Level 3 Electronic Signature(s) Signed: 07/07/2018 5:22:10 PM By: , BSN, RN, CWS, Kim RN, BSN Entered By: , BSN, RN, CWS, Kim on 07/06/2018 10:42:55 Elster, ( ) -------------------------------------------------------------------------------- Encounter Discharge Information Details Patient Name: [name], [name] Date of Service: 07/06/2018 10:00 AM Medical Record Number: Nurse, adult Patient Account Number: Date of Birth/Sex: 1956/06/04 (62 y.o. M) Treating RN: Primary Care  Holliday Sheaffer: [id number] Other Clinician: Referring Anikin Prosser: 09/06/2018 Treating Allura Doepke/Extender: Elliot Gurney in Treatment: 5 Encounter Discharge Information Items Discharge Condition: Stable Ambulatory Status: Stretcher Discharge Destination: Skilled Nursing Facility Telephoned: No Orders Sent: Yes Transportation: Ambulance Accompanied By: self Schedule Follow-up Appointment: Yes Clinical Summary of Care: Electronic Signature(s) Signed: 07/06/2018 2:01:20 PM By: 09/05/2018 Entered By: [name] on 07/06/2018 14:01:19 Forster, [name] (09/05/2018) -------------------------------------------------------------------------------- Lower Extremity Assessment Details Patient Name: [id number]. Date of Service: 07/06/2018 10:00 AM Medical Record Number: 10/20/1956 Patient Account Number: (67 Date of Birth/Sex: 1956/02/13 (62 y.o. M) Treating RN: Sindy Messing Primary Care Katryn Plummer: Altamese  Other Clinician: Referring Koen Antilla: 09/05/2018 Treating Kieth Hartis/Extender: Curtis Sites Weeks in Treatment: 5 Electronic Signature(s) Signed: 07/06/2018 12:03:29 PM By: 09/05/2018 Entered By: [name] on 07/06/2018 10:30:51 Urbanski, [name] (09/05/2018) -------------------------------------------------------------------------------- Multi Wound Chart Details Patient Name: [id number]. Date of Service: 07/06/2018 10:00 AM Medical Record Number: 10/20/1956 Patient Account Number: (67 Date of Birth/Sex: 08-Aug-1956 (62 y.o. M) Treating RN: Sindy Messing Primary Care Lakeia Bradshaw: Maxwell Caul Other Clinician: Referring Arfa Lamarca: 09/05/2018 Treating Teshaun Olarte/Extender: Renne Crigler Weeks in Treatment: 5 Vital Signs Height(in): 73 Pulse(bpm): 95 Weight(lbs): 206 Blood Pressure(mmHg): 117/72 Body Mass Index(BMI): 27 Temperature(F): 98.3 Respiratory Rate 18 (breaths/min): Photos: [1:No Photos] [N/A:N/A] Wound Location:  [1:Right Gluteus] [N/A:N/A] Wounding Event: [1:Gradually Appeared] [N/A:N/A] Primary Etiology: [1:Pressure Ulcer] [N/A:N/A] Comorbid History: [1:Cataracts, Congestive Heart Failure, Coronary Artery Disease, Hypertension, Myocardial Infarction, History of pressure wounds, Rheumatoid Arthritis] [N/A:N/A] Date Acquired: [1:05/09/2018] [N/A:N/A] Weeks of Treatment: [1:5] [N/A:N/A] Wound Status: [1:Open] [N/A:N/A] Measurements L x W x D [1:11x8x3.2] [N/A:N/A] (cm) Area (cm) : [1:69.115] [N/A:N/A] Volume (cm) : [1:221.168] [N/A:N/A] % Reduction in Area: [1:43.70%] [N/A:N/A] % Reduction in Volume: [1:59.10%] [N/A:N/A] Classification: [1:Category/Stage IV] [N/A:N/A] Exudate Amount: [1:Large] [N/A:N/A] Exudate Type: [1:Purulent] [N/A:N/A]  Exudate Color: [1:yellow, brown, green] [N/A:N/A] Wound Margin: [1:Distinct, outline attached] [N/A:N/A] Granulation Amount: [1:Large (67-100%)] [N/A:N/A] Granulation Quality: [1:Red, Pink, Hyper-granulation] [N/A:N/A] Necrotic Amount: [1:Small (1-33%)] [N/A:N/A] Exposed Structures: [1:Fascia: Yes Fat Layer (Subcutaneous Tissue) Exposed: Yes Muscle: Yes Bone: Yes] [N/A:N/A] Epithelialization: [1:None] [N/A:N/A] Periwound Skin Texture: [1:Scarring: Yes Excoriation: No Induration: No Callus: No] [N/A:N/A] Crepitus: No Rash: No Periwound Skin Moisture: Maceration: No N/A N/A Dry/Scaly: No Periwound Skin Color: Atrophie Blanche: No N/A N/A Cyanosis: No Ecchymosis: No Erythema: No Hemosiderin Staining: No Mottled: No Pallor: No Rubor: No Temperature: No Abnormality N/A N/A Tenderness on Palpation: Yes N/A N/A Wound Preparation: Ulcer Cleansing: N/A N/A Rinsed/Irrigated with Saline Topical Anesthetic Applied: Other: lidocaine 4% Treatment Notes Electronic Signature(s) Signed: 07/07/2018 5:22:10 PM By: Elliot Gurney, BSN, RN, CWS, Kim RN, BSN Entered By: Elliot Gurney, BSN, RN, CWS, Kim on 07/06/2018 10:40:11 [name]  ([id number]) -------------------------------------------------------------------------------- Multi-Disciplinary Care Plan Details Patient Name: [name], [name] Date of Service: 07/06/2018 10:00 AM Medical Record Number: [id number] Patient Account Number: [id number] Date of Birth/Sex: 19-Jan-1956 (62 y.o. M) Treating RN: Huel Coventry Primary Care Jarika Robben: Sindy Messing Other Clinician: Referring Chloe Flis: Sindy Messing Treating Ajahni Nay/Extender: Altamese [place] in Treatment: 5 Active Inactive ` Necrotic Tissue Nursing Diagnoses: Knowledge deficit related to management of necrotic/devitalized tissue Goals: Necrotic/devitalized tissue will be minimized in the wound bed Date Initiated: 06/15/2018 Target Resolution Date: 06/29/2018 Goal Status: Active Interventions: Assess patient pain level pre-, during and post procedure and prior to discharge Treatment Activities: Apply topical anesthetic as ordered : 06/15/2018 Excisional debridement : 06/15/2018 Notes: ` Orientation to the Wound Care Program Nursing Diagnoses: Knowledge deficit related to the wound healing center program Goals: Patient/caregiver will verbalize understanding of the Wound Healing Center Program Date Initiated: 06/15/2018 Target Resolution Date: 07/01/2018 Goal Status: Active Interventions: Provide education on orientation to the wound center Notes: ` Pressure Nursing Diagnoses: Knowledge deficit related to management of pressures ulcers Goals: Patient will remain free from development of additional pressure ulcers AMAZIAH, RAISANEN ([id number]) Date Initiated: 06/15/2018 Target Resolution Date: 07/01/2018 Goal Status: Active Interventions: Assess: immobility, friction, shearing, incontinence upon admission and as needed Notes: ` Wound/Skin Impairment Nursing Diagnoses: Impaired tissue integrity Goals: Ulcer/skin breakdown will have a volume reduction of 80% by week 12 Date Initiated:  06/15/2018 Target Resolution Date: 09/15/2018 Goal Status: Active Interventions: Assess ulceration(s) every visit Treatment Activities: Skin care regimen initiated : 06/15/2018 Notes: Electronic Signature(s) Signed: 07/07/2018 5:22:10 PM By: Elliot Gurney, BSN, RN, CWS, Kim RN, BSN Entered By: Elliot Gurney, BSN, RN, CWS, Kim on 07/06/2018 10:40:00 [name] ([id number]) -------------------------------------------------------------------------------- Pain Assessment Details Patient Name: [name], [name] Date of Service: 07/06/2018 10:00 AM Medical Record Number: [id number] Patient Account Number: [id number] Date of Birth/Sex: Jan 15, 1956 (62 y.o. M) Treating RN: Renne Crigler Primary Care Nikoleta Dady: Sindy Messing Other Clinician: Referring Drexler Maland: Sindy Messing Treating Analiza Cowger/Extender: Altamese [place] in Treatment: 5 Active Problems Location of Pain Severity and Description of Pain Patient Has Paino Yes Site Locations Pain Location: Pain in Ulcers Rate the pain. Current Pain Level: 8 Character of Pain Describe the Pain: Burning Pain Management and Medication Current Pain Management: Electronic Signature(s) Signed: 07/06/2018 12:03:29 PM By: Renne Crigler Entered By: Renne Crigler on 07/06/2018 10:18:51 [place], [name] ([id number]) -------------------------------------------------------------------------------- Patient/Caregiver Education Details Patient Name: [name] Date of Service: 07/06/2018 10:00 AM Medical Record Number: [id number] Patient Account Number: [id number] Date of Birth/Gender: 1955-11-11 (62 y.o. M) Treating RN: Curtis Sites Primary Care Physician: Sindy Messing Other Clinician: Referring Physician:  GOMEZ, ROGER Treating Physician/Extender: Altamese [place] in Treatment: 5 Education Assessment Education Provided To: Caregiver snf nurses Education Topics Provided Wound/Skin Impairment: Handouts: Other: wound care  orders Methods: Clinical cytogeneticist) Signed: 07/06/2018 4:57:10 PM By: Curtis Sites Entered By: Curtis Sites on 07/06/2018 14:01:38 Orlowski, Locke W. ([id number]) -------------------------------------------------------------------------------- Wound Assessment Details Patient Name: [name] Date of Service: 07/06/2018 10:00 AM Medical Record Number: [id number] Patient Account Number: [id number] Date of Birth/Sex: 25-Dec-1955 (62 y.o. M) Treating RN: Renne Crigler Primary Care Devine Klingel: Sindy Messing Other Clinician: Referring Ronya Gilcrest: Sindy Messing Treating Tyia Binford/Extender: Maxwell Caul Weeks in Treatment: 5 Wound Status Wound Number: 1 Primary Pressure Ulcer Etiology: Wound Location: Right Gluteus Wound Open Wounding Event: Gradually Appeared Status: Date Acquired: 05/09/2018 Comorbid Cataracts, Congestive Heart Failure, Coronary Weeks Of Treatment: 5 History: Artery Disease, Hypertension, Myocardial Clustered Wound: No Infarction, History of pressure wounds, Rheumatoid Arthritis Photos Photo Uploaded By: Renne Crigler on 07/06/2018 11:59:34 Wound Measurements Length: (cm) 11 Width: (cm) 8 Depth: (cm) 3.2 Area: (cm) 69.115 Volume: (cm) 221.168 % Reduction in Area: 43.7% % Reduction in Volume: 59.1% Epithelialization: None Tunneling: No Undermining: No Wound Description Classification: Category/Stage IV Wound Margin: Distinct, outline attached Exudate Amount: Large Exudate Type: Purulent Exudate Color: yellow, brown, green Foul Odor After Cleansing: No Slough/Fibrino Yes Wound Bed Granulation Amount: Large (67-100%) Exposed Structure Granulation Quality: Red, Pink, Hyper-granulation Fascia Exposed: Yes Necrotic Amount: Small (1-33%) Fat Layer (Subcutaneous Tissue) Exposed: Yes Necrotic Quality: Adherent Slough Muscle Exposed: Yes Necrosis of Muscle: No Bone Exposed: Yes Periwound Skin Texture Donlan, Onaje W.  ([id number]) Texture Color No Abnormalities Noted: No No Abnormalities Noted: No Callus: No Atrophie Blanche: No Crepitus: No Cyanosis: No Excoriation: No Ecchymosis: No Induration: No Erythema: No Rash: No Hemosiderin Staining: No Scarring: Yes Mottled: No Pallor: No Moisture Rubor: No No Abnormalities Noted: No Dry / Scaly: No Temperature / Pain Maceration: No Temperature: No Abnormality Tenderness on Palpation: Yes Wound Preparation Ulcer Cleansing: Rinsed/Irrigated with Saline Topical Anesthetic Applied: Other: lidocaine 4%, Treatment Notes Wound #1 (Right Gluteus) 1. Cleansed with: Clean wound with Normal Saline 2. Anesthetic Topical Lidocaine 4% cream to wound bed prior to debridement 4. Dressing Applied: Prisma Ag 5. Secondary Dressing Applied ABD Pad Dry Gauze 7. Secured with Secretary/administrator) Signed: 07/06/2018 12:03:29 PM By: Renne Crigler Entered By: Renne Crigler on 07/06/2018 10:30:43 [name] ([id number]) -------------------------------------------------------------------------------- Vitals Details Patient Name: [name] Date of Service: 07/06/2018 10:00 AM Medical Record Number: [id number] Patient Account Number: [id number] Date of Birth/Sex: 1955-11-22 (62 y.o. M) Treating RN: Renne Crigler Primary Care Sheetal Lyall: Sindy Messing Other Clinician: Referring Rayona Sardinha: Sindy Messing Treating Shalen Petrak/Extender: Altamese [place] in Treatment: 5 Vital Signs Time Taken: 10:18 Temperature (F): 98.3 Height (in): 73 Pulse (bpm): 95 Weight (lbs): 206 Respiratory Rate (breaths/min): 18 Body Mass Index (BMI): 27.2 Blood Pressure (mmHg): 117/72 Reference Range: 80 - 120 mg / dl Electronic Signature(s) Signed: 07/06/2018 12:03:29 PM By: Renne Crigler Entered By: Renne Crigler on 07/06/2018 10:19:22

## 2018-07-10 NOTE — Progress Notes (Signed)
ANTERO, DEROSIA ([id number]) Visit Report for 07/06/2018 HPI Details Patient Name: [name], [name] Date of Service: 07/06/2018 10:00 AM Medical Record Number: [id number] Patient Account Number: [id number] Date of Birth/Sex: 1956-05-15 (62 y.o. M) Treating RN: Huel Coventry Primary Care Provider: Sindy Messing Other Clinician: Referring Provider: Sindy Messing Treating Provider/Extender: Altamese [place] in Treatment: 5 History of Present Illness HPI Description: ADMISSION 06/01/18 This is a 62 year old man who is sent to [place] currently residing at [facility] skilled facility. He has a complicated recent medical history. His sister accompanies him tells [place] that he has been somewhat disabled since suffering a left basal ganglial CVA in 2018. At that point in time he also had myocardial infarctions. More recently he has been hospitalized from 04/04/18 through 04/12/18 with septic shock, coronary artery disease, congestive heart failure and an acute CVA. During this hospitalization he was noted to have sacral wounds but these were not felt to be the source of infection however the source of the infection was never really determined at that point. I believe he was sent to a skilled facility. He reports that at the hospital on 05/09/18 through 05/18/18. At that point he was noted to have a large abscess of the right buttock. He required a surgical IandD by Dr. Gerrit Friends of general surgery. Culture of this grew Citrobacter,Eikenella and Bacteroides. He had a CT scan of the pelvis that showed an abnormal soft tissue collection just caudal to the right ischial tuberosity measuring 5.9 and by 4.7. This also involved the distal right deep muscles. There was some cortical bone loss on the right ischial tuberosity and there was some concern with osteomyelitis. He was felt to require treatment for osteomyelitis based on clinical and radiographic findings. He did not have an MRI. Past medical history includes;  abdominal aortic aneurysm, carotid stenosis, hypertension, osteoarthritis, interstitial lung disease on chronic steroids, rheumatoid arthritis, congestive heart failure/ischemic cardiomyopathy with an ejection fraction of 35-40% Also noteworthy is on 05/09/18 his albumin was 2.2 on admission to hospital. I don't think this is actually been repeated. The patient states his appetite is good and he is taking supplements 100%. 06/15/18-He is seen in follow up vibration for right buttock ulcer; this is stable in appearance with minimal amount of non-viable tissue. 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 We've been using a wound VAC. This is centered over the right ischial tuberosity. He is making generally good progress. He was severely hypoalbuminemic at one time although he states that he eats well and takes his supplements. 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 We have been using silver collagen over the superior aspect of the right ischial tuberosity under wound VAC.he has made some nice improvements. The dimensions of the wound are certainly smaller. He has no exposed bone. We received lab work from [employer]. His prealbumin was 18.6 and albumin at 2.57. This is obviously still low. I am not sure what interventions are in place. I know he is on supplements. I discussed this with the patient. I told him that the albumin represents already moderate to severe protein malnutrition. This would contribute it to do difficulty healing the wound Electronic Signature(s) Signed: 07/06/2018 4:51:46 PM By: Baltazar Najjar MD Entered By: Baltazar Najjar on 07/06/2018 10:48:56 [place], [name] ([id number]) -------------------------------------------------------------------------------- Physical Exam Details Patient Name:  [name], [name] Date of Service: 07/06/2018 10:00 AM Medical Record Number: [id number] Patient Account Number: [id number] Date of Birth/Sex: November 24, 1955 (62 y.o. M) Treating RN: Huel Coventry Primary Care Provider: Sindy Messing Other Clinician: Referring Provider: Sindy Messing Treating Provider/Extender: Maxwell Caul Weeks in Treatment: 5 Constitutional Sitting or standing Blood Pressure is within target range for patient.. Pulse regular and within target range for patient.[name] Respirations regular, he has oxygen on at 2 L but no respiratory distress. Temperature is normal and within the target range for the patient.[name] appears in no distress. Respiratory Respiratory effort is easy and symmetric bilaterally. Rate is normal at rest and on room air.. bibasilar crackles. This is not sound like heart failure.. Cardiovascular he does not appear to be dehydrated. Heart sounds are normal. Gastrointestinal (GI) no tenderness no masses. Integumentary (Hair, Skin) no primary skin issues are seen. Psychiatric Patient appears depressed today.. Notes wound exam; large wound over the right buttock centered over the ischial tuberosity. There is less undermining over the ischial tuberosity itself. There is no exposed bone. Base of the wound looks healthy. Overall dimensions improved. There is no evidence of soft tissue infection around the wound. Electronic Signature(s) Signed: 07/06/2018 4:51:46 PM By: Baltazar Najjar MD Entered By: Baltazar Najjar on 07/06/2018 10:51:15 [name] ([id number]) -------------------------------------------------------------------------------- Physician Orders Details Patient Name: [name], [name] Date of Service: 07/06/2018 10:00 AM Medical Record Number: [id number] Patient Account Number: [id number] Date of Birth/Sex: Mar 20, 1956 (62 y.o. M) Treating RN: Huel Coventry Primary Care Provider: Sindy Messing Other Clinician: Referring Provider: Sindy Messing Treating  Provider/Extender: Altamese  in Treatment: 5 Verbal / Phone Orders: No Diagnosis Coding Wound Cleansing Wound #1 Right Gluteus o Clean wound with Normal Saline. o Cleanse wound with mild soap and water Anesthetic (add to Medication List) Wound #1 Right Gluteus o Topical Lidocaine 4% cream applied to wound bed prior to debridement (In Clinic Only). Skin Barriers/Peri-Wound Care Wound #1 Right Gluteus o Skin Prep Primary Wound Dressing Wound #1 Right Gluteus o Saline moistened gauze - until wound vac reapplied at facility o Silver Collagen - silver collagen to undermining area above ischial tuberosity under NPWT foam Secondary Dressing Wound #1 Right Gluteus o ABD pad - 3rd layer in clinic only o Dry Gauze - 2nd layer in clinic only o Other - secure with Tegaderm or tape in clinic only Dressing Change Frequency Wound #1 Right Gluteus o Change Dressing Monday, Wednesday, Friday - NPWT Follow-up Appointments Wound #1 Right Gluteus o Return Appointment in 2 weeks. Off-Loading Wound #1 Right Gluteus o Mattress - Air mattress o Turn and reposition every 2 hours Additional Orders / Instructions Wound #1 Right Gluteus o Increase protein intake. o Other: - Pro-stat for wound healing twice daily. [name], [name] ([id number]) Negative Pressure Wound Therapy Wound #1 Right Gluteus o Wound VAC settings at 125/130 mmHg continuous pressure. Use BLACK/GREEN foam to wound cavity. Use WHITE foam to fill any tunnel/s and/or undermining. Change VAC dressing 3 X WEEK. Change canister as indicated when full. Nurse may titrate settings and frequency of dressing changes as clinically indicated. [facility] to reapply; white foam over bone o Home Health Nurse may d/c VAC for s/s of increased infection, significant wound regression, or uncontrolled drainage. Notify [facility] at [phone]. Electronic Signature(s) Signed:  07/06/2018 4:51:46 PM By: Baltazar Najjar MD Signed: 07/07/2018 5:22:10 PM By: Elliot Gurney, BSN, RN, CWS, Kim RN, BSN Entered By: Elliot Gurney, BSN, RN, CWS, Kim on 07/06/2018 10:41:57  [name], [name] ([id number]) -------------------------------------------------------------------------------- Problem List Details Patient Name: [name], [name] Date of Service: 07/06/2018 10:00 AM Medical Record Number: [id number] Patient Account Number: [id number] Date of Birth/Sex: [birth year] (62 y.o. M) Treating RN: Huel Coventry Primary Care Provider: Sindy Messing Other Clinician: Referring Provider: Sindy Messing Treating Provider/Extender: Altamese  in Treatment: 5 Active Problems ICD-10 Evaluated Encounter Code Description Active Date Today Diagnosis L89.314 Pressure ulcer of right buttock, stage 4 06/01/2018 No Yes M86.18 Other acute osteomyelitis, other site 06/01/2018 No Yes T81.31XD Disruption of external operation (surgical) wound, not 06/01/2018 No Yes elsewhere classified, subsequent encounter E43 Unspecified severe protein-calorie malnutrition 06/01/2018 No Yes Inactive Problems Resolved Problems Electronic Signature(s) Signed: 07/06/2018 4:51:46 PM By: Baltazar Najjar MD Entered By: Baltazar Najjar on 07/06/2018 10:44:45 Rundquist, Bailee W. ([id number]) -------------------------------------------------------------------------------- Progress Note Details Patient Name: [name] Date of Service: 07/06/2018 10:00 AM Medical Record Number: [id number] Patient Account Number: [id number] Date of Birth/Sex: [birth year] (62 y.o. M) Treating RN: Huel Coventry Primary Care Provider: Sindy Messing Other Clinician: Referring Provider: Sindy Messing Treating Provider/Extender: Maxwell Caul Weeks in Treatment: 5 Subjective History of Present Illness (HPI) ADMISSION 06/01/18 This is a 62 year old man who is sent to [place] currently residing at [facility] skilled facility. He has a complicated  recent medical history. His sister accompanies him tells [place] that he has been somewhat disabled since suffering a left basal ganglial CVA in 2018. At that point in time he also had myocardial infarctions. More recently he has been hospitalized from 04/04/18 through 04/12/18 with septic shock, coronary artery disease, congestive heart failure and an acute CVA. During this hospitalization he was noted to have sacral wounds but these were not felt to be the source of infection however the source of the infection was never really determined at that point. I believe he was sent to a skilled facility. He reports that at the hospital on 05/09/18 through 05/18/18. At that point he was noted to have a large abscess of the right buttock. He required a surgical IandD by Dr. Gerrit Friends of general surgery. Culture of this grew Citrobacter,Eikenella and Bacteroides. He had a CT scan of the pelvis that showed an abnormal soft tissue collection just caudal to the right ischial tuberosity measuring 5.9 and by 4.7. This also involved the distal right deep muscles. There was some cortical bone loss on the right ischial tuberosity and there was some concern with osteomyelitis. He was felt to require treatment for osteomyelitis based on clinical and radiographic findings. He did not have an MRI. Past medical history includes; abdominal aortic aneurysm, carotid stenosis, hypertension, osteoarthritis, interstitial lung disease on chronic steroids, rheumatoid arthritis, congestive heart failure/ischemic cardiomyopathy with an ejection fraction of 35-40% Also noteworthy is on 05/09/18 his albumin was 2.2 on admission to hospital. I don't think this is actually been repeated. The patient states his appetite is good and he is taking supplements 100%. 06/15/18-He is seen in follow up vibration for right buttock ulcer; this is stable in appearance with minimal amount of non-viable tissue. 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 We've been using a wound VAC. This is centered over the right ischial tuberosity. He is making generally good progress. He was severely hypoalbuminemic at one time although he states that he eats well and takes his supplements. 07/06/18; 2 week follow-up for a large stage IV right buttock  pressure ulcer/surgical IandD site. We have been using silver collagen over the superior aspect of the right ischial tuberosity under wound VAC.he has made some nice improvements. The dimensions of the wound are certainly smaller. He has no exposed bone. We received lab work from [employer]. His prealbumin was 18.6 and albumin at 2.57. This is obviously still low. I am not sure what interventions are in place. I know he is on supplements. I discussed this with the patient. I told him that the albumin represents already moderate to severe protein malnutrition. This would contribute it to do difficulty healing the wound Objective Constitutional [name], [name] ([id number]) Sitting or standing Blood Pressure is within target range for patient.. Pulse regular and within target range for patient.[name] Respirations regular, he has oxygen on at 2 L but no respiratory distress. Temperature is normal and within the target range for the patient.[name] appears in no distress. Vitals Time Taken: 10:18 AM, Height: 73 in, Weight: 206 lbs, BMI: 27.2, Temperature: 98.3 F, Pulse: 95 bpm, Respiratory Rate: 18 breaths/min, Blood Pressure: 117/72 mmHg. Respiratory Respiratory effort is easy and symmetric bilaterally. Rate is normal at rest and on room air.. bibasilar crackles. This is not sound like heart failure.. Cardiovascular he does not appear to be dehydrated. Heart sounds are normal. Gastrointestinal (GI) no tenderness no masses. Psychiatric Patient appears depressed today.. General Notes: wound exam;  large wound over the right buttock centered over the ischial tuberosity. There is less undermining over the ischial tuberosity itself. There is no exposed bone. Base of the wound looks healthy. Overall dimensions improved. There is no evidence of soft tissue infection around the wound. Integumentary (Hair, Skin) no primary skin issues are seen. Wound #1 status is Open. Original cause of wound was Gradually Appeared. The wound is located on the Right Gluteus. The wound measures 11cm length x 8cm width x 3.2cm depth; 69.115cm^2 area and 221.168cm^3 volume. There is bone, muscle, Fat Layer (Subcutaneous Tissue) Exposed, and fascia exposed. There is no tunneling or undermining noted. There is a large amount of purulent drainage noted. The wound margin is distinct with the outline attached to the wound base. There is large (67-100%) red, pink, hyper - granulation within the wound bed. There is a small (1-33%) amount of necrotic tissue within the wound bed including Adherent Slough. The periwound skin appearance exhibited: Scarring. The periwound skin appearance did not exhibit: Callus, Crepitus, Excoriation, Induration, Rash, Dry/Scaly, Maceration, Atrophie Blanche, Cyanosis, Ecchymosis, Hemosiderin Staining, Mottled, Pallor, Rubor, Erythema. Periwound temperature was noted as No Abnormality. The periwound has tenderness on palpation. Assessment Active Problems ICD-10 Pressure ulcer of right buttock, stage 4 Other acute osteomyelitis, other site Disruption of external operation (surgical) wound, not elsewhere classified, subsequent encounter Unspecified severe protein-calorie malnutrition Plan [name], [name] ([id number]) Wound Cleansing: Wound #1 Right Gluteus: Clean wound with Normal Saline. Cleanse wound with mild soap and water Anesthetic (add to Medication List): Wound #1 Right Gluteus: Topical Lidocaine 4% cream applied to wound bed prior to debridement (In Clinic Only). Skin  Barriers/Peri-Wound Care: Wound #1 Right Gluteus: Skin Prep Primary Wound Dressing: Wound #1 Right Gluteus: Saline moistened gauze - until wound vac reapplied at facility Silver Collagen - silver collagen to undermining area above ischial tuberosity under NPWT foam Secondary Dressing: Wound #1 Right Gluteus: ABD pad - 3rd layer in clinic only Dry Gauze - 2nd layer in clinic only Other - secure with Tegaderm or tape in clinic only Dressing Change Frequency: Wound #1 Right Gluteus: Change Dressing  Monday, Wednesday, Friday - NPWT Follow-up Appointments: Wound #1 Right Gluteus: Return Appointment in 2 weeks. Off-Loading: Wound #1 Right Gluteus: Mattress - Air mattress Turn and reposition every 2 hours Additional Orders / Instructions: Wound #1 Right Gluteus: Increase protein intake. Other: - Pro-stat for wound healing twice daily. Negative Pressure Wound Therapy: Wound #1 Right Gluteus: Wound VAC settings at 125/130 mmHg continuous pressure. Use BLACK/GREEN foam to wound cavity. Use WHITE foam to fill any tunnel/s and/or undermining. Change VAC dressing 3 X WEEK. Change canister as indicated when full. Nurse may titrate settings and frequency of dressing changes as clinically indicated. [facility] to reapply; white foam over bone Home Health Nurse may d/c VAC for s/s of increased infection, significant wound regression, or uncontrolled drainage. Notify [facility] at [phone]. #1 at this point we continue with the same dressing. Silver collagen over the ischial tuberosity with black foam under the wound VAC #2 his protein calorie malnutrition is worrisome. I did talk to him about this. However this may be an actual improvement from previous values when he was so ill Electronic Signature(s) Signed: 07/06/2018 4:51:46 PM By: Baltazar Najjar MD Entered By: Baltazar Najjar on 07/06/2018 10:52:17 [name], [name]  ([id number]) -------------------------------------------------------------------------------- SuperBill Details Patient Name: [name] Date of Service: 07/06/2018 Medical Record Number: [id number] Patient Account Number: [id number] Date of Birth/Sex: [birth year] (62 y.o. M) Treating RN: Huel Coventry Primary Care Provider: Sindy Messing Other Clinician: Referring Provider: Sindy Messing Treating Provider/Extender: Altamese [place] in Treatment: 5 Diagnosis Coding ICD-10 Codes Code Description L89.314 Pressure ulcer of right buttock, stage 4 M86.18 Other acute osteomyelitis, other site T81.31XD Disruption of external operation (surgical) wound, not elsewhere classified, subsequent encounter E43 Unspecified severe protein-calorie malnutrition Facility Procedures CPT4 Code: [id number] Description: 99213 - WOUND CARE VISIT-LEV 3 EST PT Modifier: Quantity: 1 Physician Procedures CPT4 Code: 9794801 Description: 99213 - WC PHYS LEVEL 3 - EST PT ICD-10 Diagnosis Description L89.314 Pressure ulcer of right buttock, stage 4 E43 Unspecified severe protein-calorie malnutrition Modifier: Quantity: 1 Electronic Signature(s) Signed: 07/06/2018 4:51:46 PM By: Baltazar Najjar MD Entered By: Baltazar Najjar on 07/06/2018 10:52:39

## 2018-07-11 NOTE — Progress Notes (Deleted)
[facility] [address] [place]. [place] [address] [phone]       OFFICE FOLLOW UP NOTE  [name] Date of Birth:  1956-03-28 Medical Record Number:  [id number]   Reason for Referral:  hospital stroke follow up  CHIEF COMPLAINT:  No chief complaint on file.   HPI: [name] is being seen today for initial visit in the office for left BG and posterior left centrum semi ovale infarct secondary to small vessel disease on 04/10/2018. History obtained from *** and chart review. Reviewed all radiology images and labs personally.  [name] is a 62 y.o. male with history of CVA, AAA, CAD, combined systolic and diastolic CHF, MI, ILD, lung nodule, tobacco use, and RA who presented to [facility] ED on 04/04/2018 due to generalized weakness.  He was found to be hypotensive, hyponatremic, hyperkalemic and in AKI.  Patient was started on broad-spectrum antibiotics but they were discontinued due to no source of infection.  On 04/10/2018, patient was found to have right lower extremity weakness.  MRI head reviewed and showed infarct involving the medial left basal ganglia and posterior left centrum semi ovale along with remote white matter infarcts and right corona radiata.  CTA did show long segment arthrosclerotic narrowing of the distal left common carotid artery to the left carotid bifurcation without a significant stenosis relative to the more distal vessel, previous right carotid enterectomy patent.  And high-grade stenosis of the proximal nondominant left vertebral artery.  2D echo showed an EF of 30 to 35% with severe hypokinesis to akinesis.  LDL 42 and A1c 5.2.  Patient was previously on aspirin 81 mg and it was recommended for DAPT for 3 weeks then Plavix alone.  Therapies recommended discharge to CIR but due to no long-term plan once discharged, patient was discharged to SNF.    ROS:   14 system review of systems performed and negative with exception of  ***  PMH:  Past Medical History:  Diagnosis Date  . AAA (abdominal aortic aneurysm) ([place])   . AKI (acute kidney injury) ([place])    a. Cr up to 8 in 04/2018.  . Arthritis   . CAD in native artery    a. h/o MI x 3 s/p CABG x 4 (1993 with Servando Snare). b. LHC 01/2017 with severe native disease, 2 grafts occluded.  . Carotid artery disease ([place])    a. s/p R CEA 11/2016.  [name] Chronic combined systolic and diastolic CHF (congestive heart failure) ([place])   . GERD (gastroesophageal reflux disease)   . Hyperkalemia   . ILD (interstitial lung disease) ([place])   . Lung nodule   . MI (myocardial infarction) ([place])    x 3   . Normocytic anemia   . NSVT (nonsustained ventricular tachycardia) ([place])   . Pancreatitis    1/19  . Pneumonia 11/2017  . RA (rheumatoid arthritis) ([place])   . Stroke ([place])    a. 2018. b. possible recurrence in 04/2018, seen by neuro  . Tobacco abuse     PSH:  Past Surgical History:  Procedure Laterality Date  . CARDIAC CATHETERIZATION    . CAROTID ENDARTERECTOMY Left   . Heart Bypass  10/1992  . INCISION AND DRAINAGE PERIRECTAL ABSCESS Right 05/09/2018   Procedure: IRRIGATION AND DEBRIDEMENT  RIGHT BUTTOCK ABSCESS;  Surgeon: Armandina Gemma, MD;  Location: [facility];  Service: General;  Laterality: Right;  . LEFT HEART CATH AND CORS/GRAFTS ANGIOGRAPHY N/A 03/01/2017   Procedure: Left  Heart Cath and Cors/Grafts Angiography;  Surgeon: Belva Crome, MD;  Location: [facility];  Service: Cardiovascular;  Laterality: N/A;  . Throat Biopsy     Cat scratch fever    Social History:  Social History   Socioeconomic History  . Marital status: Single    Spouse name: Not on file  . Number of children: 1  . Years of education: Not on file  . Highest education level: Not on file  Occupational History  . 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 Financial resource strain: Not on file  . Food insecurity:    Worry: Not on file    Inability:  Not on file  . Transportation needs:    Medical: Not on file    Non-medical: Not on file  Tobacco Use  . Smoking status: Current Every Day Smoker    Packs/day: 0.50    Years: 47.00    Pack years: 23.50    Types: Cigarettes  . Smokeless tobacco: Never Used  . Tobacco comment: half pack every other day  Substance and Sexual Activity  . Alcohol use: No    Comment: remote history of beer drinking  . Drug use: No  . Sexual activity: Not on file  Lifestyle  . Physical activity:    Days per week: Not on file    Minutes per session: Not on file  . Stress: Not on file  Relationships  . Social connections:    Talks on phone: Not on file    Gets together: Not on file    Attends religious service: Not on file    Active member of club or organization: Not on file    Attends meetings of clubs or organizations: Not on file    Relationship status: Not on file  . Intimate partner violence:    Fear of current or ex partner: Not on file    Emotionally abused: Not on file    Physically abused: Not on file    Forced sexual activity: Not on file  Other Topics Concern  . Not on file  Social History Narrative  . Not on file    Family History:  Family History  Problem Relation Age of Onset  . Heart murmur Mother   . Heart disease Mother   . Heart attack Mother   . Suicidality Father   . Heart attack Father   . Stroke Brother   . Heart attack Brother   . Heart attack Maternal Grandmother   . Cancer Maternal Grandmother        unknown type  . Lung cancer Maternal Aunt   . Lung cancer Maternal Uncle   . Cancer Maternal Aunt        unknown cancer  . Heart attack Brother     Medications:   Current Outpatient Medications on File Prior to Visit  Medication Sig Dispense Refill  . acetaminophen (TYLENOL) 500 MG tablet Take 1,000 mg by mouth every 6 (six) hours as needed for mild pain.     [name] albuterol (PROVENTIL) (2.5 MG/3ML) 0.083% nebulizer solution Take 2.5 mg by nebulization every 6  (six) hours as needed for wheezing or shortness of breath.    . Amino Acids-Protein Hydrolys (FEEDING SUPPLEMENT, PRO-STAT SUGAR FREE 64,) LIQD Take 30 mLs by mouth 2 (two) times daily. (Patient not taking: Reported on 06/04/2018) 900 mL 0  . atorvastatin (LIPITOR) 40 MG tablet Take 1 tablet (40 mg  total) by mouth daily at 6 PM. (Patient taking differently: Take 80 mg by mouth daily at 6 PM. ) 30 tablet 11  . carvedilol (COREG) 6.25 MG tablet Take 1 tablet (6.25 mg total) by mouth 2 (two) times daily with a meal. 60 tablet 0  . enoxaparin (LOVENOX) 40 MG/0.4ML injection Inject 0.4 mLs (40 mg total) into the skin daily. 0 Syringe   . ertapenem (INVANZ) IVPB Inject 1 g into the vein daily. Indication:  Gluteal abscess, abscess in right thigh adductors, pericholecytic abscess Last Day of Therapy:  07/15/18 Labs - Once weekly:  CBC/D and BMP, Labs - Every other week:  ESR and CRP 38 Units 0  . ertapenem (INVANZ) IVPB Invanz/Ertapenem 1 gm iv daily  Indication:  Wound infection Last Day of Therapy:  9/132019 Labs - Once weekly:  CBC/D and BMP, Labs - Every other week:  ESR and CRP 38 Units 0  . gabapentin (NEURONTIN) 300 MG capsule Take 1 capsule (300 mg total) by mouth every 8 (eight) hours. 90 capsule 4  . HYDROcodone-acetaminophen (NORCO) 10-325 MG tablet Take 1 tablet by mouth 3 (three) times daily. 15 tablet 0  . hydroxychloroquine (PLAQUENIL) 200 MG tablet Take 1 tablet (200 mg total) by mouth 2 (two) times daily. (Patient not taking: Reported on 06/04/2018)  0  . ipratropium-albuterol (DUONEB) 0.5-2.5 (3) MG/3ML SOLN Take 3 mLs by nebulization 2 (two) times daily. 360 mL   . magnesium hydroxide (MILK OF MAGNESIA) 400 MG/5ML suspension Take 30 mLs by mouth daily as needed for mild constipation. And QHS    . Menthol-Methyl Salicylate (MUSCLE RUB EX) Apply 1 application topically as needed (for shoulder or knee pain).     . mirtazapine (REMERON) 7.5 MG tablet Take 7.5 mg by mouth at bedtime.    [name]  morphine (MSIR) 30 MG tablet Take 1 tablet (30 mg total) by mouth every 4 (four) hours as needed (breakthrough pain). 12 tablet 0  . Multiple Vitamin (MULTIVITAMIN WITH MINERALS) TABS tablet Take 1 tablet by mouth daily. (Patient not taking: Reported on 06/04/2018)    . nicotine (NICODERM CQ - DOSED IN MG/24 HR) 7 mg/24hr patch Place 1 patch (7 mg total) onto the skin daily. 28 patch 0  . nutrition supplement, JUVEN, (JUVEN) PACK Take 1 packet by mouth 2 (two) times daily between meals.  0  . nystatin cream (MYCOSTATIN) Apply topically 2 (two) times daily. 30 g 0  . ondansetron (ZOFRAN) 4 MG tablet Take 1 tablet (4 mg total) by mouth every 6 (six) hours as needed for nausea. 20 tablet 0  . polyethylene glycol (MIRALAX / GLYCOLAX) packet Take 17 g by mouth 2 (two) times daily. Until having 1 soft BM daily, then can decrease to daily or as needed (Patient taking differently: Take 17 g by mouth daily. Until having 1 soft BM daily, then can decrease to daily or as needed) 14 each 0  . potassium chloride SA (K-DUR,KLOR-CON) 20 MEQ tablet Take 2 tablets (40 mEq total) by mouth daily. (Patient taking differently: Take 20 mEq by mouth daily. )    . predniSONE (DELTASONE) 10 MG tablet Take 10 mg by mouth daily with breakfast.    . PROVENTIL HFA 108 (90 Base) MCG/ACT inhaler INHALE 2 PUFFS EVERY 6 HOURS AS NEEDED FOR WHEEZE/SHORTNESS OF BREATH 6.7 g 6  . saccharomyces boulardii (FLORASTOR) 250 MG capsule Take 1 capsule (250 mg total) by mouth 2 (two) times daily.    . tamsulosin (FLOMAX) 0.4 MG  CAPS capsule Take 1 capsule (0.4 mg total) by mouth daily after supper. 30 capsule 4   No current facility-administered medications on file prior to visit.     Allergies:  No Known Allergies   Physical Exam  There were no vitals filed for this visit. There is no height or weight on file to calculate BMI. No exam data present  General: well developed, well nourished, seated, in no evident distress Head: head  normocephalic and atraumatic.   Neck: supple with no carotid or supraclavicular bruits Cardiovascular: regular rate and rhythm, no murmurs Musculoskeletal: no deformity Skin:  no rash/petichiae Vascular:  Normal pulses all extremities  Neurologic Exam Mental Status: Awake and fully alert. Oriented to place and time. Recent and remote memory intact. Attention span, concentration and fund of knowledge appropriate. Mood and affect appropriate.  Cranial Nerves: Fundoscopic exam reveals sharp disc margins. Pupils equal, briskly reactive to light. Extraocular movements full without nystagmus. Visual fields full to confrontation. Hearing intact. Facial sensation intact. Face, tongue, palate moves normally and symmetrically.  Motor: Normal bulk and tone. Normal strength in all tested extremity muscles. Sensory.: intact to touch , pinprick , position and vibratory sensation.  Coordination: Rapid alternating movements normal in all extremities. Finger-to-nose and heel-to-shin performed accurately bilaterally. Gait and Station: Arises from chair without difficulty. Stance is normal. Gait demonstrates normal stride length and balance . Able to heel, toe and tandem walk without difficulty.  Reflexes: 1+ and symmetric. Toes downgoing.    NIHSS  *** Modified [place]  ***   Diagnostic Data (Labs, Imaging, Testing)  MR BRAIN WO CONTRAST 04/10/2018 IMPRESSION: 1. Punctate nonhemorrhagic infarct involving the medial left basal ganglia. 2. Punctate nonhemorrhagic white matter infarct in the posterior left centrum semi ovale. 3. Remote white matter infarcts in the right corona radiata. This could be a watershed insult. 4. Moderate atrophy and white matter disease otherwise compatible with chronic microvascular ischemia.  CT ANGIO HEAD W OR WO CONTRAST CT ANGIO NECK W OR WO CONTRAST 04/10/2018 IMPRESSION: 1. Long segment atherosclerotic narrowing of the distal left common carotid artery through the left  carotid bifurcation without a significant stenosis relative to the more distal vessel. Although there is no significant stenosis. The extensive luminal plaque could be a source for distal emboli. 2. Previous right carotid endarterectomy is patent. 3. Atherosclerotic changes at the aortic arch and bilateral cavernous internal carotid arteries without significant stenosis. 4. 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 5. Mild distal small vessel disease within the circle-of-Willis without significant proximal stenosis, aneurysm, or branch vessel occlusion. 6. Degenerative changes of the cervical spine with rightward curvature and asymmetric left-sided facet arthropathy. 7. Changes of interstitial lung disease noted.  ECHOCARDIOGRAM 04/05/2018 Study Conclusions - Left ventricle: The cavity size was normal. There was moderate   concentric hypertrophy. Systolic function was moderately to   severely reduced. The estimated ejection fraction was in the   range of 30% to 35%. There is severe hypokinesis to akinesis of   the entire inferolateral, inferior, and inferoseptal myocardium.   Features are consistent with a pseudonormal left ventricular   filling pattern, with concomitant abnormal relaxation and   increased filling pressure (grade 2 diastolic dysfunction).   Doppler parameters are consistent with high ventricular filling   pressure. - Aortic valve: Trileaflet; normal thickness, mildly calcified   leaflets. Valve area (VTI): 2.99 cm^2. Valve area (Vmax): 2.7   cm^2. Valve area (Vmean): 3.03 cm^2. - Mitral valve: There was mild regurgitation. -  Left atrium: The atrium was mildly dilated. - Right atrium: The atrium was mildly dilated. - Tricuspid valve: There was mild regurgitation. - Pulmonary arteries: PA peak pressure: 37 mm Hg (S).    ASSESSMENT: [name] is a 62 y.o. year old male here with left basal ganglia and posterior left centrum semi  ovale infarcts on 04/10/2018 secondary to small vessel disease. Vascular risk factors include HTN, prior CVA, CAD, CHF and MI.     PLAN: -Continue {anticoagulants:31417}  and ***  for secondary stroke prevention -F/u with PCP regarding your *** management -continue to monitor BP at home -advised to continue to stay active and maintain a healthy diet -Maintain strict control of hypertension with blood pressure goal below 130/90, diabetes with hemoglobin A1c goal below 6.5% and cholesterol with LDL cholesterol (bad cholesterol) goal below 70 mg/dL. I also advised the patient to eat a healthy diet with plenty of whole grains, cereals, fruits and vegetables, exercise regularly and maintain ideal body weight.  Follow up in *** or call earlier if needed   Greater than 50% of time during this 25 minute visit was spent on counseling,explanation of diagnosis of ***, reviewing risk factor management of ***, planning of further management, discussion with patient and family and coordination of care    Venancio Poisson, [facility]  [facility] [address] [place], [place] [address]  Phone [phone] Fax [phone] Note: This document was prepared with digital dictation and possible smart phrase technology. Any transcriptional errors that result from this process are unintentional.

## 2018-07-12 ENCOUNTER — Telehealth: Payer: Self-pay

## 2018-07-12 ENCOUNTER — Ambulatory Visit: Payer: Medicaid Other | Admitting: Adult Health

## 2018-07-12 ENCOUNTER — Encounter: Payer: Self-pay | Admitting: Adult Health

## 2018-07-12 NOTE — Telephone Encounter (Signed)
PT no show for appt today. 

## 2018-07-20 ENCOUNTER — Encounter: Payer: Medicaid Other | Admitting: Internal Medicine

## 2018-07-20 DIAGNOSIS — L89314 Pressure ulcer of right buttock, stage 4: Secondary | ICD-10-CM | POA: Diagnosis not present

## 2018-07-23 NOTE — Progress Notes (Signed)
[name], [name] ([id number]) Visit Report for 07/20/2018 HPI Details Patient Name: [name], [name] Date of Service: 07/20/2018 10:00 AM Medical Record Number: [id number] Patient Account Number: [id number] Date of Birth/Sex: [birth year] (62 y.o. M) Treating RN: Huel Coventry Primary Care Provider: Sindy Messing Other Clinician: Referring Provider: Sindy Messing Treating Provider/Extender: Altamese [place] in Treatment: 7 History of Present Illness HPI Description: ADMISSION 06/01/18 This is a 62 year old man who is sent to [place] currently residing at [facility] skilled facility. He has a complicated recent medical history. His sister accompanies him tells [place] that he has been somewhat disabled since suffering a left basal ganglial CVA in 2018. At that point in time he also had myocardial infarctions. More recently he has been hospitalized from 04/04/18 through 04/12/18 with septic shock, coronary artery disease, congestive heart failure and an acute CVA. During this hospitalization he was noted to have sacral wounds but these were not felt to be the source of infection however the source of the infection was never really determined at that point. I believe he was sent to a skilled facility. He reports that at the hospital on 05/09/18 through 05/18/18. At that point he was noted to have a large abscess of the right buttock. He required a surgical IandD by Dr. Gerrit Friends of general surgery. Culture of this grew Citrobacter,Eikenella and Bacteroides. He had a CT scan of the pelvis that showed an abnormal soft tissue collection just caudal to the right ischial tuberosity measuring 5.9 and by 4.7. This also involved the distal right deep muscles. There was some cortical bone loss on the right ischial tuberosity and there was some concern with osteomyelitis. He was felt to require treatment for osteomyelitis based on clinical and radiographic findings. He did not have an MRI. Past medical history includes;  abdominal aortic aneurysm, carotid stenosis, hypertension, osteoarthritis, interstitial lung disease on chronic steroids, rheumatoid arthritis, congestive heart failure/ischemic cardiomyopathy with an ejection fraction of 35-40% Also noteworthy is on 05/09/18 his albumin was 2.2 on admission to hospital. I don't think this is actually been repeated. The patient states his appetite is good and he is taking supplements 100%. 06/15/18-He is seen in follow up vibration for right buttock ulcer; this is stable in appearance with minimal amount of non-viable tissue. 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 We've been using a wound VAC. This is centered over the right ischial tuberosity. He is making generally good progress. He was severely hypoalbuminemic at one time although he states that he eats well and takes his supplements. 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 We have been using silver collagen over the superior aspect of the right ischial tuberosity under wound VAC.he has made some nice improvements. The dimensions of the wound are certainly smaller. He has no exposed bone. We received lab work from [facility]. His prealbumin was 18.6 and albumin at 2.57. This is obviously still low. I am not sure what interventions are in place. I know he is on supplements. I discussed this with the patient. I told him that the albumin represents already moderate to severe protein malnutrition. This would contribute it to do difficulty healing the wound 07/20/18; I received copies of lab work from [facility] on 07/06/18 documenting a sedimentation rate of 25 which seems to be in line with what I see  from previous values however his C-reactive protein was 110.8 versus 84.7 on 06/22/18. I have looked through [facility] health Link I  don't see a prior value for either inflammatory measure. he has been treated with [name] which I think he is still on for osteomyelitis of the right ischial tuberosity as well as a soft tissue collection just caudad to the right ischial tuberosity itself. Felt to have a soft tissue abscess and/or stigmata of necrotizing fasciitis. I'm not sure who is following this. His CT scan was on 05/09/18 yet he still remains on Invanz. Looking back through the original discharge summary the [name] was supposed to finish on 07/05/18 Electronic Signature(s) JULIOUS, LANGLOIS ([id number]) Signed: 07/20/2018 4:51:54 PM By: Baltazar Najjar MD Entered By: Baltazar Najjar on 07/20/2018 11:19:47 Ratto, [name] ([id number]) -------------------------------------------------------------------------------- Physical Exam Details Patient Name: [name], [name] Date of Service: 07/20/2018 10:00 AM Medical Record Number: [id number] Patient Account Number: [id number] Date of Birth/Sex: [birth year] (62 y.o. M) Treating RN: Huel Coventry Primary Care Provider: Sindy Messing Other Clinician: Referring Provider: Sindy Messing Treating Provider/Extender: Maxwell Caul [place] in Treatment: 7 Constitutional Sitting or standing Blood Pressure is within target range for patient.. Pulse regular and within target range for patient.[name] Respirations regular, non-labored and within target range.. Temperature is normal and within the target range for the patient.[name] appears in no distress. appears somewhat pale. Eyes Conjunctivae clear. No discharge. Respiratory Respiratory effort is easy and symmetric bilaterally. Rate is normal at rest and on room air.. Cardiovascular appears well-hydrated. Gastrointestinal (GI) Abdomen is soft and non-distended without masses or tenderness. Bowel sounds active in all quadrants.. Integumentary (Hair, Skin) no primary skin issues are seen. Psychiatric No evidence of depression, anxiety, or agitation.  Calm, cooperative, and communicative. Appropriate interactions and affect.. Notes wound exam; large wound over the right buttock centered about the right ischial tuberosity. There is less undermining and tunneling over the ischial tuberosity itself. I could not feel any exposed bone in this region. There is a small area in the middle of the wound was surface tear of the probably has underlying bone. I did not debride this. Electronic Signature(s) Signed: 07/20/2018 4:51:54 PM By: Baltazar Najjar MD Entered By: Baltazar Najjar on 07/20/2018 11:32:42 [name], [name] ([id number]) -------------------------------------------------------------------------------- Physician Orders Details Patient Name: [name], [name] Date of Service: 07/20/2018 10:00 AM Medical Record Number: [id number] Patient Account Number: [id number] Date of Birth/Sex: 04-19-1956 (62 y.o. M) Treating RN: Huel Coventry Primary Care Provider: Sindy Messing Other Clinician: Referring Provider: Sindy Messing Treating Provider/Extender: Altamese [place] in Treatment: 7 Verbal / Phone Orders: No Diagnosis Coding Wound Cleansing Wound #1 Right Gluteus o Clean wound with Normal Saline. o Cleanse wound with mild soap and water Anesthetic (add to Medication List) Wound #1 Right Gluteus o Topical Lidocaine 4% cream applied to wound bed prior to debridement (In Clinic Only). Skin Barriers/Peri-Wound Care Wound #1 Right Gluteus o Skin Prep Primary Wound Dressing Wound #1 Right Gluteus o Saline moistened gauze - until wound vac reapplied at facility o Silver Collagen - silver collagen to undermining area above ischial tuberosity under NPWT foam Secondary Dressing Wound #1 Right Gluteus o ABD pad - 3rd layer in clinic only o Dry Gauze - 2nd layer in clinic only o Other - secure with Tegaderm or tape in clinic only Dressing Change Frequency Wound #1 Right Gluteus o Change Dressing Monday, Wednesday,  Friday - NPWT Follow-up Appointments Wound #1 Right Gluteus o Return Appointment in 2 weeks. Off-Loading Wound #1  Right Gluteus o Mattress - Air mattress o Turn and reposition every 2 hours Additional Orders / Instructions Wound #1 Right Gluteus o Increase protein intake. o Other: - Pro-stat for wound healing twice daily. [name], [name] ([id number]) Negative Pressure Wound Therapy Wound #1 Right Gluteus o Wound VAC settings at 125/130 mmHg continuous pressure. Use BLACK/GREEN foam to wound cavity. Use WHITE foam to fill any tunnel/s and/or undermining. Change VAC dressing 3 X WEEK. Change canister as indicated when full. Nurse may titrate settings and frequency of dressing changes as clinically indicated. [facility] to reapply; white foam over bone o Home Health Nurse may d/c VAC for s/s of increased infection, significant wound regression, or uncontrolled drainage. [facility] at [phone]. Electronic Signature(s) Signed: 07/20/2018 4:51:54 PM By: Baltazar Najjar MD Signed: 07/21/2018 5:07:26 PM By: Elliot Gurney, BSN, RN, CWS, Kim RN, BSN Entered By: Elliot Gurney, BSN, RN, CWS, Kim on 07/20/2018 10:23:21 [name], [name] ([id number]) -------------------------------------------------------------------------------- Problem List Details Patient Name: [name], [name] Date of Service: 07/20/2018 10:00 AM Medical Record Number: [id number] Patient Account Number: [id number] Date of Birth/Sex: October 29, 1956 (62 y.o. M) Treating RN: Huel Coventry Primary Care Provider: Sindy Messing Other Clinician: Referring Provider: Sindy Messing Treating Provider/Extender: Altamese [place] in Treatment: 7 Active Problems ICD-10 Evaluated Encounter Code Description Active Date Today Diagnosis L89.314 Pressure ulcer of right buttock, stage 4 06/01/2018 No Yes M86.18 Other acute osteomyelitis, other site 06/01/2018 No Yes T81.31XD Disruption of external operation  (surgical) wound, not 06/01/2018 No Yes elsewhere classified, subsequent encounter E43 Unspecified severe protein-calorie malnutrition 06/01/2018 No Yes Inactive Problems Resolved Problems Electronic Signature(s) Signed: 07/20/2018 4:51:54 PM By: Baltazar Najjar MD Entered By: Baltazar Najjar on 07/20/2018 11:11:08 [name], [name] ([id number]) -------------------------------------------------------------------------------- Progress Note Details Patient Name: [name] Date of Service: 07/20/2018 10:00 AM Medical Record Number: [id number] Patient Account Number: [id number] Date of Birth/Sex: 06/02/1956 (62 y.o. M) Treating RN: Huel Coventry Primary Care Provider: Sindy Messing Other Clinician: Referring Provider: Sindy Messing Treating Provider/Extender: Maxwell Caul [place] in Treatment: 7 Subjective History of Present Illness (HPI) ADMISSION 06/01/18 This is a 62 year old man who is sent to [place] currently residing at [facility] skilled facility. He has a complicated recent medical history. His sister accompanies him tells [place] that he has been somewhat disabled since suffering a left basal ganglial CVA in 2018. At that point in time he also had myocardial infarctions. More recently he has been hospitalized from 04/04/18 through 04/12/18 with septic shock, coronary artery disease, congestive heart failure and an acute CVA. During this hospitalization he was noted to have sacral wounds but these were not felt to be the source of infection however the source of the infection was never really determined at that point. I believe he was sent to a skilled facility. He reports that at the hospital on 05/09/18 through 05/18/18. At that point he was noted to have a large abscess of the right buttock. He required a surgical IandD by Dr. Gerrit Friends of general surgery. Culture of this grew Citrobacter,Eikenella and Bacteroides. He had a CT scan of the pelvis that showed an abnormal soft tissue collection  just caudal to the right ischial tuberosity measuring 5.9 and by 4.7. This also involved the distal right deep muscles. There was some cortical bone loss on the right ischial tuberosity and there was some concern with osteomyelitis. He was felt to require treatment for osteomyelitis based on clinical and radiographic findings. He did not have an MRI.  Past medical history includes; abdominal aortic aneurysm, carotid stenosis, hypertension, osteoarthritis, interstitial lung disease on chronic steroids, rheumatoid arthritis, congestive heart failure/ischemic cardiomyopathy with an ejection fraction of 35-40% Also noteworthy is on 05/09/18 his albumin was 2.2 on admission to hospital. I don't think this is actually been repeated. The patient states his appetite is good and he is taking supplements 100%. 06/15/18-He is seen in follow up vibration for right buttock ulcer; this is stable in appearance with minimal amount of non-viable tissue. 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 We've been using a wound VAC. This is centered over the right ischial tuberosity. He is making generally good progress. He was severely hypoalbuminemic at one time although he states that he eats well and takes his supplements. 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 We have been using silver collagen over the superior aspect of the right ischial tuberosity under wound VAC.he has made some nice improvements. The dimensions of the wound are certainly smaller. He has no exposed bone. We received lab work from [facility]. His prealbumin was 18.6 and albumin at 2.57. This is obviously still low. I am not sure what interventions are in place. I know he is on supplements. I discussed this with the patient. I told him that the albumin represents  already moderate to severe protein malnutrition. This would contribute it to do difficulty healing the wound 07/20/18; I received copies of lab work from [facility] on 07/06/18 documenting a sedimentation rate of 25 which seems to be in line with what I see from previous values however his C-reactive protein was 110.8 versus 84.7 on 06/22/18. I have looked through [facility] health Link I don't see a prior value for either inflammatory measure. he has been treated with [name] which I think he is still on for osteomyelitis of the right ischial tuberosity as well as a soft tissue collection just caudad to the right ischial tuberosity itself. Felt to have a soft tissue abscess and/or stigmata of necrotizing fasciitis. I'm not sure who is following this. His CT scan was on 05/09/18 yet he still remains on Invanz. Looking back through the original discharge summary the [name] was supposed to finish on 07/05/18 [name], [name] ([id number]) Objective Constitutional Sitting or standing Blood Pressure is within target range for patient.. Pulse regular and within target range for patient.[name] Respirations regular, non-labored and within target range.. Temperature is normal and within the target range for the patient.[name] appears in no distress. appears somewhat pale. Vitals Time Taken: 9:50 AM, Height: 73 in, Weight: 206 lbs, BMI: 27.2, Temperature: 98.2 F, Pulse: 95 bpm, Respiratory Rate: 18 breaths/min, Blood Pressure: 109/77 mmHg, Pulse Oximetry: 99 %. Eyes Conjunctivae clear. No discharge. Respiratory Respiratory effort is easy and symmetric bilaterally. Rate is normal at rest and on room air.. Cardiovascular appears well-hydrated. Gastrointestinal (GI) Abdomen is soft and non-distended without masses or tenderness. Bowel sounds active in all quadrants.. Psychiatric No evidence of depression, anxiety, or agitation. Calm, cooperative, and communicative. Appropriate interactions and affect.. General Notes: wound  exam; large wound over the right buttock centered about the right ischial tuberosity. There is less undermining and tunneling over the ischial tuberosity itself. I could not feel any exposed bone in this region. There is a small area in the middle of the wound was surface tear of the probably  has underlying bone. I did not debride this. Integumentary (Hair, Skin) no primary skin issues are seen. Wound #1 status is Open. Original cause of wound was Gradually Appeared. The wound is located on the Right Gluteus. The wound measures 10.1cm length x 7cm width x 3cm depth; 55.528cm^2 area and 166.583cm^3 volume. There is bone, muscle, Fat Layer (Subcutaneous Tissue) Exposed, and fascia exposed. There is undermining starting at 7:00 and ending at 9:00 with a maximum distance of 2cm. There is a large amount of serosanguineous drainage noted. The wound margin is distinct with the outline attached to the wound base. There is large (67-100%) red, pink, hyper - granulation within the wound bed. There is a small (1-33%) amount of necrotic tissue within the wound bed including Adherent Slough. The periwound skin appearance exhibited: Scarring. The periwound skin appearance did not exhibit: Callus, Crepitus, Excoriation, Induration, Rash, Dry/Scaly, Maceration, Atrophie Blanche, Cyanosis, Ecchymosis, Hemosiderin Staining, Mottled, Pallor, Rubor, Erythema. Periwound temperature was noted as No Abnormality. The periwound has tenderness on palpation. Assessment Active Problems ICD-10 Pressure ulcer of right buttock, stage 4 Other acute osteomyelitis, other site Disruption of external operation (surgical) wound, not elsewhere classified, subsequent encounter [name], [name] ([id number]) Unspecified severe protein-calorie malnutrition Plan Wound Cleansing: Wound #1 Right Gluteus: Clean wound with Normal Saline. Cleanse wound with mild soap and water Anesthetic (add to Medication List): Wound #1 Right  Gluteus: Topical Lidocaine 4% cream applied to wound bed prior to debridement (In Clinic Only). Skin Barriers/Peri-Wound Care: Wound #1 Right Gluteus: Skin Prep Primary Wound Dressing: Wound #1 Right Gluteus: Saline moistened gauze - until wound vac reapplied at facility Silver Collagen - silver collagen to undermining area above ischial tuberosity under NPWT foam Secondary Dressing: Wound #1 Right Gluteus: ABD pad - 3rd layer in clinic only Dry Gauze - 2nd layer in clinic only Other - secure with Tegaderm or tape in clinic only Dressing Change Frequency: Wound #1 Right Gluteus: Change Dressing Monday, Wednesday, Friday - NPWT Follow-up Appointments: Wound #1 Right Gluteus: Return Appointment in 2 weeks. Off-Loading: Wound #1 Right Gluteus: Mattress - Air mattress Turn and reposition every 2 hours Additional Orders / Instructions: Wound #1 Right Gluteus: Increase protein intake. Other: - Pro-stat for wound healing twice daily. Negative Pressure Wound Therapy: Wound #1 Right Gluteus: Wound VAC settings at 125/130 mmHg continuous pressure. Use BLACK/GREEN foam to wound cavity. Use WHITE foam to fill any tunnel/s and/or undermining. Change VAC dressing 3 X WEEK. Change canister as indicated when full. Nurse may titrate settings and frequency of dressing changes as clinically indicated. [facility] to reapply; white foam over bone Home Health Nurse may d/c VAC for s/s of increased infection, significant wound regression, or uncontrolled drainage. [facility] at [phone]. #1 I'm continuing the wound VAC with collagen in the area over the ischial tuberosity itself. #2 I researched [place] Link and I wasn't able to determine who is following this underlying osteomyelitis. Although the sedimentation rate is slightly elevated and doesn't look like that is changed all that much since the beginning of August. [place], Nazario W. ([id number]) However he  continues to have a very high C reactive protein. I think we need to consider repeating a white count. Perhaps even reimaging the area in question which would be the right ischial tuberosity. #3 in spite of the concern since dimensions of his wounds are actually better in spite of these concerns and also moderate protein calorie malnutrition Electronic Signature(s) Signed: 07/20/2018 11:33:11 AM  By: Baltazar Najjar MD Entered By: Baltazar Najjar on 07/20/2018 11:33:11 [name], [name] ([id number]) -------------------------------------------------------------------------------- SuperBill Details Patient Name: [name] Date of Service: 07/20/2018 Medical Record Number: [id number] Patient Account Number: [id number] Date of Birth/Sex: 14-Mar-1956 (62 y.o. M) Treating RN: Huel Coventry Primary Care Provider: Sindy Messing Other Clinician: Referring Provider: Sindy Messing Treating Provider/Extender: Altamese [place] in Treatment: 7 Diagnosis Coding ICD-10 Codes Code Description L89.314 Pressure ulcer of right buttock, stage 4 M86.18 Other acute osteomyelitis, other site T81.31XD Disruption of external operation (surgical) wound, not elsewhere classified, subsequent encounter E43 Unspecified severe protein-calorie malnutrition Facility Procedures CPT4 Code: [id number] Description: 99213 - WOUND CARE VISIT-LEV 3 EST PT Modifier: Quantity: 1 Physician Procedures CPT4 Code: [id number] Description: 99213 - WC PHYS LEVEL 3 - EST PT ICD-10 Diagnosis Description L89.314 Pressure ulcer of right buttock, stage 4 M86.18 Other acute osteomyelitis, other site Modifier: Quantity: 1 Electronic Signature(s) Signed: 07/20/2018 4:51:54 PM By: Baltazar Najjar MD Entered By: Baltazar Najjar on 07/20/2018 11:31:10

## 2018-07-23 NOTE — Progress Notes (Signed)
AXTEN, PASCUCCI ([id number]) Visit Report for 07/20/2018 Arrival Information Details Patient Name: [name], [name] Date of Service: 07/20/2018 10:00 AM Medical Record Number: [id number] Patient Account Number: [id number] Date of Birth/Sex: Jul 27, 1956 (62 y.o. M) Treating [name]: [name] Primary Care [name]: [name] Other Clinician: Referring [name]: [name] Treating [name]/Extender: [name] in Treatment: 7 Visit Information History Since Last Visit Added or deleted any medications: No Patient Arrived: Stretcher Any new allergies or adverse reactions: No Arrival Time: 09:55 Had a fall or experienced change in No Accompanied By: EMS activities of daily living that may affect Transfer Assistance: None risk of falls: Patient Identification Verified: Yes Signs or symptoms of abuse/neglect since last visito No Secondary Verification Process Completed: Yes Hospitalized since last visit: No Patient Requires Transmission-Based No Implantable device outside of the clinic excluding No Precautions: cellular tissue based products placed in the center Patient Has Alerts: No since last visit: Has Dressing in Place as Prescribed: Yes Pain Present Now: No Electronic Signature(s) Signed: 07/20/2018 3:44:28 PM By: [name] RCP, RRT, CHT Entered By: [name] on 07/20/2018 09:56:38 [name], [name] ([id number]) -------------------------------------------------------------------------------- Clinic Level of Care Assessment Details Patient Name: [name], [name] Date of Service: 07/20/2018 10:00 AM Medical Record Number: [id number] Patient Account Number: [id number] Date of Birth/Sex: 03/14/1956 (63 y.o. M) Treating [name]: [name] Primary Care [name]: [name] Other Clinician: Referring [name]: [name] Treating [name]/Extender: [name] in Treatment: 7 Clinic Level of Care Assessment  Items TOOL 4 Quantity Score []  - Use when only an EandM is performed on FOLLOW-UP visit 0 ASSESSMENTS - Nursing Assessment / Reassessment []  - Reassessment of Co-morbidities (includes updates in patient status) 0 X- 1 5 Reassessment of Adherence to Treatment Plan ASSESSMENTS - Wound and Skin Assessment / Reassessment X - Simple Wound Assessment / Reassessment - one wound 1 5 []  - 0 Complex Wound Assessment / Reassessment - multiple wounds []  - 0 Dermatologic / Skin Assessment (not related to wound area) ASSESSMENTS - Focused Assessment []  - Circumferential Edema Measurements - multi extremities 0 []  - 0 Nutritional Assessment / Counseling / Intervention []  - 0 Lower Extremity Assessment (monofilament, tuning fork, pulses) []  - 0 Peripheral Arterial Disease Assessment (using hand held doppler) ASSESSMENTS - Ostomy and/or Continence Assessment and Care []  - Incontinence Assessment and Management 0 []  - 0 Ostomy Care Assessment and Management (repouching, etc.) PROCESS - Coordination of Care X - Simple Patient / Family Education for ongoing care 1 15 []  - 0 Complex (extensive) Patient / Family Education for ongoing care X- 1 10 Staff obtains Chiropractor, Records, Test Results / Process Orders []  - 0 Staff telephones HHA, Nursing Homes / Clarify orders / etc []  - 0 Routine Transfer to another Facility (non-emergent condition) []  - 0 Routine Hospital Admission (non-emergent condition) []  - 0 New Admissions / Manufacturing engineer / Ordering NPWT, Apligraf, etc. []  - 0 Emergency Hospital Admission (emergent condition) X- 1 10 Simple Discharge Coordination [name], [name] ([id number]) []  - 0 Complex (extensive) Discharge Coordination PROCESS - Special Needs []  - Pediatric / Minor Patient Management 0 []  - 0 Isolation Patient Management []  - 0 Hearing / Language / Visual special needs []  - 0 Assessment of Community assistance (transportation, D/C planning, etc.) []  -  0 Additional assistance / Altered mentation []  - 0 Support Surface(s) Assessment (bed, cushion, seat, etc.) INTERVENTIONS - Wound Cleansing / Measurement X - Simple Wound Cleansing - one wound 1 5 []  -  0 Complex Wound Cleansing - multiple wounds X- 1 5 Wound Imaging (photographs - any number of wounds) []  - 0 Wound Tracing (instead of photographs) X- 1 5 Simple Wound Measurement - one wound []  - 0 Complex Wound Measurement - multiple wounds INTERVENTIONS - Wound Dressings []  - Small Wound Dressing one or multiple wounds 0 []  - 0 Medium Wound Dressing one or multiple wounds X- 1 20 Large Wound Dressing one or multiple wounds []  - 0 Application of Medications - topical []  - 0 Application of Medications - injection INTERVENTIONS - Miscellaneous []  - External ear exam 0 []  - 0 Specimen Collection (cultures, biopsies, blood, body fluids, etc.) []  - 0 Specimen(s) / Culture(s) sent or taken to Lab for analysis []  - 0 Patient Transfer (multiple staff / Nurse, adult / Similar devices) []  - 0 Simple Staple / Suture removal (25 or less) []  - 0 Complex Staple / Suture removal (26 or more) []  - 0 Hypo / Hyperglycemic Management (close monitor of Blood Glucose) []  - 0 Ankle / Brachial Index (ABI) - do not check if billed separately X- 1 5 Vital Signs [name], [name] ([id number]) Has the patient been seen at the hospital within the last three years: Yes Total Score: 85 Level Of Care: New/Established - Level 3 Electronic Signature(s) Signed: 07/21/2018 5:07:26 PM By: [name], BSN, [name], CWS, [name], BSN Entered By: [name], BSN, [name], CWS, Ryan on 07/20/2018 10:24:11 [name] ([id number]) -------------------------------------------------------------------------------- Encounter Discharge Information Details Patient Name: [name], [name] Date of Service: 07/20/2018 10:00 AM Medical Record Number: [id number] Patient Account Number: [id number] Date of Birth/Sex: Jul 15, 1956 (62 y.o.  M) Treating [name]: [name] Primary Care [name]: [name] Other Clinician: Referring [name]: [name] Treating [name]/Extender: [name] in Treatment: 7 Encounter Discharge Information Items Discharge Condition: Stable Ambulatory Status: Stretcher Discharge Destination: Skilled Nursing Facility Telephoned: No Orders Sent: Yes Transportation: Private Auto Accompanied By: EMS Schedule Follow-up Appointment: Yes Clinical Summary of Care: Electronic Signature(s) Signed: 07/20/2018 10:41:32 AM By: [name] Entered By: [name] on 07/20/2018 10:41:32 [name], [name] ([id number]) -------------------------------------------------------------------------------- Lower Extremity Assessment Details Patient Name: [name] Date of Service: 07/20/2018 10:00 AM Medical Record Number: [id number] Patient Account Number: [id number] Date of Birth/Sex: [birth year] (62 y.o. M) Treating [name]: Rema Jasmine Primary Care Jaiyla Granados: [name] Other Clinician: Referring Jenicka Coxe: [name] Treating Mackinsey Pelland/Extender: Maxwell Caul Weeks in Treatment: 7 Electronic Signature(s) Signed: 07/20/2018 1:04:55 PM By: Rema Jasmine Entered By: Rema Jasmine on 07/20/2018 10:12:43 [name], [name] ([id number]) -------------------------------------------------------------------------------- Multi Wound Chart Details Patient Name: [name] Date of Service: 07/20/2018 10:00 AM Medical Record Number: [id number] Patient Account Number: [id number] Date of Birth/Sex: Jan 05, 1956 (62 y.o. M) Treating [name]: [name] Primary Care Ricco Dershem: [name] Other Clinician: Referring Yamir Carignan: [name] Treating Shatika Grinnell/Extender: Maxwell Caul Weeks in Treatment: 7 Vital Signs Height(in): 73 Pulse(bpm): 95 Weight(lbs): 206 Blood Pressure(mmHg): 109/77 Body Mass Index(BMI): 27 Temperature(F): 98.2 Respiratory Rate 18 (breaths/min): Photos:  [N/A:N/A] Wound Location: Right Gluteus N/A N/A Wounding Event: Gradually Appeared N/A N/A Primary Etiology: Pressure Ulcer N/A N/A Comorbid History: Cataracts, Congestive Heart N/A N/A Failure, Coronary Artery Disease, Hypertension, Myocardial Infarction, History of pressure wounds, Rheumatoid Arthritis Date Acquired: 05/09/2018 N/A N/A Weeks of Treatment: 7 N/A N/A Wound Status: Open N/A N/A Measurements L x W x D 10.1x7x3 N/A N/A (cm) Area (cm) : 55.528 N/A N/A Volume (cm) : 756.433 N/A N/A % Reduction in Area: 54.80% N/A N/A % Reduction in  Volume: 69.20% N/A N/A Starting Position 1 7 (o'clock): Ending Position 1 9 (o'clock): Maximum Distance 1 (cm): 2 Undermining: Yes N/A N/A Classification: Category/Stage IV N/A N/A Exudate Amount: Large N/A N/A Exudate Type: Serosanguineous N/A N/A Exudate Color: red, brown N/A N/A Wound Margin: Distinct, outline attached N/A N/A [name], GIFT. ([id number]) Granulation Amount: Large (67-100%) N/A N/A Granulation Quality: Red, Pink, Hyper-granulation N/A N/A Necrotic Amount: Small (1-33%) N/A N/A Exposed Structures: Fascia: Yes N/A N/A Fat Layer (Subcutaneous Tissue) Exposed: Yes Muscle: Yes Bone: Yes Epithelialization: None N/A N/A Periwound Skin Texture: Scarring: Yes N/A N/A Excoriation: No Induration: No Callus: No Crepitus: No Rash: No Periwound Skin Moisture: Maceration: No N/A N/A Dry/Scaly: No Periwound Skin Color: Atrophie Blanche: No N/A N/A Cyanosis: No Ecchymosis: No Erythema: No Hemosiderin Staining: No Mottled: No Pallor: No Rubor: No Temperature: No Abnormality N/A N/A Tenderness on Palpation: Yes N/A N/A Wound Preparation: Ulcer Cleansing: N/A N/A Rinsed/Irrigated with Saline Topical Anesthetic Applied: Other: lidocaine 4% Treatment Notes Wound #1 (Right Gluteus) 1. Cleansed with: Clean wound with Normal Saline 2. Anesthetic Topical Lidocaine 4% cream to wound bed prior to  debridement 4. Dressing Applied: Saline moistened guaze 5. Secondary Dressing Applied ABD Pad Dry Gauze 7. Secured with Secretary/administrator) Signed: 07/20/2018 4:51:54 PM By: Baltazar Najjar MD Entered By: Baltazar Najjar on 07/20/2018 11:11:25 [name] ([id number]) -------------------------------------------------------------------------------- Multi-Disciplinary Care Plan Details Patient Name: [name], [name] Date of Service: 07/20/2018 10:00 AM Medical Record Number: [id number] Patient Account Number: [id number] Date of Birth/Sex: 05-Nov-1955 (62 y.o. M) Treating [name]: [name] Primary Care Chrisma Hurlock: [name] Other Clinician: Referring Terence Bart: [name] Treating Monte Bronder/Extender: Ryan [place] in Treatment: 7 Active Inactive ` Necrotic Tissue Nursing Diagnoses: Knowledge deficit related to management of necrotic/devitalized tissue Goals: Necrotic/devitalized tissue will be minimized in the wound bed Date Initiated: 06/15/2018 Target Resolution Date: 06/29/2018 Goal Status: Active Interventions: Assess patient pain level pre-, during and post procedure and prior to discharge Treatment Activities: Apply topical anesthetic as ordered : 06/15/2018 Excisional debridement : 06/15/2018 Notes: ` Orientation to the Wound Care Program Nursing Diagnoses: Knowledge deficit related to the wound healing center program Goals: Patient/caregiver will verbalize understanding of the Wound Healing Center Program Date Initiated: 06/15/2018 Target Resolution Date: 07/01/2018 Goal Status: Active Interventions: Provide education on orientation to the wound center Notes: ` Pressure Nursing Diagnoses: Knowledge deficit related to management of pressures ulcers Goals: Patient will remain free from development of additional pressure ulcers [name], [name] ([id number]) Date Initiated: 06/15/2018 Target Resolution Date: 07/01/2018 Goal Status:  Active Interventions: Assess: immobility, friction, shearing, incontinence upon admission and as needed Notes: ` Wound/Skin Impairment Nursing Diagnoses: Impaired tissue integrity Goals: Ulcer/skin breakdown will have a volume reduction of 80% by week 12 Date Initiated: 06/15/2018 Target Resolution Date: 09/15/2018 Goal Status: Active Interventions: Assess ulceration(s) every visit Treatment Activities: Skin care regimen initiated : 06/15/2018 Notes: Electronic Signature(s) Signed: 07/21/2018 5:07:26 PM By: [name], BSN, [name], CWS, [name], BSN Entered By: [name], BSN, [name], CWS, Ryan on 07/20/2018 10:21:47 [name] ([id number]) -------------------------------------------------------------------------------- Pain Assessment Details Patient Name: [name], [name] Date of Service: 07/20/2018 10:00 AM Medical Record Number: [id number] Patient Account Number: [id number] Date of Birth/Sex: 1956/08/04 (62 y.o. M) Treating [name]: [name] Primary Care Vadim Centola: [name] Other Clinician: Referring Eleanor Gatliff: [name] Treating Deklen Popelka/Extender: Ryan [place] in Treatment: 7 Active Problems Location of Pain Severity and Description of Pain Patient Has Paino No Site Locations Pain Management and Medication Current  Pain Management: Electronic Signature(s) Signed: 07/20/2018 3:44:28 PM By: [name] RCP, RRT, CHT Signed: 07/21/2018 5:07:26 PM By: [name], BSN, [name], CWS, [name], BSN Entered By: [name] on 07/20/2018 09:56:47 [name] ([id number]) -------------------------------------------------------------------------------- Patient/Caregiver Education Details Patient Name: [name], [name] Date of Service: 07/20/2018 10:00 AM Medical Record Number: [id number] Patient Account Number: [id number] Date of Birth/Gender: [birth year] (61 y.o. M) Treating [name]: [name] Primary Care Physician: [name] Other  Clinician: Referring Physician: Sindy [name] Treating Physician/Extender: Ryan Mason in Treatment: 7 Education Assessment Education Provided To: Caregiver SNF nurses via written orders Education Topics Provided Wound/Skin Impairment: Handouts: Other: wound care orders Methods: Printed Electronic Signature(s) Signed: 07/20/2018 4:04:54 PM By: [name] Entered By: [name] on 07/20/2018 10:41:59 [name], Jaycub W. ([id number]) -------------------------------------------------------------------------------- Wound Assessment Details Patient Name: [name] Date of Service: 07/20/2018 10:00 AM Medical Record Number: [id number] Patient Account Number: [id number] Date of Birth/Sex: [birth year] (62 y.o. M) Treating [name]: Rema Jasmine Primary Care Wildon Cuevas: [name] Other Clinician: Referring Tyasia Packard: [name] Treating Paco Cislo/Extender: Maxwell Caul Weeks in Treatment: 7 Wound Status Wound Number: 1 Primary Pressure Ulcer Etiology: Wound Location: Right Gluteus Wound Open Wounding Event: Gradually Appeared Status: Date Acquired: 05/09/2018 Comorbid Cataracts, Congestive Heart Failure, Coronary Weeks Of Treatment: 7 History: Artery Disease, Hypertension, Myocardial Clustered Wound: No Infarction, History of pressure wounds, Rheumatoid Arthritis Photos Wound Measurements Length: (cm) 10.1 % Reduct Width: (cm) 7 % Reduct Depth: (cm) 3 Epitheli Area: (cm) 55.528 Undermi Volume: (cm) 166.583 Star Endin Maxim ion in Area: 54.8% ion in Volume: 69.2% alization: None ning: Yes ting Position (o'clock): 7 g Position (o'clock): 9 um Distance: (cm) 2 Wound Description Classification: Category/Stage IV Foul Odo Wound Margin: Distinct, outline attached Slough/F Exudate Amount: Large Exudate Type: Serosanguineous Exudate Color: red, brown r After Cleansing: No ibrino Yes Wound Bed Granulation Amount: Large (67-100%) Exposed  Structure Granulation Quality: Red, Pink, Hyper-granulation Fascia Exposed: Yes Necrotic Amount: Small (1-33%) Fat Layer (Subcutaneous Tissue) Exposed: Yes Necrotic Quality: Adherent Slough Muscle Exposed: Yes Necrosis of Muscle: No Bone Exposed: Yes Dysart, Jourden W. ([id number]) Periwound Skin Texture Texture Color No Abnormalities Noted: No No Abnormalities Noted: No Callus: No Atrophie Blanche: No Crepitus: No Cyanosis: No Excoriation: No Ecchymosis: No Induration: No Erythema: No Rash: No Hemosiderin Staining: No Scarring: Yes Mottled: No Pallor: No Moisture Rubor: No No Abnormalities Noted: No Dry / Scaly: No Temperature / Pain Maceration: No Temperature: No Abnormality Tenderness on Palpation: Yes Wound Preparation Ulcer Cleansing: Rinsed/Irrigated with Saline Topical Anesthetic Applied: Other: lidocaine 4%, Treatment Notes Wound #1 (Right Gluteus) 1. Cleansed with: Clean wound with Normal Saline 2. Anesthetic Topical Lidocaine 4% cream to wound bed prior to debridement 4. Dressing Applied: Saline moistened guaze 5. Secondary Dressing Applied ABD Pad Dry Gauze 7. Secured with Secretary/administrator) Signed: 07/20/2018 1:04:55 PM By: Rema Jasmine Signed: 07/21/2018 5:07:26 PM By: [name], BSN, [name], CWS, [name], BSN Entered By: [name], BSN, [name], CWS, Ryan on 07/20/2018 10:21:40 AHMAAD, NEIDHARDT ([id number]) -------------------------------------------------------------------------------- Vitals Details Patient Name: BOYKIN, BAETZ. Date of Service: 07/20/2018 10:00 AM Medical Record Number: [id number] Patient Account Number: [id number] Date of Birth/Sex: 06-27-1956 (62 y.o. M) Treating [name]: [name] Primary Care Dashanna Kinnamon: [name] Other Clinician: Referring Concha Sudol: [name] Treating Shalaine Payson/Extender: Ryan [place] in Treatment: 7 Vital Signs Time Taken: 09:50 Temperature (F): 98.2 Height (in): 73 Pulse (bpm): 95 Weight  (lbs): 206 Respiratory Rate (breaths/min): 18 Body Mass Index (BMI): 27.2  Blood Pressure (mmHg): 109/77 Reference Range: 80 - 120 mg / dl Pulse Oximetry (%): 99 Electronic Signature(s) Signed: 07/20/2018 3:44:28 PM By: [name] RCP, RRT, CHT Entered By: Weyman Rodney, Lucio Edward on 07/20/2018 09:58:26

## 2018-07-28 ENCOUNTER — Inpatient Hospital Stay: Payer: Medicaid Other | Admitting: Internal Medicine

## 2018-08-03 ENCOUNTER — Encounter: Payer: Medicaid Other | Attending: Internal Medicine | Admitting: Internal Medicine

## 2018-08-03 DIAGNOSIS — L89314 Pressure ulcer of right buttock, stage 4: Secondary | ICD-10-CM | POA: Diagnosis present

## 2018-08-04 ENCOUNTER — Encounter: Payer: Self-pay | Admitting: Internal Medicine

## 2018-08-04 ENCOUNTER — Ambulatory Visit (INDEPENDENT_AMBULATORY_CARE_PROVIDER_SITE_OTHER): Payer: Medicaid Other | Admitting: Internal Medicine

## 2018-08-04 DIAGNOSIS — M86151 Other acute osteomyelitis, right femur: Secondary | ICD-10-CM | POA: Diagnosis not present

## 2018-08-04 NOTE — Progress Notes (Signed)
[facility]  Patient Active Problem List   Diagnosis Date Noted  . Acute osteomyelitis of pelvic region and thigh, right (HCC) 08/04/2018    Priority: High  . Abscess of buttock 05/11/2018    Priority: High  . Pressure injury of skin of sacral region     Priority: High  . Pressure injury of skin 06/06/2018  . Complicated UTI (urinary tract infection)   . Cholecystitis 06/04/2018  . Severe sepsis (HCC)   . NSVT (nonsustained ventricular tachycardia) (HCC) 05/14/2018  . Adrenal insufficiency (HCC): Relative 05/11/2018  . AKI (acute kidney injury) (HCC)   . Urinary retention   . Rheumatoid arthritis (HCC)   . Chronic obstructive pulmonary disease (HCC)   . Supplemental oxygen dependent   . Tobacco abuse   . AAA (abdominal aortic aneurysm) without rupture (HCC)   . History of CVA with residual deficit   . Acute blood loss anemia   . Hyperkalemia   . Cerebral thrombosis with cerebral infarction 04/11/2018  . Sacral ulcer, with unspecified severity (HCC) 04/05/2018  . Severe comorbid illness   . Acute renal failure (HCC)   . Shock (HCC) 04/04/2018  . High anion gap metabolic acidosis 04/04/2018  . Acute kidney injury (HCC) 04/04/2018  . Cervicalgia 01/18/2018  . Chronic pain of both shoulders 01/18/2018  . Hx of rheumatoid arthritis 01/18/2018  . Chronic pain syndrome 01/18/2018  . Leukocytosis 12/15/2017  . Cough   . Pneumonia 11/01/2017  . Elevated lipase   . Elevated liver enzymes   . Acute pancreatitis 08/08/2017  . Carotid artery disease (HCC) 05/12/2017  . 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 Chronic combined systolic and diastolic CHF (congestive heart failure) (HCC) 03/07/2017  . S/P CABG (coronary artery bypass graft)   . Pure hypercholesterolemia   . Acute combined systolic and diastolic heart failure (HCC)   . Exertional shortness of breath   . Solitary pulmonary nodule   . CAD (coronary  artery disease) 02/24/2017  . ILD (interstitial lung disease) (HCC) 02/24/2017  . Anemia 02/24/2017  . AAA (abdominal aortic aneurysm) (HCC) 02/24/2017  . Abnormal EKG 02/24/2017  . Acute respiratory failure with hypoxia (HCC) 02/24/2017  . History of stroke 02/24/2017  . HTN (hypertension) 02/24/2017  . Current smoker 02/24/2017    Patient's Medications  New Prescriptions   No medications on file  Previous Medications   ACETAMINOPHEN (TYLENOL) 500 MG TABLET    Take 1,000 mg by mouth every 6 (six) hours as needed for mild pain.    ALBUTEROL (PROVENTIL) (2.5 MG/3ML) 0.083% NEBULIZER SOLUTION    Take 2.5 mg by nebulization every 6 (six) hours as needed for wheezing or shortness of breath.   AMINO ACIDS-PROTEIN HYDROLYS (FEEDING SUPPLEMENT, PRO-STAT SUGAR FREE 64,) LIQD    Take 30 mLs by mouth 2 (two) times daily.   ATORVASTATIN (LIPITOR) 40 MG TABLET    Take 1 tablet (40 mg total) by mouth daily at 6 PM.   CARVEDILOL (COREG) 6.25 MG TABLET    Take 1 tablet (6.25 mg total) by mouth 2 (two) times daily with a meal.   ENOXAPARIN (LOVENOX) 40 MG/0.4ML INJECTION    Inject 0.4 mLs (40 mg total) into the skin daily.   GABAPENTIN (NEURONTIN) 300 MG CAPSULE    Take 1 capsule (300 mg total) by mouth every 8 (eight) hours.   HYDROCODONE-ACETAMINOPHEN (NORCO) 10-325 MG TABLET    Take 1 tablet by mouth  3 (three) times daily.   HYDROXYCHLOROQUINE (PLAQUENIL) 200 MG TABLET    Take 1 tablet (200 mg total) by mouth 2 (two) times daily.   IPRATROPIUM-ALBUTEROL (DUONEB) 0.5-2.5 (3) MG/3ML SOLN    Take 3 mLs by nebulization 2 (two) times daily.   MAGNESIUM HYDROXIDE (MILK OF MAGNESIA) 400 MG/5ML SUSPENSION    Take 30 mLs by mouth daily as needed for mild constipation. And QHS   MENTHOL-METHYL SALICYLATE (MUSCLE RUB EX)    Apply 1 application topically as needed (for shoulder or knee pain).    MIRTAZAPINE (REMERON) 7.5 MG TABLET    Take 7.5 mg by mouth at bedtime.   MORPHINE (MSIR) 30 MG TABLET    Take 1 tablet  (30 mg total) by mouth every 4 (four) hours as needed (breakthrough pain).   MULTIPLE VITAMIN (MULTIVITAMIN WITH MINERALS) TABS TABLET    Take 1 tablet by mouth daily.   NICOTINE (NICODERM CQ - DOSED IN MG/24 HR) 7 MG/24HR PATCH    Place 1 patch (7 mg total) onto the skin daily.   NUTRITION SUPPLEMENT, JUVEN, (JUVEN) PACK    Take 1 packet by mouth 2 (two) times daily between meals.   NYSTATIN CREAM (MYCOSTATIN)    Apply topically 2 (two) times daily.   ONDANSETRON (ZOFRAN) 4 MG TABLET    Take 1 tablet (4 mg total) by mouth every 6 (six) hours as needed for nausea.   POLYETHYLENE GLYCOL (MIRALAX / GLYCOLAX) PACKET    Take 17 g by mouth 2 (two) times daily. Until having 1 soft BM daily, then can decrease to daily or as needed   POTASSIUM CHLORIDE SA (K-DUR,KLOR-CON) 20 MEQ TABLET    Take 2 tablets (40 mEq total) by mouth daily.   PREDNISONE (DELTASONE) 10 MG TABLET    Take 10 mg by mouth daily with breakfast.   PROVENTIL HFA 108 (90 BASE) MCG/ACT INHALER    INHALE 2 PUFFS EVERY 6 HOURS AS NEEDED FOR WHEEZE/SHORTNESS OF BREATH   SACCHAROMYCES BOULARDII (FLORASTOR) 250 MG CAPSULE    Take 1 capsule (250 mg total) by mouth 2 (two) times daily.   TAMSULOSIN (FLOMAX) 0.4 MG CAPS CAPSULE    Take 1 capsule (0.4 mg total) by mouth daily after supper.  Modified Medications   No medications on file  Discontinued Medications   No medications on file    Subjective: Mr. Malsom is in for his hospital follow-up visit.  He has a history of COPD, interstitial lung disease, coronary artery disease and recently had a CVA.  He developed a sacral decubitus ulcer that was complicated by a right buttock abscess and right ischial tuberosity osteomyelitis.  He underwent debridement on 05/09/2018.  Cultures grew Citrobacter, viridans strep, Bacteroides and Eikenella.  He was treated with broad empiric antibiotic then switched to IV ertapenem with a plan to complete 6 weeks of therapy on 06/19/2018.  However, he was readmitted on  06/04/2018 with dysuria.  A CT scan showed a question of cholecystitis and adjacent small abscess but he was seen by general surgery.  They felt that he was completely asymptomatic and this was an inconsequential finding.  He was noted to have a right thigh abscess which was smaller.  However he was having increased drainage from his sacral decubitus so his ertapenem was extended until 07/15/2018.  He states that he is feeling better.  He denies any fever, chills or sweats.  He says that he has been told that his wound is improving.  His wound  VAC was removed recently because he developed a rash around his wound.  Review of Systems: Review of Systems  Constitutional: Negative for chills, diaphoresis and fever.  Gastrointestinal: Negative for abdominal pain, diarrhea, nausea and vomiting.  Skin: Positive for rash.    Past Medical History:  Diagnosis Date  . AAA (abdominal aortic aneurysm) (HCC)   . AKI (acute kidney injury) (HCC)    a. Cr up to 8 in 04/2018.  . Arthritis   . CAD in native artery    a. h/o MI x 3 s/p CABG x 4 (1993 with Tyrone Sage). b. LHC 01/2017 with severe native disease, 2 grafts occluded.  . Carotid artery disease (HCC)    a. s/p R CEA 11/2016.  [name] Chronic combined systolic and diastolic CHF (congestive heart failure) (HCC)   . GERD (gastroesophageal reflux disease)   . Hyperkalemia   . ILD (interstitial lung disease) (HCC)   . Lung nodule   . MI (myocardial infarction) (HCC)    x 3   . Normocytic anemia   . NSVT (nonsustained ventricular tachycardia) (HCC)   . Pancreatitis    1/19  . Pneumonia 11/2017  . RA (rheumatoid arthritis) (HCC)   . Stroke (HCC)    a. 2018. b. possible recurrence in 04/2018, seen by neuro  . Tobacco abuse     Social History   Tobacco Use  . Smoking status: Current Every Day Smoker    Packs/day: 0.50    Years: 47.00    Pack years: 23.50    Types: Cigarettes  . Smokeless tobacco: Never Used  . Tobacco comment: half pack every other day    Substance Use Topics  . Alcohol use: No    Comment: remote history of beer drinking  . Drug use: No    Family History  Problem Relation Age of Onset  . Heart murmur Mother   . Heart disease Mother   . Heart attack Mother   . Suicidality Father   . Heart attack Father   . Stroke Brother   . Heart attack Brother   . Heart attack Maternal Grandmother   . Cancer Maternal Grandmother        unknown type  . Lung cancer Maternal Aunt   . Lung cancer Maternal Uncle   . Cancer Maternal Aunt        unknown cancer  . Heart attack Brother     No Known Allergies  Objective: Vitals:   08/04/18 1015  BP: 120/79  Pulse: 94  Temp: 98.3 F (36.8 C)  Weight: 161 lb (73 kg)   Body mass index is 21.24 kg/m.  Physical Exam  Constitutional: He is oriented to person, place, and time.  He is resting quietly on a stretcher.  Abdominal:  The sacral decubitus wound is clean and granulating well.  Yellow drainage on his gauze dressing but this may be contamination from urine.  There is no exposed bone.  He has some red maculopapular rash around the wound.  Neurological: He is alert and oriented to person, place, and time.  Skin:  He still has his right arm PICC.  Psychiatric: He has a normal mood and affect.      Lab Results    Problem List Items Addressed This Visit      High   Acute osteomyelitis of pelvic region and thigh, right (HCC)       Cliffton Asters, MD [facility] [phone] pager   [phone]  cell 08/04/2018, 10:47 AM

## 2018-08-04 NOTE — Assessment & Plan Note (Signed)
I suspect that his abscess and osteomyelitis have been cured following 2-1/2 months of IV ertapenem.  I will have his PICC removed.  He can continue follow-up under Dr. Jannetta Quint care at the wound center.

## 2018-08-06 NOTE — Progress Notes (Signed)
[name], [name] ([id number]) Visit Report for 08/03/2018 Arrival Information Details Patient Name: [name], [name] Date of Service: 08/03/2018 1:30 PM Medical Record Number: [id number] Patient Account Number: [id number] Date of Birth/Sex: [birth year] (62 y.o. M) Treating RN: [name] Primary Care [name]: [name] Other Clinician: Referring [name]: [name] Treating [name]/Extender: [name] in Treatment: 9 Visit Information History Since Last Visit Added or deleted any medications: No Patient Arrived: Stretcher Any new allergies or adverse reactions: No Arrival Time: 13:42 Had a fall or experienced change in No Accompanied By: self activities of daily living that may affect Transfer Assistance: Stretcher risk of falls: Patient Requires Transmission-Based No Signs or symptoms of abuse/neglect since last visito No Precautions: Hospitalized since last visit: No Patient Has Alerts: No Implantable device outside of the clinic excluding No cellular tissue based products placed in the center since last visit: Has Dressing in Place as Prescribed: Yes Pain Present Now: Yes Electronic Signature(s) Signed: 08/03/2018 2:44:24 PM By: [name] RCP, RRT, CHT Entered By: [name] on 08/03/2018 13:42:46 [name], [name] ([id number]) -------------------------------------------------------------------------------- Clinic Level of Care Assessment Details Patient Name: [name] Date of Service: 08/03/2018 1:30 PM Medical Record Number: [id number] Patient Account Number: [id number] Date of Birth/Sex: January 01, 1956 (62 y.o. M) Treating RN: [name] Primary Care Xane Amsden: [name] Other Clinician: Referring [name]: [name] Treating Yaiden Yang/Extender: Ryan  in Treatment: 9 Clinic Level of Care Assessment Items TOOL 4 Quantity Score []  - Use when only an EandM is performed on FOLLOW-UP visit  0 ASSESSMENTS - Nursing Assessment / Reassessment []  - Reassessment of Co-morbidities (includes updates in patient status) 0 X- 1 5 Reassessment of Adherence to Treatment Plan ASSESSMENTS - Wound and Skin Assessment / Reassessment X - Simple Wound Assessment / Reassessment - one wound 1 5 []  - 0 Complex Wound Assessment / Reassessment - multiple wounds []  - 0 Dermatologic / Skin Assessment (not related to wound area) ASSESSMENTS - Focused Assessment []  - Circumferential Edema Measurements - multi extremities 0 []  - 0 Nutritional Assessment / Counseling / Intervention []  - 0 Lower Extremity Assessment (monofilament, tuning fork, pulses) []  - 0 Peripheral Arterial Disease Assessment (using hand held doppler) ASSESSMENTS - Ostomy and/or Continence Assessment and Care []  - Incontinence Assessment and Management 0 []  - 0 Ostomy Care Assessment and Management (repouching, etc.) PROCESS - Coordination of Care X - Simple Patient / Family Education for ongoing care 1 15 []  - 0 Complex (extensive) Patient / Family Education for ongoing care X- 1 10 Staff obtains Chiropractor, Records, Test Results / Process Orders []  - 0 Staff telephones HHA, Nursing Homes / Clarify orders / etc []  - 0 Routine Transfer to another Facility (non-emergent condition) []  - 0 Routine Hospital Admission (non-emergent condition) []  - 0 New Admissions / Manufacturing engineer / Ordering NPWT, Apligraf, etc. []  - 0 Emergency Hospital Admission (emergent condition) X- 1 10 Simple Discharge Coordination [name], [name] ([id number]) []  - 0 Complex (extensive) Discharge Coordination PROCESS - Special Needs []  - Pediatric / Minor Patient Management 0 []  - 0 Isolation Patient Management []  - 0 Hearing / Language / Visual special needs []  - 0 Assessment of Community assistance (transportation, D/C planning, etc.) []  - 0 Additional assistance / Altered mentation []  - 0 Support Surface(s) Assessment (bed,  cushion, seat, etc.) INTERVENTIONS - Wound Cleansing / Measurement X - Simple Wound Cleansing - one wound 1 5 []  - 0 Complex Wound Cleansing - multiple wounds X-  1 5 Wound Imaging (photographs - any number of wounds) []  - 0 Wound Tracing (instead of photographs) X- 1 5 Simple Wound Measurement - one wound []  - 0 Complex Wound Measurement - multiple wounds INTERVENTIONS - Wound Dressings []  - Small Wound Dressing one or multiple wounds 0 []  - 0 Medium Wound Dressing one or multiple wounds X- 1 20 Large Wound Dressing one or multiple wounds []  - 0 Application of Medications - topical []  - 0 Application of Medications - injection INTERVENTIONS - Miscellaneous []  - External ear exam 0 []  - 0 Specimen Collection (cultures, biopsies, blood, body fluids, etc.) []  - 0 Specimen(s) / Culture(s) sent or taken to Lab for analysis X- 1 10 Patient Transfer (multiple staff / / Similar devices) []  - 0 Simple Staple / Suture removal (25 or less) []  - 0 Complex Staple / Suture removal (26 or more) []  - 0 Hypo / Hyperglycemic Management (close monitor of Blood Glucose) []  - 0 Ankle / Brachial Index (ABI) - do not check if billed separately X- 1 5 Vital Signs Lites, Devinn W. ( ) Has the patient been seen at the hospital within the last three years: Yes Total Score: 95 Level Of Care: New/Established - Level 3 Electronic Signature(s) Signed: 08/04/2018 3:05:38 PM By: , BSN, RN, CWS, Kim RN, BSN Entered By: , BSN, RN, CWS, Kim on 08/03/2018 14:06:11 ( ) -------------------------------------------------------------------------------- Lower Extremity Assessment Details Patient Name: [name] Date of Service: 08/03/2018 1:30 PM Medical Record Number: Patient Account Number: Date of Birth/Sex: 1956-06-09 (62 y.o. M) Treating RN: 10/04/2018 Primary Care [name]: [name] Other Clinician: Referring  [name]: [name] Treating [name]/Extender: 10/03/2018 Weeks in Treatment: 9 Electronic Signature(s) Signed: 08/03/2018 4:28:11 PM By: [id number] Entered By: [place] on 08/03/2018 13:55:24 Reihl, [name] (10/03/2018) -------------------------------------------------------------------------------- Multi Wound Chart Details Patient Name: [id number]. Date of Service: 08/03/2018 1:30 PM Medical Record Number: 10/20/1956 Patient Account Number: ([id number] Date of Birth/Sex: 07-25-1956 (62 y.o. M) Treating RN: [name] Primary Care Analeah Brame: Maxwell Caul Other Clinician: Referring Kristyanna Barcelo: 10/03/2018 Treating Kariana Wiles/Extender: Rema Jasmine in Treatment: 9 Vital Signs Height(in): 73 Pulse(bpm): 82 Weight(lbs): 206 Blood Pressure(mmHg): 99/66 Body Mass Index(BMI): 27 Temperature(F): 98.0 Respiratory Rate 18 (breaths/min): Photos: [N/A:N/A] Wound Location: Right Gluteus N/A N/A Wounding Event: Gradually Appeared N/A N/A Primary Etiology: Pressure Ulcer N/A N/A Comorbid History: Cataracts, Congestive Heart N/A N/A Failure, Coronary Artery Disease, Hypertension, Myocardial Infarction, History of pressure wounds, Rheumatoid Arthritis Date Acquired: 05/09/2018 N/A N/A Weeks of Treatment: 9 N/A N/A Wound Status: Open N/A N/A Measurements L x W x D 10x6x3 N/A N/A (cm) Area (cm) : 47.124 N/A N/A Volume (cm) : 141.372 N/A N/A % Reduction in Area: 61.60% N/A N/A % Reduction in Volume: 73.80% N/A N/A Classification: Category/Stage IV N/A N/A Exudate Amount: Large N/A N/A Exudate Type: Serosanguineous N/A N/A Exudate Color: red, brown N/A N/A Wound Margin: Distinct, outline attached N/A N/A Granulation Amount: Large (67-100%) N/A N/A Granulation Quality: Red, Pink, Hyper-granulation N/A N/A Necrotic Amount: Small (1-33%) N/A N/A Exposed Structures: Fascia: Yes N/A N/A Fat Layer (Subcutaneous Tissue) Exposed: Yes Mabile, [name]  (10/03/2018) Muscle: Yes Bone: Yes Epithelialization: None N/A N/A Periwound Skin Texture: Scarring: Yes N/A N/A Excoriation: No Induration: No Callus: No Crepitus: No Rash: No Periwound Skin Moisture: Maceration: No N/A N/A Dry/Scaly: No Periwound Skin Color: Atrophie Blanche: No N/A N/A Cyanosis: No Ecchymosis: No Erythema: No Hemosiderin Staining:  No Mottled: No Pallor: No Rubor: No Temperature: No Abnormality N/A N/A Tenderness on Palpation: Yes N/A N/A Wound Preparation: Ulcer Cleansing: N/A N/A Rinsed/Irrigated with Saline Topical Anesthetic Applied: Other: lidocaine 4% Treatment Notes Electronic Signature(s) Signed: 08/04/2018 5:56:52 AM By: Baltazar Najjar MD Entered By: Baltazar Najjar on 08/03/2018 16:04:48 [name] ([id number]) -------------------------------------------------------------------------------- Multi-Disciplinary Care Plan Details Patient Name: JEANCARLOS, MARCHENA. Date of Service: 08/03/2018 1:30 PM Medical Record Number: [id number] Patient Account Number: [id number] Date of Birth/Sex: [birth year] (62 y.o. M) Treating RN: [name] Primary Care Taneal Sonntag: [name] Other Clinician: Referring Izumi Mixon: [name] Treating Amillya Chavira/Extender: Ryan [place] in Treatment: 9 Active Inactive ` Necrotic Tissue Nursing Diagnoses: Knowledge deficit related to management of necrotic/devitalized tissue Goals: Necrotic/devitalized tissue will be minimized in the wound bed Date Initiated: 06/15/2018 Target Resolution Date: 06/29/2018 Goal Status: Active Interventions: Assess patient pain level pre-, during and post procedure and prior to discharge Treatment Activities: Apply topical anesthetic as ordered : 06/15/2018 Excisional debridement : 06/15/2018 Notes: ` Orientation to the Wound Care Program Nursing Diagnoses: Knowledge deficit related to the wound healing center program Goals: Patient/caregiver will verbalize  understanding of the Wound Healing Center Program Date Initiated: 06/15/2018 Target Resolution Date: 07/01/2018 Goal Status: Active Interventions: Provide education on orientation to the wound center Notes: ` Pressure Nursing Diagnoses: Knowledge deficit related to management of pressures ulcers Goals: Patient will remain free from development of additional pressure ulcers NAZIAH, WECKERLY ([id number]) Date Initiated: 06/15/2018 Target Resolution Date: 07/01/2018 Goal Status: Active Interventions: Assess: immobility, friction, shearing, incontinence upon admission and as needed Notes: ` Wound/Skin Impairment Nursing Diagnoses: Impaired tissue integrity Goals: Ulcer/skin breakdown will have a volume reduction of 80% by week 12 Date Initiated: 06/15/2018 Target Resolution Date: 09/15/2018 Goal Status: Active Interventions: Assess ulceration(s) every visit Treatment Activities: Skin care regimen initiated : 06/15/2018 Notes: Electronic Signature(s) Signed: 08/04/2018 3:05:38 PM By: [name], BSN, RN, CWS, Kim RN, BSN Entered By: [name], BSN, RN, CWS, Kim on 08/03/2018 14:04:32 Ing, [name] ([id number]) -------------------------------------------------------------------------------- Pain Assessment Details Patient Name: GRAM, SIEDLECKI. Date of Service: 08/03/2018 1:30 PM Medical Record Number: [id number] Patient Account Number: [id number] Date of Birth/Sex: Jan 29, 1956 (62 y.o. M) Treating RN: [name] Primary Care Lety Cullens: [name] Other Clinician: Referring Elio Haden: [name] Treating Vasilia Dise/Extender: Ryan [place] in Treatment: 9 Active Problems Location of Pain Severity and Description of Pain Patient Has Paino Yes Site Locations Rate the pain. Current Pain Level: 8 Pain Management and Medication Current Pain Management: Electronic Signature(s) Signed: 08/03/2018 2:44:24 PM By: [name] RCP, RRT, CHT Signed: 08/04/2018  3:05:38 PM By: [name], BSN, RN, CWS, Kim RN, BSN Entered By: [name] on 08/03/2018 13:43:03 Leggitt, [name] ([id number]) -------------------------------------------------------------------------------- Patient/Caregiver Education Details Patient Name: UNIQUE, SEARFOSS. Date of Service: 08/03/2018 1:30 PM Medical Record Number: [id number] Patient Account Number: [id number] Date of Birth/Gender: [birth year] (62 y.o. M) Treating RN: [name] Primary Care Physician: [name] Other Clinician: Referring Physician: Sindy [name] Treating Physician/Extender: Ryan [place] in Treatment: 9 Education Assessment Education Provided To: Patient Education Topics Provided Pressure: Handouts: Pressure Ulcers: Care and Offloading Methods: Demonstration, Explain/Verbal Responses: State content correctly Wound/Skin Impairment: Handouts: Caring for Your Ulcer, Other: continue wound care as prescribed Methods: Demonstration, Explain/Verbal Responses: State content correctly Electronic Signature(s) Signed: 08/04/2018 3:05:38 PM By: [name], BSN, RN, CWS, Kim RN, BSN Entered By: [name], BSN, RN, CWS, Kim on 08/03/2018 14:06:46 Swavely, [name] ([id number]) -------------------------------------------------------------------------------- Wound Assessment Details Patient Name: [place],  Dyrell W. Date of Service: 08/03/2018 1:30 PM Medical Record Number: [id number] Patient Account Number: [id number] Date of Birth/Sex: December 11, 1955 (62 y.o. M) Treating RN: Rema Jasmine Primary Care Edel Rivero: [name] Other Clinician: Referring Skya Mccullum: [name] Treating Hend Mccarrell/Extender: Ryan [place] in Treatment: 9 Wound Status Wound Number: 1 Primary Pressure Ulcer Etiology: Wound Location: Right Gluteus Wound Open Wounding Event: Gradually Appeared Status: Date Acquired: 05/09/2018 Comorbid Cataracts, Congestive Heart Failure, Coronary Weeks Of Treatment: 9 History:  Artery Disease, Hypertension, Myocardial Clustered Wound: No Infarction, History of pressure wounds, Rheumatoid Arthritis Photos Photo Uploaded By: Rema Jasmine on 08/03/2018 14:02:30 Wound Measurements Length: (cm) 10 Width: (cm) 6 Depth: (cm) 3 Area: (cm) 47.124 Volume: (cm) 141.372 % Reduction in Area: 61.6% % Reduction in Volume: 73.8% Epithelialization: None Tunneling: No Undermining: No Wound Description Classification: Category/Stage IV Foul Odo Wound Margin: Distinct, outline attached Slough/F Exudate Amount: Large Exudate Type: Serosanguineous Exudate Color: red, brown r After Cleansing: No ibrino Yes Wound Bed Granulation Amount: Large (67-100%) Exposed Structure Granulation Quality: Red, Pink, Hyper-granulation Fascia Exposed: Yes Necrotic Amount: Small (1-33%) Fat Layer (Subcutaneous Tissue) Exposed: Yes Necrotic Quality: Adherent Slough Muscle Exposed: Yes Necrosis of Muscle: No Bone Exposed: Yes Periwound Skin Texture Bartles, Seymore W. ([id number]) Texture Color No Abnormalities Noted: No No Abnormalities Noted: No Callus: No Atrophie Blanche: No Crepitus: No Cyanosis: No Excoriation: No Ecchymosis: No Induration: No Erythema: No Rash: No Hemosiderin Staining: No Scarring: Yes Mottled: No Pallor: No Moisture Rubor: No No Abnormalities Noted: No Dry / Scaly: No Temperature / Pain Maceration: No Temperature: No Abnormality Tenderness on Palpation: Yes Wound Preparation Ulcer Cleansing: Rinsed/Irrigated with Saline Topical Anesthetic Applied: Other: lidocaine 4%, Electronic Signature(s) Signed: 08/03/2018 4:28:11 PM By: Rema Jasmine Entered By: Rema Jasmine on 08/03/2018 13:55:14 Sarli, Taji W. ([id number]) -------------------------------------------------------------------------------- Vitals Details Patient Name: ASA, [name] Date of Service: 08/03/2018 1:30 PM Medical Record Number: [id number] Patient Account Number: [id number] Date  of Birth/Sex: [birth year] (62 y.o. M) Treating RN: [name] Primary Care Halee Glynn: [name] Other Clinician: Referring Valoria Tamburri: [name] Treating Janat Tabbert/Extender: Ryan [place] in Treatment: 9 Vital Signs Time Taken: 13:40 Temperature (F): 98.0 Height (in): 73 Pulse (bpm): 82 Weight (lbs): 206 Respiratory Rate (breaths/min): 18 Body Mass Index (BMI): 27.2 Blood Pressure (mmHg): 99/66 Reference Range: 80 - 120 mg / dl Electronic Signature(s) Signed: 08/03/2018 2:44:24 PM By: [name] RCP, RRT, CHT Entered By: [name] on 08/03/2018 13:46:34

## 2018-08-06 NOTE — Progress Notes (Signed)
ESSEX, HABA ([id number]) Visit Report for 08/03/2018 HPI Details Patient Name: [name], [name] Date of Service: 08/03/2018 1:30 PM Medical Record Number: [id number] Patient Account Number: [id number] Date of Birth/Sex: 11-02-1956 (62 y.o. M) Treating RN: Huel Coventry Primary Care Provider: Sindy Messing Other Clinician: Referring Provider: Sindy Messing Treating Provider/Extender: Altamese [place] in Treatment: 9 History of Present Illness HPI Description: ADMISSION 06/01/18 This is a 62 year old man who is sent to [place] currently residing at [facility] skilled facility. He has a complicated recent medical history. His sister accompanies him tells [place] that he has been somewhat disabled since suffering a left basal ganglial CVA in 2018. At that point in time he also had myocardial infarctions. More recently he has been hospitalized from 04/04/18 through 04/12/18 with septic shock, coronary artery disease, congestive heart failure and an acute CVA. During this hospitalization he was noted to have sacral wounds but these were not felt to be the source of infection however the source of the infection was never really determined at that point. I believe he was sent to a skilled facility. He reports that at the hospital on 05/09/18 through 05/18/18. At that point he was noted to have a large abscess of the right buttock. He required a surgical IandD by Dr. Gerrit Friends of general surgery. Culture of this grew Citrobacter,Eikenella and Bacteroides. He had a CT scan of the pelvis that showed an abnormal soft tissue collection just caudal to the right ischial tuberosity measuring 5.9 and by 4.7. This also involved the distal right deep muscles. There was some cortical bone loss on the right ischial tuberosity and there was some concern with osteomyelitis. He was felt to require treatment for osteomyelitis based on clinical and radiographic findings. He did not have an MRI. Past medical history includes;  abdominal aortic aneurysm, carotid stenosis, hypertension, osteoarthritis, interstitial lung disease on chronic steroids, rheumatoid arthritis, congestive heart failure/ischemic cardiomyopathy with an ejection fraction of 35-40% Also noteworthy is on 05/09/18 his albumin was 2.2 on admission to hospital. I don't think this is actually been repeated. The patient states his appetite is good and he is taking supplements 100%. 06/15/18-He is seen in follow up vibration for right buttock ulcer; this is stable in appearance with minimal amount of non-viable tissue. 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 We've been using a wound VAC. This is centered over the right ischial tuberosity. He is making generally good progress. He was severely hypoalbuminemic at one time although he states that he eats well and takes his supplements. 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 We have been using silver collagen over the superior aspect of the right ischial tuberosity under wound VAC.he has made some nice improvements. The dimensions of the wound are certainly smaller. He has no exposed bone. We received lab work from [facility]. His prealbumin was 18.6 and albumin at 2.57. This is obviously still low. I am not sure what interventions are in place. I know he is on supplements. I discussed this with the patient. I told him that the albumin represents already moderate to severe protein malnutrition. This would contribute it to do difficulty healing the wound 07/20/18; I received copies of lab work from [facility] on 07/06/18 documenting a sedimentation rate of 25 which seems to be in line with what I see  from previous values however his C-reactive protein was 110.8 versus 84.7 on 06/22/18. I have looked through [facility] health Link I  don't see a prior value for either inflammatory measure. he has been treated with [name] which I think he is still on for osteomyelitis of the right ischial tuberosity as well as a soft tissue collection just caudad to the right ischial tuberosity itself. Felt to have a soft tissue abscess and/or stigmata of necrotizing fasciitis. I'm not sure who is following this. His CT scan was on 05/09/18 yet he still remains on Invanz. Looking back through the original discharge summary the [name] was supposed to finish on 07/05/18. 08/03/18 the patient continues to make good improvement. The area over the right ischial tuberosity has filled in nicely. His developed some what looks to be candidal skin infection in the periwound. I think they've taken the wound VAC off and are applying nystatin. NICHOLOS, HUAMAN ([id number]) Electronic Signature(s) Signed: 08/04/2018 5:56:52 AM By: Baltazar Najjar MD Entered By: Baltazar Najjar on 08/03/2018 16:06:46 Mccarroll, [name] ([id number]) -------------------------------------------------------------------------------- Physical Exam Details Patient Name: [name] Date of Service: 08/03/2018 1:30 PM Medical Record Number: [id number] Patient Account Number: [id number] Date of Birth/Sex: [birth year] (62 y.o. M) Treating RN: Huel Coventry Primary Care Provider: Sindy Messing Other Clinician: Referring Provider: Sindy Messing Treating Provider/Extender: Maxwell Caul [place] in Treatment: 9 Constitutional Patient is hypotensive.. Pulse regular and within target range for patient.[name] Respirations regular, non-labored and within target range.. Temperature is normal and within the target range for the patient.. Patient looks pale but in no acute distress. Eyes Conjunctivae clear. No discharge. Respiratory Respiratory effort is easy and symmetric bilaterally. Rate is normal at rest and on room air.. Cardiovascular Heart rhythm and rate regular, without murmur or  gallop.. Gastrointestinal (GI) Nontender no distention. Integumentary (Hair, Skin) Patient appears to have a candidal skin infection around the wound orifice this is mild. Notes Would exam; large wound over the right buttock. This is centered about the right ischial tuberosity. In terms of the tissue around the right ischial tuberosity that was probing some distance last time this is really coming in nicely. There is no palpable bone. No evidence of soft tissue infection. I think we are still making active progress. As noted above he appears to have a candidal type skin rash around the Vac site. According the patient they've actually had the back on hold I don't think this is the right thing to do. They could consider systemic antifungal therapy. Electronic Signature(s) Signed: 08/04/2018 5:56:52 AM By: Baltazar Najjar MD Entered By: Baltazar Najjar on 08/03/2018 18:08:49 [name] ([id number]) -------------------------------------------------------------------------------- Physician Orders Details Patient Name: JAIDEV, SUDBERRY. Date of Service: 08/03/2018 1:30 PM Medical Record Number: [id number] Patient Account Number: [id number] Date of Birth/Sex: 19-Apr-1956 (62 y.o. M) Treating RN: Huel Coventry Primary Care Provider: Sindy Messing Other Clinician: Referring Provider: Sindy Messing Treating Provider/Extender: Altamese [place] in Treatment: 9 Verbal / Phone Orders: No Diagnosis Coding Wound Cleansing Wound #1 Right Gluteus o Clean wound with Normal Saline. o Cleanse wound with mild soap and water Anesthetic (add to Medication List) Wound #1 Right Gluteus o Topical Lidocaine 4% cream applied to wound bed prior to debridement (In Clinic Only). Skin Barriers/Peri-Wound Care Wound #1 Right Gluteus o Skin Prep Primary Wound Dressing Wound #1 Right Gluteus o Saline moistened gauze - until wound vac reapplied at facility o Silver Collagen - silver collagen to  undermining area above ischial tuberosity under NPWT foam  Secondary Dressing Wound #1 Right Gluteus o ABD pad - 3rd layer in clinic only o Dry Gauze - 2nd layer in clinic only o Other - secure with Tegaderm or tape in clinic only Dressing Change Frequency Wound #1 Right Gluteus o Change Dressing Monday, Wednesday, Friday - NPWT Follow-up Appointments Wound #1 Right Gluteus o Return Appointment in 2 weeks. Off-Loading Wound #1 Right Gluteus o Mattress - Air mattress o Turn and reposition every 2 hours Additional Orders / Instructions Wound #1 Right Gluteus o Increase protein intake. o Other: - Pro-stat for wound healing twice daily. RALF, KONOPKA W. ([id number]) Negative Pressure Wound Therapy Wound #1 Right Gluteus o Wound VAC settings at 125/130 mmHg continuous pressure. Use BLACK/GREEN foam to wound cavity. Use WHITE foam to fill any tunnel/s and/or undermining. Change VAC dressing 3 X WEEK. Change canister as indicated when full. Nurse may titrate settings and frequency of dressing changes as clinically indicated. [facility] to reapply o Home Health Nurse may d/c [facility] for s/s of increased infection, significant wound regression, or uncontrolled drainage. [facility] at [phone]. Electronic Signature(s) Signed: 08/04/2018 5:56:52 AM By: Baltazar Najjar MD Signed: 08/04/2018 3:05:38 PM By: Elliot Gurney, BSN, RN, CWS, Kim RN, BSN Entered By: Elliot Gurney, BSN, RN, CWS, Kim on 08/03/2018 14:05:29 MATEUSZ, NEILAN ([id number]) -------------------------------------------------------------------------------- Problem List Details Patient Name: BRACK, SHADDOCK. Date of Service: 08/03/2018 1:30 PM Medical Record Number: [id number] Patient Account Number: [id number] Date of Birth/Sex: 04-Feb-1956 (62 y.o. M) Treating RN: Huel Coventry Primary Care Provider: Sindy Messing Other Clinician: Referring Provider: Sindy Messing Treating Provider/Extender:  Altamese [place] in Treatment: 9 Active Problems ICD-10 Evaluated Encounter Code Description Active Date Today Diagnosis L89.314 Pressure ulcer of right buttock, stage 4 06/01/2018 No Yes M86.18 Other acute osteomyelitis, other site 06/01/2018 No Yes T81.31XD Disruption of external operation (surgical) wound, not 06/01/2018 No Yes elsewhere classified, subsequent encounter E43 Unspecified severe protein-calorie malnutrition 06/01/2018 No Yes Inactive Problems Resolved Problems Electronic Signature(s) Signed: 08/04/2018 5:56:52 AM By: Baltazar Najjar MD Entered By: Baltazar Najjar on 08/03/2018 16:04:39 Weightman, Theo W. ([id number]) -------------------------------------------------------------------------------- Progress Note Details Patient Name: [name]. Date of Service: 08/03/2018 1:30 PM Medical Record Number: [id number] Patient Account Number: [id number] Date of Birth/Sex: 1956/09/09 (62 y.o. M) Treating RN: Huel Coventry Primary Care Provider: Sindy Messing Other Clinician: Referring Provider: Sindy Messing Treating Provider/Extender: Altamese  in Treatment: 9 Subjective History of Present Illness (HPI) ADMISSION 06/01/18 This is a 62 year old man who is sent to [place] currently residing at [facility] skilled facility. He has a complicated recent medical history. His sister accompanies him tells [place] that he has been somewhat disabled since suffering a left basal ganglial CVA in 2018. At that point in time he also had myocardial infarctions. More recently he has been hospitalized from 04/04/18 through 04/12/18 with septic shock, coronary artery disease, congestive heart failure and an acute CVA. During this hospitalization he was noted to have sacral wounds but these were not felt to be the source of infection however the source of the infection was never really determined at that point. I believe he was sent to a skilled facility. He reports that at the  hospital on 05/09/18 through 05/18/18. At that point he was noted to have a large abscess of the right buttock. He required a surgical IandD by Dr. Gerrit Friends of general surgery. Culture of this grew Citrobacter,Eikenella and Bacteroides. He had a CT scan of the pelvis that showed an abnormal  soft tissue collection just caudal to the right ischial tuberosity measuring 5.9 and by 4.7. This also involved the distal right deep muscles. There was some cortical bone loss on the right ischial tuberosity and there was some concern with osteomyelitis. He was felt to require treatment for osteomyelitis based on clinical and radiographic findings. He did not have an MRI. Past medical history includes; abdominal aortic aneurysm, carotid stenosis, hypertension, osteoarthritis, interstitial lung disease on chronic steroids, rheumatoid arthritis, congestive heart failure/ischemic cardiomyopathy with an ejection fraction of 35-40% Also noteworthy is on 05/09/18 his albumin was 2.2 on admission to hospital. I don't think this is actually been repeated. The patient states his appetite is good and he is taking supplements 100%. 06/15/18-He is seen in follow up vibration for right buttock ulcer; this is stable in appearance with minimal amount of non-viable tissue. 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 We've been using a wound VAC. This is centered over the right ischial tuberosity. He is making generally good progress. He was severely hypoalbuminemic at one time although he states that he eats well and takes his supplements. 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 We have been using silver collagen over the superior aspect of the right ischial tuberosity under wound VAC.he has made some nice improvements. The dimensions of the wound  are certainly smaller. He has no exposed bone. We received lab work from [facility]. His prealbumin was 18.6 and albumin at 2.57. This is obviously still low. I am not sure what interventions are in place. I know he is on supplements. I discussed this with the patient. I told him that the albumin represents already moderate to severe protein malnutrition. This would contribute it to do difficulty healing the wound 07/20/18; I received copies of lab work from [facility] on 07/06/18 documenting a sedimentation rate of 25 which seems to be in line with what I see from previous values however his C-reactive protein was 110.8 versus 84.7 on 06/22/18. I have looked through [facility] health Link I don't see a prior value for either inflammatory measure. he has been treated with [name] which I think he is still on for osteomyelitis of the right ischial tuberosity as well as a soft tissue collection just caudad to the right ischial tuberosity itself. Felt to have a soft tissue abscess and/or stigmata of necrotizing fasciitis. I'm not sure who is following this. His CT scan was on 05/09/18 yet he still remains on Invanz. Looking back through the original discharge summary the [name] was supposed to finish on 07/05/18. 08/03/18 the patient continues to make good improvement. The area over the right ischial tuberosity has filled in nicely. His developed some what looks to be candidal skin infection in the periwound. I think they've taken the wound VAC off and are applying nystatin. FREDDY, KINNE. ([id number]) Objective Constitutional Patient is hypotensive.. Pulse regular and within target range for patient.[name] Respirations regular, non-labored and within target range.. Temperature is normal and within the target range for the patient.. Patient looks pale but in no acute distress. Vitals Time Taken: 1:40 PM, Height: 73 in, Weight: 206 lbs, BMI: 27.2, Temperature: 98.0 F, Pulse: 82 bpm, Respiratory Rate: 18  breaths/min, Blood Pressure: 99/66 mmHg. Eyes Conjunctivae clear. No discharge. Respiratory Respiratory effort is easy and symmetric bilaterally. Rate is normal at rest  and on room air.. Cardiovascular Heart rhythm and rate regular, without murmur or gallop.. Gastrointestinal (GI) Nontender no distention. General Notes: Would exam; large wound over the right buttock. This is centered about the right ischial tuberosity. In terms of the tissue around the right ischial tuberosity that was probing some distance last time this is really coming in nicely. There is no palpable bone. No evidence of soft tissue infection. I think we are still making active progress. As noted above he appears to have a candidal type skin rash around the Vac site. According the patient they've actually had the back on hold I don't think this is the right thing to do. They could consider systemic antifungal therapy. Integumentary (Hair, Skin) Patient appears to have a candidal skin infection around the wound orifice this is mild. Wound #1 status is Open. Original cause of wound was Gradually Appeared. The wound is located on the Right Gluteus. The wound measures 10cm length x 6cm width x 3cm depth; 47.124cm^2 area and 141.372cm^3 volume. There is bone, muscle, Fat Layer (Subcutaneous Tissue) Exposed, and fascia exposed. There is no tunneling or undermining noted. There is a large amount of serosanguineous drainage noted. The wound margin is distinct with the outline attached to the wound base. There is large (67-100%) red, pink, hyper - granulation within the wound bed. There is a small (1-33%) amount of necrotic tissue within the wound bed including Adherent Slough. The periwound skin appearance exhibited: Scarring. The periwound skin appearance did not exhibit: Callus, Crepitus, Excoriation, Induration, Rash, Dry/Scaly, Maceration, Atrophie Blanche, Cyanosis, Ecchymosis, Hemosiderin Staining, Mottled, Pallor, Rubor,  Erythema. Periwound temperature was noted as No Abnormality. The periwound has tenderness on palpation. Assessment Active Problems ICD-10 Pressure ulcer of right buttock, stage 4 Rowen, Shray W. ([id number]) Other acute osteomyelitis, other site Disruption of external operation (surgical) wound, not elsewhere classified, subsequent encounter Unspecified severe protein-calorie malnutrition Plan Wound Cleansing: Wound #1 Right Gluteus: Clean wound with Normal Saline. Cleanse wound with mild soap and water Anesthetic (add to Medication List): Wound #1 Right Gluteus: Topical Lidocaine 4% cream applied to wound bed prior to debridement (In Clinic Only). Skin Barriers/Peri-Wound Care: Wound #1 Right Gluteus: Skin Prep Primary Wound Dressing: Wound #1 Right Gluteus: Saline moistened gauze - until wound vac reapplied at facility Silver Collagen - silver collagen to undermining area above ischial tuberosity under NPWT foam Secondary Dressing: Wound #1 Right Gluteus: ABD pad - 3rd layer in clinic only Dry Gauze - 2nd layer in clinic only Other - secure with Tegaderm or tape in clinic only Dressing Change Frequency: Wound #1 Right Gluteus: Change Dressing Monday, Wednesday, Friday - NPWT Follow-up Appointments: Wound #1 Right Gluteus: Return Appointment in 2 weeks. Off-Loading: Wound #1 Right Gluteus: Mattress - Air mattress Turn and reposition every 2 hours Additional Orders / Instructions: Wound #1 Right Gluteus: Increase protein intake. Other: - Pro-stat for wound healing twice daily. Negative Pressure Wound Therapy: Wound #1 Right Gluteus: Wound VAC settings at 125/130 mmHg continuous pressure. Use BLACK/GREEN foam to wound cavity. Use WHITE foam to fill any tunnel/s and/or undermining. Change VAC dressing 3 X WEEK. Change canister as indicated when full. Nurse may titrate settings and frequency of dressing changes as clinically indicated. [facility] to  reapply Home Health Nurse may d/c [facility] for s/s of increased infection, significant wound regression, or uncontrolled drainage. [facility] at [phone]. #1 I recommended restarting the wound VAC. #2 candidal skin changes around wounds under back Seals are  not unusual. This is not particularly ominous ZARION, OLIFF ([id number]) Electronic Signature(s) Signed: 08/04/2018 5:56:52 AM By: Baltazar Najjar MD Entered By: Baltazar Najjar on 08/03/2018 18:09:28 [name] ([id number]) -------------------------------------------------------------------------------- SuperBill Details Patient Name: [name]. Date of Service: 08/03/2018 Medical Record Number: [id number] Patient Account Number: [id number] Date of Birth/Sex: January 15, 1956 (62 y.o. M) Treating RN: Huel Coventry Primary Care Provider: Sindy Messing Other Clinician: Referring Provider: Sindy Messing Treating Provider/Extender: Altamese [name] in Treatment: 9 Diagnosis Coding ICD-10 Codes Code Description L89.314 Pressure ulcer of right buttock, stage 4 M86.18 Other acute osteomyelitis, other site T81.31XD Disruption of external operation (surgical) wound, not elsewhere classified, subsequent encounter E43 Unspecified severe protein-calorie malnutrition Facility Procedures CPT4 Code: [id number] Description: 99213 - WOUND CARE VISIT-LEV 3 EST PT Modifier: Quantity: 1 Physician Procedures CPT4 Code: [id number] Description: 99213 - WC PHYS LEVEL 3 - EST PT ICD-10 Diagnosis Description L89.314 Pressure ulcer of right buttock, stage 4 Modifier: Quantity: 1 Electronic Signature(s) Signed: 08/04/2018 5:56:52 AM By: Baltazar Najjar MD Entered By: Baltazar Najjar on 08/03/2018 18:10:53

## 2018-08-15 ENCOUNTER — Ambulatory Visit (HOSPITAL_COMMUNITY)
Admission: RE | Admit: 2018-08-15 | Discharge: 2018-08-15 | Disposition: A | Discharge status: Home / Self Care | Payer: Medicaid Other | Source: Ambulatory Visit | Attending: Family | Admitting: Family

## 2018-08-15 ENCOUNTER — Other Ambulatory Visit (HOSPITAL_COMMUNITY): Payer: Medicaid Other

## 2018-08-15 ENCOUNTER — Encounter: Payer: Self-pay | Admitting: Family

## 2018-08-15 ENCOUNTER — Ambulatory Visit (INDEPENDENT_AMBULATORY_CARE_PROVIDER_SITE_OTHER): Payer: Medicaid Other | Admitting: Family

## 2018-08-15 VITALS — BP 105/71 | HR 96 | Resp 16 | Ht 72.0 in | Wt 162.0 lb

## 2018-08-15 DIAGNOSIS — Z87891 Personal history of nicotine dependence: Secondary | ICD-10-CM

## 2018-08-15 DIAGNOSIS — I6523 Occlusion and stenosis of bilateral carotid arteries: Secondary | ICD-10-CM | POA: Diagnosis not present

## 2018-08-15 DIAGNOSIS — I714 Abdominal aortic aneurysm, without rupture, unspecified: Secondary | ICD-10-CM

## 2018-08-15 NOTE — Progress Notes (Signed)
[facility] [place]   CC: Follow up Abdominal Aortic Aneurysm and extracranial carotid artery stenosis   History of Present Illness  [name] is a 62 y.o. (1956/03/20) male who presents with chief complaint: follow up for AAA and carotid artery stenosis   Previous studies demonstrate an AAA, measuring 3.9 cm x 4.4 cm.  The patient does not have back or abdominal pain.  The patient is a smoker. Dr. Imogene Burn had been monitoring pt.   Additionally, patient had R CEA done at [facility] in January 2018.    He has a history of COPD, interstitial lung disease, coronary artery disease and recently had a CVA.   He is under treatment by [facility] wound care center for a sacral decubitus ulcer that was complicated by a right buttock abscess and right ischial tuberosity osteomyelitis.  He underwent debridement on 05/09/2018.  Cultures grew Citrobacter, viridans strep, Bacteroides and Eikenella.  He was treated with broad empiric antibiotic then switched to IV ertapenem with a plan to complete 6 weeks of therapy on 06/19/2018.  However, he was readmitted on 06/04/2018 with dysuria.  A CT scan showed a question of cholecystitis and adjacent small abscess but he was seen by general surgery.  They felt that he was completely asymptomatic and this was an inconsequential finding.  He was noted to have a right thigh abscess which was smaller.  However he was having increased drainage from his sacral decubitus so his ertapenem was extended until 07/15/2018.  He states that he is feeling better.  He denies any fever, chills or sweats.  He says that he has been told that his wound is improving.  His wound VAC was removed recently because he developed a rash around his wound.  He resides at [facility], a SNF in [place], [place].   He is not ambulatory, he does not know why he cannot walk. He is receiving physical therapy at the SNF. He has seen a neurologist in the past, daughter does not  recall who.   He had more than one CVA, last one was 05-02-18.   He has mild residual left hemiparesis, denies speech or vision changes from this last stroke. 2018 stroke left him with partial vision loss in his left. .   Diabetic: No Tobacco use: former smoker, started at age 75 years.  He has not smoked since July 2019  Past Medical History:  Diagnosis Date  . AAA (abdominal aortic aneurysm) (HCC)   . AKI (acute kidney injury) (HCC)    a. Cr up to 8 in 04/2018.  . Arthritis   . CAD in native artery    a. h/o MI x 3 s/p CABG x 4 (1993 with Tyrone Sage). b. LHC 01/2017 with severe native disease, 2 grafts occluded.  . Carotid artery disease (HCC)    a. s/p R CEA 11/2016.  [name] Chronic combined systolic and diastolic CHF (congestive heart failure) (HCC)   . GERD (gastroesophageal reflux disease)   . Hyperkalemia   . ILD (interstitial lung disease) (HCC)   . Lung nodule   . MI (myocardial infarction) (HCC)    x 3   . Normocytic anemia   . NSVT (nonsustained ventricular tachycardia) (HCC)   . Pancreatitis    1/19  . Pneumonia 11/2017  . RA (rheumatoid arthritis) (HCC)   . Stroke (HCC)    a. 2018. b. possible recurrence in 04/2018, seen by neuro  . Tobacco abuse    Past  Surgical History:  Procedure Laterality Date  . CARDIAC CATHETERIZATION    . CAROTID ENDARTERECTOMY Left   . Heart Bypass  10/1992  . INCISION AND DRAINAGE PERIRECTAL ABSCESS Right 05/09/2018   Procedure: IRRIGATION AND DEBRIDEMENT  RIGHT BUTTOCK ABSCESS;  Surgeon: Darnell Level, MD;  Location: [facility] ORS;  Service: General;  Laterality: Right;  . LEFT HEART CATH AND CORS/GRAFTS ANGIOGRAPHY N/A 03/01/2017   Procedure: Left Heart Cath and Cors/Grafts Angiography;  Surgeon: Lyn Records, MD;  Location: [facility] INVASIVE CV LAB;  Service: Cardiovascular;  Laterality: N/A;  . Throat Biopsy     Cat scratch fever   Social History Social History   Socioeconomic History  . Marital status: Single    Spouse name: Not on file  . Number  of children: 1  . Years of education: Not on file  . Highest education level: Not on file  Occupational History  . 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 Financial resource strain: Not on file  . Food insecurity:    Worry: Not on file    Inability: Not on file  . Transportation needs:    Medical: Not on file    Non-medical: Not on file  Tobacco Use  . Smoking status: Current Every Day Smoker    Packs/day: 0.50    Years: 47.00    Pack years: 23.50    Types: Cigarettes  . Smokeless tobacco: Never Used  . Tobacco comment: half pack every other day  Substance and Sexual Activity  . Alcohol use: No    Comment: remote history of beer drinking  . Drug use: No  . Sexual activity: Not on file  Lifestyle  . Physical activity:    Days per week: Not on file    Minutes per session: Not on file  . Stress: Not on file  Relationships  . Social connections:    Talks on phone: Not on file    Gets together: Not on file    Attends religious service: Not on file    Active member of club or organization: Not on file    Attends meetings of clubs or organizations: Not on file    Relationship status: Not on file  . Intimate partner violence:    Fear of current or ex partner: Not on file    Emotionally abused: Not on file    Physically abused: Not on file    Forced sexual activity: Not on file  Other Topics Concern  . Not on file  Social History Narrative  . Not on file   Family History Family History  Problem Relation Age of Onset  . Heart murmur Mother   . Heart disease Mother   . Heart attack Mother   . Suicidality Father   . Heart attack Father   . Stroke Brother   . Heart attack Brother   . Heart attack Maternal Grandmother   . Cancer Maternal Grandmother        unknown type  . Lung cancer Maternal Aunt   . Lung cancer Maternal Uncle   . Cancer Maternal Aunt        unknown cancer  . Heart attack Brother     Current Outpatient  Medications on File Prior to Visit  Medication Sig Dispense Refill  . acetaminophen (TYLENOL) 500 MG tablet Take 1,000 mg by mouth every 6 (six) hours as needed for mild pain.     [name] albuterol (PROVENTIL) (2.5  MG/3ML) 0.083% nebulizer solution Take 2.5 mg by nebulization every 6 (six) hours as needed for wheezing or shortness of breath.    . Amino Acids-Protein Hydrolys (FEEDING SUPPLEMENT, PRO-STAT SUGAR FREE 64,) LIQD Take 30 mLs by mouth 2 (two) times daily. 900 mL 0  . aspirin EC 81 MG tablet Take by mouth.    [name] atorvastatin (LIPITOR) 40 MG tablet Take 1 tablet (40 mg total) by mouth daily at 6 PM. (Patient taking differently: Take 80 mg by mouth daily at 6 PM. ) 30 tablet 11  . carvedilol (COREG) 6.25 MG tablet Take 1 tablet (6.25 mg total) by mouth 2 (two) times daily with a meal. 60 tablet 0  . cephALEXin (KEFLEX) 500 MG capsule Take 500 mg by mouth 3 (three) times daily.    . clopidogrel (PLAVIX) 75 MG tablet Take 75 mg by mouth daily.    [name] enoxaparin (LOVENOX) 40 MG/0.4ML injection Inject 0.4 mLs (40 mg total) into the skin daily. 0 Syringe   . furosemide (LASIX) 20 MG tablet Take by mouth.    . gabapentin (NEURONTIN) 300 MG capsule Take 1 capsule (300 mg total) by mouth every 8 (eight) hours. 90 capsule 4  . HYDROcodone-acetaminophen (NORCO) 10-325 MG tablet Take 1 tablet by mouth 3 (three) times daily. 15 tablet 0  . hydroxychloroquine (PLAQUENIL) 200 MG tablet Take 1 tablet (200 mg total) by mouth 2 (two) times daily.  0  . ipratropium-albuterol (DUONEB) 0.5-2.5 (3) MG/3ML SOLN Take 3 mLs by nebulization 2 (two) times daily. 360 mL   . magnesium hydroxide (MILK OF MAGNESIA) 400 MG/5ML suspension Take 30 mLs by mouth daily as needed for mild constipation. And QHS    . Menthol-Methyl Salicylate (MUSCLE RUB EX) Apply 1 application topically as needed (for shoulder or knee pain).     . mirtazapine (REMERON) 7.5 MG tablet Take 7.5 mg by mouth at bedtime.    [name] morphine (MSIR) 30 MG tablet  Take 1 tablet (30 mg total) by mouth every 4 (four) hours as needed (breakthrough pain). 12 tablet 0  . Multiple Vitamin (MULTIVITAMIN WITH MINERALS) TABS tablet Take 1 tablet by mouth daily.    . nicotine (NICODERM CQ - DOSED IN MG/24 HR) 7 mg/24hr patch Place 1 patch (7 mg total) onto the skin daily. 28 patch 0  . nutrition supplement, JUVEN, (JUVEN) PACK Take 1 packet by mouth 2 (two) times daily between meals.  0  . nystatin cream (MYCOSTATIN) Apply topically 2 (two) times daily. (Patient taking differently: Apply 1 application topically 2 (two) times daily. ) 30 g 0  . ondansetron (ZOFRAN) 4 MG tablet Take 1 tablet (4 mg total) by mouth every 6 (six) hours as needed for nausea. 20 tablet 0  . polyethylene glycol (MIRALAX / GLYCOLAX) packet Take 17 g by mouth 2 (two) times daily. Until having 1 soft BM daily, then can decrease to daily or as needed (Patient taking differently: Take 17 g by mouth daily. Until having 1 soft BM daily, then can decrease to daily or as needed) 14 each 0  . potassium chloride SA (K-DUR,KLOR-CON) 20 MEQ tablet Take 2 tablets (40 mEq total) by mouth daily. (Patient taking differently: Take 20 mEq by mouth daily. )    . predniSONE (DELTASONE) 10 MG tablet Take 10 mg by mouth daily with breakfast.    . PROVENTIL HFA 108 (90 Base) MCG/ACT inhaler INHALE 2 PUFFS EVERY 6 HOURS AS NEEDED FOR WHEEZE/SHORTNESS OF BREATH 6.7 g 6  .  saccharomyces boulardii (FLORASTOR) 250 MG capsule Take 1 capsule (250 mg total) by mouth 2 (two) times daily.    . tamsulosin (FLOMAX) 0.4 MG CAPS capsule Take 1 capsule (0.4 mg total) by mouth daily after supper. 30 capsule 4  . UNABLE TO FIND Take 90 mLs by mouth 2 (two) times daily. Medpass    . sacubitril-valsartan (ENTRESTO) 49-51 MG Take by mouth.     No current facility-administered medications on file prior to visit.    No Known Allergies  ROS: See HPI for pertinent positives and negatives.  Physical Examination  Vitals:   08/15/18  0946 08/15/18 0947  BP: 105/70 105/71  Pulse: 99 96  Resp: 16   SpO2: 99% 100%  Weight: 162 lb (73.5 kg)   Height: 6' (1.829 m)    Body mass index is 21.97 kg/m.  General: A&O x 3, WD, male supine on transport stretcher. Accompanied by his sister and 2 transport personnel.  HEENT: Grossly intact and WNL. Full facial beard Pulmonary: Sym exp, respirations are non labored, fair air movement in all fields, no rales or rhonchi, transient wheezes in right fields. Cardiac: Regular rhythm and rate, no detected murmur.  Carotid Bruits Right Left   Negative Negative   Adominal aortic pulse is not palpable Radial pulses: not palpable on the right, right brachial is 1+ palpable; left radial is 1+ palpable.                           VASCULAR EXAM:                                                                                                         LE Pulses Right Left       FEMORAL   palpable   palpable        POPLITEAL  not palpable   not palpable       POSTERIOR TIBIAL  2+ palpable   1+palpable        DORSALIS PEDIS      ANTERIOR TIBIAL not palpable  not palpable     Gastrointestinal: soft, NTND, -G/R, - HSM, - masses palpated, - CVAT B. Musculoskeletal: M/S 4/5 in the right UE  LE, 3/5 in the left UE and LE, Extremities without ischemic changes. Skin: No rashes, no cellulitis.  Wound vac connected to sacral ulcer. No heel ulcers, no other ulcers noted.  Neurologic: CN 2-12 intact, Pain and light touch intact in extremities are intact, Motor exam as listed above. Psychiatric: Normal thought content, mood appropriate to clinical situation.     DATA  Carotid Duplex (08-15-18): Right ICA: CEA site with no stenosis Left ICA: 40-59% stenosis Right ECA: >50% stenosis Bilateral vertebral artery flow is antegrade.  Bilateral subclavian artery waveforms are normal.  Less stenosis in bilateral ICA compared to the exam on 02-09-18.    AAA Duplex (08/15/2018):  Previous size:  4.3 cm (Date: 02/09/2018)   CTA abd/pelvis with contrast on 06-04-18, requested by Dr. Sheria Lang for Nausea and vomiting. Hematuria. Pyelonephritis.  Pelvic decubitus ulcers. Abdominal aortic aneurysm: Vascular/Lymphatic: No pathologically enlarged lymph nodes. Infrarenal abdominal aortic aneurysm is seen which measures 4.6 cm in maximum diameter, without significant change since previous study. No evidence of aneurysm leak or rupture.  Medical Decision Making  The patient is a 62 y.o. male who presents with asymptomatic AAA with 4.6 cm size by CTA on 06-04-18.  He is s/p  R CEA done at [facility] in January 2018.  He had more than one CVA, last one was 05-02-18.  He has mild left hemiparesis. He and his sister do not know why he is unable to walk.    Based on this patient's exam and diagnostic studies, the patient will follow up in 6 months with the following studies: AAA duplex, carotid duplex in a year.  Consideration for repair of AAA would be made when the size is 5.0 cm, growth > 1 cm/yr, and symptomatic status.        The patient was given information about AAA including signs, symptoms, treatment, and how to minimize the risk of enlargement and rupture of aneurysms.    I emphasized the importance of maximal medical management including strict control of blood pressure, blood glucose, and lipid levels, antiplatelet agents, obtaining regular exercise, and cessation of smoking.   The patient was advised to call 911 should the patient experience sudden onset abdominal or back pain.   Thank you for allowing [place] to participate in this patient's care.  Charisse March, RN, MSN, FNP-C [facility] [place] Office: [phone]  Clinic Physician: Darrick Penna on call  08/15/2018, 10:15 AM

## 2018-08-15 NOTE — Patient Instructions (Addendum)
Before your next abdominal ultrasound:  Avoid gas forming foods and beverages the day before the test.   Take two Extra-Strength Gas-X capsules at bedtime the night before the test. Take another two Extra-Strength Gas-X capsules in the middle of the night if you get up to the restroom, if not, first thing in the morning with water.  Do not chew gum.      Abdominal Aortic Aneurysm Blood pumps away from the heart through tubes (blood vessels) called arteries. Aneurysms are weak or damaged places in the wall of an artery. It bulges out like a balloon. An abdominal aortic aneurysm happens in the main artery of the body (aorta). It can burst or tear, causing bleeding inside the body. This is an emergency. It needs treatment right away. What are the causes? The exact cause is unknown. Things that could cause this problem include:  Fat and other substances building up in the lining of a tube.  Swelling of the walls of a blood vessel.  Certain tissue diseases.  Belly (abdominal) trauma.  An infection in the main artery of the body.  What increases the risk? There are things that make it more likely for you to have an aneurysm. These include:  Being over the age of 63 years old.  Having high blood pressure (hypertension).  Being a male.  Being white.  Being very overweight (obese).  Having a family history of aneurysm.  Using tobacco products.  What are the signs or symptoms? Symptoms depend on the size of the aneurysm and how fast it grows. There may not be symptoms. If symptoms occur, they can include:  Pain (belly, side, lower back, or groin).  Feeling full after eating a small amount of food.  Feeling sick to your stomach (nauseous), throwing up (vomiting), or both.  Feeling a lump in your belly that feels like it is beating (pulsating).  Feeling like you will pass out (faint).  How is this treated?  Medicine to control blood pressure and pain.  Imaging tests to  see if the aneurysm gets bigger.  Surgery. How is this prevented? To lessen your chance of getting this condition:  Stop smoking. Stop chewing tobacco.  Limit or avoid alcohol.  Keep your blood pressure, blood sugar, and cholesterol within normal limits.  Eat less salt.  Eat foods low in saturated fats and cholesterol. These are found in animal and whole dairy products.  Eat more fiber. Fiber is found in whole grains, vegetables, and fruits.  Keep a healthy weight.  Stay active and exercise often.  This information is not intended to replace advice given to you by your health care provider. Make sure you discuss any questions you have with your health care provider. Document Released: 02/13/2013 Document Revised: 03/26/2016 Document Reviewed: 11/18/2012 Elsevier Interactive Patient Education  2017 [employer]  

## 2018-08-17 ENCOUNTER — Encounter: Payer: Medicaid Other | Admitting: Internal Medicine

## 2018-08-17 DIAGNOSIS — L89314 Pressure ulcer of right buttock, stage 4: Secondary | ICD-10-CM | POA: Diagnosis not present

## 2018-08-19 NOTE — Progress Notes (Signed)
[name], [name] ([id number]) Visit Report for 08/17/2018 Arrival Information Details Patient Name: [name], [name] Date of Service: 08/17/2018 2:45 PM Medical Record Number: [id number] Patient Account Number: [id number] Date of Birth/Sex: 21-Nov-1955 (62 y.o. M) Treating RN: [name] Primary Care [name]: [name] Other Clinician: Referring [name]: [name] Treating [name]/Extender: [name] in Treatment: 11 Visit Information History Since Last Visit Added or deleted any medications: No Patient Arrived: Stretcher Any new allergies or adverse reactions: No Arrival Time: 15:07 Had a fall or experienced change in No Accompanied By: sister activities of daily living that may affect Transfer Assistance: Stretcher risk of falls: Patient Identification Verified: Yes Signs or symptoms of abuse/neglect since last visito No Secondary Verification Process Completed: Yes Hospitalized since last visit: No Patient Requires Transmission-Based No Implantable device outside of the clinic excluding No Precautions: cellular tissue based products placed in the center Patient Has Alerts: No since last visit: Has Dressing in Place as Prescribed: Yes Pain Present Now: No Electronic Signature(s) Signed: 08/17/2018 4:28:03 PM By: [name], RRT, CHT Entered By: [name] on 08/17/2018 15:09:07 [name], [name] ([id number]) -------------------------------------------------------------------------------- Clinic Level of Care Assessment Details Patient Name: [name] Date of Service: 08/17/2018 2:45 PM Medical Record Number: [id number] Patient Account Number: [id number] Date of Birth/Sex: [birth year] (62 y.o. M) Treating RN: [name] Primary Care Jaynell Castagnola: [name] Other Clinician: Referring [name]: [name] Treating [name]/Extender: [name] in Treatment: 11 Clinic Level of Care  Assessment Items TOOL 4 Quantity Score []  - Use when only an EandM is performed on FOLLOW-UP visit 0 ASSESSMENTS - Nursing Assessment / Reassessment []  - Reassessment of Co-morbidities (includes updates in patient status) 0 X- 1 5 Reassessment of Adherence to Treatment Plan ASSESSMENTS - Wound and Skin Assessment / Reassessment X - Simple Wound Assessment / Reassessment - one wound 1 5 []  - 0 Complex Wound Assessment / Reassessment - multiple wounds []  - 0 Dermatologic / Skin Assessment (not related to wound area) ASSESSMENTS - Focused Assessment []  - Circumferential Edema Measurements - multi extremities 0 []  - 0 Nutritional Assessment / Counseling / Intervention []  - 0 Lower Extremity Assessment (monofilament, tuning fork, pulses) []  - 0 Peripheral Arterial Disease Assessment (using hand held doppler) ASSESSMENTS - Ostomy and/or Continence Assessment and Care []  - Incontinence Assessment and Management 0 []  - 0 Ostomy Care Assessment and Management (repouching, etc.) PROCESS - Coordination of Care X - Simple Patient / Family Education for ongoing care 1 15 []  - 0 Complex (extensive) Patient / Family Education for ongoing care []  - 0 Staff obtains , Records, Test Results / Process Orders []  - 0 Staff telephones HHA, Nursing Homes / Clarify orders / etc []  - 0 Routine Transfer to another Facility (non-emergent condition) []  - 0 Routine Hospital Admission (non-emergent condition) []  - 0 New Admissions / / Ordering NPWT, Apligraf, etc. []  - 0 Emergency Hospital Admission (emergent condition) X- 1 10 Simple Discharge Coordination [name], [name] ( ) []  - 0 Complex (extensive) Discharge Coordination PROCESS - Special Needs []  - Pediatric / Minor Patient Management 0 []  - 0 Isolation Patient Management []  - 0 Hearing / Language / Visual special needs []  - 0 Assessment of Community assistance (transportation, D/C planning,  etc.) []  - 0 Additional assistance / Altered mentation []  - 0 Support Surface(s) Assessment (bed, cushion, seat, etc.) INTERVENTIONS - Wound Cleansing / Measurement X - Simple Wound Cleansing - one wound 1 5 []  -  0 Complex Wound Cleansing - multiple wounds X- 1 5 Wound Imaging (photographs - any number of wounds) []  - 0 Wound Tracing (instead of photographs) X- 1 5 Simple Wound Measurement - one wound []  - 0 Complex Wound Measurement - multiple wounds INTERVENTIONS - Wound Dressings []  - Small Wound Dressing one or multiple wounds 0 X- 1 15 Medium Wound Dressing one or multiple wounds []  - 0 Large Wound Dressing one or multiple wounds []  - 0 Application of Medications - topical []  - 0 Application of Medications - injection INTERVENTIONS - Miscellaneous []  - External ear exam 0 []  - 0 Specimen Collection (cultures, biopsies, blood, body fluids, etc.) []  - 0 Specimen(s) / Culture(s) sent or taken to Lab for analysis X- 1 10 Patient Transfer (multiple staff / Nurse, adult / Similar devices) []  - 0 Simple Staple / Suture removal (25 or less) []  - 0 Complex Staple / Suture removal (26 or more) []  - 0 Hypo / Hyperglycemic Management (close monitor of Blood Glucose) []  - 0 Ankle / Brachial Index (ABI) - do not check if billed separately X- 1 5 Vital Signs [name], [name] ([id number]) Has the patient been seen at the hospital within the last three years: Yes Total Score: 80 Level Of Care: New/Established - Level 3 Electronic Signature(s) Signed: 08/17/2018 5:17:02 PM By: [name], BSN, RN, CWS, [name] Entered By: [name], BSN, RN, CWS, Ryan on 08/17/2018 17:16:33 [name] ([id number]) -------------------------------------------------------------------------------- Encounter Discharge Information Details Patient Name: [name] Date of Service: 08/17/2018 2:45 PM Medical Record Number: [id number] Patient Account Number: [id number] Date of Birth/Sex:  Apr 23, 1956 (62 y.o. M) Treating RN: [name] Primary Care [name]: [name] Other Clinician: Referring Kelsi Benham: [name] Treating [name]: Ryan [place] in Treatment: 11 Encounter Discharge Information Items Discharge Condition: Stable Ambulatory Status: Stretcher Discharge Destination: Skilled Nursing Facility Telephoned: No Orders Sent: Yes Transportation: Ambulance Accompanied By: sister Schedule Follow-up Appointment: Yes Clinical Summary of Care: Electronic Signature(s) Signed: 08/17/2018 5:11:32 PM By: [name], BSN, RN, CWS, [name] Entered By: [name], BSN, RN, CWS, Ryan on 08/17/2018 17:11:32 [name] ([id number]) -------------------------------------------------------------------------------- Lower Extremity Assessment Details Patient Name: [name], [name] Date of Service: 08/17/2018 2:45 PM Medical Record Number: [id number] Patient Account Number: [id number] Date of Birth/Sex: 1955/12/11 (62 y.o. M) Treating RN: Curtis Sites Primary Care Anadia Helmes: [name] Other Clinician: Referring Halcyon Heck: [name] Treating Jaydyn Menon/Extender: Maxwell Caul Weeks in Treatment: 11 Electronic Signature(s) Signed: 08/17/2018 5:19:13 PM By: Curtis Sites Entered By: Curtis Sites on 08/17/2018 15:19:42 Posada, Francesco W. ([id number]) -------------------------------------------------------------------------------- Multi Wound Chart Details Patient Name: [name] Date of Service: 08/17/2018 2:45 PM Medical Record Number: [id number] Patient Account Number: [id number] Date of Birth/Sex: [birth year] (62 y.o. M) Treating RN: Rema Jasmine Primary Care Kalea Perine: [name] Other Clinician: Referring Tavis Kring: [name] Treating Geonna Lockyer/Extender: Ryan [place] in Treatment: 11 Vital Signs Height(in): 73 Pulse(bpm): 83 Weight(lbs): 206 Blood Pressure(mmHg): 112/69 Body Mass Index(BMI): 27 Temperature(F):  98.3 Respiratory Rate 16 (breaths/min): Photos: [N/A:N/A] Wound Location: Right Gluteus N/A N/A Wounding Event: Gradually Appeared N/A N/A Primary Etiology: Pressure Ulcer N/A N/A Comorbid History: Cataracts, Congestive Heart N/A N/A Failure, Coronary Artery Disease, Hypertension, Myocardial Infarction, History of pressure wounds, Rheumatoid Arthritis Date Acquired: 05/09/2018 N/A N/A Weeks of Treatment: 11 N/A N/A Wound Status: Open N/A N/A Measurements L x W x D 7.5x4.4x1.9 N/A N/A (cm) Area (cm) : 25.918 N/A N/A Volume (cm) : 49.244 N/A N/A % Reduction in  Area: 78.90% N/A N/A % Reduction in Volume: 90.90% N/A N/A Classification: Category/Stage IV N/A N/A Exudate Amount: Large N/A N/A Exudate Type: Serosanguineous N/A N/A Exudate Color: red, brown N/A N/A Wound Margin: Distinct, outline attached N/A N/A Granulation Amount: Large (67-100%) N/A N/A Granulation Quality: Red, Pink, Hyper-granulation N/A N/A Necrotic Amount: Small (1-33%) N/A N/A Exposed Structures: Fascia: Yes N/A N/A Fat Layer (Subcutaneous Tissue) Exposed: Yes [name], [name] ([id number]) Muscle: Yes Bone: Yes Epithelialization: None N/A N/A Periwound Skin Texture: Scarring: Yes N/A N/A Excoriation: No Induration: No Callus: No Crepitus: No Rash: No Periwound Skin Moisture: Maceration: No N/A N/A Dry/Scaly: No Periwound Skin Color: Atrophie Blanche: No N/A N/A Cyanosis: No Ecchymosis: No Erythema: No Hemosiderin Staining: No Mottled: No Pallor: No Rubor: No Temperature: No Abnormality N/A N/A Tenderness on Palpation: Yes N/A N/A Wound Preparation: Ulcer Cleansing: N/A N/A Rinsed/Irrigated with Saline Topical Anesthetic Applied: Other: lidocaine 4% Treatment Notes Electronic Signature(s) Signed: 08/17/2018 4:50:13 PM By: Baltazar Najjar MD Previous Signature: 08/17/2018 4:03:08 PM Version By: Rema Jasmine Entered By: Baltazar Najjar on 08/17/2018 16:33:52 Galli, [name]  ([id number]) -------------------------------------------------------------------------------- Multi-Disciplinary Care Plan Details Patient Name: [name], [name] Date of Service: 08/17/2018 2:45 PM Medical Record Number: [id number] Patient Account Number: [id number] Date of Birth/Sex: 1955-12-23 (62 y.o. M) Treating RN: Rema Jasmine Primary Care Roman Dubuc: [name] Other Clinician: Referring Dontavius Keim: [name] Treating Amber Williard/Extender: Ryan [place] in Treatment: 11 Active Inactive ` Necrotic Tissue Nursing Diagnoses: Knowledge deficit related to management of necrotic/devitalized tissue Goals: Necrotic/devitalized tissue will be minimized in the wound bed Date Initiated: 06/15/2018 Target Resolution Date: 06/29/2018 Goal Status: Active Interventions: Assess patient pain level pre-, during and post procedure and prior to discharge Treatment Activities: Apply topical anesthetic as ordered : 06/15/2018 Excisional debridement : 06/15/2018 Notes: ` Orientation to the Wound Care Program Nursing Diagnoses: Knowledge deficit related to the wound healing center program Goals: Patient/caregiver will verbalize understanding of the Wound Healing Center Program Date Initiated: 06/15/2018 Target Resolution Date: 07/01/2018 Goal Status: Active Interventions: Provide education on orientation to the wound center Notes: ` Pressure Nursing Diagnoses: Knowledge deficit related to management of pressures ulcers Goals: Patient will remain free from development of additional pressure ulcers [name], [name] ([id number]) Date Initiated: 06/15/2018 Target Resolution Date: 07/01/2018 Goal Status: Active Interventions: Assess: immobility, friction, shearing, incontinence upon admission and as needed Notes: ` Wound/Skin Impairment Nursing Diagnoses: Impaired tissue integrity Goals: Ulcer/skin breakdown will have a volume reduction of 80% by week 12 Date Initiated:  06/15/2018 Target Resolution Date: 09/15/2018 Goal Status: Active Interventions: Assess ulceration(s) every visit Treatment Activities: Skin care regimen initiated : 06/15/2018 Notes: Electronic Signature(s) Signed: 08/17/2018 4:03:08 PM By: Rema Jasmine Entered By: Rema Jasmine on 08/17/2018 15:36:53 [name], [name] ([id number]) -------------------------------------------------------------------------------- Pain Assessment Details Patient Name: [name] Date of Service: 08/17/2018 2:45 PM Medical Record Number: [id number] Patient Account Number: [id number] Date of Birth/Sex: 04-Oct-1956 (62 y.o. M) Treating RN: [name] Primary Care Chrisette Man: [name] Other Clinician: Referring Ezekial Arns: [name] Treating Earnstine Meinders/Extender: Ryan [place] in Treatment: 11 Active Problems Location of Pain Severity and Description of Pain Patient Has Paino No Site Locations Pain Management and Medication Current Pain Management: Electronic Signature(s) Signed: 08/17/2018 4:28:03 PM By: [name], RRT, CHT Signed: 08/17/2018 5:17:02 PM By: [name], BSN, RN, CWS, [name] Entered By: [name] on 08/17/2018 15:09:15 [name] ([id number]) -------------------------------------------------------------------------------- Patient/Caregiver Education Details Patient Name: [name], [name] Date of Service: 08/17/2018 2:45  PM Medical Record Number: [id number] Patient Account Number: [id number] Date of Birth/Gender: 1956/05/21 (62 y.o. M) Treating RN: [name] Primary Care Physician: [name] Other Clinician: Referring Physician: Sindy [name] Treating Physician/Extender: Ryan [place] in Treatment: 11 Education Assessment Education Provided To: Caregiver Education Topics Provided Wound/Skin Impairment: Handouts: Caring for Your Ulcer Methods: Demonstration, Explain/Verbal Responses: State content  correctly Electronic Signature(s) Signed: 08/17/2018 5:17:02 PM By: [name], BSN, RN, CWS, [name] Entered By: [name], BSN, RN, CWS, Ryan on 08/17/2018 17:11:46 [name] ([id number]) -------------------------------------------------------------------------------- Wound Assessment Details Patient Name: [name], [name] Date of Service: 08/17/2018 2:45 PM Medical Record Number: [id number] Patient Account Number: [id number] Date of Birth/Sex: 05-30-1956 (62 y.o. M) Treating RN: Rema Jasmine Primary Care Rosealee Recinos: [name] Other Clinician: Referring Aubreyana Saltz: [name] Treating Kerry-Anne Mezo/Extender: Ryan [place] in Treatment: 11 Wound Status Wound Number: 1 Primary Pressure Ulcer Etiology: Wound Location: Right Gluteus Wound Open Wounding Event: Gradually Appeared Status: Date Acquired: 05/09/2018 Comorbid Cataracts, Congestive Heart Failure, Coronary Weeks Of Treatment: 11 History: Artery Disease, Hypertension, Myocardial Clustered Wound: No Infarction, History of pressure wounds, Rheumatoid Arthritis Photos Photo Uploaded By: Rema Jasmine on 08/17/2018 15:57:55 Wound Measurements Length: (cm) 7.5 Width: (cm) 4.4 Depth: (cm) 1.9 Area: (cm) 25.918 Volume: (cm) 49.244 % Reduction in Area: 78.9% % Reduction in Volume: 90.9% Epithelialization: None Tunneling: No Undermining: No Wound Description Classification: Category/Stage IV Foul Odo Wound Margin: Distinct, outline attached Slough/F Exudate Amount: Large Exudate Type: Serosanguineous Exudate Color: red, brown r After Cleansing: No ibrino Yes Wound Bed Granulation Amount: Large (67-100%) Exposed Structure Granulation Quality: Red, Pink, Hyper-granulation Fascia Exposed: Yes Necrotic Amount: Small (1-33%) Fat Layer (Subcutaneous Tissue) Exposed: Yes Necrotic Quality: Adherent Slough Muscle Exposed: Yes Necrosis of Muscle: No Bone Exposed: Yes Periwound Skin Texture Kaigler, Catlin W.  ([id number]) Texture Color No Abnormalities Noted: No No Abnormalities Noted: No Callus: No Atrophie Blanche: No Crepitus: No Cyanosis: No Excoriation: No Ecchymosis: No Induration: No Erythema: No Rash: No Hemosiderin Staining: No Scarring: Yes Mottled: No Pallor: No Moisture Rubor: No No Abnormalities Noted: No Dry / Scaly: No Temperature / Pain Maceration: No Temperature: No Abnormality Tenderness on Palpation: Yes Wound Preparation Ulcer Cleansing: Rinsed/Irrigated with Saline Topical Anesthetic Applied: Other: lidocaine 4%, Treatment Notes Wound #1 (Right Gluteus) Notes wet to dry until back in facility. Facility applied NPWT. Electronic Signature(s) Signed: 08/17/2018 4:03:08 PM By: Rema Jasmine Entered By: Rema Jasmine on 08/17/2018 15:27:20 [name], [name] ([id number]) -------------------------------------------------------------------------------- Vitals Details Patient Name: [name], [name] Date of Service: 08/17/2018 2:45 PM Medical Record Number: [id number] Patient Account Number: [id number] Date of Birth/Sex: 17-Feb-1956 (62 y.o. M) Treating RN: [name] Primary Care Josejuan Hoaglin: [name] Other Clinician: Referring Kery Batzel: [name] Treating Kenedi Cilia/Extender: Ryan [place] in Treatment: 11 Vital Signs Time Taken: 15:05 Temperature (F): 98.3 Height (in): 73 Pulse (bpm): 83 Weight (lbs): 206 Respiratory Rate (breaths/min): 16 Body Mass Index (BMI): 27.2 Blood Pressure (mmHg): 112/69 Reference Range: 80 - 120 mg / dl Electronic Signature(s) Signed: 08/17/2018 4:28:03 PM By: [name], RRT, CHT Entered By: [name] on 08/17/2018 15:10:56

## 2018-08-19 NOTE — Progress Notes (Signed)
[name] ([id number]) Visit Report for 08/17/2018 HPI Details Patient Name: [name], [name] Date of Service: 08/17/2018 2:45 PM Medical Record Number: [id number] Patient Account Number: [id number] Date of Birth/Sex: 1955-11-23 (62 y.o. M) Treating RN: [name] Primary Care Provider: Sindy [name] Other Clinician: Referring Provider: Sindy [name] Treating Provider/Extender: [name] in Treatment: 11 History of Present Illness HPI Description: ADMISSION 06/01/18 This is a 62 year old man who is sent to [place] currently residing at [facility] skilled facility. He has a complicated recent medical history. His sister accompanies him tells [place] that he has been somewhat disabled since suffering a left basal ganglial CVA in 2018. At that point in time he also had myocardial infarctions. More recently he has been hospitalized from 04/04/18 through 04/12/18 with septic shock, coronary artery disease, congestive heart failure and an acute CVA. During this hospitalization he was noted to have sacral wounds but these were not felt to be the source of infection however the source of the infection was never really determined at that point. I believe he was sent to a skilled facility. He reports that at the hospital on 05/09/18 through 05/18/18. At that point he was noted to have a large abscess of the right buttock. He required a surgical IandD by Dr. Gerrit [name] of general surgery. Culture of this grew Citrobacter,Eikenella and Bacteroides. He had a CT scan of the pelvis that showed an abnormal soft tissue collection just caudal to the right ischial tuberosity measuring 5.9 and by 4.7. This also involved the distal right deep muscles. There was some cortical bone loss on the right ischial tuberosity and there was some concern with osteomyelitis. He was felt to require treatment for osteomyelitis based on clinical and radiographic findings. He did not have an MRI. Past medical history includes;  abdominal aortic aneurysm, carotid stenosis, hypertension, osteoarthritis, interstitial lung disease on chronic steroids, rheumatoid arthritis, congestive heart failure/ischemic cardiomyopathy with an ejection fraction of 35-40% Also noteworthy is on 05/09/18 his albumin was 2.2 on admission to hospital. I don't think this is actually been repeated. The patient states his appetite is good and he is taking supplements 100%. 06/15/18-He is seen in follow up vibration for right buttock ulcer; this is stable in appearance with minimal amount of non-viable tissue. 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 We've been using a wound VAC. This is centered over the right ischial tuberosity. He is making generally good progress. He was severely hypoalbuminemic at one time although he states that he eats well and takes his supplements. 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 We have been using silver collagen over the superior aspect of the right ischial tuberosity under wound VAC.he has made some nice improvements. The dimensions of the wound are certainly smaller. He has no exposed bone. We received lab work from [facility]. His prealbumin was 18.6 and albumin at 2.57. This is obviously still low. I am not sure what interventions are in place. I know he is on supplements. I discussed this with the patient. I told him that the albumin represents already moderate to severe protein malnutrition. This would contribute it to do difficulty healing the wound 07/20/18; I received copies of lab work from [facility] on 07/06/18 documenting a sedimentation rate of 25 which seems to be in line with what I see  from previous values however his C-reactive protein was 110.8 versus 84.7 on 06/22/18. I have looked through [facility] health Link I  don't see a prior value for either inflammatory measure. he has been treated with [name] which I think he is still on for osteomyelitis of the right ischial tuberosity as well as a soft tissue collection just caudad to the right ischial tuberosity itself. Felt to have a soft tissue abscess and/or stigmata of necrotizing fasciitis. I'm not sure who is following this. His CT scan was on 05/09/18 yet he still remains on Invanz. Looking back through the original discharge summary the [name] was supposed to finish on 07/05/18. 08/03/18 the patient continues to make good improvement. The area over the right ischial tuberosity has filled in nicely. His developed some what looks to be candidal skin infection in the periwound. I think they've taken the wound VAC off and are applying nystatin [name], [name] ([id number]) 08/17/18; 2 week follow-up for this resident from [place] [facility]. They resumed the wound VAC. The area continues to fill in nicely. The only problem area is the tissue over the ischial tuberosity itself which needs to come up a bit. For this reason I'm continuing the wound VAC. Otherwise this is doing remarkably well. The patient states that he is eating everything they give him although I don't see a follow-up albumin. Electronic Signature(s) Signed: 08/17/2018 4:50:13 PM By: Ryan Najjar MD Entered By: [name] on 08/17/2018 16:35:16 [name] ([id number]) -------------------------------------------------------------------------------- Physical Exam Details Patient Name: [name], [name] Date of Service: 08/17/2018 2:45 PM Medical Record Number: [id number] Patient Account Number: [id number] Date of Birth/Sex: [birth year] (62 y.o. M) Treating RN: [name] Primary Care Provider: Sindy [name] Other Clinician: Referring Provider: Sindy [name] Treating Provider/Extender: [name] Weeks in Treatment: 11 Constitutional Sitting or standing Blood Pressure is within  target range for patient.. Pulse regular and within target range for patient.[name] Respirations regular, non-labored and within target range.. Temperature is normal and within the target range for the patient.[name] appears in no distress Although looks somewhat pale. Respiratory Respiratory effort is easy and symmetric bilaterally. Rate is normal at rest and on room air.. Cardiovascular He appears euvolemic. Gastrointestinal (GI) Abdomen is soft and non-distended without masses or tenderness. Bowel sounds active in all quadrants.. No liver or spleen enlargement or tenderness.. Integumentary (Hair, Skin) No systemic skin issues are seen. Psychiatric Somewhat of a flat affect but no overt depression. Notes Wound exam; large wound over the right buttock. Centered about the right ischial tuberosity. Most of the wound around this is granulated to the point of the level above the surrounding skin however the area over the ischial tuberosity is not yet I see nothing but healthy tissue. There is no evidence of surrounding infection no soft tissue issues are seen Electronic Signature(s) Signed: 08/17/2018 4:50:13 PM By: Ryan Najjar MD Entered By: [name] on 08/17/2018 16:36:57 Fencl, Loreli [name] ([id number]) -------------------------------------------------------------------------------- Physician Orders Details Patient Name: [name], [name] Date of Service: 08/17/2018 2:45 PM Medical Record Number: [id number] Patient Account Number: [id number] Date of Birth/Sex: 12-Feb-1956 (62 y.o. M) Treating RN: Rema Jasmine Primary Care Provider: Sindy [name] Other Clinician: Referring Provider: Sindy [name] Treating Provider/Extender: Ryan  in Treatment: 11 Verbal / Phone Orders: No Diagnosis Coding Wound Cleansing Wound #1 Right Gluteus o Clean wound with Normal Saline. o Cleanse wound with mild soap and water Anesthetic (add to Medication List) Wound #1 Right Gluteus o  Topical Lidocaine 4% cream applied to  wound bed prior to debridement (In Clinic Only). Skin Barriers/Peri-Wound Care Wound #1 Right Gluteus o Skin Prep Primary Wound Dressing Wound #1 Right Gluteus o Saline moistened gauze - until wound vac reapplied at facility o Silver Collagen - silver collagen to undermining area above ischial tuberosity under NPWT foam Secondary Dressing Wound #1 Right Gluteus o ABD pad - 3rd layer in clinic only o Dry Gauze - 2nd layer in clinic only o Other - secure with Tegaderm or tape in clinic only Dressing Change Frequency Wound #1 Right Gluteus o Change Dressing Monday, Wednesday, Friday - NPWT Follow-up Appointments Wound #1 Right Gluteus o Return Appointment in 2 weeks. Off-Loading Wound #1 Right Gluteus o Mattress - Air mattress o Turn and reposition every 2 hours Additional Orders / Instructions Wound #1 Right Gluteus o Increase protein intake. o Other: - Pro-stat for wound healing twice daily. HADRIAN, YARBROUGH W. ([id number]) Negative Pressure Wound Therapy Wound #1 Right Gluteus o Wound VAC settings at 125/130 mmHg continuous pressure. Use BLACK/GREEN foam to wound cavity. Use WHITE foam to fill any tunnel/s and/or undermining. Change VAC dressing 3 X WEEK. Change canister as indicated when full. Nurse may titrate settings and frequency of dressing changes as clinically indicated. [facility] to reapply o Home Health Nurse may d/c [facility] for s/s of increased infection, significant wound regression, or uncontrolled drainage. [facility] at [phone]. Electronic Signature(s) Signed: 08/17/2018 4:03:08 PM By: Rema Jasmine Signed: 08/17/2018 4:50:13 PM By: Ryan Najjar MD Entered By: Rema Jasmine on 08/17/2018 15:38:59 Vandervoort, [name] ([id number]) -------------------------------------------------------------------------------- Problem List Details Patient Name: [name], [name] Date of  Service: 08/17/2018 2:45 PM Medical Record Number: [id number] Patient Account Number: [id number] Date of Birth/Sex: 21-Jul-1956 (63 y.o. M) Treating RN: [name] Primary Care Provider: Sindy [name] Other Clinician: Referring Provider: Sindy [name] Treating Provider/Extender: Ryan [place] in Treatment: 11 Active Problems ICD-10 Evaluated Encounter Code Description Active Date Today Diagnosis L89.314 Pressure ulcer of right buttock, stage 4 06/01/2018 No Yes T81.31XD Disruption of external operation (surgical) wound, not 06/01/2018 No Yes elsewhere classified, subsequent encounter E43 Unspecified severe protein-calorie malnutrition 06/01/2018 No Yes Inactive Problems ICD-10 Code Description Active Date Inactive Date M86.18 Other acute osteomyelitis, other site 06/01/2018 06/01/2018 Resolved Problems Electronic Signature(s) Signed: 08/17/2018 4:50:13 PM By: Ryan Najjar MD Entered By: [name] on 08/17/2018 16:33:39 Manzer, Shaman W. ([id number]) -------------------------------------------------------------------------------- Progress Note Details Patient Name: [name] Date of Service: 08/17/2018 2:45 PM Medical Record Number: [id number] Patient Account Number: [id number] Date of Birth/Sex: 1956-07-23 (62 y.o. M) Treating RN: [name] Primary Care Provider: Sindy [name] Other Clinician: Referring Provider: Sindy [name] Treating Provider/Extender: Ryan [place] in Treatment: 11 Subjective History of Present Illness (HPI) ADMISSION 06/01/18 This is a 62 year old man who is sent to [place] currently residing at [facility] skilled facility. He has a complicated recent medical history. His sister accompanies him tells [place] that he has been somewhat disabled since suffering a left basal ganglial CVA in 2018. At that point in time he also had myocardial infarctions. More recently he has been hospitalized from 04/04/18 through 04/12/18 with septic shock,  coronary artery disease, congestive heart failure and an acute CVA. During this hospitalization he was noted to have sacral wounds but these were not felt to be the source of infection however the source of the infection was never really determined at that point. I believe he was sent to a skilled facility. He reports that at the hospital  on 05/09/18 through 05/18/18. At that point he was noted to have a large abscess of the right buttock. He required a surgical IandD by Dr. Gerrit [name] of general surgery. Culture of this grew Citrobacter,Eikenella and Bacteroides. He had a CT scan of the pelvis that showed an abnormal soft tissue collection just caudal to the right ischial tuberosity measuring 5.9 and by 4.7. This also involved the distal right deep muscles. There was some cortical bone loss on the right ischial tuberosity and there was some concern with osteomyelitis. He was felt to require treatment for osteomyelitis based on clinical and radiographic findings. He did not have an MRI. Past medical history includes; abdominal aortic aneurysm, carotid stenosis, hypertension, osteoarthritis, interstitial lung disease on chronic steroids, rheumatoid arthritis, congestive heart failure/ischemic cardiomyopathy with an ejection fraction of 35-40% Also noteworthy is on 05/09/18 his albumin was 2.2 on admission to hospital. I don't think this is actually been repeated. The patient states his appetite is good and he is taking supplements 100%. 06/15/18-He is seen in follow up vibration for right buttock ulcer; this is stable in appearance with minimal amount of non-viable tissue. 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 We've been using a wound VAC. This is centered over the right ischial tuberosity. He is making generally good progress. He was severely hypoalbuminemic at one time  although he states that he eats well and takes his supplements. 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 We have been using silver collagen over the superior aspect of the right ischial tuberosity under wound VAC.he has made some nice improvements. The dimensions of the wound are certainly smaller. He has no exposed bone. We received lab work from [facility]. His prealbumin was 18.6 and albumin at 2.57. This is obviously still low. I am not sure what interventions are in place. I know he is on supplements. I discussed this with the patient. I told him that the albumin represents already moderate to severe protein malnutrition. This would contribute it to do difficulty healing the wound 07/20/18; I received copies of lab work from [facility] on 07/06/18 documenting a sedimentation rate of 25 which seems to be in line with what I see from previous values however his C-reactive protein was 110.8 versus 84.7 on 06/22/18. I have looked through [facility] health Link I don't see a prior value for either inflammatory measure. he has been treated with [name] which I think he is still on for osteomyelitis of the right ischial tuberosity as well as a soft tissue collection just caudad to the right ischial tuberosity itself. Felt to have a soft tissue abscess and/or stigmata of necrotizing fasciitis. I'm not sure who is following this. His CT scan was on 05/09/18 yet he still remains on Invanz. Looking back through the original discharge summary the [name] was supposed to finish on 07/05/18. 08/03/18 the patient continues to make good improvement. The area over the right ischial tuberosity has filled in nicely. His developed some what looks to be candidal skin infection in the periwound. I think they've taken the wound VAC off and are applying nystatin 08/17/18; 2 week follow-up for this resident from [facility] [facility]. They resumed the wound VAC. The area continues to  fill in nicely. The only problem area is the tissue over the ischial tuberosity itself which needs to come up  a bit. For this reason [name], [name] ([id number]) continuing the wound VAC. Otherwise this is doing remarkably well. The patient states that he is eating everything they give him although I don't see a follow-up albumin. Objective Constitutional Sitting or standing Blood Pressure is within target range for patient.. Pulse regular and within target range for patient.[name] Respirations regular, non-labored and within target range.. Temperature is normal and within the target range for the patient.[name] appears in no distress Although looks somewhat pale. Vitals Time Taken: 3:05 PM, Height: 73 in, Weight: 206 lbs, BMI: 27.2, Temperature: 98.3 F, Pulse: 83 bpm, Respiratory Rate: 16 breaths/min, Blood Pressure: 112/69 mmHg. Respiratory Respiratory effort is easy and symmetric bilaterally. Rate is normal at rest and on room air.. Cardiovascular He appears euvolemic. Gastrointestinal (GI) Abdomen is soft and non-distended without masses or tenderness. Bowel sounds active in all quadrants.. No liver or spleen enlargement or tenderness.[name] Psychiatric Somewhat of a flat affect but no overt depression. General Notes: Wound exam; large wound over the right buttock. Centered about the right ischial tuberosity. Most of the wound around this is granulated to the point of the level above the surrounding skin however the area over the ischial tuberosity is not yet I see nothing but healthy tissue. There is no evidence of surrounding infection no soft tissue issues are seen Integumentary (Hair, Skin) No systemic skin issues are seen. Wound #1 status is Open. Original cause of wound was Gradually Appeared. The wound is located on the Right Gluteus. The wound measures 7.5cm length x 4.4cm width x 1.9cm depth; 25.918cm^2 area and 49.244cm^3 volume. There is bone, muscle, Fat Layer (Subcutaneous  Tissue) Exposed, and fascia exposed. There is no tunneling or undermining noted. There is a large amount of serosanguineous drainage noted. The wound margin is distinct with the outline attached to the wound base. There is large (67-100%) red, pink, hyper - granulation within the wound bed. There is a small (1-33%) amount of necrotic tissue within the wound bed including Adherent Slough. The periwound skin appearance exhibited: Scarring. The periwound skin appearance did not exhibit: Callus, Crepitus, Excoriation, Induration, Rash, Dry/Scaly, Maceration, Atrophie Blanche, Cyanosis, Ecchymosis, Hemosiderin Staining, Mottled, Pallor, Rubor, Erythema. Periwound temperature was noted as No Abnormality. The periwound has tenderness on palpation. [name], [name] ([id number]) Assessment Active Problems ICD-10 Pressure ulcer of right buttock, stage 4 Disruption of external operation (surgical) wound, not elsewhere classified, subsequent encounter Unspecified severe protein-calorie malnutrition Plan Wound Cleansing: Wound #1 Right Gluteus: Clean wound with Normal Saline. Cleanse wound with mild soap and water Anesthetic (add to Medication List): Wound #1 Right Gluteus: Topical Lidocaine 4% cream applied to wound bed prior to debridement (In Clinic Only). Skin Barriers/Peri-Wound Care: Wound #1 Right Gluteus: Skin Prep Primary Wound Dressing: Wound #1 Right Gluteus: Saline moistened gauze - until wound vac reapplied at facility Silver Collagen - silver collagen to undermining area above ischial tuberosity under NPWT foam Secondary Dressing: Wound #1 Right Gluteus: ABD pad - 3rd layer in clinic only Dry Gauze - 2nd layer in clinic only Other - secure with Tegaderm or tape in clinic only Dressing Change Frequency: Wound #1 Right Gluteus: Change Dressing Monday, Wednesday, Friday - NPWT Follow-up Appointments: Wound #1 Right Gluteus: Return Appointment in 2 weeks. Off-Loading: Wound #1  Right Gluteus: Mattress - Air mattress Turn and reposition every 2 hours Additional Orders / Instructions: Wound #1 Right Gluteus: Increase protein intake. Other: - Pro-stat for wound healing twice daily. Negative Pressure Wound Therapy: Wound #1 Right  Gluteus: Wound VAC settings at 125/130 mmHg continuous pressure. Use BLACK/GREEN foam to wound cavity. Use WHITE foam to fill any tunnel/s and/or undermining. Change VAC dressing 3 X WEEK. Change canister as indicated when full. Nurse may titrate settings and frequency of dressing changes as clinically indicated. [facility] to reapply Home Health Nurse may d/c [facility] for s/s of increased infection, significant wound regression, or uncontrolled drainage. [facility] at [phone]. [name], [name] ([id number]) #1Right gluteus. We continue with the wound VAC with collagen over the ischial tuberosity. #2 patient looks pale and I haven't seen a follow-up albumin on him. We may need to get follow-up lab work. Other than this there is continued improvement in what once was a deep stage IV wound with underlying osteomyelitis Electronic Signature(s) Signed: 08/17/2018 4:50:13 PM By: Ryan Najjar MD Entered By: [name] on 08/17/2018 16:38:38 Tyner, Loreli [name] ([id number]) -------------------------------------------------------------------------------- SuperBill Details Patient Name: [name] Date of Service: 08/17/2018 Medical Record Number: [id number] Patient Account Number: [id number] Date of Birth/Sex: 09/06/1956 (62 y.o. M) Treating RN: [name] Primary Care Provider: Sindy [name] Other Clinician: Referring Provider: Sindy [name] Treating Provider/Extender: Ryan [place] in Treatment: 11 Diagnosis Coding ICD-10 Codes Code Description L89.314 Pressure ulcer of right buttock, stage 4 T81.31XD Disruption of external operation (surgical) wound, not elsewhere classified, subsequent  encounter E43 Unspecified severe protein-calorie malnutrition Physician Procedures CPT4: Description Modifier Quantity Code [id number] - WC PHYS LEVEL 3 - EST PT 1 ICD-10 Diagnosis Description L89.314 Pressure ulcer of right buttock, stage 4 T81.31XD Disruption of external operation (surgical) wound, not elsewhere classified,  subsequent encounter Electronic Signature(s) Signed: 08/17/2018 4:50:13 PM By: Ryan Najjar MD Entered By: [name] on 08/17/2018 16:39:03

## 2018-08-31 ENCOUNTER — Encounter: Payer: Medicaid Other | Admitting: Internal Medicine

## 2018-08-31 DIAGNOSIS — L89314 Pressure ulcer of right buttock, stage 4: Secondary | ICD-10-CM | POA: Diagnosis not present

## 2018-09-03 NOTE — Progress Notes (Signed)
NORTH, ESTERLINE ([id number]) Visit Report for 08/31/2018 Arrival Information Details Patient Name: [name], [name] Date of Service: 08/31/2018 2:45 PM Medical Record Number: [id number] Patient Account Number: [id number] Date of Birth/Sex: 1956-05-10 (62 y.o. M) Treating RN: Rema Jasmine Primary Care Inell Mimbs: Sindy Messing Other Clinician: Referring Synthia Fairbank: Sindy Messing Treating Mikah Poss/Extender: Altamese [place] in Treatment: 13 Visit Information History Since Last Visit Added or deleted any medications: No Patient Arrived: Stretcher Any new allergies or adverse reactions: No Arrival Time: 14:51 Had a fall or experienced change in No Accompanied By: sister activities of daily living that may affect Transfer Assistance: Other risk of falls: Patient Identification Verified: Yes Signs or symptoms of abuse/neglect since last visito No Secondary Verification Process Completed: Yes Hospitalized since last visit: No Patient Requires Transmission-Based No Implantable device outside of the clinic excluding No Precautions: cellular tissue based products placed in the center Patient Has Alerts: No since last visit: Has Dressing in Place as Prescribed: Yes Pain Present Now: No Electronic Signature(s) Signed: 08/31/2018 3:57:54 PM By: Rema Jasmine Entered By: Rema Jasmine on 08/31/2018 14:52:30 [name], [name] ([id number]) -------------------------------------------------------------------------------- Clinic Level of Care Assessment Details Patient Name: [name] Date of Service: 08/31/2018 2:45 PM Medical Record Number: [id number] Patient Account Number: [id number] Date of Birth/Sex: [birth year] (62 y.o. M) Treating RN: Huel Coventry Primary Care Lenaya Pietsch: Sindy Messing Other Clinician: Referring Evangelyne Loja: Sindy Messing Treating Zakari Couchman/Extender: Altamese [place] in Treatment: 13 Clinic Level of Care Assessment Items TOOL 4 Quantity Score []  - Use when only an  EandM is performed on FOLLOW-UP visit 0 ASSESSMENTS - Nursing Assessment / Reassessment []  - Reassessment of Co-morbidities (includes updates in patient status) 0 X- 1 5 Reassessment of Adherence to Treatment Plan ASSESSMENTS - Wound and Skin Assessment / Reassessment X - Simple Wound Assessment / Reassessment - one wound 1 5 []  - 0 Complex Wound Assessment / Reassessment - multiple wounds []  - 0 Dermatologic / Skin Assessment (not related to wound area) ASSESSMENTS - Focused Assessment []  - Circumferential Edema Measurements - multi extremities 0 []  - 0 Nutritional Assessment / Counseling / Intervention []  - 0 Lower Extremity Assessment (monofilament, tuning fork, pulses) []  - 0 Peripheral Arterial Disease Assessment (using hand held doppler) ASSESSMENTS - Ostomy and/or Continence Assessment and Care []  - Incontinence Assessment and Management 0 []  - 0 Ostomy Care Assessment and Management (repouching, etc.) PROCESS - Coordination of Care X - Simple Patient / Family Education for ongoing care 1 15 []  - 0 Complex (extensive) Patient / Family Education for ongoing care X- 1 10 Staff obtains , Records, Test Results / Process Orders []  - 0 Staff telephones HHA, Nursing Homes / Clarify orders / etc []  - 0 Routine Transfer to another Facility (non-emergent condition) []  - 0 Routine Hospital Admission (non-emergent condition) []  - 0 New Admissions / / Ordering NPWT, Apligraf, etc. []  - 0 Emergency Hospital Admission (emergent condition) X- 1 10 Simple Discharge Coordination MANKINS, Donivan W. ( ) []  - 0 Complex (extensive) Discharge Coordination PROCESS - Special Needs []  - Pediatric / Minor Patient Management 0 []  - 0 Isolation Patient Management []  - 0 Hearing / Language / Visual special needs []  - 0 Assessment of Community assistance (transportation, D/C planning, etc.) []  - 0 Additional assistance / Altered mentation []  -  0 Support Surface(s) Assessment (bed, cushion, seat, etc.) INTERVENTIONS - Wound Cleansing / Measurement X - Simple Wound Cleansing - one wound 1 5 []  - 0 Complex Wound Cleansing -  multiple wounds X- 1 5 Wound Imaging (photographs - any number of wounds) []  - 0 Wound Tracing (instead of photographs) X- 1 5 Simple Wound Measurement - one wound []  - 0 Complex Wound Measurement - multiple wounds INTERVENTIONS - Wound Dressings []  - Small Wound Dressing one or multiple wounds 0 X- 1 15 Medium Wound Dressing one or multiple wounds []  - 0 Large Wound Dressing one or multiple wounds []  - 0 Application of Medications - topical []  - 0 Application of Medications - injection INTERVENTIONS - Miscellaneous []  - External ear exam 0 []  - 0 Specimen Collection (cultures, biopsies, blood, body fluids, etc.) []  - 0 Specimen(s) / Culture(s) sent or taken to Lab for analysis []  - 0 Patient Transfer (multiple staff / / Similar devices) []  - 0 Simple Staple / Suture removal (25 or less) []  - 0 Complex Staple / Suture removal (26 or more) []  - 0 Hypo / Hyperglycemic Management (close monitor of Blood Glucose) []  - 0 Ankle / Brachial Index (ABI) - do not check if billed separately X- 1 5 Vital Signs [name], [name] ( ) Has the patient been seen at the hospital within the last three years: Yes Total Score: 80 Level Of Care: New/Established - Level 3 Electronic Signature(s) Signed: 09/01/2018 7:26:08 AM By: , BSN, RN, CWS, Kim RN, BSN Entered By: , BSN, RN, CWS, Kim on 08/31/2018 15:22:21 ( ) -------------------------------------------------------------------------------- Encounter Discharge Information Details Patient Name: [name], [name] Date of Service: 08/31/2018 2:45 PM Medical Record Number: Nurse, adult Patient Account Number: Date of Birth/Sex: Oct 01, 1956 (62 y.o. M) Treating RN: Primary Care  Zeanna Sunde: [id number] Other Clinician: Referring Journee Bobrowski: 09/03/2018 Treating Hattye Siegfried/Extender: Elliot Gurney in Treatment: 13 Encounter Discharge Information Items Discharge Condition: Stable Ambulatory Status: Stretcher Discharge Destination: Skilled Nursing Facility Telephoned: No Orders Sent: Yes Transportation: Ambulance Accompanied By: EMS Schedule Follow-up Appointment: Yes Clinical Summary of Care: Electronic Signature(s) Signed: 08/31/2018 4:51:54 PM By: 09/02/2018 Entered By: [name] on 08/31/2018 16:51:53 [name], [name] ([name]) -------------------------------------------------------------------------------- Lower Extremity Assessment Details Patient Name: 09/02/2018. Date of Service: 08/31/2018 2:45 PM Medical Record Number: [id number] Patient Account Number: 10/20/1956 Date of Birth/Sex: 08-07-1956 (62 y.o. M) Treating RN: Sindy Messing Primary Care Tallyn Holroyd: Sindy Messing Other Clinician: Referring Edmund Holcomb: Altamese [place] Treating Star Resler/Extender: 09/02/2018 Weeks in Treatment: 13 Electronic Signature(s) Signed: 08/31/2018 3:57:54 PM By: Curtis Sites Entered By: 09/02/2018 on 08/31/2018 14:53:55 [name], [name] ([name]) -------------------------------------------------------------------------------- Multi Wound Chart Details Patient Name: [name], [name] Date of Service: 08/31/2018 2:45 PM Medical Record Number: 10/20/1956 Patient Account Number: ([id number] Date of Birth/Sex: 26-Apr-1956 (62 y.o. M) Treating RN: Sindy Messing Primary Care Arthuro Canelo: Maxwell Caul Other Clinician: Referring Nyhla Mountjoy: 09/02/2018 Treating Chamar Broughton/Extender: Rema Jasmine in Treatment: 13 Vital Signs Height(in): 73 Pulse(bpm): 89 Weight(lbs): 206 Blood Pressure(mmHg): 99/63 Body Mass Index(BMI): 27 Temperature(F): 98.4 Respiratory Rate 16 (breaths/min): Photos: [N/A:N/A] Wound Location: Right Gluteus N/A N/A Wounding  Event: Gradually Appeared N/A N/A Primary Etiology: Pressure Ulcer N/A N/A Comorbid History: Cataracts, Congestive Heart N/A N/A Failure, Coronary Artery Disease, Hypertension, Myocardial Infarction, History of pressure wounds, Rheumatoid Arthritis Date Acquired: 05/09/2018 N/A N/A Weeks of Treatment: 13 N/A N/A Wound Status: Open N/A N/A Measurements L x W x D 5.8x4x1.6 N/A N/A (cm) Area (cm) : 18.221 N/A N/A Volume (cm) : 29.154 N/A N/A % Reduction in Area: 85.20% N/A N/A % Reduction in Volume: 94.60% N/A N/A Classification: Category/Stage  IV N/A N/A Exudate Amount: Large N/A N/A Exudate Type: Serosanguineous N/A N/A Exudate Color: red, brown N/A N/A Wound Margin: Distinct, outline attached N/A N/A Granulation Amount: Large (67-100%) N/A N/A Granulation Quality: Red, Pink, Hyper-granulation N/A N/A Necrotic Amount: Small (1-33%) N/A N/A Exposed Structures: Fascia: Yes N/A N/A Fat Layer (Subcutaneous Tissue) Exposed: Yes Bromell, Nazario W. ([id number]) Muscle: Yes Bone: Yes Epithelialization: None N/A N/A Periwound Skin Texture: Scarring: Yes N/A N/A Excoriation: No Induration: No Callus: No Crepitus: No Rash: No Periwound Skin Moisture: Maceration: No N/A N/A Dry/Scaly: No Periwound Skin Color: Atrophie Blanche: No N/A N/A Cyanosis: No Ecchymosis: No Erythema: No Hemosiderin Staining: No Mottled: No Pallor: No Rubor: No Temperature: No Abnormality N/A N/A Tenderness on Palpation: Yes N/A N/A Wound Preparation: Ulcer Cleansing: N/A N/A Rinsed/Irrigated with Saline Topical Anesthetic Applied: Other: lidocaine 4% Treatment Notes Wound #1 (Right Gluteus) Notes prisma, saline moistened gauze, ABD secured with tape until back in facility. Facility to apply NPWT. Electronic Signature(s) Signed: 08/31/2018 5:32:05 PM By: Baltazar Najjar MD Entered By: Baltazar Najjar on 08/31/2018 17:20:00 [name]  ([id number]) -------------------------------------------------------------------------------- Multi-Disciplinary Care Plan Details Patient Name: [name], [name] Date of Service: 08/31/2018 2:45 PM Medical Record Number: [id number] Patient Account Number: [id number] Date of Birth/Sex: Dec 08, 1955 (62 y.o. M) Treating RN: Huel Coventry Primary Care Solara Goodchild: Sindy Messing Other Clinician: Referring Maybel Dambrosio: Sindy Messing Treating Lavern Maslow/Extender: Altamese [place] in Treatment: 13 Active Inactive ` Necrotic Tissue Nursing Diagnoses: Knowledge deficit related to management of necrotic/devitalized tissue Goals: Necrotic/devitalized tissue will be minimized in the wound bed Date Initiated: 06/15/2018 Target Resolution Date: 06/29/2018 Goal Status: Active Interventions: Assess patient pain level pre-, during and post procedure and prior to discharge Treatment Activities: Apply topical anesthetic as ordered : 06/15/2018 Excisional debridement : 06/15/2018 Notes: ` Orientation to the Wound Care Program Nursing Diagnoses: Knowledge deficit related to the wound healing center program Goals: Patient/caregiver will verbalize understanding of the Wound Healing Center Program Date Initiated: 06/15/2018 Target Resolution Date: 07/01/2018 Goal Status: Active Interventions: Provide education on orientation to the wound center Notes: ` Pressure Nursing Diagnoses: Knowledge deficit related to management of pressures ulcers Goals: Patient will remain free from development of additional pressure ulcers [name], [name] ([id number]) Date Initiated: 06/15/2018 Target Resolution Date: 07/01/2018 Goal Status: Active Interventions: Assess: immobility, friction, shearing, incontinence upon admission and as needed Notes: ` Wound/Skin Impairment Nursing Diagnoses: Impaired tissue integrity Goals: Ulcer/skin breakdown will have a volume reduction of 80% by week 12 Date Initiated:  06/15/2018 Target Resolution Date: 09/15/2018 Goal Status: Active Interventions: Assess ulceration(s) every visit Treatment Activities: Skin care regimen initiated : 06/15/2018 Notes: Electronic Signature(s) Signed: 09/01/2018 7:26:08 AM By: Elliot Gurney, BSN, RN, CWS, Kim RN, BSN Entered By: Elliot Gurney, BSN, RN, CWS, Kim on 08/31/2018 15:20:40 [name] ([id number]) -------------------------------------------------------------------------------- Pain Assessment Details Patient Name: [name], [name] Date of Service: 08/31/2018 2:45 PM Medical Record Number: [id number] Patient Account Number: [id number] Date of Birth/Sex: [birth year] (62 y.o. M) Treating RN: Rema Jasmine Primary Care Wise Fees: Sindy Messing Other Clinician: Referring Ersie Savino: Sindy Messing Treating Olisa Quesnel/Extender: Altamese [place] in Treatment: 13 Active Problems Location of Pain Severity and Description of Pain Patient Has Paino No Site Locations Pain Management and Medication Current Pain Management: Goals for Pain Management pt denies any pain at this time. Electronic Signature(s) Signed: 08/31/2018 3:57:54 PM By: Rema Jasmine Entered By: Rema Jasmine on 08/31/2018 14:52:49 Emminger, [name] ([id number]) -------------------------------------------------------------------------------- Patient/Caregiver Education Details Patient Name: [name] Date of Service: 08/31/2018  2:45 PM Medical Record Number: [id number] Patient Account Number: [id number] Date of Birth/Gender: December 04, 1955 (62 y.o. M) Treating RN: Huel Coventry Primary Care Physician: Sindy Messing Other Clinician: Referring Physician: Sindy Messing Treating Physician/Extender: Altamese [place] in Treatment: 13 Education Assessment Education Provided To: Patient Education Topics Provided Wound/Skin Impairment: Handouts: Caring for Your Ulcer Methods: Demonstration, Explain/Verbal Responses: State content correctly Electronic  Signature(s) Signed: 09/01/2018 7:26:08 AM By: Elliot Gurney, BSN, RN, CWS, Kim RN, BSN Entered By: Elliot Gurney, BSN, RN, CWS, Kim on 08/31/2018 15:22:52 [name] ([id number]) -------------------------------------------------------------------------------- Wound Assessment Details Patient Name: [name], SPROUL. Date of Service: 08/31/2018 2:45 PM Medical Record Number: [id number] Patient Account Number: [id number] Date of Birth/Sex: 1956-07-13 (62 y.o. M) Treating RN: Rema Jasmine Primary Care Konner Warrior: Sindy Messing Other Clinician: Referring Paquita Printy: Sindy Messing Treating Jarreau Callanan/Extender: Altamese [place] in Treatment: 13 Wound Status Wound Number: 1 Primary Pressure Ulcer Etiology: Wound Location: Right Gluteus Wound Open Wounding Event: Gradually Appeared Status: Date Acquired: 05/09/2018 Comorbid Cataracts, Congestive Heart Failure, Coronary Weeks Of Treatment: 13 History: Artery Disease, Hypertension, Myocardial Clustered Wound: No Infarction, History of pressure wounds, Rheumatoid Arthritis Photos Photo Uploaded By: Rema Jasmine on 08/31/2018 15:48:32 Wound Measurements Length: (cm) 5.8 Width: (cm) 4 Depth: (cm) 1.6 Area: (cm) 18.221 Volume: (cm) 29.154 % Reduction in Area: 85.2% % Reduction in Volume: 94.6% Epithelialization: None Tunneling: No Undermining: No Wound Description Classification: Category/Stage IV Foul Odo Wound Margin: Distinct, outline attached Slough/F Exudate Amount: Large Exudate Type: Serosanguineous Exudate Color: red, brown r After Cleansing: No ibrino Yes Wound Bed Granulation Amount: Large (67-100%) Exposed Structure Granulation Quality: Red, Pink, Hyper-granulation Fascia Exposed: Yes Necrotic Amount: Small (1-33%) Fat Layer (Subcutaneous Tissue) Exposed: Yes Necrotic Quality: Adherent Slough Muscle Exposed: Yes Necrosis of Muscle: No Bone Exposed: Yes Periwound Skin Texture Marsteller, Kymani W. ([id number]) Texture Color No  Abnormalities Noted: No No Abnormalities Noted: No Callus: No Atrophie Blanche: No Crepitus: No Cyanosis: No Excoriation: No Ecchymosis: No Induration: No Erythema: No Rash: No Hemosiderin Staining: No Scarring: Yes Mottled: No Pallor: No Moisture Rubor: No No Abnormalities Noted: No Dry / Scaly: No Temperature / Pain Maceration: No Temperature: No Abnormality Tenderness on Palpation: Yes Wound Preparation Ulcer Cleansing: Rinsed/Irrigated with Saline Topical Anesthetic Applied: Other: lidocaine 4%, Treatment Notes Wound #1 (Right Gluteus) Notes prisma, saline moistened gauze, ABD secured with tape until back in facility. Facility to apply NPWT. Electronic Signature(s) Signed: 08/31/2018 3:57:54 PM By: Rema Jasmine Entered By: Rema Jasmine on 08/31/2018 14:59:45 Siverling, Avian Lacretia Nicks ([id number]) -------------------------------------------------------------------------------- Vitals Details Patient Name: FORESTIER, [name]. Date of Service: 08/31/2018 2:45 PM Medical Record Number: [id number] Patient Account Number: [id number] Date of Birth/Sex: 1956-04-26 (62 y.o. M) Treating RN: Rema Jasmine Primary Care Pasha Gadison: Sindy Messing Other Clinician: Referring Evalyn Shultis: Sindy Messing Treating Athony Coppa/Extender: Altamese [place] in Treatment: 13 Vital Signs Time Taken: 14:52 Temperature (F): 98.4 Height (in): 73 Pulse (bpm): 89 Weight (lbs): 206 Respiratory Rate (breaths/min): 16 Body Mass Index (BMI): 27.2 Blood Pressure (mmHg): 99/63 Reference Range: 80 - 120 mg / dl Electronic Signature(s) Signed: 08/31/2018 3:57:54 PM By: Rema Jasmine Entered By: Rema Jasmine on 08/31/2018 14:53:46

## 2018-09-03 NOTE — Progress Notes (Signed)
KETHAN, PAPADOPOULOS ([id number]) Visit Report for 08/31/2018 HPI Details Patient Name: [name], [name] Date of Service: 08/31/2018 2:45 PM Medical Record Number: [id number] Patient Account Number: [id number] Date of Birth/Sex: 06/29/1956 (62 y.o. M) Treating RN: Huel Coventry Primary Care Provider: Sindy Messing Other Clinician: Referring Provider: Sindy Messing Treating Provider/Extender: Altamese [place] in Treatment: 13 History of Present Illness HPI Description: ADMISSION 06/01/18 This is a 62 year old man who is sent to [place] currently residing at [facility] skilled facility. He has a complicated recent medical history. His sister accompanies him tells [place] that he has been somewhat disabled since suffering a left basal ganglial CVA in 2018. At that point in time he also had myocardial infarctions. More recently he has been hospitalized from 04/04/18 through 04/12/18 with septic shock, coronary artery disease, congestive heart failure and an acute CVA. During this hospitalization he was noted to have sacral wounds but these were not felt to be the source of infection however the source of the infection was never really determined at that point. I believe he was sent to a skilled facility. He reports that at the hospital on 05/09/18 through 05/18/18. At that point he was noted to have a large abscess of the right buttock. He required a surgical IandD by Dr. Gerrit Friends of general surgery. Culture of this grew Citrobacter,Eikenella and Bacteroides. He had a CT scan of the pelvis that showed an abnormal soft tissue collection just caudal to the right ischial tuberosity measuring 5.9 and by 4.7. This also involved the distal right deep muscles. There was some cortical bone loss on the right ischial tuberosity and there was some concern with osteomyelitis. He was felt to require treatment for osteomyelitis based on clinical and radiographic findings. He did not have an MRI. Past medical history includes;  abdominal aortic aneurysm, carotid stenosis, hypertension, osteoarthritis, interstitial lung disease on chronic steroids, rheumatoid arthritis, congestive heart failure/ischemic cardiomyopathy with an ejection fraction of 35-40% Also noteworthy is on 05/09/18 his albumin was 2.2 on admission to hospital. I don't think this is actually been repeated. The patient states his appetite is good and he is taking supplements 100%. 06/15/18-He is seen in follow up vibration for right buttock ulcer; this is stable in appearance with minimal amount of non-viable tissue. 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 We've been using a wound VAC. This is centered over the right ischial tuberosity. He is making generally good progress. He was severely hypoalbuminemic at one time although he states that he eats well and takes his supplements. 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 We have been using silver collagen over the superior aspect of the right ischial tuberosity under wound VAC.he has made some nice improvements. The dimensions of the wound are certainly smaller. He has no exposed bone. We received lab work from [facility]. His prealbumin was 18.6 and albumin at 2.57. This is obviously still low. I am not sure what interventions are in place. I know he is on supplements. I discussed this with the patient. I told him that the albumin represents already moderate to severe protein malnutrition. This would contribute it to do difficulty healing the wound 07/20/18; I received copies of lab work from [facility] on 07/06/18 documenting a sedimentation rate of 25 which seems to be in line with what I see  from previous values however his C-reactive protein was 110.8 versus 84.7 on 06/22/18. I have looked through [facility] health Link I  don't see a prior value for either inflammatory measure. he has been treated with [name] which I think he is still on for osteomyelitis of the right ischial tuberosity as well as a soft tissue collection just caudad to the right ischial tuberosity itself. Felt to have a soft tissue abscess and/or stigmata of necrotizing fasciitis. I'm not sure who is following this. His CT scan was on 05/09/18 yet he still remains on Invanz. Looking back through the original discharge summary the [name] was supposed to finish on 07/05/18. 08/03/18 the patient continues to make good improvement. The area over the right ischial tuberosity has filled in nicely. His developed some what looks to be candidal skin infection in the periwound. I think they've taken the wound VAC off and are applying nystatin STELLAN, VICK ([id number]) 08/17/18; 2 week follow-up for this resident from [facility] [facility]. They resumed the wound VAC. The area continues to fill in nicely. The only problem area is the tissue over the ischial tuberosity itself which needs to come up a bit. For this reason I'm continuing the wound VAC. Otherwise this is doing remarkably well. The patient states that he is eating everything they give him although I don't see a follow-up albumin. 08/31/18; the patient states he had blood work done today. I do not have these results. He claims to be eating well. He continues with a wound VAC and the wound is making nice progress. This was a 1 time a deep stage IV wound over the right ischial tuberosity with accompanied osteomyelitis Electronic Signature(s) Signed: 08/31/2018 5:32:05 PM By: Baltazar Najjar MD Entered By: Baltazar Najjar on 08/31/2018 17:20:54 Haefner, [name] ([id number]) -------------------------------------------------------------------------------- Physical Exam Details Patient Name: [name] Date of Service: 08/31/2018 2:45 PM Medical Record Number: [id number] Patient Account Number:  [id number] Date of Birth/Sex: 1955/12/06 (62 y.o. M) Treating RN: Huel Coventry Primary Care Provider: Sindy Messing Other Clinician: Referring Provider: Sindy Messing Treating Provider/Extender: Altamese [place] in Treatment: 13 Constitutional Patient is hypotensive.He does not appear to be systemically unwell. Pulse regular and within target range for patient.[name] Respirations regular, non-labored and within target range.. Temperature is normal and within the target range for the patient.[name] appears in no distress. Somewhat pale but he doesn't look any different.[name] Respiratory Respiratory effort is easy and symmetric bilaterally. Rate is normal at rest and on room air.. Cardiovascular He appears to be well hydrated. Gastrointestinal (GI) Abdomen is soft and non-distended without masses or tenderness. Bowel sounds active in all quadrants.. Integumentary (Hair, Skin) No primary skin issues are seen. Psychiatric Usual somewhat flat affect but he does not appear to be depressed. Notes Would exam; the wound over the right buttock is coming in nicely. There is no exposed bone healthy granulation. There is still some depth to the middle of the wound which I think is justification for continuing the wound VAC. There is no evidence of surrounding infection Electronic Signature(s) Signed: 08/31/2018 5:32:05 PM By: Baltazar Najjar MD Entered By: Baltazar Najjar on 08/31/2018 17:22:46 Alioto, [name] ([id number]) -------------------------------------------------------------------------------- Physician Orders Details Patient Name: BLADE, SCHEFF. Date of Service: 08/31/2018 2:45 PM Medical Record Number: [id number] Patient Account Number: [id number] Date of Birth/Sex: 1956/10/19 (62 y.o. M) Treating RN: Huel Coventry Primary Care Provider: Sindy Messing Other Clinician: Referring Provider: Sindy Messing Treating Provider/Extender: Altamese  in Treatment: 17 Verbal / Phone  Orders:  No Diagnosis Coding Wound Cleansing Wound #1 Right Gluteus o Clean wound with Normal Saline. o Cleanse wound with mild soap and water Anesthetic (add to Medication List) Wound #1 Right Gluteus o Topical Lidocaine 4% cream applied to wound bed prior to debridement (In Clinic Only). Skin Barriers/Peri-Wound Care Wound #1 Right Gluteus o Skin Prep Primary Wound Dressing Wound #1 Right Gluteus o Saline moistened gauze - until wound vac reapplied at facility o Silver Collagen - silver collagen to undermining area above ischial tuberosity under NPWT foam Secondary Dressing Wound #1 Right Gluteus o ABD pad - 3rd layer in clinic only o Dry Gauze - 2nd layer in clinic only o Other - secure with Tegaderm or tape in clinic only Dressing Change Frequency Wound #1 Right Gluteus o Change Dressing Monday, Wednesday, Friday - NPWT Follow-up Appointments Wound #1 Right Gluteus o Return Appointment in 2 weeks. Off-Loading Wound #1 Right Gluteus o Mattress - Air mattress o Turn and reposition every 2 hours Additional Orders / Instructions Wound #1 Right Gluteus o Increase protein intake. o Other: - Pro-stat for wound healing twice daily. DESHUN, SEDIVY W. ([id number]) Negative Pressure Wound Therapy Wound #1 Right Gluteus o Wound VAC settings at 125/130 mmHg continuous pressure. Use BLACK/GREEN foam to wound cavity. Use WHITE foam to fill any tunnel/s and/or undermining. Change VAC dressing 3 X WEEK. Change canister as indicated when full. Nurse may titrate settings and frequency of dressing changes as clinically indicated. [facility] to reapply o Home Health Nurse may d/c [facility] for s/s of increased infection, significant wound regression, or uncontrolled drainage. Not[facility] at [phone]. Electronic Signature(s) Signed: 08/31/2018 5:32:05 PM By: Baltazar Najjar MD Signed: 09/01/2018 7:26:08 AM By: Elliot Gurney, BSN, RN, CWS, Kim  RN, BSN Entered By: Elliot Gurney, BSN, RN, CWS, Kim on 08/31/2018 15:21:45 JOVANY, DISANO ([id number]) -------------------------------------------------------------------------------- Problem List Details Patient Name: BRANNDON, TUITE. Date of Service: 08/31/2018 2:45 PM Medical Record Number: [id number] Patient Account Number: [id number] Date of Birth/Sex: [birth year] (62 y.o. M) Treating RN: Huel Coventry Primary Care Provider: Sindy Messing Other Clinician: Referring Provider: Sindy Messing Treating Provider/Extender: Altamese [place] in Treatment: 13 Active Problems ICD-10 Evaluated Encounter Code Description Active Date Today Diagnosis L89.314 Pressure ulcer of right buttock, stage 4 06/01/2018 No Yes T81.31XD Disruption of external operation (surgical) wound, not 06/01/2018 No Yes elsewhere classified, subsequent encounter E43 Unspecified severe protein-calorie malnutrition 06/01/2018 No Yes Inactive Problems ICD-10 Code Description Active Date Inactive Date M86.18 Other acute osteomyelitis, other site 06/01/2018 06/01/2018 Resolved Problems Electronic Signature(s) Signed: 08/31/2018 5:32:05 PM By: Baltazar Najjar MD Entered By: Baltazar Najjar on 08/31/2018 17:19:46 Zern, Malakie W. ([id number]) -------------------------------------------------------------------------------- Progress Note Details Patient Name: [name]. Date of Service: 08/31/2018 2:45 PM Medical Record Number: [id number] Patient Account Number: [id number] Date of Birth/Sex: Aug 22, 1956 (62 y.o. M) Treating RN: Huel Coventry Primary Care Provider: Sindy Messing Other Clinician: Referring Provider: Sindy Messing Treating Provider/Extender: Altamese [place] in Treatment: 13 Subjective History of Present Illness (HPI) ADMISSION 06/01/18 This is a 62 year old man who is sent to [place] currently residing at [facility] skilled facility. He has a complicated recent medical history. His sister accompanies  him tells [place] that he has been somewhat disabled since suffering a left basal ganglial CVA in 2018. At that point in time he also had myocardial infarctions. More recently he has been hospitalized from 04/04/18 through 04/12/18 with septic shock, coronary artery disease, congestive heart failure and an acute CVA.  During this hospitalization he was noted to have sacral wounds but these were not felt to be the source of infection however the source of the infection was never really determined at that point. I believe he was sent to a skilled facility. He reports that at the hospital on 05/09/18 through 05/18/18. At that point he was noted to have a large abscess of the right buttock. He required a surgical IandD by Dr. Gerrit Friends of general surgery. Culture of this grew Citrobacter,Eikenella and Bacteroides. He had a CT scan of the pelvis that showed an abnormal soft tissue collection just caudal to the right ischial tuberosity measuring 5.9 and by 4.7. This also involved the distal right deep muscles. There was some cortical bone loss on the right ischial tuberosity and there was some concern with osteomyelitis. He was felt to require treatment for osteomyelitis based on clinical and radiographic findings. He did not have an MRI. Past medical history includes; abdominal aortic aneurysm, carotid stenosis, hypertension, osteoarthritis, interstitial lung disease on chronic steroids, rheumatoid arthritis, congestive heart failure/ischemic cardiomyopathy with an ejection fraction of 35-40% Also noteworthy is on 05/09/18 his albumin was 2.2 on admission to hospital. I don't think this is actually been repeated. The patient states his appetite is good and he is taking supplements 100%. 06/15/18-He is seen in follow up vibration for right buttock ulcer; this is stable in appearance with minimal amount of non-viable tissue. 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 We've been using a wound VAC. This is centered over the right ischial tuberosity. He is making generally good progress. He was severely hypoalbuminemic at one time although he states that he eats well and takes his supplements. 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 We have been using silver collagen over the superior aspect of the right ischial tuberosity under wound VAC.he has made some nice improvements. The dimensions of the wound are certainly smaller. He has no exposed bone. We received lab work from [facility]. His prealbumin was 18.6 and albumin at 2.57. This is obviously still low. I am not sure what interventions are in place. I know he is on supplements. I discussed this with the patient. I told him that the albumin represents already moderate to severe protein malnutrition. This would contribute it to do difficulty healing the wound 07/20/18; I received copies of lab work from [facility] on 07/06/18 documenting a sedimentation rate of 25 which seems to be in line with what I see from previous values however his C-reactive protein was 110.8 versus 84.7 on 06/22/18. I have looked through [facility] health Link I don't see a prior value for either inflammatory measure. he has been treated with [name] which I think he is still on for osteomyelitis of the right ischial tuberosity as well as a soft tissue collection just caudad to the right ischial tuberosity itself. Felt to have a soft tissue abscess and/or stigmata of necrotizing fasciitis. I'm not sure who is following this. His CT scan was on 05/09/18 yet he still remains on Invanz. Looking back through the original discharge summary the [name] was supposed to finish on 07/05/18. 08/03/18 the patient continues to make good improvement. The area over the right ischial tuberosity has filled in nicely. His developed some  what looks to be candidal skin infection in the periwound. I think  they've taken the wound VAC off and are applying nystatin 08/17/18; 2 week follow-up for this resident from [place] [facility]. They resumed the wound VAC. The area continues to fill in nicely. The only problem area is the tissue over the ischial tuberosity itself which needs to come up a bit. For this reason Andro Grosser, Oshay W. ([id number]) continuing the wound VAC. Otherwise this is doing remarkably well. The patient states that he is eating everything they give him although I don't see a follow-up albumin. 08/31/18; the patient states he had blood work done today. I do not have these results. He claims to be eating well. He continues with a wound VAC and the wound is making nice progress. This was a 1 time a deep stage IV wound over the right ischial tuberosity with accompanied osteomyelitis Objective Constitutional Patient is hypotensive.He does not appear to be systemically unwell. Pulse regular and within target range for patient.[name] Respirations regular, non-labored and within target range.. Temperature is normal and within the target range for the patient.[name] appears in no distress. Somewhat pale but he doesn't look any different.. Vitals Time Taken: 2:52 PM, Height: 73 in, Weight: 206 lbs, BMI: 27.2, Temperature: 98.4 F, Pulse: 89 bpm, Respiratory Rate: 16 breaths/min, Blood Pressure: 99/63 mmHg. Respiratory Respiratory effort is easy and symmetric bilaterally. Rate is normal at rest and on room air.. Cardiovascular He appears to be well hydrated. Gastrointestinal (GI) Abdomen is soft and non-distended without masses or tenderness. Bowel sounds active in all quadrants.. Psychiatric Usual somewhat flat affect but he does not appear to be depressed. General Notes: Would exam; the wound over the right buttock is coming in nicely. There is no exposed bone healthy granulation. There is still some depth to the middle of the  wound which I think is justification for continuing the wound VAC. There is no evidence of surrounding infection Integumentary (Hair, Skin) No primary skin issues are seen. Wound #1 status is Open. Original cause of wound was Gradually Appeared. The wound is located on the Right Gluteus. The wound measures 5.8cm length x 4cm width x 1.6cm depth; 18.221cm^2 area and 29.154cm^3 volume. There is bone, muscle, Fat Layer (Subcutaneous Tissue) Exposed, and fascia exposed. There is no tunneling or undermining noted. There is a large amount of serosanguineous drainage noted. The wound margin is distinct with the outline attached to the wound base. There is large (67-100%) red, pink, hyper - granulation within the wound bed. There is a small (1-33%) amount of necrotic tissue within the wound bed including Adherent Slough. The periwound skin appearance exhibited: Scarring. The periwound skin appearance did not exhibit: Callus, Crepitus, Excoriation, Induration, Rash, Dry/Scaly, Maceration, Atrophie Blanche, Cyanosis, Ecchymosis, Hemosiderin Staining, Mottled, Pallor, Rubor, Erythema. Periwound temperature was noted as No Abnormality. The periwound has tenderness on palpation. JONNATHON, JAGIELSKI ([id number]) Assessment Active Problems ICD-10 Pressure ulcer of right buttock, stage 4 Disruption of external operation (surgical) wound, not elsewhere classified, subsequent encounter Unspecified severe protein-calorie malnutrition Plan Wound Cleansing: Wound #1 Right Gluteus: Clean wound with Normal Saline. Cleanse wound with mild soap and water Anesthetic (add to Medication List): Wound #1 Right Gluteus: Topical Lidocaine 4% cream applied to wound bed prior to debridement (In Clinic Only). Skin Barriers/Peri-Wound Care: Wound #1 Right Gluteus: Skin Prep Primary Wound Dressing: Wound #1 Right Gluteus: Saline moistened gauze - until wound vac reapplied at facility Silver Collagen - silver collagen to  undermining area above ischial tuberosity under NPWT foam Secondary Dressing: Wound #1 Right Gluteus: ABD pad -  3rd layer in clinic only Dry Gauze - 2nd layer in clinic only Other - secure with Tegaderm or tape in clinic only Dressing Change Frequency: Wound #1 Right Gluteus: Change Dressing Monday, Wednesday, Friday - NPWT Follow-up Appointments: Wound #1 Right Gluteus: Return Appointment in 2 weeks. Off-Loading: Wound #1 Right Gluteus: Mattress - Air mattress Turn and reposition every 2 hours Additional Orders / Instructions: Wound #1 Right Gluteus: Increase protein intake. Other: - Pro-stat for wound healing twice daily. Negative Pressure Wound Therapy: Wound #1 Right Gluteus: Wound VAC settings at 125/130 mmHg continuous pressure. Use BLACK/GREEN foam to wound cavity. Use WHITE foam to fill any tunnel/s and/or undermining. Change VAC dressing 3 X WEEK. Change canister as indicated when full. Nurse may titrate settings and frequency of dressing changes as clinically indicated. [facility] to reapply Home Health Nurse may d/c [facility] for s/s of increased infection, significant wound regression, or uncontrolled drainage. [facility] at [phone]. ALDIE, SOISSON. ([id number]) #1 I think we continue the wound VAC at this point Collagen under the foam in the central part of the wound #2 he may be getting close to the point where we can discontinue the wound VAC but I would like to granulation tissue at a level surface with the surrounding tissue. He is not quite there yet. No evidence of infection or seeing Electronic Signature(s) Signed: 08/31/2018 5:32:05 PM By: Baltazar Najjar MD Entered By: Baltazar Najjar on 08/31/2018 17:23:54 Silberman, [name] ([id number]) -------------------------------------------------------------------------------- SuperBill Details Patient Name: [name]. Date of Service: 08/31/2018 Medical Record Number:  [id number] Patient Account Number: [id number] Date of Birth/Sex: 1956-01-15 (62 y.o. M) Treating RN: Huel Coventry Primary Care Provider: Sindy Messing Other Clinician: Referring Provider: Sindy Messing Treating Provider/Extender: Altamese [place] in Treatment: 13 Diagnosis Coding ICD-10 Codes Code Description L89.314 Pressure ulcer of right buttock, stage 4 T81.31XD Disruption of external operation (surgical) wound, not elsewhere classified, subsequent encounter E43 Unspecified severe protein-calorie malnutrition Facility Procedures CPT4 Code: [id number] Description: 99213 - WOUND CARE VISIT-LEV 3 EST PT Modifier: Quantity: 1 Physician Procedures CPT4: Description Modifier Quantity Code [id number] 99213 - WC PHYS LEVEL 3 - EST PT 1 ICD-10 Diagnosis Description L89.314 Pressure ulcer of right buttock, stage 4 T81.31XD Disruption of external operation (surgical) wound, not elsewhere classified,  subsequent encounter E43 Unspecified severe protein-calorie malnutrition Electronic Signature(s) Signed: 08/31/2018 5:32:05 PM By: Baltazar Najjar MD Entered By: Baltazar Najjar on 08/31/2018 17:24:17

## 2018-09-14 ENCOUNTER — Encounter: Payer: Medicaid Other | Attending: Internal Medicine | Admitting: Internal Medicine

## 2018-09-14 DIAGNOSIS — Z87891 Personal history of nicotine dependence: Secondary | ICD-10-CM | POA: Diagnosis not present

## 2018-09-14 DIAGNOSIS — I110 Hypertensive heart disease with heart failure: Secondary | ICD-10-CM | POA: Insufficient documentation

## 2018-09-14 DIAGNOSIS — I2510 Atherosclerotic heart disease of native coronary artery without angina pectoris: Secondary | ICD-10-CM | POA: Insufficient documentation

## 2018-09-14 DIAGNOSIS — Z8673 Personal history of transient ischemic attack (TIA), and cerebral infarction without residual deficits: Secondary | ICD-10-CM | POA: Diagnosis not present

## 2018-09-14 DIAGNOSIS — Z7952 Long term (current) use of systemic steroids: Secondary | ICD-10-CM | POA: Diagnosis not present

## 2018-09-14 DIAGNOSIS — Z79899 Other long term (current) drug therapy: Secondary | ICD-10-CM | POA: Diagnosis not present

## 2018-09-14 DIAGNOSIS — L89314 Pressure ulcer of right buttock, stage 4: Secondary | ICD-10-CM | POA: Diagnosis present

## 2018-09-14 DIAGNOSIS — E43 Unspecified severe protein-calorie malnutrition: Secondary | ICD-10-CM | POA: Insufficient documentation

## 2018-09-14 DIAGNOSIS — I252 Old myocardial infarction: Secondary | ICD-10-CM | POA: Insufficient documentation

## 2018-09-14 DIAGNOSIS — I509 Heart failure, unspecified: Secondary | ICD-10-CM | POA: Diagnosis not present

## 2018-09-14 DIAGNOSIS — M069 Rheumatoid arthritis, unspecified: Secondary | ICD-10-CM | POA: Insufficient documentation

## 2018-09-16 NOTE — Progress Notes (Signed)
[name], [name] ([id number]) Visit Report for 09/14/2018 HPI Details Patient Name: [name], [name] Date of Service: 09/14/2018 2:45 PM Medical Record Number: [id number] Patient Account Number: [id number] Date of Birth/Sex: 1955/12/06 (62 y.o. M) Treating RN: [name] Primary Care Provider: Sindy [name] Other Clinician: Referring Provider: Sindy [name] Treating Provider/Extender: [name] in Treatment: 15 History of Present Illness HPI Description: ADMISSION 06/01/18 This is a 62 year old man who is sent to [place] currently residing at [facility] skilled facility. He has a complicated recent medical history. His sister accompanies him tells [place] that he has been somewhat disabled since suffering a left basal ganglial CVA in 2018. At that point in time he also had myocardial infarctions. More recently he has been hospitalized from 04/04/18 through 04/12/18 with septic shock, coronary artery disease, congestive heart failure and an acute CVA. During this hospitalization he was noted to have sacral wounds but these were not felt to be the source of infection however the source of the infection was never really determined at that point. I believe he was sent to a skilled facility. He reports that at the hospital on 05/09/18 through 05/18/18. At that point he was noted to have a large abscess of the right buttock. He required a surgical IandD by Dr. Gerrit [name] of general surgery. Culture of this grew Citrobacter,Eikenella and Bacteroides. He had a CT scan of the pelvis that showed an abnormal soft tissue collection just caudal to the right ischial tuberosity measuring 5.9 and by 4.7. This also involved the distal right deep muscles. There was some cortical bone loss on the right ischial tuberosity and there was some concern with osteomyelitis. He was felt to require treatment for osteomyelitis based on clinical and radiographic findings. He did not have an MRI. Past medical history includes;  abdominal aortic aneurysm, carotid stenosis, hypertension, osteoarthritis, interstitial lung disease on chronic steroids, rheumatoid arthritis, congestive heart failure/ischemic cardiomyopathy with an ejection fraction of 35-40% Also noteworthy is on 05/09/18 his albumin was 2.2 on admission to hospital. I don't think this is actually been repeated. The patient states his appetite is good and he is taking supplements 100%. 06/15/18-He is seen in follow up vibration for right buttock ulcer; this is stable in appearance with minimal amount of non-viable tissue. 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 We've been using a wound VAC. This is centered over the right ischial tuberosity. He is making generally good progress. He was severely hypoalbuminemic at one time although he states that he eats well and takes his supplements. 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 We have been using silver collagen over the superior aspect of the right ischial tuberosity under wound VAC.he has made some nice improvements. The dimensions of the wound are certainly smaller. He has no exposed bone. We received lab work from [facility]. His prealbumin was 18.6 and albumin at 2.57. This is obviously still low. I am not sure what interventions are in place. I know he is on supplements. I discussed this with the patient. I told him that the albumin represents already moderate to severe protein malnutrition. This would contribute it to do difficulty healing the wound 07/20/18; I received copies of lab work from [facility] on 07/06/18 documenting a sedimentation rate of 25 which seems to be in line with what I see  from previous values however his C-reactive protein was 110.8 versus 84.7 on 06/22/18. I have looked through [facility] health Link I  don't see a prior value for either inflammatory measure. he has been treated with [name] which I think he is still on for osteomyelitis of the right ischial tuberosity as well as a soft tissue collection just caudad to the right ischial tuberosity itself. Felt to have a soft tissue abscess and/or stigmata of necrotizing fasciitis. I'm not sure who is following this. His CT scan was on 05/09/18 yet he still remains on Invanz. Looking back through the original discharge summary the [name] was supposed to finish on 07/05/18. 08/03/18 the patient continues to make good improvement. The area over the right ischial tuberosity has filled in nicely. His developed some what looks to be candidal skin infection in the periwound. I think they've taken the wound VAC off and are applying nystatin [name], SPEAK ([id number]) 08/17/18; 2 week follow-up for this resident from [facility]. They resumed the wound VAC. The area continues to fill in nicely. The only problem area is the tissue over the ischial tuberosity itself which needs to come up a bit. For this reason I'm continuing the wound VAC. Otherwise this is doing remarkably well. The patient states that he is eating everything they give him although I don't see a follow-up albumin. 08/31/18; the patient states he had blood work done today. I do not have these results. He claims to be eating well. He continues with a wound VAC and the wound is making nice progress. 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 This is coming quite nicely he still has one [name] that has some depth but I think by the next time we see him in 2 weeks we should be able to move on from the wound VAC. He is still at skilled care at [facility]. As far as I am aware he is eating and drinking well. I have  not seen follow-up albumin studies on him recently but the message I'm getting is that he is eating well and taking his supplements. The patient states he is not very mobile and I am hopeful that we are able to insure pressure relief here however if we hadn't been ensuring pressure relief I doubt this would've closed in as well as it has even with the wound VAC. He does not complain of any systemic issues Electronic Signature(s) Signed: 09/14/2018 5:24:02 PM By: Baltazar Najjar MD Entered By: Baltazar Najjar on 09/14/2018 17:14:30 Oehlert, [name] ([id number]) -------------------------------------------------------------------------------- Physical Exam Details Patient Name: [name], [name] Date of Service: 09/14/2018 2:45 PM Medical Record Number: [id number] Patient Account Number: [id number] Date of Birth/Sex: Aug 10, 1956 (62 y.o. M) Treating RN: [name] Primary Care Provider: Sindy [name] Other Clinician: Referring Provider: Sindy [name] Treating Provider/Extender: Maxwell Caul Weeks in Treatment: 15 Constitutional Patient is hypotensive. Slightly hypotensive however he appears well. Pulse regular and within target range for patient.. Temperature is normal and within the target range for the patient.[name] appears in no distress. Respiratory Respiratory effort is easy and symmetric bilaterally. Rate is normal at rest and on room air.. Cardiovascular Heart rhythm and rate regular, without murmur or gallop.. Gastrointestinal (GI) Abdomen is soft and non-distended without masses or tenderness. Bowel sounds active in all quadrants.. No liver or  spleen enlargement or tenderness.. Genitourinary (GU) No bladder distention. Integumentary (Hair, Skin) Somewhat pale looking however no systemic rashes seen. Psychiatric Agent seems at baseline awake and intact.. Notes Wound exam; the wound over the right buttock is closing in nicely. There is no exposed bone only healthy granulation  is apparent. At the inferior part of this there is still some depth and I think that is reason enough to continue the wound VAC but I think in another couple weeks we may be able to move on. He says some what looks to be tinea around the wound however this is minimal at this point Electronic Signature(s) Signed: 09/14/2018 5:24:02 PM By: Baltazar Najjar MD Entered By: Baltazar Najjar on 09/14/2018 17:22:12 Bueche, [name] ([id number]) -------------------------------------------------------------------------------- Physician Orders Details Patient Name: [name], [name] Date of Service: 09/14/2018 2:45 PM Medical Record Number: [id number] Patient Account Number: [id number] Date of Birth/Sex: 06-07-1956 (62 y.o. M) Treating RN: [name] Primary Care Provider: Sindy [name] Other Clinician: Referring Provider: Sindy [name] Treating Provider/Extender: Ryan [place] in Treatment: 15 Verbal / Phone Orders: No Diagnosis Coding Wound Cleansing Wound #1 Right Gluteus o Clean wound with Normal Saline. o Cleanse wound with mild soap and water Anesthetic (add to Medication List) Wound #1 Right Gluteus o Topical Lidocaine 4% cream applied to wound bed prior to debridement (In Clinic Only). Skin Barriers/Peri-Wound Care Wound #1 Right Gluteus o Skin Prep Primary Wound Dressing Wound #1 Right Gluteus o Saline moistened gauze - until wound vac reapplied at facility o Silver Collagen - silver collagen to undermining area above ischial tuberosity under NPWT foam Secondary Dressing Wound #1 Right Gluteus o ABD pad - 3rd layer in clinic only o Dry Gauze - 2nd layer in clinic only o Other - secure with Tegaderm or tape in clinic only Dressing Change Frequency Wound #1 Right Gluteus o Change Dressing Monday, Wednesday, Friday - NPWT Follow-up Appointments Wound #1 Right Gluteus o Return Appointment in 2 weeks. Off-Loading Wound #1 Right Gluteus o Mattress  - Air mattress o Turn and reposition every 2 hours Additional Orders / Instructions Wound #1 Right Gluteus o Increase protein intake. o Other: - Pro-stat for wound healing twice daily. [name], [name] ([id number]) Negative Pressure Wound Therapy Wound #1 Right Gluteus o Wound VAC settings at 125/130 mmHg continuous pressure. Use BLACK/GREEN foam to wound cavity. Use WHITE foam to fill any tunnel/s and/or undermining. Change VAC dressing 3 X WEEK. Change canister as indicated when full. Nurse may titrate settings and frequency of dressing changes as clinically indicated. [facility] to reapply o Home Health Nurse may d/c [facility] for s/s of increased infection, significant wound regression, or uncontrolled drainage. Notify [facility] at [phone]. Notes Wound Vac for 2 more weeks Electronic Signature(s) Signed: 09/14/2018 5:24:02 PM By: Baltazar Najjar MD Signed: 09/14/2018 5:34:01 PM By: Elliot Gurney, BSN, RN, CWS, Kim RN, BSN Entered By: Elliot Gurney, BSN, RN, CWS, Kim on 09/14/2018 15:27:46 Lubben, [name] ([id number]) -------------------------------------------------------------------------------- Problem List Details Patient Name: [name], [name] Date of Service: 09/14/2018 2:45 PM Medical Record Number: [id number] Patient Account Number: [id number] Date of Birth/Sex: 1956/10/11 (62 y.o. M) Treating RN: [name] Primary Care Provider: Sindy [name] Other Clinician: Referring Provider: Sindy [name] Treating Provider/Extender: Ryan [place] in Treatment: 15 Active Problems ICD-10 Evaluated Encounter Code Description Active Date Today Diagnosis L89.314 Pressure ulcer of right buttock, stage 4 06/01/2018 No Yes T81.31XD Disruption of external operation (surgical) wound, not 06/01/2018 No Yes elsewhere classified,  subsequent encounter E43 Unspecified severe protein-calorie malnutrition 06/01/2018 No Yes Inactive Problems ICD-10 Code Description  Active Date Inactive Date M86.18 Other acute osteomyelitis, other site 06/01/2018 06/01/2018 Resolved Problems Electronic Signature(s) Signed: 09/14/2018 5:24:02 PM By: Baltazar Najjar MD Entered By: Baltazar Najjar on 09/14/2018 17:09:17 Burgin, Abhishek W. ([id number]) -------------------------------------------------------------------------------- Progress Note Details Patient Name: [name] Date of Service: 09/14/2018 2:45 PM Medical Record Number: [id number] Patient Account Number: [id number] Date of Birth/Sex: 06/27/1956 (62 y.o. M) Treating RN: [name] Primary Care Provider: Sindy [name] Other Clinician: Referring Provider: Sindy [name] Treating Provider/Extender: Ryan [place] in Treatment: 15 Subjective History of Present Illness (HPI) ADMISSION 06/01/18 This is a 62 year old man who is sent to [place] currently residing at [facility] skilled facility. He has a complicated recent medical history. His sister accompanies him tells [place] that he has been somewhat disabled since suffering a left basal ganglial CVA in 2018. At that point in time he also had myocardial infarctions. More recently he has been hospitalized from 04/04/18 through 04/12/18 with septic shock, coronary artery disease, congestive heart failure and an acute CVA. During this hospitalization he was noted to have sacral wounds but these were not felt to be the source of infection however the source of the infection was never really determined at that point. I believe he was sent to a skilled facility. He reports that at the hospital on 05/09/18 through 05/18/18. At that point he was noted to have a large abscess of the right buttock. He required a surgical IandD by Dr. Gerrit [name] of general surgery. Culture of this grew Citrobacter,Eikenella and Bacteroides. He had a CT scan of the pelvis that showed an abnormal soft tissue collection just caudal to the right ischial tuberosity measuring 5.9 and by 4.7. This  also involved the distal right deep muscles. There was some cortical bone loss on the right ischial tuberosity and there was some concern with osteomyelitis. He was felt to require treatment for osteomyelitis based on clinical and radiographic findings. He did not have an MRI. Past medical history includes; abdominal aortic aneurysm, carotid stenosis, hypertension, osteoarthritis, interstitial lung disease on chronic steroids, rheumatoid arthritis, congestive heart failure/ischemic cardiomyopathy with an ejection fraction of 35-40% Also noteworthy is on 05/09/18 his albumin was 2.2 on admission to hospital. I don't think this is actually been repeated. The patient states his appetite is good and he is taking supplements 100%. 06/15/18-He is seen in follow up vibration for right buttock ulcer; this is stable in appearance with minimal amount of non-viable tissue. 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 We've been using a wound VAC. This is centered over the right ischial tuberosity. He is making generally good progress. He was severely hypoalbuminemic at one time although he states that he eats well and takes his supplements. 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 We have been using silver collagen over the superior aspect of the right ischial tuberosity under wound VAC.he has made some nice improvements. The dimensions of the wound are certainly smaller. He has no exposed bone. We received lab work from [facility]. His prealbumin was 18.6 and albumin at 2.57. This is obviously still low. I am not sure what interventions are in place. I know he is on supplements. I discussed this with the patient. I told him that the albumin  represents already moderate to severe protein malnutrition. This would contribute it to  do difficulty healing the wound 07/20/18; I received copies of lab work from [facility] on 07/06/18 documenting a sedimentation rate of 25 which seems to be in line with what I see from previous values however his C-reactive protein was 110.8 versus 84.7 on 06/22/18. I have looked through [facility] health Link I don't see a prior value for either inflammatory measure. he has been treated with [name] which I think he is still on for osteomyelitis of the right ischial tuberosity as well as a soft tissue collection just caudad to the right ischial tuberosity itself. Felt to have a soft tissue abscess and/or stigmata of necrotizing fasciitis. I'm not sure who is following this. His CT scan was on 05/09/18 yet he still remains on Invanz. Looking back through the original discharge summary the [name] was supposed to finish on 07/05/18. 08/03/18 the patient continues to make good improvement. The area over the right ischial tuberosity has filled in nicely. His developed some what looks to be candidal skin infection in the periwound. I think they've taken the wound VAC off and are applying nystatin 08/17/18; 2 week follow-up for this resident from [facility]. They resumed the wound VAC. The area continues to fill in nicely. The only problem area is the tissue over the ischial tuberosity itself which needs to come up a bit. For this reason Shaheer Bonfield, Kahleel W. ([id number]) continuing the wound VAC. Otherwise this is doing remarkably well. The patient states that he is eating everything they give him although I don't see a follow-up albumin. 08/31/18; the patient states he had blood work done today. I do not have these results. He claims to be eating well. He continues with a wound VAC and the wound is making nice progress. This was a 1 time a deep stage IV wound over the right ischial tuberosity with accompanied osteomyelitis 09/14/18; patient is still using a wound VAC to the large stage IV pressure ulcer On the  right ischial tuberosity area.. This is coming quite nicely he still has one [name] that has some depth but I think by the next time we see him in 2 weeks we should be able to move on from the wound VAC. He is still at skilled care at [facility]. As far as I am aware he is eating and drinking well. I have not seen follow-up albumin studies on him recently but the message I'm getting is that he is eating well and taking his supplements. The patient states he is not very mobile and I am hopeful that we are able to insure pressure relief here however if we hadn't been ensuring pressure relief I doubt this would've closed in as well as it has even with the wound VAC. He does not complain of any systemic issues Objective Constitutional Patient is hypotensive. Slightly hypotensive however he appears well. Pulse regular and within target range for patient.. Temperature is normal and within the target range for the patient.[name] appears in no distress. Vitals Time Taken: 2:47 PM, Height: 73 in, Weight: 206 lbs, BMI: 27.2, Temperature: 98.4 F, Pulse: 77 bpm, Respiratory Rate: 16 breaths/min, Blood Pressure: 98/68 mmHg. General Notes: Wound exam; the wound over the right buttock is closing in nicely. There is no exposed bone only healthy granulation is apparent. At the inferior part of this there is still some depth and I think that is reason enough to continue the wound VAC but I  think in another couple weeks we may be able to move on. He says some what looks to be tinea around the wound however this is minimal at this point Integumentary (Hair, Skin) Wound #1 status is Open. Original cause of wound was Gradually Appeared. The wound is located on the Right Gluteus. The wound measures 5cm length x 3cm width x 1.1cm depth; 11.781cm^2 area and 12.959cm^3 volume. There is bone, muscle, Fat Layer (Subcutaneous Tissue) Exposed, and fascia exposed. There is no tunneling or undermining noted. There is a medium  amount of serosanguineous drainage noted. The wound margin is distinct with the outline attached to the wound base. There is large (67-100%) red, pink, hyper - granulation within the wound bed. There is a small (1-33%) amount of necrotic tissue within the wound bed including Adherent Slough. The periwound skin appearance exhibited: Scarring. The periwound skin appearance did not exhibit: Callus, Crepitus, Excoriation, Induration, Rash, Dry/Scaly, Maceration, Atrophie Blanche, Cyanosis, Ecchymosis, Hemosiderin Staining, Mottled, Pallor, Rubor, Erythema. Periwound temperature was noted as No Abnormality. The periwound has tenderness on palpation. Assessment Active Problems ICD-10 Pressure ulcer of right buttock, stage 4 Disruption of external operation (surgical) wound, not elsewhere classified, subsequent encounter [name], [name] ([id number]) Unspecified severe protein-calorie malnutrition Plan Wound Cleansing: Wound #1 Right Gluteus: Clean wound with Normal Saline. Cleanse wound with mild soap and water Anesthetic (add to Medication List): Wound #1 Right Gluteus: Topical Lidocaine 4% cream applied to wound bed prior to debridement (In Clinic Only). Skin Barriers/Peri-Wound Care: Wound #1 Right Gluteus: Skin Prep Primary Wound Dressing: Wound #1 Right Gluteus: Saline moistened gauze - until wound vac reapplied at facility Silver Collagen - silver collagen to undermining area above ischial tuberosity under NPWT foam Secondary Dressing: Wound #1 Right Gluteus: ABD pad - 3rd layer in clinic only Dry Gauze - 2nd layer in clinic only Other - secure with Tegaderm or tape in clinic only Dressing Change Frequency: Wound #1 Right Gluteus: Change Dressing Monday, Wednesday, Friday - NPWT Follow-up Appointments: Wound #1 Right Gluteus: Return Appointment in 2 weeks. Off-Loading: Wound #1 Right Gluteus: Mattress - Air mattress Turn and reposition every 2 hours Additional Orders /  Instructions: Wound #1 Right Gluteus: Increase protein intake. Other: - Pro-stat for wound healing twice daily. Negative Pressure Wound Therapy: Wound #1 Right Gluteus: Wound VAC settings at 125/130 mmHg continuous pressure. Use BLACK/GREEN foam to wound cavity. Use WHITE foam to fill any tunnel/s and/or undermining. Change VAC dressing 3 X WEEK. Change canister as indicated when full. Nurse may titrate settings and frequency of dressing changes as clinically indicated. [facility] to reapply Home Health Nurse may d/c [facility] for s/s of increased infection, significant wound regression, or uncontrolled drainage. Notify [facility] at [phone]. General Notes: Wound Vac for 2 more weeks #1 I think another 2 weeks of the wound VAC and then we should be able to move on to an alternative dressing. By that time the granulation tissue should be at the level of the surrounding skin. There is no current evidence of infection. [name], [name] ([id number]) Electronic Signature(s) Signed: 09/14/2018 5:24:02 PM By: Baltazar Najjar MD Entered By: Baltazar Najjar on 09/14/2018 17:21:17 Genther, [name] ([id number]) -------------------------------------------------------------------------------- SuperBill Details Patient Name: [name], [name] Date of Service: 09/14/2018 Medical Record Number: [id number] Patient Account Number: [id number] Date of Birth/Sex: 22-Dec-1955 (62 y.o. M) Treating RN: [name] Primary Care Provider: Sindy [name] Other Clinician: Referring Provider: Sindy [name] Treating Provider/Extender: Maxwell Caul Weeks in Treatment:  15 Diagnosis Coding ICD-10 Codes Code Description L89.314 Pressure ulcer of right buttock, stage 4 T81.31XD Disruption of external operation (surgical) wound, not elsewhere classified, subsequent encounter E43 Unspecified severe protein-calorie malnutrition Facility Procedures CPT4 Code: [id number] Description: [phone] - WOUND  CARE VISIT-LEV 2 EST PT Modifier: Quantity: 1 Physician Procedures CPT4 Code: [id number] Description: 99213 - WC PHYS LEVEL 3 - EST PT ICD-10 Diagnosis Description L89.314 Pressure ulcer of right buttock, stage 4 E43 Unspecified severe protein-calorie malnutrition Modifier: Quantity: 1 Electronic Signature(s) Signed: 09/14/2018 5:24:02 PM By: Baltazar Najjar MD Entered By: Baltazar Najjar on 09/14/2018 17:22:41

## 2018-09-16 NOTE — Progress Notes (Signed)
RYLO, FICKER ([id number]) Visit Report for 09/14/2018 Arrival Information Details Patient Name: [name], [name] Date of Service: 09/14/2018 2:45 PM Medical Record Number: [id number] Patient Account Number: [id number] Date of Birth/Sex: 1956/01/20 (62 y.o. M) Treating RN: Huel Coventry Primary Care Wanisha Shiroma: Sindy Messing Other Clinician: Referring Cleston Lautner: Sindy Messing Treating Kalia Vahey/Extender: Altamese [place] in Treatment: 15 Visit Information History Since Last Visit Added or deleted any medications: No Patient Arrived: Stretcher Any new allergies or adverse reactions: No Arrival Time: 14:52 Had a fall or experienced change in No Accompanied By: EMS and activities of daily living that may affect sister risk of falls: Transfer Assistance: Stretcher Signs or symptoms of abuse/neglect since last visito No Patient Identification Verified: Yes Hospitalized since last visit: No Secondary Verification Process Completed: Yes Implantable device outside of the clinic excluding No Patient Requires Transmission-Based No cellular tissue based products placed in the center Precautions: since last visit: Patient Has Alerts: No Has Dressing in Place as Prescribed: Yes Pain Present Now: No Electronic Signature(s) Signed: 09/14/2018 4:23:06 PM By: Dayton Martes RCP, RRT, CHT Entered By: Dayton Martes on 09/14/2018 14:52:43 Wilz, [name] ([id number]) -------------------------------------------------------------------------------- Clinic Level of Care Assessment Details Patient Name: [place], [name] Date of Service: 09/14/2018 2:45 PM Medical Record Number: [id number] Patient Account Number: [id number] Date of Birth/Sex: 01/06/1956 (62 y.o. M) Treating RN: Huel Coventry Primary Care Marlena Barbato: Sindy Messing Other Clinician: Referring Pierre Cumpton: Sindy Messing Treating Anjelina Dung/Extender: Altamese [place] in Treatment: 15 Clinic Level of Care  Assessment Items TOOL 4 Quantity Score []  - Use when only an EandM is performed on FOLLOW-UP visit 0 ASSESSMENTS - Nursing Assessment / Reassessment []  - Reassessment of Co-morbidities (includes updates in patient status) 0 X- 1 5 Reassessment of Adherence to Treatment Plan ASSESSMENTS - Wound and Skin Assessment / Reassessment X - Simple Wound Assessment / Reassessment - one wound 1 5 []  - 0 Complex Wound Assessment / Reassessment - multiple wounds []  - 0 Dermatologic / Skin Assessment (not related to wound area) ASSESSMENTS - Focused Assessment []  - Circumferential Edema Measurements - multi extremities 0 []  - 0 Nutritional Assessment / Counseling / Intervention []  - 0 Lower Extremity Assessment (monofilament, tuning fork, pulses) []  - 0 Peripheral Arterial Disease Assessment (using hand held doppler) ASSESSMENTS - Ostomy and/or Continence Assessment and Care []  - Incontinence Assessment and Management 0 []  - 0 Ostomy Care Assessment and Management (repouching, etc.) PROCESS - Coordination of Care X - Simple Patient / Family Education for ongoing care 1 15 []  - 0 Complex (extensive) Patient / Family Education for ongoing care []  - 0 Staff obtains Chiropractor, Records, Test Results / Process Orders []  - 0 Staff telephones HHA, Nursing Homes / Clarify orders / etc []  - 0 Routine Transfer to another Facility (non-emergent condition) []  - 0 Routine Hospital Admission (non-emergent condition) []  - 0 New Admissions / Manufacturing engineer / Ordering NPWT, Apligraf, etc. []  - 0 Emergency Hospital Admission (emergent condition) X- 1 10 Simple Discharge Coordination [name], [name] ([id number]) []  - 0 Complex (extensive) Discharge Coordination PROCESS - Special Needs []  - Pediatric / Minor Patient Management 0 []  - 0 Isolation Patient Management []  - 0 Hearing / Language / Visual special needs []  - 0 Assessment of Community assistance (transportation, D/C planning,  etc.) []  - 0 Additional assistance / Altered mentation []  - 0 Support Surface(s) Assessment (bed, cushion, seat, etc.) INTERVENTIONS - Wound Cleansing / Measurement X - Simple Wound Cleansing - one wound 1  5 []  - 0 Complex Wound Cleansing - multiple wounds X- 1 5 Wound Imaging (photographs - any number of wounds) []  - 0 Wound Tracing (instead of photographs) X- 1 5 Simple Wound Measurement - one wound []  - 0 Complex Wound Measurement - multiple wounds INTERVENTIONS - Wound Dressings []  - Small Wound Dressing one or multiple wounds 0 X- 1 15 Medium Wound Dressing one or multiple wounds []  - 0 Large Wound Dressing one or multiple wounds []  - 0 Application of Medications - topical []  - 0 Application of Medications - injection INTERVENTIONS - Miscellaneous []  - External ear exam 0 []  - 0 Specimen Collection (cultures, biopsies, blood, body fluids, etc.) []  - 0 Specimen(s) / Culture(s) sent or taken to Lab for analysis []  - 0 Patient Transfer (multiple staff / / Similar devices) []  - 0 Simple Staple / Suture removal (25 or less) []  - 0 Complex Staple / Suture removal (26 or more) []  - 0 Hypo / Hyperglycemic Management (close monitor of Blood Glucose) []  - 0 Ankle / Brachial Index (ABI) - do not check if billed separately X- 1 5 Vital Signs Encarnacion, [name] ( ) Has the patient been seen at the hospital within the last three years: Yes Total Score: 70 Level Of Care: New/Established - Level 2 Electronic Signature(s) Signed: 09/14/2018 5:34:01 PM By: , BSN, RN, CWS, Kim RN, BSN Entered By: , BSN, RN, CWS, Kim on 09/14/2018 15:25:34 Plaut, ( ) -------------------------------------------------------------------------------- Encounter Discharge Information Details Patient Name: . Date of Service: 09/14/2018 2:45 PM Medical Record Number: Patient Account Number: Nurse, adult Date of Birth/Sex:  1955-12-24 (62 y.o. M) Treating RN: Primary Care Jalin Alicea: Other Clinician: Referring Mika Griffitts: [phone] Treating Mykiah Schmuck/Extender: 09/16/2018 in Treatment: 15 Encounter Discharge Information Items Discharge Condition: Stable Ambulatory Status: Stretcher Discharge Destination: Skilled Nursing Facility Telephoned: No Orders Sent: Yes Transportation: Ambulance Accompanied By: EMS Schedule Follow-up Appointment: Yes Clinical Summary of Care: Electronic Signature(s) Signed: 09/14/2018 3:42:20 PM By: Elliot Gurney Entered By: 09/16/2018 on 09/14/2018 15:42:20 [place], [name] [id number] ([name]) -------------------------------------------------------------------------------- Lower Extremity Assessment Details Patient Name: 09/16/2018. Date of Service: 09/14/2018 2:45 PM Medical Record Number: [id number] Patient Account Number: 10/20/1956 Date of Birth/Sex: [birth year] (62 y.o. M) Treating RN: Sindy Messing Primary Care Leib Elahi: Sindy Messing Other Clinician: Referring Shedrick Sarli: Altamese [place] Treating Japji Kok/Extender: 09/16/2018 Weeks in Treatment: 15 Electronic Signature(s) Signed: 09/14/2018 4:11:31 PM By: Curtis Sites Entered By: 09/16/2018 on 09/14/2018 14:57:54 Staup, Ashaz W. ([id number]) -------------------------------------------------------------------------------- Multi Wound Chart Details Patient Name: [name] Date of Service: 09/14/2018 2:45 PM Medical Record Number: [id number] Patient Account Number: [id number] Date of Birth/Sex: [birth year] (62 y.o. M) Treating RN: Rema Jasmine Primary Care Karri Kallenbach: Sindy Messing Other Clinician: Referring Makaylia Hewett: Sindy Messing Treating Sevin Farone/Extender: Maxwell Caul in Treatment: 15 Vital Signs Height(in): 73 Pulse(bpm): 77 Weight(lbs): 206 Blood Pressure(mmHg): 98/68 Body Mass Index(BMI): 27 Temperature(F): 98.4 Respiratory  Rate 16 (breaths/min): Photos: [N/A:N/A] Wound Location: Right Gluteus N/A N/A Wounding Event: Gradually Appeared N/A N/A Primary Etiology: Pressure Ulcer N/A N/A Comorbid History: Cataracts, Congestive Heart N/A N/A Failure, Coronary Artery Disease, Hypertension, Myocardial Infarction, History of pressure wounds, Rheumatoid Arthritis Date Acquired: 05/09/2018 N/A N/A Weeks of Treatment: 15 N/A N/A Wound Status: Open N/A N/A Measurements L x W x D 5x3x1.1 N/A N/A (cm) Area (cm) : 11.781 N/A N/A Volume (cm) : 12.959 N/A N/A % Reduction in Area: 90.40% N/A N/A %  Reduction in Volume: 97.60% N/A N/A Classification: Category/Stage IV N/A N/A Exudate Amount: Medium N/A N/A Exudate Type: Serosanguineous N/A N/A Exudate Color: red, brown N/A N/A Wound Margin: Distinct, outline attached N/A N/A Granulation Amount: Large (67-100%) N/A N/A Granulation Quality: Red, Pink, Hyper-granulation N/A N/A Necrotic Amount: Small (1-33%) N/A N/A Exposed Structures: Fascia: Yes N/A N/A Fat Layer (Subcutaneous Tissue) Exposed: Yes Butch, Haldon W. ([id number]) Muscle: Yes Bone: Yes Epithelialization: None N/A N/A Periwound Skin Texture: Scarring: Yes N/A N/A Excoriation: No Induration: No Callus: No Crepitus: No Rash: No Periwound Skin Moisture: Maceration: No N/A N/A Dry/Scaly: No Periwound Skin Color: Atrophie Blanche: No N/A N/A Cyanosis: No Ecchymosis: No Erythema: No Hemosiderin Staining: No Mottled: No Pallor: No Rubor: No Temperature: No Abnormality N/A N/A Tenderness on Palpation: Yes N/A N/A Wound Preparation: Ulcer Cleansing: N/A N/A Rinsed/Irrigated with Saline Topical Anesthetic Applied: Other: lidocaine 4% Treatment Notes Wound #1 (Right Gluteus) Notes prisma, saline moistened gauze, ABD secured with tape until back in facility. Facility to apply NPWT. Electronic Signature(s) Signed: 09/14/2018 5:24:02 PM By: Baltazar Najjar MD Entered By: Baltazar Najjar  on 09/14/2018 17:10:07 [place], [name] ([id number]) -------------------------------------------------------------------------------- Multi-Disciplinary Care Plan Details Patient Name: LEVIS, NAZIR. Date of Service: 09/14/2018 2:45 PM Medical Record Number: [id number] Patient Account Number: [id number] Date of Birth/Sex: Apr 06, 1956 (62 y.o. M) Treating RN: Huel Coventry Primary Care Geo Slone: Sindy Messing Other Clinician: Referring Harkirat Orozco: Sindy Messing Treating Jaxon Flatt/Extender: Altamese [place] in Treatment: 15 Active Inactive ` Necrotic Tissue Nursing Diagnoses: Knowledge deficit related to management of necrotic/devitalized tissue Goals: Necrotic/devitalized tissue will be minimized in the wound bed Date Initiated: 06/15/2018 Target Resolution Date: 06/29/2018 Goal Status: Active Interventions: Assess patient pain level pre-, during and post procedure and prior to discharge Treatment Activities: Apply topical anesthetic as ordered : 06/15/2018 Excisional debridement : 06/15/2018 Notes: ` Orientation to the Wound Care Program Nursing Diagnoses: Knowledge deficit related to the wound healing center program Goals: Patient/caregiver will verbalize understanding of the Wound Healing Center Program Date Initiated: 06/15/2018 Target Resolution Date: 07/01/2018 Goal Status: Active Interventions: Provide education on orientation to the wound center Notes: ` Pressure Nursing Diagnoses: Knowledge deficit related to management of pressures ulcers Goals: Patient will remain free from development of additional pressure ulcers [name], [name] ([id number]) Date Initiated: 06/15/2018 Target Resolution Date: 07/01/2018 Goal Status: Active Interventions: Assess: immobility, friction, shearing, incontinence upon admission and as needed Notes: ` Wound/Skin Impairment Nursing Diagnoses: Impaired tissue integrity Goals: Ulcer/skin breakdown will have a volume reduction of  80% by week 12 Date Initiated: 06/15/2018 Target Resolution Date: 09/15/2018 Goal Status: Active Interventions: Assess ulceration(s) every visit Treatment Activities: Skin care regimen initiated : 06/15/2018 Notes: Electronic Signature(s) Signed: 09/14/2018 5:34:01 PM By: Elliot Gurney, BSN, RN, CWS, Kim RN, BSN Entered By: Elliot Gurney, BSN, RN, CWS, Kim on 09/14/2018 15:24:02 [place], [name] ([id number]) -------------------------------------------------------------------------------- Pain Assessment Details Patient Name: [name], [name] Date of Service: 09/14/2018 2:45 PM Medical Record Number: [id number] Patient Account Number: [id number] Date of Birth/Sex: 12/29/1955 (62 y.o. M) Treating RN: Huel Coventry Primary Care Shatiqua Heroux: Sindy Messing Other Clinician: Referring Pauleen Goleman: Sindy Messing Treating Sabryna Lahm/Extender: Altamese [place] in Treatment: 15 Active Problems Location of Pain Severity and Description of Pain Patient Has Paino No Site Locations Pain Management and Medication Current Pain Management: Electronic Signature(s) Signed: 09/14/2018 4:23:06 PM By: Sallee Provencal, RRT, CHT Signed: 09/14/2018 5:34:01 PM By: Elliot Gurney, BSN, RN, CWS, Kim RN, BSN Entered By: Dayton Martes on 09/14/2018 14:52:52 [name], [name] (  [id number]) -------------------------------------------------------------------------------- Patient/Caregiver Education Details Patient Name: [name], [name] Date of Service: 09/14/2018 2:45 PM Medical Record Number: [id number] Patient Account Number: [id number] Date of Birth/Gender: 12-15-1955 (62 y.o. M) Treating RN: Huel Coventry Primary Care Physician: Sindy Messing Other Clinician: Referring Physician: Sindy Messing Treating Physician/Extender: Altamese [place] in Treatment: 15 Education Assessment Education Provided To: Patient Education Topics Provided Pressure: Handouts: Pressure Ulcers: Care and  Offloading Methods: Demonstration, Explain/Verbal Responses: State content correctly Electronic Signature(s) Signed: 09/14/2018 5:34:01 PM By: Elliot Gurney, BSN, RN, CWS, Kim RN, BSN Entered By: Elliot Gurney, BSN, RN, CWS, Kim on 09/14/2018 15:26:00 [name] ([id number]) -------------------------------------------------------------------------------- Wound Assessment Details Patient Name: [name], [name] Date of Service: 09/14/2018 2:45 PM Medical Record Number: [id number] Patient Account Number: [id number] Date of Birth/Sex: Aug 19, 1956 (62 y.o. M) Treating RN: Rema Jasmine Primary Care Tayden Duran: Sindy Messing Other Clinician: Referring Pierson Vantol: Sindy Messing Treating Ala [place]/Extender: Altamese [place] in Treatment: 15 Wound Status Wound Number: 1 Primary Pressure Ulcer Etiology: Wound Location: Right Gluteus Wound Open Wounding Event: Gradually Appeared Status: Date Acquired: 05/09/2018 Comorbid Cataracts, Congestive Heart Failure, Coronary Weeks Of Treatment: 15 History: Artery Disease, Hypertension, Myocardial Clustered Wound: No Infarction, History of pressure wounds, Rheumatoid Arthritis Photos Photo Uploaded By: Rema Jasmine on 09/14/2018 16:01:12 Wound Measurements Length: (cm) 5 Width: (cm) 3 Depth: (cm) 1.1 Area: (cm) 11.781 Volume: (cm) 12.959 % Reduction in Area: 90.4% % Reduction in Volume: 97.6% Epithelialization: None Tunneling: No Undermining: No Wound Description Classification: Category/Stage IV Foul Odo Wound Margin: Distinct, outline attached Slough/F Exudate Amount: Medium Exudate Type: Serosanguineous Exudate Color: red, brown r After Cleansing: No ibrino Yes Wound Bed Granulation Amount: Large (67-100%) Exposed Structure Granulation Quality: Red, Pink, Hyper-granulation Fascia Exposed: Yes Necrotic Amount: Small (1-33%) Fat Layer (Subcutaneous Tissue) Exposed: Yes Necrotic Quality: Adherent Slough Muscle Exposed: Yes Necrosis of Muscle:  No Bone Exposed: Yes Periwound Skin Texture Mcclain, [name] ([id number]) Texture Color No Abnormalities Noted: No No Abnormalities Noted: No Callus: No Atrophie Blanche: No Crepitus: No Cyanosis: No Excoriation: No Ecchymosis: No Induration: No Erythema: No Rash: No Hemosiderin Staining: No Scarring: Yes Mottled: No Pallor: No Moisture Rubor: No No Abnormalities Noted: No Dry / Scaly: No Temperature / Pain Maceration: No Temperature: No Abnormality Tenderness on Palpation: Yes Wound Preparation Ulcer Cleansing: Rinsed/Irrigated with Saline Topical Anesthetic Applied: Other: lidocaine 4%, Treatment Notes Wound #1 (Right Gluteus) Notes prisma, saline moistened gauze, ABD secured with tape until back in facility. Facility to apply NPWT. Electronic Signature(s) Signed: 09/14/2018 4:11:31 PM By: Rema Jasmine Entered By: Rema Jasmine on 09/14/2018 15:06:59 [place], [name] ([id number]) -------------------------------------------------------------------------------- Vitals Details Patient Name: [name], [name] Date of Service: 09/14/2018 2:45 PM Medical Record Number: [id number] Patient Account Number: [id number] Date of Birth/Sex: 04/22/1956 (62 y.o. M) Treating RN: Huel Coventry Primary Care Daeron Carreno: Sindy Messing Other Clinician: Referring Ivyrose Hashman: Sindy Messing Treating Aalaysia Liggins/Extender: Altamese [place] in Treatment: 15 Vital Signs Time Taken: 14:47 Temperature (F): 98.4 Height (in): 73 Pulse (bpm): 77 Weight (lbs): 206 Respiratory Rate (breaths/min): 16 Body Mass Index (BMI): 27.2 Blood Pressure (mmHg): 98/68 Reference Range: 80 - 120 mg / dl Electronic Signature(s) Signed: 09/14/2018 4:23:06 PM By: Dayton Martes RCP, RRT, CHT Entered By: Dayton Martes on 09/14/2018 14:55:04

## 2018-09-28 ENCOUNTER — Encounter: Payer: Medicaid Other | Admitting: Family Medicine

## 2018-09-28 DIAGNOSIS — L89314 Pressure ulcer of right buttock, stage 4: Secondary | ICD-10-CM | POA: Diagnosis not present

## 2018-10-03 NOTE — Progress Notes (Signed)
[name], [name] ([id number]) Visit Report for 09/28/2018 Chief Complaint Document Details Patient Name: [name], [name] Date of Service: 09/28/2018 2:45 PM Medical Record Number: [id number] Patient Account Number: [id number] Date of Birth/Sex: [birth year] (62 y.o. M) Treating RN: Huel Coventry Primary Care Provider: Sindy Messing Other Clinician: Referring Provider: Sindy Messing Treating Provider/Extender: Wilkie Aye Weeks in Treatment: 17 Information Obtained from: Patient Chief Complaint right buttock stage 4 Electronic Signature(s) Signed: 10/02/2018 5:29:42 PM By: Wilkie Aye FNP-C Entered By: Wilkie Aye on 09/28/2018 16:03:26 [name], [name] ([id number]) -------------------------------------------------------------------------------- HPI Details Patient Name: [name] Date of Service: 09/28/2018 2:45 PM Medical Record Number: [id number] Patient Account Number: [id number] Date of Birth/Sex: 05/27/1956 (62 y.o. M) Treating RN: Huel Coventry Primary Care Provider: Sindy Messing Other Clinician: Referring Provider: Sindy Messing Treating Provider/Extender: Youlanda Roys in Treatment: 17 History of Present Illness HPI Description: ADMISSION 06/01/18 This is a 62 year old man who is sent to [place] currently residing at [facility] skilled facility. He has a complicated recent medical history. His sister accompanies him tells [place] that he has been somewhat disabled since suffering a left basal ganglial CVA in 2018. At that point in time he also had myocardial infarctions. More recently he has been hospitalized from 04/04/18 through 04/12/18 with septic shock, coronary artery disease, congestive heart failure and an acute CVA. During this hospitalization he was noted to have sacral wounds but these were not felt to be the source of infection however the source of the infection was never really determined at that point. I believe he was sent to a skilled facility. He  reports that at the hospital on 05/09/18 through 05/18/18. At that point he was noted to have a large abscess of the right buttock. He required a surgical IandD by Dr. Gerrit Friends of general surgery. Culture of this grew Citrobacter,Eikenella and Bacteroides. He had a CT scan of the pelvis that showed an abnormal soft tissue collection just caudal to the right ischial tuberosity measuring 5.9 and by 4.7. This also involved the distal right deep muscles. There was some cortical bone loss on the right ischial tuberosity and there was some concern with osteomyelitis. He was felt to require treatment for osteomyelitis based on clinical and radiographic findings. He did not have an MRI. Past medical history includes; abdominal aortic aneurysm, carotid stenosis, hypertension, osteoarthritis, interstitial lung disease on chronic steroids, rheumatoid arthritis, congestive heart failure/ischemic cardiomyopathy with an ejection fraction of 35-40% Also noteworthy is on 05/09/18 his albumin was 2.2 on admission to hospital. I don't think this is actually been repeated. The patient states his appetite is good and he is taking supplements 100%. 06/15/18-He is seen in follow up vibration for right buttock ulcer; this is stable in appearance with minimal amount of non-viable tissue. 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 We've been using a wound VAC. This is centered over the right ischial tuberosity. He is making generally good progress. He was severely hypoalbuminemic at one time although he states that he eats well and takes his supplements. 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 We have been using silver collagen over the superior aspect of the right ischial tuberosity under wound VAC.he has made some nice improvements.  The dimensions of the wound are certainly smaller. He has no exposed bone. We received lab work from [facility].  His prealbumin was 18.6 and albumin at 2.57. This is obviously still low. I am not sure what interventions are in place. I know he is on supplements. I discussed this with the patient. I told him that the albumin represents already moderate to severe protein malnutrition. This would contribute it to do difficulty healing the wound 07/20/18; I received copies of lab work from [facility] on 07/06/18 documenting a sedimentation rate of 25 which seems to be in line with what I see from previous values however his C-reactive protein was 110.8 versus 84.7 on 06/22/18. I have looked through [facility] health Link I don't see a prior value for either inflammatory measure. he has been treated with [name] which I think he is still on for osteomyelitis of the right ischial tuberosity as well as a soft tissue collection just caudad to the right ischial tuberosity itself. Felt to have a soft tissue abscess and/or stigmata of necrotizing fasciitis. I'm not sure who is following this. His CT scan was on 05/09/18 yet he still remains on Invanz. Looking back through the original discharge summary the [name] was supposed to finish on 07/05/18. 08/03/18 the patient continues to make good improvement. The area over the right ischial tuberosity has filled in nicely. His developed some what looks to be candidal skin infection in the periwound. I think they've taken the wound VAC off and are applying nystatin 08/17/18; 2 week follow-up for this resident from [facility]. They resumed the wound VAC. The area continues to fill in nicely. The only problem area is the tissue over the ischial tuberosity itself which needs to come up a bit. For this reason I'm continuing the wound VAC. Otherwise this is doing remarkably well. The patient states that he is eating everything they give LATRAVIS, MABILE. ([id number]) him although  I don't see a follow-up albumin. 08/31/18; the patient states he had blood work done today. I do not have these results. He claims to be eating well. He continues with a wound VAC and the wound is making nice progress. 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 This is coming quite nicely he still has one Onalee Hua that has some depth but I think by the next time we see him in 2 weeks we should be able to move on from the wound VAC. He is still at skilled care at [facility]. As far as I am aware he is eating and drinking well. I have not seen follow-up albumin studies on him recently but the message I'm getting is that he is eating well and taking his supplements. The patient states he is not very mobile and I am hopeful that we are able to insure pressure relief here however if we hadn't been ensuring pressure relief I doubt this would've closed in as well as it has even with the wound VAC. He does not complain of any systemic issues 09/28/2018 for follow-up and management of stage IV sacral ulcer that is being managed with a wound VAC. He is a resident at a long-term care facility, [facility]. The wound continues to have some depth and width some granulation of tissue on the outer borders of the wound. The wound was tender to touch today during exam. He denies any pain to the wound when it is not being touched.  Recent fevers, chills, nausea, vomiting, or shortness of breath. Hopeful over the next couple of weeks he will be able to transition from the wound VAC. Electronic Signature(s) Signed: 10/02/2018 5:29:42 PM By: Wilkie Aye FNP-C Entered By: Wilkie Aye on 09/28/2018 16:08:49 [name], [name] ([id number]) -------------------------------------------------------------------------------- Physical Exam  Details Patient Name: [name], [name] Date of Service: 09/28/2018 2:45 PM Medical Record Number: [id number] Patient Account Number: [id number] Date of Birth/Sex: 02-14-1956 (62 y.o. M) Treating RN: Huel Coventry Primary Care Provider: Sindy Messing Other Clinician: Referring Provider: Sindy Messing Treating Provider/Extender: Wilkie Aye Weeks in Treatment: 17 Constitutional appears in no distress. Respiratory Respiratory effort is easy and symmetric bilaterally. Rate is normal at rest and on room air.. Cardiovascular Extremities are free of edema.. Integumentary (Hair, Skin) wound- sacrum. Notes 09/28/2018 sacral wound seems to be healing fairly well. He has some granulation to the outer border of the wound. Wound stills appears to be fairly fragile at this time we will continue with wound VAC in hopes of within the next couple weeks the wound VAC will be able to be removed. Electronic Signature(s) Signed: 10/02/2018 5:29:42 PM By: Wilkie Aye FNP-C Entered By: Wilkie Aye on 09/28/2018 16:12:07 [name], [name] ([id number]) -------------------------------------------------------------------------------- Physician Orders Details Patient Name: [name], [name] Date of Service: 09/28/2018 2:45 PM Medical Record Number: [id number] Patient Account Number: [id number] Date of Birth/Sex: 1956/05/12 (62 y.o. M) Treating RN: Huel Coventry Primary Care Provider: Sindy Messing Other Clinician: Referring Provider: Sindy Messing Treating Provider/Extender: Youlanda Roys in Treatment: 17 Verbal / Phone Orders: No Diagnosis Coding Wound Cleansing Wound #1 Right Gluteus o Clean wound with Normal Saline. o Cleanse wound with mild soap and water Anesthetic (add to Medication List) Wound #1 Right Gluteus o Topical Lidocaine 4% cream applied to wound bed prior to debridement (In Clinic Only). Skin Barriers/Peri-Wound Care Wound #1 Right Gluteus o Skin Prep Primary  Wound Dressing Wound #1 Right Gluteus o Saline moistened gauze - until wound vac reapplied at facility o Silver Collagen - silver collagen to undermining area above ischial tuberosity under NPWT foam Secondary Dressing Wound #1 Right Gluteus o ABD pad - 3rd layer in clinic only o Dry Gauze - 2nd layer in clinic only o Other - secure with Tegaderm or tape in clinic only Dressing Change Frequency Wound #1 Right Gluteus o Change Dressing Monday, Wednesday, Friday - NPWT Follow-up Appointments Wound #1 Right Gluteus o Return Appointment in 2 weeks. Off-Loading Wound #1 Right Gluteus o Mattress - Air mattress o Turn and reposition every 2 hours Additional Orders / Instructions Wound #1 Right Gluteus o Increase protein intake. o Other: - Pro-stat for wound healing twice daily. ZOHAR, MARONEY W. ([id number]) Negative Pressure Wound Therapy Wound #1 Right Gluteus o Wound VAC settings at 125/130 mmHg continuous pressure. Use BLACK/GREEN foam to wound cavity. Use WHITE foam to fill any tunnel/s and/or undermining. Change VAC dressing 3 X WEEK. Change canister as indicated when full. Nurse may titrate settings and frequency of dressing changes as clinically indicated. [facility] to reapply o Home Health Nurse may d/c [facility] for s/s of increased infection, significant wound regression, or uncontrolled drainage. [facility] at [phone]. Electronic Signature(s) Signed: 10/02/2018 5:29:42 PM By: Wilkie Aye FNP-C Entered By: Wilkie Aye on 09/28/2018 16:12:17 [name], [name] ([id number]) -------------------------------------------------------------------------------- Problem List Details Patient Name: [name], [name] Date of Service: 09/28/2018 2:45 PM Medical Record Number: [id number] Patient Account Number: [id number] Date of Birth/Sex: 01-08-1956 (62 y.o. M) Treating  RN: Huel Coventry Primary Care Provider: Sindy Messing  Other Clinician: Referring Provider: Sindy Messing Treating Provider/Extender: Wilkie Aye Weeks in Treatment: 17 Active Problems ICD-10 Evaluated Encounter Code Description Active Date Today Diagnosis L89.314 Pressure ulcer of right buttock, stage 4 06/01/2018 No Yes T81.31XD Disruption of external operation (surgical) wound, not 06/01/2018 No Yes elsewhere classified, subsequent encounter E43 Unspecified severe protein-calorie malnutrition 06/01/2018 No Yes Inactive Problems ICD-10 Code Description Active Date Inactive Date M86.18 Other acute osteomyelitis, other site 06/01/2018 06/01/2018 Resolved Problems Electronic Signature(s) Signed: 10/02/2018 5:29:42 PM By: Wilkie Aye FNP-C Entered By: Wilkie Aye on 09/28/2018 16:02:51 Loughney, Garison W. ([id number]) -------------------------------------------------------------------------------- Progress Note Details Patient Name: [name] Date of Service: 09/28/2018 2:45 PM Medical Record Number: [id number] Patient Account Number: [id number] Date of Birth/Sex: 24-Feb-1956 (62 y.o. M) Treating RN: Huel Coventry Primary Care Provider: Sindy Messing Other Clinician: Referring Provider: Sindy Messing Treating Provider/Extender: Youlanda Roys in Treatment: 17 Subjective Chief Complaint Information obtained from Patient right buttock stage 4 History of Present Illness (HPI) ADMISSION 06/01/18 This is a 62 year old man who is sent to [place] currently residing at [facility] skilled facility. He has a complicated recent medical history. His sister accompanies him tells [place] that he has been somewhat disabled since suffering a left basal ganglial CVA in 2018. At that point in time he also had myocardial infarctions. More recently he has been hospitalized from 04/04/18 through 04/12/18 with septic shock, coronary artery disease, congestive heart failure and an acute CVA. During this hospitalization he was noted to have sacral wounds  but these were not felt to be the source of infection however the source of the infection was never really determined at that point. I believe he was sent to a skilled facility. He reports that at the hospital on 05/09/18 through 05/18/18. At that point he was noted to have a large abscess of the right buttock. He required a surgical IandD by Dr. Gerrit Friends of general surgery. Culture of this grew Citrobacter,Eikenella and Bacteroides. He had a CT scan of the pelvis that showed an abnormal soft tissue collection just caudal to the right ischial tuberosity measuring 5.9 and by 4.7. This also involved the distal right deep muscles. There was some cortical bone loss on the right ischial tuberosity and there was some concern with osteomyelitis. He was felt to require treatment for osteomyelitis based on clinical and radiographic findings. He did not have an MRI. Past medical history includes; abdominal aortic aneurysm, carotid stenosis, hypertension, osteoarthritis, interstitial lung disease on chronic steroids, rheumatoid arthritis, congestive heart failure/ischemic cardiomyopathy with an ejection fraction of 35-40% Also noteworthy is on 05/09/18 his albumin was 2.2 on admission to hospital. I don't think this is actually been repeated. The patient states his appetite is good and he is taking supplements 100%. 06/15/18-He is seen in follow up vibration for right buttock ulcer; this is stable in appearance with minimal amount of non-viable tissue. 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 We've been using a wound VAC. This is centered over the right ischial tuberosity. He is making generally good progress. He was severely hypoalbuminemic at one time although he states that he eats well and takes his supplements. 07/06/18; 2 week follow-up for a large stage IV right buttock pressure  ulcer/surgical IandD site. We have been using silver collagen over the superior aspect of the right ischial tuberosity under  wound VAC.he has made some nice improvements. The dimensions of the wound are certainly smaller. He has no exposed bone. We received lab work from [facility]. His prealbumin was 18.6 and albumin at 2.57. This is obviously still low. I am not sure what interventions are in place. I know he is on supplements. I discussed this with the patient. I told him that the albumin represents already moderate to severe protein malnutrition. This would contribute it to do difficulty healing the wound 07/20/18; I received copies of lab work from [facility] on 07/06/18 documenting a sedimentation rate of 25 which seems to be in line with what I see from previous values however his C-reactive protein was 110.8 versus 84.7 on 06/22/18. I have looked through [facility] health Link I don't see a prior value for either inflammatory measure. he has been treated with [name] which I think he is still on for osteomyelitis of the right ischial tuberosity as well as a soft tissue collection just caudad to the right ischial tuberosity itself. Felt to have a soft tissue abscess and/or stigmata of necrotizing fasciitis. I'm not sure who is following this. His CT scan was on 05/09/18 yet he still remains on Invanz. Looking back through the original discharge summary the [name] was supposed to finish on 07/05/18. ARAMIS, CRITCHLOW ([id number]) 08/03/18 the patient continues to make good improvement. The area over the right ischial tuberosity has filled in nicely. His developed some what looks to be candidal skin infection in the periwound. I think they've taken the wound VAC off and are applying nystatin 08/17/18; 2 week follow-up for this resident from [facility]. They resumed the wound VAC. The area continues to fill in nicely. The only problem area is the tissue over the ischial tuberosity itself which needs to  come up a bit. For this reason I'm continuing the wound VAC. Otherwise this is doing remarkably well. The patient states that he is eating everything they give him although I don't see a follow-up albumin. 08/31/18; the patient states he had blood work done today. I do not have these results. He claims to be eating well. He continues with a wound VAC and the wound is making nice progress. 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 This is coming quite nicely he still has one Onalee Hua that has some depth but I think by the next time we see him in 2 weeks we should be able to move on from the wound VAC. He is still at skilled care at [facility]. As far as I am aware he is eating and drinking well. I have not seen follow-up albumin studies on him recently but the message I'm getting is that he is eating well and taking his supplements. The patient states he is not very mobile and I am hopeful that we are able to insure pressure relief here however if we hadn't been ensuring pressure relief I doubt this would've closed in as well as it has even with the wound VAC. He does not complain of any systemic issues 09/28/2018 for follow-up and management of stage IV sacral ulcer that is being managed with a wound VAC. He is a resident at a long-term care facility, [facility]. The wound continues to have some depth and width some granulation of tissue on the  outer borders of the wound. The wound was tender to touch today during exam. He denies any pain to the wound when it is not being touched. Recent fevers, chills, nausea, vomiting, or shortness of breath. Hopeful over the next couple of weeks he will be able to transition from the wound VAC. Patient History Information obtained from Patient. Social History Former smoker -  stopped Jan. 2018, Marital Status - Single, Alcohol Use - Rarely, Drug Use - No History, Caffeine Use - Daily. Medical And Surgical History Notes Cardiovascular Nov 02 2016 had stroke and 5 heart attacks in same day Review of Systems (ROS) Constitutional Symptoms (General Health) The patient has no complaints or symptoms. Respiratory The patient has no complaints or symptoms. Cardiovascular The patient has no complaints or symptoms. Integumentary (Skin) Complains or has symptoms of Wounds - sacrum. Objective Constitutional appears in no distress. FARHAN, JEAN. ([id number]) Vitals Time Taken: 2:55 PM, Height: 73 in, Weight: 206 lbs, BMI: 27.2, Temperature: 98.5 F, Pulse: 86 bpm, Respiratory Rate: 16 breaths/min, Blood Pressure: 102/67 mmHg. Respiratory Respiratory effort is easy and symmetric bilaterally. Rate is normal at rest and on room air.. Cardiovascular Extremities are free of edema.. General Notes: 09/28/2018 sacral wound seems to be healing fairly well. He has some granulation to the outer border of the wound. Wound stills appears to be fairly fragile at this time we will continue with wound VAC in hopes of within the next couple weeks the wound VAC will be able to be removed. Integumentary (Hair, Skin) wound- sacrum. Wound #1 status is Open. Original cause of wound was Gradually Appeared. The wound is located on the Right Gluteus. The wound measures 3.7cm length x 2.7cm width x 0.9cm depth; 7.846cm^2 area and 7.062cm^3 volume. There is bone, muscle, Fat Layer (Subcutaneous Tissue) Exposed, and fascia exposed. There is no tunneling or undermining noted. There is a medium amount of serosanguineous drainage noted. The wound margin is distinct with the outline attached to the wound base. There is large (67-100%) red, pink, hyper - granulation within the wound bed. There is a small (1-33%) amount of necrotic tissue within the wound bed including Adherent Slough. The  periwound skin appearance exhibited: Scarring. The periwound skin appearance did not exhibit: Callus, Crepitus, Excoriation, Induration, Rash, Dry/Scaly, Maceration, Atrophie Blanche, Cyanosis, Ecchymosis, Hemosiderin Staining, Mottled, Pallor, Rubor, Erythema. Periwound temperature was noted as No Abnormality. The periwound has tenderness on palpation. Assessment Active Problems ICD-10 Pressure ulcer of right buttock, stage 4 Disruption of external operation (surgical) wound, not elsewhere classified, subsequent encounter Unspecified severe protein-calorie malnutrition Plan Wound Cleansing: Wound #1 Right Gluteus: Clean wound with Normal Saline. Cleanse wound with mild soap and water Anesthetic (add to Medication List): Wound #1 Right Gluteus: Topical Lidocaine 4% cream applied to wound bed prior to debridement (In Clinic Only). Skin Barriers/Peri-Wound Care: Wound #1 Right Gluteus: Skin Prep Primary Wound Dressing: [name], [name] ([id number]) Wound #1 Right Gluteus: Saline moistened gauze - until wound vac reapplied at facility Silver Collagen - silver collagen to undermining area above ischial tuberosity under NPWT foam Secondary Dressing: Wound #1 Right Gluteus: ABD pad - 3rd layer in clinic only Dry Gauze - 2nd layer in clinic only Other - secure with Tegaderm or tape in clinic only Dressing Change Frequency: Wound #1 Right Gluteus: Change Dressing Monday, Wednesday, Friday - NPWT Follow-up Appointments: Wound #1 Right Gluteus: Return Appointment in 2 weeks. Off-Loading: Wound #1 Right Gluteus: Mattress - Air mattress Turn and reposition every 2 hours Additional Orders /  Instructions: Wound #1 Right Gluteus: Increase protein intake. Other: - Pro-stat for wound healing twice daily. Negative Pressure Wound Therapy: Wound #1 Right Gluteus: Wound VAC settings at 125/130 mmHg continuous pressure. Use BLACK/GREEN foam to wound cavity. Use WHITE foam to fill any  tunnel/s and/or undermining. Change VAC dressing 3 X WEEK. Change canister as indicated when full. Nurse may titrate settings and frequency of dressing changes as clinically indicated. [facility] to reapply Home Health Nurse [facility] for s/s of increased infection, significant wound regression, or uncontrolled drainage. Not[facility] at [phone]. Electronic Signature(s) Signed: 10/02/2018 5:29:42 PM By: Wilkie Aye FNP-C Entered By: Wilkie Aye on 09/28/2018 16:12:22 Sabina, [name] ([id number]) -------------------------------------------------------------------------------- ROS/PFSH Details Patient Name: ADONIS, YIM. Date of Service: 09/28/2018 2:45 PM Medical Record Number: [id number] Patient Account Number: [id number] Date of Birth/Sex: 05/20/1956 (62 y.o. M) Treating RN: Huel Coventry Primary Care Provider: Sindy Messing Other Clinician: Referring Provider: Sindy Messing Treating Provider/Extender: Wilkie Aye Weeks in Treatment: 17 Information Obtained From Patient Wound History Integumentary (Skin) Complaints and Symptoms: Positive for: Wounds - sacrum Medical History: Positive for: History of pressure wounds Negative for: History of Burn Constitutional Symptoms (General Health) Complaints and Symptoms: No Complaints or Symptoms Eyes Medical History: Positive for: Cataracts Negative for: Glaucoma; Optic Neuritis Ear/Nose/Mouth/Throat Medical History: Negative for: Chronic sinus problems/congestion; Middle ear problems Hematologic/Lymphatic Medical History: Negative for: Anemia; Hemophilia; Human Immunodeficiency Virus; Lymphedema; Sickle Cell Disease Respiratory Complaints and Symptoms: No Complaints or Symptoms Medical History: Negative for: Aspiration; Asthma; Chronic Obstructive Pulmonary Disease (COPD); Pneumothorax; Sleep Apnea; Tuberculosis Cardiovascular Complaints and Symptoms: No Complaints or  Symptoms Medical History: Positive for: Congestive Heart Failure; Coronary Artery Disease; Hypertension; Myocardial Infarction Mill, Hazael W. ([id number]) Negative for: Angina; Arrhythmia; Deep Vein Thrombosis; Hypotension; Peripheral Arterial Disease; Peripheral Venous Disease; Phlebitis; Vasculitis Past Medical History Notes: Nov 02 2016 had stroke and 5 heart attacks in same day Gastrointestinal Medical History: Negative for: Cirrhosis ; Colitis; Crohnos; Hepatitis A; Hepatitis B; Hepatitis C Endocrine Medical History: Negative for: Type I Diabetes; Type II Diabetes Genitourinary Medical History: Negative for: End Stage Renal Disease Immunological Medical History: Negative for: Lupus Erythematosus; Raynaudos; Scleroderma Musculoskeletal Medical History: Positive for: Rheumatoid Arthritis Negative for: Gout; Osteoarthritis; Osteomyelitis Neurologic Medical History: Negative for: Dementia; Neuropathy; Quadriplegia; Paraplegia; Seizure Disorder Oncologic Medical History: Negative for: Received Chemotherapy; Received Radiation Psychiatric Medical History: Negative for: Anorexia/bulimia; Confinement Anxiety HBO Extended History Items Eyes: Cataracts Immunizations Pneumococcal Vaccine: Received Pneumococcal Vaccination: Yes Tetanus Vaccine: Last tetanus shot: 11/02/2016 Implantable Devices Family and Social History IZIAH, CATES. ([id number]) Former smoker - stopped Jan. 2018; Marital Status - Single; Alcohol Use: Rarely; Drug Use: No History; Caffeine Use: Daily; Financial Concerns: No; Food, Clothing or Shelter Needs: No; Support System Lacking: No; Transportation Concerns: No; Advanced Directives: No; Patient does not want information on Advanced Directives; Do not resuscitate: No; Living Will: No; Medical Power of Attorney: Yes - Kathey Hopins- Sister (Not Provided) Physician Affirmation I have reviewed and agree with the above information. Electronic  Signature(s) Signed: 09/28/2018 4:25:08 PM By: Elliot Gurney, BSN, RN, CWS, Kim RN, BSN Signed: 10/02/2018 5:29:42 PM By: Wilkie Aye FNP-C Entered By: Wilkie Aye on 09/28/2018 16:09:23 KAYCEE, MCGAUGH ([id number]) -------------------------------------------------------------------------------- SuperBill Details Patient Name: KARAN, RAMNAUTH. Date of Service: 09/28/2018 Medical Record Number: [id number] Patient Account Number: [id number] Date of Birth/Sex: May 11, 1956 (62 y.o. M) Treating RN: Huel Coventry Primary Care Provider: Sindy Messing Other Clinician: Referring Provider: Sindy Messing Treating Provider/Extender: Terrilee Croak  Elliot Dally Weeks in Treatment: 17 Diagnosis Coding ICD-10 Codes Code Description L89.314 Pressure ulcer of right buttock, stage 4 T81.31XD Disruption of external operation (surgical) wound, not elsewhere classified, subsequent encounter E43 Unspecified severe protein-calorie malnutrition Facility Procedures CPT4 Code: 16109604 Description: [phone] - WOUND CARE VISIT-LEV 2 EST PT Modifier: Quantity: 1 Physician Procedures CPT4 Code: 1191478 Description: 99212 - WC PHYS LEVEL 2 - EST PT ICD-10 Diagnosis Description L89.314 Pressure ulcer of right buttock, stage 4 Modifier: Quantity: 1 Electronic Signature(s) Signed: 10/02/2018 5:29:42 PM By: Wilkie Aye FNP-C Entered By: Wilkie Aye on 09/28/2018 16:12:38

## 2018-10-19 ENCOUNTER — Encounter: Payer: Medicaid Other | Attending: Internal Medicine | Admitting: Internal Medicine

## 2018-10-19 DIAGNOSIS — I252 Old myocardial infarction: Secondary | ICD-10-CM | POA: Diagnosis not present

## 2018-10-19 DIAGNOSIS — I714 Abdominal aortic aneurysm, without rupture: Secondary | ICD-10-CM | POA: Insufficient documentation

## 2018-10-19 DIAGNOSIS — I2510 Atherosclerotic heart disease of native coronary artery without angina pectoris: Secondary | ICD-10-CM | POA: Insufficient documentation

## 2018-10-19 DIAGNOSIS — L89314 Pressure ulcer of right buttock, stage 4: Secondary | ICD-10-CM | POA: Insufficient documentation

## 2018-10-19 DIAGNOSIS — I255 Ischemic cardiomyopathy: Secondary | ICD-10-CM | POA: Diagnosis not present

## 2018-10-19 DIAGNOSIS — Z7952 Long term (current) use of systemic steroids: Secondary | ICD-10-CM | POA: Diagnosis not present

## 2018-10-19 DIAGNOSIS — T8131XD Disruption of external operation (surgical) wound, not elsewhere classified, subsequent encounter: Secondary | ICD-10-CM | POA: Diagnosis not present

## 2018-10-19 DIAGNOSIS — M069 Rheumatoid arthritis, unspecified: Secondary | ICD-10-CM | POA: Insufficient documentation

## 2018-10-19 DIAGNOSIS — E43 Unspecified severe protein-calorie malnutrition: Secondary | ICD-10-CM | POA: Diagnosis not present

## 2018-10-19 DIAGNOSIS — I110 Hypertensive heart disease with heart failure: Secondary | ICD-10-CM | POA: Insufficient documentation

## 2018-10-19 DIAGNOSIS — I509 Heart failure, unspecified: Secondary | ICD-10-CM | POA: Insufficient documentation

## 2018-10-19 DIAGNOSIS — M8580 Other specified disorders of bone density and structure, unspecified site: Secondary | ICD-10-CM | POA: Diagnosis not present

## 2018-10-20 NOTE — Progress Notes (Signed)
KODIE, KISHI ([id number]) Visit Report for 10/19/2018 HPI Details Patient Name: [name], [name] Date of Service: 10/19/2018 2:45 PM Medical Record Number: [id number] Patient Account Number: [id number] Date of Birth/Sex: [birth year] (62 y.o. M) Treating RN: Huel Coventry Primary Care Provider: Sindy Messing Other Clinician: Referring Provider: Sindy Messing Treating Provider/Extender: Altamese Mun[place] in Treatment: 20 History of Present Illness HPI Description: ADMISSION 06/01/18 This is a 62 year old man who is sent to [place] currently residing at [facility] skilled facility. He has a complicated recent medical history. His sister accompanies him tells [place] that he has been somewhat disabled since suffering a left basal ganglial CVA in 2018. At that point in time he also had myocardial infarctions. More recently he has been hospitalized from 04/04/18 through 04/12/18 with septic shock, coronary artery disease, congestive heart failure and an acute CVA. During this hospitalization he was noted to have sacral wounds but these were not felt to be the source of infection however the source of the infection was never really determined at that point. I believe he was sent to a skilled facility. He reports that at the hospital on 05/09/18 through 05/18/18. At that point he was noted to have a large abscess of the right buttock. He required a surgical IandD by Dr. Gerrit Friends of general surgery. Culture of this grew Citrobacter,Eikenella and Bacteroides. He had a CT scan of the pelvis that showed an abnormal soft tissue collection just caudal to the right ischial tuberosity measuring 5.9 and by 4.7. This also involved the distal right deep muscles. There was some cortical bone loss on the right ischial tuberosity and there was some concern with osteomyelitis. He was felt to require treatment for osteomyelitis based on clinical and radiographic findings. He did not have an MRI. Past medical history includes;  abdominal aortic aneurysm, carotid stenosis, hypertension, osteoarthritis, interstitial lung disease on chronic steroids, rheumatoid arthritis, congestive heart failure/ischemic cardiomyopathy with an ejection fraction of 35-40% Also noteworthy is on 05/09/18 his albumin was 2.2 on admission to hospital. I don't think this is actually been repeated. The patient states his appetite is good and he is taking supplements 100%. 06/15/18-He is seen in follow up vibration for right buttock ulcer; this is stable in appearance with minimal amount of non-viable tissue. 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 We've been using a wound VAC. This is centered over the right ischial tuberosity. He is making generally good progress. He was severely hypoalbuminemic at one time although he states that he eats well and takes his supplements. 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 We have been using silver collagen over the superior aspect of the right ischial tuberosity under wound VAC.he has made some nice improvements. The dimensions of the wound are certainly smaller. He has no exposed bone. We received lab work from [facility]. His prealbumin was 18.6 and albumin at 2.57. This is obviously still low. I am not sure what interventions are in place. I know he is on supplements. I discussed this with the patient. I told him that the albumin represents already moderate to severe protein malnutrition. This would contribute it to do difficulty healing the wound 07/20/18; I received copies of lab work from [facility] on 07/06/18 documenting a sedimentation rate of 25 which seems to be in line with what I see  from previous values however his C-reactive protein was 110.8 versus 84.7 on 06/22/18. I have looked through [facility] health Link I  don't see a prior value for either inflammatory measure. he has been treated with [name] which I think he is still on for osteomyelitis of the right ischial tuberosity as well as a soft tissue collection just caudad to the right ischial tuberosity itself. Felt to have a soft tissue abscess and/or stigmata of necrotizing fasciitis. I'm not sure who is following this. His CT scan was on 05/09/18 yet he still remains on Invanz. Looking back through the original discharge summary the [name] was supposed to finish on 07/05/18. 08/03/18 the patient continues to make good improvement. The area over the right ischial tuberosity has filled in nicely. His developed some what looks to be candidal skin infection in the periwound. I think they've taken the wound VAC off and are VIRAJ, LIBY. ([id number]) applying nystatin 08/17/18; 2 week follow-up for this resident from [facility]. They resumed the wound VAC. The area continues to fill in nicely. The only problem area is the tissue over the ischial tuberosity itself which needs to come up a bit. For this reason I'm continuing the wound VAC. Otherwise this is doing remarkably well. The patient states that he is eating everything they give him although I don't see a follow-up albumin. 08/31/18; the patient states he had blood work done today. I do not have these results. He claims to be eating well. He continues with a wound VAC and the wound is making nice progress. 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 This is coming quite nicely he still has one Onalee Hua that has some depth but I think by the next time we see him in 2 weeks we should be able to move on from the wound VAC. He is still at skilled care at [facility]. As far as I am aware he is eating and drinking well. I have  not seen follow-up albumin studies on him recently but the message I'm getting is that he is eating well and taking his supplements. The patient states he is not very mobile and I am hopeful that we are able to insure pressure relief here however if we hadn't been ensuring pressure relief I doubt this would've closed in as well as it has even with the wound VAC. He does not complain of any systemic issues 09/28/2018 for follow-up and management of stage IV sacral ulcer that is being managed with a wound VAC. He is a resident at a long-term care facility, [facility]. The wound continues to have some depth and width some granulation of tissue on the outer borders of the wound. The wound was tender to touch today during exam. He denies any pain to the wound when it is not being touched. Recent fevers, chills, nausea, vomiting, or shortness of breath. Hopeful over the next couple of weeks he will be able to transition from the wound VAC. 10/19/2018; this patient has a stage IV over the right ischial tuberosity. The wound is come to the surface of the skin and looks very healthy at this point. His wound VAC can be discontinued. The patient apparently is eating well taking his supplements. He has no systemic complaints Electronic Signature(s) Signed: 10/19/2018 5:55:34 PM By: Baltazar Najjar  MD Entered By: Baltazar Najjarobson, Rashan Rounsaville on 10/19/2018 15:35:53 Trela, [name] ([id number]) -------------------------------------------------------------------------------- Physical Exam Details Patient Name: Ryan CarrowMOORE, [name] Date of Service: 10/19/2018 2:45 PM Medical Record Number: [id number] Patient Account Number: [id number] Date of Birth/Sex: 10/31/1956 [age] y.o. M) Treating RN: Huel CoventryWoody, Kim Primary Care Provider: Sindy MessingGOMEZ, ROGER Other Clinician: Referring Provider: Sindy MessingGOMEZ, ROGER Treating Provider/Extender: Maxwell CaulOBSON, Bergen Melle G Weeks in Treatment: 20 Constitutional Sitting or standing Blood Pressure is within target  range for patient.. Pulse regular and within target range for patient.[name]. Respirations regular, non-labored and within target range.. Temperature is normal and within the target range for the patient.[name]. appears in no distress. Respiratory Respiratory effort is easy and symmetric bilaterally. Rate is normal at rest and on room air.. Cardiovascular Heart rhythm and rate regular, without murmur or gallop.. Gastrointestinal (GI) Abdomen is soft and non-distended without masses or tenderness. Bowel sounds active in all quadrants.. No liver or spleen enlargement or tenderness.. Notes Wound exam; area over the right ischial tuberosity. Healthy granulation. There is no depth to this wound now. The granulation is is at the level of her skin I think the wound VAC can discontinue at this point. There is no evidence of surrounding bacterial so cellulitis although there may be some candidal lesions well distant from the actual wound orifice. Electronic Signature(s) Signed: 10/19/2018 5:55:34 PM By: Baltazar Najjarobson, Carleena Mires MD Entered By: Baltazar Najjarobson, Nelissa Bolduc on 10/19/2018 15:41:34 [name], [name] ([id number]) -------------------------------------------------------------------------------- Physician Orders Details Patient Name: Ryan CarrowMOORE, [name] Date of Service: 10/19/2018 2:45 PM Medical Record Number: [id number] Patient Account Number: [id number] Date of Birth/Sex: 10/31/1956 [age] y.o. M) Treating RN: Huel CoventryWoody, Kim Primary Care Provider: Sindy MessingGOMEZ, ROGER Other Clinician: Referring Provider: Sindy MessingGOMEZ, ROGER Treating Provider/Extender: Altamese CarolinaOBSON, Weldon Nouri G Weeks in Treatment: 20 Verbal / Phone Orders: No Diagnosis Coding Wound Cleansing Wound #1 Right Gluteus o Clean wound with Normal Saline. Anesthetic (add to Medication List) Wound #1 Right Gluteus o Topical Lidocaine 4% cream applied to wound bed prior to debridement (In Clinic Only). Primary Wound Dressing Wound #1 Right Gluteus o Silver Alginate Secondary  Dressing Wound #1 Right Gluteus o Boardered Foam Dressing Dressing Change Frequency Wound #1 Right Gluteus o Change dressing every other day. Follow-up Appointments Wound #1 Right Gluteus o Return Appointment in 3 weeks. Off-Loading Wound #1 Right Gluteus o Turn and reposition every 2 hours Negative Pressure Wound Therapy Wound #1 Right Gluteus o Discontinue NPWT. Electronic Signature(s) Signed: 10/19/2018 5:32:05 PM By: Elliot GurneyWoody, BSN, RN, CWS, Kim RN, BSN Signed: 10/19/2018 5:55:34 PM By: Baltazar Najjarobson, Casidee Jann MD Entered By: Elliot GurneyWoody, BSN, RN, CWS, Kim on 10/19/2018 15:11:56 Ryan CarrowMOORE, [name] ([id number]) -------------------------------------------------------------------------------- Problem List Details Patient Name: Ryan CarrowMOORE, Ahnaf W. Date of Service: 10/19/2018 2:45 PM Medical Record Number: [id number] Patient Account Number: [id number] Date of Birth/Sex: 10/31/1956 30(62 y.o. M) Treating RN: Huel CoventryWoody, Kim Primary Care Provider: Sindy MessingGOMEZ, ROGER Other Clinician: Referring Provider: Sindy MessingGOMEZ, ROGER Treating Provider/Extender: Altamese CarolinaOBSON, Othella Slappey G Weeks in Treatment: 20 Active Problems ICD-10 Evaluated Encounter Code Description Active Date Today Diagnosis L89.314 Pressure ulcer of right buttock, stage 4 06/01/2018 No Yes T81.31XD Disruption of external operation (surgical) wound, not 06/01/2018 No Yes elsewhere classified, subsequent encounter E43 Unspecified severe protein-calorie malnutrition 06/01/2018 No Yes Inactive Problems ICD-10 Code Description Active Date Inactive Date M86.18 Other acute osteomyelitis, other site 06/01/2018 06/01/2018 Resolved Problems Electronic Signature(s) Signed: 10/19/2018 5:55:34 PM By: Baltazar Najjarobson, Noemy Hallmon MD Entered By: Baltazar Najjarobson, Salma Walrond on 10/19/2018 15:33:27 [name], [name] ([id number]) -------------------------------------------------------------------------------- Progress Note Details Patient Name: Ryan CarrowMOORE, [name] Date of Service: 10/19/2018 2:45  PM  Medical Record Number: [id number] Patient Account Number: [id number] Date of Birth/Sex: 11/24/1955 (62 y.o. M) Treating RN: Huel Coventry Primary Care Provider: Sindy Messing Other Clinician: Referring Provider: Sindy Messing Treating Provider/Extender: Altamese [place]inford in Treatment: 20 Subjective History of Present Illness (HPI) ADMISSION 06/01/18 This is a 62 year old man who is sent to [place] currently residing at [facility] skilled facility. He has a complicated recent medical history. His sister accompanies him tells [place] that he has been somewhat disabled since suffering a left basal ganglial CVA in 2018. At that point in time he also had myocardial infarctions. More recently he has been hospitalized from 04/04/18 through 04/12/18 with septic shock, coronary artery disease, congestive heart failure and an acute CVA. During this hospitalization he was noted to have sacral wounds but these were not felt to be the source of infection however the source of the infection was never really determined at that point. I believe he was sent to a skilled facility. He reports that at the hospital on 05/09/18 through 05/18/18. At that point he was noted to have a large abscess of the right buttock. He required a surgical IandD by Dr. Gerrit Friends of general surgery. Culture of this grew Citrobacter,Eikenella and Bacteroides. He had a CT scan of the pelvis that showed an abnormal soft tissue collection just caudal to the right ischial tuberosity measuring 5.9 and by 4.7. This also involved the distal right deep muscles. There was some cortical bone loss on the right ischial tuberosity and there was some concern with osteomyelitis. He was felt to require treatment for osteomyelitis based on clinical and radiographic findings. He did not have an MRI. Past medical history includes; abdominal aortic aneurysm, carotid stenosis, hypertension, osteoarthritis, interstitial lung disease on chronic steroids, rheumatoid  arthritis, congestive heart failure/ischemic cardiomyopathy with an ejection fraction of 35-40% Also noteworthy is on 05/09/18 his albumin was 2.2 on admission to hospital. I don't think this is actually been repeated. The patient states his appetite is good and he is taking supplements 100%. 06/15/18-He is seen in follow up vibration for right buttock ulcer; this is stable in appearance with minimal amount of non-viable tissue. 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 We've been using a wound VAC. This is centered over the right ischial tuberosity. He is making generally good progress. He was severely hypoalbuminemic at one time although he states that he eats well and takes his supplements. 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 We have been using silver collagen over the superior aspect of the right ischial tuberosity under wound VAC.he has made some nice improvements. The dimensions of the wound are certainly smaller. He has no exposed bone. We received lab work from [facility]. His prealbumin was 18.6 and albumin at 2.57. This is obviously still low. I am not sure what interventions are in place. I know he is on supplements. I discussed this with the patient. I told him that the albumin represents already moderate to severe protein malnutrition. This would contribute it to do difficulty healing the wound 07/20/18; I received copies of lab work from [facility] on 07/06/18 documenting a sedimentation rate of 25 which seems to be in line with what I see from previous values however his C-reactive protein was 110.8 versus 84.7 on 06/22/18. I have looked through [facility] health Link I  don't see a prior value for either inflammatory measure. he has been treated with [name] which I think he is still on for osteomyelitis  of the right ischial tuberosity as well as a soft tissue collection just caudad to the right ischial tuberosity itself. Felt to have a soft tissue abscess and/or stigmata of necrotizing fasciitis. I'm not sure who is following this. His CT scan was on 05/09/18 yet he still remains on Invanz. Looking back through the original discharge summary the [name] was supposed to finish on 07/05/18. 08/03/18 the patient continues to make good improvement. The area over the right ischial tuberosity has filled in nicely. His developed some what looks to be candidal skin infection in the periwound. I think they've taken the wound VAC off and are applying nystatin 08/17/18; 2 week follow-up for this resident from [facility]. They resumed the wound VAC. The area continues to fill in nicely. The only problem area is the tissue over the ischial tuberosity itself which needs to come up a bit. For this reason [name], [name] ([id number]) continuing the wound VAC. Otherwise this is doing remarkably well. The patient states that he is eating everything they give him although I don't see a follow-up albumin. 08/31/18; the patient states he had blood work done today. I do not have these results. He claims to be eating well. He continues with a wound VAC and the wound is making nice progress. 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 This is coming quite nicely he still has one Onalee Hua that has some depth but I think by the next time we see him in 2 weeks we should be able to move on from the wound VAC. He is still at skilled care at [facility]. As far as I am aware he is eating and drinking well. I have not seen follow-up albumin studies on him recently but the message I'm getting is that he is eating well and taking his supplements. The  patient states he is not very mobile and I am hopeful that we are able to insure pressure relief here however if we hadn't been ensuring pressure relief I doubt this would've closed in as well as it has even with the wound VAC. He does not complain of any systemic issues 09/28/2018 for follow-up and management of stage IV sacral ulcer that is being managed with a wound VAC. He is a resident at a long-term care facility, [facility]. The wound continues to have some depth and width some granulation of tissue on the outer borders of the wound. The wound was tender to touch today during exam. He denies any pain to the wound when it is not being touched. Recent fevers, chills, nausea, vomiting, or shortness of breath. Hopeful over the next couple of weeks he will be able to transition from the wound VAC. 10/19/2018; this patient has a stage IV over the right ischial tuberosity. The wound is come to the surface of the skin and looks very healthy at this point. His wound VAC can be discontinued. The patient apparently is eating well taking his supplements. He has no systemic complaints Objective Constitutional Sitting or standing Blood Pressure is within target range for patient.. Pulse regular and within target range for patient.[name] Respirations regular, non-labored and within target range.. Temperature is  normal and within the target range for the patient.[name] appears in no distress. Vitals Time Taken: 2:47 PM, Height: 73 in, Weight: 206 lbs, BMI: 27.2, Temperature: 97.6 F, Pulse: 81 bpm, Respiratory Rate: 16 breaths/min, Blood Pressure: 111/65 mmHg. Respiratory Respiratory effort is easy and symmetric bilaterally. Rate is normal at rest and on room air.. Cardiovascular Heart rhythm and rate regular, without murmur or gallop.. Gastrointestinal (GI) Abdomen is soft and non-distended without masses or tenderness. Bowel sounds active in all quadrants.. No liver or spleen enlargement or  tenderness.. General Notes: Wound exam; area over the right ischial tuberosity. Healthy granulation. There is no depth to this wound now. The granulation is is at the level of her skin I think the wound VAC can discontinue at this point. There is no evidence of surrounding bacterial so cellulitis although there may be some candidal lesions well distant from the actual wound orifice. Integumentary (Hair, Skin) Wound #1 status is Open. Original cause of wound was Gradually Appeared. The wound is located on the Right Gluteus. The wound measures 3.5cm length x 2.5cm width x 0.5cm depth; 6.872cm^2 area and 3.436cm^3 volume. There is bone, muscle, [name], [name] ([id number]) Fat Layer (Subcutaneous Tissue) Exposed, and fascia exposed. There is a medium amount of serosanguineous drainage noted. The wound margin is distinct with the outline attached to the wound base. There is medium (34-66%) red, pink, hyper - granulation within the wound bed. There is a medium (34-66%) amount of necrotic tissue within the wound bed including Adherent Slough. The periwound skin appearance exhibited: Scarring. The periwound skin appearance did not exhibit: Callus, Crepitus, Excoriation, Induration, Rash, Dry/Scaly, Maceration, Atrophie Blanche, Cyanosis, Ecchymosis, Hemosiderin Staining, Mottled, Pallor, Rubor, Erythema. Periwound temperature was noted as No Abnormality. The periwound has tenderness on palpation. Assessment Active Problems ICD-10 Pressure ulcer of right buttock, stage 4 Disruption of external operation (surgical) wound, not elsewhere classified, subsequent encounter Unspecified severe protein-calorie malnutrition Plan Wound Cleansing: Wound #1 Right Gluteus: Clean wound with Normal Saline. Anesthetic (add to Medication List): Wound #1 Right Gluteus: Topical Lidocaine 4% cream applied to wound bed prior to debridement (In Clinic Only). Primary Wound Dressing: Wound #1 Right Gluteus: Silver  Alginate Secondary Dressing: Wound #1 Right Gluteus: Boardered Foam Dressing Dressing Change Frequency: Wound #1 Right Gluteus: Change dressing every other day. Follow-up Appointments: Wound #1 Right Gluteus: Return Appointment in 3 weeks. Off-Loading: Wound #1 Right Gluteus: Turn and reposition every 2 hours Negative Pressure Wound Therapy: Wound #1 Right Gluteus: Discontinue NPWT. 1. Change the primary dressing from the wound VAC to silver alginate with border foam dressing 2. Continue to offload this aggressively. The patient states he eats and drinks well 3. Follow-up in 3 weeks [name], [name] ([id number]) Electronic Signature(s) Signed: 10/19/2018 5:55:34 PM By: Baltazar Najjar MD Entered By: Baltazar Najjar on 10/19/2018 15:42:27 [name], [name] ([id number]) -------------------------------------------------------------------------------- SuperBill Details Patient Name: [name] Date of Service: 10/19/2018 Medical Record Number: [id number] Patient Account Number: [id number] Date of Birth/Sex: 07/06/1956 (62 y.o. M) Treating RN: Huel Coventry Primary Care Provider: Sindy Messing Other Clinician: Referring Provider: Sindy Messing Treating Provider/Extender: Altamese [place] in Treatment: 20 Diagnosis Coding ICD-10 Codes Code Description L89.314 Pressure ulcer of right buttock, stage 4 T81.31XD Disruption of external operation (surgical) wound, not elsewhere classified, subsequent encounter E43 Unspecified severe protein-calorie malnutrition Facility Procedures CPT4 Code: [id number] Description: 54650 - WOUND CARE VISIT-LEV 2 EST PT Modifier: Quantity: 1 Physician Procedures CPT4: Description Modifier Quantity Code [id number] - WC PHYS LEVEL  3 - EST PT 1 ICD-10 Diagnosis Description L89.314 Pressure ulcer of right buttock, stage 4 T81.31XD Disruption of external operation (surgical) wound, not elsewhere classified,  subsequent encounter E43 Unspecified  severe protein-calorie malnutrition Electronic Signature(s) Signed: 10/19/2018 5:55:34 PM By: Baltazar Najjar MD Entered By: Baltazar Najjar on 10/19/2018 15:42:49

## 2018-10-21 NOTE — Progress Notes (Signed)
RUSTY, VILLELLA ([id number]) Visit Report for 10/19/2018 Arrival Information Details Patient Name: [name], [name] Date of Service: 10/19/2018 2:45 PM Medical Record Number: [id number] Patient Account Number: [id number] Date of Birth/Sex: 12/28/1955 (62 y.o. M) Treating [name]: [name] Primary Care [name]: [name] Other Clinician: Referring [name]: [name] Treating [name]/Extender: [name] in Treatment: 20 Visit Information History Since Last Visit Added or deleted any medications: No Patient Arrived: Stretcher Any new allergies or adverse reactions: No Arrival Time: 14:46 Had a fall or experienced change in No Accompanied By: EMS and activities of daily living that may affect sister risk of falls: Transfer Assistance: Manual Signs or symptoms of abuse/neglect since last visito No Patient Requires Transmission-Based No Hospitalized since last visit: No Precautions: Implantable device outside of the clinic excluding No Patient Has Alerts: No cellular tissue based products placed in the center since last visit: Has Dressing in Place as Prescribed: Yes Pain Present Now: No Electronic Signature(s) Signed: 10/20/2018 5:04:36 PM By: [name] RCP, RRT, CHT Entered By: [name] on 10/19/2018 14:46:45 [name], [name] ([id number]) -------------------------------------------------------------------------------- Clinic Level of Care Assessment Details Patient Name: [name], [name] Date of Service: 10/19/2018 2:45 PM Medical Record Number: [id number] Patient Account Number: [id number] Date of Birth/Sex: July 27, 1956 (62 y.o. M) Treating [name]: [name] Primary Care Jonnelle Lawniczak: [name] Other Clinician: Referring [name]: [name] Treating [name]/Extender: [name] in Treatment: 20 Clinic Level of Care Assessment Items TOOL 4 Quantity Score []  - Use when only an EandM is performed on  FOLLOW-UP visit 0 ASSESSMENTS - Nursing Assessment / Reassessment []  - Reassessment of Co-morbidities (includes updates in patient status) 0 X- 1 5 Reassessment of Adherence to Treatment Plan ASSESSMENTS - Wound and Skin Assessment / Reassessment X - Simple Wound Assessment / Reassessment - one wound 1 5 []  - 0 Complex Wound Assessment / Reassessment - multiple wounds []  - 0 Dermatologic / Skin Assessment (not related to wound area) ASSESSMENTS - Focused Assessment []  - Circumferential Edema Measurements - multi extremities 0 []  - 0 Nutritional Assessment / Counseling / Intervention []  - 0 Lower Extremity Assessment (monofilament, tuning fork, pulses) []  - 0 Peripheral Arterial Disease Assessment (using hand held doppler) ASSESSMENTS - Ostomy and/or Continence Assessment and Care []  - Incontinence Assessment and Management 0 []  - 0 Ostomy Care Assessment and Management (repouching, etc.) PROCESS - Coordination of Care X - Simple Patient / Family Education for ongoing care 1 15 []  - 0 Complex (extensive) Patient / Family Education for ongoing care []  - 0 Staff obtains , Records, Test Results / Process Orders []  - 0 Staff telephones HHA, Nursing Homes / Clarify orders / etc []  - 0 Routine Transfer to another Facility (non-emergent condition) []  - 0 Routine Hospital Admission (non-emergent condition) []  - 0 New Admissions / / Ordering NPWT, Apligraf, etc. []  - 0 Emergency Hospital Admission (emergent condition) X- 1 10 Simple Discharge Coordination [name], [name] ( ) []  - 0 Complex (extensive) Discharge Coordination PROCESS - Special Needs []  - Pediatric / Minor Patient Management 0 []  - 0 Isolation Patient Management []  - 0 Hearing / Language / Visual special needs []  - 0 Assessment of Community assistance (transportation, D/C planning, etc.) []  - 0 Additional assistance / Altered mentation []  - 0 Support Surface(s)  Assessment (bed, cushion, seat, etc.) INTERVENTIONS - Wound Cleansing / Measurement X - Simple Wound Cleansing - one wound 1 5 []  - 0 Complex Wound Cleansing - multiple  wounds X- 1 5 Wound Imaging (photographs - any number of wounds) []  - 0 Wound Tracing (instead of photographs) X- 1 5 Simple Wound Measurement - one wound []  - 0 Complex Wound Measurement - multiple wounds INTERVENTIONS - Wound Dressings []  - Small Wound Dressing one or multiple wounds 0 X- 1 15 Medium Wound Dressing one or multiple wounds []  - 0 Large Wound Dressing one or multiple wounds []  - 0 Application of Medications - topical []  - 0 Application of Medications - injection INTERVENTIONS - Miscellaneous []  - External ear exam 0 []  - 0 Specimen Collection (cultures, biopsies, blood, body fluids, etc.) []  - 0 Specimen(s) / Culture(s) sent or taken to Lab for analysis []  - 0 Patient Transfer (multiple staff / Nurse, adult / Similar devices) []  - 0 Simple Staple / Suture removal (25 or less) []  - 0 Complex Staple / Suture removal (26 or more) []  - 0 Hypo / Hyperglycemic Management (close monitor of Blood Glucose) []  - 0 Ankle / Brachial Index (ABI) - do not check if billed separately X- 1 5 Vital Signs [name], [name] ([id number]) Has the patient been seen at the hospital within the last three years: Yes Total Score: 70 Level Of Care: New/Established - Level 2 Electronic Signature(s) Signed: 10/19/2018 5:32:05 PM By: [name], BSN, [name], CWS, [name], BSN Entered By: [name], BSN, [name], CWS, Ryan on 10/19/2018 15:12:31 [name], [name] ([id number]) -------------------------------------------------------------------------------- Encounter Discharge Information Details Patient Name: [name] Date of Service: 10/19/2018 2:45 PM Medical Record Number: [id number] Patient Account Number: [id number] Date of Birth/Sex: 12-May-1956 (62 y.o. M) Treating [name]: [name] Primary Care [name]: [name] Other  Clinician: Referring [name]: [name] Treating [name]/Extender: Ryan [place] in Treatment: 20 Encounter Discharge Information Items Discharge Condition: Stable Ambulatory Status: Stretcher Discharge Destination: Skilled Nursing Facility Orders Sent: Yes Transportation: Ambulance Accompanied By: sister and EMS Schedule Follow-up Appointment: Yes Clinical Summary of Care: Electronic Signature(s) Signed: 10/19/2018 4:43:50 PM By: [name] Entered By: [name] on 10/19/2018 15:23:21 Verdi, [name] ([id number]) -------------------------------------------------------------------------------- Lower Extremity Assessment Details Patient Name: [name] Date of Service: 10/19/2018 2:45 PM Medical Record Number: [id number] Patient Account Number: [id number] Date of Birth/Sex: 1956/09/20 (62 y.o. M) Treating [name]: [name] Primary Care Oluwatobi Ruppe: [name] Other Clinician: Referring Gracin Mcpartland: [name] Treating Alvin Rubano/Extender: Ryan [place] in Treatment: 20 Electronic Signature(s) Signed: 10/19/2018 5:32:05 PM By: [name], BSN, [name], CWS, [name], BSN Entered By: [name], BSN, [name], CWS, Ryan on 10/19/2018 15:09:43 Pankow, [name] ([id number]) -------------------------------------------------------------------------------- Multi Wound Chart Details Patient Name: [name], [name] Date of Service: 10/19/2018 2:45 PM Medical Record Number: [id number] Patient Account Number: [id number] Date of Birth/Sex: Nov 02, 1956 (62 y.o. M) Treating [name]: [name] Primary Care Josanne Boerema: [name] Other Clinician: Referring Olubunmi Rothenberger: [name] Treating Zaakirah Kistner/Extender: Ryan [place] in Treatment: 20 Vital Signs Height(in): 73 Pulse(bpm): 81 Weight(lbs): 206 Blood Pressure(mmHg): 111/65 Body Mass Index(BMI): 27 Temperature(F): 97.6 Respiratory Rate 16 (breaths/min): Photos: [N/A:N/A] Wound Location: Right Gluteus N/A N/A Wounding  Event: Gradually Appeared N/A N/A Primary Etiology: Pressure Ulcer N/A N/A Comorbid History: Cataracts, Congestive Heart N/A N/A Failure, Coronary Artery Disease, Hypertension, Myocardial Infarction, History of pressure wounds, Rheumatoid Arthritis Date Acquired: 05/09/2018 N/A N/A Weeks of Treatment: 20 N/A N/A Wound Status: Open N/A N/A Measurements L x W x D 3.5x2.5x0.5 N/A N/A (cm) Area (cm) : 8.979 N/A N/A Volume (cm) : 3.436 N/A N/A % Reduction in Area: 94.40% N/A N/A % Reduction  in Volume: 99.40% N/A N/A Classification: Category/Stage IV N/A N/A Exudate Amount: Medium N/A N/A Exudate Type: Serosanguineous N/A N/A Exudate Color: red, brown N/A N/A Wound Margin: Distinct, outline attached N/A N/A Granulation Amount: Medium (34-66%) N/A N/A Granulation Quality: Red, Pink, Hyper-granulation N/A N/A Necrotic Amount: Medium (34-66%) N/A N/A Exposed Structures: Fascia: Yes N/A N/A Fat Layer (Subcutaneous Tissue) Exposed: Yes Mira, Jaeshaun W. ([id number]) Muscle: Yes Bone: Yes Epithelialization: None N/A N/A Periwound Skin Texture: Scarring: Yes N/A N/A Excoriation: No Induration: No Callus: No Crepitus: No Rash: No Periwound Skin Moisture: Maceration: No N/A N/A Dry/Scaly: No Periwound Skin Color: Atrophie Blanche: No N/A N/A Cyanosis: No Ecchymosis: No Erythema: No Hemosiderin Staining: No Mottled: No Pallor: No Rubor: No Temperature: No Abnormality N/A N/A Tenderness on Palpation: Yes N/A N/A Wound Preparation: Ulcer Cleansing: N/A N/A Rinsed/Irrigated with Saline Topical Anesthetic Applied: Other: lidocaine 4% Treatment Notes Electronic Signature(s) Signed: 10/19/2018 5:55:34 PM By: Baltazar Najjar MD Entered By: Baltazar Najjar on 10/19/2018 15:34:12 Fenech, [name] ([id number]) -------------------------------------------------------------------------------- Multi-Disciplinary Care Plan Details Patient Name: [name], [name] Date of Service:  10/19/2018 2:45 PM Medical Record Number: [id number] Patient Account Number: [id number] Date of Birth/Sex: [birth year] (62 y.o. M) Treating [name]: [name] Primary Care Schylar Wuebker: [name] Other Clinician: Referring Fantasha Daniele: [name] Treating Briggette Najarian/Extender: Ryan [place] in Treatment: 20 Active Inactive Necrotic Tissue Nursing Diagnoses: Knowledge deficit related to management of necrotic/devitalized tissue Goals: Necrotic/devitalized tissue will be minimized in the wound bed Date Initiated: 06/15/2018 Target Resolution Date: 06/29/2018 Goal Status: Active Interventions: Assess patient pain level pre-, during and post procedure and prior to discharge Treatment Activities: Apply topical anesthetic as ordered : 06/15/2018 Excisional debridement : 06/15/2018 Notes: Orientation to the Wound Care Program Nursing Diagnoses: Knowledge deficit related to the wound healing center program Goals: Patient/caregiver will verbalize understanding of the Wound Healing Center Program Date Initiated: 06/15/2018 Target Resolution Date: 07/01/2018 Goal Status: Active Interventions: Provide education on orientation to the wound center Notes: Pressure Nursing Diagnoses: Knowledge deficit related to management of pressures ulcers Goals: Patient will remain free from development of additional pressure ulcers Date Initiated: 06/15/2018 Target Resolution Date: 07/01/2018 ALERIC, FROELICH ([id number]) Goal Status: Active Interventions: Assess: immobility, friction, shearing, incontinence upon admission and as needed Notes: Wound/Skin Impairment Nursing Diagnoses: Impaired tissue integrity Goals: Ulcer/skin breakdown will have a volume reduction of 80% by week 12 Date Initiated: 06/15/2018 Target Resolution Date: 09/15/2018 Goal Status: Active Interventions: Assess ulceration(s) every visit Treatment Activities: Skin care regimen initiated : 06/15/2018 Notes: Electronic  Signature(s) Signed: 10/19/2018 5:32:05 PM By: [name], BSN, [name], CWS, [name], BSN Entered By: [name], BSN, [name], CWS, Ryan on 10/19/2018 15:09:47 Moyano, [name] ([id number]) -------------------------------------------------------------------------------- Pain Assessment Details Patient Name: [name], [name] Date of Service: 10/19/2018 2:45 PM Medical Record Number: [id number] Patient Account Number: [id number] Date of Birth/Sex: [birth year] (62 y.o. M) Treating [name]: [name] Primary Care Oshua Mcconaha: [name] Other Clinician: Referring Deyanira Fesler: [name] Treating Alastair Hennes/Extender: Ryan [place] in Treatment: 20 Active Problems Location of Pain Severity and Description of Pain Patient Has Paino No Site Locations Pain Management and Medication Current Pain Management: Electronic Signature(s) Signed: 10/19/2018 5:32:05 PM By: [name], BSN, [name], CWS, [name], BSN Signed: 10/20/2018 5:04:36 PM By: [name] RCP, RRT, CHT Entered By: [name] on 10/19/2018 14:46:55 Trigo, [name] ([id number]) -------------------------------------------------------------------------------- Patient/Caregiver Education Details Patient Name: [name], [name] Date of Service: 10/19/2018 2:45 PM Medical Record Number: [id number] Patient Account Number: [id number] Date of  Birth/Gender: [birth year] (62 y.o. M) Treating [name]: [name] Primary Care Physician: [name] Other Clinician: Referring Physician: Sindy [name] Treating Physician/Extender: Ryan [place] in Treatment: 20 Education Assessment Education Provided To: Patient Education Topics Provided Pressure: Handouts: Pressure Ulcers: Care and Offloading Methods: Demonstration, Explain/Verbal Responses: State content correctly Wound/Skin Impairment: Handouts: Caring for Your Ulcer Methods: Demonstration, Explain/Verbal Responses: State content correctly Electronic  Signature(s) Signed: 10/19/2018 4:43:50 PM By: [name] Entered By: [name] on 10/19/2018 15:23:34 Steib, [name] ([id number]) -------------------------------------------------------------------------------- Wound Assessment Details Patient Name: [name] Date of Service: 10/19/2018 2:45 PM Medical Record Number: [id number] Patient Account Number: [id number] Date of Birth/Sex: February 17, 1956 (62 y.o. M) Treating [name]: [name] Primary Care Carr Shartzer: [name] Other Clinician: Referring Dameion Briles: [name] Treating Sayla Golonka/Extender: Maxwell Caul Weeks in Treatment: 20 Wound Status Wound Number: 1 Primary Pressure Ulcer Etiology: Wound Location: Right Gluteus Wound Open Wounding Event: Gradually Appeared Status: Date Acquired: 05/09/2018 Comorbid Cataracts, Congestive Heart Failure, Coronary Weeks Of Treatment: 20 History: Artery Disease, Hypertension, Myocardial Clustered Wound: No Infarction, History of pressure wounds, Rheumatoid Arthritis Photos Photo Uploaded By: [name] on 10/19/2018 15:05:35 Wound Measurements Length: (cm) 3.5 Width: (cm) 2.5 Depth: (cm) 0.5 Area: (cm) 6.872 Volume: (cm) 3.436 % Reduction in Area: 94.4% % Reduction in Volume: 99.4% Epithelialization: None Wound Description Classification: Category/Stage IV Foul Odo Wound Margin: Distinct, outline attached Slough/F Exudate Amount: Medium Exudate Type: Serosanguineous Exudate Color: red, brown r After Cleansing: No ibrino Yes Wound Bed Granulation Amount: Medium (34-66%) Exposed Structure Granulation Quality: Red, Pink, Hyper-granulation Fascia Exposed: Yes Necrotic Amount: Medium (34-66%) Fat Layer (Subcutaneous Tissue) Exposed: Yes Necrotic Quality: Adherent Slough Muscle Exposed: Yes Necrosis of Muscle: No Bone Exposed: Yes Periwound Skin Texture [name], [name] ([id number]) Texture Color No Abnormalities Noted: No No Abnormalities Noted: No Callus:  No Atrophie Blanche: No Crepitus: No Cyanosis: No Excoriation: No Ecchymosis: No Induration: No Erythema: No Rash: No Hemosiderin Staining: No Scarring: Yes Mottled: No Pallor: No Moisture Rubor: No No Abnormalities Noted: No Dry / Scaly: No Temperature / Pain Maceration: No Temperature: No Abnormality Tenderness on Palpation: Yes Wound Preparation Ulcer Cleansing: Rinsed/Irrigated with Saline Topical Anesthetic Applied: Other: lidocaine 4%, Electronic Signature(s) Signed: 10/19/2018 4:43:50 PM By: [name] Entered By: [name] on 10/19/2018 15:01:02 Ohmer, [name] ([id number]) -------------------------------------------------------------------------------- Vitals Details Patient Name: [name] Date of Service: 10/19/2018 2:45 PM Medical Record Number: [id number] Patient Account Number: [id number] Date of Birth/Sex: [birth year] (62 y.o. M) Treating [name]: [name] Primary Care Stephen Turnbaugh: [name] Other Clinician: Referring Kwame Ryland: [name] Treating Edrei Norgaard/Extender: Ryan [place] in Treatment: 20 Vital Signs Time Taken: 14:47 Temperature (F): 97.6 Height (in): 73 Pulse (bpm): 81 Weight (lbs): 206 Respiratory Rate (breaths/min): 16 Body Mass Index (BMI): 27.2 Blood Pressure (mmHg): 111/65 Reference Range: 80 - 120 mg / dl Electronic Signature(s) Signed: 10/20/2018 5:04:36 PM By: [name] RCP, RRT, CHT Entered By: [name] on 10/19/2018 14:49:18

## 2018-11-09 ENCOUNTER — Ambulatory Visit: Payer: Medicaid Other | Admitting: Internal Medicine

## 2018-11-23 ENCOUNTER — Encounter: Payer: Medicaid Other | Attending: Internal Medicine | Admitting: Internal Medicine

## 2018-11-23 DIAGNOSIS — Z8673 Personal history of transient ischemic attack (TIA), and cerebral infarction without residual deficits: Secondary | ICD-10-CM | POA: Diagnosis not present

## 2018-11-23 DIAGNOSIS — I509 Heart failure, unspecified: Secondary | ICD-10-CM | POA: Diagnosis not present

## 2018-11-23 DIAGNOSIS — I110 Hypertensive heart disease with heart failure: Secondary | ICD-10-CM | POA: Diagnosis not present

## 2018-11-23 DIAGNOSIS — Z7952 Long term (current) use of systemic steroids: Secondary | ICD-10-CM | POA: Insufficient documentation

## 2018-11-23 DIAGNOSIS — E43 Unspecified severe protein-calorie malnutrition: Secondary | ICD-10-CM | POA: Insufficient documentation

## 2018-11-23 DIAGNOSIS — M069 Rheumatoid arthritis, unspecified: Secondary | ICD-10-CM | POA: Diagnosis not present

## 2018-11-23 DIAGNOSIS — I714 Abdominal aortic aneurysm, without rupture: Secondary | ICD-10-CM | POA: Insufficient documentation

## 2018-11-23 DIAGNOSIS — M8580 Other specified disorders of bone density and structure, unspecified site: Secondary | ICD-10-CM | POA: Insufficient documentation

## 2018-11-23 DIAGNOSIS — I252 Old myocardial infarction: Secondary | ICD-10-CM | POA: Insufficient documentation

## 2018-11-23 DIAGNOSIS — X58XXXA Exposure to other specified factors, initial encounter: Secondary | ICD-10-CM | POA: Insufficient documentation

## 2018-11-23 DIAGNOSIS — I2510 Atherosclerotic heart disease of native coronary artery without angina pectoris: Secondary | ICD-10-CM | POA: Insufficient documentation

## 2018-11-23 DIAGNOSIS — L89314 Pressure ulcer of right buttock, stage 4: Secondary | ICD-10-CM | POA: Diagnosis present

## 2018-11-23 DIAGNOSIS — T8131XD Disruption of external operation (surgical) wound, not elsewhere classified, subsequent encounter: Secondary | ICD-10-CM | POA: Insufficient documentation

## 2018-11-23 DIAGNOSIS — I255 Ischemic cardiomyopathy: Secondary | ICD-10-CM | POA: Diagnosis not present

## 2018-11-25 NOTE — Progress Notes (Signed)
ALDRIN, ENGELHARD ([id number]) Visit Report for 11/23/2018 HPI Details Patient Name: [name], [name] Date of Service: 11/23/2018 2:30 PM Medical Record Number: [id number] Patient Account Number: [id number] Date of Birth/Sex: 1955-11-15 (63 y.o. M) Treating RN: Huel Coventry Primary Care Provider: Sindy Messing Other Clinician: Referring Provider: Sindy Messing Treating Provider/Extender: Altamese [place] in Treatment: 25 History of Present Illness HPI Description: ADMISSION 06/01/18 This is a 63 year old man who is sent to [place] currently residing at [facility] skilled facility. He has a complicated recent medical history. His sister accompanies him tells [place] that he has been somewhat disabled since suffering a left basal ganglial CVA in 2018. At that point in time he also had myocardial infarctions. More recently he has been hospitalized from 04/04/18 through 04/12/18 with septic shock, coronary artery disease, congestive heart failure and an acute CVA. During this hospitalization he was noted to have sacral wounds but these were not felt to be the source of infection however the source of the infection was never really determined at that point. I believe he was sent to a skilled facility. He reports that at the hospital on 05/09/18 through 05/18/18. At that point he was noted to have a large abscess of the right buttock. He required a surgical IandD by Dr. Gerrit Friends of general surgery. Culture of this grew Citrobacter,Eikenella and Bacteroides. He had a CT scan of the pelvis that showed an abnormal soft tissue collection just caudal to the right ischial tuberosity measuring 5.9 and by 4.7. This also involved the distal right deep muscles. There was some cortical bone loss on the right ischial tuberosity and there was some concern with osteomyelitis. He was felt to require treatment for osteomyelitis based on clinical and radiographic findings. He did not have an MRI. Past medical history includes;  abdominal aortic aneurysm, carotid stenosis, hypertension, osteoarthritis, interstitial lung disease on chronic steroids, rheumatoid arthritis, congestive heart failure/ischemic cardiomyopathy with an ejection fraction of 35-40% Also noteworthy is on 05/09/18 his albumin was 2.2 on admission to hospital. I don't think this is actually been repeated. The patient states his appetite is good and he is taking supplements 100%. 06/15/18-He is seen in follow up vibration for right buttock ulcer; this is stable in appearance with minimal amount of non-viable tissue. 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 We've been using a wound VAC. This is centered over the right ischial tuberosity. He is making generally good progress. He was severely hypoalbuminemic at one time although he states that he eats well and takes his supplements. 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 We have been using silver collagen over the superior aspect of the right ischial tuberosity under wound VAC.he has made some nice improvements. The dimensions of the wound are certainly smaller. He has no exposed bone. We received lab work from [facility]. His prealbumin was 18.6 and albumin at 2.57. This is obviously still low. I am not sure what interventions are in place. I know he is on supplements. I discussed this with the patient. I told him that the albumin represents already moderate to severe protein malnutrition. This would contribute it to do difficulty healing the wound 07/20/18; I received copies of lab work from [facility] on 07/06/18 documenting a sedimentation rate of 25 which seems to be in line with what I see  from previous values however his C-reactive protein was 110.8 versus 84.7 on 06/22/18. I have looked through [facility] health Link I  don't see a prior value for either inflammatory measure. he has been treated with [name] which I think he is still on for osteomyelitis of the right ischial tuberosity as well as a soft tissue collection just caudad to the right ischial tuberosity itself. Felt to have a soft tissue abscess and/or stigmata of necrotizing fasciitis. I'm not sure who is following this. His CT scan was on 05/09/18 yet he still remains on Invanz. Looking back through the original discharge summary the [name] was supposed to finish on 07/05/18. 08/03/18 the patient continues to make good improvement. The area over the right ischial tuberosity has filled in nicely. His developed some what looks to be candidal skin infection in the periwound. I think they've taken the wound VAC off and are [name], [name] ([id number]) applying nystatin 08/17/18; 2 week follow-up for this resident from [facility]. They resumed the wound VAC. The area continues to fill in nicely. The only problem area is the tissue over the ischial tuberosity itself which needs to come up a bit. For this reason I'm continuing the wound VAC. Otherwise this is doing remarkably well. The patient states that he is eating everything they give him although I don't see a follow-up albumin. 08/31/18; the patient states he had blood work done today. I do not have these results. He claims to be eating well. He continues with a wound VAC and the wound is making nice progress. 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 This is coming quite nicely he still has one Onalee Hua that has some depth but I think by the next time we see him in 2 weeks we should be able to move on from the wound VAC. He is still at skilled care at [facility]. As far as I am aware he is eating and drinking well. I have  not seen follow-up albumin studies on him recently but the message I'm getting is that he is eating well and taking his supplements. The patient states he is not very mobile and I am hopeful that we are able to insure pressure relief here however if we hadn't been ensuring pressure relief I doubt this would've closed in as well as it has even with the wound VAC. He does not complain of any systemic issues 09/28/2018 for follow-up and management of stage IV sacral ulcer that is being managed with a wound VAC. He is a resident at a long-term care facility, [facility]. The wound continues to have some depth and width some granulation of tissue on the outer borders of the wound. The wound was tender to touch today during exam. He denies any pain to the wound when it is not being touched. Recent fevers, chills, nausea, vomiting, or shortness of breath. Hopeful over the next couple of weeks he will be able to transition from the wound VAC. 10/19/2018; this patient has a stage IV over the right ischial tuberosity. The wound is come to the surface of the skin and looks very healthy at this point. His wound VAC can be discontinued. The patient apparently is eating well taking his supplements. He has no systemic complaints 11/23/2018. The patient has a stage IV wound over  his right ischial tuberosity that is now down to a quarter sized superficial area. There is no depth to this. The tissue looks healthy. No debridement was required. Continued improvement. We are using silver alginate and bordered foam Electronic Signature(s) Signed: 11/23/2018 5:53:07 PM By: Baltazar Najjar MD Entered By: Baltazar Najjar on 11/23/2018 17:16:13 [name], [name] ([id number]) -------------------------------------------------------------------------------- Physical Exam Details Patient Name: [name], [name] Date of Service: 11/23/2018 2:30 PM Medical Record Number: [id number] Patient Account Number: [id number] Date of  Birth/Sex: 05-Jan-1956 (63 y.o. M) Treating RN: Huel Coventry Primary Care Provider: Sindy Messing Other Clinician: Referring Provider: Sindy Messing Treating Provider/Extender: Maxwell Caul Weeks in Treatment: 25 Constitutional Sitting or standing Blood Pressure is within target range for patient.. Pulse regular and within target range for patient.[name] Respirations regular, non-labored and within target range.. Temperature is normal and within the target range for the patient.[name] appears in no distress. Eyes Conjunctivae clear. No discharge. Respiratory Respiratory effort is easy and symmetric bilaterally. Rate is normal at rest and on room air.. Gastrointestinal (GI) Abdomen is soft and non-distended without masses or tenderness. Bowel sounds active in all quadrants.. No liver or spleen enlargement or tenderness.. Integumentary (Hair, Skin) No primary skin lesions are seen. Notes Wound exam; area over the right ischial tuberosity has healthy granulation there is no depth of this wound. No evidence of surrounding infection. Electronic Signature(s) Signed: 11/23/2018 5:53:07 PM By: Baltazar Najjar MD Entered By: Baltazar Najjar on 11/23/2018 17:17:29 Credeur, [name] ([id number]) -------------------------------------------------------------------------------- Physician Orders Details Patient Name: BRAIDAN, RICCIARDI. Date of Service: 11/23/2018 2:30 PM Medical Record Number: [id number] Patient Account Number: [id number] Date of Birth/Sex: 1956-03-24 (63 y.o. M) Treating RN: Huel Coventry Primary Care Provider: Sindy Messing Other Clinician: Referring Provider: Sindy Messing Treating Provider/Extender: Altamese [place] in Treatment: 25 Verbal / Phone Orders: No Diagnosis Coding Wound Cleansing Wound #1 Right Gluteus o Clean wound with Normal Saline. Anesthetic (add to Medication List) Wound #1 Right Gluteus o Topical Lidocaine 4% cream applied to wound bed prior to debridement  (In Clinic Only). Primary Wound Dressing Wound #1 Right Gluteus o Silver Alginate Secondary Dressing Wound #1 Right Gluteus o Boardered Foam Dressing Dressing Change Frequency Wound #1 Right Gluteus o Change dressing every other day. Follow-up Appointments Wound #1 Right Gluteus o Return Appointment in 3 weeks. Off-Loading Wound #1 Right Gluteus o Turn and reposition every 2 hours Negative Pressure Wound Therapy Wound #1 Right Gluteus o Discontinue NPWT. Electronic Signature(s) Signed: 11/23/2018 5:53:07 PM By: Baltazar Najjar MD Signed: 11/24/2018 5:43:44 PM By: Elliot Gurney, BSN, RN, CWS, Kim RN, BSN Entered By: Elliot Gurney, BSN, RN, CWS, Kim on 11/23/2018 15:06:12 [name], [name] ([id number]) -------------------------------------------------------------------------------- Problem List Details Patient Name: [name], [name] Date of Service: 11/23/2018 2:30 PM Medical Record Number: [id number] Patient Account Number: [id number] Date of Birth/Sex: September 10, 1956 (63 y.o. M) Treating RN: Huel Coventry Primary Care Provider: Sindy Messing Other Clinician: Referring Provider: Sindy Messing Treating Provider/Extender: Altamese [place] in Treatment: 25 Active Problems ICD-10 Evaluated Encounter Code Description Active Date Today Diagnosis L89.314 Pressure ulcer of right buttock, stage 4 06/01/2018 No Yes T81.31XD Disruption of external operation (surgical) wound, not 06/01/2018 No Yes elsewhere classified, subsequent encounter E43 Unspecified severe protein-calorie malnutrition 06/01/2018 No Yes Inactive Problems ICD-10 Code Description Active Date Inactive Date M86.18 Other acute osteomyelitis, other site 06/01/2018 06/01/2018 Resolved Problems Electronic Signature(s) Signed: 11/23/2018 5:53:07 PM By: Baltazar Najjar MD Entered By: Baltazar Najjar on 11/23/2018 17:14:57 Rochelle, Cowen W.  ([id number]) -------------------------------------------------------------------------------- Progress  Note Details Patient Name: [name], [name] Date of Service: 11/23/2018 2:30 PM Medical Record Number: [id number] Patient Account Number: [id number] Date of Birth/Sex: June 14, 1956 [age] y.o. M) Treating RN: Huel CoventryWoody, Kim Primary Care Provider: Sindy MessingGOMEZ, ROGER Other Clinician: Referring Provider: Sindy MessingGOMEZ, ROGER Treating Provider/Extender: Altamese CarolinaOBSON, Jamall Strohmeier G Weeks in Treatment: 25 Subjective History of Present Illness (HPI) ADMISSION 06/01/18 This is a 63 year old man who is sent to us currently residing [facility]. He has a complicated recent medical history. His sister accompanies him tells us that he has been somewhat disabled since suffering a left basal ganglial CVA in 2018. At that point in time he also had myocardial infarctions. More recently he has been hospitalized from 04/04/18 through 04/12/18 with septic shock, coronary artery disease, congestive heart failure and an acute CVA. During this hospitalization he was noted to have sacral wounds but these were not felt to be the source of infection however the source of the infection was never really determined at that point. I believe he was sent to a skilled facility. He reports that at the hospital on 05/09/18 through 05/18/18. At that point he was noted to have a large abscess of the right buttock. He required a surgical IandD by Dr. Gerrit FriendsGerkin of general surgery. Culture of this grew Citrobacter,Eikenella and Bacteroides. He had a CT scan of the pelvis that showed an abnormal soft tissue collection just caudal to the right ischial tuberosity measuring 5.9 and by 4.7. This also involved the distal right deep muscles. There was some cortical bone loss on the right ischial tuberosity and there was some concern with osteomyelitis. He was felt to require treatment for osteomyelitis based on clinical and radiographic findings. He did not  have an MRI. Past medical history includes; abdominal aortic aneurysm, carotid stenosis, hypertension, osteoarthritis, interstitial lung disease on chronic steroids, rheumatoid arthritis, congestive heart failure/ischemic cardiomyopathy with an ejection fraction of 35-40% Also noteworthy is on 05/09/18 his albumin was 2.2 on admission to hospital. I don't think this is actually been repeated. The patient states his appetite is good and he is taking supplements 100%. 06/15/18-He is seen in follow up vibration for right buttock ulcer; this is stable in appearance with minimal amount of non-viable tissue. 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 We've been using a wound VAC. This is centered over the right ischial tuberosity. He is making generally good progress. He was severely hypoalbuminemic at one time although he states that he eats well and takes his supplements. 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 We have been using silver collagen over the superior aspect of the right ischial tuberosity under wound VAC.he has made some nice improvements. The dimensions of the wound are certainly smaller. He has no exposed bone. We received lab work from [facility]. His prealbumin was 18.6 and albumin at 2.57. This is obviously still low. I am not sure what interventions are in place. I know he is on supplements. I discussed this with the patient. I told him that the albumin represents already moderate to severe protein malnutrition. This would contribute it to do difficulty healing the wound 07/20/18; I received copies of lab work from [facility] on 07/06/18 documenting a sedimentation rate of 25 which seems to be in line with what I see from previous values however his C-reactive protein was  110.8 versus 84.7 on 06/22/18.  [facility] I don't see a prior value for either inflammatory measure. he has been treated with Pincus SanesInvanz which I think he is still on for osteomyelitis of the right ischial tuberosity as well as a soft tissue collection just caudad to the right ischial tuberosity itself. Felt to have a soft tissue abscess and/or stigmata of necrotizing fasciitis. I'm not sure who is following this. His CT scan was on 05/09/18 yet he still remains on Invanz. Looking back through the original discharge summary the Pincus Sanesnvanz was supposed to finish on 07/05/18. 08/03/18 the patient continues to make good improvement. The area over the right ischial tuberosity has filled in nicely. His developed some what looks to be candidal skin infection in the periwound. I think they've taken the wound VAC off and are applying nystatin 08/17/18; 2 week [facility]. They resumed the wound VAC. The area continues to fill in nicely. The only problem area is the tissue over the ischial tuberosity itself which needs to come up a bit. For this reason Lazaro Arms'm Holberg, Mang W. ([id number]) continuing the wound VAC. Otherwise this is doing remarkably well. The patient states that he is eating everything they give him although I don't see a follow-up albumin. 08/31/18; the patient states he had blood work done today. I do not have these results. He claims to be eating well. He continues with a wound VAC and the wound is making nice progress. 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 This is coming quite nicely he still has one Onalee HuaDavid that has some depth but I think by the next time we see him in 2 weeks we should be able to move on from the wound VAC. He is still at [facility]. As far as I am aware  he is eating and drinking well. I have not seen follow-up albumin studies on him recently but the message I'm getting is that he is eating well and taking his supplements. The patient states he is not very mobile and I am hopeful that we are able to insure pressure relief here however if we hadn't been ensuring pressure relief I doubt this would've closed in as well as it has even with the wound VAC. He does not complain of any systemic issues 09/28/2018 for follow-up and management of stage IV sacral ulcer that is being managed with a wound VAC. He is a resident at a [facility] care facility[facility]. The wound continues to have some depth and width some granulation of tissue on the outer borders of the wound. The wound was tender to touch today during exam. He denies any pain to the wound when it is not being touched. Recent fevers, chills, nausea, vomiting, or shortness of breath. Hopeful over the next couple of weeks he will be able to transition from the wound VAC. 10/19/2018; this patient has a stage IV over the right ischial tuberosity. The wound is come to the surface of the skin and looks very healthy at this point. His wound VAC can be discontinued. The patient apparently is eating well taking his supplements. He has no systemic complaints 11/23/2018. The patient has a stage IV wound over his right ischial tuberosity that is now down  to a quarter sized superficial area. There is no depth to this. The tissue looks healthy. No debridement was required. Continued improvement. We are using silver alginate and bordered foam Objective Constitutional Sitting or standing Blood Pressure is within target range for patient.. Pulse regular and within target range for patient.[name] Respirations regular, non-labored and within target range.. Temperature is normal and within the target range for the patient.[name] appears in no distress. Vitals Time Taken: 2:35 PM, Height: 73 in, Weight: 206 lbs, BMI:  27.2, Temperature: 97.8 F, Pulse: 99 bpm, Respiratory Rate: 16 breaths/min, Blood Pressure: 117/75 mmHg. Eyes Conjunctivae clear. No discharge. Respiratory Respiratory effort is easy and symmetric bilaterally. Rate is normal at rest and on room air.. Gastrointestinal (GI) Abdomen is soft and non-distended without masses or tenderness. Bowel sounds active in all quadrants.. No liver or spleen enlargement or tenderness.. General Notes: Wound exam; area over the right ischial tuberosity has healthy granulation there is no depth of this wound. No evidence of surrounding infection. Integumentary (Hair, Skin) Silba, Jairo W. ([id number]) No primary skin lesions are seen. Wound #1 status is Open. Original cause of wound was Gradually Appeared. The wound is located on the Right Gluteus. The wound measures 2cm length x 1.5cm width x 0.2cm depth; 2.356cm^2 area and 0.471cm^3 volume. There is bone, muscle, Fat Layer (Subcutaneous Tissue) Exposed, and fascia exposed. There is no tunneling or undermining noted. There is a small amount of serous drainage noted. The wound margin is distinct with the outline attached to the wound base. There is no granulation within the wound bed. There is a medium (34-66%) amount of necrotic tissue within the wound bed including Adherent Slough. The periwound skin appearance exhibited: Scarring. The periwound skin appearance did not exhibit: Callus, Crepitus, Excoriation, Induration, Rash, Dry/Scaly, Maceration, Atrophie Blanche, Cyanosis, Ecchymosis, Hemosiderin Staining, Mottled, Pallor, Rubor, Erythema. Periwound temperature was noted as No Abnormality. The periwound has tenderness on palpation. Assessment Active Problems ICD-10 Pressure ulcer of right buttock, stage 4 Disruption of external operation (surgical) wound, not elsewhere classified, subsequent encounter Unspecified severe protein-calorie malnutrition Plan Wound Cleansing: Wound #1 Right  Gluteus: Clean wound with Normal Saline. Anesthetic (add to Medication List): Wound #1 Right Gluteus: Topical Lidocaine 4% cream applied to wound bed prior to debridement (In Clinic Only). Primary Wound Dressing: Wound #1 Right Gluteus: Silver Alginate Secondary Dressing: Wound #1 Right Gluteus: Boardered Foam Dressing Dressing Change Frequency: Wound #1 Right Gluteus: Change dressing every other day. Follow-up Appointments: Wound #1 Right Gluteus: Return Appointment in 3 weeks. Off-Loading: Wound #1 Right Gluteus: Turn and reposition every 2 hours Negative Pressure Wound Therapy: Wound #1 Right Gluteus: Discontinue NPWT. [name], [name] ([id number]) 1. There is absolutely no need to change the dressing here. Silver alginate and border foam. 2. He is appearing quite healthy. Generally much better looking than when I first started looking after him. I think he is put on some weight. Electronic Signature(s) Signed: 11/23/2018 5:53:07 PM By: Baltazar Najjar MD Entered By: Baltazar Najjar on 11/23/2018 17:18:35 Ambrosius, [name] ([id number]) -------------------------------------------------------------------------------- SuperBill Details Patient Name: [name], [name] Date of Service: 11/23/2018 Medical Record Number: [id number] Patient Account Number: [id number] Date of Birth/Sex: 1956/08/23 (63 y.o. M) Treating RN: Huel Coventry Primary Care Provider: Sindy Messing Other Clinician: Referring Provider: Sindy Messing Treating Provider/Extender: Altamese [place] in Treatment: 25 Diagnosis Coding ICD-10 Codes Code Description L89.314 Pressure ulcer of right buttock, stage 4 T81.31XD Disruption of external operation (surgical) wound, not elsewhere classified, subsequent encounter E43 Unspecified severe  protein-calorie malnutrition Physician Procedures CPT4 Code: [id number] Description: 99213 - WC PHYS LEVEL 3 - EST PT ICD-10 Diagnosis Description L89.314 Pressure ulcer of  right buttock, stage 4 E43 Unspecified severe protein-calorie malnutrition Modifier: Quantity: 1 Electronic Signature(s) Signed: 11/23/2018 5:53:07 PM By: Baltazar Najjar MD Entered By: Baltazar Najjar on 11/23/2018 17:19:00

## 2018-11-25 NOTE — Progress Notes (Signed)
[name], [name] ([id number]) Visit Report for 11/23/2018 Arrival Information Details Patient Name: [name], [name] Date of Service: 11/23/2018 2:30 PM Medical Record Number: [id number] Patient Account Number: [id number] Date of Birth/Sex: 1956-08-08 (64 y.o. M) Treating [name]: [name] Primary Care [name]: Sindy [name] Other Clinician: Referring [name]: Sindy [name] Treating [name]/Extender: [name] in Treatment: 25 Visit Information History Since Last Visit Added or deleted any medications: No Patient Arrived: Wheel Chair Any new allergies or adverse reactions: No Arrival Time: 14:56 Had a fall or experienced change in No activities of daily living that may affect Accompanied By: self risk of falls: Transfer Assistance: Hoyer Lift Signs or symptoms of abuse/neglect since last visito No Patient Identification Verified: Yes Hospitalized since last visit: No Secondary Verification Process Completed: Yes Implantable device outside of the clinic excluding No Patient Requires Transmission-Based No cellular tissue based products placed in the center Precautions: since last visit: Patient Has Alerts: No Has Dressing in Place as Prescribed: Yes Pain Present Now: No Electronic Signature(s) Signed: 11/23/2018 4:29:26 PM By: [name] Entered By: [name] on 11/23/2018 14:56:53 Traxler, Raylyn W. ([id number]) -------------------------------------------------------------------------------- Clinic Level of Care Assessment Details Patient Name: [name] Date of Service: 11/23/2018 2:30 PM Medical Record Number: [id number] Patient Account Number: [id number] Date of Birth/Sex: 02/27/1956 (63 y.o. M) Treating [name]: [name] Primary Care Amere Bricco: Sindy [name] Other Clinician: Referring [name]: Sindy [name] Treating Alpa Salvo/Extender: [name] in Treatment: 25 Clinic Level of Care Assessment Items TOOL 4 Quantity Score []  - Use when only an  EandM is performed on FOLLOW-UP visit 0 ASSESSMENTS - Nursing Assessment / Reassessment []  - Reassessment of Co-morbidities (includes updates in patient status) 0 X- 1 5 Reassessment of Adherence to Treatment Plan ASSESSMENTS - Wound and Skin Assessment / Reassessment X - Simple Wound Assessment / Reassessment - one wound 1 5 []  - 0 Complex Wound Assessment / Reassessment - multiple wounds []  - 0 Dermatologic / Skin Assessment (not related to wound area) ASSESSMENTS - Focused Assessment []  - Circumferential Edema Measurements - multi extremities 0 []  - 0 Nutritional Assessment / Counseling / Intervention []  - 0 Lower Extremity Assessment (monofilament, tuning fork, pulses) []  - 0 Peripheral Arterial Disease Assessment (using hand held doppler) ASSESSMENTS - Ostomy and/or Continence Assessment and Care []  - Incontinence Assessment and Management 0 []  - 0 Ostomy Care Assessment and Management (repouching, etc.) PROCESS - Coordination of Care X - Simple Patient / Family Education for ongoing care 1 15 []  - 0 Complex (extensive) Patient / Family Education for ongoing care []  - 0 Staff obtains Chiropractor, Records, Test Results / Process Orders []  - 0 Staff telephones HHA, Nursing Homes / Clarify orders / etc []  - 0 Routine Transfer to another Facility (non-emergent condition) []  - 0 Routine Hospital Admission (non-emergent condition) []  - 0 New Admissions / Manufacturing engineer / Ordering NPWT, Apligraf, etc. []  - 0 Emergency Hospital Admission (emergent condition) X- 1 10 Simple Discharge Coordination [name], [name] ([id number]) []  - 0 Complex (extensive) Discharge Coordination PROCESS - Special Needs []  - Pediatric / Minor Patient Management 0 []  - 0 Isolation Patient Management []  - 0 Hearing / Language / Visual special needs []  - 0 Assessment of Community assistance (transportation, D/C planning, etc.) []  - 0 Additional assistance / Altered mentation []  -  0 Support Surface(s) Assessment (bed, cushion, seat, etc.) INTERVENTIONS - Wound Cleansing / Measurement X - Simple Wound Cleansing - one wound 1 5 []  - 0 Complex  Wound Cleansing - multiple wounds X- 1 5 Wound Imaging (photographs - any number of wounds) []  - 0 Wound Tracing (instead of photographs) X- 1 5 Simple Wound Measurement - one wound []  - 0 Complex Wound Measurement - multiple wounds INTERVENTIONS - Wound Dressings []  - Small Wound Dressing one or multiple wounds 0 X- 1 15 Medium Wound Dressing one or multiple wounds []  - 0 Large Wound Dressing one or multiple wounds []  - 0 Application of Medications - topical []  - 0 Application of Medications - injection INTERVENTIONS - Miscellaneous []  - External ear exam 0 []  - 0 Specimen Collection (cultures, biopsies, blood, body fluids, etc.) []  - 0 Specimen(s) / Culture(s) sent or taken to Lab for analysis X- 1 10 Patient Transfer (multiple staff / Nurse, adult / Similar devices) []  - 0 Simple Staple / Suture removal (25 or less) []  - 0 Complex Staple / Suture removal (26 or more) []  - 0 Hypo / Hyperglycemic Management (close monitor of Blood Glucose) []  - 0 Ankle / Brachial Index (ABI) - do not check if billed separately X- 1 5 Vital Signs [name], [name] ([id number]) Has the patient been seen at the hospital within the last three years: Yes Total Score: 80 Level Of Care: New/Established - Level 3 Electronic Signature(s) Signed: 11/24/2018 5:43:44 PM By: [name], BSN, [name], CWS, [name], BSN Entered By: [name], BSN, [name], CWS, Ryan on 11/24/2018 12:45:35 [name], [name] ([id number]) -------------------------------------------------------------------------------- Lower Extremity Assessment Details Patient Name: [name] Date of Service: 11/23/2018 2:30 PM Medical Record Number: [id number] Patient Account Number: [id number] Date of Birth/Sex: 1956-07-25 (63 y.o. M) Treating [name]: [name] Primary Care Monzerrat Wellen:  Sindy [name] Other Clinician: Referring Christyn Gutkowski: Sindy [name] Treating Amarah Brossman/Extender: [name] Weeks in Treatment: 25 Electronic Signature(s) Signed: 11/23/2018 4:29:26 PM By: [name] Entered By: [name] on 11/23/2018 14:58:13 [name], [name] ([id number]) -------------------------------------------------------------------------------- Multi Wound Chart Details Patient Name: CAPOZZA, [name] Date of Service: 11/23/2018 2:30 PM Medical Record Number: [id number] Patient Account Number: [id number] Date of Birth/Sex: Mar 14, 1956 (63 y.o. M) Treating [name]: [name] Primary Care Rya Rausch: Sindy [name] Other Clinician: Referring Alexandre Faries: Sindy [name] Treating Maleko Greulich/Extender: Ryan [place] in Treatment: 25 Vital Signs Height(in): 73 Pulse(bpm): 99 Weight(lbs): 206 Blood Pressure(mmHg): 117/75 Body Mass Index(BMI): 27 Temperature(F): 97.8 Respiratory Rate 16 (breaths/min): Photos: [N/A:N/A] Wound Location: Right Gluteus N/A N/A Wounding Event: Gradually Appeared N/A N/A Primary Etiology: Pressure Ulcer N/A N/A Comorbid History: Cataracts, Congestive Heart N/A N/A Failure, Coronary Artery Disease, Hypertension, Myocardial Infarction, History of pressure wounds, Rheumatoid Arthritis Date Acquired: 05/09/2018 N/A N/A Weeks of Treatment: 25 N/A N/A Wound Status: Open N/A N/A Measurements L x W x D 2x1.5x0.2 N/A N/A (cm) Area (cm) : 2.356 N/A N/A Volume (cm) : 0.471 N/A N/A % Reduction in Area: 98.10% N/A N/A % Reduction in Volume: 99.90% N/A N/A Classification: Category/Stage IV N/A N/A Exudate Amount: Small N/A N/A Exudate Type: Serous N/A N/A Exudate Color: amber N/A N/A Wound Margin: Distinct, outline attached N/A N/A Granulation Amount: None Present (0%) N/A N/A Necrotic Amount: Medium (34-66%) N/A N/A Exposed Structures: Fascia: Yes N/A N/A Fat Layer (Subcutaneous Tissue) Exposed: Yes Pio, Missael W. ([id number]) Muscle: Yes Bone:  Yes Epithelialization: None N/A N/A Periwound Skin Texture: Scarring: Yes N/A N/A Excoriation: No Induration: No Callus: No Crepitus: No Rash: No Periwound Skin Moisture: Maceration: No N/A N/A Dry/Scaly: No Periwound Skin Color: Atrophie Blanche: No N/A N/A Cyanosis: No Ecchymosis: No Erythema: No Hemosiderin Staining: No  Mottled: No Pallor: No Rubor: No Temperature: No Abnormality N/A N/A Tenderness on Palpation: Yes N/A N/A Wound Preparation: Ulcer Cleansing: N/A N/A Rinsed/Irrigated with Saline Topical Anesthetic Applied: Other: lidocaine 4% Treatment Notes Electronic Signature(s) Signed: 11/23/2018 5:53:07 PM By: Baltazar Najjarobson, Michael MD Entered By: Baltazar Najjarobson, Michael on 11/23/2018 17:15:07 [name], [name] ([id number]) -------------------------------------------------------------------------------- Multi-Disciplinary Care Plan Details Patient Name: [name], [name] Date of Service: 11/23/2018 2:30 PM Medical Record Number: [id number] Patient Account Number: [id number] Date of Birth/Sex: July 31, 1956 [age] y.o. M) Treating [name]: Ryan CoventryWoody, Ryan Primary Care Chanah Tidmore: Sindy MessingGOMEZ, ROGER Other Clinician: Referring Delailah Spieth: Sindy MessingGOMEZ, ROGER Treating Lafawn Lenoir/Extender: Ryan CarolinaOBSON, MICHAEL G Weeks in Treatment: 25 Active Inactive Necrotic Tissue Nursing Diagnoses: Knowledge deficit related to management of necrotic/devitalized tissue Goals: Necrotic/devitalized tissue will be minimized in the wound bed Date Initiated: 06/15/2018 Target Resolution Date: 06/29/2018 Goal Status: Active Interventions: Assess patient pain level pre-, during and post procedure and prior to discharge Treatment Activities: Apply topical anesthetic as ordered : 06/15/2018 Excisional debridement : 06/15/2018 Notes: Orientation to the Wound Care Program Nursing Diagnoses: Knowledge deficit related to the wound healing center program Goals: Patient/caregiver will verbalize understanding of the Wound Healing Center  Program Date Initiated: 06/15/2018 Target Resolution Date: 07/01/2018 Goal Status: Active Interventions: Provide education on orientation to the wound center Notes: Pressure Nursing Diagnoses: Knowledge deficit related to management of pressures ulcers Goals: Patient will remain free from development of additional pressure ulcers Date Initiated: 06/15/2018 Target Resolution Date: 07/01/2018 [name], [name] ([id number]) Goal Status: Active Interventions: Assess: immobility, friction, shearing, incontinence upon admission and as needed Notes: Wound/Skin Impairment Nursing Diagnoses: Impaired tissue integrity Goals: Ulcer/skin breakdown will have a volume reduction of 80% by week 12 Date Initiated: 06/15/2018 Target Resolution Date: 09/15/2018 Goal Status: Active Interventions: Assess ulceration(s) every visit Treatment Activities: Skin care regimen initiated : 06/15/2018 Notes: Electronic Signature(s) Signed: 11/24/2018 5:43:44 PM By: Ryan GurneyWoody, BSN, [name], CWS, [name], BSN Entered By: Ryan GurneyWoody, BSN, [name], CWS, Ryan on 11/23/2018 15:03:58 Dimmitt, [name] ([id number]) -------------------------------------------------------------------------------- Pain Assessment Details Patient Name: [name], [name] Date of Service: 11/23/2018 2:30 PM Medical Record Number: [id number] Patient Account Number: [id number] Date of Birth/Sex: July 31, 1956 [age] y.o. M) Treating [name]: Ryan JasmineNg, Wendi Primary Care Lutisha Knoche: Sindy MessingGOMEZ, ROGER Other Clinician: Referring Marcelle Hepner: Sindy MessingGOMEZ, ROGER Treating Tameka Hoiland/Extender: Ryan CarolinaOBSON, MICHAEL G Weeks in Treatment: 25 Active Problems Location of Pain Severity and Description of Pain Patient Has Paino No Site Locations Pain Management and Medication Current Pain Management: Notes pt denies any pain at this time. Electronic Signature(s) Signed: 11/23/2018 4:29:26 PM By: Ryan JasmineNg, Wendi Entered By: Ryan JasmineNg, Wendi on 11/23/2018 14:57:11 [name], Ryan Lacretia NicksW.  ([id number]) -------------------------------------------------------------------------------- Patient/Caregiver Education Details Patient Name: [name], Ajahni W. Date of Service: 11/23/2018 2:30 PM Medical Record Number: [id number] Patient Account Number: [id number] Date of Birth/Gender: July 31, 1956 (63 y.o. M) Treating [name]: Ryan CoventryWoody, Ryan Primary Care Physician: Sindy MessingGOMEZ, ROGER Other Clinician: Referring Physician: Sindy MessingGOMEZ, ROGER Treating Physician/Extender: Ryan CarolinaOBSON, MICHAEL G Weeks in Treatment: 25 Education Assessment Education Provided To: Patient Education Topics Provided Wound/Skin Impairment: Handouts: Caring for Your Ulcer Methods: Demonstration, Explain/Verbal Responses: State content correctly Electronic Signature(s) Signed: 11/24/2018 5:43:44 PM By: Ryan GurneyWoody, BSN, [name], CWS, [name], BSN Entered By: Ryan GurneyWoody, BSN, [name], CWS, Ryan on 11/24/2018 12:46:10 [name], Campbell W. ([id number]) -------------------------------------------------------------------------------- Wound Assessment Details Patient Name: [name], Alieu W. Date of Service: 11/23/2018 2:30 PM Medical Record Number: [id number] Patient Account Number: [id number] Date of Birth/Sex: July 31, 1956 [age] y.o. M) Treating [name]: Ryan JasmineNg, Wendi Primary Care Dene Landsberg: Sindy MessingGOMEZ, ROGER Other Clinician: Referring Kadijah Shamoon: Sindy MessingGOMEZ, ROGER Treating Belisa Eichholz/Extender: Leanord HawkingOBSON,  MICHAEL G Weeks in Treatment: 25 Wound Status Wound Number: 1 Primary Pressure Ulcer Etiology: Wound Location: Right Gluteus Wound Open Wounding Event: Gradually Appeared Status: Date Acquired: 05/09/2018 Comorbid Cataracts, Congestive Heart Failure, Coronary Weeks Of Treatment: 25 History: Artery Disease, Hypertension, Myocardial Clustered Wound: No Infarction, History of pressure wounds, Rheumatoid Arthritis Photos Photo Uploaded By: Ryan JasmineNg, Wendi on 11/23/2018 15:17:56 Wound Measurements Length: (cm) 2 Width: (cm) 1.5 Depth: (cm) 0.2 Area: (cm) 2.356 Volume: (cm) 0.471 %  Reduction in Area: 98.1% % Reduction in Volume: 99.9% Epithelialization: None Tunneling: No Undermining: No Wound Description Classification: Category/Stage IV Foul Odor Wound Margin: Distinct, outline attached Slough/Fi Exudate Amount: Small Exudate Type: Serous Exudate Color: amber After Cleansing: No brino Yes Wound Bed Granulation Amount: None Present (0%) Exposed Structure Necrotic Amount: Medium (34-66%) Fascia Exposed: Yes Necrotic Quality: Adherent Slough Fat Layer (Subcutaneous Tissue) Exposed: Yes Muscle Exposed: Yes Necrosis of Muscle: No Bone Exposed: Yes Periwound Skin Texture Streat, Arihant W. ([id number]) Texture Color No Abnormalities Noted: No No Abnormalities Noted: No Callus: No Atrophie Blanche: No Crepitus: No Cyanosis: No Excoriation: No Ecchymosis: No Induration: No Erythema: No Rash: No Hemosiderin Staining: No Scarring: Yes Mottled: No Pallor: No Moisture Rubor: No No Abnormalities Noted: No Dry / Scaly: No Temperature / Pain Maceration: No Temperature: No Abnormality Tenderness on Palpation: Yes Wound Preparation Ulcer Cleansing: Rinsed/Irrigated with Saline Topical Anesthetic Applied: Other: lidocaine 4%, Electronic Signature(s) Signed: 11/23/2018 4:29:26 PM By: Ryan JasmineNg, Wendi Entered By: Ryan JasmineNg, Wendi on 11/23/2018 14:56:27 Alomar, Shade W. ([id number]) -------------------------------------------------------------------------------- Vitals Details Patient Name: Christell ConstantMOORE, [name] Date of Service: 11/23/2018 2:30 PM Medical Record Number: [id number] Patient Account Number: [id number] Date of Birth/Sex: 09/23/1956 [age] y.o. M) Treating [name]: Ryan JasmineNg, Wendi Primary Care Emon Miggins: Sindy MessingGOMEZ, ROGER Other Clinician: Referring Marcy Sookdeo: Sindy MessingGOMEZ, ROGER Treating Naylin Burkle/Extender: Ryan CarolinaOBSON, MICHAEL G Weeks in Treatment: 25 Vital Signs Time Taken: 14:35 Temperature (F): 97.8 Height (in): 73 Pulse (bpm): 99 Weight (lbs): 206 Respiratory Rate (breaths/min):  16 Body Mass Index (BMI): 27.2 Blood Pressure (mmHg): 117/75 Reference Range: 80 - 120 mg / dl Electronic Signature(s) Signed: 11/23/2018 4:29:26 PM By: Ryan JasmineNg, Wendi Entered ByRema [name]: Ng, Wendi on 11/23/2018 14:58:05

## 2018-12-14 ENCOUNTER — Ambulatory Visit: Payer: Medicaid Other | Admitting: Internal Medicine

## 2018-12-21 ENCOUNTER — Encounter: Payer: Medicaid Other | Attending: Internal Medicine | Admitting: Internal Medicine

## 2018-12-21 DIAGNOSIS — J849 Interstitial pulmonary disease, unspecified: Secondary | ICD-10-CM | POA: Insufficient documentation

## 2018-12-21 DIAGNOSIS — Z7952 Long term (current) use of systemic steroids: Secondary | ICD-10-CM | POA: Diagnosis not present

## 2018-12-21 DIAGNOSIS — I10 Essential (primary) hypertension: Secondary | ICD-10-CM | POA: Insufficient documentation

## 2018-12-21 DIAGNOSIS — E43 Unspecified severe protein-calorie malnutrition: Secondary | ICD-10-CM | POA: Insufficient documentation

## 2018-12-21 DIAGNOSIS — Z8673 Personal history of transient ischemic attack (TIA), and cerebral infarction without residual deficits: Secondary | ICD-10-CM | POA: Diagnosis not present

## 2018-12-21 DIAGNOSIS — L89314 Pressure ulcer of right buttock, stage 4: Secondary | ICD-10-CM | POA: Diagnosis present

## 2018-12-22 NOTE — Progress Notes (Signed)
RENZO, VINCELETTE ([id number]) Visit Report for 12/21/2018 HPI Details Patient Name: [name], [name] Date of Service: 12/21/2018 2:15 PM Medical Record Number: [id number] Patient Account Number: [id number] Date of Birth/Sex: 08-24-1956 (63 y.o. M) Treating RN: Huel Coventry Primary Care Provider: Sindy Messing Other Clinician: Referring Provider: Sindy Messing Treating Provider/Extender: Altamese [place] in Treatment: 29 History of Present Illness HPI Description: ADMISSION 06/01/18 This is a 63 year old man who is sent to [place] currently residing at [facility] skilled facility. He has a complicated recent medical history. His sister accompanies him tells [place] that he has been somewhat disabled since suffering a left basal ganglial CVA in 2018. At that point in time he also had myocardial infarctions. More recently he has been hospitalized from 04/04/18 through 04/12/18 with septic shock, coronary artery disease, congestive heart failure and an acute CVA. During this hospitalization he was noted to have sacral wounds but these were not felt to be the source of infection however the source of the infection was never really determined at that point. I believe he was sent to a skilled facility. He reports that at the hospital on 05/09/18 through 05/18/18. At that point he was noted to have a large abscess of the right buttock. He required a surgical IandD by Dr. Gerrit Friends of general surgery. Culture of this grew Citrobacter,Eikenella and Bacteroides. He had a CT scan of the pelvis that showed an abnormal soft tissue collection just caudal to the right ischial tuberosity measuring 5.9 and by 4.7. This also involved the distal right deep muscles. There was some cortical bone loss on the right ischial tuberosity and there was some concern with osteomyelitis. He was felt to require treatment for osteomyelitis based on clinical and radiographic findings. He did not have an MRI. Past medical history includes;  abdominal aortic aneurysm, carotid stenosis, hypertension, osteoarthritis, interstitial lung disease on chronic steroids, rheumatoid arthritis, congestive heart failure/ischemic cardiomyopathy with an ejection fraction of 35-40% Also noteworthy is on 05/09/18 his albumin was 2.2 on admission to hospital. I don't think this is actually been repeated. The patient states his appetite is good and he is taking supplements 100%. 06/15/18-He is seen in follow up vibration for right buttock ulcer; this is stable in appearance with minimal amount of non-viable tissue. 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 We've been using a wound VAC. This is centered over the right ischial tuberosity. He is making generally good progress. He was severely hypoalbuminemic at one time although he states that he eats well and takes his supplements. 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 We have been using silver collagen over the superior aspect of the right ischial tuberosity under wound VAC.he has made some nice improvements. The dimensions of the wound are certainly smaller. He has no exposed bone. We received lab work from [facility]. His prealbumin was 18.6 and albumin at 2.57. This is obviously still low. I am not sure what interventions are in place. I know he is on supplements. I discussed this with the patient. I told him that the albumin represents already moderate to severe protein malnutrition. This would contribute it to do difficulty healing the wound 07/20/18; I received copies of lab work from [facility] on 07/06/18 documenting a sedimentation rate of 25 which seems to be in line with what I see  from previous values however his C-reactive protein was 110.8 versus 84.7 on 06/22/18. I have looked through [facility] health Link I  don't see a prior value for either inflammatory measure. he has been treated with [name] which I think he is still on for osteomyelitis of the right ischial tuberosity as well as a soft tissue collection just caudad to the right ischial tuberosity itself. Felt to have a soft tissue abscess and/or stigmata of necrotizing fasciitis. I'm not sure who is following this. His CT scan was on 05/09/18 yet he still remains on Invanz. Looking back through the original discharge summary the [name] was supposed to finish on 07/05/18. 08/03/18 the patient continues to make good improvement. The area over the right ischial tuberosity has filled in nicely. His developed some what looks to be candidal skin infection in the periwound. I think they've taken the wound VAC off and are [name], [name]. ([id number]) applying nystatin 08/17/18; 2 week follow-up for this resident from [facility]. They resumed the wound VAC. The area continues to fill in nicely. The only problem area is the tissue over the ischial tuberosity itself which needs to come up a bit. For this reason I'm continuing the wound VAC. Otherwise this is doing remarkably well. The patient states that he is eating everything they give him although I don't see a follow-up albumin. 08/31/18; the patient states he had blood work done today. I do not have these results. He claims to be eating well. He continues with a wound VAC and the wound is making nice progress. 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 This is coming quite nicely he still has one Onalee Hua that has some depth but I think by the next time we see him in 2 weeks we should be able to move on from the wound VAC. He is still at skilled care at [facility]. As far as I am aware he is eating and drinking well. I have  not seen follow-up albumin studies on him recently but the message I'm getting is that he is eating well and taking his supplements. The patient states he is not very mobile and I am hopeful that we are able to insure pressure relief here however if we hadn't been ensuring pressure relief I doubt this would've closed in as well as it has even with the wound VAC. He does not complain of any systemic issues 09/28/2018 for follow-up and management of stage IV sacral ulcer that is being managed with a wound VAC. He is a resident at a long-term care facility, [facility]. The wound continues to have some depth and width some granulation of tissue on the outer borders of the wound. The wound was tender to touch today during exam. He denies any pain to the wound when it is not being touched. Recent fevers, chills, nausea, vomiting, or shortness of breath. Hopeful over the next couple of weeks he will be able to transition from the wound VAC. 10/19/2018; this patient has a stage IV over the right ischial tuberosity. The wound is come to the surface of the skin and looks very healthy at this point. His wound VAC can be discontinued. The patient apparently is eating well taking his supplements. He has no systemic complaints 11/23/2018. The patient has a stage IV wound over  his right ischial tuberosity that is now down to a quarter sized superficial area. There is no depth to this. The tissue looks healthy. No debridement was required. Continued improvement. We are using silver alginate and bordered foam 2/19; the original stage IV wound over his right ischial tuberosity. Continues to make nice progress in terms of dimensions. There is no depth to this. We have been using silver alginate and border foam Electronic Signature(s) Signed: 12/21/2018 6:18:32 PM By: Baltazar Najjar MD Entered By: Baltazar Najjar on 12/21/2018 17:36:48 Sealy, [name]  ([id number]) -------------------------------------------------------------------------------- Physical Exam Details Patient Name: TOBEN, ACUNA. Date of Service: 12/21/2018 2:15 PM Medical Record Number: [id number] Patient Account Number: [id number] Date of Birth/Sex: 1956/09/24 (63 y.o. M) Treating RN: Huel Coventry Primary Care Provider: Sindy Messing Other Clinician: Referring Provider: Sindy Messing Treating Provider/Extender: Maxwell Caul We[place] in Treatment: 29 Constitutional Sitting or standing Blood Pressure is within target range for patient.. Pulse regular and within target range for patient.[name] Respirations regular, non-labored and within target range.. Temperature is normal and within the target range for the patient.[name] appears in no distress. Respiratory Respiratory effort is easy and symmetric bilaterally. Rate is normal at rest and on room air.. Cardiovascular Heart rhythm and rate regular, without murmur or gallop.. Gastrointestinal (GI) Abdomen is soft and non-distended without masses or tenderness. Bowel sounds active in all quadrants.. Genitourinary (GU) Bladder not distended. Integumentary (Hair, Skin) No surrounding erythema or soft tissue crepitus. Psychiatric No evidence of depression, anxiety, or agitation. Calm, cooperative, and communicative. Appropriate interactions and affect.. Notes Wound exam; the area over the right ischial tuberosity appears to be contracting nicely slightly dry but no evidence of infection Electronic Signature(s) Signed: 12/21/2018 6:18:32 PM By: Baltazar Najjar MD Entered By: Baltazar Najjar on 12/21/2018 17:38:28 Loyal, [name] ([id number]) -------------------------------------------------------------------------------- Physician Orders Details Patient Name: MIRIAM, KESTLER. Date of Service: 12/21/2018 2:15 PM Medical Record Number: [id number] Patient Account Number: [id number] Date of Birth/Sex: [birth year] (63 y.o. M) Treating  RN: Huel Coventry Primary Care Provider: Sindy Messing Other Clinician: Referring Provider: Sindy Messing Treating Provider/Extender: Altamese [place] in Treatment: 36 Verbal / Phone Orders: No Diagnosis Coding Wound Cleansing Wound #1 Right Gluteus o Clean wound with Normal Saline. Anesthetic (add to Medication List) Wound #1 Right Gluteus o Topical Lidocaine 4% cream applied to wound bed prior to debridement (In Clinic Only). Primary Wound Dressing Wound #1 Right Gluteus o Silver Alginate Secondary Dressing Wound #1 Right Gluteus o Boardered Foam Dressing Dressing Change Frequency Wound #1 Right Gluteus o Change dressing every other day. Follow-up Appointments Wound #1 Right Gluteus o Return Appointment in 3 weeks. Off-Loading Wound #1 Right Gluteus o Turn and reposition every 2 hours Electronic Signature(s) Signed: 12/21/2018 6:18:32 PM By: Baltazar Najjar MD Signed: 12/21/2018 6:42:35 PM By: Elliot Gurney, BSN, RN, CWS, Kim RN, BSN Entered By: Elliot Gurney, BSN, RN, CWS, Kim on 12/21/2018 15:17:53 Knape, [name] ([id number]) -------------------------------------------------------------------------------- Problem List Details Patient Name: LIBORIO, SACCENTE. Date of Service: 12/21/2018 2:15 PM Medical Record Number: [id number] Patient Account Number: [id number] Date of Birth/Sex: [birth year] (63 y.o. M) Treating RN: Huel Coventry Primary Care Provider: Sindy Messing Other Clinician: Referring Provider: Sindy Messing Treating Provider/Extender: Altamese [place] in Treatment: 29 Active Problems ICD-10 Evaluated Encounter Code Description Active Date Today Diagnosis L89.314 Pressure ulcer of right buttock, stage 4 06/01/2018 No Yes T81.31XD Disruption of external operation (surgical) wound, not 06/01/2018 No Yes elsewhere classified, subsequent encounter E43 Unspecified severe protein-calorie malnutrition 06/01/2018 No Yes Inactive  Problems ICD-10 Code Description  Active Date Inactive Date M86.18 Other acute osteomyelitis, other site 06/01/2018 06/01/2018 Resolved Problems Electronic Signature(s) Signed: 12/21/2018 6:18:32 PM By: Baltazar Najjar MD Entered By: Baltazar Najjar on 12/21/2018 17:36:02 Teutsch, Eloy W. ([id number]) -------------------------------------------------------------------------------- Progress Note Details Patient Name: [name]. Date of Service: 12/21/2018 2:15 PM Medical Record Number: [id number] Patient Account Number: [id number] Date of Birth/Sex: Jul 24, 1956 (63 y.o. M) Treating RN: Huel Coventry Primary Care Provider: Sindy Messing Other Clinician: Referring Provider: Sindy Messing Treating Provider/Extender: Altamese [place] in Treatment: 29 Subjective History of Present Illness (HPI) ADMISSION 06/01/18 This is a 63 year old man who is sent to [place] currently residing at [facility] skilled facility. He has a complicated recent medical history. His sister accompanies him tells [place] that he has been somewhat disabled since suffering a left basal ganglial CVA in 2018. At that point in time he also had myocardial infarctions. More recently he has been hospitalized from 04/04/18 through 04/12/18 with septic shock, coronary artery disease, congestive heart failure and an acute CVA. During this hospitalization he was noted to have sacral wounds but these were not felt to be the source of infection however the source of the infection was never really determined at that point. I believe he was sent to a skilled facility. He reports that at the hospital on 05/09/18 through 05/18/18. At that point he was noted to have a large abscess of the right buttock. He required a surgical IandD by Dr. Gerrit Friends of general surgery. Culture of this grew Citrobacter,Eikenella and Bacteroides. He had a CT scan of the pelvis that showed an abnormal soft tissue collection just caudal to the right ischial tuberosity measuring 5.9 and by 4.7. This  also involved the distal right deep muscles. There was some cortical bone loss on the right ischial tuberosity and there was some concern with osteomyelitis. He was felt to require treatment for osteomyelitis based on clinical and radiographic findings. He did not have an MRI. Past medical history includes; abdominal aortic aneurysm, carotid stenosis, hypertension, osteoarthritis, interstitial lung disease on chronic steroids, rheumatoid arthritis, congestive heart failure/ischemic cardiomyopathy with an ejection fraction of 35-40% Also noteworthy is on 05/09/18 his albumin was 2.2 on admission to hospital. I don't think this is actually been repeated. The patient states his appetite is good and he is taking supplements 100%. 06/15/18-He is seen in follow up vibration for right buttock ulcer; this is stable in appearance with minimal amount of non-viable tissue. 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 We've been using a wound VAC. This is centered over the right ischial tuberosity. He is making generally good progress. He was severely hypoalbuminemic at one time although he states that he eats well and takes his supplements. 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 We have been using silver collagen over the superior aspect of the right ischial tuberosity under wound VAC.he has made some nice improvements. The dimensions of the wound are certainly smaller. He has no exposed bone. We received lab work from [facility]. His prealbumin was 18.6 and albumin at 2.57. This is obviously still low. I am not sure what interventions are in place. I know he is on supplements. I discussed this with the patient. I told him that the albumin represents already moderate to severe protein malnutrition. This would contribute it  to  do difficulty healing the wound 07/20/18; I received copies of lab work from [facility] on 07/06/18 documenting a sedimentation rate of 25 which seems to be in line with what I see from previous values however his C-reactive protein was 110.8 versus 84.7 on 06/22/18. I have looked through [facility] health Link I don't see a prior value for either inflammatory measure. he has been treated with Pincus SanesInvanz which I think he is still on for osteomyelitis of the right ischial tuberosity as well as a soft tissue collection just caudad to the right ischial tuberosity itself. Felt to have a soft tissue abscess and/or stigmata of necrotizing fasciitis. I'm not sure who is following this. His CT scan was on 05/09/18 yet he still remains on Invanz. Looking back through the original discharge summary the Pincus Sanesnvanz was supposed to finish on 07/05/18. 08/03/18 the patient continues to make good improvement. The area over the right ischial tuberosity has filled in nicely. His developed some what looks to be candidal skin infection in the periwound. I think they've taken the wound VAC off and are applying nystatin 08/17/18; 2 week follow-up for this resident from [facility]. They resumed the wound VAC. The area continues to fill in nicely. The only problem area is the tissue over the ischial tuberosity itself which needs to come up a bit. For this reason Lazaro Arms'm Danko, Kenta W. ([id number]) continuing the wound VAC. Otherwise this is doing remarkably well. The patient states that he is eating everything they give him although I don't see a follow-up albumin. 08/31/18; the patient states he had blood work done today. I do not have these results. He claims to be eating well. He continues with a wound VAC and the wound is making nice progress. This was a 1 time a deep stage IV wound over the right ischial tuberosity with accompanied osteomyelitis 09/14/18; patient is still using a wound VAC to the large stage IV pressure ulcer On the  right ischial tuberosity area.. This is coming quite nicely he still has one Onalee HuaDavid that has some depth but I think by the next time we see him in 2 weeks we should be able to move on from the wound VAC. [facility]. As far as I am aware he is eating and drinking well. I have not seen follow-up albumin studies on him recently but the message I'm getting is that he is eating well and taking his supplements. The patient states he is not very mobile and I am hopeful that we are able to insure pressure relief here however if we hadn't been ensuring pressure relief I doubt this would've closed in as well as it has even with the wound VAC. He does not complain of any systemic issues 09/28/2018 for follow-up and management of stage IV sacral ulcer that is being managed with a wound VAC. He is a resident at a [facility]. The wound continues to have some depth and width some granulation of tissue on the outer borders of the wound. The wound was tender to touch today during exam. He denies any pain to the wound when it is not being touched. Recent fevers, chills, nausea, vomiting, or shortness of breath. Hopeful over the next couple of weeks he will be able to transition from the wound VAC. 10/19/2018; this patient has a stage IV over the right ischial tuberosity. The wound is come to the surface of the skin and looks  very healthy at this point. His wound VAC can be discontinued. The patient apparently is eating well taking his supplements. He has no systemic complaints 11/23/2018. The patient has a stage IV wound over his right ischial tuberosity that is now down to a quarter sized superficial area. There is no depth to this. The tissue looks healthy. No debridement was required. Continued improvement. We are using silver alginate and bordered foam 2/19; the original stage IV wound over his right ischial tuberosity. Continues to make nice progress in  terms of dimensions. There is no depth to this. We have been using silver alginate and border foam Objective Constitutional Sitting or standing Blood Pressure is within target range for patient.. Pulse regular and within target range for patient.[name]. Respirations regular, non-labored and within target range.. Temperature is normal and within the target range for the patient.[name]. appears in no distress. Vitals Time Taken: 3:50 PM, Height: 73 in, Weight: 206 lbs, BMI: 27.2, Temperature: 97.8 F, Pulse: 88 bpm, Respiratory Rate: 16 breaths/min, Blood Pressure: 115/80 mmHg. Respiratory Respiratory effort is easy and symmetric bilaterally. Rate is normal at rest and on room air.. Cardiovascular Heart rhythm and rate regular, without murmur or gallop.. Gastrointestinal (GI) Abdomen is soft and non-distended without masses or tenderness. Bowel sounds active in all quadrants.. Genitourinary (GU) Bladder not distended. Psychiatric [name], Ganon W. ([id number]) No evidence of depression, anxiety, or agitation. Calm, cooperative, and communicative. Appropriate interactions and affect.. General Notes: Wound exam; the area over the right ischial tuberosity appears to be contracting nicely slightly dry but no evidence of infection Integumentary (Hair, Skin) No surrounding erythema or soft tissue crepitus. Wound #1 status is Open. Original cause of wound was Gradually Appeared. The wound is located on the Right Gluteus. The wound measures 1.3cm length x 1.2cm width x 0.2cm depth; 1.225cm^2 area and 0.245cm^3 volume. There is bone, muscle, Fat Layer (Subcutaneous Tissue) Exposed, and fascia exposed. There is no tunneling or undermining noted. There is a small amount of serous drainage noted. The wound margin is distinct with the outline attached to the wound base. There is no granulation within the wound bed. There is a medium (34-66%) amount of necrotic tissue within the wound bed including Adherent Slough.  The periwound skin appearance exhibited: Scarring. The periwound skin appearance did not exhibit: Callus, Crepitus, Excoriation, Induration, Rash, Dry/Scaly, Maceration, Atrophie Blanche, Cyanosis, Ecchymosis, Hemosiderin Staining, Mottled, Pallor, Rubor, Erythema. Periwound temperature was noted as No Abnormality. The periwound has tenderness on palpation. Assessment Active Problems ICD-10 Pressure ulcer of right buttock, stage 4 Disruption of external operation (surgical) wound, not elsewhere classified, subsequent encounter Unspecified severe protein-calorie malnutrition Plan Wound Cleansing: Wound #1 Right Gluteus: Clean wound with Normal Saline. Anesthetic (add to Medication List): Wound #1 Right Gluteus: Topical Lidocaine 4% cream applied to wound bed prior to debridement (In Clinic Only). Primary Wound Dressing: Wound #1 Right Gluteus: Silver Alginate Secondary Dressing: Wound #1 Right Gluteus: Boardered Foam Dressing Dressing Change Frequency: Wound #1 Right Gluteus: Change dressing every other day. Follow-up Appointments: Wound #1 Right Gluteus: Return Appointment in 3 weeks. Off-LoadingVerne Carrow: Harper, Eulice W. ([id number]) Wound #1 Right Gluteus: Turn and reposition every 2 hours 1. Silver alginate covered with a border foam dressing 2. I had some thoughts about changing to collagen based on the dryness of the wound bed but the overall area of the wound is come down so nicely it was difficult to justify changing this at all. 3. Once again I have emphasized that he has skin essentially  over bone there is no soft tissue underneath this. This will need rigorous offloading for the rest of his life Electronic Signature(s) Signed: 12/21/2018 6:18:32 PM By: Baltazar Najjar MD Entered By: Baltazar Najjar on 12/21/2018 17:42:52 Poche, [name] ([id number]) -------------------------------------------------------------------------------- SuperBill Details Patient Name: [name]. Date of Service: 12/21/2018 Medical Record Number: [id number] Patient Account Number: [id number] Date of Birth/Sex: 1956-05-13 (63 y.o. M) Treating RN: Huel Coventry Primary Care Provider: Sindy Messing Other Clinician: Referring Provider: Sindy Messing Treating Provider/Extender: Altamese [place] in Treatment: 29 Diagnosis Coding ICD-10 Codes Code Description L89.314 Pressure ulcer of right buttock, stage 4 T81.31XD Disruption of external operation (surgical) wound, not elsewhere classified, subsequent encounter E43 Unspecified severe protein-calorie malnutrition Facility Procedures CPT4 Code: [id number] Description: [phone] - WOUND CARE VISIT-LEV 2 EST PT Modifier: Quantity: 1 Physician Procedures CPT4: Description Modifier Quantity Code [id number] - WC PHYS LEVEL 3 - EST PT 1 ICD-10 Diagnosis Description L89.314 Pressure ulcer of right buttock, stage 4 T81.31XD Disruption of external operation (surgical) wound, not elsewhere classified,  subsequent encounter Electronic Signature(s) Signed: 12/21/2018 6:18:32 PM By: Baltazar Najjar MD Entered By: Baltazar Najjar on 12/21/2018 17:43:14

## 2018-12-27 NOTE — Progress Notes (Signed)
[name], [name] ([id number]) Visit Report for 12/21/2018 Arrival Information Details Patient Name: [name], [name] Date of Service: 12/21/2018 2:15 PM Medical Record Number: [id number] Patient Account Number: [id number] Date of Birth/Sex: [birth year] [age] y.o. M) Treating RN: Rodell PernaScott, Dajea Primary Care Lesley Atkin: Sindy MessingGOMEZ, Ryan Other Clinician: Referring Susen Haskew: Sindy MessingGOMEZ, Ryan Treating Thoams Siefert/Extender: Altamese CarolinaOBSON, MICHAEL G Weeks in Treatment: 29 Visit Information History Since Last Visit Added or deleted any medications: No Patient Arrived: Wheel Chair Any new allergies or adverse reactions: No Arrival Time: 14:50 Had a fall or experienced change in No activities of daily living that may affect Accompanied By: sister risk of falls: Transfer Assistance: Nurse, adultHoyer Lift Signs or symptoms of abuse/neglect since last visito No Patient Identification Verified: Yes Hospitalized since last visit: No Patient Requires Transmission-Based No Implantable device outside of the clinic excluding No Precautions: cellular tissue based products placed in the center Patient Has Alerts: No since last visit: Pain Present Now: Yes Electronic Signature(s) Signed: 12/23/2018 4:09:31 PM By: Rodell PernaScott, Dajea Entered By: Rodell PernaScott, Dajea on 12/21/2018 14:51:31 [name], [name] ([id number]) -------------------------------------------------------------------------------- Clinic Level of Care Assessment Details Patient Name: [name], [name] Date of Service: 12/21/2018 2:15 PM Medical Record Number: [id number] Patient Account Number: [id number] Date of Birth/Sex: [birth year] [age] y.o. M) Treating RN: Huel CoventryWoody, Kim Primary Care Evalena Fujii: Sindy MessingGOMEZ, Ryan Other Clinician: Referring Vimal Derego: Sindy MessingGOMEZ, Ryan Treating Yasmeen Manka/Extender: Altamese CarolinaOBSON, MICHAEL G Weeks in Treatment: 29 Clinic Level of Care Assessment Items TOOL 4 Quantity Score []  - Use when only an EandM is performed on FOLLOW-UP visit 0 ASSESSMENTS - Nursing Assessment /  Reassessment []  - Reassessment of Co-morbidities (includes updates in patient status) 0 X- 1 5 Reassessment of Adherence to Treatment Plan ASSESSMENTS - Wound and Skin Assessment / Reassessment X - Simple Wound Assessment / Reassessment - one wound 1 5 []  - 0 Complex Wound Assessment / Reassessment - multiple wounds []  - 0 Dermatologic / Skin Assessment (not related to wound area) ASSESSMENTS - Focused Assessment []  - Circumferential Edema Measurements - multi extremities 0 []  - 0 Nutritional Assessment / Counseling / Intervention []  - 0 Lower Extremity Assessment (monofilament, tuning fork, pulses) []  - 0 Peripheral Arterial Disease Assessment (using hand held doppler) ASSESSMENTS - Ostomy and/or Continence Assessment and Care []  - Incontinence Assessment and Management 0 []  - 0 Ostomy Care Assessment and Management (repouching, etc.) PROCESS - Coordination of Care X - Simple Patient / Family Education for ongoing care 1 15 []  - 0 Complex (extensive) Patient / Family Education for ongoing care []  - 0 Staff obtains ChiropractorConsents, Records, Test Results / Process Orders []  - 0 Staff telephones HHA, Nursing Homes / Clarify orders / etc []  - 0 Routine Transfer to another Facility (non-emergent condition) []  - 0 Routine Hospital Admission (non-emergent condition) []  - 0 New Admissions / Manufacturing engineernsurance Authorizations / Ordering NPWT, Apligraf, etc. []  - 0 Emergency Hospital Admission (emergent condition) X- 1 10 Simple Discharge Coordination [name], [name] ([id number]) []  - 0 Complex (extensive) Discharge Coordination PROCESS - Special Needs []  - Pediatric / Minor Patient Management 0 []  - 0 Isolation Patient Management []  - 0 Hearing / Language / Visual special needs []  - 0 Assessment of Community assistance (transportation, D/C planning, etc.) []  - 0 Additional assistance / Altered mentation []  - 0 Support Surface(s) Assessment (bed, cushion, seat, etc.) INTERVENTIONS -  Wound Cleansing / Measurement X - Simple Wound Cleansing - one wound 1 5 []  - 0 Complex Wound Cleansing - multiple wounds X- 1 5 Wound Imaging (photographs -  any number of wounds)  - 0 Wound Tracing (instead of photographs) X- 1 5 Simple Wound Measurement - one wound  - 0 Complex Wound Measurement - multiple wounds INTERVENTIONS - Wound Dressings  - Small Wound Dressing one or multiple wounds 0 X- 1 15 Medium Wound Dressing one or multiple wounds  - 0 Large Wound Dressing one or multiple wounds  - 0 Application of Medications - topical  - 0 Application of Medications - injection INTERVENTIONS - Miscellaneous  - External ear exam 0  - 0 Specimen Collection (cultures, biopsies, blood, body fluids, etc.)  - 0 Specimen(s) / Culture(s) sent or taken to Lab for analysis  - 0 Patient Transfer (multiple staff / Nurse, adult / Similar devices)  - 0 Simple Staple / Suture removal (25 or less)  - 0 Complex Staple / Suture removal (26 or more)  - 0 Hypo / Hyperglycemic Management (close monitor of Blood Glucose)  - 0 Ankle / Brachial Index (ABI) - do not check if billed separately X- 1 5 Vital Signs [name], [name] ([id number]) Has the patient been seen at the hospital within the last three years: Yes Total Score: 70 Level Of Care: New/Established - Level 2 Electronic Signature(s) Signed: 12/21/2018 6:42:35 PM By: Elliot Gurney, BSN, RN, CWS, Kim RN, BSN Entered By: Elliot Gurney, BSN, RN, CWS, Kim on 12/21/2018 15:18:26 Holaday, [name] ([id number]) -------------------------------------------------------------------------------- Encounter Discharge Information Details Patient Name: [name], LIST. Date of Service: 12/21/2018 2:15 PM Medical Record Number: [id number] Patient Account Number: [id number] Date of Birth/Sex: [birth year] (63 y.o. M) Treating RN: Arnette Norris Primary Care Chemere Steffler: Sindy Messing Other Clinician: Referring Mithcell Schumpert: Sindy Messing Treating Adelita Hone/Extender: Altamese [place] in Treatment: 29 Encounter Discharge Information Items Discharge Condition: Stable Ambulatory Status: Ambulatory Discharge Destination: Home Transportation: Private Auto Accompanied By: self Schedule Follow-up Appointment: Yes Clinical Summary of Care: Electronic Signature(s) Signed: 12/22/2018 9:06:27 AM By: Arnette Norris Entered By: Arnette Norris on 12/22/2018 09:06:27 [name], [name] ([id number]) -------------------------------------------------------------------------------- Lower Extremity Assessment Details Patient Name: Ryan Carrow. Date of Service: 12/21/2018 2:15 PM Medical Record Number: [id number] Patient Account Number: [id number] Date of Birth/Sex: [birth year] (63 y.o. M) Treating RN: Rodell Perna Primary Care Krisa Blattner: Sindy Messing Other Clinician: Referring Leondra Cullin: Sindy Messing Treating Virlee Stroschein/Extender: Maxwell Caul Weeks in Treatment: 29 Electronic Signature(s) Signed: 12/23/2018 4:09:31 PM By: Rodell Perna Entered By: Rodell Perna on 12/21/2018 14:52:42 [name], [name] ([id number]) -------------------------------------------------------------------------------- Multi Wound Chart Details Patient Name: Ryan Carrow. Date of Service: 12/21/2018 2:15 PM Medical Record Number: [id number] Patient Account Number: [id number] Date of Birth/Sex: [birth year] (63 y.o. M) Treating RN: Huel Coventry Primary Care Nahiara Kretzschmar: Sindy Messing Other Clinician: Referring Josie Burleigh: Sindy Messing Treating Wane Mollett/Extender: Altamese [place] in Treatment: 29 Vital Signs Height(in): 73 Pulse(bpm): 88 Weight(lbs): 206 Blood Pressure(mmHg): 115/80 Body Mass Index(BMI): 27 Temperature(F): 97.8 Respiratory Rate 16 (breaths/min): Photos: [1:No Photos] [N/A:N/A] Wound Location: [1:Right Gluteus] [N/A:N/A] Wounding Event: [1:Gradually Appeared] [N/A:N/A] Primary Etiology: [1:Pressure Ulcer]  [N/A:N/A] Comorbid History: [1:Cataracts, Congestive Heart Failure, Coronary Artery Disease, Hypertension, Myocardial Infarction, History of pressure wounds, Rheumatoid Arthritis] [N/A:N/A] Date Acquired: [1:05/09/2018] [N/A:N/A] Weeks of Treatment: [1:29] [N/A:N/A] Wound Status: [1:Open] [N/A:N/A] Measurements L x W x D [1:1.3x1.2x0.2] [N/A:N/A] (cm) Area (cm) : [1:1.225] [N/A:N/A] Volume (cm) : [1:0.245] [N/A:N/A] % Reduction in Area: [1:99.00%] [N/A:N/A] % Reduction in Volume: [1:100.00%] [N/A:N/A] Classification: [1:Category/Stage IV] [N/A:N/A] Exudate Amount: [1:Small] [N/A:N/A] Exudate Type: [1:Serous] [N/A:N/A] Exudate Color: [1:amber] [N/A:N/A] Wound Margin: [1:Distinct, outline attached] [N/A:N/A] Granulation Amount: [1:None Present (0%)] [  N/A:N/A] Necrotic Amount: [1:Medium (34-66%)] [N/A:N/A] Exposed Structures: [1:Fascia: Yes Fat Layer (Subcutaneous Tissue) Exposed: Yes Muscle: Yes Bone: Yes] [N/A:N/A] Epithelialization: [1:None] [N/A:N/A] Periwound Skin Texture: [1:Scarring: Yes Excoriation: No Induration: No Callus: No] [N/A:N/A] Crepitus: No Rash: No Periwound Skin Moisture: Maceration: No N/A N/A Dry/Scaly: No Periwound Skin Color: Atrophie Blanche: No N/A N/A Cyanosis: No Ecchymosis: No Erythema: No Hemosiderin Staining: No Mottled: No Pallor: No Rubor: No Temperature: No Abnormality N/A N/A Tenderness on Palpation: Yes N/A N/A Wound Preparation: Ulcer Cleansing: N/A N/A Rinsed/Irrigated with Saline Topical Anesthetic Applied: Other: lidocaine 4% Treatment Notes Electronic Signature(s) Signed: 12/21/2018 6:18:32 PM By: Baltazar Najjar MD Entered By: Baltazar Najjar on 12/21/2018 17:36:10 [name], [name] ([id number]) -------------------------------------------------------------------------------- Multi-Disciplinary Care Plan Details Patient Name: [name], ASCHOFF. Date of Service: 12/21/2018 2:15 PM Medical Record Number: [id number] Patient  Account Number: [id number] Date of Birth/Sex: 04/23/1956 (62 y.o. M) Treating RN: Huel Coventry Primary Care Kerianna Rawlinson: Sindy Messing Other Clinician: Referring Dior Stepter: Sindy Messing Treating Amaranta Mehl/Extender: Altamese [place] in Treatment: 29 Active Inactive Necrotic Tissue Nursing Diagnoses: Knowledge deficit related to management of necrotic/devitalized tissue Goals: Necrotic/devitalized tissue will be minimized in the wound bed Date Initiated: 06/15/2018 Target Resolution Date: 06/29/2018 Goal Status: Active Interventions: Assess patient pain level pre-, during and post procedure and prior to discharge Treatment Activities: Apply topical anesthetic as ordered : 06/15/2018 Excisional debridement : 06/15/2018 Notes: Orientation to the Wound Care Program Nursing Diagnoses: Knowledge deficit related to the wound healing center program Goals: Patient/caregiver will verbalize understanding of the Wound Healing Center Program Date Initiated: 06/15/2018 Target Resolution Date: 07/01/2018 Goal Status: Active Interventions: Provide education on orientation to the wound center Notes: Pressure Nursing Diagnoses: Knowledge deficit related to management of pressures ulcers Goals: Patient will remain free from development of additional pressure ulcers Date Initiated: 06/15/2018 Target Resolution Date: 07/01/2018 [name], [name] ([id number]) Goal Status: Active Interventions: Assess: immobility, friction, shearing, incontinence upon admission and as needed Notes: Wound/Skin Impairment Nursing Diagnoses: Impaired tissue integrity Goals: Ulcer/skin breakdown will have a volume reduction of 80% by week 12 Date Initiated: 06/15/2018 Target Resolution Date: 09/15/2018 Goal Status: Active Interventions: Assess ulceration(s) every visit Treatment Activities: Skin care regimen initiated : 06/15/2018 Notes: Electronic Signature(s) Signed: 12/21/2018 6:42:35 PM By: Elliot Gurney, BSN, RN, CWS,  Kim RN, BSN Entered By: Elliot Gurney, BSN, RN, CWS, Kim on 12/21/2018 15:16:22 Zolman, [name] ([id number]) -------------------------------------------------------------------------------- Pain Assessment Details Patient Name: [name], [name] Date of Service: 12/21/2018 2:15 PM Medical Record Number: [id number] Patient Account Number: [id number] Date of Birth/Sex: Jul 25, 1956 (63 y.o. M) Treating RN: Rodell Perna Primary Care Precilla Purnell: Sindy Messing Other Clinician: Referring Toshika Parrow: Sindy Messing Treating Senta Kantor/Extender: Altamese [place] in Treatment: 29 Active Problems Location of Pain Severity and Description of Pain Patient Has Paino Yes Site Locations Pain Location: Pain in Ulcers With Dressing Change: No Duration of the Pain. Constant / Intermittento Constant Rate the pain. Current Pain Level: 9 Pain Management and Medication Current Pain Management: Electronic Signature(s) Signed: 12/23/2018 4:09:31 PM By: Rodell Perna Entered By: Rodell Perna on 12/21/2018 14:51:45 [name], [name] ([id number]) -------------------------------------------------------------------------------- Patient/Caregiver Education Details Patient Name: Ryan Carrow. Date of Service: 12/21/2018 2:15 PM Medical Record Number: [id number] Patient Account Number: [id number] Date of Birth/Gender: November 02, 1956 (62 y.o. M) Treating RN: Huel Coventry Primary Care Physician: Sindy Messing Other Clinician: Referring Physician: Sindy Messing Treating Physician/Extender: Altamese [place] in Treatment: 5 Education Assessment Education Provided To: Patient Education Topics Provided Wound/Skin Impairment: Handouts: Caring for Your Ulcer Methods:  Demonstration, Explain/Verbal Responses: State content correctly Electronic Signature(s) Signed: 12/27/2018 11:13:25 AM By: Arnette Norris Previous Signature: 12/21/2018 6:42:35 PM Version By: Elliot Gurney, BSN, RN, CWS, Kim RN, BSN Entered By: Arnette Norris on  12/22/2018 09:06:39 [name], [name] ([id number]) -------------------------------------------------------------------------------- Wound Assessment Details Patient Name: Ryan Carrow. Date of Service: 12/21/2018 2:15 PM Medical Record Number: [id number] Patient Account Number: [id number] Date of Birth/Sex: 1956-01-27 (63 y.o. M) Treating RN: Rodell Perna Primary Care Matilde Markie: Sindy Messing Other Clinician: Referring Star Cheese: Sindy Messing Treating Orville Mena/Extender: Maxwell Caul Weeks in Treatment: 29 Wound Status Wound Number: 1 Primary Pressure Ulcer Etiology: Wound Location: Right Gluteus Wound Open Wounding Event: Gradually Appeared Status: Date Acquired: 05/09/2018 Comorbid Cataracts, Congestive Heart Failure, Coronary Weeks Of Treatment: 29 History: Artery Disease, Hypertension, Myocardial Clustered Wound: No Infarction, History of pressure wounds, Rheumatoid Arthritis Photos Photo Uploaded By: Rodell Perna on 12/23/2018 16:01:50 Wound Measurements Length: (cm) 1.3 Width: (cm) 1.2 Depth: (cm) 0.2 Area: (cm) 1.225 Volume: (cm) 0.245 % Reduction in Area: 99% % Reduction in Volume: 100% Epithelialization: None Tunneling: No Undermining: No Wound Description Classification: Category/Stage IV Wound Margin: Distinct, outline attached Exudate Amount: Small Exudate Type: Serous Exudate Color: amber Foul Odor After Cleansing: No Slough/Fibrino Yes Wound Bed Granulation Amount: None Present (0%) Exposed Structure Necrotic Amount: Medium (34-66%) Fascia Exposed: Yes Necrotic Quality: Adherent Slough Fat Layer (Subcutaneous Tissue) Exposed: Yes Muscle Exposed: Yes Necrosis of Muscle: No Bone Exposed: Yes Periwound Skin Texture Maston, Albeiro W. ([id number]) Texture Color No Abnormalities Noted: No No Abnormalities Noted: No Callus: No Atrophie Blanche: No Crepitus: No Cyanosis: No Excoriation: No Ecchymosis: No Induration: No Erythema: No Rash:  No Hemosiderin Staining: No Scarring: Yes Mottled: No Pallor: No Moisture Rubor: No No Abnormalities Noted: No Dry / Scaly: No Temperature / Pain Maceration: No Temperature: No Abnormality Tenderness on Palpation: Yes Wound Preparation Ulcer Cleansing: Rinsed/Irrigated with Saline Topical Anesthetic Applied: Other: lidocaine 4%, Treatment Notes Wound #1 (Right Gluteus) Notes prisma, saline moistened gauze, ABD secured with tape until back in facility. Facility to apply NPWT. Electronic Signature(s) Signed: 12/23/2018 4:09:31 PM By: Rodell Perna Entered By: Rodell Perna on 12/21/2018 14:52:33 Rohrbach, Sylvestre W. ([id number]) -------------------------------------------------------------------------------- Vitals Details Patient Name: GRONAU, [name] Date of Service: 12/21/2018 2:15 PM Medical Record Number: [id number] Patient Account Number: [id number] Date of Birth/Sex: 1955-11-06 (63 y.o. M) Treating RN: Rodell Perna Primary Care Goldia Ligman: Sindy Messing Other Clinician: Referring Timarion Agcaoili: Sindy Messing Treating Dakisha Schoof/Extender: Altamese [place] in Treatment: 29 Vital Signs Time Taken: 15:50 Temperature (F): 97.8 Height (in): 73 Pulse (bpm): 88 Weight (lbs): 206 Respiratory Rate (breaths/min): 16 Body Mass Index (BMI): 27.2 Blood Pressure (mmHg): 115/80 Reference Range: 80 - 120 mg / dl Electronic Signature(s) Signed: 12/23/2018 4:09:31 PM By: Rodell Perna Entered By: Rodell Perna on 12/21/2018 14:52:05

## 2019-01-11 ENCOUNTER — Other Ambulatory Visit: Payer: Self-pay

## 2019-01-11 ENCOUNTER — Encounter: Payer: Medicaid Other | Attending: Internal Medicine | Admitting: Internal Medicine

## 2019-01-11 DIAGNOSIS — I2510 Atherosclerotic heart disease of native coronary artery without angina pectoris: Secondary | ICD-10-CM | POA: Diagnosis not present

## 2019-01-11 DIAGNOSIS — Z7952 Long term (current) use of systemic steroids: Secondary | ICD-10-CM | POA: Insufficient documentation

## 2019-01-11 DIAGNOSIS — M8580 Other specified disorders of bone density and structure, unspecified site: Secondary | ICD-10-CM | POA: Diagnosis not present

## 2019-01-11 DIAGNOSIS — Y838 Other surgical procedures as the cause of abnormal reaction of the patient, or of later complication, without mention of misadventure at the time of the procedure: Secondary | ICD-10-CM | POA: Diagnosis not present

## 2019-01-11 DIAGNOSIS — I255 Ischemic cardiomyopathy: Secondary | ICD-10-CM | POA: Insufficient documentation

## 2019-01-11 DIAGNOSIS — I509 Heart failure, unspecified: Secondary | ICD-10-CM | POA: Diagnosis not present

## 2019-01-11 DIAGNOSIS — L89314 Pressure ulcer of right buttock, stage 4: Secondary | ICD-10-CM | POA: Insufficient documentation

## 2019-01-11 DIAGNOSIS — Z8673 Personal history of transient ischemic attack (TIA), and cerebral infarction without residual deficits: Secondary | ICD-10-CM | POA: Insufficient documentation

## 2019-01-11 DIAGNOSIS — I252 Old myocardial infarction: Secondary | ICD-10-CM | POA: Insufficient documentation

## 2019-01-11 DIAGNOSIS — E43 Unspecified severe protein-calorie malnutrition: Secondary | ICD-10-CM | POA: Insufficient documentation

## 2019-01-11 DIAGNOSIS — J849 Interstitial pulmonary disease, unspecified: Secondary | ICD-10-CM | POA: Diagnosis not present

## 2019-01-11 DIAGNOSIS — T8131XA Disruption of external operation (surgical) wound, not elsewhere classified, initial encounter: Secondary | ICD-10-CM | POA: Diagnosis not present

## 2019-01-11 DIAGNOSIS — M069 Rheumatoid arthritis, unspecified: Secondary | ICD-10-CM | POA: Insufficient documentation

## 2019-01-11 DIAGNOSIS — I714 Abdominal aortic aneurysm, without rupture: Secondary | ICD-10-CM | POA: Diagnosis not present

## 2019-01-11 DIAGNOSIS — I110 Hypertensive heart disease with heart failure: Secondary | ICD-10-CM | POA: Diagnosis not present

## 2019-01-12 NOTE — Progress Notes (Signed)
[name], Mitchael W. ([id number]) Visit Report for 01/11/2019 HPI Details Patient Name: [name], [name] Date of Service: 01/11/2019 2:30 PM Medical Record Number: [id number] Patient Account Number: [id number] Date of Birth/Sex: 1956-03-25 10(63 y.o. M) Treating RN: Huel CoventryWoody, Kim Primary Care Provider: Sindy MessingGOMEZ, ROGER Other Clinician: Referring Provider: Sindy MessingGOMEZ, ROGER Treating Provider/Extender: Altamese CarolinaOBSON, Yaseen Gilberg G Weeks in Treatment: 32 History of Present Illness HPI Description: ADMISSION 06/01/18 This is a 63 year old man who is sent to us currently residing at [facility] skilled facility. He has a complicated recent medical history. His sister accompanies him tells us that he has been somewhat disabled since suffering a left basal ganglial CVA in 2018. At that point in time he also had myocardial infarctions. More recently he has been hospitalized from 04/04/18 through 04/12/18 with septic shock, coronary artery disease, congestive heart failure and an acute CVA. During this hospitalization he was noted to have sacral wounds but these were not felt to be the source of infection however the source of the infection was never really determined at that point. I believe he was sent to a skilled facility. He reports that at the hospital on 05/09/18 through 05/18/18. At that point he was noted to have a large abscess of the right buttock. He required a surgical IandD by Dr. Gerrit FriendsGerkin of general surgery. Culture of this grew Citrobacter,Eikenella and Bacteroides. He had a CT scan of the pelvis that showed an abnormal soft tissue collection just caudal to the right ischial tuberosity measuring 5.9 and by 4.7. This also involved the distal right deep muscles. There was some cortical bone loss on the right ischial tuberosity and there was some concern with osteomyelitis. He was felt to require treatment for osteomyelitis based on clinical and radiographic findings. He did not have an MRI. Past medical history includes;  abdominal aortic aneurysm, carotid stenosis, hypertension, osteoarthritis, interstitial lung disease on chronic steroids, rheumatoid arthritis, congestive heart failure/ischemic cardiomyopathy with an ejection fraction of 35-40% Also noteworthy is on 05/09/18 his albumin was 2.2 on admission to hospital. I don't think this is actually been repeated. The patient states his appetite is good and he is taking supplements 100%. 06/15/18-He is seen in follow up vibration for right buttock ulcer; this is stable in appearance with minimal amount of non-viable tissue. 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 We've been using a wound VAC. This is centered over the right ischial tuberosity. He is making generally good progress. He was severely hypoalbuminemic at one time although he states that he eats well and takes his supplements. 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 We have been using silver collagen over the superior aspect of the right ischial tuberosity under wound VAC.he has made some nice improvements. The dimensions of the wound are certainly smaller. He has no exposed bone. We received lab work from [facility]. His prealbumin was 18.6 and albumin at 2.57. This is obviously still low. I am not sure what interventions are in place. I know he is on supplements. I discussed this with the patient. I told him that the albumin represents already moderate to severe protein malnutrition. This would contribute it to do difficulty healing the wound 07/20/18; I received copies of lab work from [facility] on 07/06/18 documenting a sedimentation rate of 25 which seems to be in line with what I see  from previous values however his C-reactive protein was 110.8 versus 84.7 on 06/22/18. I have looked through [facility] health Link I  don't see a prior value for either inflammatory measure. he has been treated with [name] which I think he is still on for osteomyelitis of the right ischial tuberosity as well as a soft tissue collection just caudad to the right ischial tuberosity itself. Felt to have a soft tissue abscess and/or stigmata of necrotizing fasciitis. I'm not sure who is following this. His CT scan was on 05/09/18 yet he still remains on Invanz. Looking back through the original discharge summary the [name] was supposed to finish on 07/05/18. 08/03/18 the patient continues to make good improvement. The area over the right ischial tuberosity has filled in nicely. His developed some what looks to be candidal skin infection in the periwound. I think they've taken the wound VAC off and are [name], [name]. ([id number]) applying nystatin 08/17/18; 2 week follow-up for this resident from [facility]. They resumed the wound VAC. The area continues to fill in nicely. The only problem area is the tissue over the ischial tuberosity itself which needs to come up a bit. For this reason I'm continuing the wound VAC. Otherwise this is doing remarkably well. The patient states that he is eating everything they give him although I don't see a follow-up albumin. 08/31/18; the patient states he had blood work done today. I do not have these results. He claims to be eating well. He continues with a wound VAC and the wound is making nice progress. 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 This is coming quite nicely he still has one Onalee Hua that has some depth but I think by the next time we see him in 2 weeks we should be able to move on from the wound VAC. He is still at skilled care at [facility] place. As far as I am aware he is eating and drinking well. I have  not seen follow-up albumin studies on him recently but the message I'm getting is that he is eating well and taking his supplements. The patient states he is not very mobile and I am hopeful that we are able to insure pressure relief here however if we hadn't been ensuring pressure relief I doubt this would've closed in as well as it has even with the wound VAC. He does not complain of any systemic issues 09/28/2018 for follow-up and management of stage IV sacral ulcer that is being managed with a wound VAC. He is a resident at a long-term care facility, [facility]. The wound continues to have some depth and width some granulation of tissue on the outer borders of the wound. The wound was tender to touch today during exam. He denies any pain to the wound when it is not being touched. Recent fevers, chills, nausea, vomiting, or shortness of breath. Hopeful over the next couple of weeks he will be able to transition from the wound VAC. 10/19/2018; this patient has a stage IV over the right ischial tuberosity. The wound is come to the surface of the skin and looks very healthy at this point. His wound VAC can be discontinued. The patient apparently is eating well taking his supplements. He has no systemic complaints 11/23/2018. The patient has a stage IV wound over  his right ischial tuberosity that is now down to a quarter sized superficial area. There is no depth to this. The tissue looks healthy. No debridement was required. Continued improvement. We are using silver alginate and bordered foam 2/19; the original stage IV wound over his right ischial tuberosity. Continues to make nice progress in terms of dimensions. There is no depth to this. We have been using silver alginate and border foam 3/11; this is in follow-up of his original stage IV wound over his right ischial tuberosity. This continues to make nice progress using silver alginate and border foam vigorous offloading. He is at [facility] skilled facility. Patient complains of terrible pain in his hands and having trouble getting pain medication which he has to ask for as needed. He apparently has a history of rheumatoid arthritis and I note he is on prednisone and chloroquine. His rheumatologist is at [facility] clinic. Electronic Signature(s) Signed: 01/11/2019 5:42:07 PM By: Baltazar Najjar MD Entered By: Baltazar Najjar on 01/11/2019 16:53:52 Lagan, [name] ([id number]) -------------------------------------------------------------------------------- Physical Exam Details Patient Name: BUNNY, LOWDERMILK. Date of Service: 01/11/2019 2:30 PM Medical Record Number: [id number] Patient Account Number: [id number] Date of Birth/Sex: 1956/05/19 (63 y.o. M) Treating RN: Huel Coventry Primary Care Provider: Sindy Messing Other Clinician: Referring Provider: Sindy Messing Treating Provider/Extender: Altamese [place] in Treatment: 32 Constitutional Sitting or standing Blood Pressure is within target range for patient.. Pulse regular and within target range for patient.[name] Respirations regular, non-labored and within target range.. Temperature is normal and within the target range for the patient.[name] appears in no distress. Eyes Conjunctivae clear. No discharge. Respiratory Respiratory effort is easy and symmetric bilaterally. Rate is normal at rest and on room air.. Cardiovascular Appears euvolemic. Musculoskeletal Notable that he does have some deformities in the fingers of his hands tenderness across his PIPs and probably a small effusion in the right wrist. Integumentary (Hair, Skin) No other primary skin lesions are seen. Psychiatric Patient appears depressed today.. Notes Wound exam; the area over the right ischial tuberosity continues to be contracting nicely. No evidence of surrounding infection there is no undermining. Electronic Signature(s) Signed: 01/11/2019 5:42:07 PM By: Baltazar Najjar MD Entered By:  Baltazar Najjar on 01/11/2019 16:55:13 Sartin, [name] ([id number]) -------------------------------------------------------------------------------- Physician Orders Details Patient Name: CAESON, FILIPPI. Date of Service: 01/11/2019 2:30 PM Medical Record Number: [id number] Patient Account Number: [id number] Date of Birth/Sex: 29-Jan-1956 (63 y.o. M) Treating RN: Huel Coventry Primary Care Provider: Sindy Messing Other Clinician: Referring Provider: Sindy Messing Treating Provider/Extender: Altamese [place] in Treatment: 9 Verbal / Phone Orders: No Diagnosis Coding Wound Cleansing Wound #1 Right Gluteus o Clean wound with Normal Saline. Anesthetic (add to Medication List) Wound #1 Right Gluteus o Topical Lidocaine 4% cream applied to wound bed prior to debridement (In Clinic Only). Primary Wound Dressing Wound #1 Right Gluteus o Silver Alginate Secondary Dressing Wound #1 Right Gluteus o Boardered Foam Dressing Dressing Change Frequency Wound #1 Right Gluteus o Change dressing every other day. Follow-up Appointments Wound #1 Right Gluteus o Return Appointment in 3 weeks. Off-Loading Wound #1 Right Gluteus o Turn and reposition every 2 hours Electronic Signature(s) Signed: 01/11/2019 5:23:21 PM By: Elliot Gurney, BSN, RN, CWS, Kim RN, BSN Signed: 01/11/2019 5:42:07 PM By: Baltazar Najjar MD Entered By: Elliot Gurney, BSN, RN, CWS, Kim on 01/11/2019 15:11:03 ALEXA, GOLEBIEWSKI ([id number]) -------------------------------------------------------------------------------- Problem List Details Patient Name: SIGISMUND, CROSS. Date of Service: 01/11/2019 2:30 PM Medical Record Number: [id number] Patient Account Number: [id number] Date  of Birth/Sex: Apr 24, 1956 (62 y.o. M) Treating RN: Huel Coventry Primary Care Provider: Sindy Messing Other Clinician: Referring Provider: Sindy Messing Treating Provider/Extender: Altamese [place] in Treatment: 32 Active  Problems ICD-10 Evaluated Encounter Code Description Active Date Today Diagnosis L89.314 Pressure ulcer of right buttock, stage 4 06/01/2018 No Yes T81.31XD Disruption of external operation (surgical) wound, not 06/01/2018 No Yes elsewhere classified, subsequent encounter E43 Unspecified severe protein-calorie malnutrition 06/01/2018 No Yes Inactive Problems ICD-10 Code Description Active Date Inactive Date M86.18 Other acute osteomyelitis, other site 06/01/2018 06/01/2018 Resolved Problems Electronic Signature(s) Signed: 01/11/2019 5:42:07 PM By: Baltazar Najjar MD Entered By: Baltazar Najjar on 01/11/2019 16:52:30 Mccauslin, Rob W. ([id number]) -------------------------------------------------------------------------------- Progress Note Details Patient Name: Ryan Carrow. Date of Service: 01/11/2019 2:30 PM Medical Record Number: [id number] Patient Account Number: [id number] Date of Birth/Sex: June 22, 1956 (63 y.o. M) Treating RN: Huel Coventry Primary Care Provider: Sindy Messing Other Clinician: Referring Provider: Sindy Messing Treating Provider/Extender: Altamese [place] in Treatment: 32 Subjective History of Present Illness (HPI) ADMISSION 06/01/18 This is a 63 year old man who is sent to [place] currently residing at [facility] skilled facility. He has a complicated recent medical history. His sister accompanies him tells [place] that he has been somewhat disabled since suffering a left basal ganglial CVA in 2018. At that point in time he also had myocardial infarctions. More recently he has been hospitalized from 04/04/18 through 04/12/18 with septic shock, coronary artery disease, congestive heart failure and an acute CVA. During this hospitalization he was noted to have sacral wounds but these were not felt to be the source of infection however the source of the infection was never really determined at that point. I believe he was sent to a skilled facility. He reports that at the  hospital on 05/09/18 through 05/18/18. At that point he was noted to have a large abscess of the right buttock. He required a surgical IandD by Dr. Gerrit Friends of general surgery. Culture of this grew Citrobacter,Eikenella and Bacteroides. He had a CT scan of the pelvis that showed an abnormal soft tissue collection just caudal to the right ischial tuberosity measuring 5.9 and by 4.7. This also involved the distal right deep muscles. There was some cortical bone loss on the right ischial tuberosity and there was some concern with osteomyelitis. He was felt to require treatment for osteomyelitis based on clinical and radiographic findings. He did not have an MRI. Past medical history includes; abdominal aortic aneurysm, carotid stenosis, hypertension, osteoarthritis, interstitial lung disease on chronic steroids, rheumatoid arthritis, congestive heart failure/ischemic cardiomyopathy with an ejection fraction of 35-40% Also noteworthy is on 05/09/18 his albumin was 2.2 on admission to hospital. I don't think this is actually been repeated. The patient states his appetite is good and he is taking supplements 100%. 06/15/18-He is seen in follow up vibration for right buttock ulcer; this is stable in appearance with minimal amount of non-viable tissue. 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 We've been using a wound VAC. This is centered over the right ischial tuberosity. He is making generally good progress. He was severely hypoalbuminemic at one time although he states that he eats well and takes his supplements. 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 We have been using silver collagen over the superior aspect of the right ischial tuberosity under wound VAC.he  has made some nice improvements. The dimensions of the wound  are certainly smaller. He has no exposed bone. We received lab work from [facility]. His prealbumin was 18.6 and albumin at 2.57. This is obviously still low. I am not sure what interventions are in place. I know he is on supplements. I discussed this with the patient. I told him that the albumin represents already moderate to severe protein malnutrition. This would contribute it to do difficulty healing the wound 07/20/18; I received copies of lab work from [facility] on 07/06/18 documenting a sedimentation rate of 25 which seems to be in line with what I see from previous values however his C-reactive protein was 110.8 versus 84.7 on 06/22/18. I have looked through [facility] health Link I don't see a prior value for either inflammatory measure. he has been treated with [name] which I think he is still on for osteomyelitis of the right ischial tuberosity as well as a soft tissue collection just caudad to the right ischial tuberosity itself. Felt to have a soft tissue abscess and/or stigmata of necrotizing fasciitis. I'm not sure who is following this. His CT scan was on 05/09/18 yet he still remains on Invanz. Looking back through the original discharge summary the [name] was supposed to finish on 07/05/18. 08/03/18 the patient continues to make good improvement. The area over the right ischial tuberosity has filled in nicely. His developed some what looks to be candidal skin infection in the periwound. I think they've taken the wound VAC off and are applying nystatin 08/17/18; 2 week follow-up for this resident from [facility]. They resumed the wound VAC. The area continues to fill in nicely. The only problem area is the tissue over the ischial tuberosity itself which needs to come up a bit. For this reason Jentry Warnell, Joann W. ([id number]) continuing the wound VAC. Otherwise this is doing remarkably well. The patient states that he is eating everything they give him although I don't see a follow-up  albumin. 08/31/18; the patient states he had blood work done today. I do not have these results. He claims to be eating well. He continues with a wound VAC and the wound is making nice progress. 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 This is coming quite nicely he still has one Onalee Hua that has some depth but I think by the next time we see him in 2 weeks we should be able to move on from the wound VAC. He is still at skilled care at [facility] place. As far as I am aware he is eating and drinking well. I have not seen follow-up albumin studies on him recently but the message I'm getting is that he is eating well and taking his supplements. The patient states he is not very mobile and I am hopeful that we are able to insure pressure relief here however if we hadn't been ensuring pressure relief I doubt this would've closed in as well as it has even with the wound VAC. He does not complain of any systemic issues 09/28/2018 for follow-up and management of stage IV sacral ulcer that is being managed with a wound VAC. He is a resident at a long-term care facility, [facility]. The wound continues to have some depth and width some granulation of tissue on the outer  borders of the wound. The wound was tender to touch today during exam. He denies any pain to the wound when it is not being touched. Recent fevers, chills, nausea, vomiting, or shortness of breath. Hopeful over the next couple of weeks he will be able to transition from the wound VAC. 10/19/2018; this patient has a stage IV over the right ischial tuberosity. The wound is come to the surface of the skin and looks very healthy at this point. His wound VAC can be discontinued. The patient apparently is eating well taking his supplements. He has no systemic  complaints 11/23/2018. The patient has a stage IV wound over his right ischial tuberosity that is now down to a quarter sized superficial area. There is no depth to this. The tissue looks healthy. No debridement was required. Continued improvement. We are using silver alginate and bordered foam 2/19; the original stage IV wound over his right ischial tuberosity. Continues to make nice progress in terms of dimensions. There is no depth to this. We have been using silver alginate and border foam 3/11; this is in follow-up of his original stage IV wound over his right ischial tuberosity. This continues to make nice progress using silver alginate and border foam vigorous offloading. He is at [facility] skilled facility. Patient complains of terrible pain in his hands and having trouble getting pain medication which he has to ask for as needed. He apparently has a history of rheumatoid arthritis and I note he is on prednisone and chloroquine. His rheumatologist is at [facility] clinic. Objective Constitutional Sitting or standing Blood Pressure is within target range for patient.. Pulse regular and within target range for patient.[name] Respirations regular, non-labored and within target range.. Temperature is normal and within the target range for the patient.[name] appears in no distress. Vitals Time Taken: 3:27 PM, Height: 73 in, Weight: 206 lbs, BMI: 27.2, Temperature: 97.9 F, Pulse: 101 bpm, Respiratory Rate: 16 breaths/min, Blood Pressure: 113/79 mmHg. Eyes Conjunctivae clear. No discharge. Respiratory Respiratory effort is easy and symmetric bilaterally. Rate is normal at rest and on room air.. Cardiovascular Prewitt, Dervin W. ([id number]) Appears euvolemic. Musculoskeletal Notable that he does have some deformities in the fingers of his hands tenderness across his PIPs and probably a small effusion in the right wrist. Psychiatric Patient appears depressed today.. General Notes: Wound exam; the  area over the right ischial tuberosity continues to be contracting nicely. No evidence of surrounding infection there is no undermining. Integumentary (Hair, Skin) No other primary skin lesions are seen. Wound #1 status is Open. Original cause of wound was Gradually Appeared. The wound is located on the Right Gluteus. The wound measures 1.3cm length x 0.9cm width x 0.3cm depth; 0.919cm^2 area and 0.276cm^3 volume. There is bone, muscle, Fat Layer (Subcutaneous Tissue) Exposed, and fascia exposed. There is no tunneling noted, however, there is undermining starting at 9:00 and ending at 3:00 with a maximum distance of 0.5cm. There is a small amount of serous drainage noted. The wound margin is distinct with the outline attached to the wound base. There is no granulation within the wound bed. There is a medium (34-66%) amount of necrotic tissue within the wound bed including Adherent Slough. The periwound skin appearance exhibited: Scarring. The periwound skin appearance did not exhibit: Callus, Crepitus, Excoriation, Induration, Rash, Dry/Scaly, Maceration, Atrophie Blanche, Cyanosis, Ecchymosis, Hemosiderin Staining, Mottled, Pallor, Rubor, Erythema. Periwound temperature was noted as No Abnormality. The periwound has tenderness on palpation. Assessment Active Problems ICD-10 Pressure ulcer of right  buttock, stage 4 Disruption of external operation (surgical) wound, not elsewhere classified, subsequent encounter Unspecified severe protein-calorie malnutrition Plan Wound Cleansing: Wound #1 Right Gluteus: Clean wound with Normal Saline. Anesthetic (add to Medication List): Wound #1 Right Gluteus: Topical Lidocaine 4% cream applied to wound bed prior to debridement (In Clinic Only). Primary Wound Dressing: Wound #1 Right Gluteus: Silver Alginate Secondary Dressing: Wound #1 Right Gluteus: Boardered Foam Dressing Dressing Change Frequency: [name], Junius W. ([id number]) Wound #1 Right  Gluteus: Change dressing every other day. Follow-up Appointments: Wound #1 Right Gluteus: Return Appointment in 3 weeks. Off-Loading: Wound #1 Right Gluteus: Turn and reposition every 2 hours 1. Continue with silver alginate to the right gluteus which is making nice contraction 2. I think the patient's pain may be rheumatoid mediated. I written a note to the nursing home to consider referral to his rheumatologist Electronic Signature(s) Signed: 01/11/2019 5:42:07 PM By: Baltazar Najjarobson, Demauri Advincula MD Entered By: Baltazar Najjarobson, Aprill Banko on 01/11/2019 16:56:34 Mcnelly, Loreli SlotPHILLIP W. ([id number]) -------------------------------------------------------------------------------- SuperBill Details Patient Name: [name], Jong W. Date of Service: 01/11/2019 Medical Record Number: [id number] Patient Account Number: [id number] Date of Birth/Sex: 1956/09/22 6(62 y.o. M) Treating RN: Huel CoventryWoody, Kim Primary Care Provider: Sindy MessingGOMEZ, ROGER Other Clinician: Referring Provider: Sindy MessingGOMEZ, ROGER Treating Provider/Extender: Altamese CarolinaOBSON, [place] Bohac G Weeks in Treatment: 32 Diagnosis Coding ICD-10 Codes Code Description L89.314 Pressure ulcer of right buttock, stage 4 T81.31XD Disruption of external operation (surgical) wound, not elsewhere classified, subsequent encounter E43 Unspecified severe protein-calorie malnutrition Facility Procedures CPT4 Code: [id number] Description: 99213 - WOUND CARE VISIT-LEV 3 EST PT Modifier: Quantity: 1 Physician Procedures CPT4: Description Modifier Quantity Code [id number] 99213 - WC PHYS LEVEL 3 - EST PT 1 ICD-10 Diagnosis Description L89.314 Pressure ulcer of right buttock, stage 4 T81.31XD Disruption of external operation (surgical) wound, not elsewhere classified,  subsequent encounter Electronic Signature(s) Signed: 01/11/2019 5:42:07 PM By: Baltazar Najjarobson, Christobal Morado MD Entered By: Baltazar Najjarobson, Teandra Harlan on 01/11/2019 16:56:51

## 2019-02-01 ENCOUNTER — Ambulatory Visit: Payer: Medicaid Other | Admitting: Internal Medicine

## 2019-02-07 ENCOUNTER — Other Ambulatory Visit (HOSPITAL_COMMUNITY): Payer: Medicaid Other

## 2019-02-07 ENCOUNTER — Ambulatory Visit: Payer: Medicaid Other | Admitting: Family

## 2019-02-08 ENCOUNTER — Encounter: Payer: Medicaid Other | Attending: Internal Medicine | Admitting: Internal Medicine

## 2019-02-08 ENCOUNTER — Other Ambulatory Visit: Payer: Self-pay

## 2019-02-08 DIAGNOSIS — I509 Heart failure, unspecified: Secondary | ICD-10-CM | POA: Diagnosis not present

## 2019-02-08 DIAGNOSIS — L89314 Pressure ulcer of right buttock, stage 4: Secondary | ICD-10-CM | POA: Diagnosis present

## 2019-02-08 DIAGNOSIS — I714 Abdominal aortic aneurysm, without rupture: Secondary | ICD-10-CM | POA: Diagnosis not present

## 2019-02-08 DIAGNOSIS — I252 Old myocardial infarction: Secondary | ICD-10-CM | POA: Diagnosis not present

## 2019-02-08 DIAGNOSIS — M8580 Other specified disorders of bone density and structure, unspecified site: Secondary | ICD-10-CM | POA: Insufficient documentation

## 2019-02-08 DIAGNOSIS — M069 Rheumatoid arthritis, unspecified: Secondary | ICD-10-CM | POA: Insufficient documentation

## 2019-02-08 DIAGNOSIS — Z7952 Long term (current) use of systemic steroids: Secondary | ICD-10-CM | POA: Insufficient documentation

## 2019-02-08 DIAGNOSIS — E43 Unspecified severe protein-calorie malnutrition: Secondary | ICD-10-CM | POA: Insufficient documentation

## 2019-02-08 DIAGNOSIS — T8131XA Disruption of external operation (surgical) wound, not elsewhere classified, initial encounter: Secondary | ICD-10-CM | POA: Diagnosis not present

## 2019-02-08 DIAGNOSIS — I2510 Atherosclerotic heart disease of native coronary artery without angina pectoris: Secondary | ICD-10-CM | POA: Diagnosis not present

## 2019-02-08 DIAGNOSIS — I255 Ischemic cardiomyopathy: Secondary | ICD-10-CM | POA: Diagnosis not present

## 2019-02-08 DIAGNOSIS — I110 Hypertensive heart disease with heart failure: Secondary | ICD-10-CM | POA: Insufficient documentation

## 2019-02-08 DIAGNOSIS — Z8673 Personal history of transient ischemic attack (TIA), and cerebral infarction without residual deficits: Secondary | ICD-10-CM | POA: Diagnosis not present

## 2019-02-08 DIAGNOSIS — J849 Interstitial pulmonary disease, unspecified: Secondary | ICD-10-CM | POA: Diagnosis not present

## 2019-02-08 NOTE — Progress Notes (Signed)
[name], [name]. ([id number]) Visit Report for 02/08/2019 HPI Details Patient Name: [name], [name] Date of Service: 02/08/2019 12:45 PM Medical Record Number: [id number] Patient Account Number: [id number] Date of Birth/Sex: [birth year] (62 y.o. M) Treating RN: Huel CoventryWoody, Kim Primary Care Provider: Sindy MessingGOMEZ, ROGER Other Clinician: Referring Provider: Sindy MessingGOMEZ, ROGER Treating Provider/Extender: Altamese CarolinaOBSON, Alix Stowers G Weeks in Treatment: 4236 History of Present Illness HPI Description: ADMISSION 06/01/18 This is a 63 year old man who is sent to us currently residing at [facility] skilled facility. He has a complicated recent medical history. His sister accompanies him tells us that he has been somewhat disabled since suffering a left basal ganglial CVA in 2018. At that point in time he also had myocardial infarctions. More recently he has been hospitalized from 04/04/18 through 04/12/18 with septic shock, coronary artery disease, congestive heart failure and an acute CVA. During this hospitalization he was noted to have sacral wounds but these were not felt to be the source of infection however the source of the infection was never really determined at that point. I believe he was sent to a skilled facility. He reports that at the hospital on 05/09/18 through 05/18/18. At that point he was noted to have a large abscess of the right buttock. He required a surgical IandD by Dr. Gerrit FriendsGerkin of general surgery. Culture of this grew Citrobacter,Eikenella and Bacteroides. He had a CT scan of the pelvis that showed an abnormal soft tissue collection just caudal to the right ischial tuberosity measuring 5.9 and by 4.7. This also involved the distal right deep muscles. There was some cortical bone loss on the right ischial tuberosity and there was some concern with osteomyelitis. He was felt to require treatment for osteomyelitis based on clinical and radiographic findings. He did not have an MRI. Past medical history includes;  abdominal aortic aneurysm, carotid stenosis, hypertension, osteoarthritis, interstitial lung disease on chronic steroids, rheumatoid arthritis, congestive heart failure/ischemic cardiomyopathy with an ejection fraction of 35-40% Also noteworthy is on 05/09/18 his albumin was 2.2 on admission to hospital. I don't think this is actually been repeated. The patient states his appetite is good and he is taking supplements 100%. 06/15/18-He is seen in follow up vibration for right buttock ulcer; this is stable in appearance with minimal amount of non-viable tissue. 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 We've been using a wound VAC. This is centered over the right ischial tuberosity. He is making generally good progress. He was severely hypoalbuminemic at one time although he states that he eats well and takes his supplements. 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 We have been using silver collagen over the superior aspect of the right ischial tuberosity under wound VAC.he has made some nice improvements. The dimensions of the wound are certainly smaller. He has no exposed bone. We received lab work from [facility]. His prealbumin was 18.6 and albumin at 2.57. This is obviously still low. I am not sure what interventions are in place. I know he is on supplements. I discussed this with the patient. I told him that the albumin represents already moderate to severe protein malnutrition. This would contribute it to do difficulty healing the wound 07/20/18; I received copies of lab work from [facility] on 07/06/18 documenting a sedimentation rate of 25 which seems to be in line with what I see  from previous values however his C-reactive protein was 110.8 versus 84.7 on 06/22/18. I have looked through [facility] health Link I  don't see a prior value for either inflammatory measure. he has been treated with [name] which I think he is still on for osteomyelitis of the right ischial tuberosity as well as a soft tissue collection just caudad to the right ischial tuberosity itself. Felt to have a soft tissue abscess and/or stigmata of necrotizing fasciitis. I'm not sure who is following this. His CT scan was on 05/09/18 yet he still remains on Invanz. Looking back through the original discharge summary the [name] was supposed to finish on 07/05/18. 08/03/18 the patient continues to make good improvement. The area over the right ischial tuberosity has filled in nicely. His developed some what looks to be candidal skin infection in the periwound. I think they've taken the wound VAC off and are [name], [name]. ([id number]) applying nystatin 08/17/18; 2 week follow-up for this resident from [facility]. They resumed the wound VAC. The area continues to fill in nicely. The only problem area is the tissue over the ischial tuberosity itself which needs to come up a bit. For this reason I'm continuing the wound VAC. Otherwise this is doing remarkably well. The patient states that he is eating everything they give him although I don't see a follow-up albumin. 08/31/18; the patient states he had blood work done today. I do not have these results. He claims to be eating well. He continues with a wound VAC and the wound is making nice progress. 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 This is coming quite nicely he still has one Onalee Hua that has some depth but I think by the next time we see him in 2 weeks we should be able to move on from the wound VAC. He is still at skilled care at [facility] place. As far as I am aware he is eating and drinking well. I have  not seen follow-up albumin studies on him recently but the message I'm getting is that he is eating well and taking his supplements. The patient states he is not very mobile and I am hopeful that we are able to insure pressure relief here however if we hadn't been ensuring pressure relief I doubt this would've closed in as well as it has even with the wound VAC. He does not complain of any systemic issues 09/28/2018 for follow-up and management of stage IV sacral ulcer that is being managed with a wound VAC. He is a resident at a long-term care facility, [facility]. The wound continues to have some depth and width some granulation of tissue on the outer borders of the wound. The wound was tender to touch today during exam. He denies any pain to the wound when it is not being touched. Recent fevers, chills, nausea, vomiting, or shortness of breath. Hopeful over the next couple of weeks he will be able to transition from the wound VAC. 10/19/2018; this patient has a stage IV over the right ischial tuberosity. The wound is come to the surface of the skin and looks very healthy at this point. His wound VAC can be discontinued. The patient apparently is eating well taking his supplements. He has no systemic complaints 11/23/2018. The patient has a stage IV wound over  his right ischial tuberosity that is now down to a quarter sized superficial area. There is no depth to this. The tissue looks healthy. No debridement was required. Continued improvement. We are using silver alginate and bordered foam 2/19; the original stage IV wound over his right ischial tuberosity. Continues to make nice progress in terms of dimensions. There is no depth to this. We have been using silver alginate and border foam 3/11; this is in follow-up of his original stage IV wound over his right ischial tuberosity. This continues to make nice progress using silver alginate and border foam vigorous offloading. He is at [facility] skilled facility. Patient complains of terrible pain in his hands and having trouble getting pain medication which he has to ask for as needed. He apparently has a history of rheumatoid arthritis and I note he is on prednisone and chloroquine. [facility] rheumat[facility]. 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 This continues to make decent progress is smaller. There is some undermining at roughly 12-3 o'clock. We have been using silver alginate Electronic Signature(s) Signed: 02/08/2019 3:44:34 PM By: Baltazar Najjarobson, Saniyya Gau MD Entered By: Baltazar Najjarobson, Chriss Mannan on 02/08/2019 14:00:30 Levit, Loreli SlotPHILLIP [name]. ([id number]) -------------------------------------------------------------------------------- Physical Exam Details Patient Name: [name], Bannon [name]. Date of Service: 02/08/2019 12:45 PM Medical Record Number: [id number] Patient Account Number: [id number] Date of Birth/Sex: [birth year] (62 y.o. M) Treating RN: Huel CoventryWoody, Kim Primary Care Provider: Sindy MessingGOMEZ, ROGER Other Clinician: Referring Provider: Sindy MessingGOMEZ, ROGER Treating Provider/Extender: Altamese CarolinaOBSON, Clova Morlock G Weeks in Treatment: 36 Constitutional Sitting or standing Blood Pressure is within target range for patient.. Pulse regular and within target range for patient.[name]. Respirations regular, non-labored and within target range.. Temperature is normal and within the target range for the patient.[name]. appears in no distress. Respiratory Respiratory effort is easy and symmetric bilaterally. Rate is normal at rest and on room air.. Cardiovascular Heart rhythm and rate regular, without murmur or gallop.. Integumentary (Hair, Skin) No surrounding erythema around the wound. Psychiatric No evidence of depression, anxiety, or agitation. Calm, cooperative, and communicative. Appropriate interactions and affect.. Notes Wound exam; the areas over the right ischial  tuberosity. It is contracted quite a bit. There is an undermining area. Also a strip of epithelialization across the surface and a small area. Wound looks somewhat dry Electronic Signature(s) Signed: 02/08/2019 3:44:34 PM By: Baltazar Najjarobson, Morry Veiga MD Entered By: Baltazar Najjarobson, Takai Chiaramonte on 02/08/2019 14:03:15 Stefanelli, Loreli SlotPHILLIP [name]. ([id number]) -------------------------------------------------------------------------------- Physician Orders Details Patient Name: [name], Gearl [name]. Date of Service: 02/08/2019 12:45 PM Medical Record Number: [id number] Patient Account Number: [id number] Date of Birth/Sex: [birth year] (62 y.o. M) Treating RN: Huel CoventryWoody, Kim Primary Care Provider: Sindy MessingGOMEZ, ROGER Other Clinician: Referring Provider: Sindy MessingGOMEZ, ROGER Treating Provider/Extender: Altamese CarolinaOBSON, Cassady Turano G Weeks in Treatment: 2536 Verbal / Phone Orders: No Diagnosis Coding Wound Cleansing Wound #1 Right Gluteus o Clean wound with Normal Saline. Anesthetic (add to Medication List) Wound #1 Right Gluteus o Topical Lidocaine 4% cream applied to wound bed prior to debridement (In Clinic Only). Primary Wound Dressing Wound #1 Right Gluteus o Silver Collagen - moistened with hydrogel/ky jelly Secondary Dressing Wound #1 Right Gluteus o Boardered Foam Dressing Dressing Change Frequency Wound #1 Right Gluteus o Change dressing every other day. Follow-up Appointments Wound #1 Right Gluteus o Return Appointment in 3 weeks. Off-Loading Wound #1 Right Gluteus o Turn and reposition every 2 hours Electronic Signature(s) Signed: 02/08/2019 3:44:34 PM By: Baltazar Najjarobson, Deonna Krummel  MD Signed: 02/08/2019 4:47:28 PM By: Elliot Gurney, BSN, RN, CWS, Kim RN, BSN Entered By: Elliot Gurney, BSN, RN, CWS, Kim on 02/08/2019 13:20:48 [name] ([id number]) -------------------------------------------------------------------------------- Problem List Details Patient Name: NARENDER, PEDUZZI. Date of Service: 02/08/2019 12:45 PM Medical Record Number:  [id number] Patient Account Number: [id number] Date of Birth/Sex: [birth year] (62 y.o. M) Treating RN: Huel Coventry Primary Care Provider: Sindy Messing Other Clinician: Referring Provider: Sindy Messing Treating Provider/Extender: Altamese [place] in Treatment: 36 Active Problems ICD-10 Evaluated Encounter Code Description Active Date Today Diagnosis L89.314 Pressure ulcer of right buttock, stage 4 06/01/2018 No Yes T81.31XD Disruption of external operation (surgical) wound, not 06/01/2018 No Yes elsewhere classified, subsequent encounter E43 Unspecified severe protein-calorie malnutrition 06/01/2018 No Yes Inactive Problems ICD-10 Code Description Active Date Inactive Date M86.18 Other acute osteomyelitis, other site 06/01/2018 06/01/2018 Resolved Problems Electronic Signature(s) Signed: 02/08/2019 3:44:34 PM By: Baltazar Najjar MD Entered By: Baltazar Najjar on 02/08/2019 13:48:02 Mehlhaff, Lonnell [name]. ([id number]) -------------------------------------------------------------------------------- Progress Note Details Patient Name: [name] Date of Service: 02/08/2019 12:45 PM Medical Record Number: [id number] Patient Account Number: [id number] Date of Birth/Sex: [birth year] (62 y.o. M) Treating RN: Huel Coventry Primary Care Provider: Sindy Messing Other Clinician: Referring Provider: Sindy Messing Treating Provider/Extender: Altamese [place] in Treatment: 36 Subjective History of Present Illness (HPI) ADMISSION 06/01/18 This is a 63 year old man who is sent to [place] currently residing at [facility] skilled facility. He has a complicated recent medical history. His sister accompanies him tells [place] that he has been somewhat disabled since suffering a left basal ganglial CVA in 2018. At that point in time he also had myocardial infarctions. More recently he has been hospitalized from 04/04/18 through 04/12/18 with septic shock, coronary artery disease, congestive heart failure and an  acute CVA. During this hospitalization he was noted to have sacral wounds but these were not felt to be the source of infection however the source of the infection was never really determined at that point. I believe he was sent to a skilled facility. He reports that at the hospital on 05/09/18 through 05/18/18. At that point he was noted to have a large abscess of the right buttock. He required a surgical IandD by Dr. Gerrit Friends of general surgery. Culture of this grew Citrobacter,Eikenella and Bacteroides. He had a CT scan of the pelvis that showed an abnormal soft tissue collection just caudal to the right ischial tuberosity measuring 5.9 and by 4.7. This also involved the distal right deep muscles. There was some cortical bone loss on the right ischial tuberosity and there was some concern with osteomyelitis. He was felt to require treatment for osteomyelitis based on clinical and radiographic findings. He did not have an MRI. Past medical history includes; abdominal aortic aneurysm, carotid stenosis, hypertension, osteoarthritis, interstitial lung disease on chronic steroids, rheumatoid arthritis, congestive heart failure/ischemic cardiomyopathy with an ejection fraction of 35-40% Also noteworthy is on 05/09/18 his albumin was 2.2 on admission to hospital. I don't think this is actually been repeated. The patient states his appetite is good and he is taking supplements 100%. 06/15/18-He is seen in follow up vibration for right buttock ulcer; this is stable in appearance with minimal amount of non-viable tissue. 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 We've been using a wound VAC. This is centered over the right ischial tuberosity. He is making generally good progress.  He was severely hypoalbuminemic at one time although he states that he eats well and takes his  supplements. 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 We have been using silver collagen over the superior aspect of the right ischial tuberosity under wound VAC.he has made some nice improvements. The dimensions of the wound are certainly smaller. He has no exposed bone. We received lab work from [facility]. His prealbumin was 18.6 and albumin at 2.57. This is obviously still low. I am not sure what interventions are in place. I know he is on supplements. I discussed this with the patient. I told him that the albumin represents already moderate to severe protein malnutrition. This would contribute it to do difficulty healing the wound 07/20/18; I received copies of lab work from [facility] on 07/06/18 documenting a sedimentation rate of 25 which seems to be in line with what I see from previous values however his C-reactive protein was 110.8 versus 84.7 on 06/22/18. I have looked through [facility] health Link I don't see a prior value for either inflammatory measure. he has been treated with [name] which I think he is still on for osteomyelitis of the right ischial tuberosity as well as a soft tissue collection just caudad to the right ischial tuberosity itself. Felt to have a soft tissue abscess and/or stigmata of necrotizing fasciitis. I'm not sure who is following this. His CT scan was on 05/09/18 yet he still remains on Invanz. Looking back through the original discharge summary the [name] was supposed to finish on 07/05/18. 08/03/18 the patient continues to make good improvement. The area over the right ischial tuberosity has filled in nicely. His developed some what looks to be candidal skin infection in the periwound. I think they've taken the wound VAC off and are applying nystatin 08/17/18; 2 week follow-up for this resident from [facility]. They resumed the wound VAC. The area continues to fill in nicely. The only problem area is the tissue  over the ischial tuberosity itself which needs to come up a bit. For this reason Kaion Tisdale, Kyrel [name]. ([id number]) continuing the wound VAC. Otherwise this is doing remarkably well. The patient states that he is eating everything they give him although I don't see a follow-up albumin. 08/31/18; the patient states he had blood work done today. I do not have these results. He claims to be eating well. He continues with a wound VAC and the wound is making nice progress. 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 This is coming quite nicely he still has one Onalee Hua that has some depth but I think by the next time we see him in 2 weeks we should be able to move on from the wound VAC. He is still at skilled care at [facility]. As far as I am aware he is eating and drinking well. I have not seen follow-up albumin studies on him recently but the message I'm getting is that he is eating well and taking his supplements. The patient states he is not very mobile and I am hopeful that we are able to insure pressure relief here however if we hadn't been ensuring pressure relief I doubt this would've closed in as well as it has even with the wound VAC. He does  not complain of any systemic issues 09/28/2018 for follow-up and management of stage IV sacral ulcer that is being managed with a wound VAC. He is a resident at a long-term care facility, [facility]. The wound continues to have some depth and width some granulation of tissue on the outer borders of the wound. The wound was tender to touch today during exam. He denies any pain to the wound when it is not being touched. Recent fevers, chills, nausea, vomiting, or shortness of breath. Hopeful over the next couple of weeks he will be able to transition from the wound  VAC. 10/19/2018; this patient has a stage IV over the right ischial tuberosity. The wound is come to the surface of the skin and looks very healthy at this point. His wound VAC can be discontinued. The patient apparently is eating well taking his supplements. He has no systemic complaints 11/23/2018. The patient has a stage IV wound over his right ischial tuberosity that is now down to a quarter sized superficial area. There is no depth to this. The tissue looks healthy. No debridement was required. Continued improvement. We are using silver alginate and bordered foam 2/19; the original stage IV wound over his right ischial tuberosity. Continues to make nice progress in terms of dimensions. There is no depth to this. We have been using silver alginate and border foam 3/11; this is in follow-up of his original stage IV wound over his right ischial tuberosity. This continues to make nice progress using silver alginate and border foam vigorous offloading. He is at [facility] skilled facility. Patient complains of terrible pain in his hands and having trouble getting pain medication which he has to ask for as needed. He apparently has a history of rheumatoid arthritis and I note he is on prednisone and chloroquine. His rheumatologist is at [facility] clinic. 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 This continues to make decent progress is smaller. There is some undermining at roughly 12-3 o'clock. We have been using silver alginate Objective Constitutional Sitting or standing Blood Pressure is within target range for patient.. Pulse regular and within target range for patient.[name] Respirations regular, non-labored and within target range.. Temperature is normal and within the target range for the patient.[name] appears in no distress. Vitals Time Taken: 1:02 PM, Height: 73 in, Weight: 206 lbs, BMI: 27.2,  Temperature: 98.2 F, Pulse: 93 bpm, Respiratory Rate: 16 breaths/min, Blood Pressure: 111/64 mmHg. Respiratory Respiratory effort is easy and symmetric bilaterally. Rate is normal at rest and on room air.. Cardiovascular Forand, Sylis [name]. ([id number]) Heart rhythm and rate regular, without murmur or gallop.[name] Psychiatric No evidence of depression, anxiety, or agitation. Calm, cooperative, and communicative. Appropriate interactions and affect.. General Notes: Wound exam; the areas over the right ischial tuberosity. It is contracted quite a bit. There is an undermining area. Also a strip of epithelialization across the surface and a small area. Wound looks somewhat dry Integumentary (Hair, Skin) No surrounding erythema around the wound. Wound #1 status is Open. Original cause of wound was Gradually Appeared. The wound is located on the Right Gluteus. The wound measures 1cm length x 0.5cm width x 0.1cm depth; 0.393cm^2 area and 0.039cm^3 volume. There is bone, muscle, Fat Layer (Subcutaneous Tissue) Exposed, and fascia exposed. There is no tunneling or undermining noted. There is a small amount of serous drainage noted. The wound margin is distinct with  the outline attached to the wound base. There is large (67-100%) pink granulation within the wound bed. There is a small (1-33%) amount of necrotic tissue within the wound bed including Adherent Slough. The periwound skin appearance exhibited: Scarring. The periwound skin appearance did not exhibit: Callus, Crepitus, Excoriation, Induration, Rash, Dry/Scaly, Maceration, Atrophie Blanche, Cyanosis, Ecchymosis, Hemosiderin Staining, Mottled, Pallor, Rubor, Erythema. Periwound temperature was noted as No Abnormality. The periwound has tenderness on palpation. Assessment Active Problems ICD-10 Pressure ulcer of right buttock, stage 4 Disruption of external operation (surgical) wound, not elsewhere classified, subsequent encounter Unspecified  severe protein-calorie malnutrition Plan Wound Cleansing: Wound #1 Right Gluteus: Clean wound with Normal Saline. Anesthetic (add to Medication List): Wound #1 Right Gluteus: Topical Lidocaine 4% cream applied to wound bed prior to debridement (In Clinic Only). Primary Wound Dressing: Wound #1 Right Gluteus: Silver Collagen - moistened with hydrogel/ky jelly Secondary Dressing: Wound #1 Right Gluteus: Boardered Foam Dressing Dressing Change Frequency: Wound #1 Right Gluteus: Change dressing every other day. Follow-up Appointments: Wound #1 Right Gluteus: ELOISE, MULA. ([id number]) Return Appointment in 3 weeks. Off-Loading: Wound #1 Right Gluteus: Turn and reposition every 2 hours 1. I change the primary dressing to silver collagen from silver alginate 2. Hopefully to stimulate some closure of the undermining area 3. No evidence of surrounding infection Electronic Signature(s) Signed: 02/08/2019 3:44:34 PM By: Baltazar Najjar MD Entered By: Baltazar Najjar on 02/08/2019 14:04:13 Pascal, [name] ([id number]) -------------------------------------------------------------------------------- SuperBill Details Patient Name: [name] Date of Service: 02/08/2019 Medical Record Number: [id number] Patient Account Number: [id number] Date of Birth/Sex: Jul 04, 1956 (63 y.o. M) Treating RN: Huel Coventry Primary Care Provider: Sindy Messing Other Clinician: Referring Provider: Sindy Messing Treating Provider/Extender: Altamese [place] in Treatment: 36 Diagnosis Coding ICD-10 Codes Code Description L89.314 Pressure ulcer of right buttock, stage 4 T81.31XD Disruption of external operation (surgical) wound, not elsewhere classified, subsequent encounter E43 Unspecified severe protein-calorie malnutrition Facility Procedures CPT4 Code: 08657846 Description: [id number] - WOUND CARE VISIT-LEV 2 EST PT Modifier: Quantity: 1 Physician Procedures CPT4 Code: 2841324 Description:  40102 - WC PHYS LEVEL 2 - EST PT ICD-10 Diagnosis Description L89.314 Pressure ulcer of right buttock, stage 4 Modifier: Quantity: 1 Electronic Signature(s) Signed: 02/08/2019 3:44:34 PM By: Baltazar Najjar MD Entered By: Baltazar Najjar on 02/08/2019 15:26:31

## 2019-02-08 NOTE — Progress Notes (Signed)
[name] ([id number]) Visit Report for 02/08/2019 Arrival Information Details Patient Name: [name], [name] Date of Service: 02/08/2019 12:45 PM Medical Record Number: [id number] Patient Account Number: [id number] Date of Birth/Sex: [birth year] (62 y.o. M) Treating RN: [name] Primary Care [name]: [name] Other Clinician: Referring [name]: [name] Treating [name]/Extender: [name] in Treatment: 36 Visit Information History Since Last Visit Added or deleted any medications: No Patient Arrived: Wheel Chair Any new allergies or adverse reactions: No Arrival Time: 12:59 Had a fall or experienced change in No activities of daily living that may affect Accompanied By: self risk of falls: Transfer Assistance: Hoyer Lift Signs or symptoms of abuse/neglect since last visito No Patient Identification Verified: Yes Hospitalized since last visit: No Secondary Verification Process Completed: Yes Implantable device outside of the clinic excluding No Patient Requires Transmission-Based No cellular tissue based products placed in the center Precautions: since last visit: Patient Has Alerts: No Has Dressing in Place as Prescribed: Yes Pain Present Now: No Electronic Signature(s) Signed: 02/08/2019 2:47:29 PM By: [name] Entered By: [name] on 02/08/2019 13:08:54 [name], [name] ([id number]) -------------------------------------------------------------------------------- Clinic Level of Care Assessment Details Patient Name: [name] Date of Service: 02/08/2019 12:45 PM Medical Record Number: [id number] Patient Account Number: [id number] Date of Birth/Sex: 1956-04-30 (62 y.o. M) Treating RN: [name] Primary Care Jovian Lembcke: [name] Other Clinician: Referring [name]: [name] Treating [name]/Extender: [name] in Treatment: 36 Clinic Level of Care Assessment Items TOOL 4 Quantity Score []  - Use  when only an EandM is performed on FOLLOW-UP visit 0 ASSESSMENTS - Nursing Assessment / Reassessment []  - Reassessment of Co-morbidities (includes updates in patient status) 0 X- 1 5 Reassessment of Adherence to Treatment Plan ASSESSMENTS - Wound and Skin Assessment / Reassessment X - Simple Wound Assessment / Reassessment - one wound 1 5 []  - 0 Complex Wound Assessment / Reassessment - multiple wounds []  - 0 Dermatologic / Skin Assessment (not related to wound area) ASSESSMENTS - Focused Assessment []  - Circumferential Edema Measurements - multi extremities 0 []  - 0 Nutritional Assessment / Counseling / Intervention []  - 0 Lower Extremity Assessment (monofilament, tuning fork, pulses) []  - 0 Peripheral Arterial Disease Assessment (using hand held doppler) ASSESSMENTS - Ostomy and/or Continence Assessment and Care []  - Incontinence Assessment and Management 0 []  - 0 Ostomy Care Assessment and Management (repouching, etc.) PROCESS - Coordination of Care X - Simple Patient / Family Education for ongoing care 1 15 []  - 0 Complex (extensive) Patient / Family Education for ongoing care []  - 0 Staff obtains Chiropractor, Records, Test Results / Process Orders []  - 0 Staff telephones HHA, Nursing Homes / Clarify orders / etc []  - 0 Routine Transfer to another Facility (non-emergent condition) []  - 0 Routine Hospital Admission (non-emergent condition) []  - 0 New Admissions / Manufacturing engineer / Ordering NPWT, Apligraf, etc. []  - 0 Emergency Hospital Admission (emergent condition) X- 1 10 Simple Discharge Coordination [name], [name] ([id number]) []  - 0 Complex (extensive) Discharge Coordination PROCESS - Special Needs []  - Pediatric / Minor Patient Management 0 []  - 0 Isolation Patient Management []  - 0 Hearing / Language / Visual special needs []  - 0 Assessment of Community assistance (transportation, D/C planning, etc.) []  - 0 Additional assistance / Altered  mentation []  - 0 Support Surface(s) Assessment (bed, cushion, seat, etc.) INTERVENTIONS - Wound Cleansing / Measurement X - Simple Wound Cleansing - one wound 1 5 []  - 0 Complex  Wound Cleansing - multiple wounds X- 1 5 Wound Imaging (photographs - any number of wounds) []  - 0 Wound Tracing (instead of photographs) X- 1 5 Simple Wound Measurement - one wound []  - 0 Complex Wound Measurement - multiple wounds INTERVENTIONS - Wound Dressings []  - Small Wound Dressing one or multiple wounds 0 X- 1 15 Medium Wound Dressing one or multiple wounds []  - 0 Large Wound Dressing one or multiple wounds []  - 0 Application of Medications - topical []  - 0 Application of Medications - injection INTERVENTIONS - Miscellaneous []  - External ear exam 0 []  - 0 Specimen Collection (cultures, biopsies, blood, body fluids, etc.) []  - 0 Specimen(s) / Culture(s) sent or taken to Lab for analysis []  - 0 Patient Transfer (multiple staff / Nurse, adult / Similar devices) []  - 0 Simple Staple / Suture removal (25 or less) []  - 0 Complex Staple / Suture removal (26 or more) []  - 0 Hypo / Hyperglycemic Management (close monitor of Blood Glucose) []  - 0 Ankle / Brachial Index (ABI) - do not check if billed separately X- 1 5 Vital Signs [name], [name] ([id number]) Has the patient been seen at the hospital within the last three years: Yes Total Score: 70 Level Of Care: New/Established - Level 2 Electronic Signature(s) Signed: 02/08/2019 4:47:28 PM By: Elliot Gurney, BSN, RN, CWS, Kim RN, BSN Entered By: Elliot Gurney, BSN, RN, CWS, Kim on 02/08/2019 13:21:17 [name] ([id number]) -------------------------------------------------------------------------------- Lower Extremity Assessment Details Patient Name: [name], [name] Date of Service: 02/08/2019 12:45 PM Medical Record Number: [id number] Patient Account Number: [id number] Date of Birth/Sex: [birth year] (62 y.o. M) Treating RN: [name] Primary  Care Lanayah Gartley: [name] Other Clinician: Referring Toryn Mcclinton: [name] Treating Najah Liverman/Extender: Maxwell Caul Weeks in Treatment: 36 Electronic Signature(s) Signed: 02/08/2019 2:47:29 PM By: [name] Entered By: [name] on 02/08/2019 13:13:02 Day, Alven W. ([id number]) -------------------------------------------------------------------------------- Multi Wound Chart Details Patient Name: [name] Date of Service: 02/08/2019 12:45 PM Medical Record Number: [id number] Patient Account Number: [id number] Date of Birth/Sex: Feb 11, 1956 (62 y.o. M) Treating RN: [name] Primary Care Armando Bukhari: [name] Other Clinician: Referring Rayvn Rickerson: [name] Treating Raea Magallon/Extender: Ryan [place] in Treatment: 36 Vital Signs Height(in): 73 Pulse(bpm): 93 Weight(lbs): 206 Blood Pressure(mmHg): 111/64 Body Mass Index(BMI): 27 Temperature(F): 98.2 Respiratory Rate 16 (breaths/min): Photos: [N/A:N/A] Wound Location: Right Gluteus N/A N/A Wounding Event: Gradually Appeared N/A N/A Primary Etiology: Pressure Ulcer N/A N/A Comorbid History: Cataracts, Congestive Heart N/A N/A Failure, Coronary Artery Disease, Hypertension, Myocardial Infarction, History of pressure wounds, Rheumatoid Arthritis Date Acquired: 05/09/2018 N/A N/A Weeks of Treatment: 36 N/A N/A Wound Status: Open N/A N/A Measurements L x W x D 1x0.5x0.1 N/A N/A (cm) Area (cm) : 0.393 N/A N/A Volume (cm) : 0.039 N/A N/A % Reduction in Area: 99.70% N/A N/A % Reduction in Volume: 100.00% N/A N/A Classification: Category/Stage IV N/A N/A Exudate Amount: Small N/A N/A Exudate Type: Serous N/A N/A Exudate Color: amber N/A N/A Wound Margin: Distinct, outline attached N/A N/A Granulation Amount: Large (67-100%) N/A N/A Granulation Quality: Pink N/A N/A Necrotic Amount: Small (1-33%) N/A N/A Exposed Structures: Fascia: Yes N/A N/A Fat Layer (Subcutaneous Tissue)  Exposed: Yes [name], [name] ([id number]) Muscle: Yes Bone: Yes Epithelialization: None N/A N/A Periwound Skin Texture: Scarring: Yes N/A N/A Excoriation: No Induration: No Callus: No Crepitus: No Rash: No Periwound Skin Moisture: Maceration: No N/A N/A Dry/Scaly: No Periwound Skin Color: Atrophie Blanche: No N/A N/A Cyanosis: No Ecchymosis: No Erythema:  No Hemosiderin Staining: No Mottled: No Pallor: No Rubor: No Temperature: No Abnormality N/A N/A Tenderness on Palpation: Yes N/A N/A Treatment Notes Electronic Signature(s) Signed: 02/08/2019 4:47:28 PM By: Elliot Gurney, BSN, RN, CWS, Kim RN, BSN Entered By: Elliot Gurney, BSN, RN, CWS, Kim on 02/08/2019 13:19:54 [name], [name] ([id number]) -------------------------------------------------------------------------------- Multi-Disciplinary Care Plan Details Patient Name: [name], SCHNICK. Date of Service: 02/08/2019 12:45 PM Medical Record Number: [id number] Patient Account Number: [id number] Date of Birth/Sex: 12/14/1955 (62 y.o. M) Treating RN: [name] Primary Care Derya Dettmann: [name] Other Clinician: Referring Waylynn Benefiel: [name] Treating Lanier Felty/Extender: Ryan [place] in Treatment: 36 Active Inactive Necrotic Tissue Nursing Diagnoses: Knowledge deficit related to management of necrotic/devitalized tissue Goals: Necrotic/devitalized tissue will be minimized in the wound bed Date Initiated: 06/15/2018 Target Resolution Date: 06/29/2018 Goal Status: Active Interventions: Assess patient pain level pre-, during and post procedure and prior to discharge Treatment Activities: Apply topical anesthetic as ordered : 06/15/2018 Excisional debridement : 06/15/2018 Notes: Orientation to the Wound Care Program Nursing Diagnoses: Knowledge deficit related to the wound healing center program Goals: Patient/caregiver will verbalize understanding of the Wound Healing Center Program Date Initiated: 06/15/2018 Target  Resolution Date: 07/01/2018 Goal Status: Active Interventions: Provide education on orientation to the wound center Notes: Pressure Nursing Diagnoses: Knowledge deficit related to management of pressures ulcers Goals: Patient will remain free from development of additional pressure ulcers Date Initiated: 06/15/2018 Target Resolution Date: 07/01/2018 JACOBO, MONCRIEF ([id number]) Goal Status: Active Interventions: Assess: immobility, friction, shearing, incontinence upon admission and as needed Notes: Wound/Skin Impairment Nursing Diagnoses: Impaired tissue integrity Goals: Ulcer/skin breakdown will have a volume reduction of 80% by week 12 Date Initiated: 06/15/2018 Target Resolution Date: 09/15/2018 Goal Status: Active Interventions: Assess ulceration(s) every visit Treatment Activities: Skin care regimen initiated : 06/15/2018 Notes: Electronic Signature(s) Signed: 02/08/2019 4:47:28 PM By: Elliot Gurney, BSN, RN, CWS, Kim RN, BSN Entered By: Elliot Gurney, BSN, RN, CWS, Kim on 02/08/2019 13:19:44 [name], [name] ([id number]) -------------------------------------------------------------------------------- Pain Assessment Details Patient Name: [name], [name] Date of Service: 02/08/2019 12:45 PM Medical Record Number: [id number] Patient Account Number: [id number] Date of Birth/Sex: 10/02/1956 (62 y.o. M) Treating RN: [name] Primary Care Braylin Formby: [name] Other Clinician: Referring Kourtlynn Trevor: [name] Treating Dion Sibal/Extender: Ryan [place] in Treatment: 36 Active Problems Location of Pain Severity and Description of Pain Patient Has Paino No Site Locations Pain Management and Medication Current Pain Management: Electronic Signature(s) Signed: 02/08/2019 2:47:29 PM By: [name] Entered By: [name] on 02/08/2019 13:09:04 Saab, [name]  ([id number]) -------------------------------------------------------------------------------- Patient/Caregiver Education Details Patient Name: [name] Date of Service: 02/08/2019 12:45 PM Medical Record Number: [id number] Patient Account Number: [id number] Date of Birth/Gender: 09/02/1956 (62 y.o. M) Treating RN: [name] Primary Care Physician: [name] Other Clinician: Referring Physician: Sindy [name] Treating Physician/Extender: Ryan [place] in Treatment: 34 Education Assessment Education Provided To: Patient Education Topics Provided Pressure: Handouts: Preventing Pressure Ulcers Methods: Demonstration, Explain/Verbal Responses: State content correctly Wound/Skin Impairment: Handouts: Caring for Your Ulcer, Other: continue wound care as prescribed Methods: Demonstration, Explain/Verbal Responses: State content correctly Electronic Signature(s) Signed: 02/08/2019 4:47:28 PM By: Elliot Gurney, BSN, RN, CWS, Kim RN, BSN Entered By: Elliot Gurney, BSN, RN, CWS, Kim on 02/08/2019 13:22:16 Lantzy, [name] ([id number]) -------------------------------------------------------------------------------- Wound Assessment Details Patient Name: [name] Date of Service: 02/08/2019 12:45 PM Medical Record Number: [id number] Patient Account Number: [id number] Date of Birth/Sex: December 03, 1955 (62 y.o. M) Treating RN: [name] Primary Care Kimberla Driskill: [name] Other Clinician: Referring  Elmo Shumard: Ryan MessingGOMEZ, Ryan Treating Jahnaya Branscome/Extender: Ryan CarolinaOBSON, Ryan G Weeks in Treatment: 36 Wound Status Wound Number: 1 Primary Pressure Ulcer Etiology: Wound Location: Right Gluteus Wound Open Wounding Event: Gradually Appeared Status: Date Acquired: 05/09/2018 Comorbid Cataracts, Congestive Heart Failure, Coronary Weeks Of Treatment: 36 History: Artery Disease, Hypertension, Myocardial Clustered Wound: No Infarction, History of pressure wounds, Rheumatoid  Arthritis Photos Wound Measurements Length: (cm) 1 Width: (cm) 0.5 Depth: (cm) 0.1 Area: (cm) 0.393 Volume: (cm) 0.039 % Reduction in Area: 99.7% % Reduction in Volume: 100% Epithelialization: None Tunneling: No Undermining: No Wound Description Classification: Category/Stage IV Foul Odor Wound Margin: Distinct, outline attached Slough/Fib Exudate Amount: Small Exudate Type: Serous Exudate Color: amber After Cleansing: No rino Yes Wound Bed Granulation Amount: Large (67-100%) Exposed Structure Granulation Quality: Pink Fascia Exposed: Yes Necrotic Amount: Small (1-33%) Fat Layer (Subcutaneous Tissue) Exposed: Yes Necrotic Quality: Adherent Slough Muscle Exposed: Yes Necrosis of Muscle: No Bone Exposed: Yes Periwound Skin Texture Texture Color Guastella, Arlyn W. ([id number]) No Abnormalities Noted: No No Abnormalities Noted: No Callus: No Atrophie Blanche: No Crepitus: No Cyanosis: No Excoriation: No Ecchymosis: No Induration: No Erythema: No Rash: No Hemosiderin Staining: No Scarring: Yes Mottled: No Pallor: No Moisture Rubor: No No Abnormalities Noted: No Dry / Scaly: No Temperature / Pain Maceration: No Temperature: No Abnormality Tenderness on Palpation: Yes Electronic Signature(s) Signed: 02/08/2019 2:47:29 PM By: Ryan Sitesorthy, Joanna Entered By: Ryan Sitesorthy, Joanna on 02/08/2019 13:12:51 Rodocker, Nicklas W. ([id number]) -------------------------------------------------------------------------------- Vitals Details Patient Name: Ryan CarrowMOORE, Arieh W. Date of Service: 02/08/2019 12:45 PM Medical Record Number: [id number] Patient Account Number: [id number] Date of Birth/Sex: Aug 29, 1956 (62 y.o. M) Treating RN: Ryan Sitesorthy, Joanna Primary Care Alondria Mousseau: Ryan MessingGOMEZ, Ryan Other Clinician: Referring Murdis Flitton: Ryan MessingGOMEZ, Ryan Treating Nadalie Laughner/Extender: Ryan CarolinaOBSON, Ryan G Weeks in Treatment: 36 Vital Signs Time Taken: 13:02 Temperature (F): 98.2 Height (in): 73 Pulse (bpm):  93 Weight (lbs): 206 Respiratory Rate (breaths/min): 16 Body Mass Index (BMI): 27.2 Blood Pressure (mmHg): 111/64 Reference Range: 80 - 120 mg / dl Electronic Signature(s) Signed: 02/08/2019 2:47:29 PM By: Ryan Sitesorthy, Joanna Entered By: Ryan Sitesorthy, Joanna on 02/08/2019 13:09:26

## 2019-03-01 ENCOUNTER — Encounter: Payer: Medicaid Other | Admitting: Internal Medicine

## 2019-03-01 ENCOUNTER — Other Ambulatory Visit: Payer: Self-pay

## 2019-03-01 DIAGNOSIS — L89314 Pressure ulcer of right buttock, stage 4: Secondary | ICD-10-CM | POA: Diagnosis not present

## 2019-03-02 NOTE — Progress Notes (Signed)
KERVIN, BONES ([id number]) Visit Report for 03/01/2019 HPI Details Patient Name: [name], [name] Date of Service: 03/01/2019 10:00 AM Medical Record Number: [id number] Patient Account Number: [id number] Date of Birth/Sex: 09/19/1956 (63 y.o. M) Treating RN: Arnette Norris Primary Care Provider: Sindy Messing Other Clinician: Referring Provider: Sindy Messing Treating Provider/Extender: Altamese [place] in Treatment: 74 History of Present Illness HPI Description: ADMISSION 06/01/18 This is a 64 year old man who is sent to [place] currently residing at [facility] skilled facility. He has a complicated recent medical history. His sister accompanies him tells [place] that he has been somewhat disabled since suffering a left basal ganglial CVA in 2018. At that point in time he also had myocardial infarctions. More recently he has been hospitalized from 04/04/18 through 04/12/18 with septic shock, coronary artery disease, congestive heart failure and an acute CVA. During this hospitalization he was noted to have sacral wounds but these were not felt to be the source of infection however the source of the infection was never really determined at that point. I believe he was sent to a skilled facility. He reports that at the hospital on 05/09/18 through 05/18/18. At that point he was noted to have a large abscess of the right buttock. He required a surgical IandD by Dr. Gerrit Friends of general surgery. Culture of this grew Citrobacter,Eikenella and Bacteroides. He had a CT scan of the pelvis that showed an abnormal soft tissue collection just caudal to the right ischial tuberosity measuring 5.9 and by 4.7. This also involved the distal right deep muscles. There was some cortical bone loss on the right ischial tuberosity and there was some concern with osteomyelitis. He was felt to require treatment for osteomyelitis based on clinical and radiographic findings. He did not have an MRI. Past medical history  includes; abdominal aortic aneurysm, carotid stenosis, hypertension, osteoarthritis, interstitial lung disease on chronic steroids, rheumatoid arthritis, congestive heart failure/ischemic cardiomyopathy with an ejection fraction of 35-40% Also noteworthy is on 05/09/18 his albumin was 2.2 on admission to hospital. I don't think this is actually been repeated. The patient states his appetite is good and he is taking supplements 100%. 06/15/18-He is seen in follow up vibration for right buttock ulcer; this is stable in appearance with minimal amount of non-viable tissue. 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 We've been using a wound VAC. This is centered over the right ischial tuberosity. He is making generally good progress. He was severely hypoalbuminemic at one time although he states that he eats well and takes his supplements. 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 We have been using silver collagen over the superior aspect of the right ischial tuberosity under wound VAC.he has made some nice improvements. The dimensions of the wound are certainly smaller. He has no exposed bone. We received lab work from [facility]. His prealbumin was 18.6 and albumin at 2.57. This is obviously still low. I am not sure what interventions are in place. I know he is on supplements. I discussed this with the patient. I told him that the albumin represents already moderate to severe protein malnutrition. This would contribute it to do difficulty healing the wound 07/20/18; I received copies of lab work from [facility] on 07/06/18 documenting a sedimentation rate of 25 which seems to be in line with what I see  from previous values however his C-reactive protein was 110.8 versus 84.7 on 06/22/18. I have looked through [facility] health  Link I don't see a prior value for either inflammatory measure. he has been treated with [name] which I think he is still on for osteomyelitis of the right ischial tuberosity as well as a soft tissue collection just caudad to the right ischial tuberosity itself. Felt to have a soft tissue abscess and/or stigmata of necrotizing fasciitis. I'm not sure who is following this. His CT scan was on 05/09/18 yet he still remains on Invanz. Looking back through the original discharge summary the [name] was supposed to finish on 07/05/18. 08/03/18 the patient continues to make good improvement. The area over the right ischial tuberosity has filled in nicely. His developed some what looks to be candidal skin infection in the periwound. I think they've taken the wound VAC off and are HAKOP, KIRT. ([id number]) applying nystatin 08/17/18; 2 week follow-up for this resident from [facility]. They resumed the wound VAC. The area continues to fill in nicely. The only problem area is the tissue over the ischial tuberosity itself which needs to come up a bit. For this reason I'm continuing the wound VAC. Otherwise this is doing remarkably well. The patient states that he is eating everything they give him although I don't see a follow-up albumin. 08/31/18; the patient states he had blood work done today. I do not have these results. He claims to be eating well. He continues with a wound VAC and the wound is making nice progress. 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 This is coming quite nicely he still has one Onalee Hua that has some depth but I think by the next time we see him in 2 weeks we should be able to move on from the wound VAC. He is still at skilled care at [facility]. As far as I am aware he is eating and drinking well. I  have not seen follow-up albumin studies on him recently but the message I'm getting is that he is eating well and taking his supplements. The patient states he is not very mobile and I am hopeful that we are able to insure pressure relief here however if we hadn't been ensuring pressure relief I doubt this would've closed in as well as it has even with the wound VAC. He does not complain of any systemic issues 09/28/2018 for follow-up and management of stage IV sacral ulcer that is being managed with a wound VAC. He is a resident at a long-term care facility, [facility]. The wound continues to have some depth and width some granulation of tissue on the outer borders of the wound. The wound was tender to touch today during exam. He denies any pain to the wound when it is not being touched. Recent fevers, chills, nausea, vomiting, or shortness of breath. Hopeful over the next couple of weeks he will be able to transition from the wound VAC. 10/19/2018; this patient has a stage IV over the right ischial tuberosity. The wound is come to the surface of the skin and looks very healthy at this point. His wound VAC can be discontinued. The patient apparently is eating well taking his supplements. He has no systemic complaints 11/23/2018. The patient has a stage IV wound over  his right ischial tuberosity that is now down to a quarter sized superficial area. There is no depth to this. The tissue looks healthy. No debridement was required. Continued improvement. We are using silver alginate and bordered foam 2/19; the original stage IV wound over his right ischial tuberosity. Continues to make nice progress in terms of dimensions. There is no depth to this. We have been using silver alginate and border foam 3/11; this is in follow-up of his original stage IV wound over his right ischial tuberosity. This continues to make nice progress using silver alginate and border foam vigorous offloading. He is at [facility] skilled facility. Patient complains of terrible pain in his hands and having trouble getting pain medication which he has to ask for as needed. He apparently has a history of rheumatoid arthritis and I note he is on prednisone and chloroquine. [facility]. 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 This continues to make decent progress is smaller. There is some undermining at roughly 12-3 o'clock. We have been using silver alginate 4/29; patient's wound continues to get smaller. The remaining area is epithelializing. The undermining area still is not epithelialized however. We have been using silver collagen since the last time he is here. He does not come any notes from the facility where he lives [place] but he claims to be eating well and vigorously offloading this area Electronic Signature(s) Signed: 03/02/2019 7:17:18 AM By: Baltazar Najjarobson, Michael MD Entered By: Baltazar Najjarobson, Michael on 03/01/2019 10:18:07 [name], Sladen W. ([id number]) -------------------------------------------------------------------------------- Physical Exam Details Patient Name: [name], Che W. Date of Service: 03/01/2019 10:00 AM Medical Record Number: [id number] Patient Account Number: [id number] Date of Birth/Sex: December 26, 1955 (63 y.o. M) Treating RN: Arnette NorrisBiell, Kristina Primary Care Provider: Sindy MessingGOMEZ, ROGER Other Clinician: Referring Provider: Sindy MessingGOMEZ, ROGER Treating Provider/Extender: Altamese CarolinaOBSON, MICHAEL G Weeks in Treatment: 39 Constitutional Sitting or standing Blood Pressure is within target range for patient.. Pulse regular and within target range for patient.[name]. Respirations regular, non-labored and within target range.. Temperature is normal and within the target range for the patient.. Eyes Conjunctivae clear. No discharge. Respiratory Respiratory effort is easy and symmetric bilaterally.  Rate is normal at rest and on room air.. Gastrointestinal (GI) Abdomen is soft and non-distended without masses or tenderness. Bowel sounds active in all quadrants.. No liver or spleen enlargement or tenderness.. Integumentary (Hair, Skin) . Psychiatric No evidence of depression, anxiety, or agitation. Calm, cooperative, and communicative. Appropriate interactions and affect.. Notes Wound exam; the areas over the right ischial tuberosity. It is contracted quite a bit. Wound is superficial with advancing epithelialization. The remaining open area is the undermining area that we identified last time. No primary cutaneous issues are seen. There is no surrounding erythema. There is not a lot of tissue over bone in this area and this will have to be monitored going forward Electronic Signature(s) Signed: 03/02/2019 7:17:18 AM By: Baltazar Najjarobson, Michael MD Entered By: Baltazar Najjarobson, Michael on 03/01/2019 10:20:36 Nickerson, Loreli SlotPHILLIP W. ([id number]) -------------------------------------------------------------------------------- Physician Orders Details Patient Name: [name], Moua W. Date of Service: 03/01/2019 10:00 AM Medical Record Number: [id number] Patient Account Number: [id number] Date of Birth/Sex: December 26, 1955 (63 y.o. M) Treating RN: Arnette NorrisBiell, Kristina Primary Care Provider: Sindy MessingGOMEZ, ROGER Other Clinician: Referring Provider: Sindy MessingGOMEZ, ROGER Treating Provider/Extender: Altamese CarolinaOBSON, MICHAEL G Weeks in Treatment: 3339 Verbal / Phone Orders: No Diagnosis Coding Wound Cleansing Wound #1 Right Gluteus o Clean  wound with Normal Saline. Anesthetic (add to Medication List) Wound #1 Right Gluteus o Topical Lidocaine 4% cream applied to wound bed prior to debridement (In Clinic Only). Primary Wound Dressing Wound #1 Right Gluteus o Silver Collagen - moistened with hydrogel/ky jelly Secondary Dressing Wound #1 Right Gluteus o Boardered Foam Dressing Dressing Change Frequency Wound #1 Right Gluteus o Change  dressing every other day. Follow-up Appointments Wound #1 Right Gluteus o Return Appointment in 3 weeks. Off-Loading Wound #1 Right Gluteus o Turn and reposition every 2 hours Electronic Signature(s) Signed: 03/01/2019 4:45:14 PM By: Arnette Norris Signed: 03/02/2019 7:17:18 AM By: Baltazar Najjar MD Entered By: Arnette Norris on 03/01/2019 10:10:58 [name] ([id number]) -------------------------------------------------------------------------------- Problem List Details Patient Name: DEVAM, CERAVOLO. Date of Service: 03/01/2019 10:00 AM Medical Record Number: [id number] Patient Account Number: [id number] Date of Birth/Sex: 1956/01/03 (63 y.o. M) Treating RN: Arnette Norris Primary Care Provider: Sindy Messing Other Clinician: Referring Provider: Sindy Messing Treating Provider/Extender: Altamese [place] in Treatment: 39 Active Problems ICD-10 Evaluated Encounter Code Description Active Date Today Diagnosis L89.314 Pressure ulcer of right buttock, stage 4 06/01/2018 No Yes T81.31XD Disruption of external operation (surgical) wound, not 06/01/2018 No Yes elsewhere classified, subsequent encounter E43 Unspecified severe protein-calorie malnutrition 06/01/2018 No Yes Inactive Problems ICD-10 Code Description Active Date Inactive Date M86.18 Other acute osteomyelitis, other site 06/01/2018 06/01/2018 Resolved Problems Electronic Signature(s) Signed: 03/02/2019 7:17:18 AM By: Baltazar Najjar MD Entered By: Baltazar Najjar on 03/01/2019 10:16:45 Car[place]achea, [name] ([id number]) -------------------------------------------------------------------------------- Progress Note Details Patient Name: [name]. Date of Service: 03/01/2019 10:00 AM Medical Record Number: [id number] Patient Account Number: [id number] Date of Birth/Sex: 1956-05-14 (63 y.o. M) Treating RN: Arnette Norris Primary Care Provider: Sindy Messing Other Clinician: Referring Provider: Sindy Messing Treating Provider/Extender: Altamese [place] in Treatment: 39 Subjective History of Present Illness (HPI) ADMISSION 06/01/18 This is a 63 year old man who is sent to [place] currently residing at [facility] skilled facility. He has a complicated recent medical history. His sister accompanies him tells [place] that he has been somewhat disabled since suffering a left basal ganglial CVA in 2018. At that point in time he also had myocardial infarctions. More recently he has been hospitalized from 04/04/18 through 04/12/18 with septic shock, coronary artery disease, congestive heart failure and an acute CVA. During this hospitalization he was noted to have sacral wounds but these were not felt to be the source of infection however the source of the infection was never really determined at that point. I believe he was sent to a skilled facility. He reports that at the hospital on 05/09/18 through 05/18/18. At that point he was noted to have a large abscess of the right buttock. He required a surgical IandD by Dr. Gerrit Friends of general surgery. Culture of this grew Citrobacter,Eikenella and Bacteroides. He had a CT scan of the pelvis that showed an abnormal soft tissue collection just caudal to the right ischial tuberosity measuring 5.9 and by 4.7. This also involved the distal right deep muscles. There was some cortical bone loss on the right ischial tuberosity and there was some concern with osteomyelitis. He was felt to require treatment for osteomyelitis based on clinical and radiographic findings. He did not have an MRI. Past medical history includes; abdominal aortic aneurysm, carotid stenosis, hypertension, osteoarthritis, interstitial lung disease on chronic steroids, rheumatoid arthritis, congestive heart failure/ischemic cardiomyopathy with an ejection fraction of 35-40% Also noteworthy is on 05/09/18 his albumin was 2.2 on admission to hospital.  I don't think this is actually been repeated.  The patient states his appetite is good and he is taking supplements 100%. 06/15/18-He is seen in follow up vibration for right buttock ulcer; this is stable in appearance with minimal amount of non-viable tissue. 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 We've been using a wound VAC. This is centered over the right ischial tuberosity. He is making generally good progress. He was severely hypoalbuminemic at one time although he states that he eats well and takes his supplements. 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 We have been using silver collagen over the superior aspect of the right ischial tuberosity under wound VAC.he has made some nice improvements. The dimensions of the wound are certainly smaller. He has no exposed bone. We received lab work from [facility]. His prealbumin was 18.6 and albumin at 2.57. This is obviously still low. I am not sure what interventions are in place. I know he is on supplements. I discussed this with the patient. I told him that the albumin represents already moderate to severe protein malnutrition. This would contribute it to do difficulty healing the wound 07/20/18; I received copies of lab work from [facility] on 07/06/18 documenting a sedimentation rate of 25 which seems to be in line with what I see from previous values however his C-reactive protein was 110.8 versus 84.7 on 06/22/18. I have looked through [facility] health Link I don't see a prior value for either inflammatory measure. he has been treated with [name] which I think he is still on for osteomyelitis of the right ischial tuberosity as well as a soft tissue collection just caudad to the right ischial tuberosity itself. Felt to have a soft tissue abscess and/or stigmata of necrotizing fasciitis. I'm not sure who  is following this. His CT scan was on 05/09/18 yet he still remains on Invanz. Looking back through the original discharge summary the [name] was supposed to finish on 07/05/18. 08/03/18 the patient continues to make good improvement. The area over the right ischial tuberosity has filled in nicely. His developed some what looks to be candidal skin infection in the periwound. I think they've taken the wound VAC off and are applying nystatin 08/17/18; 2 week follow-up for this resident from [facility]. They resumed the wound VAC. The area continues to fill in nicely. The only problem area is the tissue over the ischial tuberosity itself which needs to come up a bit. For this reason Oseph Imburgia, Tymeer W. ([id number]) continuing the wound VAC. Otherwise this is doing remarkably well. The patient states that he is eating everything they give him although I don't see a follow-up albumin. 08/31/18; the patient states he had blood work done today. I do not have these results. He claims to be eating well. He continues with a wound VAC and the wound is making nice progress. 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 This is coming quite nicely he still has one Onalee Hua that has some depth but I think by the next time we see him in 2 weeks we should be able to move on from the wound VAC. He is  still at [facility]. As far as I am aware he is eating and drinking well. I have not seen follow-up albumin studies on him recently but the message I'm getting is that he is eating well and taking his supplements. The patient states he is not very mobile and I am hopeful that we are able to insure pressure relief here however if we hadn't been ensuring pressure relief I doubt this would've closed in as well as it has even with  the wound VAC. He does not complain of any systemic issues 09/28/2018 for follow-up and management of stage IV sacral ulcer that is being managed with a wound VAC. He is a resident at a long-term care facility, [facility]. The wound continues to have some depth and width some granulation of tissue on the outer borders of the wound. The wound was tender to touch today during exam. He denies any pain to the wound when it is not being touched. Recent fevers, chills, nausea, vomiting, or shortness of breath. Hopeful over the next couple of weeks he will be able to transition from the wound VAC. 10/19/2018; this patient has a stage IV over the right ischial tuberosity. The wound is come to the surface of the skin and looks very healthy at this point. His wound VAC can be discontinued. The patient apparently is eating well taking his supplements. He has no systemic complaints 11/23/2018. The patient has a stage IV wound over his right ischial tuberosity that is now down to a quarter sized superficial area. There is no depth to this. The tissue looks healthy. No debridement was required. Continued improvement. We are using silver alginate and bordered foam 2/19; the original stage IV wound over his right ischial tuberosity. Continues to make nice progress in terms of dimensions. There is no depth to this. We have been using silver alginate and border foam 3/11; this is in follow-up of his original stage IV wound over his right ischial tuberosity. This continues to make nice progress using silver alginate and border foam vigorous offloading. He is at [facility]. Patient complains of terrible pain in his hands and having trouble getting pain medication which he has to ask for as needed. He apparently has a history of rheumatoid arthritis and I note he is on prednisone and chloroquine. [facility]. 4/8; it is been about a month since we have seen this man  with regards to his original stage IV wound over his right ischial tuberosity. This continues to make decent progress is smaller. There is some undermining at roughly 12-3 o'clock. We have been using silver alginate 4/29; patient's wound continues to get smaller. The remaining area is epithelializing. The undermining area still is not epithelialized however. We have been using silver collagen since the last time he is here. He does not come any notes from the facility where he lives [place] but he claims to be eating well and vigorously offloading this area Objective Constitutional Sitting or standing Blood Pressure is within target range for patient.. Pulse regular and within target range for patient.[name]. Respirations regular, non-labored and within target range.. Temperature is normal and within the target range for the patient.. Vitals Time Taken: 9:48 AM, Height: 73 in, Weight: 206 lbs, BMI: 27.2, Temperature: 98.0 F, Pulse: 91 bpm, Respiratory Rate: 18 breaths/min, Blood Pressure: 125/77 mmHg. Eyes Conjunctivae clear. No discharge. [name], Bodin W. ([id number]) Respiratory Respiratory effort is easy and symmetric bilaterally. Rate  is normal at rest and on room air.. Gastrointestinal (GI) Abdomen is soft and non-distended without masses or tenderness. Bowel sounds active in all quadrants.. No liver or spleen enlargement or tenderness.[name] Psychiatric No evidence of depression, anxiety, or agitation. Calm, cooperative, and communicative. Appropriate interactions and affect.. General Notes: Wound exam; the areas over the right ischial tuberosity. It is contracted quite a bit. Wound is superficial with advancing epithelialization. The remaining open area is the undermining area that we identified last time. No primary cutaneous issues are seen. There is no surrounding erythema. There is not a lot of tissue over bone in this area and this will have to be monitored going  forward Integumentary (Hair, Skin) Wound #1 status is Open. Original cause of wound was Gradually Appeared. The wound is located on the Right Gluteus. The wound measures 0.3cm length x 1cm width x 0.1cm depth; 0.236cm^2 area and 0.024cm^3 volume. There is bone, muscle, Fat Layer (Subcutaneous Tissue) Exposed, and fascia exposed. There is no tunneling or undermining noted. There is a small amount of serous drainage noted. The wound margin is distinct with the outline attached to the wound base. There is large (67-100%) pink granulation within the wound bed. There is no necrotic tissue within the wound bed. The periwound skin appearance exhibited: Scarring, Maceration. The periwound skin appearance did not exhibit: Callus, Crepitus, Excoriation, Induration, Rash, Dry/Scaly, Atrophie Blanche, Cyanosis, Ecchymosis, Hemosiderin Staining, Mottled, Pallor, Rubor, Erythema. Periwound temperature was noted as No Abnormality. Assessment Active Problems ICD-10 Pressure ulcer of right buttock, stage 4 Disruption of external operation (surgical) wound, not elsewhere classified, subsequent encounter Unspecified severe protein-calorie malnutrition Plan Wound Cleansing: Wound #1 Right Gluteus: Clean wound with Normal Saline. Anesthetic (add to Medication List): Wound #1 Right Gluteus: Topical Lidocaine 4% cream applied to wound bed prior to debridement (In Clinic Only). Primary Wound Dressing: Wound #1 Right Gluteus: Silver Collagen - moistened with hydrogel/ky jelly Secondary Dressing: ESPIRIDION, SUPINSKI. ([id number]) Wound #1 Right Gluteus: Boardered Foam Dressing Dressing Change Frequency: Wound #1 Right Gluteus: Change dressing every other day. Follow-up Appointments: Wound #1 Right Gluteus: Return Appointment in 3 weeks. Off-Loading: Wound #1 Right Gluteus: Turn and reposition every 2 hours 1. Continue with silver collagen 2. Follow-up in 1 month. The patient continues to make good  progress. 3. He looks frail but otherwise stable. 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 Date of Service: 03/01/2019 Medical Record Number: [id number] Patient Account Number: [id number] Date of Birth/Sex: [birth year] (63 y.o. M) Treating RN: Arnette Norris Primary Care Provider: Sindy Messing Other Clinician: Referring Provider: Sindy Messing Treating Provider/Extender: Altamese Weir in Treatment: 39 Diagnosis Coding ICD-10 Codes Code Description L89.314 Pressure ulcer of right buttock, stage 4 T81.31XD Disruption of external operation (surgical) wound, not elsewhere classified, subsequent encounter E43 Unspecified severe protein-calorie malnutrition Facility Procedures CPT4 Code: 40102725 Description: 99213 - WOUND CARE VISIT-LEV 3 EST PT Modifier: Quantity: 1 Physician Procedures CPT4: Description Modifier Quantity Code [id number] 99213 - WC PHYS LEVEL 3 - EST PT 1 ICD-10 Diagnosis Description L89.314 Pressure ulcer of right buttock, stage 4 T81.31XD Disruption of external operation (  surgical) wound, not elsewhere classified,  subsequent encounter Electronic Signature(s) Signed: 03/02/2019 7:17:18 AM By: Baltazar Najjar MD Entered By: Baltazar Najjar on 03/01/2019 10:21:59

## 2019-03-02 NOTE — Progress Notes (Signed)
[name], [name] ([id number]) Visit Report for 03/01/2019 Arrival Information Details Patient Name: [name], [name] Date of Service: 03/01/2019 10:00 AM Medical Record Number: [id number] Patient Account Number: [id number] Date of Birth/Sex: [birth year] (62 y.o. M) Treating RN: [name] Primary Care Noris Kulinski: Sindy Messing Other Clinician: Referring [name]: Sindy Messing Treating [name]: [name]: 39 Visit Information History Since Last Visit Added or deleted any medications: No Patient Arrived: Wheel Chair Any new allergies or adverse reactions: No Arrival Time: 09:49 Had a fall or experienced change in No activities of daily living that may affect Accompanied By: caregiver risk of [name]: Transfer Assistance: None Signs or symptoms of abuse/neglect since last visito No Patient Identification Verified: Yes Hospitalized since last visit: No Secondary Verification Process Completed: Yes Implantable device outside of the clinic excluding No Patient Requires Transmission-Based No cellular tissue based products placed in the center Precautions: since last visit: Patient Has Alerts: No Has Dressing in Place as Prescribed: Yes Pain Present Now: No Electronic Signature(s) Signed: 03/01/2019 2:30:22 PM By: [name] Entered By: [name] on 03/01/2019 09:50:03 [name], [name] ([id number]) -------------------------------------------------------------------------------- Clinic Level of Care Assessment Details Patient Name: [name] Date of Service: 03/01/2019 10:00 AM Medical Record Number: [id number] Patient Account Number: [id number] Date of Birth/Sex: [birth year] (62 y.o. M) Treating RN: [name] Primary Care [name]: Sindy Messing Other Clinician: Referring [name]: Sindy Messing Treating [name]: [name] in [name]: 39 Clinic Level of Care  Assessment Items TOOL 4 Quantity Score []  - Use when only an EandM is performed on FOLLOW-UP visit 0 ASSESSMENTS - Nursing Assessment / Reassessment X - Reassessment of Co-morbidities (includes updates in patient status) 1 10 X- 1 5 Reassessment of Adherence to [name] Plan ASSESSMENTS - Wound and Skin Assessment / Reassessment X - Simple Wound Assessment / Reassessment - one wound 1 5 []  - 0 Complex Wound Assessment / Reassessment - multiple wounds []  - 0 Dermatologic / Skin Assessment (not related to wound area) ASSESSMENTS - Focused Assessment []  - Circumferential Edema Measurements - multi extremities 0 []  - 0 Nutritional Assessment / Counseling / Intervention []  - 0 Lower Extremity Assessment (monofilament, tuning fork, pulses) []  - 0 Peripheral Arterial Disease Assessment (using hand held doppler) ASSESSMENTS - Ostomy and/or Continence Assessment and Care []  - Incontinence Assessment and Management 0 []  - 0 Ostomy Care Assessment and Management (repouching, etc.) PROCESS - Coordination of Care X - Simple Patient / Family Education for ongoing care 1 15 []  - 0 Complex (extensive) Patient / Family Education for ongoing care []  - 0 Staff obtains Chiropractor, Records, Test Results / Process Orders X- 1 10 Staff telephones HHA, Nursing Homes / Clarify orders / etc []  - 0 Routine Transfer to another Facility (non-emergent condition) []  - 0 Routine Hospital Admission (non-emergent condition) []  - 0 New Admissions / Manufacturing engineer / Ordering NPWT, Apligraf, etc. []  - 0 Emergency Hospital Admission (emergent condition) X- 1 10 Simple Discharge Coordination [name], [name] ([id number]) []  - 0 Complex (extensive) Discharge Coordination PROCESS - Special Needs []  - Pediatric / Minor Patient Management 0 []  - 0 Isolation Patient Management []  - 0 Hearing / Language / Visual special needs []  - 0 Assessment of Community assistance (transportation, D/C planning,  etc.) []  - 0 Additional assistance / Altered mentation []  - 0 Support Surface(s) Assessment (bed, cushion, seat, etc.) INTERVENTIONS - Wound Cleansing / Measurement X - Simple Wound Cleansing - one wound 1  5 []  - 0 Complex Wound Cleansing - multiple wounds X- 1 5 Wound Imaging (photographs - any number of wounds) []  - 0 Wound Tracing (instead of photographs) X- 1 5 Simple Wound Measurement - one wound []  - 0 Complex Wound Measurement - multiple wounds INTERVENTIONS - Wound Dressings X - Small Wound Dressing one or multiple wounds 1 10 []  - 0 Medium Wound Dressing one or multiple wounds []  - 0 Large Wound Dressing one or multiple wounds []  - 0 Application of Medications - topical []  - 0 Application of Medications - injection INTERVENTIONS - Miscellaneous []  - External ear exam 0 []  - 0 Specimen Collection (cultures, biopsies, blood, body fluids, etc.) []  - 0 Specimen(s) / Culture(s) sent or taken to Lab for analysis X- 1 10 Patient Transfer (multiple staff / Nurse, adultHoyer Lift / Similar devices) []  - 0 Simple Staple / Suture removal (25 or less) []  - 0 Complex Staple / Suture removal (26 or more) []  - 0 Hypo / Hyperglycemic Management (close monitor of Blood Glucose) []  - 0 Ankle / Brachial Index (ABI) - do not check if billed separately X- 1 5 Vital Signs [name], [name] ([id number]) Has the patient been seen at the hospital within the last three years: Yes Total Score: 95 Level Of Care: New/Established - Level 3 Electronic Signature(s) Signed: 03/01/2019 4:45:14 PM By: [name] Entered By: [name] on 03/01/2019 10:10:11 [name], [name] ([id number]) -------------------------------------------------------------------------------- Lower Extremity Assessment Details Patient Name: Ryan CarrowMOORE, Cartrell W. Date of Service: 03/01/2019 10:00 AM Medical Record Number: [id number] Patient Account Number: [id number] Date of Birth/Sex: [birth year] (62 y.o. M) Treating RN:  [name] Primary Care Jwan Hornbaker: Sindy MessingGOMEZ, [name] Other Clinician: Referring Ellwood Steidle: Sindy MessingGOMEZ, [name] Treating Daisee Centner/Extender: Maxwell CaulOBSON, MICHAEL G Weeks in [name]: 39 Electronic Signature(s) Signed: 03/01/2019 4:42:28 PM By: [name] Entered By: [name] on 03/01/2019 10:01:47 [name], [name] ([id number]) -------------------------------------------------------------------------------- Multi Wound Chart Details Patient Name: Ryan CarrowMOORE, Perrion W. Date of Service: 03/01/2019 10:00 AM Medical Record Number: [id number] Patient Account Number: [id number] Date of Birth/Sex: [birth year] (62 y.o. M) Treating RN: [name] Primary Care Tracina Beaumont: Sindy MessingGOMEZ, [name] Other Clinician: Referring Kesley Gaffey: Sindy MessingGOMEZ, [name] Treating Currie Dennin/Extender: Ryan CarolinaOBSON, MICHAEL G Weeks in [name]: 39 Vital Signs Height(in): 73 Pulse(bpm): 91 Weight(lbs): 206 Blood Pressure(mmHg): 125/77 Body Mass Index(BMI): 27 Temperature(F): 98.0 Respiratory Rate 18 (breaths/min): Photos: [N/A:N/A] Wound Location: Right Gluteus N/A N/A Wounding Event: Gradually Appeared N/A N/A Primary Etiology: Pressure Ulcer N/A N/A Comorbid History: Cataracts, Congestive Heart N/A N/A Failure, Coronary Artery Disease, Hypertension, Myocardial Infarction, History of pressure wounds, Rheumatoid Arthritis Date Acquired: 05/09/2018 N/A N/A Weeks of [name]: 39 N/A N/A Wound Status: Open N/A N/A Measurements L x W x D 0.3x1x0.1 N/A N/A (cm) Area (cm) : 0.236 N/A N/A Volume (cm) : 0.024 N/A N/A % Reduction in Area: 99.80% N/A N/A % Reduction in Volume: 100.00% N/A N/A Classification: Category/Stage IV N/A N/A Exudate Amount: Small N/A N/A Exudate Type: Serous N/A N/A Exudate Color: amber N/A N/A Wound Margin: Distinct, outline attached N/A N/A Granulation Amount: Large (67-100%) N/A N/A Granulation Quality: Pink N/A N/A Necrotic Amount: None Present (0%) N/A N/A Exposed Structures: Fascia: Yes N/A N/A Fat  Layer (Subcutaneous Tissue) Exposed: Yes Gisler, [name] ([id number]) Muscle: Yes Bone: Yes Epithelialization: Medium (34-66%) N/A N/A Periwound Skin Texture: Scarring: Yes N/A N/A Excoriation: No Induration: No Callus: No Crepitus: No Rash: No Periwound Skin Moisture: Maceration: Yes N/A N/A Dry/Scaly: No Periwound Skin Color: Atrophie Blanche: No N/A N/A Cyanosis: No Ecchymosis: No Erythema:  No Hemosiderin Staining: No Mottled: No Pallor: No Rubor: No Temperature: No Abnormality N/A N/A Tenderness on Palpation: No N/A N/A [name] Notes Electronic Signature(s) Signed: 03/02/2019 7:17:18 AM By: Baltazar Najjar MD Entered By: Baltazar Najjar on 03/01/2019 10:16:59 Friesenhahn, [name] ([id number]) -------------------------------------------------------------------------------- Multi-Disciplinary Care Plan Details Patient Name: [name], MELI. Date of Service: 03/01/2019 10:00 AM Medical Record Number: [id number] Patient Account Number: [id number] Date of Birth/Sex: Apr 09, 1956 (62 y.o. M) Treating RN: [name] Primary Care Aristotle Lieb: Sindy Messing Other Clinician: Referring Jahni Paul: Sindy Messing Treating Dannon Nguyenthi/Extender: Ryan [place] in [name]: 39 Active Inactive Necrotic Tissue Nursing Diagnoses: Knowledge deficit related to management of necrotic/devitalized tissue Goals: Necrotic/devitalized tissue will be minimized in the wound bed Date Initiated: 06/15/2018 Target Resolution Date: 06/29/2018 Goal Status: Active Interventions: Assess patient pain level pre-, during and post procedure and prior to discharge [name] Activities: Apply topical anesthetic as ordered : 06/15/2018 Excisional debridement : 06/15/2018 Notes: Orientation to the Wound Care Program Nursing Diagnoses: Knowledge deficit related to the wound healing center program Goals: Patient/caregiver will verbalize understanding of the Wound Healing Center Program Date  Initiated: 06/15/2018 Target Resolution Date: 07/01/2018 Goal Status: Active Interventions: Provide education on orientation to the wound center Notes: Pressure Nursing Diagnoses: Knowledge deficit related to management of pressures ulcers Goals: Patient will remain free from development of additional pressure ulcers Date Initiated: 06/15/2018 Target Resolution Date: 07/01/2018 [name], [name] ([id number]) Goal Status: Active Interventions: Assess: immobility, friction, shearing, incontinence upon admission and as needed Notes: Wound/Skin Impairment Nursing Diagnoses: Impaired tissue integrity Goals: Ulcer/skin breakdown will have a volume reduction of 80% by week 12 Date Initiated: 06/15/2018 Target Resolution Date: 09/15/2018 Goal Status: Active Interventions: Assess ulceration(s) every visit [name] Activities: Skin care regimen initiated : 06/15/2018 Notes: Electronic Signature(s) Signed: 03/01/2019 4:45:14 PM By: [name] Entered By: [name] on 03/01/2019 10:09:03 [name] ([id number]) -------------------------------------------------------------------------------- Pain Assessment Details Patient Name: [name] Date of Service: 03/01/2019 10:00 AM Medical Record Number: [id number] Patient Account Number: [id number] Date of Birth/Sex: December 06, 1955 (62 y.o. M) Treating RN: [name] Primary Care Mina Babula: Sindy Messing Other Clinician: Referring Thomson Herbers: Sindy Messing Treating Ervine Witucki/Extender: Ryan [place] in [name]: 39 Active Problems Location of Pain Severity and Description of Pain Patient Has Paino No Site Locations Pain Management and Medication Current Pain Management: Electronic Signature(s) Signed: 03/01/2019 2:30:22 PM By: [name] Signed: 03/01/2019 4:45:14 PM By: [name] Entered By: [name] on 03/01/2019 09:50:12 [name], [name]  ([id number]) -------------------------------------------------------------------------------- Patient/Caregiver Education Details Patient Name: [name] Date of Service: 03/01/2019 10:00 AM Medical Record Number: [id number] Patient Account Number: [id number] Date of Birth/Gender: [birth year] (62 y.o. M) Treating RN: [name] Primary Care Physician: Sindy Messing Other Clinician: Referring Physician: Sindy Messing Treating Physician/Extender: Ryan [place] in [name]: 4 Education Assessment Education Provided To: Patient Education Topics Provided Wound/Skin Impairment: Handouts: Caring for Your Ulcer Methods: Demonstration, Explain/Verbal Responses: State content correctly Electronic Signature(s) Signed: 03/01/2019 4:45:14 PM By: [name] Entered By: [name] on 03/01/2019 10:09:25 Whitfill, [name] ([id number]) -------------------------------------------------------------------------------- Wound Assessment Details Patient Name: [name] Date of Service: 03/01/2019 10:00 AM Medical Record Number: [id number] Patient Account Number: [id number] Date of Birth/Sex: 1956/10/26 (62 y.o. M) Treating RN: Ryan Sites Primary Care Tymia Streb: Sindy Messing Other Clinician: Referring Rylynn Kobs: Sindy Messing Treating [place] Greb/Extender: Maxwell Caul Weeks in [name]: 39 Wound Status Wound Number: 1 Primary Pressure Ulcer Etiology: Wound Location: Right Gluteus Wound Open Wounding Event:  Gradually Appeared Status: Date Acquired: 05/09/2018 Comorbid Cataracts, Congestive Heart Failure, Coronary Weeks Of [name]: 39 History: Artery Disease, Hypertension, Myocardial Clustered Wound: No Infarction, History of pressure wounds, Rheumatoid Arthritis Photos Wound Measurements Length: (cm) 0.3 Width: (cm) 1 Depth: (cm) 0.1 Area: (cm) 0.236 Volume: (cm) 0.024 % Reduction in Area: 99.8% % Reduction in Volume: 100% Epithelialization:  Medium (34-66%) Tunneling: No Undermining: No Wound Description Classification: Category/Stage IV Foul Odor Wound Margin: Distinct, outline attached Slough/Fib Exudate Amount: Small Exudate Type: Serous Exudate Color: amber After Cleansing: No rino No Wound Bed Granulation Amount: Large (67-100%) Exposed Structure Granulation Quality: Pink Fascia Exposed: Yes Necrotic Amount: None Present (0%) Fat Layer (Subcutaneous Tissue) Exposed: Yes Muscle Exposed: Yes Necrosis of Muscle: No Bone Exposed: Yes Periwound Skin Texture Texture Color Zern, [place] W. ([id number]) No Abnormalities Noted: No No Abnormalities Noted: No Callus: No Atrophie Blanche: No Crepitus: No Cyanosis: No Excoriation: No Ecchymosis: No Induration: No Erythema: No Rash: No Hemosiderin Staining: No Scarring: Yes Mottled: No Pallor: No Moisture Rubor: No No Abnormalities Noted: No Dry / Scaly: No Temperature / Pain Maceration: Yes Temperature: No Abnormality Electronic Signature(s) Signed: 03/01/2019 4:42:28 PM By: [name] Entered By: [name] on 03/01/2019 10:01:36 Rutkowski, [name] ([id number]) -------------------------------------------------------------------------------- Vitals Details Patient Name: Ryan CarrowMOORE, Rayshard W. Date of Service: 03/01/2019 10:00 AM Medical Record Number: [id number] Patient Account Number: [id number] Date of Birth/Sex: [birth year] (62 y.o. M) Treating RN: [name] Primary Care Elyjah Hazan: Sindy MessingGOMEZ, [name] Other Clinician: Referring Elianne Gubser: Sindy MessingGOMEZ, [name] Treating Iyahna Obriant/Extender: Ryan CarolinaOBSON, MICHAEL G Weeks in [name]: 39 Vital Signs Time Taken: 09:48 Temperature (F): 98.0 Height (in): 73 Pulse (bpm): 91 Weight (lbs): 206 Respiratory Rate (breaths/min): 18 Body Mass Index (BMI): 27.2 Blood Pressure (mmHg): 125/77 Reference Range: 80 - 120 mg / dl Electronic Signature(s) Signed: 03/01/2019 2:30:22 PM By: Ryan MartesWallace, RCP,RRT,[name], Sallie RCP, RRT,  [name] Entered By: Ryan MartesWallace, RCP,RRT,[name], Sallie on 03/01/2019 09:56:37

## 2019-03-22 ENCOUNTER — Encounter: Payer: Medicaid Other | Attending: Internal Medicine | Admitting: Internal Medicine

## 2019-03-22 ENCOUNTER — Other Ambulatory Visit: Payer: Self-pay

## 2019-03-22 DIAGNOSIS — I714 Abdominal aortic aneurysm, without rupture: Secondary | ICD-10-CM | POA: Insufficient documentation

## 2019-03-22 DIAGNOSIS — I252 Old myocardial infarction: Secondary | ICD-10-CM | POA: Diagnosis not present

## 2019-03-22 DIAGNOSIS — I255 Ischemic cardiomyopathy: Secondary | ICD-10-CM | POA: Diagnosis not present

## 2019-03-22 DIAGNOSIS — Z7952 Long term (current) use of systemic steroids: Secondary | ICD-10-CM | POA: Insufficient documentation

## 2019-03-22 DIAGNOSIS — E43 Unspecified severe protein-calorie malnutrition: Secondary | ICD-10-CM | POA: Diagnosis not present

## 2019-03-22 DIAGNOSIS — I110 Hypertensive heart disease with heart failure: Secondary | ICD-10-CM | POA: Insufficient documentation

## 2019-03-22 DIAGNOSIS — I509 Heart failure, unspecified: Secondary | ICD-10-CM | POA: Insufficient documentation

## 2019-03-22 DIAGNOSIS — M069 Rheumatoid arthritis, unspecified: Secondary | ICD-10-CM | POA: Diagnosis not present

## 2019-03-22 DIAGNOSIS — M8580 Other specified disorders of bone density and structure, unspecified site: Secondary | ICD-10-CM | POA: Diagnosis not present

## 2019-03-22 DIAGNOSIS — L89314 Pressure ulcer of right buttock, stage 4: Secondary | ICD-10-CM | POA: Diagnosis not present

## 2019-03-22 DIAGNOSIS — I2510 Atherosclerotic heart disease of native coronary artery without angina pectoris: Secondary | ICD-10-CM | POA: Insufficient documentation

## 2019-03-22 DIAGNOSIS — J849 Interstitial pulmonary disease, unspecified: Secondary | ICD-10-CM | POA: Insufficient documentation

## 2019-03-22 DIAGNOSIS — Z8673 Personal history of transient ischemic attack (TIA), and cerebral infarction without residual deficits: Secondary | ICD-10-CM | POA: Insufficient documentation

## 2019-03-23 NOTE — Progress Notes (Signed)
AREND, [facility] ([id number]) Visit Report for 03/22/2019 HPI Details Patient Name: [name], [name] Date of Service: 03/22/2019 11:00 AM Medical Record Number: [id number] Patient Account Number: [id number] Date of Birth/Sex: 1956-05-25 (62 y.o. M) Treating RN: Huel Coventry Primary Care Provider: Sindy Messing Other Clinician: Referring Provider: Sindy Messing Treating Provider/Extender: Maryla Morrow in Treatment: 80 History of Present Illness HPI Description: ADMISSION 06/01/18 This is a 63 year old man who is sent to [place] currently residing at [facility] skilled facility. He has a complicated recent medical history. His sister accompanies him tells [place] that he has been somewhat disabled since suffering a left basal ganglial CVA in 2018. At that point in time he also had myocardial infarctions. More recently he has been hospitalized from 04/04/18 through 04/12/18 with septic shock, coronary artery disease, congestive heart failure and an acute CVA. During this hospitalization he was noted to have sacral wounds but these were not felt to be the source of infection however the source of the infection was never really determined at that point. I believe he was sent to a skilled facility. He reports that at the hospital on 05/09/18 through 05/18/18. At that point he was noted to have a large abscess of the right buttock. He required a surgical IandD by Dr. Gerrit Friends of general surgery. Culture of this grew Citrobacter,Eikenella and Bacteroides. He had a CT scan of the pelvis that showed an abnormal soft tissue collection just caudal to the right ischial tuberosity measuring 5.9 and by 4.7. This also involved the distal right deep muscles. There was some cortical bone loss on the right ischial tuberosity and there was some concern with osteomyelitis. He was felt to require treatment for osteomyelitis based on clinical and radiographic findings. He did not have an MRI. Past medical history includes;  abdominal aortic aneurysm, carotid stenosis, hypertension, osteoarthritis, interstitial lung disease on chronic steroids, rheumatoid arthritis, congestive heart failure/ischemic cardiomyopathy with an ejection fraction of 35-40% Also noteworthy is on 05/09/18 his albumin was 2.2 on admission to hospital. I don't think this is actually been repeated. The patient states his appetite is good and he is taking supplements 100%. 06/15/18-He is seen in follow up vibration for right buttock ulcer; this is stable in appearance with minimal amount of non-viable tissue. 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 We've been using a wound VAC. This is centered over the right ischial tuberosity. He is making generally good progress. He was severely hypoalbuminemic at one time although he states that he eats well and takes his supplements. 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 We have been using silver collagen over the superior aspect of the right ischial tuberosity under wound VAC.he has made some nice improvements. The dimensions of the wound are certainly smaller. He has no exposed bone. We received lab work from [facility]. His prealbumin was 18.6 and albumin at 2.57. This is obviously still low. I am not sure what interventions are in place. I know he is on supplements. I discussed this with the patient. I told him that the albumin represents already moderate to severe protein malnutrition. This would contribute it to do difficulty healing the wound 07/20/18; I received copies of lab work from [facility] on 07/06/18 documenting a sedimentation rate of 25 which seems to be in line with what I see from  previous values however his C-reactive protein was 110.8 versus 84.7 on 06/22/18. I have looked through [facility] health Link I  don't see a prior value for either inflammatory measure. he has been treated with Pincus SanesInvanz which I think he is still on for osteomyelitis of the right ischial tuberosity as well as a soft tissue collection just caudad to the right ischial tuberosity itself. Felt to have a soft tissue abscess and/or stigmata of necrotizing fasciitis. I'm not sure who is following this. His CT scan was on 05/09/18 yet he still remains on Invanz. Looking back through the original discharge summary the Pincus Sanesnvanz was supposed to finish on 07/05/18. 08/03/18 the patient continues to make good improvement. The area over the right ischial tuberosity has filled in nicely. His developed some what looks to be candidal skin infection in the periwound. I think they've taken the wound VAC off and are applying nystatin 08/17/18; 2 week follow-up for this resident from [facility]. They resumed the wound VAC. The area continues to fill in nicely. The only problem area is the tissue over the ischial tuberosity itself which needs to come up a bit. For this reason I'm continuing the wound VAC. Otherwise this is doing remarkably well. The patient states that he is eating everything they give him although I don't see a follow-up albumin. 08/31/18; the patient states he had blood work done today. I do not have these results. He claims to be eating well. He continues with a wound VAC and the wound is making nice progress. This was a 1 time a deep stage IV wound over the right ischial tuberosity with accompanied osteomyelitis [name], [name] ([id number]) 09/14/18; patient is still using a wound VAC to the large stage IV pressure ulcer On the right ischial tuberosity area.. This is coming quite nicely he still has one Onalee HuaDavid that has some depth but I think by the next time we see him in 2 weeks we should be able to move on from the wound VAC. [facility] is [facility] at [facility]. As far as I am aware he is eating and drinking well. I have  not seen follow-up albumin studies on him recently but the message I'm getting is that he is eating well and taking his supplements. The patient states he is not very mobile and I am hopeful that we are able to insure pressure relief here however if we hadn't been ensuring pressure relief I doubt this would've closed in as well as it has even with the wound VAC. He does not complain of any systemic issues 09/28/2018 for follow-up and management of stage IV sacral ulcer that is being managed with a wound VAC. He is a resident at a long-term care facility, [facility]. The wound continues to have some depth and width some granulation of tissue on the outer borders of the wound. The wound was tender to touch today during exam. He denies any pain to the wound when it is not being touched. Recent fevers, chills, nausea, vomiting, or shortness of breath. Hopeful over the next couple of weeks he will be able to transition from the wound VAC. 10/19/2018; this patient has a stage IV over the right ischial tuberosity. The wound is come to the surface of the skin and looks very healthy at this point. His wound VAC can be discontinued. The patient apparently is eating well taking his supplements. He has no systemic complaints 11/23/2018. The patient has a stage IV wound over his  right ischial tuberosity that is now down to a quarter sized superficial area. There is no depth to this. The tissue looks healthy. No debridement was required. Continued improvement. We are using silver alginate and bordered foam 2/19; the original stage IV wound over his right ischial tuberosity. Continues to make nice progress in terms of dimensions. There is no depth to this. We have been using silver alginate and border foam 3/11; this is in follow-up of his original stage IV wound over his right ischial tuberosity. This continues to make nice progress using silver alginate and border foam vigorous offloading. He is at [facility] skilled facility. Patient complains of terrible pain in his hands and having trouble getting pain medication which he has to ask for as needed. He apparently has a history of rheumatoid arthritis and I note he is on prednisone and chloroquine. His rheumatologist is at [facility] clinic. 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 This continues to make decent progress is smaller. There is some undermining at roughly 12-3 o'clock. We have been using silver alginate 4/29; patient's wound continues to get smaller. The remaining area is epithelializing. The undermining area still is not epithelialized however. We have been using silver collagen since the last time he is here. He does not come any notes from the facility where he lives [facility]] but he claims to be eating well and vigorously offloading this area 5/20-Patient returns to clinic for right ischial wound. No notes available from facility, wound appears to be improving, we have been using Prisma and he has been offloading this area while using his wheelchair in fact he has started to use the walker again Electronic Signature(s) Signed: 03/22/2019 11:11:52 AM By: Cassandria Anger Entered By: Cassandria Anger on 03/22/2019 11:11:52 [name], [name] ([id number]) -------------------------------------------------------------------------------- Physical Exam Details Patient Name: [name] Date of Service: 03/22/2019 11:00 AM Medical Record Number: [id number] Patient Account Number: [id number] Date of Birth/Sex: 1956/02/23 (62 y.o. M) Treating RN: Huel Coventry Primary Care Provider: Sindy Messing Other Clinician: Referring Provider: Sindy Messing Treating Provider/Extender: Maryla Morrow in Treatment: 42 Constitutional alert and oriented x 3. sitting or standing blood pressure is within target range for patient.. supine blood  pressure is within target range for patient.. pulse regular and within target range for patient.[name] respirations regular, non-labored and within target range for patient.[name] temperature within target range for patient.. . . Well-nourished and well-hydrated in no acute distress. Notes Right ischial area with a small ulcer surrounded by scar tissue, ulcer base appears healthy, surrounding skin appears intact Electronic Signature(s) Signed: 03/22/2019 11:12:36 AM By: Cassandria Anger Entered By: Cassandria Anger on 03/22/2019 11:12:35 [name], [name] ([id number]) -------------------------------------------------------------------------------- Physician Orders Details Patient Name: [name] Date of Service: 03/22/2019 11:00 AM Medical Record Number: [id number] Patient Account Number: [id number] Date of Birth/Sex: November 21, 1955 (62 y.o. M) Treating RN: Huel Coventry Primary Care Provider: Sindy Messing Other Clinician: Referring Provider: Sindy Messing Treating Provider/Extender: Maryla Morrow in Treatment: 5 Verbal / Phone Orders: No Diagnosis Coding Wound Cleansing Wound #1 Right Gluteus o Clean wound with Normal Saline. Anesthetic (add to Medication List) Wound #1 Right Gluteus o Topical Lidocaine 4% cream applied to wound bed prior to debridement (In Clinic Only). Primary Wound Dressing Wound #1 Right Gluteus o Silver Collagen - moistened with hydrogel/ky jelly Secondary Dressing Wound #1 Right Gluteus o Boardered Foam Dressing Dressing  Change Frequency Wound #1 Right Gluteus o Change dressing every other day. Follow-up Appointments Wound #1 Right Gluteus o Return Appointment in 3 weeks. Off-Loading Wound #1 Right Gluteus o Turn and reposition every 2 hours Electronic Signature(s) Signed: 03/22/2019 4:16:13 PM By: Cassandria Anger Signed: 03/22/2019 5:05:53 PM By: Elliot Gurney, BSN, RN, CWS, Kim RN, BSN Entered By: Elliot Gurney, BSN, RN, CWS, Kim on 03/22/2019  11:10:26 [name], [name] ([id number]) -------------------------------------------------------------------------------- Progress Note Details Patient Name: [name], [name] Date of Service: 03/22/2019 11:00 AM Medical Record Number: [id number] Patient Account Number: [id number] Date of Birth/Sex: 08/01/1956 (62 y.o. M) Treating RN: Huel Coventry Primary Care Provider: Sindy Messing Other Clinician: Referring Provider: Sindy Messing Treating Provider/Extender: Maryla Morrow in Treatment: 42 Subjective History of Present Illness (HPI) ADMISSION 06/01/18 This is a 63 year old man who is sent to [place] currently residing at [facility] skilled facility. He has a complicated recent medical history. His sister accompanies him tells [place] that he has been somewhat disabled since suffering a left basal ganglial CVA in 2018. At that point in time he also had myocardial infarctions. More recently he has been hospitalized from 04/04/18 through 04/12/18 with septic shock, coronary artery disease, congestive heart failure and an acute CVA. During this hospitalization he was noted to have sacral wounds but these were not felt to be the source of infection however the source of the infection was never really determined at that point. I believe he was sent to a skilled facility. He reports that at the hospital on 05/09/18 through 05/18/18. At that point he was noted to have a large abscess of the right buttock. He required a surgical IandD by Dr. Gerrit Friends of general surgery. Culture of this grew Citrobacter,Eikenella and Bacteroides. He had a CT scan of the pelvis that showed an abnormal soft tissue collection just caudal to the right ischial tuberosity measuring 5.9 and by 4.7. This also involved the distal right deep muscles. There was some cortical bone loss on the right ischial tuberosity and there was some concern with osteomyelitis. He was felt to require treatment for osteomyelitis based on clinical and  radiographic findings. He did not have an MRI. Past medical history includes; abdominal aortic aneurysm, carotid stenosis, hypertension, osteoarthritis, interstitial lung disease on chronic steroids, rheumatoid arthritis, congestive heart failure/ischemic cardiomyopathy with an ejection fraction of 35-40% Also noteworthy is on 05/09/18 his albumin was 2.2 on admission to hospital. I don't think this is actually been repeated. The patient states his appetite is good and he is taking supplements 100%. 06/15/18-He is seen in follow up vibration for right buttock ulcer; this is stable in appearance with minimal amount of non-viable tissue. 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 We've been using a wound VAC. This is centered over the right ischial tuberosity. He is making generally good progress. He was severely hypoalbuminemic at one time although he states that he eats well and takes his supplements. 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 We have been using silver collagen over the superior aspect of the right ischial tuberosity under wound VAC.he has made some nice improvements. The dimensions of the wound are certainly smaller. He has no exposed bone. We received lab work from [facility]. His prealbumin was 18.6 and albumin at 2.57. This is obviously still low. I am not sure what interventions are  in place. I know he is on supplements. I discussed this with the patient. I told him that the albumin represents already moderate to severe protein malnutrition. This would contribute it to do difficulty healing the wound 07/20/18; I received copies of lab work from [facility] on 07/06/18 documenting a sedimentation rate of 25 which seems to be in line with what I see from previous values however his C-reactive  protein was 110.8 versus 84.7 on 06/22/18. I have looked through [facility] health Link I don't see a prior value for either inflammatory measure. he has been treated with [name] which I think he is still on for osteomyelitis of the right ischial tuberosity as well as a soft tissue collection just caudad to the right ischial tuberosity itself. Felt to have a soft tissue abscess and/or stigmata of necrotizing fasciitis. I'm not sure who is following this. His CT scan was on 05/09/18 yet he still remains on Invanz. Looking back through the original discharge summary the [name] was supposed to finish on 07/05/18. 08/03/18 the patient continues to make good improvement. The area over the right ischial tuberosity has filled in nicely. His developed some what looks to be candidal skin infection in the periwound. I think they've taken the wound VAC off and are applying nystatin 08/17/18; 2 week follow-up for this resident from [facility] [facility]. They resumed the wound VAC. The area continues to fill in nicely. The only problem area is the tissue over the ischial tuberosity itself which needs to come up a bit. For this reason I'm continuing the wound VAC. Otherwise this is doing remarkably well. The patient states that he is eating everything they give him although I don't see a follow-up albumin. 08/31/18; the patient states he had blood work done today. I do not have these results. He claims to be eating well. He continues with a wound VAC and the wound is making nice progress. 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 This is coming quite nicely he still has one Onalee Hua that has some depth but I think by the next time we see him in 2 weeks we should be able to move on from the wound VAC. He is still at skilled care at [facility]. As far as  I am aware he is eating and drinking well. I have not seen follow-up albumin studies on him recently but the message I'm getting is that he is eating well [name], STACEY. ([id number]) and taking his supplements. The patient states he is not very mobile and I am hopeful that we are able to insure pressure relief here however if we hadn't been ensuring pressure relief I doubt this would've closed in as well as it has even with the wound VAC. He does not complain of any systemic issues 09/28/2018 for follow-up and management of stage IV sacral ulcer that is being managed with a wound VAC. He is a resident at a long-term care facility, [facility]. The wound continues to have some depth and width some granulation of tissue on the outer borders of the wound. The wound was tender to touch today during exam. He denies any pain to the wound when it is not being touched. Recent fevers, chills, nausea, vomiting, or shortness of breath. Hopeful over the next couple of weeks he will be  able to transition from the wound VAC. 10/19/2018; this patient has a stage IV over the right ischial tuberosity. The wound is come to the surface of the skin and looks very healthy at this point. His wound VAC can be discontinued. The patient apparently is eating well taking his supplements. He has no systemic complaints 11/23/2018. The patient has a stage IV wound over his right ischial tuberosity that is now down to a quarter sized superficial area. There is no depth to this. The tissue looks healthy. No debridement was required. Continued improvement. We are using silver alginate and bordered foam 2/19; the original stage IV wound over his right ischial tuberosity. Continues to make nice progress in terms of dimensions. There is no depth to this. We have been using silver alginate and border foam 3/11; this is in follow-up of his original stage IV wound over his right ischial tuberosity. This continues to make nice  progress using silver alginate and border foam vigorous offloading. He is at [facility] skilled facility. Patient complains of terrible pain in his hands and having trouble getting pain medication which he has to ask for as needed. He apparently has a history of rheumatoid arthritis and I note he is on prednisone and chloroquine. His rheumatologist is at [facility] clinic. 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 This continues to make decent progress is smaller. There is some undermining at roughly 12-3 o'clock. We have been using silver alginate 4/29; patient's wound continues to get smaller. The remaining area is epithelializing. The undermining area still is not epithelialized however. We have been using silver collagen since the last time he is here. He does not come any notes from the facility where he lives [facility]] but he claims to be eating well and vigorously offloading this area 5/20-Patient returns to clinic for right ischial wound. No notes available from facility, wound appears to be improving, we have been using Prisma and he has been offloading this area while using his wheelchair in fact he has started to use the walker again Objective Constitutional alert and oriented x 3. sitting or standing blood pressure is within target range for patient.. supine blood pressure is within target range for patient.. pulse regular and within target range for patient.[name] respirations regular, non-labored and within target range for patient.[name] temperature within target range for patient.. Well-nourished and well-hydrated in no acute distress. Vitals Time Taken: 10:48 AM, Height: 73 in, Weight: 206 lbs, BMI: 27.2, Temperature: 98.2 F, Pulse: 98 bpm, Respiratory Rate: 16 breaths/min, Blood Pressure: 108/71 mmHg. General Notes: Right ischial area with a small ulcer surrounded by scar tissue, ulcer  base appears healthy, surrounding skin appears intact Integumentary (Hair, Skin) Wound #1 status is Open. Original cause of wound was Gradually Appeared. The wound is located on the Right Gluteus. The wound measures 0.2cm length x 0.6cm width x 0.1cm depth; 0.094cm^2 area and 0.009cm^3 volume. There is Fat Layer (Subcutaneous Tissue) Exposed exposed. There is no tunneling or undermining noted. There is a small amount of serous drainage noted. The wound margin is distinct with the outline attached to the wound base. There is large (67-100%) pink granulation within the wound bed. There is no necrotic tissue within the wound bed. [name], DUNHAM. ([id number]) Plan Wound Cleansing: Wound #1 Right Gluteus: Clean wound with Normal Saline. Anesthetic (add to Medication List): Wound #1 Right Gluteus: Topical Lidocaine  4% cream applied to wound bed prior to debridement (In Clinic Only). Primary Wound Dressing: Wound #1 Right Gluteus: Silver Collagen - moistened with hydrogel/ky jelly Secondary Dressing: Wound #1 Right Gluteus: Boardered Foam Dressing Dressing Change Frequency: Wound #1 Right Gluteus: Change dressing every other day. Follow-up Appointments: Wound #1 Right Gluteus: Return Appointment in 3 weeks. Off-Loading: Wound #1 Right Gluteus: Turn and reposition every 2 hours 1. Continue using Prisma with dressing change frequency as before 2. Continue offloading while sitting in the wheelchair, 3. Return to clinic in 3 weeks Electronic Signature(s) Signed: 03/22/2019 11:13:16 AM By: Cassandria AngerMadduri, Stevey Stapleton Entered By: Cassandria AngerMadduri, Hitomi Slape on 03/22/2019 11:13:15 [name], [name] ([id number]) -------------------------------------------------------------------------------- SuperBill Details Patient Name: [name], [name] Date of Service: 03/22/2019 Medical Record Number: [id number] Patient Account Number: [id number] Date of Birth/Sex: April 23, 1956 [age] y.o. M) Treating RN: Huel CoventryWoody, Kim Primary Care  Provider: Sindy MessingGOMEZ, ROGER Other Clinician: Referring Provider: Sindy MessingGOMEZ, ROGER Treating Provider/Extender: Maryla MorrowMadduri, Terryn Rosenkranz Weeks in Treatment: 42 Diagnosis Coding ICD-10 Codes Code Description L89.314 Pressure ulcer of right buttock, stage 4 T81.31XD Disruption of external operation (surgical) wound, not elsewhere classified, subsequent encounter E43 Unspecified severe protein-calorie malnutrition Facility Procedures CPT4 Code: 7829562176100138 Description: 99213 - WOUND CARE VISIT-LEV 3 EST PT Modifier: Quantity: 1 Physician Procedures CPT4 Code: 30865786770416 Description: 99213 - WC PHYS LEVEL 3 - EST PT ICD-10 Diagnosis Description L89.314 Pressure ulcer of right buttock, stage 4 Modifier: Quantity: 1 Electronic Signature(s) Signed: 03/22/2019 5:13:39 PM By: Elliot GurneyWoody, BSN, RN, CWS, Kim RN, BSN Previous Signature: 03/22/2019 11:13:38 AM Version By: Cassandria AngerMadduri, Najla Aughenbaugh Entered By: Elliot GurneyWoody, BSN, RN, CWS, Kim on 03/22/2019 17:13:39

## 2019-03-23 NOTE — Progress Notes (Addendum)
[name], Dawn W. ([id number]) Visit Report for 03/22/2019 Arrival Information Details Patient Name: [name], [name] Date of Service: 03/22/2019 11:00 AM Medical Record Number: [id number] Patient Account Number: [id number] Date of Birth/Sex: 27-Jun-1956 (63 y.o. M) Treating RN: Curtis Sitesorthy, Joanna Primary Care Kynslei Art: Sindy MessingGOMEZ, ROGER Other Clinician: Referring Kuron Docken: Sindy MessingGOMEZ, ROGER Treating Jakim Drapeau/Extender: Maryla MorrowMadduri, Murthy Weeks in Treatment: 42 Visit Information History Since Last Visit Added or deleted any medications: No Patient Arrived: Wheel Chair Any new allergies or adverse reactions: No Arrival Time: 10:43 Had a fall or experienced change in No activities of daily living that may affect Accompanied By: self risk of falls: Transfer Assistance: Hoyer Lift Signs or symptoms of abuse/neglect since last visito No Patient Identification Verified: Yes Hospitalized since last visit: No Secondary Verification Process Completed: Yes Implantable device outside of the clinic excluding No Patient Requires Transmission-Based No cellular tissue based products placed in the center Precautions: since last visit: Patient Has Alerts: No Has Dressing in Place as Prescribed: Yes Pain Present Now: No Electronic Signature(s) Signed: 03/22/2019 2:38:33 PM By: Curtis Sitesorthy, Joanna Entered By: Curtis Sitesorthy, Joanna on 03/22/2019 10:48:36 Adames, Troi W. ([id number]) -------------------------------------------------------------------------------- Clinic Level of Care Assessment Details Patient Name: [name], [name] Date of Service: 03/22/2019 11:00 AM Medical Record Number: [id number] Patient Account Number: [id number] Date of Birth/Sex: 27-Jun-1956 (63 y.o. M) Treating RN: Huel CoventryWoody, Kim Primary Care Marvell Tamer: Sindy MessingGOMEZ, ROGER Other Clinician: Referring Anibal Quinby: Sindy MessingGOMEZ, ROGER Treating Adessa Primiano/Extender: Maryla MorrowMadduri, Murthy Weeks in Treatment: 42 Clinic Level of Care Assessment Items TOOL 4 Quantity Score []  - Use  when only an EandM is performed on FOLLOW-UP visit 0 ASSESSMENTS - Nursing Assessment / Reassessment []  - Reassessment of Co-morbidities (includes updates in patient status) 0 X- 1 5 Reassessment of Adherence to Treatment Plan ASSESSMENTS - Wound and Skin Assessment / Reassessment X - Simple Wound Assessment / Reassessment - one wound 1 5 []  - 0 Complex Wound Assessment / Reassessment - multiple wounds []  - 0 Dermatologic / Skin Assessment (not related to wound area) ASSESSMENTS - Focused Assessment []  - Circumferential Edema Measurements - multi extremities 0 []  - 0 Nutritional Assessment / Counseling / Intervention []  - 0 Lower Extremity Assessment (monofilament, tuning fork, pulses) []  - 0 Peripheral Arterial Disease Assessment (using hand held doppler) ASSESSMENTS - Ostomy and/or Continence Assessment and Care []  - Incontinence Assessment and Management 0 []  - 0 Ostomy Care Assessment and Management (repouching, etc.) PROCESS - Coordination of Care X - Simple Patient / Family Education for ongoing care 1 15 []  - 0 Complex (extensive) Patient / Family Education for ongoing care X- 1 10 Staff obtains ChiropractorConsents, Records, Test Results / Process Orders []  - 0 Staff telephones HHA, Nursing Homes / Clarify orders / etc []  - 0 Routine Transfer to another Facility (non-emergent condition) []  - 0 Routine Hospital Admission (non-emergent condition) []  - 0 New Admissions / Manufacturing engineernsurance Authorizations / Ordering NPWT, Apligraf, etc. []  - 0 Emergency Hospital Admission (emergent condition) X- 1 10 Simple Discharge Coordination [place], Delante W. ([id number]) []  - 0 Complex (extensive) Discharge Coordination PROCESS - Special Needs []  - Pediatric / Minor Patient Management 0 []  - 0 Isolation Patient Management []  - 0 Hearing / Language / Visual special needs []  - 0 Assessment of Community assistance (transportation, D/C planning, etc.) []  - 0 Additional assistance / Altered  mentation []  - 0 Support Surface(s) Assessment (bed, cushion, seat, etc.) INTERVENTIONS - Wound Cleansing / Measurement X - Simple Wound Cleansing - one wound 1 5 []  - 0 Complex Wound Cleansing -  multiple wounds X- 1 5 Wound Imaging (photographs - any number of wounds) []  - 0 Wound Tracing (instead of photographs) X- 1 5 Simple Wound Measurement - one wound []  - 0 Complex Wound Measurement - multiple wounds INTERVENTIONS - Wound Dressings []  - Small Wound Dressing one or multiple wounds 0 X- 1 15 Medium Wound Dressing one or multiple wounds []  - 0 Large Wound Dressing one or multiple wounds []  - 0 Application of Medications - topical []  - 0 Application of Medications - injection INTERVENTIONS - Miscellaneous []  - External ear exam 0 []  - 0 Specimen Collection (cultures, biopsies, blood, body fluids, etc.) []  - 0 Specimen(s) / Culture(s) sent or taken to Lab for analysis X- 1 10 Patient Transfer (multiple staff / Nurse, adult / Similar devices) []  - 0 Simple Staple / Suture removal (25 or less) []  - 0 Complex Staple / Suture removal (26 or more) []  - 0 Hypo / Hyperglycemic Management (close monitor of Blood Glucose) []  - 0 Ankle / Brachial Index (ABI) - do not check if billed separately X- 1 5 Vital Signs Lerette, Kailo W. ([id number]) Has the patient been seen at the hospital within the last three years: Yes Total Score: 90 Level Of Care: New/Established - Level 3 Electronic Signature(s) Signed: 03/22/2019 5:24:40 PM By: Elliot Gurney, BSN, RN, CWS, Kim RN, BSN Entered By: Elliot Gurney, BSN, RN, CWS, Kim on 03/22/2019 17:13:30 Stonehouse, Loreli [name] ([id number]) -------------------------------------------------------------------------------- Encounter Discharge Information Details Patient Name: [name] Date of Service: 03/22/2019 11:00 AM Medical Record Number: [id number] Patient Account Number: [id number] Date of Birth/Sex: 28-Jan-1956 (63 y.o. M) Treating RN: Arnette Norris Primary Care Aubreanna Percle: Sindy Messing Other Clinician: Referring Marilena Trevathan: Sindy Messing Treating Adrine Hayworth/Extender: Maryla Morrow in Treatment: 8 Encounter Discharge Information Items Discharge Condition: Stable Ambulatory Status: Wheelchair Discharge Destination: Home Transportation: Private Auto Accompanied By: self Schedule Follow-up Appointment: Yes Clinical Summary of Care: Electronic Signature(s) Signed: 03/22/2019 11:23:34 AM By: Arnette Norris Entered By: Arnette Norris on 03/22/2019 11:23:34 Valli, Loreli [name] ([id number]) -------------------------------------------------------------------------------- Lower Extremity Assessment Details Patient Name: Ryan Carrow. Date of Service: 03/22/2019 11:00 AM Medical Record Number: [id number] Patient Account Number: [id number] Date of Birth/Sex: [birth year] (63 y.o. M) Treating RN: Curtis Sites Primary Care Chloe Baig: Sindy Messing Other Clinician: Referring Percell Lamboy: Sindy Messing Treating Tee Richeson/Extender: Maryla Morrow in Treatment: 53 Electronic Signature(s) Signed: 03/22/2019 2:38:33 PM By: Curtis Sites Entered By: Curtis Sites on 03/22/2019 10:48:53 Peregrina, Kevonta W. ([id number]) -------------------------------------------------------------------------------- Multi Wound Chart Details Patient Name: BUDNY, Loreli [name]. Date of Service: 03/22/2019 11:00 AM Medical Record Number: [id number] Patient Account Number: [id number] Date of Birth/Sex: [birth year] (63 y.o. M) Treating RN: Huel Coventry Primary Care Cynthia Cogle: Sindy Messing Other Clinician: Referring Marlen Mollica: Sindy Messing Treating Joana Nolton/Extender: Maryla Morrow in Treatment: 42 Vital Signs Height(in): 73 Pulse(bpm): 98 Weight(lbs): 206 Blood Pressure(mmHg): 108/71 Body Mass Index(BMI): 27 Temperature(F): 98.2 Respiratory Rate 16 (breaths/min): Photos: [N/A:N/A] Wound Location: Right Gluteus N/A N/A Wounding Event: Gradually  Appeared N/A N/A Primary Etiology: Pressure Ulcer N/A N/A Comorbid History: Cataracts, Congestive Heart N/A N/A Failure, Coronary Artery Disease, Hypertension, Myocardial Infarction, History of pressure wounds, Rheumatoid Arthritis Date Acquired: 05/09/2018 N/A N/A Weeks of Treatment: 42 N/A N/A Wound Status: Open N/A N/A Measurements L x W x D 0.2x0.6x0.1 N/A N/A (cm) Area (cm) : 0.094 N/A N/A Volume (cm) : 0.009 N/A N/A % Reduction in Area: 99.90% N/A N/A % Reduction in Volume: 100.00% N/A N/A Classification: Category/Stage IV N/A N/A Exudate Amount: Small N/A N/A Exudate  Type: Serous N/A N/A Exudate Color: amber N/A N/A Wound Margin: Distinct, outline attached N/A N/A Granulation Amount: Large (67-100%) N/A N/A Granulation Quality: Pink N/A N/A Necrotic Amount: None Present (0%) N/A N/A Exposed Structures: Fat Layer (Subcutaneous N/A N/A Tissue) Exposed: Yes Fascia: No [name], Bria W. ([id number]) Muscle: No Bone: No Epithelialization: Medium (34-66%) N/A N/A Treatment Notes Electronic Signature(s) Signed: 03/22/2019 5:05:53 PM By: Elliot GurneyWoody, BSN, RN, CWS, Kim RN, BSN Entered By: Elliot GurneyWoody, BSN, RN, CWS, Kim on 03/22/2019 11:08:00 [name], Reinhold W. ([id number]) -------------------------------------------------------------------------------- Multi-Disciplinary Care Plan Details Patient Name: [name], Sheehan W. Date of Service: 03/22/2019 11:00 AM Medical Record Number: [id number] Patient Account Number: [id number] Date of Birth/Sex: 1956-06-04 (63 y.o. M) Treating RN: Huel CoventryWoody, Kim Primary Care Radley Teston: Sindy MessingGOMEZ, ROGER Other Clinician: Referring Sheron Tallman: Sindy MessingGOMEZ, ROGER Treating Zyren Sevigny/Extender: Maryla MorrowMadduri, Murthy Weeks in Treatment: 10442 Active Inactive Electronic Signature(s) Signed: 05/04/2019 9:48:00 AM By: Elliot GurneyWoody, BSN, RN, CWS, Kim RN, BSN Previous Signature: 03/22/2019 5:05:53 PM Version By: Elliot GurneyWoody, BSN, RN, CWS, Kim RN, BSN Entered By: Elliot GurneyWoody, BSN, RN, CWS, Kim on 05/04/2019  09:47:59 Stroope, Loreli SlotPHILLIP W. ([id number]) -------------------------------------------------------------------------------- Pain Assessment Details Patient Name: [name], Hoover W. Date of Service: 03/22/2019 11:00 AM Medical Record Number: [id number] Patient Account Number: [id number] Date of Birth/Sex: 1956-06-04 (63 y.o. M) Treating RN: Curtis Sitesorthy, Joanna Primary Care Park Beck: Sindy MessingGOMEZ, ROGER Other Clinician: Referring Vida Nicol: Sindy MessingGOMEZ, ROGER Treating Arath Kaigler/Extender: Maryla MorrowMadduri, Murthy Weeks in Treatment: 42 Active Problems Location of Pain Severity and Description of Pain Patient Has Paino No Site Locations Pain Management and Medication Current Pain Management: Electronic Signature(s) Signed: 03/22/2019 2:38:33 PM By: Curtis Sitesorthy, Joanna Entered By: Curtis Sitesorthy, Joanna on 03/22/2019 10:48:43 Gayman, Giann W. ([id number]) -------------------------------------------------------------------------------- Wound Assessment Details Patient Name: [name], Herbie W. Date of Service: 03/22/2019 11:00 AM Medical Record Number: [id number] Patient Account Number: [id number] Date of Birth/Sex: 1956-06-04 (63 y.o. M) Treating RN: Curtis Sitesorthy, Joanna Primary Care Nancy Manuele: Sindy MessingGOMEZ, ROGER Other Clinician: Referring Alejandro Gamel: Sindy MessingGOMEZ, ROGER Treating Janann Boeve/Extender: Maryla MorrowMadduri, Murthy Weeks in Treatment: 42 Wound Status Wound Number: 1 Primary Pressure Ulcer Etiology: Wound Location: Right Gluteus Wound Open Wounding Event: Gradually Appeared Status: Date Acquired: 05/09/2018 Comorbid Cataracts, Congestive Heart Failure, Coronary Weeks Of Treatment: 42 History: Artery Disease, Hypertension, Myocardial Clustered Wound: No Infarction, History of pressure wounds, Rheumatoid Arthritis Photos Wound Measurements Length: (cm) 0.2 Width: (cm) 0.6 Depth: (cm) 0.1 Area: (cm) 0.094 Volume: (cm) 0.009 % Reduction in Area: 99.9% % Reduction in Volume: 100% Epithelialization: Medium (34-66%) Tunneling: No Undermining:  No Wound Description Classification: Category/Stage IV Foul Odor Wound Margin: Distinct, outline attached Slough/Fib Exudate Amount: Small Exudate Type: Serous Exudate Color: amber After Cleansing: No rino No Wound Bed Granulation Amount: Large (67-100%) Exposed Structure Granulation Quality: Pink Fascia Exposed: No Necrotic Amount: None Present (0%) Fat Layer (Subcutaneous Tissue) Exposed: Yes Muscle Exposed: No Bone Exposed: No Electronic Signature(s) Signed: 03/22/2019 2:38:33 PM By: Gardiner Rhymeorthy, Joanna Quayle, Taylin W. ([id number]) Entered By: Curtis Sitesorthy, Joanna on 03/22/2019 10:58:03 Altice, Hasani W. ([id number]) -------------------------------------------------------------------------------- Vitals Details Patient Name: [name], Khylon W. Date of Service: 03/22/2019 11:00 AM Medical Record Number: [id number] Patient Account Number: [id number] Date of Birth/Sex: 1956-06-04 (63 y.o. M) Treating RN: Curtis Sitesorthy, Joanna Primary Care Mckenzee Beem: Sindy MessingGOMEZ, ROGER Other Clinician: Referring Yanelie Abraha: Sindy MessingGOMEZ, ROGER Treating Alanys Godino/Extender: Maryla MorrowMadduri, Murthy Weeks in Treatment: 42 Vital Signs Time Taken: 10:48 Temperature (F): 98.2 Height (in): 73 Pulse (bpm): 98 Weight (lbs): 206 Respiratory Rate (breaths/min): 16 Body Mass Index (BMI): 27.2 Blood Pressure (mmHg): 108/71 Reference Range: 80 - 120 mg / dl Electronic Signature(s) Signed: 03/22/2019 2:38:33 PM By:  Dorthy, Mardene CelesteJoanna Entered ByCurtis Sites: Dorthy, Joanna on 03/22/2019 10:49:12

## 2019-04-12 ENCOUNTER — Ambulatory Visit: Payer: Medicaid Other | Admitting: Internal Medicine

## 2019-04-19 ENCOUNTER — Ambulatory Visit: Payer: Medicaid Other | Admitting: Internal Medicine

## 2019-05-31 ENCOUNTER — Other Ambulatory Visit: Payer: Self-pay

## 2019-05-31 ENCOUNTER — Encounter: Payer: Medicaid Other | Attending: Internal Medicine | Admitting: Internal Medicine

## 2019-05-31 DIAGNOSIS — L89314 Pressure ulcer of right buttock, stage 4: Secondary | ICD-10-CM | POA: Diagnosis present

## 2019-05-31 DIAGNOSIS — I252 Old myocardial infarction: Secondary | ICD-10-CM | POA: Diagnosis not present

## 2019-05-31 DIAGNOSIS — M069 Rheumatoid arthritis, unspecified: Secondary | ICD-10-CM | POA: Insufficient documentation

## 2019-05-31 DIAGNOSIS — Z7952 Long term (current) use of systemic steroids: Secondary | ICD-10-CM | POA: Insufficient documentation

## 2019-05-31 DIAGNOSIS — I509 Heart failure, unspecified: Secondary | ICD-10-CM | POA: Insufficient documentation

## 2019-05-31 DIAGNOSIS — Z8673 Personal history of transient ischemic attack (TIA), and cerebral infarction without residual deficits: Secondary | ICD-10-CM | POA: Insufficient documentation

## 2019-05-31 DIAGNOSIS — E43 Unspecified severe protein-calorie malnutrition: Secondary | ICD-10-CM | POA: Insufficient documentation

## 2019-05-31 DIAGNOSIS — Z87891 Personal history of nicotine dependence: Secondary | ICD-10-CM | POA: Diagnosis not present

## 2019-05-31 DIAGNOSIS — Z8249 Family history of ischemic heart disease and other diseases of the circulatory system: Secondary | ICD-10-CM | POA: Diagnosis not present

## 2019-05-31 DIAGNOSIS — I2510 Atherosclerotic heart disease of native coronary artery without angina pectoris: Secondary | ICD-10-CM | POA: Insufficient documentation

## 2019-05-31 DIAGNOSIS — I255 Ischemic cardiomyopathy: Secondary | ICD-10-CM | POA: Diagnosis not present

## 2019-05-31 DIAGNOSIS — I714 Abdominal aortic aneurysm, without rupture: Secondary | ICD-10-CM | POA: Insufficient documentation

## 2019-05-31 DIAGNOSIS — I110 Hypertensive heart disease with heart failure: Secondary | ICD-10-CM | POA: Diagnosis not present

## 2019-05-31 DIAGNOSIS — M8580 Other specified disorders of bone density and structure, unspecified site: Secondary | ICD-10-CM | POA: Diagnosis not present

## 2019-05-31 DIAGNOSIS — J849 Interstitial pulmonary disease, unspecified: Secondary | ICD-10-CM | POA: Diagnosis not present

## 2019-05-31 NOTE — Progress Notes (Signed)
[name], [name] ([id number]) Visit Report for 05/31/2019 Abuse/Suicide Risk Screen Details Patient Name: [name], [name] Date of Service: 05/31/2019 9:45 AM Medical Record Number: [id number] Patient Account Number: [id number] Date of Birth/Sex: 13-Dec-1955 (63 y.o. M) Treating RN: Montey Hora Primary Care Negin Hegg: Domenica Fail Other Clinician: Referring Annastacia Duba: Virgel Bouquet Treating Sharone Almond/Extender: Ricard Dillon Weeks in Treatment: 0 Abuse/Suicide Risk Screen Items Answer ABUSE RISK SCREEN: Has anyone close to you tried to hurt or harm you recentlyo No Do you feel uncomfortable with anyone in your familyo No Has anyone forced you do things that you didnot want to doo No Electronic Signature(s) Signed: 05/31/2019 4:41:53 PM By: Montey Hora Entered By: Montey Hora on 05/31/2019 10:00:54 Festa, Shyler Viona Gilmore ([id number]) -------------------------------------------------------------------------------- Activities of Daily Living Details Patient Name: [name], [name] Date of Service: 05/31/2019 9:45 AM Medical Record Number: [id number] Patient Account Number: [id number] Date of Birth/Sex: [birth year] (63 y.o. M) Treating RN: Montey Hora Primary Care Torrell Krutz: Domenica Fail Other Clinician: Referring Morganna Styles: Virgel Bouquet Treating Erikah Thumm/Extender: Tito Dine in Treatment: 0 Activities of Daily Living Items Answer Activities of Daily Living (Please select one for each item) Drive Automobile Not Able Take Medications Need Assistance Use Telephone Completely Able Care for Appearance Need Assistance Use Toilet Need Assistance Bath / Shower Need Assistance Dress Self Need Assistance Feed Self Completely Able Walk Need Assistance Get In / Out Bed Need Assistance Housework Need Assistance Prepare Meals Need Assistance Handle Money Need Assistance Shop for Self Not Able Electronic Signature(s) Signed: 05/31/2019 4:41:53 PM By: Montey Hora Entered By: Montey Hora on 05/31/2019 10:01:48 [name] ([id number]) -------------------------------------------------------------------------------- Education Screening Details Patient Name: [name] Date of Service: 05/31/2019 9:45 AM Medical Record Number: [id number] Patient Account Number: [id number] Date of Birth/Sex: 1956-05-30 (63 y.o. M) Treating RN: Montey Hora Primary Care Aditri Louischarles: Domenica Fail Other Clinician: Referring Earlie Schank: Virgel Bouquet Treating Brentley Horrell/Extender: Tito Dine in Treatment: 0 Primary Learner Assessed: Patient Learning Preferences/Education Level/Primary Language Learning Preference: Explanation, Demonstration Highest Education Level: Grade School Preferred Language: English Cognitive Barrier Language Barrier: No Translator Needed: No Memory Deficit: No Emotional Barrier: No Cultural/Religious Beliefs Affecting Medical Care: No Physical Barrier Impaired Vision: No Impaired Hearing: No Decreased Hand dexterity: No Knowledge/Comprehension Knowledge Level: Medium Comprehension Level: Medium Ability to understand written Medium instructions: Ability to understand verbal Medium instructions: Motivation Anxiety Level: Calm Cooperation: Cooperative Education Importance: Acknowledges Need Interest in Health Problems: Asks Questions Perception: Coherent Willingness to Engage in Self- Medium Management Activities: Readiness to Engage in Self- Medium Management Activities: Electronic Signature(s) Signed: 05/31/2019 4:41:53 PM By: Montey Hora Entered By: Montey Hora on 05/31/2019 10:02:16 FOUAD, TAUL ([id number]) -------------------------------------------------------------------------------- Fall Risk Assessment Details Patient Name: [name] Date of Service: 05/31/2019 9:45 AM Medical Record Number: [id number] Patient Account Number: [id number] Date of Birth/Sex: 1956-06-11 (63  y.o. M) Treating RN: Montey Hora Primary Care Luvena Wentling: Domenica Fail Other Clinician: Referring Nahzir Pohle: Virgel Bouquet Treating Liyana Suniga/Extender: Tito Dine in Treatment: 0 Fall Risk Assessment Items Have you had 2 or more falls in the last 12 monthso 0 No Have you had any fall that resulted in injury in the last 12 monthso 0 No FALLS RISK SCREEN History of falling - immediate or within 3 months 0 No Secondary diagnosis (Do you have 2 or more medical diagnoseso) 0 No Ambulatory aid None/bed rest/wheelchair/nurse 0 No Crutches/cane/walker 15 Yes Furniture 0 No Intravenous therapy Access/Saline/Heparin Lock 0 No Gait/Transferring Normal/ bed rest/ wheelchair 0 No Weak (  short steps with or without shuffle, stooped but able to lift head while 10 Yes walking, may seek support from furniture) Impaired (short steps with shuffle, may have difficulty arising from chair, head 20 Yes down, impaired balance) Mental Status Oriented to own ability 0 Yes Electronic Signature(s) Signed: 05/31/2019 4:41:53 PM By: Curtis Sites Entered By: Curtis Sites on 05/31/2019 10:02:28 [name], [name] ([id number]) -------------------------------------------------------------------------------- Foot Assessment Details Patient Name: [name] Date of Service: 05/31/2019 9:45 AM Medical Record Number: [id number] Patient Account Number: [id number] Date of Birth/Sex: [birth year] (63 y.o. M) Treating RN: Curtis Sites Primary Care Jarry Manon: Sindy Messing Other Clinician: Referring Dyanna Seiter: Bernette Redbird Treating Laiyla Slagel/Extender: Altamese [place] in Treatment: 0 Foot Assessment Items Site Locations + = Sensation present, - = Sensation absent, C = Callus, U = Ulcer R = Redness, W = Warmth, M = Maceration, PU = Pre-ulcerative lesion F = Fissure, S = Swelling, D = Dryness Assessment Right: Left: Other Deformity: No No Prior Foot Ulcer: No No Prior Amputation: No  No Charcot Joint: No No Ambulatory Status: Ambulatory With Help Assistance Device: Walker Gait: Surveyor, mining) Signed: 05/31/2019 4:41:53 PM By: Curtis Sites Entered By: Curtis Sites on 05/31/2019 10:02:50 [name], [name] ([id number]) -------------------------------------------------------------------------------- Nutrition Risk Screening Details Patient Name: [name] Date of Service: 05/31/2019 9:45 AM Medical Record Number: [id number] Patient Account Number: [id number] Date of Birth/Sex: 1956-05-27 (63 y.o. M) Treating RN: Curtis Sites Primary Care Nakenya Theall: Sindy Messing Other Clinician: Referring Munir Victorian: Bernette Redbird Treating Nahdia Doucet/Extender: Maxwell Caul Weeks in Treatment: 0 Height (in): 73 Weight (lbs): 155 Body Mass Index (BMI): 20.4 Nutrition Risk Screening Items Score Screening NUTRITION RISK SCREEN: I have an illness or condition that made me change the kind and/or amount of 0 No food I eat I eat fewer than two meals per day 0 No I eat few fruits and vegetables, or milk products 0 No I have three or more drinks of beer, liquor or wine almost every day 0 No I have tooth or mouth problems that make it hard for me to eat 0 No I don't always have enough money to buy the food I need 0 No I eat alone most of the time 0 No I take three or more different prescribed or over-the-counter drugs a day 1 Yes Without wanting to, I have lost or gained 10 pounds in the last six months 0 No I am not always physically able to shop, cook and/or feed myself 0 No Nutrition Protocols Good Risk Protocol 0 No interventions needed Moderate Risk Protocol High Risk Proctocol Risk Level: Good Risk Score: 1 Electronic Signature(s) Signed: 05/31/2019 4:41:53 PM By: Curtis Sites Entered By: Curtis Sites on 05/31/2019 10:02:36

## 2019-06-01 NOTE — Progress Notes (Signed)
[name], [name] ([id number]) Visit Report for 05/31/2019 HPI Details Patient Name: [name], [name] Date of Service: 05/31/2019 9:45 AM Medical Record Number: [id number] Patient Account Number: [id number] Date of Birth/Sex: Apr 27, 1956 (63 y.o. M) Treating RN: Arnette Norris Primary Care Provider: Sindy Messing Other Clinician: Referring Provider: Bernette Redbird Treating Provider/Extender: Altamese Marbury in Treatment: 0 History of Present Illness HPI Description: ADMISSION 06/01/18 This is a 63 year old man who is sent to [place] currently residing at [facility] skilled facility. He has a complicated recent medical history. His sister accompanies him tells [place] that he has been somewhat disabled since suffering a left basal ganglial CVA in 2018. At that point in time he also had myocardial infarctions. More recently he has been hospitalized from 04/04/18 through 04/12/18 with septic shock, coronary artery disease, congestive heart failure and an acute CVA. During this hospitalization he was noted to have sacral wounds but these were not felt to be the source of infection however the source of the infection was never really determined at that point. I believe he was sent to a skilled facility. He reports that at the hospital on 05/09/18 through 05/18/18. At that point he was noted to have a large abscess of the right buttock. He required a surgical IandD by Dr. Gerrit Friends of general surgery. Culture of this grew Citrobacter,Eikenella and Bacteroides. He had a CT scan of the pelvis that showed an abnormal soft tissue collection just caudal to the right ischial tuberosity measuring 5.9 and by 4.7. This also involved the distal right deep muscles. There was some cortical bone loss on the right ischial tuberosity and there was some concern with osteomyelitis. He was felt to require treatment for osteomyelitis based on clinical and radiographic findings. He did not have an MRI. Past medical history  includes; abdominal aortic aneurysm, carotid stenosis, hypertension, osteoarthritis, interstitial lung disease on chronic steroids, rheumatoid arthritis, congestive heart failure/ischemic cardiomyopathy with an ejection fraction of 35-40% Also noteworthy is on 05/09/18 his albumin was 2.2 on admission to hospital. I don't think this is actually been repeated. The patient states his appetite is good and he is taking supplements 100%. 06/15/18-He is seen in follow up vibration for right buttock ulcer; this is stable in appearance with minimal amount of non-viable tissue. 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 We've been using a wound VAC. This is centered over the right ischial tuberosity. He is making generally good progress. He was severely hypoalbuminemic at one time although he states that he eats well and takes his supplements. 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 We have been using silver collagen over the superior aspect of the right ischial tuberosity under wound VAC.he has made some nice improvements. The dimensions of the wound are certainly smaller. He has no exposed bone. We received lab work from [facility]. His prealbumin was 18.6 and albumin at 2.57. This is obviously still low. I am not sure what interventions are in place. I know he is on supplements. I discussed this with the patient. I told him that the albumin represents already moderate to severe protein malnutrition. This would contribute it to do difficulty healing the wound 07/20/18; I received copies of lab work from [facility] on 07/06/18 documenting a sedimentation rate of 25 which seems to be in line with what I see  from previous values however his C-reactive protein was 110.8 versus 84.7 on 06/22/18. I have looked through [facility] health  Link I don't see a prior value for either inflammatory measure. he has been treated with [name] which I think he is still on for osteomyelitis of the right ischial tuberosity as well as a soft tissue collection just caudad to the right ischial tuberosity itself. Felt to have a soft tissue abscess and/or stigmata of necrotizing fasciitis. I'm not sure who is following this. His CT scan was on 05/09/18 yet he still remains on Invanz. Looking back through the original discharge summary the [name] was supposed to finish on 07/05/18. 08/03/18 the patient continues to make good improvement. The area over the right ischial tuberosity has filled in nicely. His developed some what looks to be candidal skin infection in the periwound. I think they've taken the wound VAC off and are HAKOP, KIRT. ([id number]) applying nystatin 08/17/18; 2 week follow-up for this resident from [facility]. They resumed the wound VAC. The area continues to fill in nicely. The only problem area is the tissue over the ischial tuberosity itself which needs to come up a bit. For this reason I'm continuing the wound VAC. Otherwise this is doing remarkably well. The patient states that he is eating everything they give him although I don't see a follow-up albumin. 08/31/18; the patient states he had blood work done today. I do not have these results. He claims to be eating well. He continues with a wound VAC and the wound is making nice progress. 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 This is coming quite nicely he still has one Onalee Hua that has some depth but I think by the next time we see him in 2 weeks we should be able to move on from the wound VAC. He is still at skilled care at [facility] place. As far as I am aware he is eating and drinking well. I  have not seen follow-up albumin studies on him recently but the message I'm getting is that he is eating well and taking his supplements. The patient states he is not very mobile and I am hopeful that we are able to insure pressure relief here however if we hadn't been ensuring pressure relief I doubt this would've closed in as well as it has even with the wound VAC. He does not complain of any systemic issues 09/28/2018 for follow-up and management of stage IV sacral ulcer that is being managed with a wound VAC. He is a resident at a long-term care facility, [facility]. The wound continues to have some depth and width some granulation of tissue on the outer borders of the wound. The wound was tender to touch today during exam. He denies any pain to the wound when it is not being touched. Recent fevers, chills, nausea, vomiting, or shortness of breath. Hopeful over the next couple of weeks he will be able to transition from the wound VAC. 10/19/2018; this patient has a stage IV over the right ischial tuberosity. The wound is come to the surface of the skin and looks very healthy at this point. His wound VAC can be discontinued. The patient apparently is eating well taking his supplements. He has no systemic complaints 11/23/2018. The patient has a stage IV wound over  his right ischial tuberosity that is now down to a quarter sized superficial area. There is no depth to this. The tissue looks healthy. No debridement was required. Continued improvement. We are using silver alginate and bordered foam 2/19; the original stage IV wound over his right ischial tuberosity. Continues to make nice progress in terms of dimensions. There is no depth to this. We have been using silver alginate and border foam 3/11; this is in follow-up of his original stage IV wound over his right ischial tuberosity. This continues to make nice progress using silver alginate and border foam vigorous offloading. He is at [facility] skilled facility. Patient complains of terrible pain in his hands and having trouble getting pain medication which he has to ask for as needed. He apparently has a history of rheumatoid arthritis and I note he is on prednisone and chloroquine. His rheumatologist is at [facility] clinic. 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 This continues to make decent progress is smaller. There is some undermining at roughly 12-3 o'clock. We have been using silver alginate 4/29; patient's wound continues to get smaller. The remaining area is epithelializing. The undermining area still is not epithelialized however. We have been using silver collagen since the last time he is here. He does not come any notes from the facility where he lives [facility]] but he claims to be eating well and vigorously offloading this area 5/20-Patient returns to clinic for right ischial wound. No notes available from facility, wound appears to be improving, we have been using Prisma and he has been offloading this area while using his wheelchair in fact he has started to use the walker again READMISSION 05/31/2019 This is a patient we cared for her for a prolonged period of time with a stage IV pressure area over his right ischio tuberosity. 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 We were able to get this to markedly improve with a wound VAC over time. We last saw this patient in clinic on 5/20 we are using silver collagen and a small wound that was just about closed. Per the patient this wound actually did progress to closure although I do not have any independent verification of this from [facility] where he lives. He states that this reopened about 2 to 3 months ago. His timeframe is off however again I do not have any independent verification. He has a small wound with a lip lip of tissue over the top of  this Electronic Signature(s) Signed: 05/31/2019 5:12:30 PM By: Baltazar Najjar MD Entered By: Baltazar Najjar on 05/31/2019 12:20:39 [name], [name] ([id number]) [place], Yavuz W. ([id number]) -------------------------------------------------------------------------------- Physical Exam Details Patient Name: HERBY, AMICK. Date of Service: 05/31/2019 9:45 AM Medical Record Number: [id number] Patient Account Number: [id number] Date of Birth/Sex: 09-04-1956 (63 y.o. M) Treating RN: Arnette Norris Primary Care Provider: Sindy Messing Other Clinician: Referring Provider: Bernette Redbird Treating Provider/Extender: Maxwell Caul Weeks in Treatment: 0 Constitutional Sitting or standing Blood Pressure is within target range for patient.. Pulse regular and within target range for patient.[name] Respirations regular, non-labored and within target range.. Temperature is normal and within the target range for the patient.[name] appears in no distress. Respiratory Respiratory effort is easy and symmetric bilaterally. Rate is normal at rest and on room air.. Bilateral breath sounds are clear and equal in all lobes with no wheezes,  rales or rhonchi.. Cardiovascular Heart rhythm and rate regular, without murmur or gallop.. Gastrointestinal (GI) No mass palpable.. Lymphatic . Integumentary (Hair, Skin) No infection around the wound. He has very significant seborrheic dermatitis in the scalp.. Notes Wound exam; the patient has a small wound with a lip of skin and subcutaneous tissue over the top. Properly exposing this shows that there is still considerable depth that this relatively although there is no exposed bone. There is no surrounding infection. Electronic Signature(s) Signed: 05/31/2019 5:12:30 PM By: Baltazar Najjarobson, Tyliek Timberman MD Entered By: Baltazar Najjarobson, Candyce Gambino on 05/31/2019 12:22:11 [name], Josedaniel W. ([id number]) -------------------------------------------------------------------------------- Physician  Orders Details Patient Name: [name], Gaylord W. Date of Service: 05/31/2019 9:45 AM Medical Record Number: [id number] Patient Account Number: [id number] Date of Birth/Sex: 1956/09/29 75(62 y.o. M) Treating RN: Arnette NorrisBiell, Kristina Primary Care Provider: Sindy MessingGOMEZ, ROGER Other Clinician: Referring Provider: Bernette RedbirdOSENTHAL, AMY Treating Provider/Extender: Altamese CarolinaOBSON, Roza Creamer G Weeks in Treatment: 0 Verbal / Phone Orders: No Diagnosis Coding Wound Cleansing Wound #2 Right Gluteus o Cleanse wound with mild soap and water Primary Wound Dressing Wound #2 Right Gluteus o Hydrafera Blue Ready Transfer Secondary Dressing Wound #2 Right Gluteus o Boardered Foam Dressing Dressing Change Frequency Wound #2 Right Gluteus o Change dressing every other day. Follow-up Appointments Wound #2 Right Gluteus o Return Appointment in 1 month Off-Loading Wound #2 Right Gluteus o Roho cushion for wheelchair o Turn and reposition every 2 hours Electronic Signature(s) Signed: 05/31/2019 4:53:39 PM By: Arnette NorrisBiell, Kristina Signed: 05/31/2019 5:12:30 PM By: Baltazar Najjarobson, Cayne Yom MD Entered By: Arnette NorrisBiell, Kristina on 05/31/2019 10:35:13 Arviso, Anden W. ([id number]) -------------------------------------------------------------------------------- Problem List Details Patient Name: [name], [name] Date of Service: 05/31/2019 9:45 AM Medical Record Number: [id number] Patient Account Number: [id number] Date of Birth/Sex: 1956/09/29 61(62 y.o. M) Treating RN: Arnette NorrisBiell, Kristina Primary Care Provider: Sindy MessingGOMEZ, ROGER Other Clinician: Referring Provider: Bernette RedbirdOSENTHAL, AMY Treating Provider/Extender: Altamese CarolinaOBSON, Dade Rodin G Weeks in Treatment: 0 Active Problems ICD-10 Evaluated Encounter Code Description Active Date Today Diagnosis L89.314 Pressure ulcer of right buttock, stage 4 05/31/2019 No Yes Inactive Problems Resolved Problems Electronic Signature(s) Signed: 05/31/2019 5:12:30 PM By: Baltazar Najjarobson, Rotha Cassels MD Entered By: Baltazar Najjarobson, Eran Windish on  05/31/2019 12:17:25 [name], [name] ([id number]) -------------------------------------------------------------------------------- Progress Note Details Patient Name: [name], [name] Date of Service: 05/31/2019 9:45 AM Medical Record Number: [id number] Patient Account Number: [id number] Date of Birth/Sex: 1956/09/29 77(62 y.o. M) Treating RN: Arnette NorrisBiell, Kristina Primary Care Provider: Sindy MessingGOMEZ, ROGER Other Clinician: Referring Provider: Bernette RedbirdOSENTHAL, AMY Treating Provider/Extender: Altamese CarolinaOBSON, Devontay Celaya G Weeks in Treatment: 0 Subjective History of Present Illness (HPI) ADMISSION 06/01/18 This is a 63 year old man who is sent to us currently residing at [facility] skilled facility. He has a complicated recent medical history. His sister accompanies him tells us that he has been somewhat disabled since suffering a left basal ganglial CVA in 2018. At that point in time he also had myocardial infarctions. More recently he has been hospitalized from 04/04/18 through 04/12/18 with septic shock, coronary artery disease, congestive heart failure and an acute CVA. During this hospitalization he was noted to have sacral wounds but these were not felt to be the source of infection however the source of the infection was never really determined at that point. I believe he was sent to a skilled facility. He reports that at the hospital on 05/09/18 through 05/18/18. At that point he was noted to have a large abscess of the right buttock. He required a surgical IandD by Dr. Gerrit FriendsGerkin of general surgery. Culture of this grew Citrobacter,Eikenella and Bacteroides. He had  a CT scan of the pelvis that showed an abnormal soft tissue collection just caudal to the right ischial tuberosity measuring 5.9 and by 4.7. This also involved the distal right deep muscles. There was some cortical bone loss on the right ischial tuberosity and there was some concern with osteomyelitis. He was felt to require treatment for osteomyelitis based on  clinical and radiographic findings. He did not have an MRI. Past medical history includes; abdominal aortic aneurysm, carotid stenosis, hypertension, osteoarthritis, interstitial lung disease on chronic steroids, rheumatoid arthritis, congestive heart failure/ischemic cardiomyopathy with an ejection fraction of 35-40% Also noteworthy is on 05/09/18 his albumin was 2.2 on admission to hospital. I don't think this is actually been repeated. The patient states his appetite is good and he is taking supplements 100%. 06/15/18-He is seen in follow up vibration for right buttock ulcer; this is stable in appearance with minimal amount of non-viable tissue. 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 We've been using a wound VAC. This is centered over the right ischial tuberosity. He is making generally good progress. He was severely hypoalbuminemic at one time although he states that he eats well and takes his supplements. 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 We have been using silver collagen over the superior aspect of the right ischial tuberosity under wound VAC.he has made some nice improvements. The dimensions of the wound are certainly smaller. He has no exposed bone. We received lab work from [facility]. His prealbumin was 18.6 and albumin at 2.57. This is obviously still low. I am not sure what interventions are in place. I know he is on supplements. I discussed this with the patient. I told him that the albumin represents already moderate to severe protein malnutrition. This would contribute it to do difficulty healing the wound 07/20/18; I received copies of lab work from [facility] on 07/06/18 documenting a sedimentation rate of 25 which seems to be in line with what I see from previous values however his  C-reactive protein was 110.8 versus 84.7 on 06/22/18. I have looked through [facility] health Link I don't see a prior value for either inflammatory measure. he has been treated with [name] which I think he is still on for osteomyelitis of the right ischial tuberosity as well as a soft tissue collection just caudad to the right ischial tuberosity itself. Felt to have a soft tissue abscess and/or stigmata of necrotizing fasciitis. I'm not sure who is following this. His CT scan was on 05/09/18 yet he still remains on Invanz. Looking back through the original discharge summary the [name] was supposed to finish on 07/05/18. 08/03/18 the patient continues to make good improvement. The area over the right ischial tuberosity has filled in nicely. His developed some what looks to be candidal skin infection in the periwound. I think they've taken the wound VAC off and are applying nystatin 08/17/18; 2 week follow-up for this resident from [facility]. They resumed the wound VAC. The area continues to fill in nicely. The only problem area is the tissue over the ischial tuberosity itself which needs to come up a bit. For this reason Sylis Ketchum, Merick W. ([id number]) continuing the wound VAC. Otherwise this is doing remarkably well. The patient states that he is eating everything they give him although I don't see a follow-up albumin. 08/31/18;  the patient states he had blood work done today. I do not have these results. He claims to be eating well. He continues with a wound VAC and the wound is making nice progress. 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 This is coming quite nicely he still has one Onalee Hua that has some depth but I think by the next time we see him in 2 weeks we should be able to move on from the wound VAC. He is still  at skilled care at [facility] place. As far as I am aware he is eating and drinking well. I have not seen follow-up albumin studies on him recently but the message I'm getting is that he is eating well and taking his supplements. The patient states he is not very mobile and I am hopeful that we are able to insure pressure relief here however if we hadn't been ensuring pressure relief I doubt this would've closed in as well as it has even with the wound VAC. He does not complain of any systemic issues 09/28/2018 for follow-up and management of stage IV sacral ulcer that is being managed with a wound VAC. He is a resident at a long-term care facility, [facility]. The wound continues to have some depth and width some granulation of tissue on the outer borders of the wound. The wound was tender to touch today during exam. He denies any pain to the wound when it is not being touched. Recent fevers, chills, nausea, vomiting, or shortness of breath. Hopeful over the next couple of weeks he will be able to transition from the wound VAC. 10/19/2018; this patient has a stage IV over the right ischial tuberosity. The wound is come to the surface of the skin and looks very healthy at this point. His wound VAC can be discontinued. The patient apparently is eating well taking his supplements. He has no systemic complaints 11/23/2018. The patient has a stage IV wound over his right ischial tuberosity that is now down to a quarter sized superficial area. There is no depth to this. The tissue looks healthy. No debridement was required. Continued improvement. We are using silver alginate and bordered foam 2/19; the original stage IV wound over his right ischial tuberosity. Continues to make nice progress in terms of dimensions. There is no depth to this. We have been using silver alginate and border foam 3/11; this is in follow-up of his original stage IV wound over his right ischial tuberosity. This continues to make  nice progress using silver alginate and border foam vigorous offloading. He is at [facility] skilled facility. Patient complains of terrible pain in his hands and having trouble getting pain medication which he has to ask for as needed. He apparently has a history of rheumatoid arthritis and I note he is on prednisone and chloroquine. His rheumatologist is at [facility] clinic. 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 This continues to make decent progress is smaller. There is some undermining at roughly 12-3 o'clock. We have been using silver alginate 4/29; patient's wound continues to get smaller. The remaining area is epithelializing. The undermining area still is not epithelialized however. We have been using silver collagen since the last time  he is here. He does not come any notes from the facility where he lives [facility] but he claims to be eating well and vigorously offloading this area 5/20-Patient returns to clinic for right ischial wound. No notes available from facility, wound appears to be improving, we have been using Prisma and he has been offloading this area while using his wheelchair in fact he has started to use the walker again READMISSION 05/31/2019 This is a patient we cared for her for a prolonged period of time with a stage IV pressure area over his right ischio tuberosity. 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 We were able to get this to markedly improve with a wound VAC over time. We last saw this patient in clinic on 5/20 we are using silver collagen and a small wound that was just about closed. Per the patient this wound actually did progress to closure although I do not have any independent verification of this from [facility] he lives. He states that this reopened about 2 to 3 months ago. His timeframe is off however again I do not have any  independent verification. He has a small wound with a lip lip of tissue over the top of this Patient History Information obtained from Patient. Allergies No Known Allergies [name], [name] ([id number]) Family History Heart Disease - Father, Hypertension - Father, No family history of Cancer, Diabetes, Hereditary Spherocytosis, Kidney Disease, Lung Disease, Seizures, Stroke, Thyroid Problems, Tuberculosis. Social History Former smoker - stopped Jan. 2018, Marital Status - Single, Alcohol Use - Rarely, Drug Use - No History, Caffeine Use - Daily. Medical History Eyes Patient has history of Cataracts Denies history of Glaucoma, Optic Neuritis Ear/Nose/Mouth/Throat Denies history of Chronic sinus problems/congestion, Middle ear problems Hematologic/Lymphatic Denies history of Anemia, Hemophilia, Human Immunodeficiency Virus, Lymphedema, Sickle Cell Disease Respiratory Denies history of Aspiration, Asthma, Chronic Obstructive Pulmonary Disease (COPD), Pneumothorax, Sleep Apnea, Tuberculosis Cardiovascular Patient has history of Congestive Heart Failure, Coronary Artery Disease, Hypertension, Myocardial Infarction - 11/2016 Denies history of Angina, Arrhythmia, Deep Vein Thrombosis, Hypotension, Peripheral Arterial Disease, Peripheral Venous Disease, Phlebitis, Vasculitis Gastrointestinal Denies history of Cirrhosis , Colitis, Crohn s, Hepatitis A, Hepatitis B, Hepatitis C Endocrine Denies history of Type I Diabetes, Type II Diabetes Genitourinary Denies history of End Stage Renal Disease Immunological Denies history of Lupus Erythematosus, Raynaud s, Scleroderma Integumentary (Skin) Patient has history of History of pressure wounds Denies history of History of Burn Musculoskeletal Patient has history of Rheumatoid Arthritis Denies history of Gout, Osteoarthritis, Osteomyelitis Neurologic Denies history of Dementia, Neuropathy, Quadriplegia, Paraplegia, Seizure  Disorder Oncologic Denies history of Received Chemotherapy, Received Radiation Psychiatric Denies history of Anorexia/bulimia, Confinement Anxiety Medical And Surgical History Notes Cardiovascular Nov 02 2016 had stroke and 5 heart attacks in same day Neurologic 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 Eyes Denies complaints or symptoms of Dry Eyes, Vision Changes, Glasses / Contacts. Ear/Nose/Mouth/Throat Denies complaints or symptoms of Difficult clearing ears, Sinusitis. Hematologic/Lymphatic Denies complaints or symptoms of Bleeding / Clotting Disorders, Human Immunodeficiency Virus. [name], [name] ([id number]) Respiratory Denies complaints or symptoms of Chronic or frequent coughs, Shortness of Breath. Cardiovascular Denies complaints or symptoms of Chest pain, LE edema. Gastrointestinal Denies complaints or symptoms of Frequent diarrhea, Nausea, Vomiting. Endocrine Denies complaints or symptoms of Hepatitis, Thyroid disease, Polydypsia (Excessive Thirst). Genitourinary Complains or has symptoms of Incontinence/dribbling. Denies complaints  or symptoms of Kidney failure/ Dialysis. Immunological Denies complaints or symptoms of Hives, Itching. Integumentary (Skin) Complains or has symptoms of Wounds, Breakdown. Denies complaints or symptoms of Bleeding or bruising tendency, Swelling. Musculoskeletal Denies complaints or symptoms of Muscle Pain, Muscle Weakness. Neurologic Denies complaints or symptoms of Numbness/parasthesias, Focal/Weakness. Psychiatric Denies complaints or symptoms of Anxiety, Claustrophobia. Objective Constitutional Sitting or standing Blood Pressure is within target range for patient.. Pulse regular and within target range for patient.[name] Respirations regular, non-labored and within target range.. Temperature is  normal and within the target range for the patient.[name] appears in no distress. Vitals Time Taken: 9:50 AM, Height: 73 in, Source: Stated, Weight: 155 lbs, Source: Stated, BMI: 20.4, Temperature: 98.2 F, Pulse: 110 bpm, Respiratory Rate: 18 breaths/min, Blood Pressure: 128/86 mmHg. Respiratory Respiratory effort is easy and symmetric bilaterally. Rate is normal at rest and on room air.. Bilateral breath sounds are clear and equal in all lobes with no wheezes, rales or rhonchi.. Cardiovascular Heart rhythm and rate regular, without murmur or gallop.. Gastrointestinal (GI) No mass palpable.. General Notes: Wound exam; the patient has a small wound with a lip of skin and subcutaneous tissue over the top. Properly exposing this shows that there is still considerable depth that this relatively although there is no exposed bone. There is no surrounding infection. Integumentary (Hair, Skin) No infection around the wound. He has very significant seborrheic dermatitis in the scalp.[name] [name], [name] ([id number]) Wound #2 status is Open. Original cause of wound was Pressure Injury. The wound is located on the Right Gluteus. The wound measures 1.2cm length x 0.5cm width x 0.2cm depth; 0.471cm^2 area and 0.094cm^3 volume. There is Fat Layer (Subcutaneous Tissue) Exposed exposed. There is no tunneling noted, however, there is undermining starting at 3:00 and ending at 9:00 with a maximum distance of 0.3cm. There is a medium amount of serous drainage noted. The wound margin is epibole. There is large (67-100%) pink granulation within the wound bed. There is a small (1-33%) amount of necrotic tissue within the wound bed including Adherent Slough. General Notes: this is the same area that was treated when patient was coming to clinic before and is a markedly improved stage 4 for that reason Assessment Active Problems ICD-10 Pressure ulcer of right buttock, stage 4 Plan Wound Cleansing: Wound #2 Right  Gluteus: Cleanse wound with mild soap and water Primary Wound Dressing: Wound #2 Right Gluteus: Hydrafera Blue Ready Transfer Secondary Dressing: Wound #2 Right Gluteus: Boardered Foam Dressing Dressing Change Frequency: Wound #2 Right Gluteus: Change dressing every other day. Follow-up Appointments: Wound #2 Right Gluteus: Return Appointment in 1 month Off-Loading: Wound #2 Right Gluteus: Roho cushion for wheelchair Turn and reposition every 2 hours 1. We continued with the RTD that the facility is using/Hydrofera Blue. A border foam dressing 2. If Hydrofera Blue was not working on this wound I would suggest endoform. 3. The patient claims to be offloading this well. He appears to be a lot more functional than he was at one point. 4. I will see him back in a month. At this point I do not think any further imaging studies were necessary Electronic Signature(s) [name], [name] ([id number]) Signed: 05/31/2019 5:12:30 PM By: Baltazar Najjar MD Entered By: Baltazar Najjar on 05/31/2019 12:23:17 Bader, [name] ([id number]) -------------------------------------------------------------------------------- ROS/PFSH Details Patient Name: [name], [name] Date of Service: 05/31/2019 9:45 AM Medical Record Number: [id number] Patient Account Number: [id number] Date of Birth/Sex: 1956/05/02 (63 y.o. M) Treating RN: Curtis Sites Primary Care  Provider: Domenica Fail Other Clinician: Referring Provider: Virgel Bouquet Treating Provider/Extender: Tito Dine in Treatment: 0 Information Obtained From Patient Constitutional Symptoms (General Health) Complaints and Symptoms: Negative for: Fatigue; Fever; Chills; Marked Weight Change Eyes Complaints and Symptoms: Negative for: Dry Eyes; Vision Changes; Glasses / Contacts Medical History: Positive for: Cataracts Negative for: Glaucoma; Optic Neuritis Ear/Nose/Mouth/Throat Complaints and Symptoms: Negative for: Difficult  clearing ears; Sinusitis Medical History: Negative for: Chronic sinus problems/congestion; Middle ear problems Hematologic/Lymphatic Complaints and Symptoms: Negative for: Bleeding / Clotting Disorders; Human Immunodeficiency Virus Medical History: Negative for: Anemia; Hemophilia; Human Immunodeficiency Virus; Lymphedema; Sickle Cell Disease Respiratory Complaints and Symptoms: Negative for: Chronic or frequent coughs; Shortness of Breath Medical History: Negative for: Aspiration; Asthma; Chronic Obstructive Pulmonary Disease (COPD); Pneumothorax; Sleep Apnea; Tuberculosis Cardiovascular Complaints and Symptoms: Negative for: Chest pain; LE edema Medical History: Positive for: Congestive Heart Failure; Coronary Artery Disease; Hypertension; Myocardial Infarction - 11/2016 Negative for: Angina; Arrhythmia; Deep Vein Thrombosis; Hypotension; Peripheral Arterial Disease; Peripheral Venous Disease; Phlebitis; Vasculitis [name], [name] ([id number]) Past Medical History Notes: Nov 02 2016 had stroke and 5 heart attacks in same day Gastrointestinal Complaints and Symptoms: Negative for: Frequent diarrhea; Nausea; Vomiting Medical History: Negative for: Cirrhosis ; Colitis; Crohnos; Hepatitis A; Hepatitis B; Hepatitis C Endocrine Complaints and Symptoms: Negative for: Hepatitis; Thyroid disease; Polydypsia (Excessive Thirst) Medical History: Negative for: Type I Diabetes; Type II Diabetes Genitourinary Complaints and Symptoms: Positive for: Incontinence/dribbling Negative for: Kidney failure/ Dialysis Medical History: Negative for: End Stage Renal Disease Immunological Complaints and Symptoms: Negative for: Hives; Itching Medical History: Negative for: Lupus Erythematosus; Raynaudos; Scleroderma Integumentary (Skin) Complaints and Symptoms: Positive for: Wounds; Breakdown Negative for: Bleeding or bruising tendency; Swelling Medical History: Positive for: History of  pressure wounds Negative for: History of Burn Musculoskeletal Complaints and Symptoms: Negative for: Muscle Pain; Muscle Weakness Medical History: Positive for: Rheumatoid Arthritis Negative for: Gout; Osteoarthritis; Osteomyelitis Neurologic Complaints and Symptoms: Negative for: Numbness/parasthesias; [place], [place] ([id number]) Medical History: Negative for: Dementia; Neuropathy; Quadriplegia; Paraplegia; Seizure Disorder Past Medical History Notes: 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 Psychiatric Complaints and Symptoms: Negative for: Anxiety; Claustrophobia Medical History: Negative for: Anorexia/bulimia; Confinement Anxiety Oncologic Medical History: Negative for: Received Chemotherapy; Received Radiation HBO Extended History Items Eyes: Cataracts Immunizations Pneumococcal Vaccine: Received Pneumococcal Vaccination: Yes Tetanus Vaccine: Last tetanus shot: 11/02/2016 Implantable Devices None Family and Social History Cancer: No; Diabetes: No; Heart Disease: Yes - Father; Hereditary Spherocytosis: No; Hypertension: Yes - Father; Kidney Disease: No; Lung Disease: No; Seizures: No; Stroke: No; Thyroid Problems: No; Tuberculosis: No; Former smoker - stopped Jan. 2018; Marital Status - Single; Alcohol Use: Rarely; Drug Use: No History; Caffeine Use: Daily; Financial Concerns: No; Food, Clothing or Shelter Needs: No; Support System Lacking: No; Transportation Concerns: No Electronic Signature(s) Signed: 05/31/2019 4:41:53 PM By: Montey Hora Signed: 05/31/2019 5:12:30 PM By: Linton Ham MD Entered By: Montey Hora on 05/31/2019 10:00:32 [name], ARNALL ([id number]) -------------------------------------------------------------------------------- [place] Details Patient Name: AVORY, MIMBS. Date of Service: 05/31/2019 Medical Record Number: [id number] Patient Account Number: [id number] Date of Birth/Sex: [birth year] (63 y.o. M) Treating RN:  Harold Barban Primary Care Provider: Domenica Fail Other Clinician: Referring Provider: Virgel Bouquet Treating Provider/Extender: Ricard Dillon Weeks in Treatment: 0 Diagnosis Coding ICD-10 Codes Code Description L89.314 Pressure ulcer of right buttock, stage 4 Facility Procedures CPT4 Code: [id number] Description: 99213 - WOUND CARE VISIT-LEV 3 EST PT Modifier: Quantity: 1 Physician Procedures CPT4 Code: [id number] Description: 86767 - WC PHYS LEVEL  3 - EST PT ICD-10 Diagnosis Description L89.314 Pressure ulcer of right buttock, stage 4 Modifier: Quantity: 1 Electronic Signature(s) Signed: 05/31/2019 5:12:30 PM By: Baltazar Najjar MD Entered By: Baltazar Najjar on 05/31/2019 12:23:32

## 2019-06-01 NOTE — Progress Notes (Signed)
[name], [name] ([id number]) Visit Report for 05/31/2019 Allergy List Details Patient Name: [name], [name] Date of Service: 05/31/2019 9:45 AM Medical Record Number: [id number] Patient Account Number: [id number] Date of Birth/Sex: [birth year](62 y.o. M) Treating RN: Curtis Sitesorthy, Joanna Primary Care Teosha Casso: Sindy MessingGOMEZ, ROGER Other Clinician: Referring Lalaine Overstreet: Bernette RedbirdOSENTHAL, AMY Treating Ryan [name] in Treatment: 0 Allergies Active Allergies No Known Allergies Allergy Notes Electronic Signature(s) Signed: 05/31/2019 4:41:53 PM By: Curtis Sitesorthy, Joanna Entered By: Curtis Sitesorthy, Joanna on 05/31/2019 09:57:09 [name], [name] ([id number]) -------------------------------------------------------------------------------- Arrival Information Details Patient Name: [name], [name] Date of Service: 05/31/2019 9:45 AM Medical Record Number: [id number] Patient Account Number: [id number] Date of Birth/Sex: [birth year] [age] y.o. M) Treating RN: Arnette NorrisBiell, Kristina Primary Care Dantavious Snowball: Sindy MessingGOMEZ, ROGER Other Clinician: Referring Taiwan Talcott: Bernette RedbirdOSENTHAL, AMY Treating Jaylianna Tatlock/Extender: Altamese CarolinaOBSON, Ryan G [name] in Treatment: 0 Visit Information Patient Arrived: Wheel Chair Arrival Time: 09:51 Accompanied By: self Transfer Assistance: Manual Patient Identification Verified: Yes Secondary Verification Process Completed: Yes History Since Last Visit Added or deleted any medications: No Any new allergies or adverse reactions: No Had a fall or experienced change in activities of daily living that may affect risk of falls: No Signs or symptoms of abuse/neglect since last visito No Hospitalized since last visit: No Implantable device outside of the clinic excluding cellular tissue based products placed in the center since last visit: No Has Dressing in Place as Prescribed: Yes Electronic Signature(s) Signed: 05/31/2019 4:24:39 PM By: Dayton MartesWallace, RCP,RRT,CHT, Sallie RCP, RRT, CHT Entered By: Dayton MartesWallace,  RCP,RRT,CHT, Sallie on 05/31/2019 09:52:38 [name], [name] ([id number]) -------------------------------------------------------------------------------- Clinic Level of Care Assessment Details Patient Name: Ryan CorralMOORE, [name] Date of Service: 05/31/2019 9:45 AM Medical Record Number: [id number] Patient Account Number: [id number] Date of Birth/Sex: [birth year] [age] y.o. M) Treating RN: Arnette NorrisBiell, Kristina Primary Care Jashiya Bassett: Sindy MessingGOMEZ, ROGER Other Clinician: Referring Hensley Treat: Bernette RedbirdOSENTHAL, AMY Treating Solymar Grace/Extender: Altamese CarolinaOBSON, Ryan G [name] in Treatment: 0 Clinic Level of Care Assessment Items TOOL 4 Quantity Score []  - Use when only an EandM is performed on FOLLOW-UP visit 0 ASSESSMENTS - Nursing Assessment / Reassessment X - Reassessment of Co-morbidities (includes updates in patient status) 1 10 X- 1 5 Reassessment of Adherence to Treatment Plan ASSESSMENTS - Wound and Skin Assessment / Reassessment X - Simple Wound Assessment / Reassessment - one wound 1 5 []  - 0 Complex Wound Assessment / Reassessment - multiple wounds []  - 0 Dermatologic / Skin Assessment (not related to wound area) ASSESSMENTS - Focused Assessment []  - Circumferential Edema Measurements - multi extremities 0 []  - 0 Nutritional Assessment / Counseling / Intervention []  - 0 Lower Extremity Assessment (monofilament, tuning fork, pulses) []  - 0 Peripheral Arterial Disease Assessment (using hand held doppler) ASSESSMENTS - Ostomy and/or Continence Assessment and Care []  - Incontinence Assessment and Management 0 []  - 0 Ostomy Care Assessment and Management (repouching, etc.) PROCESS - Coordination of Care X - Simple Patient / Family Education for ongoing care 1 15 []  - 0 Complex (extensive) Patient / Family Education for ongoing care []  - 0 Staff obtains ChiropractorConsents, Records, Test Results / Process Orders X- 1 10 Staff telephones HHA, Nursing Homes / Clarify orders / etc []  - 0 Routine Transfer to another  Facility (non-emergent condition) []  - 0 Routine Hospital Admission (non-emergent condition) []  - 0 New Admissions / Manufacturing engineernsurance Authorizations / Ordering NPWT, Apligraf, etc. []  - 0 Emergency Hospital Admission (emergent condition) X- 1 10 Simple Discharge Coordination [name], [name] ([id number]) []  - 0 Complex (extensive) Discharge Coordination PROCESS - Special  Needs []  - Pediatric / Minor Patient Management 0 []  - 0 Isolation Patient Management []  - 0 Hearing / Language / Visual special needs []  - 0 Assessment of Community assistance (transportation, D/C planning, etc.) []  - 0 Additional assistance / Altered mentation []  - 0 Support Surface(s) Assessment (bed, cushion, seat, etc.) INTERVENTIONS - Wound Cleansing / Measurement X - Simple Wound Cleansing - one wound 1 5 []  - 0 Complex Wound Cleansing - multiple wounds X- 1 5 Wound Imaging (photographs - any number of wounds) []  - 0 Wound Tracing (instead of photographs) X- 1 5 Simple Wound Measurement - one wound []  - 0 Complex Wound Measurement - multiple wounds INTERVENTIONS - Wound Dressings X - Small Wound Dressing one or multiple wounds 1 10 []  - 0 Medium Wound Dressing one or multiple wounds []  - 0 Large Wound Dressing one or multiple wounds []  - 0 Application of Medications - topical []  - 0 Application of Medications - injection INTERVENTIONS - Miscellaneous []  - External ear exam 0 []  - 0 Specimen Collection (cultures, biopsies, blood, body fluids, etc.) []  - 0 Specimen(s) / Culture(s) sent or taken to Lab for analysis []  - 0 Patient Transfer (multiple staff / Nurse, adult / Similar devices) []  - 0 Simple Staple / Suture removal (25 or less) []  - 0 Complex Staple / Suture removal (26 or more) []  - 0 Hypo / Hyperglycemic Management (close monitor of Blood Glucose) []  - 0 Ankle / Brachial Index (ABI) - do not check if billed separately X- 1 5 Vital Signs [name], [name] ([id number]) Has the  patient been seen at the hospital within the last three years: Yes Total Score: 85 Level Of Care: New/Established - Level 3 Electronic Signature(s) Signed: 05/31/2019 4:53:39 PM By: Arnette Norris Entered By: Arnette Norris on 05/31/2019 10:32:10 Cesaro, [name] ([id number]) -------------------------------------------------------------------------------- Encounter Discharge Information Details Patient Name: Ryan Carrow. Date of Service: 05/31/2019 9:45 AM Medical Record Number: [id number] Patient Account Number: [id number] Date of Birth/Sex: October 25, 1956 (63 y.o. M) Treating RN: Rodell Perna Primary Care Indea Dearman: Sindy Messing Other Clinician: Referring Sherod Cisse: Bernette Redbird Treating Fahed Morten/Extender: Altamese Puckett in Treatment: 0 Encounter Discharge Information Items Discharge Condition: Stable Ambulatory Status: Wheelchair Discharge Destination: Home Transportation: Private Auto Accompanied By: self Schedule Follow-up Appointment: Yes Clinical Summary of Care: Electronic Signature(s) Signed: 05/31/2019 4:31:27 PM By: Rodell Perna Entered By: Rodell Perna on 05/31/2019 10:38:46 [name], [name] ([id number]) -------------------------------------------------------------------------------- Lower Extremity Assessment Details Patient Name: Ryan Carrow. Date of Service: 05/31/2019 9:45 AM Medical Record Number: [id number] Patient Account Number: [id number] Date of Birth/Sex: 1956/02/07 (63 y.o. M) Treating RN: Curtis Sites Primary Care Hazel Wrinkle: Sindy Messing Other Clinician: Referring Thanya Cegielski: Bernette Redbird Treating Xariah Silvernail/Extender: Maxwell Caul [name] in Treatment: 0 Electronic Signature(s) Signed: 05/31/2019 4:41:53 PM By: Curtis Sites Entered By: Curtis Sites on 05/31/2019 09:57:01 Hogg, Delano W. ([id number]) -------------------------------------------------------------------------------- Multi Wound Chart Details Patient Name: Ryan Carrow. Date of Service: 05/31/2019 9:45 AM Medical Record Number: [id number] Patient Account Number: [id number] Date of Birth/Sex: Nov 25, 1955 (63 y.o. M) Treating RN: Arnette Norris Primary Care Jimia Gentles: Sindy Messing Other Clinician: Referring Mattia Osterman: Bernette Redbird Treating Jazz Rogala/Extender: Altamese [place] in Treatment: 0 Vital Signs Height(in): 73 Pulse(bpm): 110 Weight(lbs): 155 Blood Pressure(mmHg): 128/86 Body Mass Index(BMI): 20 Temperature(F): 98.2 Respiratory Rate 18 (breaths/min): Photos: [N/A:N/A] Wound Location: Right Gluteus N/A N/A Wounding Event: Pressure Injury N/A N/A Primary Etiology: Pressure Ulcer N/A N/A Comorbid History: Cataracts, Congestive Heart N/A N/A Failure, Coronary Artery  Disease, Hypertension, Myocardial Infarction, History of pressure wounds, Rheumatoid Arthritis Date Acquired: 05/03/2019 N/A N/A [name] of Treatment: 0 N/A N/A Wound Status: Open N/A N/A Measurements L x W x D 1.2x0.5x0.2 N/A N/A (cm) Area (cm) : 0.471 N/A N/A Volume (cm) : 0.094 N/A N/A % Reduction in Area: 0.00% N/A N/A % Reduction in Volume: 0.00% N/A N/A Starting Position 1 3 (o'clock): Ending Position 1 9 (o'clock): Maximum Distance 1 (cm): 0.3 Undermining: Yes N/A N/A Classification: Category/Stage IV N/A N/A Exudate Amount: Medium N/A N/A Exudate Type: Serous N/A N/A Exudate Color: amber N/A N/A Wound Margin: Epibole N/A N/A TYDARIUS, YAWN. 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 Date of Service: 05/31/2019 9:45 AM Medical Record Number: [id number] Patient Account Number: [id number] Date of Birth/Sex: [birth year] (63 y.o. M) Treating RN: Harold Barban Primary Care Teshia Mahone: Domenica Fail Other Clinician: Referring Lucrezia Dehne: Virgel Bouquet Treating Mariea Mcmartin/Extender: Tito Dine in Treatment: 0 Active Inactive Pressure Nursing Diagnoses: Knowledge deficit related to causes and risk factors for pressure ulcer development Knowledge deficit related to management of pressures ulcers Goals: Patient/caregiver will verbalize risk factors for pressure ulcer development Date Initiated: 05/31/2019 Target Resolution Date: 07/01/2019 Goal Status: Active Interventions: Assess: immobility, friction, shearing, incontinence upon admission and as needed Assess offloading mechanisms upon admission and as needed Assess potential for pressure ulcer upon admission and as needed Provide education on pressure ulcers Notes: Wound/Skin Impairment Nursing Diagnoses: Impaired tissue integrity Goals: Ulcer/skin breakdown will have a volume reduction of 30% by week 4 Date Initiated: 05/31/2019 Target Resolution Date: 07/01/2019 Goal Status: Active Interventions: Assess patient/caregiver ability to perform ulcer/skin care regimen upon admission and as needed Assess ulceration(s) every visit Notes: Electronic Signature(s) Signed: 05/31/2019 4:53:39 PM By: Harold Barban Entered By: Harold Barban on 05/31/2019 10:30:30 Diona Fanti  ([id number]) -------------------------------------------------------------------------------- Pain Assessment Details Patient Name: Diona Fanti. Date of Service: 05/31/2019 9:45 AM Medical Record Number: [id number] Patient Account Number: [id number] Date of Birth/Sex: 1956/01/31 (63 y.o. M) Treating RN: Harold Barban Primary Care Tahja Liao: Domenica Fail Other Clinician: Referring Kasean Denherder: Virgel Bouquet Treating Clara Herbison/Extender: Tito Dine in Treatment: 0 Active Problems Location of Pain Severity and Description of Pain Patient Has Paino Yes Site Locations Rate the pain. Current Pain Level: 10 Pain Management and Medication Current Pain Management: Electronic Signature(s) Signed: 05/31/2019 4:24:39 PM By: Lorine Bears RCP, RRT, CHT Signed: 05/31/2019 4:53:39 PM By: Harold Barban Entered By: Lorine Bears on 05/31/2019 09:52:48 Betancur, Lysle Viona Gilmore ([id number]) -------------------------------------------------------------------------------- Patient/Caregiver Education Details Patient Name: Diona Fanti. Date of Service: 05/31/2019 9:45 AM Medical Record Number: [id number] Patient Account Number: [id number] Date of Birth/Gender: 04-Nov-1955 (62 y.o. M) Treating RN: Harold Barban Primary Care Physician: Domenica Fail Other Clinician: Referring Physician: Virgel Bouquet Treating Physician/Extender: Tito Dine in Treatment: 0 Education Assessment Education Provided To: Patient Education Topics Provided Pressure:  Handouts: Pressure Ulcers: Care and Offloading Methods: Demonstration, Explain/Verbal Responses: State content correctly Wound/Skin Impairment: Handouts: Caring for Your Ulcer Methods: Demonstration, Explain/Verbal Responses: State content correctly Electronic Signature(s) Signed: 05/31/2019 4:53:39 PM By: Arnette Norris Entered By: Arnette Norris on 05/31/2019 10:31:32 Befort, Dontrez W.  ([id number]) -------------------------------------------------------------------------------- Wound Assessment Details Patient Name: Ryan Carrow. Date of Service: 05/31/2019 9:45 AM Medical Record Number: [id number] Patient Account Number: [id number] Date of Birth/Sex: [birth year] (63 y.o. M) Treating RN: Curtis Sites Primary Care Farrell Pantaleo: Sindy Messing Other Clinician: Referring Ottis Vacha: Bernette Redbird Treating Thelbert Gartin/Extender: Maxwell Caul [name] in Treatment: 0 Wound Status Wound Number: 2 Primary Pressure Ulcer Etiology: Wound Location: Right Gluteus Wound Open Wounding Event: Pressure Injury Status: Date Acquired: 05/03/2019 Comorbid Cataracts, Congestive Heart Failure, Coronary [name] Of Treatment: 0 History: Artery Disease, Hypertension, Myocardial Clustered Wound: No Infarction, History of pressure wounds, Rheumatoid Arthritis Photos Wound Measurements Length: (cm) 1.2 % Reduction Width: (cm) 0.5 % Reduction Depth: (cm) 0.2 Epithelializ Area: (cm) 0.471 Tunneling: Volume: (cm) 0.094 Undermining Starting Ending Po Maximum D in Area: 0% in Volume: 0% ation: Small (1-33%) No : Yes Position (o'clock): 3 sition (o'clock): 9 istance: (cm) 0.3 Wound Description Classification: Category/Stage IV Foul Odor Af Wound Margin: Epibole Slough/Fibri Exudate Amount: Medium Exudate Type: Serous Exudate Color: amber ter Cleansing: No no No Wound Bed Granulation Amount: Large (67-100%) Exposed Structure Granulation Quality: Pink Fascia Exposed: No Necrotic Amount: Small (1-33%) Fat Layer (Subcutaneous Tissue) Exposed: Yes Necrotic Quality: Adherent Slough Tendon Exposed: No Muscle Exposed: No Boulter, Muzammil W. ([id number]) Joint Exposed: No Bone Exposed: No Assessment Notes this is the same area that was treated when patient was coming to clinic before and is a markedly improved stage 4 for that reason Treatment Notes Wound #2 (Right  Gluteus) Notes hydrofera packed in tunnel, BFD Electronic Signature(s) Signed: 05/31/2019 11:54:12 AM By: Arnette Norris Signed: 05/31/2019 4:41:53 PM By: Curtis Sites Previous Signature: 05/31/2019 10:30:01 AM Version By: Curtis Sites Entered By: Arnette Norris on 05/31/2019 11:54:12 [place], Kyriakos W. ([id number]) -------------------------------------------------------------------------------- Vitals Details Patient Name: COLUCCI, [name]. Date of Service: 05/31/2019 9:45 AM Medical Record Number: [id number] Patient Account Number: [id number] Date of Birth/Sex: 05/23/1956 (63 y.o. M) Treating RN: Arnette Norris Primary Care Elzie Knisley: Sindy Messing Other Clinician: Referring Avila Albritton: Bernette Redbird Treating Aasiya Creasey/Extender: Altamese Napoleon in Treatment: 0 Vital Signs Time Taken: 09:50 Temperature (F): 98.2 Height (in): 73 Pulse (bpm): 110 Source: Stated Respiratory Rate (breaths/min): 18 Weight (lbs): 155 Blood Pressure (mmHg): 128/86 Source: Stated Reference Range: 80 - 120 mg / dl Body Mass Index (BMI): 20.4 Electronic Signature(s) Signed: 05/31/2019 4:24:39 PM By: Dayton Martes RCP, RRT, CHT Entered By: Dayton Martes on 05/31/2019 09:53:33

## 2019-06-28 ENCOUNTER — Other Ambulatory Visit: Payer: Self-pay

## 2019-06-28 ENCOUNTER — Encounter: Payer: Medicaid Other | Attending: Internal Medicine | Admitting: Internal Medicine

## 2019-06-28 DIAGNOSIS — J849 Interstitial pulmonary disease, unspecified: Secondary | ICD-10-CM | POA: Diagnosis not present

## 2019-06-28 DIAGNOSIS — I252 Old myocardial infarction: Secondary | ICD-10-CM | POA: Diagnosis not present

## 2019-06-28 DIAGNOSIS — Z7952 Long term (current) use of systemic steroids: Secondary | ICD-10-CM | POA: Insufficient documentation

## 2019-06-28 DIAGNOSIS — Z8673 Personal history of transient ischemic attack (TIA), and cerebral infarction without residual deficits: Secondary | ICD-10-CM | POA: Diagnosis not present

## 2019-06-28 DIAGNOSIS — L89314 Pressure ulcer of right buttock, stage 4: Secondary | ICD-10-CM | POA: Insufficient documentation

## 2019-06-28 DIAGNOSIS — I110 Hypertensive heart disease with heart failure: Secondary | ICD-10-CM | POA: Diagnosis not present

## 2019-06-28 DIAGNOSIS — I255 Ischemic cardiomyopathy: Secondary | ICD-10-CM | POA: Insufficient documentation

## 2019-06-28 DIAGNOSIS — I509 Heart failure, unspecified: Secondary | ICD-10-CM | POA: Diagnosis not present

## 2019-06-28 DIAGNOSIS — M069 Rheumatoid arthritis, unspecified: Secondary | ICD-10-CM | POA: Insufficient documentation

## 2019-06-28 NOTE — Progress Notes (Signed)
KELSON, QUEENAN ([id number]) Visit Report for 06/28/2019 Arrival Information Details Patient Name: [name] Date of Service: 06/28/2019 10:15 AM Medical Record Number: [id number] Patient Account Number: [id number] Date of Birth/Sex: 02/18/1956 (63 y.o. M) Treating RN: Army Melia Primary Care Gaither Biehn: Domenica Fail Other Clinician: Referring Nicholl Onstott: Domenica Fail Treating Tyrica Afzal/Extender: Tito Dine in Treatment: 4 Visit Information History Since Last Visit Added or deleted any medications: No Patient Arrived: Wheel Chair Any new allergies or adverse reactions: No Arrival Time: 10:17 Had a fall or experienced change in No Accompanied By: self activities of daily living that may affect Transfer Assistance: Harrel Lemon Lift risk of falls: Signs or symptoms of abuse/neglect since last visito No Hospitalized since last visit: No Has Dressing in Place as Prescribed: Yes Pain Present Now: No Electronic Signature(s) Signed: 06/28/2019 11:16:42 AM By: Army Melia Entered By: Army Melia on 06/28/2019 10:18:01 Scheidegger, Holley Dexter ([id number]) -------------------------------------------------------------------------------- Encounter Discharge Information Details Patient Name: [name] Date of Service: 06/28/2019 10:15 AM Medical Record Number: [id number] Patient Account Number: [id number] Date of Birth/Sex: 03-02-1956 (63 y.o. M) Treating RN: Montey Hora Primary Care Leaann Nevils: Domenica Fail Other Clinician: Referring Folashade Gamboa: Domenica Fail Treating Vardaan Depascale/Extender: Tito Dine in Treatment: 4 Encounter Discharge Information Items Discharge Condition: Stable Ambulatory Status: Wheelchair Discharge Destination: Skilled Nursing Facility Telephoned: No Orders Sent: Yes Transportation: Private Auto Accompanied By: self Schedule Follow-up Appointment: Yes Clinical Summary of Care: Electronic Signature(s) Signed: 06/28/2019 11:01:18 AM By:  Montey Hora Entered By: Montey Hora on 06/28/2019 11:01:18 Nordgren, Holley Dexter ([id number]) -------------------------------------------------------------------------------- Lower Extremity Assessment Details Patient Name: [name] Date of Service: 06/28/2019 10:15 AM Medical Record Number: [id number] Patient Account Number: [id number] Date of Birth/Sex: 01/25/1956 (63 y.o. M) Treating RN: Army Melia Primary Care Caleah Tortorelli: Domenica Fail Other Clinician: Referring Tyneka Scafidi: Domenica Fail Treating Raenette Sakata/Extender: Ricard Dillon Weeks in Treatment: 4 Electronic Signature(s) Signed: 06/28/2019 11:16:42 AM By: Army Melia Entered By: Army Melia on 06/28/2019 10:23:14 [name], [name] ([id number]) -------------------------------------------------------------------------------- Multi Wound Chart Details Patient Name: [name] Date of Service: 06/28/2019 10:15 AM Medical Record Number: [id number] Patient Account Number: [id number] Date of Birth/Sex: 09-17-1956 (63 y.o. M) Treating RN: Harold Barban Primary Care Chade Pitner: Domenica Fail Other Clinician: Referring Pricilla Moehle: Domenica Fail Treating Ziyana Morikawa/Extender: Tito Dine in Treatment: 4 Vital Signs Height(in): 73 Pulse(bpm): 110 Weight(lbs): 155 Blood Pressure(mmHg): 127/91 Body Mass Index(BMI): 20 Temperature(F): 97.8 Respiratory Rate 16 (breaths/min): Photos: [N/A:N/A] Wound Location: Right Gluteus N/A N/A Wounding Event: Pressure Injury N/A N/A Primary Etiology: Pressure Ulcer N/A N/A Comorbid History: Cataracts, Congestive Heart N/A N/A Failure, Coronary Artery Disease, Hypertension, Myocardial Infarction, History of pressure wounds, Rheumatoid Arthritis Date Acquired: 05/03/2019 N/A N/A Weeks of Treatment: 4 N/A N/A Wound Status: Open N/A N/A Measurements L x W x D 0.6x0.8x0.3 N/A N/A (cm) Area (cm) : 0.377 N/A N/A Volume (cm) : 0.113 N/A N/A % Reduction in Area: 20.00% N/A  N/A % Reduction in Volume: -20.20% N/A N/A Classification: Category/Stage IV N/A N/A Exudate Amount: Medium N/A N/A Exudate Type: Serous N/A N/A Exudate Color: amber N/A N/A Wound Margin: Epibole N/A N/A Granulation Amount: Large (67-100%) N/A N/A Granulation Quality: Pink N/A N/A Necrotic Amount: Small (1-33%) N/A N/A Exposed Structures: Fat Layer (Subcutaneous N/A N/A Tissue) Exposed: Yes Fascia: No [name], [name] ([id number]) Tendon: No Muscle: No Joint: No Bone: No Epithelialization: Small (1-33%) N/A N/A Procedures Performed: CHEM CAUT GRANULATION N/A N/A TISS Treatment Notes Electronic Signature(s) Signed: 06/28/2019 4:56:01 PM By: Dellia Nims,  Casimiro Needle MD Entered By: Baltazar Najjar on 06/28/2019 10:43:47 Hargreaves, [name] ([id number]) -------------------------------------------------------------------------------- Multi-Disciplinary Care Plan Details Patient Name: [name] Date of Service: 06/28/2019 10:15 AM Medical Record Number: [id number] Patient Account Number: [id number] Date of Birth/Sex: 1956/03/26 (63 y.o. M) Treating RN: Arnette Norris Primary Care Marqual Mi: Sindy Messing Other Clinician: Referring Leland Staszewski: Sindy Messing Treating Karyme Mcconathy/Extender: Altamese [place] in Treatment: 4 Active Inactive Pressure Nursing Diagnoses: Knowledge deficit related to causes and risk factors for pressure ulcer development Knowledge deficit related to management of pressures ulcers Goals: Patient/caregiver will verbalize risk factors for pressure ulcer development Date Initiated: 05/31/2019 Target Resolution Date: 07/01/2019 Goal Status: Active Interventions: Assess: immobility, friction, shearing, incontinence upon admission and as needed Assess offloading mechanisms upon admission and as needed Assess potential for pressure ulcer upon admission and as needed Provide education on pressure ulcers Notes: Wound/Skin Impairment Nursing Diagnoses: Impaired  tissue integrity Goals: Ulcer/skin breakdown will have a volume reduction of 30% by week 4 Date Initiated: 05/31/2019 Target Resolution Date: 07/01/2019 Goal Status: Active Interventions: Assess patient/caregiver ability to perform ulcer/skin care regimen upon admission and as needed Assess ulceration(s) every visit Notes: Electronic Signature(s) Signed: 06/28/2019 4:23:12 PM By: Arnette Norris Entered By: Arnette Norris on 06/28/2019 10:38:37 [name], [name] ([id number]) -------------------------------------------------------------------------------- Pain Assessment Details Patient Name: [name] Date of Service: 06/28/2019 10:15 AM Medical Record Number: [id number] Patient Account Number: [id number] Date of Birth/Sex: [birth year] (63 y.o. M) Treating RN: Rodell Perna Primary Care Clytee Heinrich: Sindy Messing Other Clinician: Referring Debi Cousin: Sindy Messing Treating Ciara Kagan/Extender: Maxwell Caul Weeks in Treatment: 4 Active Problems Location of Pain Severity and Description of Pain Patient Has Paino No Site Locations Pain Management and Medication Current Pain Management: Electronic Signature(s) Signed: 06/28/2019 11:16:42 AM By: Rodell Perna Entered By: Rodell Perna on 06/28/2019 10:18:07 Privitera, [name] ([id number]) -------------------------------------------------------------------------------- Patient/Caregiver Education Details Patient Name: [name] Date of Service: 06/28/2019 10:15 AM Medical Record Number: [id number] Patient Account Number: [id number] Date of Birth/Gender: 02-Sep-1956 (63 y.o. M) Treating RN: Arnette Norris Primary Care Physician: Sindy Messing Other Clinician: Referring Physician: Sindy Messing Treating Physician/Extender: Altamese [place] in Treatment: 4 Education Assessment Education Provided To: Patient Education Topics Provided Pressure: Handouts: Pressure Ulcers: Care and Offloading Methods: Demonstration,  Explain/Verbal Responses: State content correctly Wound/Skin Impairment: Handouts: Caring for Your Ulcer Methods: Demonstration, Explain/Verbal Responses: State content correctly Electronic Signature(s) Signed: 06/28/2019 4:23:12 PM By: Arnette Norris Entered By: Arnette Norris on 06/28/2019 10:39:31 [name], [name] ([id number]) -------------------------------------------------------------------------------- Wound Assessment Details Patient Name: [name] Date of Service: 06/28/2019 10:15 AM Medical Record Number: [id number] Patient Account Number: [id number] Date of Birth/Sex: 1956/07/17 (63 y.o. M) Treating RN: Rodell Perna Primary Care Christine Schiefelbein: Sindy Messing Other Clinician: Referring Hamzeh Tall: Sindy Messing Treating Samel Bruna/Extender: Maxwell Caul Weeks in Treatment: 4 Wound Status Wound Number: 2 Primary Pressure Ulcer Etiology: Wound Location: Right Gluteus Wound Open Wounding Event: Pressure Injury Status: Date Acquired: 05/03/2019 Comorbid Cataracts, Congestive Heart Failure, Coronary Weeks Of Treatment: 4 History: Artery Disease, Hypertension, Myocardial Clustered Wound: No Infarction, History of pressure wounds, Rheumatoid Arthritis Photos Wound Measurements Length: (cm) 0.6 Width: (cm) 0.8 Depth: (cm) 0.3 Area: (cm) 0.377 Volume: (cm) 0.113 % Reduction in Area: 20% % Reduction in Volume: -20.2% Epithelialization: Small (1-33%) Tunneling: No Undermining: No Wound Description Classification: Category/Stage IV Foul Odor Af Wound Margin: Epibole Slough/Fibri Exudate Amount: Medium Exudate Type: Serous Exudate Color: amber ter Cleansing: No no No Wound Bed Granulation Amount: Large (67-100%) Exposed Structure Granulation Quality:  Pink Fascia Exposed: No Necrotic Amount: Small (1-33%) Fat Layer (Subcutaneous Tissue) Exposed: Yes Necrotic Quality: Adherent Slough Tendon Exposed: No Muscle Exposed: No Joint Exposed: No Bone Exposed:  No [name], [name] ([id number]) Treatment Notes Wound #2 (Right Gluteus) Notes hydrofera packed in tunnel, BFD Electronic Signature(s) Signed: 06/28/2019 11:16:42 AM By: Rodell Perna Entered By: Rodell Perna on 06/28/2019 10:22:55 [name], [name] ([id number]) -------------------------------------------------------------------------------- Vitals Details Patient Name: [name] Date of Service: 06/28/2019 10:15 AM Medical Record Number: [id number] Patient Account Number: [id number] Date of Birth/Sex: 1956/09/30 (63 y.o. M) Treating RN: Rodell Perna Primary Care Aalaysia Liggins: Sindy Messing Other Clinician: Referring Michale Weikel: Sindy Messing Treating Muhamad Serano/Extender: Altamese  [place] in Treatment: 4 Vital Signs Time Taken: 10:18 Temperature (F): 97.8 Height (in): 73 Pulse (bpm): 110 Weight (lbs): 155 Respiratory Rate (breaths/min): 16 Body Mass Index (BMI): 20.4 Blood Pressure (mmHg): 127/91 Reference Range: 80 - 120 mg / dl Electronic Signature(s) Signed: 06/28/2019 11:16:42 AM By: Rodell Perna Entered By: Rodell Perna on 06/28/2019 10:18:49

## 2019-06-28 NOTE — Progress Notes (Addendum)
[name], Rush W. ([id number]) Visit Report for 06/28/2019 HPI Details Patient Name: [name], Delane W. Date of Service: 06/28/2019 10:15 AM Medical Record Number: [id number] Patient Account Number: [id number] Date of Birth/Sex: [birth year](62 y.o. M) Treating RN: Arnette NorrisBiell, Kristina Primary Care Provider: Sindy MessingGOMEZ, ROGER Other Clinician: Referring Provider: Sindy MessingGOMEZ, ROGER Treating Provider/Extender: Altamese CarolinaOBSON, Kobi Mario G Weeks in Treatment: 4 History of Present Illness HPI Description: ADMISSION 06/01/18 This is a 63 year old man who is sent to us currently residing at [facility] skilled facility. He has a complicated recent medical history. His sister accompanies him tells us that he has been somewhat disabled since suffering a left basal ganglial CVA in 2018. At that point in time he also had myocardial infarctions. More recently he has been hospitalized from 04/04/18 through 04/12/18 with septic shock, coronary artery disease, congestive heart failure and an acute CVA. During this hospitalization he was noted to have sacral wounds but these were not felt to be the source of infection however the source of the infection was never really determined at that point. I believe he was sent to a skilled facility. He reports that at the hospital on 05/09/18 through 05/18/18. At that point he was noted to have a large abscess of the right buttock. He required a surgical IandD by Dr. Gerrit FriendsGerkin of general surgery. Culture of this grew Citrobacter,Eikenella and Bacteroides. He had a CT scan of the pelvis that showed an abnormal soft tissue collection just caudal to the right ischial tuberosity measuring 5.9 and by 4.7. This also involved the distal right deep muscles. There was some cortical bone loss on the right ischial tuberosity and there was some concern with osteomyelitis. He was felt to require treatment for osteomyelitis based on clinical and radiographic findings. He did not have an MRI. Past medical history  includes; abdominal aortic aneurysm, carotid stenosis, hypertension, osteoarthritis, interstitial lung disease on chronic steroids, rheumatoid arthritis, congestive heart failure/ischemic cardiomyopathy with an ejection fraction of 35-40% Also noteworthy is on 05/09/18 his albumin was 2.2 on admission to hospital. I don't think this is actually been repeated. The patient states his appetite is good and he is taking supplements 100%. 06/15/18-He is seen in follow up vibration for right buttock ulcer; this is stable in appearance with minimal amount of non-viable tissue. 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 We've been using a wound VAC. This is centered over the right ischial tuberosity. He is making generally good progress. He was severely hypoalbuminemic at one time although he states that he eats well and takes his supplements. 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 We have been using silver collagen over the superior aspect of the right ischial tuberosity under wound VAC.he has made some nice improvements. The dimensions of the wound are certainly smaller. He has no exposed bone. We received lab work from [facility]. His prealbumin was 18.6 and albumin at 2.57. This is obviously still low. I am not sure what interventions are in place. I know he is on supplements. I discussed this with the patient. I told him that the albumin represents already moderate to severe protein malnutrition. This would contribute it to do difficulty healing the wound 07/20/18; I received copies of lab work from [facility] on 07/06/18 documenting a sedimentation rate of 25 which seems to be in line with what I see  from previous values however his C-reactive protein was 110.8 versus 84.7 on 06/22/18. I have looked through [facility] health  Link I don't see a prior value for either inflammatory measure. he has been treated with [name] which I think he is still on for osteomyelitis of the right ischial tuberosity as well as a soft tissue collection just caudad to the right ischial tuberosity itself. Felt to have a soft tissue abscess and/or stigmata of necrotizing fasciitis. I'm not sure who is following this. His CT scan was on 05/09/18 yet he still remains on Invanz. Looking back through the original discharge summary the [name] was supposed to finish on 07/05/18. 08/03/18 the patient continues to make good improvement. The area over the right ischial tuberosity has filled in nicely. His developed some what looks to be candidal skin infection in the periwound. I think they've taken the wound VAC off and are HAKOP, KIRT. ([id number]) applying nystatin 08/17/18; 2 week follow-up for this resident from [facility]. They resumed the wound VAC. The area continues to fill in nicely. The only problem area is the tissue over the ischial tuberosity itself which needs to come up a bit. For this reason I'm continuing the wound VAC. Otherwise this is doing remarkably well. The patient states that he is eating everything they give him although I don't see a follow-up albumin. 08/31/18; the patient states he had blood work done today. I do not have these results. He claims to be eating well. He continues with a wound VAC and the wound is making nice progress. 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 This is coming quite nicely he still has one Onalee Hua that has some depth but I think by the next time we see him in 2 weeks we should be able to move on from the wound VAC. He is still at skilled care at [facility] place. As far as I am aware he is eating and drinking well. I  have not seen follow-up albumin studies on him recently but the message I'm getting is that he is eating well and taking his supplements. The patient states he is not very mobile and I am hopeful that we are able to insure pressure relief here however if we hadn't been ensuring pressure relief I doubt this would've closed in as well as it has even with the wound VAC. He does not complain of any systemic issues 09/28/2018 for follow-up and management of stage IV sacral ulcer that is being managed with a wound VAC. He is a resident at a long-term care facility, [facility]. The wound continues to have some depth and width some granulation of tissue on the outer borders of the wound. The wound was tender to touch today during exam. He denies any pain to the wound when it is not being touched. Recent fevers, chills, nausea, vomiting, or shortness of breath. Hopeful over the next couple of weeks he will be able to transition from the wound VAC. 10/19/2018; this patient has a stage IV over the right ischial tuberosity. The wound is come to the surface of the skin and looks very healthy at this point. His wound VAC can be discontinued. The patient apparently is eating well taking his supplements. He has no systemic complaints 11/23/2018. The patient has a stage IV wound over  his right ischial tuberosity that is now down to a quarter sized superficial area. There is no depth to this. The tissue looks healthy. No debridement was required. Continued improvement. We are using silver alginate and bordered foam 2/19; the original stage IV wound over his right ischial tuberosity. Continues to make nice progress in terms of dimensions. There is no depth to this. We have been using silver alginate and border foam 3/11; this is in follow-up of his original stage IV wound over his right ischial tuberosity. This continues to make nice progress using silver alginate and border foam vigorous offloading. He is at [facility] skilled facility. Patient complains of terrible pain in his hands and having trouble getting pain medication which he has to ask for as needed. He apparently has a history of rheumatoid arthritis and I note he is on prednisone and chloroquine. His rheumatologist is at [facility] clinic. 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 This continues to make decent progress is smaller. There is some undermining at roughly 12-3 o'clock. We have been using silver alginate 4/29; patient's wound continues to get smaller. The remaining area is epithelializing. The undermining area still is not epithelialized however. We have been using silver collagen since the last time he is here. He does not come any notes from the facility where he lives [facility]] but he claims to be eating well and vigorously offloading this area 5/20-Patient returns to clinic for right ischial wound. No notes available from facility, wound appears to be improving, we have been using Prisma and he has been offloading this area while using his wheelchair in fact he has started to use the walker again READMISSION 05/31/2019 This is a patient we cared for her for a prolonged period of time with a stage IV pressure area over his right ischio tuberosity. 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 We were able to get this to markedly improve with a wound VAC over time. We last saw this patient in clinic on 5/20 we are using silver collagen and a small wound that was just about closed. Per the patient this wound actually did progress to closure although I do not have any independent verification of this from [facility] where he lives. He states that this reopened about 2 to 3 months ago. His timeframe is off however again I do not have any independent verification. He has a small wound with a lip lip of tissue over the top of  this 8/26; wound is down to a very tiny open area about slight hyper granulation. The area is healing with epithelialization and a bit of a divot. I used silver nitrate. Continue with Hydrofera Blue Electronic Signature(s) Signed: 06/30/2019 9:26:29 AM By: Elliot Gurney, BSN, RN, CWS, Kim RN, BSN [address] [place]. ([id number]) Signed: 06/30/2019 5:26:07 PM By: Baltazar Najjar MD Previous Signature: 06/29/2019 3:47:20 PM Version By: Elliot Gurney BSN, RN, CWS, Kim RN, BSN Previous Signature: 06/28/2019 4:56:01 PM Version By: Baltazar Najjar MD Entered By: Elliot Gurney, BSN, RN, CWS, Kim on 06/30/2019 40:98:11 TERIN, CRAGLE ([id number]) -------------------------------------------------------------------------------- Gaynelle Adu TISS Details Patient Name: NICHALAS, COIN. Date of Service: 06/28/2019 10:15 AM Medical Record Number: [id number] Patient Account Number: [id number] Date of Birth/Sex: [birth year] (63 y.o. M) Treating RN: Arnette Norris Primary Care Provider: Sindy Messing Other Clinician: Referring Provider: Sindy Messing Treating Provider/Extender: Altamese Norwich  in Treatment: 4 Procedure Performed for: Wound #2 Right Gluteus Performed By: Physician Maxwell CaulOBSON, Will Heinkel G, MD Post Procedure Diagnosis Same as Pre-procedure Electronic Signature(s) Signed: 06/28/2019 4:56:01 PM By: Baltazar Najjarobson, Susann Lawhorne MD Entered By: Baltazar Najjarobson, Alfreda Hammad on 06/28/2019 10:43:58 Donnell, Loreli SlotPHILLIP W. ([id number]) -------------------------------------------------------------------------------- Physical Exam Details Patient Name: [name], Michial W. Date of Service: 06/28/2019 10:15 AM Medical Record Number: [id number] Patient Account Number: [id number] Date of Birth/Sex: [birth year] 3(62 y.o. M) Treating RN: Arnette NorrisBiell, Kristina Primary Care Provider: Sindy MessingGOMEZ, ROGER Other Clinician: Referring Provider: Sindy MessingGOMEZ, ROGER Treating Provider/Extender: Altamese CarolinaOBSON, Jayson Waterhouse G Weeks in Treatment: 4 Constitutional Patient is hypertensive.. Pulse  regular and within target range for patient.[name]. Respirations regular, non-labored and within target range.. Temperature is normal and within the target range for the patient.[name]. appears in no distress. Cardiovascular . Notes Wound exam; small wound with a slight amount of hyper granulation. Using a a single silver nitrate I knocked back the hyper granulation. This brought this down to the level of the surrounding skin. There is a divot here that seems to be epithelializing. Should be healed by the next time we see him Electronic Signature(s) Signed: 06/28/2019 4:56:01 PM By: Baltazar Najjarobson, Zavian Slowey MD Entered By: Baltazar Najjarobson, Eloise Mula on 06/28/2019 10:46:40 Hymon, Loreli SlotPHILLIP W. ([id number]) -------------------------------------------------------------------------------- Physician Orders Details Patient Name: [name], Yona W. Date of Service: 06/28/2019 10:15 AM Medical Record Number: [id number] Patient Account Number: [id number] Date of Birth/Sex: [birth year] 42(62 y.o. M) Treating RN: Arnette NorrisBiell, Kristina Primary Care Provider: Sindy MessingGOMEZ, ROGER Other Clinician: Referring Provider: Sindy MessingGOMEZ, ROGER Treating Provider/Extender: Altamese CarolinaOBSON, Edwing Figley G Weeks in Treatment: 4 Verbal / Phone Orders: No Diagnosis Coding Wound Cleansing Wound #2 Right Gluteus o Cleanse wound with mild soap and water Primary Wound Dressing Wound #2 Right Gluteus o Hydrafera Blue Ready Transfer Secondary Dressing Wound #2 Right Gluteus o Boardered Foam Dressing Dressing Change Frequency Wound #2 Right Gluteus o Change dressing every other day. Follow-up Appointments Wound #2 Right Gluteus o Return Appointment in 1 month Off-Loading Wound #2 Right Gluteus o Roho cushion for wheelchair o Turn and reposition every 2 hours Electronic Signature(s) Signed: 06/28/2019 4:23:12 PM By: Arnette NorrisBiell, Kristina Signed: 06/28/2019 4:56:01 PM By: Baltazar Najjarobson, Asia Dusenbury MD Entered By: Arnette NorrisBiell, Kristina on 06/28/2019 10:40:07 Lingelbach, Loreli SlotPHILLIP W.  ([id number]) -------------------------------------------------------------------------------- Problem List Details Patient Name: [name], Arshawn W. Date of Service: 06/28/2019 10:15 AM Medical Record Number: [id number] Patient Account Number: [id number] Date of Birth/Sex: [birth year] 3(62 y.o. M) Treating RN: Arnette NorrisBiell, Kristina Primary Care Provider: Sindy MessingGOMEZ, ROGER Other Clinician: Referring Provider: Sindy MessingGOMEZ, ROGER Treating Provider/Extender: Altamese CarolinaOBSON, Sharlon Pfohl G Weeks in Treatment: 4 Active Problems ICD-10 Evaluated Encounter Code Description Active Date Today Diagnosis L89.314 Pressure ulcer of right buttock, stage 4 05/31/2019 No Yes Inactive Problems Resolved Problems Electronic Signature(s) Signed: 06/28/2019 4:56:01 PM By: Baltazar Najjarobson, Calogero Geisen MD Entered By: Baltazar Najjarobson, Tomisha Reppucci on 06/28/2019 10:43:40 Keesey, Delia W. ([id number]) -------------------------------------------------------------------------------- Progress Note Details Patient Name: [name], Chimaobi W. Date of Service: 06/28/2019 10:15 AM Medical Record Number: [id number] Patient Account Number: [id number] Date of Birth/Sex: [birth year](62 y.o. M) Treating RN: Arnette NorrisBiell, Kristina Primary Care Provider: Sindy MessingGOMEZ, ROGER Other Clinician: Referring Provider: Sindy MessingGOMEZ, ROGER Treating Provider/Extender: Altamese CarolinaOBSON, Casady Voshell G Weeks in Treatment: 4 Subjective History of Present Illness (HPI) ADMISSION 06/01/18 This is a 63 year old man who is sent to us currently residing at [facility] skilled facility. He has a complicated recent medical history. His sister accompanies him tells us that he has been somewhat disabled since suffering a left basal ganglial CVA in 2018. At that point in time he also had myocardial infarctions. More recently he  has been hospitalized from 04/04/18 through 04/12/18 with septic shock, coronary artery disease, congestive heart failure and an acute CVA. During this hospitalization he was noted to have sacral wounds but these were not  felt to be the source of infection however the source of the infection was never really determined at that point. I believe he was sent to a skilled facility. He reports that at the hospital on 05/09/18 through 05/18/18. At that point he was noted to have a large abscess of the right buttock. He required a surgical IandD by Dr. Gerrit Friends of general surgery. Culture of this grew Citrobacter,Eikenella and Bacteroides. He had a CT scan of the pelvis that showed an abnormal soft tissue collection just caudal to the right ischial tuberosity measuring 5.9 and by 4.7. This also involved the distal right deep muscles. There was some cortical bone loss on the right ischial tuberosity and there was some concern with osteomyelitis. He was felt to require treatment for osteomyelitis based on clinical and radiographic findings. He did not have an MRI. Past medical history includes; abdominal aortic aneurysm, carotid stenosis, hypertension, osteoarthritis, interstitial lung disease on chronic steroids, rheumatoid arthritis, congestive heart failure/ischemic cardiomyopathy with an ejection fraction of 35-40% Also noteworthy is on 05/09/18 his albumin was 2.2 on admission to hospital. I don't think this is actually been repeated. The patient states his appetite is good and he is taking supplements 100%. 06/15/18-He is seen in follow up vibration for right buttock ulcer; this is stable in appearance with minimal amount of non-viable tissue. 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 We've been using a wound VAC. This is centered over the right ischial tuberosity. He is making generally good progress. He was severely hypoalbuminemic at one time although he states that he eats well and takes his supplements. 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 We have been using silver collagen over the superior aspect of the right ischial tuberosity under wound VAC.he has made some nice improvements. The dimensions of the wound are certainly smaller. He has no exposed bone. We received lab work from [facility]. His prealbumin was 18.6 and albumin at 2.57. This is obviously still low. I am not sure what interventions are in place. I know he is on supplements. I discussed this with the patient. I told him that the albumin represents already moderate to severe protein malnutrition. This would contribute it to do difficulty healing the wound 07/20/18; I received copies of lab work from [facility] on 07/06/18 documenting a sedimentation rate of 25 which seems to be in line with what I see from previous values however his C-reactive protein was 110.8 versus 84.7 on 06/22/18. I have looked through [facility] health Link I don't see a prior value for either inflammatory measure. he has been treated with [name] which I think he is still on for osteomyelitis of the right ischial tuberosity as well as a soft tissue collection just caudad to the right ischial tuberosity itself. Felt to have a soft tissue abscess and/or stigmata of necrotizing fasciitis. I'm not sure who is following this. His CT scan was on 05/09/18 yet he still remains on Invanz. Looking back through the original discharge summary the [name] was supposed to finish on 07/05/18. 08/03/18 the patient continues to make good improvement. The area over the right ischial  tuberosity has filled in nicely. His developed some what looks to be candidal skin infection in the periwound. I think they've taken the wound VAC off and are applying nystatin 08/17/18; 2 week follow-up for this resident from [facility]. They resumed the wound VAC. The area continues to fill in nicely. The only problem area is the tissue over the ischial tuberosity itself which needs to come up a bit. For this reason Silver Parkey, Tylerjames W.  ([id number]) continuing the wound VAC. Otherwise this is doing remarkably well. The patient states that he is eating everything they give him although I don't see a follow-up albumin. 08/31/18; the patient states he had blood work done today. I do not have these results. He claims to be eating well. He continues with a wound VAC and the wound is making nice progress. 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 This is coming quite nicely he still has one Onalee Hua that has some depth but I think by the next time we see him in 2 weeks we should be able to move on from the wound VAC. He is still at skilled care at [facility] place. As far as I am aware he is eating and drinking well. I have not seen follow-up albumin studies on him recently but the message I'm getting is that he is eating well and taking his supplements. The patient states he is not very mobile and I am hopeful that we are able to insure pressure relief here however if we hadn't been ensuring pressure relief I doubt this would've closed in as well as it has even with the wound VAC. He does not complain of any systemic issues 09/28/2018 for follow-up and management of stage IV sacral ulcer that is being managed with a wound VAC. He is a resident at a long-term care facility, [facility]. The wound continues to have some depth and width some granulation of tissue on the outer borders of the wound. The wound was tender to touch today during exam. He denies any pain to the wound when it is not being touched. Recent fevers, chills, nausea, vomiting, or shortness of breath. Hopeful over the next couple of weeks he will be able to transition from the wound VAC. 10/19/2018; this patient has a stage IV over the right ischial tuberosity. The wound is come to the surface of  the skin and looks very healthy at this point. His wound VAC can be discontinued. The patient apparently is eating well taking his supplements. He has no systemic complaints 11/23/2018. The patient has a stage IV wound over his right ischial tuberosity that is now down to a quarter sized superficial area. There is no depth to this. The tissue looks healthy. No debridement was required. Continued improvement. We are using silver alginate and bordered foam 2/19; the original stage IV wound over his right ischial tuberosity. Continues to make nice progress in terms of dimensions. There is no depth to this. We have been using silver alginate and border foam 3/11; this is in follow-up of his original stage IV wound over his right ischial tuberosity. This continues to make nice progress using silver alginate and border foam vigorous offloading. He is at [facility] skilled facility. Patient complains of terrible pain in his hands and having trouble getting pain medication which  he has to ask for as needed. He apparently has a history of rheumatoid arthritis and I note he is on prednisone and chloroquine. His rheumatologist is at [facility] clinic. 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 This continues to make decent progress is smaller. There is some undermining at roughly 12-3 o'clock. We have been using silver alginate 4/29; patient's wound continues to get smaller. The remaining area is epithelializing. The undermining area still is not epithelialized however. We have been using silver collagen since the last time he is here. He does not come any notes from the facility where he lives [facility]] but he claims to be eating well and vigorously offloading this area 5/20-Patient returns to clinic for right ischial wound. 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 We were able to get this to markedly improve with a wound VAC over time. We last saw this patient in clinic on 5/20 we are using silver collagen and a small wound that was just about closed. Per the patient this wound actually did progress to closure although I do not have any independent verification of this from [facility] where he lives. He states that this reopened about 2 to 3 months ago. His timeframe is off however again I do not have any independent verification. He has a small wound with a lip lip of tissue over the top of this 8/26; wound is down to a very tiny open area about slight hyper granulation. The area is healing with epithelialization and a bit of a divot. I used silver nitrate. Continue with [employer], Johsua W. ([id number]) Objective Constitutional Patient is hypertensive.. Pulse regular and within target range for patient.[name] Respirations regular, non-labored and within target range.. Temperature is normal and within the target range for the patient.[name] appears in no distress. Vitals Time Taken: 10:18 AM, Height: 73 in, Weight: 155 lbs, BMI: 20.4, Temperature: 97.8 F, Pulse: 110 bpm, Respiratory Rate: 16 breaths/min, Blood Pressure: 127/91 mmHg. General Notes: Wound exam; small wound with a slight amount of hyper granulation. Using a a single silver nitrate I knocked back the hyper granulation. This brought this down to the level of the surrounding skin. There is a divot here that seems to be epithelializing. Should be healed by the next time we see him Integumentary (Hair, Skin) Wound #2 status is Open. Original cause of wound was  Pressure Injury. The wound is located on the Right Gluteus. The wound measures 0.6cm length x 0.8cm width x 0.3cm depth; 0.377cm^2 area and 0.113cm^3 volume. There is Fat Layer (Subcutaneous Tissue) Exposed exposed. There is no tunneling or undermining noted. There is a medium amount of serous drainage noted. The wound margin is epibole. There is large (67-100%) pink granulation within the wound bed. There is a small (1-33%) amount of necrotic tissue within the wound bed including Adherent Slough. Assessment Active Problems ICD-10 Pressure ulcer of right buttock, stage 4 Procedures Wound #2 Pre-procedure diagnosis of Wound #2 is a Pressure Ulcer located on the  Right Gluteus . An CHEM CAUT GRANULATION TISS procedure was performed by Ricard Dillon, MD. Post procedure Diagnosis Wound #2: Same as Pre-Procedure Plan Wound Cleansing: Wound #2 Right Gluteus: Cleanse wound with mild soap and water Dicola, Mike W. ([id number]) Primary Wound Dressing: Wound #2 Right Gluteus: Hydrafera Blue Ready Transfer Secondary Dressing: Wound #2 Right Gluteus: Boardered Foam Dressing Dressing Change Frequency: Wound #2 Right Gluteus: Change dressing every other day. Follow-up Appointments: Wound #2 Right Gluteus: Return Appointment in 1 month Off-Loading: Wound #2 Right Gluteus: Roho cushion for wheelchair Turn and reposition every 2 hours 1. Very tiny wound area left of an original large stage IV wound 2. Continue with the RTD/Hydrofera Blue ready border foam dressing 3. Patient counseled to remember to keep the pressure off this even after healing. There is nothing but skin over bone in this area Electronic Signature(s) Signed: 06/30/2019 9:26:47 AM By: Gretta Cool, BSN, RN, CWS, Kim RN, BSN Signed: 06/30/2019 5:26:07 PM By: Linton Ham MD Previous Signature: 06/29/2019 3:47:49 PM Version By: Gretta Cool BSN, RN, CWS, Kim RN, BSN Previous Signature: 06/28/2019 4:56:01 PM Version By: Linton Ham  MD Entered By: Gretta Cool, BSN, RN, CWS, Kim on 06/30/2019 09:26:46 ESPINA, Holley Dexter ([id number]) -------------------------------------------------------------------------------- [place] Details Patient Name: PRIMO, INNIS. Date of Service: 06/28/2019 Medical Record Number: [id number] Patient Account Number: [id number] Date of Birth/Sex: 1956-09-18 (63 y.o. M) Treating RN: Harold Barban Primary Care Provider: Domenica Fail Other Clinician: Referring Provider: Domenica Fail Treating Provider/Extender: Ricard Dillon Weeks in Treatment: 4 Diagnosis Coding ICD-10 Codes Code Description L89.314 Pressure ulcer of right buttock, stage 4 Facility Procedures CPT4 Code: [id number] Description: 49449 - CHEM CAUT GRANULATION TISS ICD-10 Diagnosis Description L89.314 Pressure ulcer of right buttock, stage 4 Modifier: Quantity: 1 Physician Procedures CPT4 Code: [id number] Description: 84665 - WC PHYS CHEM CAUT GRAN TISSUE ICD-10 Diagnosis Description L89.314 Pressure ulcer of right buttock, stage 4 Modifier: Quantity: 1 Electronic Signature(s) Signed: 06/28/2019 4:56:01 PM By: Linton Ham MD Entered By: Linton Ham on 06/28/2019 10:47:37

## 2019-07-26 ENCOUNTER — Other Ambulatory Visit: Payer: Self-pay

## 2019-07-26 ENCOUNTER — Encounter: Payer: Medicaid Other | Attending: Internal Medicine | Admitting: Internal Medicine

## 2019-07-26 DIAGNOSIS — Z8673 Personal history of transient ischemic attack (TIA), and cerebral infarction without residual deficits: Secondary | ICD-10-CM | POA: Diagnosis not present

## 2019-07-26 DIAGNOSIS — M069 Rheumatoid arthritis, unspecified: Secondary | ICD-10-CM | POA: Diagnosis not present

## 2019-07-26 DIAGNOSIS — I110 Hypertensive heart disease with heart failure: Secondary | ICD-10-CM | POA: Insufficient documentation

## 2019-07-26 DIAGNOSIS — Z7952 Long term (current) use of systemic steroids: Secondary | ICD-10-CM | POA: Diagnosis not present

## 2019-07-26 DIAGNOSIS — I2510 Atherosclerotic heart disease of native coronary artery without angina pectoris: Secondary | ICD-10-CM | POA: Insufficient documentation

## 2019-07-26 DIAGNOSIS — E43 Unspecified severe protein-calorie malnutrition: Secondary | ICD-10-CM | POA: Diagnosis not present

## 2019-07-26 DIAGNOSIS — I252 Old myocardial infarction: Secondary | ICD-10-CM | POA: Insufficient documentation

## 2019-07-26 DIAGNOSIS — I255 Ischemic cardiomyopathy: Secondary | ICD-10-CM | POA: Diagnosis not present

## 2019-07-26 DIAGNOSIS — L89314 Pressure ulcer of right buttock, stage 4: Secondary | ICD-10-CM | POA: Diagnosis not present

## 2019-07-26 DIAGNOSIS — J849 Interstitial pulmonary disease, unspecified: Secondary | ICD-10-CM | POA: Insufficient documentation

## 2019-07-26 DIAGNOSIS — I509 Heart failure, unspecified: Secondary | ICD-10-CM | POA: Insufficient documentation

## 2019-07-26 DIAGNOSIS — M8580 Other specified disorders of bone density and structure, unspecified site: Secondary | ICD-10-CM | POA: Insufficient documentation

## 2019-07-27 NOTE — Progress Notes (Signed)
Ryles, Montray W. ([id number]) Visit Report for 07/26/2019 Debridement Details Patient Name: [name], [name] Date of Service: 07/26/2019 10:15 AM Medical Record Number: [id number] Patient Account Number: [id number] Date of Birth/Sex: [birth year](62 y.o. M) Treating RN: Huel CoventryWVerne [name], [name] Primary Care Provider: Sindy MessingGOMEZ, ROGER Other Clinician: Referring Provider: Sindy MessingGOMEZ, ROGER Treating Provider/Extender: Altamese CarolinaOBSON, MICHAEL G Weeks in Treatment: 8 Debridement Performed for Wound #2 Right Gluteus Assessment: Performed By: Physician Maxwell CaulOBSON, MICHAEL G, MD Debridement Type: Debridement Level of Consciousness (Pre- Awake and Alert procedure): Pre-procedure Verification/Time Yes - 10:39 Out Taken: Start Time: 10:39 Pain Control: Lidocaine Total Area Debrided (L x W): 0.6 (cm) x 0.3 (cm) = 0.18 (cm) Tissue and other material Subcutaneous, Skin: Dermis debrided: Level: Skin/Subcutaneous Tissue Debridement Description: Excisional Instrument: Forceps Bleeding: Minimum Hemostasis Achieved: Pressure End Time: 10:40 Response to Treatment: Procedure was tolerated well Level of Consciousness Awake and Alert (Post-procedure): Post Debridement Measurements of Total Wound Length: (cm) 1.9 Stage: Category/Stage IV Width: (cm) 1 Depth: (cm) 0.6 Volume: (cm) 0.895 Character of Wound/Ulcer Post Requires Further Debridement Debridement: Post Procedure Diagnosis Same as Pre-procedure Electronic Signature(s) Signed: 07/26/2019 5:27:47 PM By: Baltazar Najjarobson, Michael MD Signed: 07/26/2019 5:53:54 PM By: Elliot GurneyWoody, BSN, RN, CWS, Kim RN, BSN Entered By: Elliot GurneyWoody, BSN, RN, CWS, [name] on 07/26/2019 10:42:47 Fandino, [name] ([id number]) -------------------------------------------------------------------------------- HPI Details Patient Name: [name], [name] Date of Service: 07/26/2019 10:15 AM Medical Record Number: [id number] Patient Account Number: [id number] Date of Birth/Sex: [birth year](62 y.o. M) Treating RN: Huel CoventryWoody,  [name] Primary Care Provider: Sindy MessingGOMEZ, ROGER Other Clinician: Referring Provider: Sindy MessingGOMEZ, ROGER Treating Provider/Extender: Altamese CarolinaOBSON, MICHAEL G Weeks in Treatment: 8 History of Present Illness HPI Description: ADMISSION 06/01/18 This is a 63 year old man who is sent to us currently [facility]. He has a complicated recent medical history. His sister accompanies him tells us that he has been somewhat disabled since suffering a left basal ganglial CVA in 2018. At that point in time he also had myocardial infarctions. More recently he has been hospitalized from 04/04/18 through 04/12/18 with septic shock, coronary artery disease, congestive heart failure and an acute CVA. During this hospitalization he was noted to have sacral wounds but these were not felt to be the source of infection however the source of the infection was never really determined at that point. I believe he was sent to a skilled facility. He reports that at the hospital on 05/09/18 through 05/18/18. At that point he was noted to have a large abscess of the right buttock. He required a surgical IandD by Dr. Gerrit FriendsGerkin of general surgery. Culture of this grew Citrobacter,Eikenella and Bacteroides. He had a CT scan of the pelvis that showed an abnormal soft tissue collection just caudal to the right ischial tuberosity measuring 5.9 and by 4.7. This also involved the distal right deep muscles. There was some cortical bone loss on the right ischial tuberosity and there was some concern with osteomyelitis. He was felt to require treatment for osteomyelitis based on clinical and radiographic findings. He did not have an MRI. Past medical history includes; abdominal aortic aneurysm, carotid stenosis, hypertension, osteoarthritis, interstitial lung disease on chronic steroids, rheumatoid arthritis, congestive heart failure/ischemic cardiomyopathy with an ejection fraction of 35-40% Also noteworthy is on 05/09/18 his albumin  was 2.2 on admission to hospital. I don't think this is actually been repeated. The patient states his appetite is good and he is taking supplements 100%. 06/15/18-He is seen in follow up vibration for right buttock ulcer; this is stable in  appearance with minimal amount of non-viable tissue. 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 We've been using a wound VAC. This is centered over the right ischial tuberosity. He is making generally good progress. He was severely hypoalbuminemic at one time although he states that he eats well and takes his supplements. 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 We have been using silver collagen over the superior aspect of the right ischial tuberosity under wound VAC.he has made some nice improvements. The dimensions of the wound are certainly smaller. He has no exposed bone. We received lab work from [facility]. His prealbumin was 18.6 and albumin at 2.57. This is obviously still low. I am not sure what interventions are in place. I know he is on supplements. I discussed this with the patient. I told him that the albumin represents already moderate to severe protein malnutrition. This would contribute it to do difficulty healing the wound 07/20/18; I received copies of lab work from [facility] on 07/06/18 documenting a sedimentation rate of 25 which seems to be in line with what I see from previous values however his C-reactive protein was 110.8 versus 84.7 on 06/22/18. I have looked through [place] I don't see a prior value for either inflammatory measure. he has been treated with [name] which I think he is still on for osteomyelitis of the right ischial tuberosity as well as a soft tissue collection just caudad to the right ischial tuberosity itself. Felt to have a  soft tissue abscess and/or stigmata of necrotizing fasciitis. I'm not sure who is following this. His CT scan was on 05/09/18 yet he still remains on Invanz. Looking back through the original discharge summary the [name] was supposed to finish on 07/05/18. 08/03/18 the patient continues to make good improvement. The area over the right ischial tuberosity has filled in nicely. His developed some what looks to be candidal skin infection in the periwound. I think they've taken the wound VAC off and are applying nystatin 08/17/18; 2 week follow-up for this resident from [facility]. They resumed the wound VAC. The area continues to fill in nicely. The only problem area is the tissue over the ischial tuberosity itself which needs to come up a bit. For this reason I'm continuing the wound VAC. Otherwise this is doing remarkably well. The patient states that he is eating everything they give JERIMIE, MANCUSO. ([id number]) him although I don't see a follow-up albumin. 08/31/18; the patient states he had blood work done today. I do not have these results. He claims to be eating well. He continues with a wound VAC and the wound is making nice progress. 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 This is coming quite nicely he still has one Shanon Brow that has some depth but I think by the next time we see him in 2 weeks we should be able to move on from the wound VAC. He is still at skilled care at [facility] place. As far as I am aware he is eating and drinking well. I have not seen follow-up albumin studies on him recently but the message I'm getting is that he  is eating well and taking his supplements. The patient states he is not very mobile and I am hopeful that we are able to insure pressure relief here however if we hadn't been  ensuring pressure relief I doubt this would've closed in as well as it has even with the wound VAC. He does not complain of any systemic issues 09/28/2018 for follow-up and management of stage IV sacral ulcer that is being managed with a wound VAC. He is a resident at a long-term care facility, [facility]. The wound continues to have some depth and width some granulation of tissue on the outer borders of the wound. The wound was tender to touch today during exam. He denies any pain to the wound when it is not being touched. Recent fevers, chills, nausea, vomiting, or shortness of breath. Hopeful over the next couple of weeks he will be able to transition from the wound VAC. 10/19/2018; this patient has a stage IV over the right ischial tuberosity. The wound is come to the surface of the skin and looks very healthy at this point. His wound VAC can be discontinued. The patient apparently is eating well taking his supplements. He has no systemic complaints 11/23/2018. The patient has a stage IV wound over his right ischial tuberosity that is now down to a quarter sized superficial area. There is no depth to this. The tissue looks healthy. No debridement was required. Continued improvement. We are using silver alginate and bordered foam 2/19; the original stage IV wound over his right ischial tuberosity. Continues to make nice progress in terms of dimensions. There is no depth to this. We have been using silver alginate and border foam 3/11; this is in follow-up of his original stage IV wound over his right ischial tuberosity. This continues to make nice progress using silver alginate and border foam vigorous offloading. He is at [facility] skilled facility. Patient complains of terrible pain in his hands and having trouble getting pain medication which he has to ask for as needed. He apparently has a history of rheumatoid arthritis and I note he is on prednisone and chloroquine. His rheumatologist  is at [facility] clinic. 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 This continues to make decent progress is smaller. There is some undermining at roughly 12-3 o'clock. We have been using silver alginate 4/29; patient's wound continues to get smaller. The remaining area is epithelializing. The undermining area still is not epithelialized however. We have been using silver collagen since the last time he is here. He does not come any notes from the facility where he lives [facility]] but he claims to be eating well and vigorously offloading this area 5/20-Patient returns to clinic for right ischial wound. No notes available from facility, wound appears to be improving, we have been using Prisma and he has been offloading this area while using his wheelchair in fact he has started to use the walker again READMISSION 05/31/2019 This is a patient we cared for her for a prolonged period of time with a stage IV pressure area over his right ischio tuberosity. 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 We were able to get this to markedly improve with a wound VAC over time. We last saw this patient in clinic on 5/20 we are using silver collagen and a small wound that was just about closed. Per the  patient this wound actually did progress to closure although I do not have any independent verification of this from [facility] where he lives. He states that this reopened about 2 to 3 months ago. His timeframe is off however again I do not have any independent verification. He has a small wound with a lip lip of tissue over the top of this 8/26; wound is down to a very tiny open area about slight hyper granulation. The area is healing with epithelialization and a bit of a divot. I used silver nitrate. Continue with Hydrofera Blue 9/23; patient arrives today with the wound quite a bit worse. There is  a lip of skin and subcutaneous tissue over the majority of the visible part of this wound however this is fairly deep underneath this with exposed bone. At one point he had a large stage IV wound and this with underlying osteomyelitis. We have been using Hydrofera Blue. Also, the patient is being discharged in 8 days to home to live with his sister in [place]. There will be a lot of arrangements here. I counseled the patient that he is absolutely going to have to be rigorous and offloading this area. I change the primary dressing to silver alginate CARLTON, BUSKEY ([id number]) Electronic Signature(s) Signed: 07/26/2019 5:27:47 PM By: Baltazar Najjar MD Entered By: Baltazar Najjar on 07/26/2019 11:48:45 Alvizo, [name] ([id number]) -------------------------------------------------------------------------------- Physical Exam Details Patient Name: [name], RAN. Date of Service: 07/26/2019 10:15 AM Medical Record Number: [id number] Patient Account Number: [id number] Date of Birth/Sex: [birth year] (64 y.o. M) Treating RN: Huel Coventry Primary Care Provider: Sindy Messing Other Clinician: Referring Provider: Sindy Messing Treating Provider/Extender: Maxwell Caul Weeks in Treatment: 8 Constitutional Sitting or standing Blood Pressure is within target range for patient.. Pulse regular and within target range for patient.[name] Respirations regular, non-labored and within target range.. Temperature is normal and within the target range for the patient.[name] appears in no distress. Notes Wound exam; the wound is somewhat more substantial but almost completely covered by an overhanging area of skin and subcutaneous tissue. Hemostasis with direct pressure and silver nitrate. There is no palpable bone. However the wound has depth and he is certainly not far off on there is no surrounding erythema and no purulence Electronic Signature(s) Signed: 07/26/2019 5:27:47 PM By: Baltazar Najjar MD Entered By:  Baltazar Najjar on 07/26/2019 11:56:38 Phegley, [name] ([id number]) -------------------------------------------------------------------------------- Physician Orders Details Patient Name: [name], CLONINGER. Date of Service: 07/26/2019 10:15 AM Medical Record Number: [id number] Patient Account Number: [id number] Date of Birth/Sex: 1956-07-31 (63 y.o. M) Treating RN: Huel Coventry Primary Care Provider: Sindy Messing Other Clinician: Referring Provider: Sindy Messing Treating Provider/Extender: Altamese [place] in Treatment: 8 Verbal / Phone Orders: No Diagnosis Coding Wound Cleansing Wound #2 Right Gluteus o Clean wound with Normal Saline. Anesthetic (add to Medication List) Wound #2 Right Gluteus o Topical Lidocaine 4% cream applied to wound bed prior to debridement (In Clinic Only). Primary Wound Dressing Wound #2 Right Gluteus o Silver Alginate Secondary Dressing Wound #2 Right Gluteus o Boardered Foam Dressing Dressing Change Frequency Wound #2 Right Gluteus o Change Dressing Monday, Wednesday, Friday - or as needed if soiled Follow-up Appointments Wound #2 Right Gluteus o Return Appointment in 3 weeks. Off-Loading Wound #2 Right Gluteus o Turn and reposition every 2 hours Electronic Signature(s) Signed: 07/26/2019 5:27:47 PM By: Baltazar Najjar MD Signed: 07/26/2019 5:53:54 PM By: Elliot Gurney, BSN, RN, CWS, Kim RN, BSN Entered By: Elliot Gurney, BSN, RN, CWS, [name] on  07/26/2019 10:44:32 [name], [name] ([id number]) -------------------------------------------------------------------------------- Problem List Details Patient Name: [name], PROTO. Date of Service: 07/26/2019 10:15 AM Medical Record Number: [id number] Patient Account Number: [id number] Date of Birth/Sex: July 23, 1956 (63 y.o. M) Treating RN: Huel Coventry Primary Care Provider: Sindy Messing Other Clinician: Referring Provider: Sindy Messing Treating Provider/Extender: Altamese [place] in Treatment:  8 Active Problems ICD-10 Evaluated Encounter Code Description Active Date Today Diagnosis L89.314 Pressure ulcer of right buttock, stage 4 05/31/2019 No Yes Inactive Problems Resolved Problems Electronic Signature(s) Signed: 07/26/2019 5:27:47 PM By: Baltazar Najjar MD Entered By: Baltazar Najjar on 07/26/2019 11:44:04 [name], [name] ([id number]) -------------------------------------------------------------------------------- Progress Note Details Patient Name: Ryan Carrow. Date of Service: 07/26/2019 10:15 AM Medical Record Number: [id number] Patient Account Number: [id number] Date of Birth/Sex: 1956-01-06 (63 y.o. M) Treating RN: Huel Coventry Primary Care Provider: Sindy Messing Other Clinician: Referring Provider: Sindy Messing Treating Provider/Extender: Maxwell Caul Weeks in Treatment: 8 Subjective History of Present Illness (HPI) ADMISSION 06/01/18 This is a 63 year old man who is sent to [place] currently residing at [facility] skilled facility. He has a complicated recent medical history. His sister accompanies him tells [place] that he has been somewhat disabled since suffering a left basal ganglial CVA in 2018. At that point in time he also had myocardial infarctions. More recently he has been hospitalized from 04/04/18 through 04/12/18 with septic shock, coronary artery disease, congestive heart failure and an acute CVA. During this hospitalization he was noted to have sacral wounds but these were not felt to be the source of infection however the source of the infection was never really determined at that point. I believe he was sent to a skilled facility. He reports that at the hospital on 05/09/18 through 05/18/18. At that point he was noted to have a large abscess of the right buttock. He required a surgical IandD by Dr. Gerrit Friends of general surgery. Culture of this grew Citrobacter,Eikenella and Bacteroides. He had a CT scan of the pelvis that showed an abnormal soft tissue  collection just caudal to the right ischial tuberosity measuring 5.9 and by 4.7. This also involved the distal right deep muscles. There was some cortical bone loss on the right ischial tuberosity and there was some concern with osteomyelitis. He was felt to require treatment for osteomyelitis based on clinical and radiographic findings. He did not have an MRI. Past medical history includes; abdominal aortic aneurysm, carotid stenosis, hypertension, osteoarthritis, interstitial lung disease on chronic steroids, rheumatoid arthritis, congestive heart failure/ischemic cardiomyopathy with an ejection fraction of 35-40% Also noteworthy is on 05/09/18 his albumin was 2.2 on admission to hospital. I don't think this is actually been repeated. The patient states his appetite is good and he is taking supplements 100%. 06/15/18-He is seen in follow up vibration for right buttock ulcer; this is stable in appearance with minimal amount of non-viable tissue. 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 We've been using a wound VAC. This is centered over the right ischial tuberosity. He is making generally good progress. He was severely hypoalbuminemic at one time although he states that he eats well and takes his supplements. 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 We have been using silver collagen over the superior aspect of the right ischial tuberosity under wound VAC.he has made some nice improvements. The dimensions of  the wound are certainly smaller. He has no exposed bone. We received lab work from [facility]. His prealbumin was 18.6 and albumin at 2.57. This is obviously still low. I am not sure what interventions are in place. I know he is on supplements. I discussed this with the patient. I told him that the  albumin represents already moderate to severe protein malnutrition. This would contribute it to do difficulty healing the wound 07/20/18; I received copies of lab work from [facility] on 07/06/18 documenting a sedimentation rate of 25 which seems to be in line with what I see from previous values however his C-reactive protein was 110.8 versus 84.7 on 06/22/18. I have looked through [facility] I don't see a prior value for either inflammatory measure. he has been treated with [name] which I think he is still on for osteomyelitis of the right ischial tuberosity as well as a soft tissue collection just caudad to the right ischial tuberosity itself. Felt to have a soft tissue abscess and/or stigmata of necrotizing fasciitis. I'm not sure who is following this. His CT scan was on 05/09/18 yet he still remains on Invanz. Looking back through the original discharge summary the [name] was supposed to finish on 07/05/18. 08/03/18 the patient continues to make good improvement. The area over the right ischial tuberosity has filled in nicely. His developed some what looks to be candidal skin infection in the periwound. I think they've taken the wound VAC off and are applying nystatin 08/17/18; 2 week follow-up for this resident from [facility]. They resumed the wound VAC. The area continues to fill in nicely. The only problem area is the tissue over the ischial tuberosity itself which needs to come up a bit. For this reason Kohlton Gilpatrick, Isom W. ([id number]) continuing the wound VAC. Otherwise this is doing remarkably well. The patient states that he is eating everything they give him although I don't see a follow-up albumin. 08/31/18; the patient states he had blood work done today. I do not have these results. He claims to be eating well. He continues with a wound VAC and the wound is making nice progress. This was a 1 time a deep stage IV wound over the right ischial tuberosity with accompanied  osteomyelitis 09/14/18; patient is still using a wound VAC to the large stage IV pressure ulcer On the right ischial tuberosity area.. This is coming quite nicely he still has one Onalee Hua that has some depth but I think by the next time we see him in 2 weeks we should be able to move on from the wound VAC. He is still at skilled care at [facility]. As far as I am aware he is eating and drinking well. I have not seen follow-up albumin studies on him recently but the message I'm getting is that he is eating well and taking his supplements. The patient states he is not very mobile and I am hopeful that we are able to insure pressure relief here however if we hadn't been ensuring pressure relief I doubt this would've closed in as well as it has even with the wound VAC. He does not complain of any systemic issues 09/28/2018 for follow-up and management of stage IV sacral ulcer that is being managed with a wound VAC. He is a resident at a long-term care facility, [facility]. The wound continues to have some depth and width some granulation of tissue on the outer borders of the wound. The wound was tender  to touch today during exam. He denies any pain to the wound when it is not being touched. Recent fevers, chills, nausea, vomiting, or shortness of breath. Hopeful over the next couple of weeks he will be able to transition from the wound VAC. 10/19/2018; this patient has a stage IV over the right ischial tuberosity. The wound is come to the surface of the skin and looks very healthy at this point. His wound VAC can be discontinued. The patient apparently is eating well taking his supplements. He has no systemic complaints 11/23/2018. The patient has a stage IV wound over his right ischial tuberosity that is now down to a quarter sized superficial area. There is no depth to this. The tissue looks healthy. No debridement was required. Continued improvement. We are using silver alginate and bordered  foam 2/19; the original stage IV wound over his right ischial tuberosity. Continues to make nice progress in terms of dimensions. There is no depth to this. We have been using silver alginate and border foam 3/11; this is in follow-up of his original stage IV wound over his right ischial tuberosity. This continues to make nice progress using silver alginate and border foam vigorous offloading. He is at [facility] skilled facility. Patient complains of terrible pain in his hands and having trouble getting pain medication which he has to ask for as needed. He apparently has a history of rheumatoid arthritis and I note he is on prednisone and chloroquine. [facility] rheumat[facility]. 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 This continues to make decent progress is smaller. There is some undermining at roughly 12-3 o'clock. We have been using silver alginate 4/29; patient's wound continues to get smaller. The remaining area is epithelializing. The undermining area still is not epithelialized however. We have been using silver collagen since the last time he is here. He does not come any notes from the facility where he lives [facility] but he claims to be eating well and vigorously offloading this area 5/20-Patient returns to clinic for right ischial wound. No notes available from facility, wound appears to be improving, we have been using Prisma and he has been offloading this area while using his wheelchair in fact he has started to use the walker again READMISSION 05/31/2019 This is a patient we cared for her for a prolonged period of time with a stage IV pressure area over his right ischio tuberosity. 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 We were able to get this to markedly improve with a wound VAC over time. We last saw this patient in clinic on 5/20 we are  using silver collagen and a small wound that was just about closed. Per the patient this wound actually did progress to closure although I do not have any independent verification of this from [facility] where he lives. He states that this reopened about 2 to 3 months ago. His timeframe is off however again I do not have any independent verification. He has a small wound with a lip lip of tissue over the top of this 8/26; wound is down to a very tiny open area about slight hyper granulation. The area is healing with epithelialization and a bit of a divot. I used silver nitrate. Continue with Hydrofera Blue 9/23; patient arrives today with the wound quite a bit worse. There is a lip  of skin and subcutaneous tissue over the majority of the visible part of this wound however this is fairly deep underneath this with exposed bone. At one point he had a large stage IV wound and this with underlying osteomyelitis. We have been using Hydrofera Blue. Also, the patient is being discharged in 8 days to home to live with his sister in [place]. There will be a lot of arrangements here. I counseled the patient that he is absolutely going to have to be rigorous and offloading this area. I change the primary dressing to silver alginate [name], [name] ([id number]) Objective Constitutional Sitting or standing Blood Pressure is within target range for patient.. Pulse regular and within target range for patient.[name] Respirations regular, non-labored and within target range.. Temperature is normal and within the target range for the patient.[name] appears in no distress. Vitals Time Taken: 10:10 AM, Height: 73 in, Weight: 155 lbs, BMI: 20.4, Temperature: 98.0 F, Pulse: 96 bpm, Respiratory Rate: 16 breaths/min, Blood Pressure: 119/64 mmHg. General Notes: Wound exam; the wound is somewhat more substantial but almost completely covered by an overhanging area of skin and subcutaneous tissue. Hemostasis with direct  pressure and silver nitrate. There is no palpable bone. However the wound has depth and he is certainly not far off on there is no surrounding erythema and no purulence Integumentary (Hair, Skin) Wound #2 status is Open. Original cause of wound was Pressure Injury. The wound is located on the Right Gluteus. The wound measures 0.6cm length x 0.6cm width x 0.3cm depth; 0.283cm^2 area and 0.085cm^3 volume. There is Fat Layer (Subcutaneous Tissue) Exposed exposed. There is no tunneling noted, however, there is undermining starting at 1:00 and ending at 3:00 with a maximum distance of 1cm. There is a medium amount of serous drainage noted. The wound margin is epibole. There is large (67-100%) pink granulation within the wound bed. There is a small (1-33%) amount of necrotic tissue within the wound bed including Adherent Slough. Assessment Active Problems ICD-10 Pressure ulcer of right buttock, stage 4 Procedures Wound #2 Pre-procedure diagnosis of Wound #2 is a Pressure Ulcer located on the Right Gluteus . There was a Excisional Skin/Subcutaneous Tissue Debridement with a total area of 0.18 sq cm performed by Maxwell Caul, MD. With the following instrument(s): Forceps Material removed includes Subcutaneous Tissue and Skin: Dermis and after achieving pain control using Lidocaine. No specimens were taken. A time out was conducted at 10:39, prior to the start of the procedure. A Minimum amount of bleeding was controlled with Pressure. The procedure was tolerated well. Post Debridement Measurements: 1.9cm length x 1cm width x 0.6cm depth; 0.895cm^3 volume. Post debridement Stage noted as Category/Stage IV. [name], [name] ([id number]) Character of Wound/Ulcer Post Debridement requires further debridement. Post procedure Diagnosis Wound #2: Same as Pre-Procedure Plan Wound Cleansing: Wound #2 Right Gluteus: Clean wound with Normal Saline. Anesthetic (add to Medication List): Wound #2 Right  Gluteus: Topical Lidocaine 4% cream applied to wound bed prior to debridement (In Clinic Only). Primary Wound Dressing: Wound #2 Right Gluteus: Silver Alginate Secondary Dressing: Wound #2 Right Gluteus: Boardered Foam Dressing Dressing Change Frequency: Wound #2 Right Gluteus: Change Dressing Monday, Wednesday, Friday - or as needed if soiled Follow-up Appointments: Wound #2 Right Gluteus: Return Appointment in 3 weeks. Off-Loading: Wound #2 Right Gluteus: Turn and reposition every 2 hours 1. I change the primary dressing to silver alginate 2. When he goes home he is going to need to vigorously offload this area. The area  is definitely worse than it was last time. Electronic Signature(s) Signed: 07/26/2019 5:27:47 PM By: Baltazar Najjar MD Entered By: Baltazar Najjar on 07/26/2019 11:57:16 [name], [name] ([id number]) -------------------------------------------------------------------------------- SuperBill Details Patient Name: NYJAH, DENIO. Date of Service: 07/26/2019 Medical Record Number: [id number] Patient Account Number: [id number] Date of Birth/Sex: [birth year] (63 y.o. M) Treating RN: Huel Coventry Primary Care Provider: Sindy Messing Other Clinician: Referring Provider: Sindy Messing Treating Provider/Extender: Maxwell Caul Weeks in Treatment: 8 Diagnosis Coding ICD-10 Codes Code Description L89.314 Pressure ulcer of right buttock, stage 4 Facility Procedures CPT4 Code: [id number] Description: 11042 - DEB SUBQ TISSUE 20 SQ CM/< ICD-10 Diagnosis Description L89.314 Pressure ulcer of right buttock, stage 4 Modifier: Quantity: 1 Physician Procedures CPT4 Code: [id number] Description: 11042 - WC PHYS SUBQ TISS 20 SQ CM ICD-10 Diagnosis Description L89.314 Pressure ulcer of right buttock, stage 4 Modifier: Quantity: 1 Electronic Signature(s) Signed: 07/26/2019 5:27:47 PM By: Baltazar Najjar MD Entered By: Baltazar Najjar on 07/26/2019 11:57:34

## 2019-07-27 NOTE — Progress Notes (Signed)
[name], [name] ([id number]) Visit Report for 07/26/2019 Arrival Information Details Patient Name: [name], [name] Date of Service: 07/26/2019 10:15 AM Medical Record Number: [id number] Patient Account Number: [id number] Date of Birth/Sex: [birth year] (63 y.o. M) Treating RN: Cornell Barman Primary Care Elaine Middleton: Domenica Fail Other Clinician: Referring Donnabelle Blanchard: Domenica Fail Treating Hrithik Boschee/Extender: Tito Dine in Treatment: 8 Visit Information History Since Last Visit Added or deleted any medications: No Patient Arrived: Wheel Chair Any new allergies or adverse reactions: No Arrival Time: 10:10 Had a fall or experienced change in No Accompanied By: self activities of daily living that may affect Transfer Assistance: Manual risk of falls: Patient Identification Verified: Yes Signs or symptoms of abuse/neglect since last visito No Secondary Verification Process Completed: Yes Hospitalized since last visit: No Implantable device outside of the clinic excluding No cellular tissue based products placed in the center since last visit: Has Dressing in Place as Prescribed: Yes Pain Present Now: No Electronic Signature(s) Signed: 07/26/2019 3:01:14 PM By: Lorine Bears RCP, RRT, CHT Entered By: Lorine Bears on 07/26/2019 10:10:25 Smail, Yifan W. ([id number]) -------------------------------------------------------------------------------- Lower Extremity Assessment Details Patient Name: [name], [name] Date of Service: 07/26/2019 10:15 AM Medical Record Number: [id number] Patient Account Number: [id number] Date of Birth/Sex: 11-14-1955 (63 y.o. M) Treating RN: Harold Barban Primary Care Mily Malecki: Domenica Fail Other Clinician: Referring Olson Lucarelli: Domenica Fail Treating Tequan Redmon/Extender: Ricard Dillon Weeks in Treatment: 8 Notes Wound Right Gluteus Electronic Signature(s) Signed: 07/26/2019 4:54:02 PM By: Harold Barban Entered By:  Harold Barban on 07/26/2019 10:21:56 Godinho, Mc W. ([id number]) -------------------------------------------------------------------------------- Multi Wound Chart Details Patient Name: [name] Date of Service: 07/26/2019 10:15 AM Medical Record Number: [id number] Patient Account Number: [id number] Date of Birth/Sex: 05-Nov-1955 (63 y.o. M) Treating RN: Cornell Barman Primary Care Paytin Ramakrishnan: Domenica Fail Other Clinician: Referring Jewell Haught: Domenica Fail Treating Atiyana Welte/Extender: Ricard Dillon Weeks in Treatment: 8 Vital Signs Height(in): 73 Pulse(bpm): 96 Weight(lbs): 155 Blood Pressure(mmHg): 119/64 Body Mass Index(BMI): 20 Temperature(F): 98.0 Respiratory Rate 16 (breaths/min): Photos: [N/A:N/A] Wound Location: Right Gluteus N/A N/A Wounding Event: Pressure Injury N/A N/A Primary Etiology: Pressure Ulcer N/A N/A Comorbid History: Cataracts, Congestive Heart N/A N/A Failure, Coronary Artery Disease, Hypertension, Myocardial Infarction, History of pressure wounds, Rheumatoid Arthritis Date Acquired: 05/03/2019 N/A N/A Weeks of Treatment: 8 N/A N/A Wound Status: Open N/A N/A Measurements L x W x D 0.6x0.6x0.3 N/A N/A (cm) Area (cm) : 0.283 N/A N/A Volume (cm) : 0.085 N/A N/A % Reduction in Area: 39.90% N/A N/A % Reduction in Volume: 9.60% N/A N/A Starting Position 1 1 (o'clock): Ending Position 1 3 (o'clock): Maximum Distance 1 (cm): 1 Undermining: Yes N/A N/A Classification: Category/Stage IV N/A N/A Exudate Amount: Medium N/A N/A Exudate Type: Serous N/A N/A Exudate Color: amber N/A N/A Wound Margin: Epibole N/A N/A Clack, Domanic W. ([id number]) Granulation Amount: Large (67-100%) N/A N/A Granulation Quality: Pink N/A N/A Necrotic Amount: Small (1-33%) N/A N/A Exposed Structures: Fat Layer (Subcutaneous N/A N/A Tissue) Exposed: Yes Fascia: No Tendon: No Muscle: No Joint: No Bone: No Epithelialization: Small (1-33%) N/A N/A Treatment  Notes Electronic Signature(s) Signed: 07/26/2019 5:53:54 PM By: Gretta Cool, BSN, RN, CWS, Kim RN, BSN Entered By: Gretta Cool, BSN, RN, CWS, Kim on 07/26/2019 10:39:19 [name] ([id number]) -------------------------------------------------------------------------------- [place] Details Patient Name: [name], [name] Date of Service: 07/26/2019 10:15 AM Medical Record Number: [id number] Patient Account Number: [id number] Date of Birth/Sex: 1956-02-23 (63 y.o. M) Treating RN: Cornell Barman Primary Care Ashyr Hedgepath: Domenica Fail Other Clinician:  Referring Kalliopi Coupland: Sindy Messing Treating Linnette Panella/Extender: Maxwell Caul Weeks in Treatment: 8 Active Inactive Pressure Nursing Diagnoses: Knowledge deficit related to causes and risk factors for pressure ulcer development Knowledge deficit related to management of pressures ulcers Goals: Patient/caregiver will verbalize risk factors for pressure ulcer development Date Initiated: 05/31/2019 Target Resolution Date: 07/01/2019 Goal Status: Active Interventions: Assess: immobility, friction, shearing, incontinence upon admission and as needed Assess offloading mechanisms upon admission and as needed Assess potential for pressure ulcer upon admission and as needed Provide education on pressure ulcers Notes: Wound/Skin Impairment Nursing Diagnoses: Impaired tissue integrity Goals: Ulcer/skin breakdown will have a volume reduction of 30% by week 4 Date Initiated: 05/31/2019 Target Resolution Date: 07/01/2019 Goal Status: Active Interventions: Assess patient/caregiver ability to perform ulcer/skin care regimen upon admission and as needed Assess ulceration(s) every visit Notes: Electronic Signature(s) Signed: 07/26/2019 5:53:54 PM By: Elliot Gurney, BSN, RN, CWS, Kim RN, BSN Entered By: Elliot Gurney, BSN, RN, CWS, Kim on 07/26/2019 10:36:47 [name], [name]  ([id number]) -------------------------------------------------------------------------------- Pain Assessment Details Patient Name: [name], [name] Date of Service: 07/26/2019 10:15 AM Medical Record Number: [id number] Patient Account Number: [id number] Date of Birth/Sex: [birth year] (63 y.o. M) Treating RN: Huel Coventry Primary Care Anquinette Pierro: Sindy Messing Other Clinician: Referring Eusebio Blazejewski: Sindy Messing Treating Klaudia Beirne/Extender: Maxwell Caul Weeks in Treatment: 8 Active Problems Location of Pain Severity and Description of Pain Patient Has Paino No Site Locations Pain Management and Medication Current Pain Management: Electronic Signature(s) Signed: 07/26/2019 3:01:14 PM By: Sallee Provencal, RRT, CHT Signed: 07/26/2019 5:53:54 PM By: Elliot Gurney, BSN, RN, CWS, Kim RN, BSN Entered By: Dayton Martes on 07/26/2019 10:11:15 [name] ([id number]) -------------------------------------------------------------------------------- Patient/Caregiver Education Details Patient Name: [name], [name] Date of Service: 07/26/2019 10:15 AM Medical Record Number: [id number] Patient Account Number: [id number] Date of Birth/Gender: [birth year] (62 y.o. M) Treating RN: Huel Coventry Primary Care Physician: Sindy Messing Other Clinician: Referring Physician: Sindy Messing Treating Physician/Extender: Altamese [place] in Treatment: 8 Education Assessment Education Provided To: Patient Education Topics Provided Wound/Skin Impairment: Handouts: Caring for Your Ulcer Methods: Demonstration, Explain/Verbal Responses: State content correctly Electronic Signature(s) Signed: 07/26/2019 5:53:54 PM By: Elliot Gurney, BSN, RN, CWS, Kim RN, BSN Entered By: Elliot Gurney, BSN, RN, CWS, Kim on 07/26/2019 10:45:03 [name] ([id number]) -------------------------------------------------------------------------------- Wound Assessment Details Patient Name: [name], [name] Date  of Service: 07/26/2019 10:15 AM Medical Record Number: [id number] Patient Account Number: [id number] Date of Birth/Sex: June 16, 1956 (63 y.o. M) Treating RN: Arnette Norris Primary Care Ferd Horrigan: Sindy Messing Other Clinician: Referring Jozalyn Baglio: Sindy Messing Treating Princes Finger/Extender: Maxwell Caul Weeks in Treatment: 8 Wound Status Wound Number: 2 Primary Pressure Ulcer Etiology: Wound Location: Right Gluteus Wound Open Wounding Event: Pressure Injury Status: Date Acquired: 05/03/2019 Comorbid Cataracts, Congestive Heart Failure, Coronary Weeks Of Treatment: 8 History: Artery Disease, Hypertension, Myocardial Clustered Wound: No Infarction, History of pressure wounds, Rheumatoid Arthritis Photos Wound Measurements Length: (cm) 0.6 % Reduction Width: (cm) 0.6 % Reduction Depth: (cm) 0.3 Epithelializ Area: (cm) 0.283 Tunneling: Volume: (cm) 0.085 Undermining Starting Ending Po Maximum D in Area: 39.9% in Volume: 9.6% ation: Small (1-33%) No : Yes Position (o'clock): 1 sition (o'clock): 3 istance: (cm) 1 Wound Description Classification: Category/Stage IV Foul Odor Af Wound Margin: Epibole Slough/Fibri Exudate Amount: Medium Exudate Type: Serous Exudate Color: amber ter Cleansing: No no No Wound Bed Granulation Amount: Large (67-100%) Exposed Structure Granulation Quality: Pink Fascia Exposed: No Necrotic Amount: Small (1-33%) Fat Layer (Subcutaneous Tissue) Exposed: Yes Necrotic Quality: Adherent Slough Tendon Exposed:  No Muscle Exposed: No [name], [name] ([id number]) Joint Exposed: No Bone Exposed: No Electronic Signature(s) Signed: 07/26/2019 4:54:02 PM By: Arnette Norris Entered By: Arnette Norris on 07/26/2019 10:21:35 [name], [name] ([id number]) -------------------------------------------------------------------------------- Vitals Details Patient Name: [name] Date of Service: 07/26/2019 10:15 AM Medical Record Number:  [id number] Patient Account Number: [id number] Date of Birth/Sex: [birth year] (63 y.o. M) Treating RN: Huel Coventry Primary Care Addis Tuohy: Sindy Messing Other Clinician: Referring Tabbitha Janvrin: Sindy Messing Treating Geovany Trudo/Extender: Altamese Early in Treatment: 8 Vital Signs Time Taken: 10:10 Temperature (F): 98.0 Height (in): 73 Pulse (bpm): 96 Weight (lbs): 155 Respiratory Rate (breaths/min): 16 Body Mass Index (BMI): 20.4 Blood Pressure (mmHg): 119/64 Reference Range: 80 - 120 mg / dl Electronic Signature(s) Signed: 07/26/2019 3:01:14 PM By: Dayton Martes RCP, RRT, CHT Entered By: Dayton Martes on 07/26/2019 10:11:48

## 2019-08-04 ENCOUNTER — Telehealth: Payer: Self-pay | Admitting: Internal Medicine

## 2019-08-04 DIAGNOSIS — J449 Chronic obstructive pulmonary disease, unspecified: Secondary | ICD-10-CM

## 2019-08-04 NOTE — Telephone Encounter (Signed)
LMTCB for Cathy 

## 2019-08-07 ENCOUNTER — Inpatient Hospital Stay (HOSPITAL_COMMUNITY): Payer: Medicaid Other

## 2019-08-07 ENCOUNTER — Inpatient Hospital Stay (HOSPITAL_COMMUNITY)
Admission: EM | Admit: 2019-08-07 | Discharge: 2019-08-13 | DRG: 981 | Disposition: A | Discharge status: Home Health | Payer: Medicaid Other | Attending: Internal Medicine | Admitting: Internal Medicine

## 2019-08-07 ENCOUNTER — Encounter (HOSPITAL_COMMUNITY): Payer: Self-pay | Admitting: Emergency Medicine

## 2019-08-07 ENCOUNTER — Emergency Department (HOSPITAL_COMMUNITY): Payer: Medicaid Other

## 2019-08-07 ENCOUNTER — Other Ambulatory Visit: Payer: Self-pay

## 2019-08-07 ENCOUNTER — Telehealth: Payer: Self-pay | Admitting: Physician Assistant

## 2019-08-07 DIAGNOSIS — J9601 Acute respiratory failure with hypoxia: Secondary | ICD-10-CM | POA: Diagnosis not present

## 2019-08-07 DIAGNOSIS — J84112 Idiopathic pulmonary fibrosis: Secondary | ICD-10-CM | POA: Diagnosis present

## 2019-08-07 DIAGNOSIS — F17210 Nicotine dependence, cigarettes, uncomplicated: Secondary | ICD-10-CM | POA: Diagnosis present

## 2019-08-07 DIAGNOSIS — M86151 Other acute osteomyelitis, right femur: Secondary | ICD-10-CM

## 2019-08-07 DIAGNOSIS — I5042 Chronic combined systolic (congestive) and diastolic (congestive) heart failure: Secondary | ICD-10-CM | POA: Diagnosis present

## 2019-08-07 DIAGNOSIS — E2740 Unspecified adrenocortical insufficiency: Secondary | ICD-10-CM

## 2019-08-07 DIAGNOSIS — Z0184 Encounter for antibody response examination: Secondary | ICD-10-CM | POA: Diagnosis not present

## 2019-08-07 DIAGNOSIS — F17200 Nicotine dependence, unspecified, uncomplicated: Secondary | ICD-10-CM | POA: Diagnosis present

## 2019-08-07 DIAGNOSIS — Z20828 Contact with and (suspected) exposure to other viral communicable diseases: Secondary | ICD-10-CM | POA: Diagnosis present

## 2019-08-07 DIAGNOSIS — I252 Old myocardial infarction: Secondary | ICD-10-CM

## 2019-08-07 DIAGNOSIS — R0602 Shortness of breath: Secondary | ICD-10-CM | POA: Diagnosis present

## 2019-08-07 DIAGNOSIS — Z955 Presence of coronary angioplasty implant and graft: Secondary | ICD-10-CM | POA: Diagnosis not present

## 2019-08-07 DIAGNOSIS — I255 Ischemic cardiomyopathy: Secondary | ICD-10-CM | POA: Diagnosis present

## 2019-08-07 DIAGNOSIS — I714 Abdominal aortic aneurysm, without rupture, unspecified: Secondary | ICD-10-CM

## 2019-08-07 DIAGNOSIS — I472 Ventricular tachycardia, unspecified: Secondary | ICD-10-CM

## 2019-08-07 DIAGNOSIS — Z8673 Personal history of transient ischemic attack (TIA), and cerebral infarction without residual deficits: Secondary | ICD-10-CM

## 2019-08-07 DIAGNOSIS — E785 Hyperlipidemia, unspecified: Secondary | ICD-10-CM | POA: Diagnosis present

## 2019-08-07 DIAGNOSIS — J9621 Acute and chronic respiratory failure with hypoxia: Secondary | ICD-10-CM

## 2019-08-07 DIAGNOSIS — J470 Bronchiectasis with acute lower respiratory infection: Secondary | ICD-10-CM | POA: Diagnosis present

## 2019-08-07 DIAGNOSIS — L89159 Pressure ulcer of sacral region, unspecified stage: Secondary | ICD-10-CM | POA: Diagnosis present

## 2019-08-07 DIAGNOSIS — I272 Pulmonary hypertension, unspecified: Secondary | ICD-10-CM | POA: Diagnosis present

## 2019-08-07 DIAGNOSIS — I130 Hypertensive heart and chronic kidney disease with heart failure and stage 1 through stage 4 chronic kidney disease, or unspecified chronic kidney disease: Secondary | ICD-10-CM | POA: Diagnosis present

## 2019-08-07 DIAGNOSIS — I25118 Atherosclerotic heart disease of native coronary artery with other forms of angina pectoris: Secondary | ICD-10-CM

## 2019-08-07 DIAGNOSIS — I10 Essential (primary) hypertension: Secondary | ICD-10-CM | POA: Diagnosis present

## 2019-08-07 DIAGNOSIS — R011 Cardiac murmur, unspecified: Secondary | ICD-10-CM | POA: Diagnosis present

## 2019-08-07 DIAGNOSIS — I6521 Occlusion and stenosis of right carotid artery: Secondary | ICD-10-CM | POA: Diagnosis present

## 2019-08-07 DIAGNOSIS — G4733 Obstructive sleep apnea (adult) (pediatric): Secondary | ICD-10-CM | POA: Diagnosis present

## 2019-08-07 DIAGNOSIS — G894 Chronic pain syndrome: Secondary | ICD-10-CM | POA: Diagnosis present

## 2019-08-07 DIAGNOSIS — I25810 Atherosclerosis of coronary artery bypass graft(s) without angina pectoris: Secondary | ICD-10-CM | POA: Diagnosis present

## 2019-08-07 DIAGNOSIS — J849 Interstitial pulmonary disease, unspecified: Secondary | ICD-10-CM | POA: Diagnosis not present

## 2019-08-07 DIAGNOSIS — Z9889 Other specified postprocedural states: Secondary | ICD-10-CM | POA: Diagnosis not present

## 2019-08-07 DIAGNOSIS — M069 Rheumatoid arthritis, unspecified: Secondary | ICD-10-CM | POA: Diagnosis present

## 2019-08-07 DIAGNOSIS — I340 Nonrheumatic mitral (valve) insufficiency: Secondary | ICD-10-CM | POA: Diagnosis not present

## 2019-08-07 DIAGNOSIS — I25708 Atherosclerosis of coronary artery bypass graft(s), unspecified, with other forms of angina pectoris: Secondary | ICD-10-CM | POA: Diagnosis not present

## 2019-08-07 DIAGNOSIS — I493 Ventricular premature depolarization: Secondary | ICD-10-CM | POA: Diagnosis not present

## 2019-08-07 DIAGNOSIS — Z79899 Other long term (current) drug therapy: Secondary | ICD-10-CM

## 2019-08-07 DIAGNOSIS — K219 Gastro-esophageal reflux disease without esophagitis: Secondary | ICD-10-CM | POA: Diagnosis present

## 2019-08-07 DIAGNOSIS — Z7902 Long term (current) use of antithrombotics/antiplatelets: Secondary | ICD-10-CM

## 2019-08-07 DIAGNOSIS — Z801 Family history of malignant neoplasm of trachea, bronchus and lung: Secondary | ICD-10-CM

## 2019-08-07 DIAGNOSIS — Z8249 Family history of ischemic heart disease and other diseases of the circulatory system: Secondary | ICD-10-CM

## 2019-08-07 DIAGNOSIS — L89214 Pressure ulcer of right hip, stage 4: Secondary | ICD-10-CM

## 2019-08-07 DIAGNOSIS — Z823 Family history of stroke: Secondary | ICD-10-CM

## 2019-08-07 DIAGNOSIS — J189 Pneumonia, unspecified organism: Secondary | ICD-10-CM

## 2019-08-07 DIAGNOSIS — Z7982 Long term (current) use of aspirin: Secondary | ICD-10-CM

## 2019-08-07 DIAGNOSIS — M0510 Rheumatoid lung disease with rheumatoid arthritis of unspecified site: Secondary | ICD-10-CM | POA: Diagnosis present

## 2019-08-07 DIAGNOSIS — Z9981 Dependence on supplemental oxygen: Secondary | ICD-10-CM

## 2019-08-07 DIAGNOSIS — I2510 Atherosclerotic heart disease of native coronary artery without angina pectoris: Secondary | ICD-10-CM | POA: Diagnosis not present

## 2019-08-07 DIAGNOSIS — Z79891 Long term (current) use of opiate analgesic: Secondary | ICD-10-CM

## 2019-08-07 DIAGNOSIS — Z8679 Personal history of other diseases of the circulatory system: Secondary | ICD-10-CM

## 2019-08-07 DIAGNOSIS — I5043 Acute on chronic combined systolic (congestive) and diastolic (congestive) heart failure: Secondary | ICD-10-CM | POA: Diagnosis not present

## 2019-08-07 DIAGNOSIS — Z7952 Long term (current) use of systemic steroids: Secondary | ICD-10-CM

## 2019-08-07 HISTORY — DX: Chronic obstructive pulmonary disease, unspecified: J44.9

## 2019-08-07 LAB — CBC WITH DIFFERENTIAL/PLATELET
Abs Immature Granulocytes: 0.04 10*3/uL (ref 0.00–0.07)
Basophils Absolute: 0 10*3/uL (ref 0.0–0.1)
Basophils Relative: 0 %
Eosinophils Absolute: 0.4 10*3/uL (ref 0.0–0.5)
Eosinophils Relative: 4 %
HCT: 41.8 % (ref 39.0–52.0)
Hemoglobin: 13.3 g/dL (ref 13.0–17.0)
Immature Granulocytes: 0 %
Lymphocytes Relative: 9 %
Lymphs Abs: 0.9 10*3/uL (ref 0.7–4.0)
MCH: 26.2 pg (ref 26.0–34.0)
MCHC: 31.8 g/dL (ref 30.0–36.0)
MCV: 82.3 fL (ref 80.0–100.0)
Monocytes Absolute: 0.4 10*3/uL (ref 0.1–1.0)
Monocytes Relative: 4 %
Neutro Abs: 8.2 10*3/uL — ABNORMAL HIGH (ref 1.7–7.7)
Neutrophils Relative %: 83 %
Platelets: 240 10*3/uL (ref 150–400)
RBC: 5.08 MIL/uL (ref 4.22–5.81)
RDW: 17.2 % — ABNORMAL HIGH (ref 11.5–15.5)
WBC: 9.9 10*3/uL (ref 4.0–10.5)
nRBC: 0 % (ref 0.0–0.2)

## 2019-08-07 LAB — TROPONIN I (HIGH SENSITIVITY)
Troponin I (High Sensitivity): 29 ng/L — ABNORMAL HIGH (ref <18)
Troponin I (High Sensitivity): 36 ng/L — ABNORMAL HIGH (ref <18)

## 2019-08-07 LAB — COMPREHENSIVE METABOLIC PANEL
ALT: 15 U/L (ref 0–44)
AST: 19 U/L (ref 15–41)
Albumin: 2.4 g/dL — ABNORMAL LOW (ref 3.5–5.0)
Alkaline Phosphatase: 79 U/L (ref 38–126)
Anion gap: 14 (ref 5–15)
BUN: 10 mg/dL (ref 8–23)
CO2: 19 mmol/L — ABNORMAL LOW (ref 22–32)
Calcium: 8.3 mg/dL — ABNORMAL LOW (ref 8.9–10.3)
Chloride: 103 mmol/L (ref 98–111)
Creatinine, Ser: 0.93 mg/dL (ref 0.61–1.24)
GFR calc Af Amer: >60 mL/min (ref >60)
GFR calc non Af Amer: >60 mL/min (ref >60)
Glucose, Bld: 140 mg/dL — ABNORMAL HIGH (ref 70–99)
Potassium: 3.9 mmol/L (ref 3.5–5.1)
Sodium: 136 mmol/L (ref 135–145)
Total Bilirubin: 1.7 mg/dL — ABNORMAL HIGH (ref 0.3–1.2)
Total Protein: 7.2 g/dL (ref 6.5–8.1)

## 2019-08-07 LAB — RESPIRATORY PANEL BY PCR

## 2019-08-07 LAB — LACTIC ACID, PLASMA
Lactic Acid, Venous: 2.7 mmol/L (ref 0.5–1.9)
Lactic Acid, Venous: 2 mmol/L (ref 0.5–1.9)

## 2019-08-07 LAB — BRAIN NATRIURETIC PEPTIDE: B Natriuretic Peptide: 304.3 pg/mL — ABNORMAL HIGH (ref 0.0–100.0)

## 2019-08-07 LAB — SARS CORONAVIRUS 2 BY RT PCR (HOSPITAL ORDER, PERFORMED IN ~~LOC~~ HOSPITAL LAB): SARS Coronavirus 2: NEGATIVE

## 2019-08-07 LAB — HIV ANTIBODY (ROUTINE TESTING W REFLEX): HIV Screen 4th Generation wRfx: NONREACTIVE

## 2019-08-07 MED ORDER — SODIUM CHLORIDE 0.9 % IV SOLN
500.0000 mg | INTRAVENOUS | Status: DC
  Administered 2019-08-07: 500 mg via INTRAVENOUS
  Filled 2019-08-07 (×2): qty 500

## 2019-08-07 MED ORDER — BUPROPION HCL ER (SR) 100 MG PO TB12
100.0000 mg | ORAL_TABLET | Freq: Two times a day (BID) | ORAL | Status: DC
  Administered 2019-08-07 – 2019-08-13 (×11): 100 mg via ORAL
  Filled 2019-08-07 (×16): qty 1

## 2019-08-07 MED ORDER — MAGNESIUM HYDROXIDE 400 MG/5ML PO SUSP: 30.0000 mL | Freq: Every day | ORAL | Status: DC | PRN

## 2019-08-07 MED ORDER — IPRATROPIUM-ALBUTEROL 0.5-2.5 (3) MG/3ML IN SOLN
3.0000 mL | Freq: Four times a day (QID) | RESPIRATORY_TRACT | Status: DC
  Administered 2019-08-07 – 2019-08-08 (×4): 3 mL via RESPIRATORY_TRACT
  Filled 2019-08-07 (×5): qty 3

## 2019-08-07 MED ORDER — ADULT MULTIVITAMIN W/MINERALS CH
1.0000 | ORAL_TABLET | Freq: Every day | ORAL | Status: DC
  Administered 2019-08-07 – 2019-08-13 (×6): 1 via ORAL
  Filled 2019-08-07 (×6): qty 1

## 2019-08-07 MED ORDER — JUVEN PO PACK
1.0000 | PACK | Freq: Two times a day (BID) | ORAL | Status: DC
  Administered 2019-08-07 – 2019-08-13 (×11): 1 via ORAL
  Filled 2019-08-07 (×14): qty 1

## 2019-08-07 MED ORDER — TAMSULOSIN HCL 0.4 MG PO CAPS
0.4000 mg | ORAL_CAPSULE | Freq: Every day | ORAL | Status: DC
  Administered 2019-08-07 – 2019-08-12 (×6): 0.4 mg via ORAL
  Filled 2019-08-07 (×6): qty 1

## 2019-08-07 MED ORDER — CHLORHEXIDINE GLUCONATE CLOTH 2 % EX PADS
6.0000 | MEDICATED_PAD | Freq: Every day | CUTANEOUS | Status: DC
  Administered 2019-08-07 – 2019-08-11 (×4): 6 via TOPICAL

## 2019-08-07 MED ORDER — CARVEDILOL 6.25 MG PO TABS
6.2500 mg | ORAL_TABLET | Freq: Two times a day (BID) | ORAL | Status: DC
  Administered 2019-08-07 – 2019-08-09 (×4): 6.25 mg via ORAL
  Filled 2019-08-07 (×5): qty 1

## 2019-08-07 MED ORDER — SACCHAROMYCES BOULARDII 250 MG PO CAPS
250.0000 mg | ORAL_CAPSULE | Freq: Two times a day (BID) | ORAL | Status: DC
  Administered 2019-08-07 – 2019-08-13 (×12): 250 mg via ORAL
  Filled 2019-08-07 (×13): qty 1

## 2019-08-07 MED ORDER — MORPHINE SULFATE 30 MG PO TABS: 30.0000 mg | ORAL_TABLET | ORAL | Status: DC | PRN

## 2019-08-07 MED ORDER — HYDROCODONE-ACETAMINOPHEN 10-325 MG PO TABS
1.0000 | ORAL_TABLET | Freq: Three times a day (TID) | ORAL | Status: DC
  Administered 2019-08-07 – 2019-08-13 (×18): 1 via ORAL
  Filled 2019-08-07 (×18): qty 1

## 2019-08-07 MED ORDER — ENOXAPARIN SODIUM 40 MG/0.4ML ~~LOC~~ SOLN
40.0000 mg | SUBCUTANEOUS | Status: DC
  Administered 2019-08-07: 40 mg via SUBCUTANEOUS
  Filled 2019-08-07: qty 0.4

## 2019-08-07 MED ORDER — SODIUM CHLORIDE 0.9 % IV SOLN
2.0000 g | INTRAVENOUS | Status: DC
  Administered 2019-08-07 – 2019-08-08 (×2): 2 g via INTRAVENOUS
  Filled 2019-08-07: qty 2
  Filled 2019-08-07: qty 20

## 2019-08-07 MED ORDER — ASPIRIN EC 81 MG PO TBEC
81.0000 mg | DELAYED_RELEASE_TABLET | Freq: Every day | ORAL | Status: DC
  Administered 2019-08-07 – 2019-08-09 (×3): 81 mg via ORAL
  Filled 2019-08-07 (×3): qty 1

## 2019-08-07 MED ORDER — CLOPIDOGREL BISULFATE 75 MG PO TABS: 75.0000 mg | ORAL_TABLET | Freq: Every day | ORAL | Status: DC

## 2019-08-07 MED ORDER — ALBUTEROL SULFATE (2.5 MG/3ML) 0.083% IN NEBU
2.5000 mg | INHALATION_SOLUTION | RESPIRATORY_TRACT | Status: DC | PRN
  Administered 2019-08-10: 2.5 mg via RESPIRATORY_TRACT

## 2019-08-07 MED ORDER — VANCOMYCIN HCL IN DEXTROSE 1-5 GM/200ML-% IV SOLN
1000.0000 mg | Freq: Once | INTRAVENOUS | Status: AC
  Administered 2019-08-07: 1000 mg via INTRAVENOUS
  Filled 2019-08-07: qty 200

## 2019-08-07 MED ORDER — HEPARIN (PORCINE) 25000 UT/250ML-% IV SOLN: 1250.0000 [IU]/h | INTRAVENOUS | Status: DC

## 2019-08-07 MED ORDER — POLYETHYLENE GLYCOL 3350 17 G PO PACK
17.0000 g | PACK | Freq: Every day | ORAL | Status: DC
  Administered 2019-08-11 – 2019-08-12 (×2): 17 g via ORAL
  Filled 2019-08-07 (×4): qty 1

## 2019-08-07 MED ORDER — ATORVASTATIN CALCIUM 80 MG PO TABS
80.0000 mg | ORAL_TABLET | Freq: Every day | ORAL | Status: DC
  Administered 2019-08-07 – 2019-08-12 (×6): 80 mg via ORAL
  Filled 2019-08-07 (×6): qty 1

## 2019-08-07 MED ORDER — IPRATROPIUM-ALBUTEROL 0.5-2.5 (3) MG/3ML IN SOLN: 3.0000 mL | Freq: Four times a day (QID) | RESPIRATORY_TRACT | Status: DC

## 2019-08-07 MED ORDER — PIPERACILLIN-TAZOBACTAM 3.375 G IVPB
3.3750 g | Freq: Once | INTRAVENOUS | Status: DC
  Administered 2019-08-07: 3.375 g via INTRAVENOUS
  Filled 2019-08-07: qty 50

## 2019-08-07 MED ORDER — IOHEXOL 350 MG/ML SOLN
100.0000 mL | Freq: Once | INTRAVENOUS | Status: AC | PRN
  Administered 2019-08-07: 100 mL via INTRAVENOUS

## 2019-08-07 MED ORDER — GUAIFENESIN ER 600 MG PO TB12: 600.0000 mg | ORAL_TABLET | Freq: Two times a day (BID) | ORAL | Status: DC | PRN

## 2019-08-07 MED ORDER — NICOTINE 7 MG/24HR TD PT24
7.0000 mg | MEDICATED_PATCH | Freq: Every day | TRANSDERMAL | Status: DC
  Filled 2019-08-07 (×5): qty 1

## 2019-08-07 MED ORDER — HYDROCORTISONE NA SUCCINATE PF 100 MG IJ SOLR
50.0000 mg | Freq: Four times a day (QID) | INTRAMUSCULAR | Status: DC
  Administered 2019-08-07 – 2019-08-08 (×3): 50 mg via INTRAVENOUS
  Filled 2019-08-07 (×3): qty 2

## 2019-08-07 MED ORDER — PRO-STAT SUGAR FREE PO LIQD: 30.0000 mL | Freq: Two times a day (BID) | ORAL | Status: DC

## 2019-08-07 MED ORDER — HEPARIN BOLUS VIA INFUSION
3500.0000 [IU] | Freq: Once | INTRAVENOUS | Status: DC
  Filled 2019-08-07: qty 3500

## 2019-08-07 MED ORDER — MIRTAZAPINE 7.5 MG PO TABS
7.5000 mg | ORAL_TABLET | Freq: Every day | ORAL | Status: DC
  Administered 2019-08-07 – 2019-08-12 (×6): 7.5 mg via ORAL
  Filled 2019-08-07 (×7): qty 1

## 2019-08-07 MED ORDER — ALBUTEROL SULFATE HFA 108 (90 BASE) MCG/ACT IN AERS
6.0000 | INHALATION_SPRAY | Freq: Once | RESPIRATORY_TRACT | Status: AC
  Administered 2019-08-07: 6 via RESPIRATORY_TRACT
  Filled 2019-08-07: qty 6.7

## 2019-08-07 MED ORDER — HYDROXYCHLOROQUINE SULFATE 200 MG PO TABS
200.0000 mg | ORAL_TABLET | Freq: Two times a day (BID) | ORAL | Status: DC
  Administered 2019-08-07 – 2019-08-13 (×11): 200 mg via ORAL
  Filled 2019-08-07 (×14): qty 1

## 2019-08-07 MED ORDER — HEPARIN BOLUS VIA INFUSION
2500.0000 [IU] | Freq: Once | INTRAVENOUS | Status: DC
  Filled 2019-08-07: qty 2500

## 2019-08-07 MED ORDER — GABAPENTIN 300 MG PO CAPS
300.0000 mg | ORAL_CAPSULE | Freq: Three times a day (TID) | ORAL | Status: DC
  Administered 2019-08-07 – 2019-08-13 (×19): 300 mg via ORAL
  Filled 2019-08-07 (×19): qty 1

## 2019-08-07 NOTE — Telephone Encounter (Signed)
Physical therapist Fields from [facility] health called to request  Verbal orders prior to establishing care on 10/14 with Sharyn Lull edwards, please follow up if possible. Home health Physical therapy -1 week 1 -2 week 6 Started on 08/04/2019  [phone] p

## 2019-08-07 NOTE — H&P (Signed)
History and Physical    SHEDDRICK ARRONA KVQ:[id number] DOB: 26-Oct-1956 DOA: 08/07/2019  PCP: [name], PA-C Consultants:  [name] - pulmonology; Robson - wound; [place]hal[place] - rheumatology; [place] - pain management Patient coming from:   Home - lives with sister; Jackey Loge: Sister, [phone]  Chief Complaint: SOB  HPI: [name] is a 63 y.o. male with medical history significant of CVA; RA; CAD; ILD; COPD; chronic combined CHD; and AAA presenting with SOB.  He reports that he startd having breathing problems and chest pains yesterday during the day yesterday.  He got up out of his chair and went to the bedroom and he couldn't catch his breath. No problems prior.  No fever or cough.  He is back to baseline now, thinks the breathing treatments and oxygen made him better.  He doesn't wear home O2.  No sick contacts.   ED Course:  Carryover, per Dr. Toniann Fail:  Known history of CAD status post CABG and COPD and rheumatoid arthritis was brought to the ER after patient was having shortness of breath and fever at home. In the ER patient was afebrile but was requiring about 5 to 6 L of oxygen to maintain sats. Patient is talking and not in distress. But still maintaining only sats on 6 L. At this time chest x-ray concerning for infiltrates and patient started on antibiotics for pneumonia. Patient was recently in rehab.   Review of Systems: As per HPI; otherwise review of systems reviewed and negative.   Ambulatory Status:  Ambulates with a walker  Past Medical History:  Diagnosis Date  . AAA (abdominal aortic aneurysm) (HCC)   . AKI (acute kidney injury) (HCC)    a. Cr up to 8 in 04/2018.  . Arthritis   . CAD in native artery    a. h/o MI x 3 s/p CABG x 4 (1993 with Tyrone Sage). b. LHC 01/2017 with severe native disease, 2 grafts occluded.  . Carotid artery disease (HCC)    a. s/p R CEA 11/2016.  [name] Chronic combined systolic and diastolic CHF (congestive heart failure) (HCC)    . COPD (chronic obstructive pulmonary disease) (HCC)   . GERD (gastroesophageal reflux disease)   . Hyperkalemia   . ILD (interstitial lung disease) (HCC)   . Lung nodule   . MI (myocardial infarction) (HCC)    x 3   . Normocytic anemia   . NSVT (nonsustained ventricular tachycardia) (HCC)   . Pancreatitis    1/19  . Pneumonia 11/2017  . RA (rheumatoid arthritis) (HCC)   . Stroke (HCC)    a. 2018. b. possible recurrence in 04/2018, seen by neuro  . Tobacco abuse     Past Surgical History:  Procedure Laterality Date  . CARDIAC CATHETERIZATION    . CAROTID ENDARTERECTOMY Left   . Heart Bypass  10/1992  . INCISION AND DRAINAGE PERIRECTAL ABSCESS Right 05/09/2018   Procedure: IRRIGATION AND DEBRIDEMENT  RIGHT BUTTOCK ABSCESS;  Surgeon: Darnell Level, MD;  Location: [facility];  Service: General;  Laterality: Right;  . LEFT HEART CATH AND CORS/GRAFTS ANGIOGRAPHY N/A 03/01/2017   Procedure: Left Heart Cath and Cors/Grafts Angiography;  Surgeon: Lyn Records, MD;  Location: [facility] INVASIVE CV LAB;  Service: Cardiovascular;  Laterality: N/A;  . Throat Biopsy     Cat scratch fever    Social History   Socioeconomic History  . Marital status: Single    Spouse name: Not on file  . Number of children: 1  . Years  of education: Not on file  . Highest education level: Not on file  Occupational History  . 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 Financial resource strain: Not on file  . Food insecurity    Worry: Not on file    Inability: Not on file  . Transportation needs    Medical: Not on file    Non-medical: Not on file  Tobacco Use  . Smoking status: Current Every Day Smoker    Packs/day: 1.00    Years: 47.00    Pack years: 47.00    Types: Cigarettes  . Smokeless tobacco: Never Used  . Tobacco comment: 0.5 pack daily or less now  Substance and Sexual Activity  . Alcohol use: No    Comment: remote history of beer drinking  . Drug use: No   . Sexual activity: Not on file  Lifestyle  . Physical activity    Days per week: Not on file    Minutes per session: Not on file  . Stress: Not on file  Relationships  . Social Herbalist on phone: Not on file    Gets together: Not on file    Attends religious service: Not on file    Active member of club or organization: Not on file    Attends meetings of clubs or organizations: Not on file    Relationship status: Not on file  . Intimate partner violence    Fear of current or ex partner: Not on file    Emotionally abused: Not on file    Physically abused: Not on file    Forced sexual activity: Not on file  Other Topics Concern  . Not on file  Social History Narrative  . Not on file    No Known Allergies  Family History  Problem Relation Age of Onset  . Heart murmur Mother   . Heart disease Mother   . Heart attack Mother   . Suicidality Father   . Heart attack Father   . Stroke Brother   . Heart attack Brother   . Heart attack Maternal Grandmother   . Cancer Maternal Grandmother        unknown type  . Lung cancer Maternal Aunt   . Lung cancer Maternal Uncle   . Cancer Maternal Aunt        unknown cancer  . Heart attack Brother     Prior to Admission medications   Medication Sig Start Date End Date Taking? Authorizing Provider  acetaminophen (TYLENOL) 500 MG tablet Take 1,000 mg by mouth every 6 (six) hours as needed for mild pain.     [provider]  albuterol (PROVENTIL) (2.5 MG/3ML) 0.083% nebulizer solution Take 2.5 mg by nebulization every 6 (six) hours as needed for wheezing or shortness of breath.    [provider]  Amino Acids-Protein Hydrolys (FEEDING SUPPLEMENT, PRO-STAT SUGAR FREE 64,) LIQD Take 30 mLs by mouth 2 (two) times daily. 05/18/18   Eugenie Filler, MD  aspirin EC 81 MG tablet Take by mouth. 12/03/16   [provider]  atorvastatin (LIPITOR) 40 MG tablet Take 1 tablet (40 mg total) by mouth daily at 6 PM.  Patient taking differently: Take 80 mg by mouth daily at 6 PM.  03/15/17   Dunn, Nedra Hai, PA-C  carvedilol (COREG) 6.25 MG tablet Take 1 tablet (6.25 mg total) by mouth 2 (two) times daily with a meal. 05/18/18 08/15/18  Rodolph Bonghompson, Daniel V, MD  cephALEXin (KEFLEX) 500 MG capsule Take 500 mg by mouth 3 (three) times daily.    [provider]  clopidogrel (PLAVIX) 75 MG tablet Take 75 mg by mouth daily.    [provider]  enoxaparin (LOVENOX) 40 MG/0.4ML injection Inject 0.4 mLs (40 mg total) into the skin daily. 05/19/18   Rodolph Bonghompson, Daniel V, MD  furosemide (LASIX) 20 MG tablet Take by mouth. 02/07/18   [provider]  gabapentin (NEURONTIN) 300 MG capsule Take 1 capsule (300 mg total) by mouth every 8 (eight) hours. 01/18/18   Edward JollyLateef, Bilal, MD  HYDROcodone-acetaminophen (NORCO) 10-325 MG tablet Take 1 tablet by mouth 3 (three) times daily. 06/07/18   Shon HaleEmokpae, Courage, MD  hydroxychloroquine (PLAQUENIL) 200 MG tablet Take 1 tablet (200 mg total) by mouth 2 (two) times daily. 06/22/18   Rodolph Bonghompson, Daniel V, MD  ipratropium-albuterol (DUONEB) 0.5-2.5 (3) MG/3ML SOLN Take 3 mLs by nebulization 2 (two) times daily. 05/18/18   Rodolph Bonghompson, Daniel V, MD  magnesium hydroxide (MILK OF MAGNESIA) 400 MG/5ML suspension Take 30 mLs by mouth daily as needed for mild constipation. And QHS    [provider]  Menthol-Methyl Salicylate (MUSCLE RUB EX) Apply 1 application topically as needed (for shoulder or knee pain).     [provider]  mirtazapine (REMERON) 7.5 MG tablet Take 7.5 mg by mouth at bedtime.    [provider]  morphine (MSIR) 30 MG tablet Take 1 tablet (30 mg total) by mouth every 4 (four) hours as needed (breakthrough pain). 06/07/18   Shon HaleEmokpae, Courage, MD  Multiple Vitamin (MULTIVITAMIN WITH MINERALS) TABS tablet Take 1 tablet by mouth daily. 05/19/18   Rodolph Bonghompson, Daniel V, MD  nicotine (NICODERM CQ - DOSED IN MG/24 HR) 7 mg/24hr patch Place 1 patch (7 mg  total) onto the skin daily. 04/13/18   Zigmund DanielPowell, A [name] Jr., MD  nutrition supplement, JUVEN, (JUVEN) PACK Take 1 packet by mouth 2 (two) times daily between meals. 05/18/18   Rodolph Bonghompson, Daniel V, MD  nystatin cream (MYCOSTATIN) Apply topically 2 (two) times daily. Patient taking differently: Apply 1 application topically 2 (two) times daily.  05/18/18   Rodolph Bonghompson, Daniel V, MD  ondansetron (ZOFRAN) 4 MG tablet Take 1 tablet (4 mg total) by mouth every 6 (six) hours as needed for nausea. 06/07/18   Shon HaleEmokpae, Courage, MD  polyethylene glycol (MIRALAX / GLYCOLAX) packet Take 17 g by mouth 2 (two) times daily. Until having 1 soft BM daily, then can decrease to daily or as needed Patient taking differently: Take 17 g by mouth daily. Until having 1 soft BM daily, then can decrease to daily or as needed 04/12/18   Zigmund DanielPowell, A [name] Jr., MD  potassium chloride SA (K-DUR,KLOR-CON) 20 MEQ tablet Take 2 tablets (40 mEq total) by mouth daily. Patient taking differently: Take 20 mEq by mouth daily.  05/19/18   Rodolph Bonghompson, Daniel V, MD  predniSONE (DELTASONE) 10 MG tablet Take 10 mg by mouth daily with breakfast.    [provider]  PROVENTIL HFA 108 (90 Base) MCG/ACT inhaler INHALE 2 PUFFS EVERY 6 HOURS AS NEEDED FOR WHEEZE/SHORTNESS OF BREATH 03/29/18   Kalman Shanamaswamy, Murali, MD  saccharomyces boulardii (FLORASTOR) 250 MG capsule Take 1 capsule (250 mg total) by mouth 2 (two) times daily. 05/18/18   Rodolph Bonghompson, Daniel V, MD  sacubitril-valsartan (ENTRESTO) 49-51 MG Take by mouth. 01/14/18   [provider]  tamsulosin (FLOMAX) 0.4 MG CAPS capsule Take 1 capsule (0.4 mg total)  by mouth daily after supper. 06/07/18   Emokpae, Courage, MD  UNABLE TO FIND Take 90 mLs by mouth 2 (two) times daily. Medpass    [provider]    Physical Exam: Vitals:   08/07/19 1000 08/07/19 1134 08/07/19 1145 08/07/19 1345  BP: 137/77 135/78 116/78 113/67  Pulse: (!) 102 (!) 102 98 85  Resp: (!) 38 (!) 36 (!) 45 (!) 35   Temp:      TempSrc:      SpO2: 98% 95% 97% 97%  Weight:      Height:         . General:  Appears calm and comfortable and is NAD despite mild tachypnea . Eyes:  PERRL, EOMI, normal lids, iris . ENT:  Hard of hearing, normal lips & tongue, mmm . Neck:  no LAD, masses or thyromegaly . Cardiovascular:  RR with mild tachcyardia, no m/r/g. No LE edema.  [name]. Respiratory:   Coarse diffuse rales c/w ILD without apparent infiltrate, fairly good air movement.  Increased respiratory effort. . Abdomen:  soft, NT, ND, NABS . Back:   normal alignment, no CVAT . Skin:  no rash or induration seen on limited exam . Musculoskeletal:  grossly normal tone BUE/BLE, good ROM, no bony abnormality . Psychiatric:  grossly normal mood and affect, speech fluent and appropriate, AOx3 . Neurologic:  CN 2-12 grossly intact, moves all extremities in coordinated fashion, sensation intact    Radiological Exams on Admission: Dg Chest Port 1 View  Result Date: 08/07/2019 CLINICAL DATA:  Shortness of breath EXAM: PORTABLE CHEST 1 VIEW COMPARISON:  06/04/2018 FINDINGS: Generalized coarse interstitial opacity. Cardiomegaly. CABG. No acute finding when compared to prior. The PICC has been removed. IMPRESSION: 1. Pulmonary fibrosis with stable aeration compared to study last month. Pneumonia could easily be obscured given the extent of chronic lung disease. 2. Cardiomegaly. Electronically Signed   By: Marnee SpringJonathon  Watts M.D.   On: 08/07/2019 05:22    EKG: Independently reviewed.  Sinus tachycardia with rate 125; nonspecific ST changes with no evidence of acute ischemia; NSCSLT   Labs on Admission: I have personally reviewed the available labs and imaging studies at the time of the admission.  Pertinent labs:   CO2 19 Glucose 140 Albumin 2.4 Bili 1.7 BNP 304.3 HS troponin 29, 36 Lactate 2.7, 2.0 Unremarkable CBC COVID negative   Assessment/Plan Principal Problem:   Acute on chronic respiratory failure with  hypoxia (HCC) Active Problems:   ILD (interstitial lung disease) (HCC)   HTN (hypertension)   Current smoker   Chronic combined systolic and diastolic CHF (congestive heart failure) (HCC)   Chronic pain syndrome   Rheumatoid arthritis (HCC)   Acute on chronic respiratory failure with hypoxia, likely resulting from ILD flare -[facility] -He is not chronically on home O2 but may need this -1-2 days of worsening SOB; patient was profoundly SOB with increased WOB and O2 sats to 88% -He denies infectious symptoms and does not appear to have recent exposures that make this likely. -COVDID negative -Will treat with Duonebs, albuterol prn, Mucinex, and high-dose corticosteroids for now. -Will request pulmonary consult -Will continue empiric treatment of PNA with Rocephin and Azithromycin for now  Chronic combined CHF -6/19 Echo with EF 30-35% and grade 2 diastolic dysfunction -BNP is not significantly elevated -He does not appear to have significant edema at this time -Low suspicion for acute CHF exacerbation at this time -Hold IVF  Chronic pain -Continue Norco, Remeron, Wellbutrin -  Interestingly, [place] Controlled Substances Registry does not indicate any controlled substances prescribed since 6/19  RA -Continue Plaquenil -If high suspicion for infectious etiology, this medication should likely be held -Will defer to pulm  HTN -Continue Coreg  Tobacco dependence -Encourage cessation.   -This was discussed with the patient and should be reviewed on an ongoing basis.   -Patch ordered at patient request.  HLD -Continue Lipitor   Note: This patient has been tested and is negative for the novel coronavirus COVID-19.  DVT prophylaxis:  Lovenox  Code Status:  Full - confirmed with patient Family Communication: None present; I was unable to reach his sister by telephone Disposition Plan:  Home once clinically improved Consults called: Pulmonology   Admission status: Admit - It is my clinical opinion that admission to INPATIENT is reasonable and necessary because of the expectation that this patient will require hospital care that crosses at least 2 midnights to treat this condition based on the medical complexity of the problems presented.  Given the aforementioned information, the predictability of an adverse outcome is felt to be significant.    Jonah Blue MD [facility]   How to contact the [facility] Attending or Consulting provider 7A - 7P or covering provider during after hours 7P -7A, for this patient?  1. Check the care team in [facility] and look for a) attending/consulting [facility] provider listed and b) the [facility] team listed 2. Log into [email or web address] and use [facility]'s universal password to access. If you do not have the password, please contact the hospital operator. 3. Locate the [facility] provider you are looking for under [facility] and page to a number that you can be directly reached. 4. If you still have difficulty reaching the provider, please page the [facility] (Director on Call) for the Hospitalists listed on amion for assistance.   08/07/2019, 4:55 PM

## 2019-08-07 NOTE — Telephone Encounter (Signed)
Spoke with Tye [place]  She is asking for order for neb tubing to be sent to [facility]  Order sent  I advised needs f/u here with MR and offered to schedule appt but she refused to schedule at this time b/c pt was just admitted to the hospital  She will call to schedule his f/u once he is d/c  Nothing further needed at this time

## 2019-08-07 NOTE — ED Notes (Signed)
Pt 92% on 2L [place]. Titrated to 6L [place] and pt now at 97%

## 2019-08-07 NOTE — Progress Notes (Signed)
[facility] Progress Note Patient Name: [name] DOB: 1955/11/15 MRN: [id number]   Date of Service  08/07/2019  HPI/Events of Note  Chest CTA negative for PE.   eICU Interventions  Will D/C Heparin IV infusion.      Intervention Category Major Interventions: Other:  Sommer,Steven Cornelia Copa 08/07/2019, 10:40 PM

## 2019-08-07 NOTE — Telephone Encounter (Signed)
Sent to Provider. Nat Christen, CMA

## 2019-08-07 NOTE — Consult Note (Addendum)
NAME:  [name], MRN:  [id number], DOB:  [birth year], LOS: 0 ADMISSION DATE:  08/07/2019, CONSULTATION DATE:  - 08/07/2019 REFERRING MD:  Dr Toniann Fail - [facility], CHIEF COMPLAINT:  Worsening acute on hypoxemic resp failure   Brief History   Acute on chronic hypoxemic respiratory failure in a patient with rheumatoid arthritis related ILD.  History of present illness    Patient follows up with [name] in interstitial lung disease clinic.  He has rheumatoid arthritis related UIP/ILD.  This is a progressive phenotype.  I last saw him in June 2019 at that time his lung function was stable but he had a deterioration in his functional status because of sepsis.  After that he was lost to follow-up.  Review of the chart indicates that he has had a nursing home stay all along and according to him he got discharged home only on August 03, 2019.  He says over the course of a year he slowly gained his functional status and was able to walk.  He says he did not need oxygen.  He has a sacral decub that is still present.  He did see his rheumatologist most recently on July 19, 2019 at [facility] in [place] at this point in time he is only been on Plaquenil for over a year.  He not been on immunomodulator.  In the past he has had Imuran and sulfasalazine and had failed those..  At this visit because of pain is rheumatologist the rheumatologist gave 10 days of prednisone taper.  He tells me August 03, 2019 he was discharged from a nursing home to his home and then abruptly in the last 1 day he got acutely short of breath and presented to the ER.  He was initially requiring 6 L of oxygen.  Currently now needing 2 L of oxygen.  He says he is feeling fine otherwise.  No obvious edema.  He is not on anticoagulation.  Pulmonary embolism has not been ruled out this admission he did have lactic acidosis and a slightly elevated high-sensitivity troponin.   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 At last visit contemplating starting CellCept. He returns to permit function test that shows continued stability/improvement of his FVC by in talking to him and his wife it appears that overall his functional status has declined. He has admissions for sepsis which are noted in the review of the chart as well. He was discharged onJune 11, 2019 to [facility]. The wife is upset they're not doingin a physical therapy. She says she is so deconditioned he is not able to get out of the chair. His ECOG is 4. In fact today's having some worsening shivering. But they refused to go to the ER. She also admitted to the fact that he is not increased pallor and some discoloration in his shin.recent labs are documented below  Results for KEANEN, DOHSE (MRN [id number]) as of 04/29/2018 17:07  Ref. Range 02/25/2017 15:45 05/27/2017 15:27 12/17/2017 10:26 04/29/2018 15:47  FVC-Pre Latest Units: L 2.57 2.85 2.86 2.88  FVC-%Pred-Pre Latest Units: % 49 54 57 58  Results for RONON, FERGER (MRN [id number]) as of 04/29/2018 17:07  Ref. Range 02/25/2017 15:45 05/27/2017 15:27 12/17/2017 10:26 04/29/2018 15:47  DLCO unc Latest Units: ml/min/mmHg 11.65 13.66 11.16 12.18  DLCO unc % pred Latest Units: % 32 37 32 35   Results for Janowicz, Hawke W (  MRN [id number]) as of 04/29/2018 17:07  Ref. Range 04/12/2018 05:31  Creatinine Latest Ref Range: 0.61 - 1.24 mg/dL 4.09   Results for BHUVAN, VILLAGRAN (MRN [id number]) as of 08/07/2019 17:26  Ref. Range 02/25/2017 07:33  ANA Ab, IFA Unknown Negative  ANCA Proteinase 3 Latest Ref Range: 0.0 - 3.5 U/mL <3.5  Anti JO-1 Latest Ref Range: 0.0 - 0.9 AI <0.2  CCP Antibodies IgG/IgA Latest Ref Range: 0 - 19 units 36 (H)  DNA-Histone Latest Ref Range: 0.0 - 0.9 Units 0.8  Myeloperoxidase Abs Latest Ref Range: 0.0 - 9.0 U/mL <9.0  RA Latex Turbid. Latest Ref Range: 0.0 - 13.9 IU/mL >650.0 (H)  Cytoplasmic  (C-ANCA) Latest Ref Range: Neg:<1:20 titer <1:20  P-ANCA Latest Ref Range: Neg:<1:20 titer <1:20  Atypical P-ANCA titer Latest Ref Range: Neg:<1:20 titer <1:20  Scleroderma (Scl-70) (ENA) Antibody, IgG Latest Ref Range: 0.0 - 0.9 AI <0.2    Past Medical History     has a past medical history of AAA (abdominal aortic aneurysm) (HCC), AKI (acute kidney injury) (HCC), Arthritis, CAD in native artery, Carotid artery disease (HCC), Chronic combined systolic and diastolic CHF (congestive heart failure) (HCC), COPD (chronic obstructive pulmonary disease) (HCC), GERD (gastroesophageal reflux disease), Hyperkalemia, ILD (interstitial lung disease) (HCC), Lung nodule, MI (myocardial infarction) (HCC), Normocytic anemia, NSVT (nonsustained ventricular tachycardia) (HCC), Pancreatitis, Pneumonia (11/2017), RA (rheumatoid arthritis) (HCC), Stroke (HCC), and Tobacco abuse.   reports that he has been smoking cigarettes. He has a 47.00 pack-year smoking history. He has never used smokeless tobacco.  Past Surgical History:  Procedure Laterality Date  . CARDIAC CATHETERIZATION    . CAROTID ENDARTERECTOMY Left   . Heart Bypass  10/1992  . INCISION AND DRAINAGE PERIRECTAL ABSCESS Right 05/09/2018   Procedure: IRRIGATION AND DEBRIDEMENT  RIGHT BUTTOCK ABSCESS;  Surgeon: Darnell Level, MD;  Location: [facility];  Service: General;  Laterality: Right;  . LEFT HEART CATH AND CORS/GRAFTS ANGIOGRAPHY N/A 03/01/2017   Procedure: Left Heart Cath and Cors/Grafts Angiography;  Surgeon: Lyn Records, MD;  Location: [facility] INVASIVE CV LAB;  Service: Cardiovascular;  Laterality: N/A;  . Throat Biopsy     Cat scratch fever    No Known Allergies  Immunization History  Administered Date(s) Administered  . Influenza,inj,Quad PF,6+ Mos 11/02/2016, 09/27/2017  . Pneumococcal Polysaccharide-23 04/09/2018  . Pneumococcal-Unspecified 11/02/2016  . Tdap 03/09/2017    Family History  Problem Relation Age of Onset  . Heart murmur  Mother   . Heart disease Mother   . Heart attack Mother   . Suicidality Father   . Heart attack Father   . Stroke Brother   . Heart attack Brother   . Heart attack Maternal Grandmother   . Cancer Maternal Grandmother        unknown type  . Lung cancer Maternal Aunt   . Lung cancer Maternal Uncle   . Cancer Maternal Aunt        unknown cancer  . Heart attack Brother      Current Facility-Administered Medications:  .  albuterol (PROVENTIL) (2.5 MG/3ML) 0.083% nebulizer solution 2.5 mg, 2.5 mg, Nebulization, Q2H PRN, Jonah Blue, MD .  aspirin EC tablet 81 mg, 81 mg, Oral, Daily, Jonah Blue, MD, 81 mg at 08/07/19 1121 .  atorvastatin (LIPITOR) tablet 80 mg, 80 mg, Oral, q1800, Jonah Blue, MD .  azithromycin (ZITHROMAX) 500 mg in sodium chloride 0.9 % 250 mL IVPB, 500 mg, Intravenous, Q24H, Jonah Blue, MD, Stopped at 08/07/19 1439 .  buPROPion (WELLBUTRIN SR) 12 hr tablet 100 mg, 100 mg, Oral, BID, Jonah BlueYates, Jennifer, MD .  carvedilol (COREG) tablet 6.25 mg, 6.25 mg, Oral, BID WC, Jonah BlueYates, Jennifer, MD .  cefTRIAXone (ROCEPHIN) 2 g in sodium chloride 0.9 % 100 mL IVPB, 2 g, Intravenous, Q24H, Jonah BlueYates, Jennifer, MD, Stopped at 08/07/19 1439 .  Chlorhexidine Gluconate Cloth 2 % PADS 6 each, 6 each, Topical, Daily, Midge MiniumKakrakandy, Arshad N, MD .  enoxaparin (LOVENOX) injection 40 mg, 40 mg, Subcutaneous, Q24H, Jonah BlueYates, Jennifer, MD, 40 mg at 08/07/19 1115 .  gabapentin (NEURONTIN) capsule 300 mg, 300 mg, Oral, Q8H, Jonah BlueYates, Jennifer, MD, 300 mg at 08/07/19 1128 .  guaiFENesin (MUCINEX) 12 hr tablet 600 mg, 600 mg, Oral, BID PRN, Jonah BlueYates, Jennifer, MD .  HYDROcodone-acetaminophen [facility](NORCO) 10-325 MG per tablet 1 tablet, 1 tablet, Oral, TID, Jonah BlueYates, Jennifer, MD, 1 tablet at 08/07/19 1121 .  hydrocortisone sodium succinate (SOLU-CORTEF) 100 MG injection 50 mg, 50 mg, Intravenous, Q6H, Jonah BlueYates, Jennifer, MD, 50 mg at 08/07/19 1113 .  hydroxychloroquine (PLAQUENIL) tablet 200 mg, 200 mg, Oral, BID,  Jonah BlueYates, Jennifer, MD .  ipratropium-albuterol (DUONEB) 0.5-2.5 (3) MG/3ML nebulizer solution 3 mL, 3 mL, Nebulization, Q6H, Jonah BlueYates, Jennifer, MD .  magnesium hydroxide (MILK OF MAGNESIA) suspension 30 mL, 30 mL, Oral, Daily PRN, Jonah BlueYates, Jennifer, MD .  mirtazapine (REMERON) tablet 7.5 mg, 7.5 mg, Oral, QHS, Jonah BlueYates, Jennifer, MD .  multivitamin with minerals tablet 1 tablet, 1 tablet, Oral, Daily, Jonah BlueYates, Jennifer, MD, 1 tablet at 08/07/19 1121 .  nicotine (NICODERM CQ - dosed in mg/24 hr) patch 7 mg, 7 mg, Transdermal, Daily, Jonah BlueYates, Jennifer, MD .  nutrition supplement (JUVEN) (JUVEN) powder packet 1 packet, 1 packet, Oral, BID BM, Jonah BlueYates, Jennifer, MD, 1 packet at 08/07/19 1115 .  polyethylene glycol (MIRALAX / GLYCOLAX) packet 17 g, 17 g, Oral, Daily, Jonah BlueYates, Jennifer, MD .  saccharomyces boulardii (FLORASTOR) capsule 250 mg, 250 mg, Oral, BID, Jonah BlueYates, Jennifer, MD, 250 mg at 08/07/19 1115 .  tamsulosin (FLOMAX) capsule 0.4 mg, 0.4 mg, Oral, QPC supper, Jonah BlueYates, Jennifer, MD   SUBJECTIVE/OVERNIGHT/INTERVAL HX    08/07/2019 - ccm consult  Objective   Blood pressure 113/67, pulse 85, temperature (!) 97.5 F (36.4 C), temperature source Axillary, resp. rate (!) 35, height 6\' 1"  (1.854 m), weight 70.3 kg, SpO2 97 %.       No intake or output data in the 24 hours ending 08/07/19 1712 Filed Weights   08/07/19 0402  Weight: 70.3 kg    Examination: General: Frail elderly male lying in bed looks comfortable HENT: No neck nodes no elevated JVP Lungs: Bilateral crackles consistent with Velcro crackles of UIP Cardiovascular: Regular rate and rhythm no murmurs Abdomen: Soft nontender no organomegaly Extremities: No cyanosis no clubbing no edema Neuro: Alert and oriented x3.  Speech normal GU: Not examined  Resolved Hospital Problem list   X  Assessment & Plan:  Acute hypoxemic respiratory failure superimposed on chronic ILD/UIP secondary to rheumatoid arthritis COVID-19 ruled out  Differential  diagnosis includes acute pulmonary embolism following discharge from the nursing home Differential diagnosis also involves acute on chronic heart failure although he looks euvolemic Other respiratory virus exacerbations also in the differential diagnosis Interstitial lung disease flare although this would be highly unusual given the rapid decline  Plan -High-resolution CT chest to evaluate his ILD compared to 1 year ago -CT angiogram chest to rule out pulmonary embolism - empiric IV heparin - check PCT, urine strep, urine legionella   Ccm wil follow  Best practice:  Per [facility]  LABS    PULMONARY No results for input(s): PHART, PCO2ART, PO2ART, HCO3, TCO2, O2SAT in the last 168 hours.  Invalid input(s): PCO2, PO2  CBC Recent Labs  Lab 08/07/19 0405  HGB 13.3  HCT 41.8  WBC 9.9  PLT 240    COAGULATION No results for input(s): INR in the last 168 hours.  CARDIAC  No results for input(s): TROPONINI in the last 168 hours. No results for input(s): PROBNP in the last 168 hours.   CHEMISTRY Recent Labs  Lab 08/07/19 0405  NA 136  K 3.9  CL 103  CO2 19*  GLUCOSE 140*  BUN 10  CREATININE 0.93  CALCIUM 8.3*   Estimated Creatinine Clearance: 80.8 mL/min (by C-G formula based on SCr of 0.93 mg/dL).   LIVER Recent Labs  Lab 08/07/19 0405  AST 19  ALT 15  ALKPHOS 79  BILITOT 1.7*  PROT 7.2  ALBUMIN 2.4*     INFECTIOUS Recent Labs  Lab 08/07/19 0405 08/07/19 0623  LATICACIDVEN 2.7* 2.0*     ENDOCRINE CBG (last 3)  No results for input(s): GLUCAP in the last 72 hours.       IMAGING x48h  - image(s) personally visualized  -   highlighted in bold Dg Chest Port 1 View  Result Date: 08/07/2019 CLINICAL DATA:  Shortness of breath EXAM: PORTABLE CHEST 1 VIEW COMPARISON:  06/04/2018 FINDINGS: Generalized coarse interstitial opacity. Cardiomegaly. CABG. No acute finding when compared to prior. The PICC has been removed. IMPRESSION: 1. Pulmonary  fibrosis with stable aeration compared to study last month. Pneumonia could easily be obscured given the extent of chronic lung disease. 2. Cardiomegaly. Electronically Signed   By: Monte Fantasia M.D.   On: 08/07/2019 05:22

## 2019-08-07 NOTE — ED Triage Notes (Signed)
Pt from home, developed a fever 102.8, non-productive cough, sob, general pain, occasional chest pain, weakness X1 day.  Was 88% on RA (no home O2) returned to 94%.  Given an neb treatment, epi IM, 125 Solumedrol.  Breathing improved.

## 2019-08-07 NOTE — ED Notes (Signed)
Meal ordered

## 2019-08-07 NOTE — ED Provider Notes (Addendum)
[facility] Provider Note   CSN: [id number] Arrival date & time: 08/07/19  0340     History   Chief Complaint Chief Complaint  Patient presents with   Shortness of Breath   Fever    HPI [name] is a 63 y.o. male.     Patient is a 63 year old male with extensive past medical history including coronary artery disease with CABG in 1993, chronic renal insufficiency, abdominal aortic aneurysm, COPD.  Patient presents today for evaluation of shortness of breath.  This began yesterday and is worsening.  Patient denies cough or chest..  He denies fevers or chills.  Patient was recently in a nursing facility for rehabilitation from a prior hospitalization.  Patient was hypoxic when EMS arrived and was placed on oxygen by nasal cannula.  The history is provided by the patient.  Shortness of Breath Severity:  Moderate Onset quality:  Sudden Duration:  2 days Timing:  Constant Progression:  Worsening Chronicity:  New Context: activity   Relieved by:  Nothing Worsened by:  Activity Ineffective treatments:  None tried Associated symptoms: fever   Fever   Past Medical History:  Diagnosis Date   AAA (abdominal aortic aneurysm) (HCC)    AKI (acute kidney injury) (HCC)    a. Cr up to 8 in 04/2018.   Arthritis    CAD in native artery    a. h/o MI x 3 s/p CABG x 4 (1993 with Tyrone SageGerhardt). b. LHC 01/2017 with severe native disease, 2 grafts occluded.   Carotid artery disease (HCC)    a. s/p R CEA 11/2016.   Chronic combined systolic and diastolic CHF (congestive heart failure) (HCC)    COPD (chronic obstructive pulmonary disease) (HCC)    GERD (gastroesophageal reflux disease)    Hyperkalemia    ILD (interstitial lung disease) (HCC)    Lung nodule    MI (myocardial infarction) (HCC)    x 3    Normocytic anemia    NSVT (nonsustained ventricular tachycardia) (HCC)    Pancreatitis    1/19   Pneumonia 11/2017   RA  (rheumatoid arthritis) (HCC)    Stroke (HCC)    a. 2018. b. possible recurrence in 04/2018, seen by neuro   Tobacco abuse     Patient Active Problem List   Diagnosis Date Noted   Acute osteomyelitis of pelvic region and thigh, right (HCC) 08/04/2018   Pressure injury of skin 06/06/2018   Complicated UTI (urinary tract infection)    Cholecystitis 06/04/2018   Severe sepsis (HCC)    NSVT (nonsustained ventricular tachycardia) (HCC) 05/14/2018   Adrenal insufficiency (HCC): Relative 05/11/2018   Abscess of buttock 05/11/2018   AKI (acute kidney injury) (HCC)    Urinary retention    Rheumatoid arthritis (HCC)    Chronic obstructive pulmonary disease (HCC)    Supplemental oxygen dependent    Tobacco abuse    AAA (abdominal aortic aneurysm) without rupture (HCC)    History of CVA with residual deficit    Acute blood loss anemia    Hyperkalemia    Cerebral thrombosis with cerebral infarction 04/11/2018   Sacral ulcer, with unspecified severity (HCC) 04/05/2018   Pressure injury of skin of sacral region    Severe comorbid illness    Acute renal failure (HCC)    Shock (HCC) 04/04/2018   High anion gap metabolic acidosis 04/04/2018   Acute kidney injury (HCC) 04/04/2018   Cervicalgia 01/18/2018   Chronic pain of both shoulders 01/18/2018  Hx of rheumatoid arthritis 01/18/2018   Chronic pain syndrome 01/18/2018   Leukocytosis 12/15/2017   Cough    Pneumonia 11/01/2017   Elevated lipase    Elevated liver enzymes    Acute pancreatitis 08/08/2017   Carotid artery disease (HCC) 05/12/2017   Cyclic citrullinated peptide (CCP) antibody positive 04/01/2017   Chronic combined systolic and diastolic CHF (congestive heart failure) (HCC) 03/07/2017   S/P CABG (coronary artery bypass graft)    Pure hypercholesterolemia    Acute combined systolic and diastolic heart failure (HCC)    Exertional shortness of breath    Solitary pulmonary nodule      CAD (coronary artery disease) 02/24/2017   ILD (interstitial lung disease) (HCC) 02/24/2017   Anemia 02/24/2017   AAA (abdominal aortic aneurysm) (HCC) 02/24/2017   Abnormal EKG 02/24/2017   Acute respiratory failure with hypoxia (HCC) 02/24/2017   History of stroke 02/24/2017   HTN (hypertension) 02/24/2017   Current smoker 02/24/2017    Past Surgical History:  Procedure Laterality Date   CARDIAC CATHETERIZATION     CAROTID ENDARTERECTOMY Left    Heart Bypass  10/1992   INCISION AND DRAINAGE PERIRECTAL ABSCESS Right 05/09/2018   Procedure: IRRIGATION AND DEBRIDEMENT  RIGHT BUTTOCK ABSCESS;  Surgeon: Darnell Level, MD;  Location: [facility] ORS;  Service: General;  Laterality: Right;   LEFT HEART CATH AND CORS/GRAFTS ANGIOGRAPHY N/A 03/01/2017   Procedure: Left Heart Cath and Cors/Grafts Angiography;  Surgeon: Lyn Records, MD;  Location: [facility] INVASIVE CV LAB;  Service: Cardiovascular;  Laterality: N/A;   Throat Biopsy     Cat scratch fever        Home Medications    Prior to Admission medications   Medication Sig Start Date End Date Taking? Authorizing Provider  acetaminophen (TYLENOL) 500 MG tablet Take 1,000 mg by mouth every 6 (six) hours as needed for mild pain.     [provider]  albuterol (PROVENTIL) (2.5 MG/3ML) 0.083% nebulizer solution Take 2.5 mg by nebulization every 6 (six) hours as needed for wheezing or shortness of breath.    [provider]  Amino Acids-Protein Hydrolys (FEEDING SUPPLEMENT, PRO-STAT SUGAR FREE 64,) LIQD Take 30 mLs by mouth 2 (two) times daily. 05/18/18   Rodolph Bong, MD  aspirin EC 81 MG tablet Take by mouth. 12/03/16   [provider]  atorvastatin (LIPITOR) 40 MG tablet Take 1 tablet (40 mg total) by mouth daily at 6 PM. Patient taking differently: Take 80 mg by mouth daily at 6 PM.  03/15/17   Dunn, Tacey Ruiz, PA-C  carvedilol (COREG) 6.25 MG tablet Take 1 tablet (6.25 mg total) by mouth 2 (two) times daily  with a meal. 05/18/18 08/15/18  Rodolph Bong, MD  cephALEXin (KEFLEX) 500 MG capsule Take 500 mg by mouth 3 (three) times daily.    [provider]  clopidogrel (PLAVIX) 75 MG tablet Take 75 mg by mouth daily.    [provider]  enoxaparin (LOVENOX) 40 MG/0.4ML injection Inject 0.4 mLs (40 mg total) into the skin daily. 05/19/18   Rodolph Bong, MD  furosemide (LASIX) 20 MG tablet Take by mouth. 02/07/18   [provider]  gabapentin (NEURONTIN) 300 MG capsule Take 1 capsule (300 mg total) by mouth every 8 (eight) hours. 01/18/18   Edward Jolly, MD  HYDROcodone-acetaminophen (NORCO) 10-325 MG tablet Take 1 tablet by mouth 3 (three) times daily. 06/07/18   Shon Hale, MD  hydroxychloroquine (PLAQUENIL) 200 MG tablet Take 1 tablet (  200 mg total) by mouth 2 (two) times daily. 06/22/18   Eugenie Filler, MD  ipratropium-albuterol (DUONEB) 0.5-2.5 (3) MG/3ML SOLN Take 3 mLs by nebulization 2 (two) times daily. 05/18/18   Eugenie Filler, MD  magnesium hydroxide (MILK OF MAGNESIA) 400 MG/5ML suspension Take 30 mLs by mouth daily as needed for mild constipation. And QHS    [provider]  Menthol-Methyl Salicylate (MUSCLE RUB EX) Apply 1 application topically as needed (for shoulder or knee pain).     [provider]  mirtazapine (REMERON) 7.5 MG tablet Take 7.5 mg by mouth at bedtime.    [provider]  morphine (MSIR) 30 MG tablet Take 1 tablet (30 mg total) by mouth every 4 (four) hours as needed (breakthrough pain). 06/07/18   Roxan Hockey, MD  Multiple Vitamin (MULTIVITAMIN WITH MINERALS) TABS tablet Take 1 tablet by mouth daily. 05/19/18   Eugenie Filler, MD  nicotine (NICODERM CQ - DOSED IN MG/24 HR) 7 mg/24hr patch Place 1 patch (7 mg total) onto the skin daily. 04/13/18   Elodia Florence., MD  nutrition supplement, JUVEN, (JUVEN) PACK Take 1 packet by mouth 2 (two) times daily between meals. 05/18/18   Eugenie Filler, MD  nystatin cream (MYCOSTATIN) Apply topically 2 (two) times daily. Patient taking differently: Apply 1 application topically 2 (two) times daily.  05/18/18   Eugenie Filler, MD  ondansetron (ZOFRAN) 4 MG tablet Take 1 tablet (4 mg total) by mouth every 6 (six) hours as needed for nausea. 06/07/18   Roxan Hockey, MD  polyethylene glycol (MIRALAX / GLYCOLAX) packet Take 17 g by mouth 2 (two) times daily. Until having 1 soft BM daily, then can decrease to daily or as needed Patient taking differently: Take 17 g by mouth daily. Until having 1 soft BM daily, then can decrease to daily or as needed 04/12/18   Elodia Florence., MD  potassium chloride SA (K-DUR,KLOR-CON) 20 MEQ tablet Take 2 tablets (40 mEq total) by mouth daily. Patient taking differently: Take 20 mEq by mouth daily.  05/19/18   Eugenie Filler, MD  predniSONE (DELTASONE) 10 MG tablet Take 10 mg by mouth daily with breakfast.    [provider]  PROVENTIL HFA 108 (90 Base) MCG/ACT inhaler INHALE 2 PUFFS EVERY 6 HOURS AS NEEDED FOR WHEEZE/SHORTNESS OF BREATH 03/29/18   Brand Males, MD  saccharomyces boulardii (FLORASTOR) 250 MG capsule Take 1 capsule (250 mg total) by mouth 2 (two) times daily. 05/18/18   Eugenie Filler, MD  sacubitril-valsartan (ENTRESTO) 49-51 MG Take by mouth. 01/14/18   [provider]  tamsulosin (FLOMAX) 0.4 MG CAPS capsule Take 1 capsule (0.4 mg total) by mouth daily after supper. 06/07/18   Emokpae, Courage, MD  UNABLE TO FIND Take 90 mLs by mouth 2 (two) times daily. Medpass    [provider]    Family History Family History  Problem Relation Age of Onset   Heart murmur Mother    Heart disease Mother    Heart attack Mother    Suicidality Father    Heart attack Father    Stroke Brother    Heart attack Brother    Heart attack Maternal Grandmother    Cancer Maternal Grandmother        unknown type   Lung cancer Maternal Aunt    Lung cancer  Maternal Uncle    Cancer Maternal Aunt        unknown cancer  Heart attack Brother     Social History Social History   Tobacco Use   Smoking status: Current Every Day Smoker    Packs/day: 0.50    Years: 47.00    Pack years: 23.50    Types: Cigarettes   Smokeless tobacco: Never Used   Tobacco comment: half pack every other day  Substance Use Topics   Alcohol use: No    Comment: remote history of beer drinking   Drug use: No     Allergies   Patient has no known allergies.   Review of Systems Review of Systems  Constitutional: Positive for fever.  Respiratory: Positive for shortness of breath.   All other systems reviewed and are negative.    Physical Exam Updated Vital Signs BP 130/71    Pulse (!) 120    Temp 98.5 F (36.9 C) (Oral)    Resp (!) 34    Ht 6\' 1"  (1.854 m)    Wt 70.3 kg    SpO2 97%    BMI 20.45 kg/m   Physical Exam Vitals signs and nursing note reviewed.  Constitutional:      General: He is not in acute distress.    Appearance: He is well-developed. He is not diaphoretic.     Comments: Patient is a 63 year old male who appears older than stated age.  He becomes dyspneic with speaking and appears in mild respiratory distress.  HENT:     Head: Normocephalic and atraumatic.  Neck:     Musculoskeletal: Normal range of motion and neck supple.  Cardiovascular:     Rate and Rhythm: Normal rate and regular rhythm.     Heart sounds: No murmur. No friction rub.  Pulmonary:     Effort: Tachypnea present. No respiratory distress.     Breath sounds: Examination of the right-middle field reveals rhonchi. Examination of the left-middle field reveals rhonchi. Rhonchi present. No wheezing or rales.     Comments: Patient is tachypneic and appears in mild respiratory distress. Abdominal:     General: Bowel sounds are normal. There is no distension.     Palpations: Abdomen is soft.     Tenderness: There is no abdominal tenderness.  Musculoskeletal: Normal  range of motion.     Right lower leg: He exhibits no tenderness. No edema.     Left lower leg: He exhibits no tenderness. No edema.     Comments: There is no calf tenderness and Homans sign is absent bilaterally.  Skin:    General: Skin is warm and dry.  Neurological:     Mental Status: He is alert and oriented to person, place, and time.     Coordination: Coordination normal.      ED Treatments / Results  Labs (all labs ordered are listed, but only abnormal results are displayed) Labs Reviewed  SARS CORONAVIRUS 2 (HOSPITAL ORDER, PERFORMED IN [facility])  COMPREHENSIVE METABOLIC PANEL  BRAIN NATRIURETIC PEPTIDE  LACTIC ACID, PLASMA  LACTIC ACID, PLASMA  CBC WITH DIFFERENTIAL/PLATELET  TROPONIN I (HIGH SENSITIVITY)    EKG EKG Interpretation  Date/Time:  Monday August 07 2019 03:56:17 EDT Ventricular Rate:  125 PR Interval:    QRS Duration: 126 QT Interval:  338 QTC Calculation: 488 R Axis:   31 Text Interpretation:  Sinus tachycardia Multiform ventricular premature complexes Nonspecific intraventricular conduction delay Repol abnrm suggests ischemia, diffuse leads Artifact in lead(s) I II aVR aVL aVF V3 No significant change since 05/09/2018 Confirmed by 07/10/2018 (Geoffery Lyons) on 08/07/2019  4:22:55 AM   Radiology No results found.  Procedures Procedures (including critical care time)  Medications Ordered in ED Medications  albuterol (VENTOLIN HFA) 108 (90 Base) MCG/ACT inhaler 6 puff (has no administration in time range)     Initial Impression / Assessment and Plan / ED Course  I have reviewed the triage vital signs and the nursing notes.  Pertinent labs & imaging results that were available during my care of the patient were reviewed by me and considered in my medical decision making (see chart for details).  Patient presenting here with complaints of shortness of breath.  He was initially said to be febrile with EMS with a temp to 102.8.  Patient  does have an oxygen requirement and was placed on supplemental O2 by EMS.  Patient arrived here in mild respiratory distress.  He became tachypneic and dyspneic with speaking.  He is requiring 6 L of oxygen by nasal cannula to maintain saturations in the mid 90s.  Work-up shows a mildly elevated BNP, no elevation of white count, and laboratory studies which are otherwise unremarkable.  His chest x-ray does show pulmonary fibrosis, stable compared to last month, however pneumonia could possibly be obscured given the extent of his chronic lung disease.  With the patient describing cough and fever at home, I suspect the cause of his dyspnea is pneumonia.  Due to the fact he was recently in an extended care facility, he will be treated for HCAP with vancomycin and Zosyn.  He will be admitted to the hospitalist service under the care of Dr. Toniann FailKakrakandy.  CRITICAL CARE Performed by: Geoffery Lyonsouglas Story Vanvranken Total critical care time: 35 minutes Critical care time was exclusive of separately billable procedures and treating other patients. Critical care was necessary to treat or prevent imminent or life-threatening deterioration. Critical care was time spent personally by me on the following activities: development of treatment plan with patient and/or surrogate as well as nursing, discussions with consultants, evaluation of patient's response to treatment, examination of patient, obtaining history from patient or surrogate, ordering and performing treatments and interventions, ordering and review of laboratory studies, ordering and review of radiographic studies, pulse oximetry and re-evaluation of patient's condition.   Final Clinical Impressions(s) / ED Diagnoses   Final diagnoses:  None    ED Discharge Orders    None       Geoffery Lyonselo, Allin Frix, MD 08/07/19 [id number]    Geoffery Lyonselo, Jasmin Winberry, MD 08/17/19 1544

## 2019-08-07 NOTE — Progress Notes (Addendum)
ANTICOAGULATION CONSULT NOTE - Initial Consult  Pharmacy Consult for Heparin Indication: pulmonary embolus  No Known Allergies  Patient Measurements: Height: 6\' 1"  (185.4 cm) Weight: 155 lb (70.3 kg) IBW/kg (Calculated) : 79.9 Heparin Dosing Weight: 70.3 kg  Vital Signs: Temp: 97.5 F (36.4 C) (10/05 1710) Temp Source: Axillary (10/05 1710) BP: 113/67 (10/05 1345) Pulse Rate: 85 (10/05 1345)  Labs: Recent Labs    08/07/19 0405 08/07/19 0623  HGB 13.3  --   HCT 41.8  --   PLT 240  --   CREATININE 0.93  --   TROPONINIHS 29* 36*    Estimated Creatinine Clearance: 80.8 mL/min (by C-G formula based on SCr of 0.93 mg/dL).   Medical History: Past Medical History:  Diagnosis Date  . AAA (abdominal aortic aneurysm) ([place])   . AKI (acute kidney injury) ([place])    a. Cr up to 8 in 04/2018.  . Arthritis   . CAD in native artery    a. h/o MI x 3 s/p CABG x 4 (1993 with Servando Snare). b. LHC 01/2017 with severe native disease, 2 grafts occluded.  . Carotid artery disease ([place])    a. s/p R CEA 11/2016.  [name] Chronic combined systolic and diastolic CHF (congestive heart failure) ([place])   . COPD (chronic obstructive pulmonary disease) ([place])   . GERD (gastroesophageal reflux disease)   . Hyperkalemia   . ILD (interstitial lung disease) ([place])   . Lung nodule   . MI (myocardial infarction) ([place])    x 3   . Normocytic anemia   . NSVT (nonsustained ventricular tachycardia) ([place])   . Pancreatitis    1/19  . Pneumonia 11/2017  . RA (rheumatoid arthritis) ([place])   . Stroke ([place])    a. 2018. b. possible recurrence in 04/2018, seen by neuro  . Tobacco abuse     Medications:  No anticoagulants prior to admission  Assessment: 63 year old male with known interstitial lung disease related to rheumatoid arthritis admitted for acute on chronic hypoxemic respiratory failure. Pharmacy consulted to dose IV Heparin for possible acute pulmonary embolism. CT Angio pending.   CBC is within normal  limits. Patient received Lovenox 40 mg at 1115 AM today (8 hours prior)- will give a reduced bolus. SCr 0.93 with estimated CrCl ~ 81 mL/min.   Goal of Therapy:  Heparin level 0.3-0.7 units/ml Monitor platelets by anticoagulation protocol: Yes   Plan:  Give 3500 units bolus x 1 Start heparin infusion at 1250 units/hr Check anti-Xa level in 6 hours and daily while on heparin Continue to monitor H&H and platelets Follow-up CT Angio - orders per protocol to stop heparin if this is negative.   Sloan Leiter, PharmD, BCPS, BCCCP Clinical Pharmacist Clinical phone 08/07/2019 until 11PM [phone] Please refer to AMION for [place] numbers 08/07/2019,6:41 PM

## 2019-08-07 NOTE — ED Notes (Signed)
Heart healthy lunch tray ordered 

## 2019-08-08 ENCOUNTER — Inpatient Hospital Stay (HOSPITAL_COMMUNITY): Payer: Medicaid Other

## 2019-08-08 ENCOUNTER — Telehealth (INDEPENDENT_AMBULATORY_CARE_PROVIDER_SITE_OTHER): Payer: Self-pay

## 2019-08-08 DIAGNOSIS — I340 Nonrheumatic mitral (valve) insufficiency: Secondary | ICD-10-CM

## 2019-08-08 LAB — BASIC METABOLIC PANEL
Anion gap: 8 (ref 5–15)
BUN: 20 mg/dL (ref 8–23)
CO2: 24 mmol/L (ref 22–32)
Calcium: 8.3 mg/dL — ABNORMAL LOW (ref 8.9–10.3)
Chloride: 106 mmol/L (ref 98–111)
Creatinine, Ser: 0.94 mg/dL (ref 0.61–1.24)
GFR calc Af Amer: >60 mL/min (ref >60)
GFR calc non Af Amer: >60 mL/min (ref >60)
Glucose, Bld: 172 mg/dL — ABNORMAL HIGH (ref 70–99)
Potassium: 4 mmol/L (ref 3.5–5.1)
Sodium: 138 mmol/L (ref 135–145)

## 2019-08-08 LAB — ECHOCARDIOGRAM COMPLETE
Height: 73 in
Weight: 2480 oz

## 2019-08-08 LAB — CBC WITH DIFFERENTIAL/PLATELET
Abs Immature Granulocytes: 0.04 10*3/uL (ref 0.00–0.07)
Basophils Absolute: 0 10*3/uL (ref 0.0–0.1)
Basophils Relative: 0 %
Eosinophils Absolute: 0 10*3/uL (ref 0.0–0.5)
Eosinophils Relative: 0 %
HCT: 33.9 % — ABNORMAL LOW (ref 39.0–52.0)
Hemoglobin: 10.7 g/dL — ABNORMAL LOW (ref 13.0–17.0)
Immature Granulocytes: 1 %
Lymphocytes Relative: 15 %
Lymphs Abs: 0.8 10*3/uL (ref 0.7–4.0)
MCH: 26 pg (ref 26.0–34.0)
MCHC: 31.6 g/dL (ref 30.0–36.0)
MCV: 82.3 fL (ref 80.0–100.0)
Monocytes Absolute: 0.3 10*3/uL (ref 0.1–1.0)
Monocytes Relative: 6 %
Neutro Abs: 4.3 10*3/uL (ref 1.7–7.7)
Neutrophils Relative %: 78 %
Platelets: 195 10*3/uL (ref 150–400)
RBC: 4.12 MIL/uL — ABNORMAL LOW (ref 4.22–5.81)
RDW: 17.2 % — ABNORMAL HIGH (ref 11.5–15.5)
WBC: 5.5 10*3/uL (ref 4.0–10.5)
nRBC: 0 % (ref 0.0–0.2)

## 2019-08-08 LAB — STREP PNEUMONIAE URINARY ANTIGEN: Strep Pneumo Urinary Antigen: NEGATIVE

## 2019-08-08 LAB — PROCALCITONIN: Procalcitonin: <0.1 ng/mL

## 2019-08-08 LAB — LACTIC ACID, PLASMA
Lactic Acid, Venous: 1.4 mmol/L (ref 0.5–1.9)
Lactic Acid, Venous: 2 mmol/L (ref 0.5–1.9)

## 2019-08-08 MED ORDER — METHYLPREDNISOLONE SODIUM SUCC 125 MG IJ SOLR
60.0000 mg | Freq: Two times a day (BID) | INTRAMUSCULAR | Status: DC
  Administered 2019-08-08 – 2019-08-09 (×3): 60 mg via INTRAVENOUS
  Filled 2019-08-08 (×3): qty 2

## 2019-08-08 MED ORDER — ENOXAPARIN SODIUM 40 MG/0.4ML ~~LOC~~ SOLN
40.0000 mg | SUBCUTANEOUS | Status: DC
  Administered 2019-08-08 – 2019-08-12 (×5): 40 mg via SUBCUTANEOUS
  Filled 2019-08-08 (×5): qty 0.4

## 2019-08-08 MED ORDER — SODIUM CHLORIDE 0.9 % IV SOLN
INTRAVENOUS | Status: DC
  Administered 2019-08-08: 13:00:00 via INTRAVENOUS

## 2019-08-08 NOTE — Telephone Encounter (Signed)
Sofie Rower from [facility] called to request verbal orders for home health 1x a week for 1 week  3x a week for 2 weeks and  2x a week for 5 weeks.  Front desk gave verbal. Joelene Millin stated if PCP wanted to change the authorization for verbal orders she could be reached at [phone].   Thank you Whitney Post

## 2019-08-08 NOTE — Telephone Encounter (Signed)
Left message for Sonia Baller, that no verbal orders can be given until patient presents for OV on 10/14. Nat Christen, CMA

## 2019-08-08 NOTE — Progress Notes (Signed)
Wound care products unable to found in [place] by name or product number. Pages attempted to number provided at 1200, 1313 and 1409 today. Alternate WOC number attempted at 8682 and 1630. Provided wound care with soap and water as directed. Applied foam dressing.

## 2019-08-08 NOTE — Consult Note (Signed)
[facility] Nurse wound consult note Patient receiving care in [place].  I have sent the following SecureChat message to Dr. Loann Quill, Attending according to EPIC:  "Good morning. I would like to help with the [facility] consult for the buttock wound. To do this I need to ask for a photo to be placed in the record. Would you be willing to do this to document it's current condition? Thank you, Claudina Lick nurse."  Val Riles, RN, MSN, [facility], CNS-BC, pager [phone]

## 2019-08-08 NOTE — Progress Notes (Signed)
Notified of critical lactic acid level. MD notified. New orders given. NS started at 268mls/hour.

## 2019-08-08 NOTE — Telephone Encounter (Signed)
Called and spoke with [facility] and informed that until patient presents for scheduled OV on 10/14 no verbal orders can be given per PCP. Nat Christen, CMA

## 2019-08-08 NOTE — Progress Notes (Signed)
PROGRESS NOTE    [name]  IOE:[id number] DOB: 1955/11/22 DOA: 08/07/2019 PCP: [name], PA-C   Brief Narrative:  [name] is a 63 y.o. male with medical history significant of CVA; RA; CAD; UIP/ILD; COPD; chronic combined CHD; and AAA presented with SOB.   Reportedly, he was told to follow [name] of PCCM and was last seen in June 2019.  After that he lost to follow-up.  Apparently, he spent a whole year in nursing home and was discharged on October 1.  He did see a rheumatologist on September 16 at controlled clinic in [place].  He is on Plaquenil.  In the past he has had Imuran and sulfasalazine and had failed those.  He was recently started on prednisone taper dose for 10 days due to pain.  Right after discharge from his nursing home, he started having sudden shortness of breath of 1 day duration for which he came to the emergency department.  He does not wear any home oxygen.  No sick contacts.  He was afebrile upon arrival to the ER but he was requiring 5 to 6 L oxygen to maintain his sats.  Chest x-ray was concerning for possible infiltrates so he was admitted to hospital service and was started on antibiotics.  Assessment & Plan:   Principal Problem:   Acute on chronic respiratory failure with hypoxia (HCC) Active Problems:   ILD (interstitial lung disease) (HCC)   HTN (hypertension)   Current smoker   Chronic combined systolic and diastolic CHF (congestive heart failure) (HCC)   Chronic pain syndrome   Rheumatoid arthritis (HCC)   Acute hypoxic respiratory failure likely due to progression of ILD UIP: Although chest x-ray showed possibility of pneumonia.  He was seen by PCCM.  Due to acute nature of shortness of breath, they suspected PE so he underwent CT angiogram and PE was ruled out.  He then had high-resolution chest CT which shows severe pulmonary fibrosis and bronchiectasis with peribronchovascular groundglass.There is a new nodule of the  right pulmonary apex measuring 9 mm.  Procalcitonin unremarkable.  Doubt pneumonia.  Seems to be progression of his lung disease.  PCCM on board.  Antibiotics discontinued.  Chronic combined congestive heart failure:  -6/19 Echo with EF 30-35% and grade 2 diastolic dysfunction -BNP is not significantly elevated.  Appears euvolemic.He does not seem to be on any diuretics.  Continue carvedilol.  Chronic pain -Continue Norco, Remeron, Wellbutrin  Rheumatoid arthritis -Continue Plaquenil  Essential hypertension -Controlled.  Continue Coreg  Tobacco dependence -Counseled for cessation.  HLD -Continue Lipitor  DVT prophylaxis: Lovenox Code Status: Full code Family Communication:  None present at bedside.  Plan of care discussed with patient in length and he verbalized understanding and agreed with it. Disposition Plan: TBD  Consultants:   PCCM  Procedures:   None  Antimicrobials:   All antibiotics discontinued today.   Subjective: Patient seen and examined.  He states that he feels better than yesterday.  Currently on 2 L of oxygen.  Objective: Vitals:   08/08/19 0209 08/08/19 0412 08/08/19 0717 08/08/19 0731  BP:  103/74  101/72  Pulse:  83  66  Resp:  (!) 26  18  Temp:  (!) 97.5 F (36.4 C)  97.6 F (36.4 C)  TempSrc:  Oral  Axillary  SpO2: 99% 99% 99% 99%  Weight:      Height:        Intake/Output Summary (Last 24 hours) at 08/08/2019 1057 Last  data filed at 08/08/2019 0422 Gross per 24 hour  Intake --  Output 450 ml  Net -450 ml   Filed Weights   08/07/19 0402  Weight: 70.3 kg    Examination:  General exam: Appears calm and comfortable  Respiratory system: Coarse breath sounds with rhonchi bilaterally. Respiratory effort normal. Cardiovascular system: S1 & S2 heard, RRR. No JVD, murmurs, rubs, gallops or clicks. No pedal edema. Gastrointestinal system: Abdomen is nondistended, soft and nontender. No organomegaly or masses felt. Normal bowel  sounds heard. Central nervous system: Alert and oriented. No focal neurological deficits. Extremities: Symmetric 5 x 5 power. Skin: No rashes, lesions or ulcers Psychiatry: Judgement and insight appear normal. Mood & affect appropriate.    Data Reviewed: I have personally reviewed following labs and imaging studies  CBC: Recent Labs  Lab 08/07/19 0405 08/08/19 0406  WBC 9.9 5.5  NEUTROABS 8.2* 4.3  HGB 13.3 10.7*  HCT 41.8 33.9*  MCV 82.3 82.3  PLT 240 195   Basic Metabolic Panel: Recent Labs  Lab 08/07/19 0405 08/08/19 0406  NA 136 138  K 3.9 4.0  CL 103 106  CO2 19* 24  GLUCOSE 140* 172*  BUN 10 20  CREATININE 0.93 0.94  CALCIUM 8.3* 8.3*   GFR: Estimated Creatinine Clearance: 80 mL/min (by C-G formula based on SCr of 0.94 mg/dL). Liver Function Tests: Recent Labs  Lab 08/07/19 0405  AST 19  ALT 15  ALKPHOS 79  BILITOT 1.7*  PROT 7.2  ALBUMIN 2.4*   No results for input(s): LIPASE, AMYLASE in the last 168 hours. No results for input(s): AMMONIA in the last 168 hours. Coagulation Profile: No results for input(s): INR, PROTIME in the last 168 hours. Cardiac Enzymes: No results for input(s): CKTOTAL, CKMB, CKMBINDEX, TROPONINI in the last 168 hours. BNP (last 3 results) No results for input(s): PROBNP in the last 8760 hours. HbA1C: No results for input(s): HGBA1C in the last 72 hours. CBG: No results for input(s): GLUCAP in the last 168 hours. Lipid Profile: No results for input(s): CHOL, HDL, LDLCALC, TRIG, CHOLHDL, LDLDIRECT in the last 72 hours. Thyroid Function Tests: No results for input(s): TSH, T4TOTAL, FREET4, T3FREE, THYROIDAB in the last 72 hours. Anemia Panel: No results for input(s): VITAMINB12, FOLATE, FERRITIN, TIBC, IRON, RETICCTPCT in the last 72 hours. Sepsis Labs: Recent Labs  Lab 08/07/19 0405 08/07/19 0623 08/08/19 0406 08/08/19 0832  PROCALCITON  --   --  <0.10  --   LATICACIDVEN 2.7* 2.0*  --  1.4    Recent Results  (from the past 240 hour(s))  SARS Coronavirus 2 [facility] order, Performed in [facility] hospital lab) Nasopharyngeal Nasopharyngeal Swab     Status: None   Collection Time: 08/07/19  4:15 AM   Specimen: Nasopharyngeal Swab  Result Value Ref Range Status   SARS Coronavirus 2 NEGATIVE NEGATIVE Final    Comment: (NOTE) If result is NEGATIVE SARS-CoV-2 target nucleic acids are NOT DETECTED. The SARS-CoV-2 RNA is generally detectable in upper and lower  respiratory specimens during the acute phase of infection. The lowest  concentration of SARS-CoV-2 viral copies this assay can detect is 250  copies / mL. A negative result does not preclude SARS-CoV-2 infection  and should not be used as the sole basis for treatment or other  patient management decisions.  A negative result may occur with  improper specimen collection / handling, submission of specimen other  than nasopharyngeal swab, presence of viral mutation(s) within the  areas targeted by  this assay, and inadequate number of viral copies  (<250 copies / mL). A negative result must be combined with clinical  observations, patient history, and epidemiological information. If result is POSITIVE SARS-CoV-2 target nucleic acids are DETECTED. The SARS-CoV-2 RNA is generally detectable in upper and lower  respiratory specimens dur ing the acute phase of infection.  Positive  results are indicative of active infection with SARS-CoV-2.  Clinical  correlation with patient history and other diagnostic information is  necessary to determine patient infection status.  Positive results do  not rule out bacterial infection or co-infection with other viruses. If result is PRESUMPTIVE POSTIVE SARS-CoV-2 nucleic acids MAY BE PRESENT.   A presumptive positive result was obtained on the submitted specimen  and confirmed on repeat testing.  While 2019 novel coronavirus  (SARS-CoV-2) nucleic acids may be present in the submitted sample  additional  confirmatory testing may be necessary for epidemiological  and / or clinical management purposes  to differentiate between  SARS-CoV-2 and other Sarbecovirus currently known to infect humans.  If clinically indicated additional testing with an alternate test  methodology [phone]([id number]) is advised. The SARS-CoV-2 RNA is generally  detectable in upper and lower respiratory sp ecimens during the acute  phase of infection. The expected result is Negative. Fact Sheet for Patients:  BoilerBrush.com.cyhttps://www.fda.gov/media/136312/download Fact Sheet for Healthcare Providers: https://pope.com/https://www.fda.gov/media/136313/download This test is not yet approved or cleared by the [place] FDA and has been authorized for detection and/or diagnosis of SARS-CoV-2 by FDA under an Emergency Use Authorization (EUA).  This EUA will remain in effect (meaning this test can be used) for the duration of the COVID-19 declaration under Section 564(b)(1) of the Act, 21 U.S.C. section 360bbb-3(b)(1), unless the authorization is terminated or revoked sooner. Performed at [facility], [address] [place], [place] [address]   Respiratory Panel by PCR     Status: None   Collection Time: 08/07/19  6:32 PM   Specimen: Nasopharyngeal Swab; Respiratory  Result Value Ref Range Status   Adenovirus NOT DETECTED NOT DETECTED Final   Coronavirus 229E NOT DETECTED NOT DETECTED Final    Comment: (NOTE) The Coronavirus on the Respiratory Panel, DOES NOT test for the novel  Coronavirus (2019 nCoV)    Coronavirus HKU1 NOT DETECTED NOT DETECTED Final   Coronavirus NL63 NOT DETECTED NOT DETECTED Final   Coronavirus OC43 NOT DETECTED NOT DETECTED Final   Metapneumovirus NOT DETECTED NOT DETECTED Final   Rhinovirus / Enterovirus NOT DETECTED NOT DETECTED Final   Influenza A NOT DETECTED NOT DETECTED Final   Influenza B NOT DETECTED NOT DETECTED Final   Parainfluenza Virus 1 NOT DETECTED NOT DETECTED Final   Parainfluenza Virus 2 NOT  DETECTED NOT DETECTED Final   Parainfluenza Virus 3 NOT DETECTED NOT DETECTED Final   Parainfluenza Virus 4 NOT DETECTED NOT DETECTED Final   Respiratory Syncytial Virus NOT DETECTED NOT DETECTED Final   Bordetella pertussis NOT DETECTED NOT DETECTED Final   Chlamydophila pneumoniae NOT DETECTED NOT DETECTED Final   Mycoplasma pneumoniae NOT DETECTED NOT DETECTED Final    Comment: Performed at [facility], [address] [place], [place] [address]      Radiology Studies: Ct Angio Chest Pe W Or Wo Contrast  Result Date: 08/07/2019 CLINICAL DATA:  Worsening acute on chronic respiratory failure. History of rheumatoid arthritis related interstitial lung disease. EXAM: CT ANGIOGRAPHY CHEST WITH CONTRAST TECHNIQUE: Multidetector CT imaging of the chest was performed using the standard protocol during bolus  administration of intravenous contrast. Multiplanar CT image reconstructions and MIPs were obtained to evaluate the vascular anatomy. CONTRAST:  OMNIPAQUE IOHEXOL 350 MG/ML SOLN COMPARISON:  High-resolution chest CT dated August 17, 2017. FINDINGS: Cardiovascular: Satisfactory opacification of the pulmonary arteries to the segmental level. No evidence of pulmonary embolism. Unchanged moderate cardiomegaly. No pericardial effusion. No thoracic aortic aneurysm. Coronary, aortic arch, and branch vessel atherosclerotic vascular disease. Prior CABG. Mediastinum/Nodes: Multiple prominent subcentimeter and borderline enlarged mediastinal and hilar lymph nodes are similar to prior study and likely reactive. No enlarged axillary lymph nodes. The thyroid gland, trachea, and esophagus demonstrate no significant findings. Lungs/Pleura: New small right pleural effusion. Patchy areas of ground-glass density, septal thickening, subpleural reticulation, mild bronchiectasis and bronchiolectasis, and mild honeycombing are again noted, with slightly more prominent interlobular septal thickening and hazy  ground-glass density when compared to prior study from October 2018. No consolidation or pneumothorax. Upper Abdomen: No acute abnormality. Punctate right nephrolithiasis. Musculoskeletal: No chest wall abnormality. No acute or significant osseous findings. Review of the MIP images confirms the above findings. IMPRESSION: 1.  No evidence of pulmonary embolism. 2. Chronic interstitial lung disease. Slightly more prominent interlobular septal thickening and hazy ground-glass density throughout both lungs when compared to the prior study may reflect a degree of pulmonary edema given new small right pleural effusion. 3.  Aortic atherosclerosis (ICD10-I70.0). Electronically Signed   By: Obie Dredge M.D.   On: 08/07/2019 22:25   Ct Chest High Resolution  Result Date: 08/08/2019 CLINICAL DATA:  Acute on chronic respiratory failure, interstitial lung disease, known rheumatoid associated ILD EXAM: CT CHEST WITHOUT CONTRAST TECHNIQUE: Multidetector CT imaging of the chest was performed following the standard protocol without intravenous contrast. High resolution imaging of the lungs was performed. Inspiratory and expiratory phase imaging was not submitted for review. COMPARISON:  08/17/2017 04/05/2017, 02/09/2017 FINDINGS: Cardiovascular: Aortic atherosclerosis. Aortic valve calcifications. Cardiomegaly with extensive 3 vessel coronary artery calcifications and/or stents status post median sternotomy and CABG. No pericardial effusion. Enlargement of the main pulmonary artery up to 3.9 cm. Mediastinum/Nodes: Unchanged enlarged mediastinal lymph nodes. Thyroid gland, trachea, and esophagus demonstrate no significant findings. Lungs/Pleura: There is moderate to severe pulmonary fibrosis in a pattern without clear gradient, featuring predominantly tubular bronchiectasis, extensive subpleural and peribronchovascular bronchiolectasis, and extensive peribronchovascular ground-glass. There are possible areas of honeycombing,  for example in the lateral segment right middle lobe. Findings, particularly bronchiolectasis, are slightly worsened over time in comparison to examination dated 08/17/2017, although there may be some component of acutely superimposed ground-glass which is difficult to account for. There is mild, diffuse interlobular septal thickening the extent that the septa remain anatomically intact. There is a new nodule of the right pulmonary apex measuring 9 mm (series 8, image 39). There are other areas of less well-defined new ground-glass, for example in the left apex (series 12, image 83). Small right, trace left pleural effusions. Upper Abdomen: No acute abnormality. Musculoskeletal: No chest wall mass or suspicious bone lesions identified. IMPRESSION: 1. There is moderate to severe pulmonary fibrosis in a pattern without clear gradient, featuring predominantly tubular bronchiectasis, extensive subpleural and peribronchovascular bronchiolectasis, and extensive peribronchovascular ground-glass. There are possible areas of honeycombing, for example in the lateral segment right middle lobe. Findings, particularly bronchiolectasis, are slightly worsened over time in comparison to examination dated 08/17/2017, although there may be some component of acutely superimposed ground-glass which is difficult to account for. Findings remain generally in keeping with known diagnosis of rheumatoid associated ILD. 2. Mild,  diffuse interlobular septal thickening and small right, trace left pleural effusions. There are scattered areas of new ground-glass, for example in the left pulmonary apex. Findings consistent with edema, atypical/viral infection, or acute inflammatory flare of interstitial lung disease. 3. There is a new nodule of the right pulmonary apex measuring 9 mm (series 8, image 39). Recommend CT follow-up in 3-6 months to assess for stability or resolution of this possibly infectious or inflammatory airspace opacity. 4.   Coronary artery disease.  Aortic Atherosclerosis (ICD10-I70.0). 5. Enlargement of the main pulmonary artery, as can be pulmonary hypertension. Electronically Signed   By: Lauralyn PrimesAlex  Bibbey M.D.   On: 08/08/2019 08:48   Dg Chest Port 1 View  Result Date: 08/07/2019 CLINICAL DATA:  Shortness of breath EXAM: PORTABLE CHEST 1 VIEW COMPARISON:  06/04/2018 FINDINGS: Generalized coarse interstitial opacity. Cardiomegaly. CABG. No acute finding when compared to prior. The PICC has been removed. IMPRESSION: 1. Pulmonary fibrosis with stable aeration compared to study last month. Pneumonia could easily be obscured given the extent of chronic lung disease. 2. Cardiomegaly. Electronically Signed   By: Marnee SpringJonathon  Watts M.D.   On: 08/07/2019 05:22    Scheduled Meds:  aspirin EC  81 mg Oral Daily   atorvastatin  80 mg Oral q1800   buPROPion  100 mg Oral BID   carvedilol  6.25 mg Oral BID WC   Chlorhexidine Gluconate Cloth  6 each Topical Daily   gabapentin  300 mg Oral Q8H   heparin  3,500 Units Intravenous Once   HYDROcodone-acetaminophen  1 tablet Oral TID   hydroxychloroquine  200 mg Oral BID   ipratropium-albuterol  3 mL Nebulization Q6H   methylPREDNISolone (SOLU-MEDROL) injection  60 mg Intravenous Q12H   mirtazapine  7.5 mg Oral QHS   multivitamin with minerals  1 tablet Oral Daily   nicotine  7 mg Transdermal Daily   nutrition supplement (JUVEN)  1 packet Oral BID BM   polyethylene glycol  17 g Oral Daily   saccharomyces boulardii  250 mg Oral BID   tamsulosin  0.4 mg Oral QPC supper   Continuous Infusions:  azithromycin Stopped (08/07/19 1439)   cefTRIAXone (ROCEPHIN)  IV 2 g (08/08/19 0958)     LOS: 1 day   Time spent: 34 minutes   Hughie Clossavi Tamatha Gadbois, MD [facility]  08/08/2019, 10:57 AM   To contact the attending provider between 7A-7P or the covering provider during after hours 7P-7A, please log into the web site [email or web address] and use password [email or web address].

## 2019-08-08 NOTE — Progress Notes (Addendum)
NAME:  [name], MRN:  [id number], DOB:  08-05-1956, LOS: 1 ADMISSION DATE:  08/07/2019, CONSULTATION DATE:  - 08/07/2019 REFERRING MD:  Dr Hal Hope - [facility], CHIEF COMPLAINT:  Worsening acute on hypoxemic resp failure   Brief History   Acute on chronic hypoxemic respiratory failure in a patient with rheumatoid arthritis related ILD.   Past Medical History   June 2019 - Office visit to Pulmonary ILD clinic Interstitial lung disease secondary to rheumatoid arthritis. At last visit contemplating starting CellCept. His PFTs showed: stability/improvement of his FVC but in talking to him and his wife it appeared that overall his functional status has declined. His ECOG was  4 after hospital admit  Results for Ryan, [name] (MRN [id number]) as of 04/29/2018 17:07  Ref. Range 02/25/2017 15:45 05/27/2017 15:27 12/17/2017 10:26 04/29/2018 15:47  FVC-Pre Latest Units: L 2.57 2.85 2.86 2.88  FVC-%Pred-Pre Latest Units: % 49 54 57 58  Results for Ryan, [name] (MRN [id number]) as of 04/29/2018 17:07  Ref. Range 02/25/2017 15:45 05/27/2017 15:27 12/17/2017 10:26 04/29/2018 15:47  DLCO unc Latest Units: ml/min/mmHg 11.65 13.66 11.16 12.18  DLCO unc % pred Latest Units: % 32 37 32 35   Results for Ryan, [name] (MRN [id number]) as of 04/29/2018 17:07  Ref. Range 04/12/2018 05:31  Creatinine Latest Ref Range: 0.61 - 1.24 mg/dL 0.80   Results for Ryan, [name] (MRN [id number]) as of 08/07/2019 17:26  Ref. Range 02/25/2017 07:33  ANA Ab, IFA Unknown Negative  ANCA Proteinase 3 Latest Ref Range: 0.0 - 3.5 U/mL <3.5  Anti JO-1 Latest Ref Range: 0.0 - 0.9 AI <0.2  CCP Antibodies IgG/IgA Latest Ref Range: 0 - 19 units 36 (H)  DNA-Histone Latest Ref Range: 0.0 - 0.9 Units 0.8  Myeloperoxidase Abs Latest Ref Range: 0.0 - 9.0 U/mL <9.0  RA Latex Turbid. Latest Ref Range: 0.0 - 13.9 IU/mL >650.0 (H)  Cytoplasmic (C-ANCA) Latest Ref Range: Neg:<1:20 titer <1:20  P-ANCA Latest Ref Range: Neg:<1:20  titer <1:20  Atypical P-ANCA titer Latest Ref Range: Neg:<1:20 titer <1:20  Scleroderma (Scl-70) (ENA) Antibody, IgG Latest Ref Range: 0.0 - 0.9 AI <0.2    has a past medical history of AAA (abdominal aortic aneurysm) (HCC), AKI (acute kidney injury) ([place]), Arthritis, CAD in native artery, Carotid artery disease ([place]), Chronic combined systolic and diastolic CHF (congestive heart failure) ([place]), COPD (chronic obstructive pulmonary disease) ([place]), GERD (gastroesophageal reflux disease), Hyperkalemia, ILD (interstitial lung disease) ([place]), Lung nodule, MI (myocardial infarction) ([place]), Normocytic anemia, NSVT (nonsustained ventricular tachycardia) ([place]), Pancreatitis, Pneumonia (11/2017), RA (rheumatoid arthritis) ([place]), Stroke ([place]), and Tobacco abuse.   reports that he has been smoking cigarettes. He has a 47.00 pack-year smoking history. He has never used smokeless tobacco.  Diagnostics  CT chest 10/5: 1. There is moderate to severe pulmonary fibrosis in a pattern without clear gradient, featuring predominantly tubular bronchiectasis, extensive subpleural and peribronchovascular bronchiolectasis, and extensive peribronchovascular ground-glass. There are possible areas of honeycombing, for example in the lateral segment right middle lobe. Findings, particularly bronchiolectasis, are slightly worsened over time in comparison to examination dated 08/17/2017, although there may be some component of acutelysuperimposed ground-glass which is difficult to account for. Findings remain generally in keeping with known diagnosis of rheumatoid associated ILD. 2. Mild, diffuse interlobular septal thickening and small right, trace left pleural effusions. There are scattered areas of new ground-glass, for example in the left pulmonary apex. Findings consistent with edema, atypical/viral infection, or acute inflammatory flare of interstitial  lung disease. 3. There is a new nodule of the right pulmonary apex  measuring 9 mm (series 8, image 39).  Infectious disease eval  COVID-19 10/5: Negative Respiratory viral panel 10/5: Negative Urinary strep antigen 10/5 Urinary Legionella antigen 10/5   Antibiotics/antiinfectives   Rocephin 10/5 Azithromycin 10/5  SUBJECTIVE/OVERNIGHT/INTERVAL HX  Is about the same   Objective   Blood pressure 101/72, pulse 66, temperature 97.6 F (36.4 C), temperature source Axillary, resp. rate 18, height 6\' 1"  (1.854 m), weight 70.3 kg, SpO2 99 %.        Intake/Output Summary (Last 24 hours) at 08/08/2019 0916 Last data filed at 08/08/2019 0422 Gross per 24 hour  Intake no documentation  Output 450 ml  Net -450 ml   Filed Weights   08/07/19 0402  Weight: 70.3 kg    Examination: General this is a pleasant 63 year old white male who appears much older than stated age she is resting in bed and not currently in any acute distress HEENT normocephalic atraumatic no jugular venous distention is appreciated mucous membranes are moist sclera are nonicteric Pulmonary: Posterior rales present, no accessory use on exam Cardiac: Regular rate and rhythm Abdomen: Soft nontender Extremities: Warm dry brisk capillary refill no significant edema Neuro: Awake oriented no focal deficits.  Resolved Hospital Problem list   X  Assessment & Plan:   Acute hypoxemic respiratory failure superimposed on chronic ILD/UIP secondary to rheumatoid arthritis CHF (combined systolic:EF 30-35% and diastolic HF) Chronic Pain HTN Tobacco abuse    Acute hypoxemic respiratory failure superimposed on chronic ILD/UIP secondary to rheumatoid arthritis COVID-19 ruled out  Differential diagnosis includes: acute on chronic heart failure although he looks euvolemic; Other respiratory virus exacerbations also in the differential diagnosis Interstitial lung disease flare although this would be highly unusual given the rapid decline -Respiratory viral panel is negative -CT chest  personally reviewed shows significant worsening of diffuse interstitial changes as well as BTX. NO PE.  -PCT neg -urine antigens neg   Plan F/u urine antigens Day # 2 azith and rocephin No change in Hydroxychloroquine Change solucortef to solumedrol  Am cxr  Assure negative fluid balance    63 ACNP-BC [facility] Pulmonary/Critical Care Pager # [phone] OR # [phone] if no answer

## 2019-08-08 NOTE — Consult Note (Addendum)
[facility] Nurse wound consult note Patient receiving care in [place].  I spoke with his primary RN, Asencion Partridge, via telephone.  She was the nurse that admitted him yesterday. The SecureChat request for photo that I sent to Dr. Loann Quill earlier this morning has been "seen". The response from Dr. Doristine Bosworth for the request for the photo was received at 10:05 and stated :  "This patient is covid positive and for patients like this, nurses on 2W have been taking pictures. I will ask you to touch base with pt's primry nurse. Thank you."   Reason for Consult: buttock wound Wound type: unclear at this time.  Asencion Partridge described it as follows:  Resembling an incision, about 1.5 inches long, wound bed is reddened, it was draining yellow, and had a 2 x 2 gauze over it that was saturated.  Dressing procedure/placement/frequency:  Cleanse wound on buttock with soap and water. Pat dry. Place a small piece of AquaCel Ag (silver hydrofiber) Kellie Simmering [phone]) over the wound, cover that with a small foam dressing. Change daily. Monitor the wound area(s) for worsening of condition such as: Signs/symptoms of infection,  Increase in size,  Development of or worsening of odor, Development of pain, or increased pain at the affected locations.  Notify the medical team if any of these develop.  Thank you for the consult.  [facility] nurse will not follow at this time.  Please re-consult the [facility] team if needed.  Val Riles, RN, MSN, CWOCN, CNS-BC, pager [phone]

## 2019-08-08 NOTE — Telephone Encounter (Signed)
I can not give any more verbal orders. Originally gave for therapy and a follow up appointment which is scheduled August 16, 2019 at that time I can give verbal or written therapy orders

## 2019-08-08 NOTE — Progress Notes (Signed)
  Echocardiogram 2D Echocardiogram has been performed.  [name] 08/08/2019, 2:47 PM

## 2019-08-09 ENCOUNTER — Inpatient Hospital Stay (HOSPITAL_COMMUNITY): Payer: Medicaid Other

## 2019-08-09 DIAGNOSIS — I5043 Acute on chronic combined systolic (congestive) and diastolic (congestive) heart failure: Secondary | ICD-10-CM

## 2019-08-09 LAB — CBC
HCT: 32.5 % — ABNORMAL LOW (ref 39.0–52.0)
Hemoglobin: 9.7 g/dL — ABNORMAL LOW (ref 13.0–17.0)
MCH: 25.7 pg — ABNORMAL LOW (ref 26.0–34.0)
MCHC: 29.8 g/dL — ABNORMAL LOW (ref 30.0–36.0)
MCV: 86.2 fL (ref 80.0–100.0)
Platelets: 174 10*3/uL (ref 150–400)
RBC: 3.77 MIL/uL — ABNORMAL LOW (ref 4.22–5.81)
RDW: 17.3 % — ABNORMAL HIGH (ref 11.5–15.5)
WBC: 3.4 10*3/uL — ABNORMAL LOW (ref 4.0–10.5)
nRBC: 0 % (ref 0.0–0.2)

## 2019-08-09 LAB — PROCALCITONIN: Procalcitonin: <0.1 ng/mL

## 2019-08-09 MED ORDER — PREDNISONE 20 MG PO TABS
50.0000 mg | ORAL_TABLET | Freq: Every day | ORAL | Status: DC
  Administered 2019-08-11 – 2019-08-13 (×3): 50 mg via ORAL
  Filled 2019-08-09 (×4): qty 2

## 2019-08-09 MED ORDER — IPRATROPIUM-ALBUTEROL 0.5-2.5 (3) MG/3ML IN SOLN
3.0000 mL | Freq: Two times a day (BID) | RESPIRATORY_TRACT | Status: DC
  Administered 2019-08-09 – 2019-08-13 (×6): 3 mL via RESPIRATORY_TRACT
  Filled 2019-08-09 (×6): qty 3

## 2019-08-09 MED ORDER — FUROSEMIDE 40 MG PO TABS
40.0000 mg | ORAL_TABLET | Freq: Every day | ORAL | Status: DC
  Administered 2019-08-09: 40 mg via ORAL
  Filled 2019-08-09: qty 1

## 2019-08-09 MED ORDER — FUROSEMIDE 10 MG/ML IJ SOLN
40.0000 mg | Freq: Two times a day (BID) | INTRAMUSCULAR | Status: DC
  Administered 2019-08-09 – 2019-08-10 (×2): 40 mg via INTRAVENOUS
  Filled 2019-08-09 (×2): qty 4

## 2019-08-09 MED ORDER — CARVEDILOL 3.125 MG PO TABS
3.1250 mg | ORAL_TABLET | Freq: Two times a day (BID) | ORAL | Status: DC
  Administered 2019-08-09 – 2019-08-12 (×6): 3.125 mg via ORAL
  Filled 2019-08-09 (×7): qty 1

## 2019-08-09 MED ORDER — FUROSEMIDE 10 MG/ML IJ SOLN: 40.0000 mg | Freq: Two times a day (BID) | INTRAMUSCULAR | Status: DC

## 2019-08-09 MED ORDER — IPRATROPIUM-ALBUTEROL 0.5-2.5 (3) MG/3ML IN SOLN
3.0000 mL | Freq: Three times a day (TID) | RESPIRATORY_TRACT | Status: DC
  Administered 2019-08-09: 3 mL via RESPIRATORY_TRACT
  Filled 2019-08-09: qty 3

## 2019-08-09 MED ORDER — SODIUM CHLORIDE 0.9% FLUSH
3.0000 mL | Freq: Two times a day (BID) | INTRAVENOUS | Status: DC
  Administered 2019-08-09 (×2): 3 mL via INTRAVENOUS

## 2019-08-09 MED ORDER — POTASSIUM CHLORIDE CRYS ER 20 MEQ PO TBCR
20.0000 meq | EXTENDED_RELEASE_TABLET | Freq: Once | ORAL | Status: AC
  Administered 2019-08-09: 20 meq via ORAL
  Filled 2019-08-09: qty 1

## 2019-08-09 NOTE — Evaluation (Signed)
Physical Therapy Evaluation Patient Details Name: [name] MRN: [id number] DOB: 02-09-1956 Today's Date: 08/09/2019   History of Present Illness  63 yo male with onset of hypoxia and acute respiratory failure was admitted, now cleared for PE and noted he may have pulm HTN, has pulm nodule.  PMHx:   CVA, CABG x 4 vessels, CAD, atherosclerosis, sacral decubitus, osteomyelitis, prev smoker, ILD, cardiomegaly, AAA, resp failure, CHF, RA, COPD  Clinical Impression  Pt was seen for progression of mobility and note that he is demonstrating a better control of gait than the O2 sats indicate at 85% on tank air.  Pt will be seen acutely to work on endurance and strength, but will anticipate his need for rehab to increase the control of balance and to avoid medical complications from fall risk and drops in O2 sats.  He is not managing his tank, and will work acutely toward his independence with all movement to reduce time needed in SNF location.      Follow Up Recommendations SNF;Supervision for mobility/OOB    Equipment Recommendations  None recommended by PT    Recommendations for Other Services       Precautions / Restrictions Precautions Precautions: Fall Precaution Comments: monitor O2 sats Restrictions Weight Bearing Restrictions: No      Mobility  Bed Mobility Overal bed mobility: Modified Independent             General bed mobility comments: extra time with bedrail used  Transfers Overall transfer level: Needs assistance Equipment used: Rolling walker (2 wheeled);1 person hand held assist Transfers: Sit to/from Stand Sit to Stand: Min assist         General transfer comment: min assist to stand and steady at [employer]  Ambulation/Gait Ambulation/Gait assistance: Min guard;+2 physical assistance;+2 safety/equipment Gait Distance (Feet): 40 Feet Assistive device: Rolling walker (2 wheeled);1 person hand held assist Gait Pattern/deviations: Step-through  pattern;Step-to pattern;Decreased stride length;Wide base of support Gait velocity: reduced Gait velocity interpretation: <1.31 ft/sec, indicative of household ambulator General Gait Details: at half way point on tank air was down to 83% and then recovered to 90% eventually, 99% at rest  Stairs            Wheelchair Mobility    Modified Rankin (Stroke Patients Only)       Balance Overall balance assessment: Needs assistance Sitting-balance support: Feet supported Sitting balance-Leahy Scale: Good     Standing balance support: Bilateral upper extremity supported;During functional activity Standing balance-Leahy Scale: Fair                               Pertinent Vitals/Pain Pain Assessment: No/denies pain    Home Living Family/patient expects to be discharged to:: Private residence Living Arrangements: Alone Available Help at Discharge: Friend(s);Family;Available PRN/intermittently Type of Home: House Home Access: Stairs to enter Entrance Stairs-Rails: Can reach both(uses just the L with SPC per pt) Entrance Stairs-Number of Steps: 6 Home Layout: One level Home Equipment: Walker - 2 wheels;Walker - 4 wheels;Cane - single point      Prior Function Level of Independence: Independent with assistive device(s)         Comments: was not on O2 prior to the admission     Hand Dominance   Dominant Hand: Right    Extremity/Trunk Assessment   Upper Extremity Assessment Upper Extremity Assessment: Overall WFL for tasks assessed    Lower Extremity Assessment Lower Extremity Assessment: Generalized weakness  Cervical / Trunk Assessment Cervical / Trunk Assessment: Normal  Communication   Communication: No difficulties  Cognition Arousal/Alertness: Awake/alert Behavior During Therapy: WFL for tasks assessed/performed Overall Cognitive Status: Within Functional Limits for tasks assessed                                         General Comments General comments (skin integrity, edema, etc.): pt is up to walk with significant drop in O2 sats that impacts safety and ability to go home without 24/7 care.  Has a plan in place proposed to pt to go to rehab for a short stay and then home.  Will continue to monitor response to gait and O2    Exercises     Assessment/Plan    PT Assessment Patient needs continued PT services  PT Problem List Decreased strength;Decreased range of motion;Decreased activity tolerance;Decreased balance;Decreased mobility;Decreased coordination;Decreased safety awareness;Decreased knowledge of use of DME;Cardiopulmonary status limiting activity       PT Treatment Interventions DME instruction;Gait training;Stair training;Functional mobility training;Therapeutic activities;Therapeutic exercise;Balance training;Neuromuscular re-education;Patient/family education    PT Goals (Current goals can be found in the Care Plan section)  Acute Rehab PT Goals Patient Stated Goal: to get stronger and go home PT Goal Formulation: With patient Time For Goal Achievement: 08/23/19 Potential to Achieve Goals: Good    Frequency Min 3X/week   Barriers to discharge Inaccessible home environment;Decreased caregiver support home with family with 6 stairs to enter    Co-evaluation               AM-PAC PT "6 Clicks" Mobility  Outcome Measure Help needed turning from your back to your side while in a flat bed without using bedrails?: None Help needed moving from lying on your back to sitting on the side of a flat bed without using bedrails?: A Little Help needed moving to and from a bed to a chair (including a wheelchair)?: A Little Help needed standing up from a chair using your arms (e.g., wheelchair or bedside chair)?: A Little Help needed to walk in hospital room?: A Little Help needed climbing 3-5 steps with a railing? : A Lot 6 Click Score: 18    End of Session Equipment Utilized During  Treatment: Gait belt;Oxygen Activity Tolerance: Patient tolerated treatment well;Patient limited by fatigue;Treatment limited secondary to medical complications (Comment) Patient left: in bed;with call bell/phone within reach;with bed alarm set;with nursing/sitter in room Nurse Communication: Mobility status PT Visit Diagnosis: Unsteadiness on feet (R26.81);Muscle weakness (generalized) (M62.81);Difficulty in walking, not elsewhere classified (R26.2)    Time: 9357-0177 PT Time Calculation (min) (ACUTE ONLY): 26 min   Charges:   PT Evaluation $PT Eval Moderate Complexity: 1 Mod PT Treatments $Gait Training: 8-22 mins       Ivar Drape 08/09/2019, 3:50 PM   Samul Dada, PT MS Acute Rehab Dept. Number: [facility] [id number] and [facility] [phone]

## 2019-08-09 NOTE — H&P (View-Only) (Signed)
Advanced Heart Failure Team Consult Note   Primary Physician: Ryan Specter, PA-C PCP-Cardiologist:  Ryan Alexander, MD  Reason for Consultation: CHF  HPI:    [name] is seen today for evaluation of CHF at the request of Dr. Renford [name].   63 yo with history of RA-related ILD, CAD s/p CABG, CVA, ischemic cardiomyopathy, prior osteomyelitis related to sacral decubitus ulcer was admitted with acute hypoxemic respiratory failure.  Patient has had a long history of ILD, followed by Dr. Marchelle [name].  He had not been on oxygen at home.  He has rheumatoid arthritis, only on Plaquenil at time of admission.  S/p CABG in 1993, last cath in 2018 showed occluded SVG-RCA, seq SVG-OM and ramus, occluded native left main/LAD/LCx/ramus with patent LIMA-LAD (70% distal LAD) and patent RCA with 70% proximal stenosis. Echo in 6/17 showed EF 30-35%, echo in 10/20 showed EF 25-30%.  He has not been seen by a cardiologist in a couple of years.    A few days prior to admission, patient developed chest tightness centrally that waxed and waned but did not fully resolve.  He also developed dyspnea to the point where he was out of breath with any exertion.  He came to the ER and was noted to be hypoxemic, required 6L [place] (has now been titrated down to 2L [place]).  He was treated empirically with ceftriaxone/azithromycin and was started on a steroid taper for possible ILD flare.  CTA chest showed no PE.  High resolution CT chest showed progression of ILD compared to 2018, also with superimposed pulmonary edema versus acute inflammation.  He has been afebrile.  Systolic BP has been in the 80s-90s at times, up to 100s today.  Lactate was mildly elevated at 2, procalcitonin was not elevated.  Troponin 29 => 36. Antibiotics have been stopped as PNA is unlikely.   Review of Systems: All systems reviewed and negative except as per HPI.   Home Medications Prior to Admission medications   Medication Sig Start Date End Date  Taking? Authorizing Provider  acetaminophen (TYLENOL) 500 MG tablet Take 1,000 mg by mouth every 6 (six) hours as needed for mild pain.    Yes [provider]  albuterol (PROVENTIL) (2.5 MG/3ML) 0.083% nebulizer solution Take 2.5 mg by nebulization every 6 (six) hours as needed for wheezing or shortness of breath.   Yes [provider]  aspirin EC 81 MG tablet Take 81 mg by mouth daily.  12/03/16  Yes [provider]  atorvastatin (LIPITOR) 40 MG tablet Take 1 tablet (40 mg total) by mouth daily at 6 PM. Patient taking differently: Take 80 mg by mouth daily at 6 PM.  03/15/17  Yes [name], Ryan N, PA-C  buPROPion (WELLBUTRIN SR) 100 MG 12 hr tablet Take 100 mg by mouth 2 (two) times daily.   Yes [provider]  carvedilol (COREG) 6.25 MG tablet Take 1 tablet (6.25 mg total) by mouth 2 (two) times daily with a meal. 05/18/18 08/07/19 Yes Ryan Bong, MD  cholecalciferol (VITAMIN D3) 25 MCG (1000 UT) tablet Take 1,000 Units by mouth daily.   Yes [provider]  gabapentin (NEURONTIN) 300 MG capsule Take 1 capsule (300 mg total) by mouth every 8 (eight) hours. 01/18/18  Yes Ryan Jolly, MD  guaiFENesin (MUCINEX) 600 MG 12 hr tablet Take 600 mg by mouth 2 (two) times daily as needed for cough or to loosen phlegm.   Yes [provider]  HYDROcodone-acetaminophen [facility]) [phone]  MG tablet Take 1 tablet by mouth 3 (three) times daily. 06/07/18  Yes Ryan Hale, MD  hydroxychloroquine (PLAQUENIL) 200 MG tablet Take 1 tablet (200 mg total) by mouth 2 (two) times daily. 06/22/18  Yes Ryan Bong, MD  magnesium hydroxide (MILK OF MAGNESIA) 400 MG/5ML suspension Take 30 mLs by mouth daily as needed for mild constipation. And at bedtime   Yes [provider]  Menthol-Methyl Salicylate (MUSCLE RUB EX) Apply 1 application topically as needed (for shoulder or knee pain).    Yes [provider]  mirtazapine (REMERON) 7.5 MG tablet Take  7.5 mg by mouth at bedtime.   Yes [provider]  nicotine (NICODERM CQ - DOSED IN MG/24 HR) 7 mg/24hr patch Place 1 patch (7 mg total) onto the skin daily. 04/13/18  Yes Ryan Daniel., MD  nutrition supplement, JUVEN, (JUVEN) PACK Take 1 packet by mouth 2 (two) times daily between meals. 05/18/18  Yes Ryan Bong, MD  nystatin cream (MYCOSTATIN) Apply topically 2 (two) times daily. Patient taking differently: Apply 1 application topically 2 (two) times daily.  05/18/18  Yes Ryan Bong, MD  polyethylene glycol [facility] Ethelene Hal) packet Take 17 g by mouth 2 (two) times daily. Until having 1 soft BM daily, then can decrease to daily or as needed Patient taking differently: Take 17 g by mouth daily. Until having 1 soft BM daily, then can decrease to daily or as needed 04/12/18  Yes Ryan Daniel., MD  potassium chloride SA (K-DUR,KLOR-CON) 20 MEQ tablet Take 2 tablets (40 mEq total) by mouth daily. Patient taking differently: Take 20 mEq by mouth 2 (two) times daily.  05/19/18  Yes Ryan Bong, MD  saccharomyces boulardii (FLORASTOR) 250 MG capsule Take 1 capsule (250 mg total) by mouth 2 (two) times daily. 05/18/18  Yes Ryan Bong, MD  tamsulosin (FLOMAX) 0.4 MG CAPS capsule Take 1 capsule (0.4 mg total) by mouth daily after supper. 06/07/18  Yes [name], Courage, MD  PROVENTIL HFA 108 (90 Base) MCG/ACT inhaler INHALE 2 PUFFS EVERY 6 HOURS AS NEEDED FOR WHEEZE/SHORTNESS OF BREATH Patient taking differently: Inhale 2 puffs into the lungs every 6 (six) hours as needed for wheezing or shortness of breath.  03/29/18   Kalman Shan, MD    Past Medical History: 1. Rheumatoid arthritis: Followed at [facility]  On Plaquenil. 2. Interstitial lung disease: Though to be RA-related, followed by Dr. Marchelle [name].  He has not been using oxygen at home.  3. H/o CVA 4. AAA: [place] in 10/19 with 4.6 cm AAA.  5. 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  6. Carotid stenosis: Right CEA in 1/18.  7. CAD: s/p CABG x 4 in 1993.  - LHC in 4/18 with occluded LM/LCx/LAD/ramus, occluded seq SVG-OM/ramus, occluded SVG-RCA.  70% proximal RCA, patent LIMA-LAD, 70% distal LAD.  8. Chronic systolic CHF: Ischemic cardiomyopathy.  - Echo (6/17): EF 30-35%, moderate LVH.  - Echo (10/20): EF 25-30%  9. COPD: History of smoking.   Past Surgical History: Past Surgical History:  Procedure Laterality Date   CARDIAC CATHETERIZATION     CAROTID ENDARTERECTOMY Left    Heart Bypass  10/1992   INCISION AND DRAINAGE PERIRECTAL ABSCESS Right 05/09/2018   Procedure: IRRIGATION AND DEBRIDEMENT  RIGHT BUTTOCK ABSCESS;  Surgeon: Darnell Level, MD;  Location: [facility];  Service: General;  Laterality: Right;   LEFT HEART CATH AND CORS/GRAFTS ANGIOGRAPHY N/A 03/01/2017   Procedure: Left Heart  Cath and Cors/Grafts Angiography;  Surgeon: Belva Crome, MD;  Location: [place] [facility];  Service: Cardiovascular;  Laterality: N/A;   Throat Biopsy     Cat scratch fever    Family History: Family History  Problem Relation Age of Onset   Heart murmur Mother    Heart disease Mother    Heart attack Mother    Suicidality Father    Heart attack Father    Stroke Brother    Heart attack Brother    Heart attack Maternal Grandmother    Cancer Maternal Grandmother        unknown type   Lung cancer Maternal Aunt    Lung cancer Maternal Uncle    Cancer Maternal Aunt        unknown cancer   Heart attack Brother     Social History: Social History   Socioeconomic History   Marital status: Single    Spouse name: Not on file   Number of children: 1   Years of education: Not on file   Highest education level: Not on file  Occupational History   Occupation: unemployeed    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  Social Designer, fashion/clothing strain: Not on file   Food insecurity    Worry: Not on file    Inability: Not on file    Transportation needs    Medical: Not on file    Non-medical: Not on file  Tobacco Use   Smoking status: Current Every Day Smoker    Packs/day: 1.00    Years: 47.00    Pack years: 47.00    Types: Cigarettes   Smokeless tobacco: Never Used   Tobacco comment: 0.5 pack daily or less now  Substance and Sexual Activity   Alcohol use: No    Comment: remote history of beer drinking   Drug use: No   Sexual activity: Not on file  Lifestyle   Physical activity    Days per week: Not on file    Minutes per session: Not on file   Stress: Not on file  Relationships   Social connections    Talks on phone: Not on file    Gets together: Not on file    Attends religious service: Not on file    Active member of club or organization: Not on file    Attends meetings of clubs or organizations: Not on file    Relationship status: Not on file  Other Topics Concern   Not on file  Social History Narrative   Not on file    Allergies:  No Known Allergies  Objective:    Vital Signs:   Temp:  [97.6 F (36.4 C)-98.4 F (36.9 C)] 97.6 F (36.4 C) (10/07 0755) Pulse Rate:  [64-71] 64 (10/07 0755) Resp:  [20-24] 24 (10/07 0755) BP: (96-102)/(60-66) 102/66 (10/07 0755) SpO2:  [98 %-100 %] 99 % (10/07 0755) Last BM Date: 08/08/19(per pt)  Weight change: Filed Weights   08/07/19 0402  Weight: 70.3 kg    Intake/Output:   Intake/Output Summary (Last 24 hours) at 08/09/2019 1115 Last data filed at 08/09/2019 0400 Gross per 24 hour  Intake 3346.17 ml  Output 500 ml  Net 2846.17 ml      Physical Exam    General:  Well appearing. No resp difficulty HEENT: normal Neck: supple. JVP 10-12 cm. Carotids 2+ bilat; no bruits. No lymphadenopathy or thyromegaly appreciated. Cor: PMI nondisplaced. Regular rate & rhythm. No rubs, gallops. 2/6  HSM LLSB/apex.  Lungs: Dry crackles at bases.  Abdomen: soft, nontender, nondistended. No hepatosplenomegaly. No bruits or masses. Good bowel  sounds. Extremities: no cyanosis, clubbing, rash, edema Neuro: alert & orientedx3, cranial nerves grossly intact. moves all 4 extremities w/o difficulty. Affect pleasant   Telemetry   NSR in 60s (personally reviewed).   EKG    Sinus tachy with IVCD, rate 126 msec. Personally reviewed  Labs   Basic Metabolic Panel: Recent Labs  Lab 08/07/19 0405 08/08/19 0406  NA 136 138  K 3.9 4.0  CL 103 106  CO2 19* 24  GLUCOSE 140* 172*  BUN 10 20  CREATININE 0.93 0.94  CALCIUM 8.3* 8.3*    Liver Function Tests: Recent Labs  Lab 08/07/19 0405  AST 19  ALT 15  ALKPHOS 79  BILITOT 1.7*  PROT 7.2  ALBUMIN 2.4*   No results for input(s): LIPASE, AMYLASE in the last 168 hours. No results for input(s): AMMONIA in the last 168 hours.  CBC: Recent Labs  Lab 08/07/19 0405 08/08/19 0406 08/09/19 0407  WBC 9.9 5.5 3.4*  NEUTROABS 8.2* 4.3  --   HGB 13.3 10.7* 9.7*  HCT 41.8 33.9* 32.5*  MCV 82.3 82.3 86.2  PLT 240 195 174    Cardiac Enzymes: No results for input(s): CKTOTAL, CKMB, CKMBINDEX, TROPONINI in the last 168 hours.  BNP: BNP (last 3 results) Recent Labs    08/07/19 0405  BNP 304.3*    ProBNP (last 3 results) No results for input(s): PROBNP in the last 8760 hours.   CBG: No results for input(s): GLUCAP in the last 168 hours.  Coagulation Studies: No results for input(s): LABPROT, INR in the last 72 hours.   Imaging   Dg Chest Port 1 View  Result Date: 08/09/2019 CLINICAL DATA:  Hypoxia EXAM: PORTABLE CHEST 1 VIEW COMPARISON:  08/07/2019 FINDINGS: Postsurgical changes to the sternum and mediastinum. Stable cardiomegaly. Diffuse coarsened interstitial markings compatible with known chronic interstitial lung disease. No superimposed new airspace consolidation. No large pleural fluid collection. No pneumothorax. IMPRESSION: Extensive chronic interstitial lung changes without a superimposed airspace consolidation. Electronically Signed   By: Duanne GuessNicholas   Plundo M.D.   On: 08/09/2019 10:48      Medications:     Current Medications:  aspirin EC  81 mg Oral Daily   atorvastatin  80 mg Oral q1800   buPROPion  100 mg Oral BID   carvedilol  3.125 mg Oral BID WC   Chlorhexidine Gluconate Cloth  6 each Topical Daily   enoxaparin (LOVENOX) injection  40 mg Subcutaneous Q24H   furosemide  40 mg Intravenous BID   gabapentin  300 mg Oral Q8H   HYDROcodone-acetaminophen  1 tablet Oral TID   hydroxychloroquine  200 mg Oral BID   ipratropium-albuterol  3 mL Nebulization BID   mirtazapine  7.5 mg Oral QHS   multivitamin with minerals  1 tablet Oral Daily   nicotine  7 mg Transdermal Daily   nutrition supplement (JUVEN)  1 packet Oral BID BM   polyethylene glycol  17 g Oral Daily   potassium chloride  20 mEq Oral Once   [START ON 08/10/2019] predniSONE  50 mg Oral Q breakfast   saccharomyces boulardii  250 mg Oral BID   sodium chloride flush  3 mL Intravenous Q12H   tamsulosin  0.4 mg Oral QPC supper     Infusions:    Assessment/Plan   1. Acute hypoxemic respiratory failure: Patient was admitted with dyspnea, hypoxemia, and  chest pain.  I do not think that he has an infectious PNA, no fever and normal PCT.  He is now off antibiotics.  No PE.  High resolution CT chest showed evidence for some worsening of ILD compared to 2018 CT, also with superimposed pulmonary edema versus acute inflammation.  - Agree with steroid burst for possible ILD flare.  - I am concerned that he is volume overloaded as well, with CHF exacerbation playing a role in his presentation. I will start Lasix 40 mg IV bid for now and follow response.  2. Acute on chronic systolic CHF: Ischemic cardiomyopathy. Echo this admission with EF 25-30%, somewhat lower than prior.  On exam, I think he does appear to volume overload (JVD) though exam is somewhat difficult.   - I will treat with Lasix 40 mg IV bid as above and follow response.   - With soft BP,  decrease Coreg to 3.125 mg bid.  - Ideally, would start spironolactone and ACEI/ARB/ARNI, but will need to follow BP trend.  - To help determine the contribution to current presentation from CHF (versus ILD), I will arrange for RHC tomorrow. This will also allow evaluation of cardiac output in light of mildly elevated lactate.   Discussed risks/benefits with patient and his sister, they agree to study.  - Strict I/Os and daily weights.  3. CAD: s/p CABG x 3.  Last cath as above in 2018, only the RCA and LIMA-LAD remain patent. He had significant chest tightness prior to admission though he did not have a significant troponin elevation.   - Given question of contribution of heart disease to current presentation, will do coronary angiography with RHC tomorrow (discussed this as well with patient and sister).   - Continue ASA 81 and atorvastatin 80 daily.  4. ILD: High resolution CT with some progression of ILD as above.  - Treated with steroid taper.  5. RA: On Plaquenil. 6. H/o CVA 7. H/o AAA: 4.6 cm in 10/19, will need repeat evaluation soon.  8. Carotid stenosis: s/p right CEA.  9. Prominent murmur in mitral area, but only mild-moderate MR reported on echo.  Will need to review.   Length of Stay: 2  Marca Anconaalton Pamelyn Bancroft, MD  08/09/2019, 11:15 AM  Advanced Heart Failure Team Pager [phone] (M-F; 7a - 4p)  Please contact [facility] for night-coverage after hours (4p -7a ) and weekends on [email or web address]

## 2019-08-09 NOTE — Consult Note (Addendum)
Advanced Heart Failure Team Consult Note   Primary Physician: [name], PA-C PCP-Cardiologist:  [name], MD  Reason for Consultation: CHF  HPI:    [name] is seen today for evaluation of CHF at the request of [name].   63 yo with history of RA-related ILD, CAD s/p CABG, CVA, ischemic cardiomyopathy, prior osteomyelitis related to sacral decubitus ulcer was admitted with acute hypoxemic respiratory failure.  Patient has had a long history of ILD, followed by Dr. [name].  He had not been on oxygen at home.  He has rheumatoid arthritis, only on Plaquenil at time of admission.  S/p CABG in 1993, last cath in 2018 showed occluded SVG-RCA, seq SVG-OM and ramus, occluded native left main/LAD/LCx/ramus with patent LIMA-LAD (70% distal LAD) and patent RCA with 70% proximal stenosis. Echo in 6/17 showed EF 30-35%, echo in 10/20 showed EF 25-30%.  He has not been seen by a cardiologist in a couple of years.    A few days prior to admission, patient developed chest tightness centrally that waxed and waned but did not fully resolve.  He also developed dyspnea to the point where he was out of breath with any exertion.  He came to the ER and was noted to be hypoxemic, required 6L [place] (has now been titrated down to 2L [place]).  He was treated empirically with ceftriaxone/azithromycin and was started on a steroid taper for possible ILD flare.  CTA chest showed no PE.  High resolution CT chest showed progression of ILD compared to 2018, also with superimposed pulmonary edema versus acute inflammation.  He has been afebrile.  Systolic BP has been in the 80s-90s at times, up to 100s today.  Lactate was mildly elevated at 2, procalcitonin was not elevated.  Troponin 29 => 36. Antibiotics have been stopped as PNA is unlikely.   Review of Systems: All systems reviewed and negative except as per HPI.   Home Medications Prior to Admission medications   Medication Sig Start Date End Date  Taking? Authorizing Provider  acetaminophen (TYLENOL) 500 MG tablet Take 1,000 mg by mouth every 6 (six) hours as needed for mild pain.    Yes [provider]  albuterol (PROVENTIL) (2.5 MG/3ML) 0.083% nebulizer solution Take 2.5 mg by nebulization every 6 (six) hours as needed for wheezing or shortness of breath.   Yes [provider]  aspirin EC 81 MG tablet Take 81 mg by mouth daily.  12/03/16  Yes [provider]  atorvastatin (LIPITOR) 40 MG tablet Take 1 tablet (40 mg total) by mouth daily at 6 PM. Patient taking differently: Take 80 mg by mouth daily at 6 PM.  03/15/17  Yes Dunn, Dayna N, PA-C  buPROPion (WELLBUTRIN SR) 100 MG 12 hr tablet Take 100 mg by mouth 2 (two) times daily.   Yes [provider]  carvedilol (COREG) 6.25 MG tablet Take 1 tablet (6.25 mg total) by mouth 2 (two) times daily with a meal. 05/18/18 08/07/19 Yes Rodolph Bong, MD  cholecalciferol (VITAMIN D3) 25 MCG (1000 UT) tablet Take 1,000 Units by mouth daily.   Yes [provider]  gabapentin (NEURONTIN) 300 MG capsule Take 1 capsule (300 mg total) by mouth every 8 (eight) hours. 01/18/18  Yes Edward Jolly, MD  guaiFENesin (MUCINEX) 600 MG 12 hr tablet Take 600 mg by mouth 2 (two) times daily as needed for cough or to loosen phlegm.   Yes [provider]  HYDROcodone-acetaminophen [facility]) [id number]  MG tablet Take 1 tablet by mouth 3 (three) times daily. 06/07/18  Yes Shon Hale, MD  hydroxychloroquine (PLAQUENIL) 200 MG tablet Take 1 tablet (200 mg total) by mouth 2 (two) times daily. 06/22/18  Yes Rodolph Bong, MD  magnesium hydroxide (MILK OF MAGNESIA) 400 MG/5ML suspension Take 30 mLs by mouth daily as needed for mild constipation. And at bedtime   Yes [provider]  Menthol-Methyl Salicylate (MUSCLE RUB EX) Apply 1 application topically as needed (for shoulder or knee pain).    Yes [provider]  mirtazapine (REMERON) 7.5 MG tablet Take  7.5 mg by mouth at bedtime.   Yes [provider]  nicotine (NICODERM CQ - DOSED IN MG/24 HR) 7 mg/24hr patch Place 1 patch (7 mg total) onto the skin daily. 04/13/18  Yes Zigmund Daniel., MD  nutrition supplement, JUVEN, (JUVEN) PACK Take 1 packet by mouth 2 (two) times daily between meals. 05/18/18  Yes Rodolph Bong, MD  nystatin cream (MYCOSTATIN) Apply topically 2 (two) times daily. Patient taking differently: Apply 1 application topically 2 (two) times daily.  05/18/18  Yes Rodolph Bong, MD  polyethylene glycol [facility] Ethelene Hal) packet Take 17 g by mouth 2 (two) times daily. Until having 1 soft BM daily, then can decrease to daily or as needed Patient taking differently: Take 17 g by mouth daily. Until having 1 soft BM daily, then can decrease to daily or as needed 04/12/18  Yes Zigmund Daniel., MD  potassium chloride SA (K-DUR,KLOR-CON) 20 MEQ tablet Take 2 tablets (40 mEq total) by mouth daily. Patient taking differently: Take 20 mEq by mouth 2 (two) times daily.  05/19/18  Yes Rodolph Bong, MD  saccharomyces boulardii (FLORASTOR) 250 MG capsule Take 1 capsule (250 mg total) by mouth 2 (two) times daily. 05/18/18  Yes Rodolph Bong, MD  tamsulosin (FLOMAX) 0.4 MG CAPS capsule Take 1 capsule (0.4 mg total) by mouth daily after supper. 06/07/18  Yes Emokpae, Courage, MD  PROVENTIL HFA 108 (90 Base) MCG/ACT inhaler INHALE 2 PUFFS EVERY 6 HOURS AS NEEDED FOR WHEEZE/SHORTNESS OF BREATH Patient taking differently: Inhale 2 puffs into the lungs every 6 (six) hours as needed for wheezing or shortness of breath.  03/29/18   Kalman Shan, MD    Past Medical History: 1. Rheumatoid arthritis: Followed at [facility]  On Plaquenil. 2. Interstitial lung disease: Though to be RA-related, followed by Dr. [name].  He has not been using oxygen at home.  3. H/o CVA 4. AAA: [place] in 10/19 with 4.6 cm AAA.  5. 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  6. Carotid stenosis: Right CEA in 1/18.  7. CAD: s/p CABG x 4 in 1993.  - LHC in 4/18 with occluded LM/LCx/LAD/ramus, occluded seq SVG-OM/ramus, occluded SVG-RCA.  70% proximal RCA, patent LIMA-LAD, 70% distal LAD.  8. Chronic systolic CHF: Ischemic cardiomyopathy.  - Echo (6/17): EF 30-35%, moderate LVH.  - Echo (10/20): EF 25-30%  9. COPD: History of smoking.   Past Surgical History: Past Surgical History:  Procedure Laterality Date   CARDIAC CATHETERIZATION     CAROTID ENDARTERECTOMY Left    Heart Bypass  10/1992   INCISION AND DRAINAGE PERIRECTAL ABSCESS Right 05/09/2018   Procedure: IRRIGATION AND DEBRIDEMENT  RIGHT BUTTOCK ABSCESS;  Surgeon: Darnell Level, MD;  Location: [facility];  Service: General;  Laterality: Right;   LEFT HEART CATH AND CORS/GRAFTS ANGIOGRAPHY N/A 03/01/2017   Procedure: Left Heart  Cath and Cors/Grafts Angiography;  Surgeon: Belva Crome, MD;  Location: [place] [facility];  Service: Cardiovascular;  Laterality: N/A;   Throat Biopsy     Cat scratch fever    Family History: Family History  Problem Relation Age of Onset   Heart murmur Mother    Heart disease Mother    Heart attack Mother    Suicidality Father    Heart attack Father    Stroke Brother    Heart attack Brother    Heart attack Maternal Grandmother    Cancer Maternal Grandmother        unknown type   Lung cancer Maternal Aunt    Lung cancer Maternal Uncle    Cancer Maternal Aunt        unknown cancer   Heart attack Brother     Social History: Social History   Socioeconomic History   Marital status: Single    Spouse name: Not on file   Number of children: 1   Years of education: Not on file   Highest education level: Not on file  Occupational History   Occupation: unemployeed    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  Social Designer, fashion/clothing strain: Not on file   Food insecurity    Worry: Not on file    Inability: Not on file    Transportation needs    Medical: Not on file    Non-medical: Not on file  Tobacco Use   Smoking status: Current Every Day Smoker    Packs/day: 1.00    Years: 47.00    Pack years: 47.00    Types: Cigarettes   Smokeless tobacco: Never Used   Tobacco comment: 0.5 pack daily or less now  Substance and Sexual Activity   Alcohol use: No    Comment: remote history of beer drinking   Drug use: No   Sexual activity: Not on file  Lifestyle   Physical activity    Days per week: Not on file    Minutes per session: Not on file   Stress: Not on file  Relationships   Social connections    Talks on phone: Not on file    Gets together: Not on file    Attends religious service: Not on file    Active member of club or organization: Not on file    Attends meetings of clubs or organizations: Not on file    Relationship status: Not on file  Other Topics Concern   Not on file  Social History Narrative   Not on file    Allergies:  No Known Allergies  Objective:    Vital Signs:   Temp:  [97.6 F (36.4 C)-98.4 F (36.9 C)] 97.6 F (36.4 C) (10/07 0755) Pulse Rate:  [64-71] 64 (10/07 0755) Resp:  [20-24] 24 (10/07 0755) BP: (96-102)/(60-66) 102/66 (10/07 0755) SpO2:  [98 %-100 %] 99 % (10/07 0755) Last BM Date: 08/08/19(per pt)  Weight change: Filed Weights   08/07/19 0402  Weight: 70.3 kg    Intake/Output:   Intake/Output Summary (Last 24 hours) at 08/09/2019 1115 Last data filed at 08/09/2019 0400 Gross per 24 hour  Intake 3346.17 ml  Output 500 ml  Net 2846.17 ml      Physical Exam    General:  Well appearing. No resp difficulty HEENT: normal Neck: supple. JVP 10-12 cm. Carotids 2+ bilat; no bruits. No lymphadenopathy or thyromegaly appreciated. Cor: PMI nondisplaced. Regular rate & rhythm. No rubs, gallops. 2/6  HSM LLSB/apex.  Lungs: Dry crackles at bases.  Abdomen: soft, nontender, nondistended. No hepatosplenomegaly. No bruits or masses. Good bowel  sounds. Extremities: no cyanosis, clubbing, rash, edema Neuro: alert & orientedx3, cranial nerves grossly intact. moves all 4 extremities w/o difficulty. Affect pleasant   Telemetry   NSR in 60s (personally reviewed).   EKG    Sinus tachy with IVCD, rate 126 msec. Personally reviewed  Labs   Basic Metabolic Panel: Recent Labs  Lab 08/07/19 0405 08/08/19 0406  NA 136 138  K 3.9 4.0  CL 103 106  CO2 19* 24  GLUCOSE 140* 172*  BUN 10 20  CREATININE 0.93 0.94  CALCIUM 8.3* 8.3*    Liver Function Tests: Recent Labs  Lab 08/07/19 0405  AST 19  ALT 15  ALKPHOS 79  BILITOT 1.7*  PROT 7.2  ALBUMIN 2.4*   No results for input(s): LIPASE, AMYLASE in the last 168 hours. No results for input(s): AMMONIA in the last 168 hours.  CBC: Recent Labs  Lab 08/07/19 0405 08/08/19 0406 08/09/19 0407  WBC 9.9 5.5 3.4*  NEUTROABS 8.2* 4.3  --   HGB 13.3 10.7* 9.7*  HCT 41.8 33.9* 32.5*  MCV 82.3 82.3 86.2  PLT 240 195 174    Cardiac Enzymes: No results for input(s): CKTOTAL, CKMB, CKMBINDEX, TROPONINI in the last 168 hours.  BNP: BNP (last 3 results) Recent Labs    08/07/19 0405  BNP 304.3*    ProBNP (last 3 results) No results for input(s): PROBNP in the last 8760 hours.   CBG: No results for input(s): GLUCAP in the last 168 hours.  Coagulation Studies: No results for input(s): LABPROT, INR in the last 72 hours.   Imaging   Dg Chest Port 1 View  Result Date: 08/09/2019 CLINICAL DATA:  Hypoxia EXAM: PORTABLE CHEST 1 VIEW COMPARISON:  08/07/2019 FINDINGS: Postsurgical changes to the sternum and mediastinum. Stable cardiomegaly. Diffuse coarsened interstitial markings compatible with known chronic interstitial lung disease. No superimposed new airspace consolidation. No large pleural fluid collection. No pneumothorax. IMPRESSION: Extensive chronic interstitial lung changes without a superimposed airspace consolidation. Electronically Signed   By: Duanne GuessNicholas   Plundo M.D.   On: 08/09/2019 10:48      Medications:     Current Medications:  aspirin EC  81 mg Oral Daily   atorvastatin  80 mg Oral q1800   buPROPion  100 mg Oral BID   carvedilol  3.125 mg Oral BID WC   Chlorhexidine Gluconate Cloth  6 each Topical Daily   enoxaparin (LOVENOX) injection  40 mg Subcutaneous Q24H   furosemide  40 mg Intravenous BID   gabapentin  300 mg Oral Q8H   HYDROcodone-acetaminophen  1 tablet Oral TID   hydroxychloroquine  200 mg Oral BID   ipratropium-albuterol  3 mL Nebulization BID   mirtazapine  7.5 mg Oral QHS   multivitamin with minerals  1 tablet Oral Daily   nicotine  7 mg Transdermal Daily   nutrition supplement (JUVEN)  1 packet Oral BID BM   polyethylene glycol  17 g Oral Daily   potassium chloride  20 mEq Oral Once   [START ON 08/10/2019] predniSONE  50 mg Oral Q breakfast   saccharomyces boulardii  250 mg Oral BID   sodium chloride flush  3 mL Intravenous Q12H   tamsulosin  0.4 mg Oral QPC supper     Infusions:    Assessment/Plan   1. Acute hypoxemic respiratory failure: Patient was admitted with dyspnea, hypoxemia, and  chest pain.  I do not think that he has an infectious PNA, no fever and normal PCT.  He is now off antibiotics.  No PE.  High resolution CT chest showed evidence for some worsening of ILD compared to 2018 CT, also with superimposed pulmonary edema versus acute inflammation.  °- Agree with steroid burst for possible ILD flare.  °- I am concerned that he is volume overloaded as well, with CHF exacerbation playing a role in his presentation. I will start Lasix 40 mg IV bid for now and follow response.  °2. Acute on chronic systolic CHF: Ischemic cardiomyopathy. Echo this admission with EF 25-30%, somewhat lower than prior.  On exam, I think he does appear to volume overload (JVD) though exam is somewhat difficult.   °- I will treat with Lasix 40 mg IV bid as above and follow response.   °- With soft BP,  decrease Coreg to 3.125 mg bid.  °- Ideally, would start spironolactone and ACEI/ARB/ARNI, but will need to follow BP trend.  °- To help determine the contribution to current presentation from CHF (versus ILD), I will arrange for RHC tomorrow. This will also allow evaluation of cardiac output in light of mildly elevated lactate.   Discussed risks/benefits with patient and his sister, they agree to study.  °- Strict I/Os and daily weights.  °3. CAD: s/p CABG x 3.  Last cath as above in 2018, only the RCA and LIMA-LAD remain patent. He had significant chest tightness prior to admission though he did not have a significant troponin elevation.   °- Given question of contribution of heart disease to current presentation, will do coronary angiography with RHC tomorrow (discussed this as well with patient and sister).   °- Continue ASA 81 and atorvastatin 80 daily.  °4. ILD: High resolution CT with some progression of ILD as above.  °- Treated with steroid taper.  °5. RA: On Plaquenil. °6. H/o CVA °7. H/o AAA: 4.6 cm in 10/19, will need repeat evaluation soon.  °8. Carotid stenosis: s/p right CEA.  °9. Prominent murmur in mitral area, but only mild-moderate MR reported on echo.  Will need to review.  ° °Length of Stay: 2 ° °Parys Elenbaas, MD  °08/09/2019, 11:15 AM ° °Advanced Heart Failure Team °Pager [phone] (M-F; 7a - 4p)  °Please contact [facility] for night-coverage after hours (4p -7a ) and weekends on [email or web address] ° °

## 2019-08-09 NOTE — Progress Notes (Addendum)
PROGRESS NOTE    [name]  LTJ:[id number] DOB: August 22, 1956 DOA: 08/07/2019 PCP: [name], PA-C   Brief Narrative: Patient is a 63 year old male with history of CVA, rheumatoid arthritis, coronary artery disease, interstitial lung disease, COPD, combined congestive heart failure, abdominal aortic aneurysm who presented with shortness of breath.  He follows with [name] as an outpatient.  He follows with rheumatology for his rheumatoid arthritis.  He presented with shortness of breath.  He is on 2 L of oxygen at night for OSA.  On presentation ,he required 5 to 6 L of oxygen per minute.  Chest x-ray was concerning for possible infiltrates.  COVID-19 was negative .currently being managed for interstitial lung disease secondary to rheumatoid arthritis.  PCCM following.  Echocardiogram showed decreased ejection fraction from last echo with current ejection fraction of 20 to 25%.  Cardiology also consulted today.  Assessment & Plan:   Principal Problem:   Acute on chronic respiratory failure with hypoxia (HCC) Active Problems:   ILD (interstitial lung disease) (HCC)   HTN (hypertension)   Current smoker   Chronic combined systolic and diastolic CHF (congestive heart failure) (HCC)   Chronic pain syndrome   Rheumatoid arthritis (HCC)   Acute hypoxic respiratory failure: Secondary to progression of his ILD due to rheumatoid arthritis.  Chest x-ray was initially suspicious for pneumonia.  CT angiogram did not show any pulmonary embolism.  High resolution chest CT showed similar pulmonary fibrosis, bronchiectasis, peribronchovascular groundglass.  Also showed new nodule of the right pulmonary apex measuring 9 mm.  Procalcitonin normal.  Pneumonia suspected to be less likely so antibiotics discontinued.  Currently on steroids.  He is on 1 L of oxygen per minute today.  PCCM following. PCCM recommended 3 weeks of steroid taper, Lasix.Patient will follow-up with ILD clinic.  We will  check if he requires  home oxygen. I will also request for PT evaluation.  Chronic combined congestive heart failure: Echo on 6/19 showed ejection fraction of 30 to 07%, grade 2 diastolic dysfunction.  BNP is not significant elevated. Continue carvedilol.  He is not on diuretics at home.We will start on lasix 40 mg daily Echo done here showed ejection is on 20-25%.  Will request for cardiac evaluation.  Rheumatoid arthritis: On Plaquenil.  Follows with rheumatology as an outpatient.  Hypertension: Current blood pressure stable.  Continue current regimen  Tobacco dependence: Counseled for cessation.  Hyperlipidemia: Continue Lipitor         DVT prophylaxis: Lovenox Code Status:Full  Family Communication: None present at the bedside Disposition Plan: Likely home after cardiology evaluation   Consultants: PCCM  Procedures:None  Antimicrobials:  Anti-infectives (From admission, onward)   Start     Dose/Rate Route Frequency Ordered Stop   08/07/19 1015  hydroxychloroquine (PLAQUENIL) tablet 200 mg     200 mg Oral 2 times daily 08/07/19 0925     08/07/19 0930  cefTRIAXone (ROCEPHIN) 2 g in sodium chloride 0.9 % 100 mL IVPB  Status:  Discontinued     2 g 200 mL/hr over 30 Minutes Intravenous Every 24 hours 08/07/19 0925 08/08/19 1143   08/07/19 0930  azithromycin (ZITHROMAX) 500 mg in sodium chloride 0.9 % 250 mL IVPB  Status:  Discontinued     500 mg 250 mL/hr over 60 Minutes Intravenous Every 24 hours 08/07/19 0925 08/08/19 1143   08/07/19 0645  vancomycin (VANCOCIN) IVPB 1000 mg/200 mL premix     1,000 mg 200 mL/hr over 60 Minutes Intravenous  Once 08/07/19 0637 08/07/19 1021   08/07/19 0645  piperacillin-tazobactam (ZOSYN) IVPB 3.375 g  Status:  Discontinued     3.375 g 12.5 mL/hr over 240 Minutes Intravenous  Once 08/07/19 [id number] 08/07/19 [id number]      Subjective: Patient seen and examined bedside this morning.  Appears more comfortable today.  Currently on 1 L of oxygen per  minute.  Denies shortness of breath or chest pain.  Objective: Vitals:   08/08/19 2301 08/09/19 0336 08/09/19 0735 08/09/19 0755  BP:    102/66  Pulse:    64  Resp:    (!) 24  Temp: 98.4 F (36.9 C) 98.3 F (36.8 C)  97.6 F (36.4 C)  TempSrc: Oral Oral  Oral  SpO2:   100% 99%  Weight:      Height:        Intake/Output Summary (Last 24 hours) at 08/09/2019 [id number] Last data filed at 08/09/2019 0400 Gross per 24 hour  Intake 3346.17 ml  Output 500 ml  Net 2846.17 ml   Filed Weights   08/07/19 0402  Weight: 70.3 kg    Examination:  General exam: Deconditioned, debilitated male, appears older than age  HEENT:PERRL,Oral mucosa moist, Ear/Nose normal on gross exam Respiratory system: Bilateral coarse crackles  Cardiovascular system: S1 & S2 heard, RRR. No JVD, murmurs, rubs, gallops or clicks. No pedal edema. Gastrointestinal system: Abdomen is nondistended, soft and nontender. No organomegaly or masses felt. Normal bowel sounds heard. Central nervous system: Alert and oriented. No focal neurological deficits. Extremities: No edema, no clubbing ,no cyanosis, distal peripheral pulses palpable. Skin: No rashes, lesions or ulcers,no icterus ,no pallor      Data Reviewed: I have personally reviewed following labs and imaging studies  CBC: Recent Labs  Lab 08/07/19 0405 08/08/19 0406 08/09/19 0407  WBC 9.9 5.5 3.4*  NEUTROABS 8.2* 4.3  --   HGB 13.3 10.7* 9.7*  HCT 41.8 33.9* 32.5*  MCV 82.3 82.3 86.2  PLT 240 195 174   Basic Metabolic Panel: Recent Labs  Lab 08/07/19 0405 08/08/19 0406  NA 136 138  K 3.9 4.0  CL 103 106  CO2 19* 24  GLUCOSE 140* 172*  BUN 10 20  CREATININE 0.93 0.94  CALCIUM 8.3* 8.3*   GFR: Estimated Creatinine Clearance: 80 mL/min (by C-G formula based on SCr of 0.94 mg/dL). Liver Function Tests: Recent Labs  Lab 08/07/19 0405  AST 19  ALT 15  ALKPHOS 79  BILITOT 1.7*  PROT 7.2  ALBUMIN 2.4*   No results for input(s): LIPASE,  AMYLASE in the last 168 hours. No results for input(s): AMMONIA in the last 168 hours. Coagulation Profile: No results for input(s): INR, PROTIME in the last 168 hours. Cardiac Enzymes: No results for input(s): CKTOTAL, CKMB, CKMBINDEX, TROPONINI in the last 168 hours. BNP (last 3 results) No results for input(s): PROBNP in the last 8760 hours. HbA1C: No results for input(s): HGBA1C in the last 72 hours. CBG: No results for input(s): GLUCAP in the last 168 hours. Lipid Profile: No results for input(s): CHOL, HDL, LDLCALC, TRIG, CHOLHDL, LDLDIRECT in the last 72 hours. Thyroid Function Tests: No results for input(s): TSH, T4TOTAL, FREET4, T3FREE, THYROIDAB in the last 72 hours. Anemia Panel: No results for input(s): VITAMINB12, FOLATE, FERRITIN, TIBC, IRON, RETICCTPCT in the last 72 hours. Sepsis Labs: Recent Labs  Lab 08/07/19 0405 08/07/19 [id number] 08/08/19 0406 08/08/19 0832 08/08/19 1049 08/09/19 0407  PROCALCITON  --   --  <0.10  --   --  <  0.10  LATICACIDVEN 2.7* 2.0*  --  1.4 2.0*  --     Recent Results (from the past 240 hour(s))  SARS Coronavirus 2 [facility] order, Performed in [facility] hospital lab) Nasopharyngeal Nasopharyngeal Swab     Status: None   Collection Time: 08/07/19  4:15 AM   Specimen: Nasopharyngeal Swab  Result Value Ref Range Status   SARS Coronavirus 2 NEGATIVE NEGATIVE Final    Comment: (NOTE) If result is NEGATIVE SARS-CoV-2 target nucleic acids are NOT DETECTED. The SARS-CoV-2 RNA is generally detectable in upper and lower  respiratory specimens during the acute phase of infection. The lowest  concentration of SARS-CoV-2 viral copies this assay can detect is 250  copies / mL. A negative result does not preclude SARS-CoV-2 infection  and should not be used as the sole basis for treatment or other  patient management decisions.  A negative result may occur with  improper specimen collection / handling, submission of specimen other  than  nasopharyngeal swab, presence of viral mutation(s) within the  areas targeted by this assay, and inadequate number of viral copies  (<250 copies / mL). A negative result must be combined with clinical  observations, patient history, and epidemiological information. If result is POSITIVE SARS-CoV-2 target nucleic acids are DETECTED. The SARS-CoV-2 RNA is generally detectable in upper and lower  respiratory specimens dur ing the acute phase of infection.  Positive  results are indicative of active infection with SARS-CoV-2.  Clinical  correlation with patient history and other diagnostic information is  necessary to determine patient infection status.  Positive results do  not rule out bacterial infection or co-infection with other viruses. If result is PRESUMPTIVE POSTIVE SARS-CoV-2 nucleic acids MAY BE PRESENT.   A presumptive positive result was obtained on the submitted specimen  and confirmed on repeat testing.  While 2019 novel coronavirus  (SARS-CoV-2) nucleic acids may be present in the submitted sample  additional confirmatory testing may be necessary for epidemiological  and / or clinical management purposes  to differentiate between  SARS-CoV-2 and other Sarbecovirus currently known to infect humans.  If clinically indicated additional testing with an alternate test  methodology [phone]) is advised. The SARS-CoV-2 RNA is generally  detectable in upper and lower respiratory sp ecimens during the acute  phase of infection. The expected result is Negative. Fact Sheet for Patients:  BoilerBrush.com.cy Fact Sheet for Healthcare Providers: https://pope.com/ This test is not yet approved or cleared by the [place] FDA and has been authorized for detection and/or diagnosis of SARS-CoV-2 by FDA under an Emergency Use Authorization (EUA).  This EUA will remain in effect (meaning this test can be used) for the duration of  the COVID-19 declaration under Section 564(b)(1) of the Act, 21 U.S.C. section 360bbb-3(b)(1), unless the authorization is terminated or revoked sooner. Performed at [facility], [address], [place], [place] [address]   Respiratory Panel by PCR     Status: None   Collection Time: 08/07/19  6:32 PM   Specimen: Nasopharyngeal Swab; Respiratory  Result Value Ref Range Status   Adenovirus NOT DETECTED NOT DETECTED Final   Coronavirus 229E NOT DETECTED NOT DETECTED Final    Comment: (NOTE) The Coronavirus on the Respiratory Panel, DOES NOT test for the novel  Coronavirus (2019 nCoV)    Coronavirus HKU1 NOT DETECTED NOT DETECTED Final   Coronavirus NL63 NOT DETECTED NOT DETECTED Final   Coronavirus OC43 NOT DETECTED NOT DETECTED Final   Metapneumovirus NOT DETECTED NOT  DETECTED Final   Rhinovirus / Enterovirus NOT DETECTED NOT DETECTED Final   Influenza A NOT DETECTED NOT DETECTED Final   Influenza B NOT DETECTED NOT DETECTED Final   Parainfluenza Virus 1 NOT DETECTED NOT DETECTED Final   Parainfluenza Virus 2 NOT DETECTED NOT DETECTED Final   Parainfluenza Virus 3 NOT DETECTED NOT DETECTED Final   Parainfluenza Virus 4 NOT DETECTED NOT DETECTED Final   Respiratory Syncytial Virus NOT DETECTED NOT DETECTED Final   Bordetella pertussis NOT DETECTED NOT DETECTED Final   Chlamydophila pneumoniae NOT DETECTED NOT DETECTED Final   Mycoplasma pneumoniae NOT DETECTED NOT DETECTED Final    Comment: Performed at [facility], [address], [place], [place] [address]         Radiology Studies: Ct Angio Chest Pe W Or Wo Contrast  Result Date: 08/07/2019 CLINICAL DATA:  Worsening acute on chronic respiratory failure. History of rheumatoid arthritis related interstitial lung disease. EXAM: CT ANGIOGRAPHY CHEST WITH CONTRAST TECHNIQUE: Multidetector CT imaging of the chest was performed using the standard protocol during bolus administration of intravenous contrast.  Multiplanar CT image reconstructions and MIPs were obtained to evaluate the vascular anatomy. CONTRAST:  OMNIPAQUE IOHEXOL 350 MG/ML SOLN COMPARISON:  High-resolution chest CT dated August 17, 2017. FINDINGS: Cardiovascular: Satisfactory opacification of the pulmonary arteries to the segmental level. No evidence of pulmonary embolism. Unchanged moderate cardiomegaly. No pericardial effusion. No thoracic aortic aneurysm. Coronary, aortic arch, and branch vessel atherosclerotic vascular disease. Prior CABG. Mediastinum/Nodes: Multiple prominent subcentimeter and borderline enlarged mediastinal and hilar lymph nodes are similar to prior study and likely reactive. No enlarged axillary lymph nodes. The thyroid gland, trachea, and esophagus demonstrate no significant findings. Lungs/Pleura: New small right pleural effusion. Patchy areas of ground-glass density, septal thickening, subpleural reticulation, mild bronchiectasis and bronchiolectasis, and mild honeycombing are again noted, with slightly more prominent interlobular septal thickening and hazy ground-glass density when compared to prior study from October 2018. No consolidation or pneumothorax. Upper Abdomen: No acute abnormality. Punctate right nephrolithiasis. Musculoskeletal: No chest wall abnormality. No acute or significant osseous findings. Review of the MIP images confirms the above findings. IMPRESSION: 1.  No evidence of pulmonary embolism. 2. Chronic interstitial lung disease. Slightly more prominent interlobular septal thickening and hazy ground-glass density throughout both lungs when compared to the prior study may reflect a degree of pulmonary edema given new small right pleural effusion. 3.  Aortic atherosclerosis (ICD10-I70.0). Electronically Signed   By: Obie Dredge M.D.   On: 08/07/2019 22:25   Ct Chest High Resolution  Result Date: 08/08/2019 CLINICAL DATA:  Acute on chronic respiratory failure, interstitial lung disease, known  rheumatoid associated ILD EXAM: CT CHEST WITHOUT CONTRAST TECHNIQUE: Multidetector CT imaging of the chest was performed following the standard protocol without intravenous contrast. High resolution imaging of the lungs was performed. Inspiratory and expiratory phase imaging was not submitted for review. COMPARISON:  08/17/2017 04/05/2017, 02/09/2017 FINDINGS: Cardiovascular: Aortic atherosclerosis. Aortic valve calcifications. Cardiomegaly with extensive 3 vessel coronary artery calcifications and/or stents status post median sternotomy and CABG. No pericardial effusion. Enlargement of the main pulmonary artery up to 3.9 cm. Mediastinum/Nodes: Unchanged enlarged mediastinal lymph nodes. Thyroid gland, trachea, and esophagus demonstrate no significant findings. Lungs/Pleura: There is moderate to severe pulmonary fibrosis in a pattern without clear gradient, featuring predominantly tubular bronchiectasis, extensive subpleural and peribronchovascular bronchiolectasis, and extensive peribronchovascular ground-glass. There are possible areas of honeycombing, for example in the lateral segment right middle lobe. Findings, particularly bronchiolectasis, are slightly  worsened over time in comparison to examination dated 08/17/2017, although there may be some component of acutely superimposed ground-glass which is difficult to account for. There is mild, diffuse interlobular septal thickening the extent that the septa remain anatomically intact. There is a new nodule of the right pulmonary apex measuring 9 mm (series 8, image 39). There are other areas of less well-defined new ground-glass, for example in the left apex (series 12, image 83). Small right, trace left pleural effusions. Upper Abdomen: No acute abnormality. Musculoskeletal: No chest wall mass or suspicious bone lesions identified. IMPRESSION: 1. There is moderate to severe pulmonary fibrosis in a pattern without clear gradient, featuring predominantly tubular  bronchiectasis, extensive subpleural and peribronchovascular bronchiolectasis, and extensive peribronchovascular ground-glass. There are possible areas of honeycombing, for example in the lateral segment right middle lobe. Findings, particularly bronchiolectasis, are slightly worsened over time in comparison to examination dated 08/17/2017, although there may be some component of acutely superimposed ground-glass which is difficult to account for. Findings remain generally in keeping with known diagnosis of rheumatoid associated ILD. 2. Mild, diffuse interlobular septal thickening and small right, trace left pleural effusions. There are scattered areas of new ground-glass, for example in the left pulmonary apex. Findings consistent with edema, atypical/viral infection, or acute inflammatory flare of interstitial lung disease. 3. There is a new nodule of the right pulmonary apex measuring 9 mm (series 8, image 39). Recommend CT follow-up in 3-6 months to assess for stability or resolution of this possibly infectious or inflammatory airspace opacity. 4.  Coronary artery disease.  Aortic Atherosclerosis (ICD10-I70.0). 5. Enlargement of the main pulmonary artery, as can be pulmonary hypertension. Electronically Signed   By: Lauralyn PrimesAlex  Bibbey M.D.   On: 08/08/2019 08:48        Scheduled Meds:  aspirin EC  81 mg Oral Daily   atorvastatin  80 mg Oral q1800   buPROPion  100 mg Oral BID   carvedilol  6.25 mg Oral BID WC   Chlorhexidine Gluconate Cloth  6 each Topical Daily   enoxaparin (LOVENOX) injection  40 mg Subcutaneous Q24H   gabapentin  300 mg Oral Q8H   HYDROcodone-acetaminophen  1 tablet Oral TID   hydroxychloroquine  200 mg Oral BID   ipratropium-albuterol  3 mL Nebulization TID   methylPREDNISolone (SOLU-MEDROL) injection  60 mg Intravenous Q12H   mirtazapine  7.5 mg Oral QHS   multivitamin with minerals  1 tablet Oral Daily   nicotine  7 mg Transdermal Daily   nutrition supplement  (JUVEN)  1 packet Oral BID BM   polyethylene glycol  17 g Oral Daily   saccharomyces boulardii  250 mg Oral BID   tamsulosin  0.4 mg Oral QPC supper   Continuous Infusions:   LOS: 2 days    Time spent:25 mins. More than 50% of that time was spent in counseling and/or coordination of care.      Burnadette PopAmrit Brandi Armato, MD [facility] Pager [phone]  If 7PM-7AM, please contact night-coverage [email or web address] Password [email or web address] 08/09/2019, 8:52 AM

## 2019-08-10 ENCOUNTER — Encounter (HOSPITAL_COMMUNITY)
Admission: EM | Disposition: A | Discharge status: Home Health | Payer: Self-pay | Source: Home / Self Care | Attending: Internal Medicine

## 2019-08-10 DIAGNOSIS — I2510 Atherosclerotic heart disease of native coronary artery without angina pectoris: Secondary | ICD-10-CM

## 2019-08-10 HISTORY — PX: CORONARY STENT INTERVENTION: CATH118234

## 2019-08-10 HISTORY — PX: RIGHT/LEFT HEART CATH AND CORONARY/GRAFT ANGIOGRAPHY: CATH118267

## 2019-08-10 LAB — BASIC METABOLIC PANEL
Anion gap: 9 (ref 5–15)
BUN: 23 mg/dL (ref 8–23)
CO2: 28 mmol/L (ref 22–32)
Calcium: 8.1 mg/dL — ABNORMAL LOW (ref 8.9–10.3)
Chloride: 104 mmol/L (ref 98–111)
Creatinine, Ser: 0.93 mg/dL (ref 0.61–1.24)
GFR calc Af Amer: >60 mL/min (ref >60)
GFR calc non Af Amer: >60 mL/min (ref >60)
Glucose, Bld: 128 mg/dL — ABNORMAL HIGH (ref 70–99)
Potassium: 3.9 mmol/L (ref 3.5–5.1)
Sodium: 141 mmol/L (ref 135–145)

## 2019-08-10 LAB — POCT I-STAT EG7
Acid-Base Excess: 3 mmol/L — ABNORMAL HIGH (ref 0.0–2.0)
Acid-Base Excess: 3 mmol/L — ABNORMAL HIGH (ref 0.0–2.0)
Bicarbonate: 29.2 mmol/L — ABNORMAL HIGH (ref 20.0–28.0)
Bicarbonate: 29.9 mmol/L — ABNORMAL HIGH (ref 20.0–28.0)
Calcium, Ion: 1.21 mmol/L (ref 1.15–1.40)
Calcium, Ion: 1.24 mmol/L (ref 1.15–1.40)
HCT: 30 % — ABNORMAL LOW (ref 39.0–52.0)
HCT: 31 % — ABNORMAL LOW (ref 39.0–52.0)
Hemoglobin: 10.2 g/dL — ABNORMAL LOW (ref 13.0–17.0)
Hemoglobin: 10.5 g/dL — ABNORMAL LOW (ref 13.0–17.0)
O2 Saturation: 58 %
O2 Saturation: 56 %
Potassium: 3.7 mmol/L (ref 3.5–5.1)
Potassium: 3.8 mmol/L (ref 3.5–5.1)
Sodium: 144 mmol/L (ref 135–145)
Sodium: 143 mmol/L (ref 135–145)
TCO2: 31 mmol/L (ref 22–32)
TCO2: 32 mmol/L (ref 22–32)
pCO2, Ven: 53.8 mmHg (ref 44.0–60.0)
pCO2, Ven: 55.8 mmHg (ref 44.0–60.0)
pH, Ven: 7.342 (ref 7.250–7.430)
pH, Ven: 7.337 (ref 7.250–7.430)
pO2, Ven: 33 mmHg (ref 32.0–45.0)
pO2, Ven: 32 mmHg (ref 32.0–45.0)

## 2019-08-10 LAB — POCT I-STAT 7, (LYTES, BLD GAS, ICA,H+H)
Bicarbonate: 25.9 mmol/L (ref 20.0–28.0)
Calcium, Ion: 1.16 mmol/L (ref 1.15–1.40)
HCT: 33 % — ABNORMAL LOW (ref 39.0–52.0)
Hemoglobin: 11.2 g/dL — ABNORMAL LOW (ref 13.0–17.0)
O2 Saturation: 93 %
Potassium: 3.8 mmol/L (ref 3.5–5.1)
Sodium: 143 mmol/L (ref 135–145)
TCO2: 27 mmol/L (ref 22–32)
pCO2 arterial: 44.4 mmHg (ref 32.0–48.0)
pH, Arterial: 7.374 (ref 7.350–7.450)
pO2, Arterial: 69 mmHg — ABNORMAL LOW (ref 83.0–108.0)

## 2019-08-10 LAB — LEGIONELLA PNEUMOPHILA SEROGP 1 UR AG: L. pneumophila Serogp 1 Ur Ag: NEGATIVE

## 2019-08-10 LAB — POCT ACTIVATED CLOTTING TIME
Activated Clotting Time: 186 seconds
Activated Clotting Time: 213 seconds
Activated Clotting Time: 444 seconds

## 2019-08-10 LAB — MRSA PCR SCREENING: MRSA by PCR: NEGATIVE

## 2019-08-10 SURGERY — RIGHT/LEFT HEART CATH AND CORONARY/GRAFT ANGIOGRAPHY
Anesthesia: LOCAL

## 2019-08-10 MED ORDER — AMIODARONE HCL 150 MG/3ML IV SOLN
INTRAVENOUS | Status: DC | PRN
  Administered 2019-08-10: 150 mg via INTRAVENOUS

## 2019-08-10 MED ORDER — ASPIRIN 81 MG PO CHEW
81.0000 mg | CHEWABLE_TABLET | ORAL | Status: AC
  Administered 2019-08-10: 81 mg via ORAL
  Filled 2019-08-10: qty 1

## 2019-08-10 MED ORDER — ALBUTEROL SULFATE (2.5 MG/3ML) 0.083% IN NEBU
INHALATION_SOLUTION | RESPIRATORY_TRACT | Status: AC
  Filled 2019-08-10: qty 3

## 2019-08-10 MED ORDER — FENTANYL CITRATE (PF) 100 MCG/2ML IJ SOLN
INTRAMUSCULAR | Status: DC | PRN
  Administered 2019-08-10: 25 ug via INTRAVENOUS

## 2019-08-10 MED ORDER — FAMOTIDINE IN NACL 20-0.9 MG/50ML-% IV SOLN
INTRAVENOUS | Status: DC | PRN
  Administered 2019-08-10: 20 mg via INTRAVENOUS

## 2019-08-10 MED ORDER — VERAPAMIL HCL 2.5 MG/ML IV SOLN
INTRAVENOUS | Status: DC | PRN
  Administered 2019-08-10: 10 mL via INTRA_ARTERIAL

## 2019-08-10 MED ORDER — ASPIRIN 81 MG PO CHEW: 81.0000 mg | CHEWABLE_TABLET | ORAL | Status: DC

## 2019-08-10 MED ORDER — SODIUM CHLORIDE 0.9 % IV SOLN
INTRAVENOUS | Status: AC | PRN
  Administered 2019-08-10: 10 mL/h via INTRAVENOUS

## 2019-08-10 MED ORDER — SODIUM CHLORIDE 0.9 % IV SOLN: INTRAVENOUS | Status: AC

## 2019-08-10 MED ORDER — LIDOCAINE HCL (PF) 1 % IJ SOLN
INTRAMUSCULAR | Status: DC | PRN
  Administered 2019-08-10 (×2): 2 mL

## 2019-08-10 MED ORDER — SODIUM CHLORIDE 0.9 % IV SOLN: 250.0000 mL | INTRAVENOUS | Status: DC | PRN

## 2019-08-10 MED ORDER — FUROSEMIDE 10 MG/ML IJ SOLN
INTRAMUSCULAR | Status: AC
  Filled 2019-08-10: qty 4

## 2019-08-10 MED ORDER — HEPARIN SODIUM (PORCINE) 1000 UNIT/ML IJ SOLN
INTRAMUSCULAR | Status: AC
  Filled 2019-08-10: qty 1

## 2019-08-10 MED ORDER — LIDOCAINE HCL (PF) 1 % IJ SOLN
INTRAMUSCULAR | Status: AC
  Filled 2019-08-10: qty 30

## 2019-08-10 MED ORDER — SODIUM CHLORIDE 0.9 % IV SOLN
INTRAVENOUS | Status: DC | PRN
  Administered 2019-08-10: 1.75 mg/kg/h via INTRAVENOUS

## 2019-08-10 MED ORDER — CLOPIDOGREL BISULFATE 300 MG PO TABS
ORAL_TABLET | ORAL | Status: DC | PRN
  Administered 2019-08-10: 600 mg via ORAL

## 2019-08-10 MED ORDER — IOHEXOL 350 MG/ML SOLN
INTRAVENOUS | Status: DC | PRN
  Administered 2019-08-10: 10:00:00 40 mL via INTRA_ARTERIAL

## 2019-08-10 MED ORDER — AMIODARONE HCL 150 MG/3ML IV SOLN
INTRAVENOUS | Status: AC
  Filled 2019-08-10: qty 3

## 2019-08-10 MED ORDER — FUROSEMIDE 10 MG/ML IJ SOLN
INTRAMUSCULAR | Status: DC | PRN
  Administered 2019-08-10 (×2): 40 mg via INTRAVENOUS

## 2019-08-10 MED ORDER — MIDAZOLAM HCL 2 MG/2ML IJ SOLN
INTRAMUSCULAR | Status: AC
  Filled 2019-08-10: qty 2

## 2019-08-10 MED ORDER — MORPHINE SULFATE (PF) 2 MG/ML IV SOLN
2.0000 mg | INTRAVENOUS | Status: DC | PRN
  Administered 2019-08-10: 2 mg via INTRAVENOUS
  Filled 2019-08-10: qty 1

## 2019-08-10 MED ORDER — FAMOTIDINE IN NACL 20-0.9 MG/50ML-% IV SOLN
INTRAVENOUS | Status: AC
  Filled 2019-08-10: qty 50

## 2019-08-10 MED ORDER — IOHEXOL 350 MG/ML SOLN
INTRAVENOUS | Status: DC | PRN
  Administered 2019-08-10: 10:00:00 90 mL via INTRA_ARTERIAL

## 2019-08-10 MED ORDER — NITROGLYCERIN 1 MG/10 ML FOR IR/CATH LAB
INTRA_ARTERIAL | Status: AC
  Filled 2019-08-10: qty 10

## 2019-08-10 MED ORDER — HEPARIN SODIUM (PORCINE) 1000 UNIT/ML IJ SOLN
INTRAMUSCULAR | Status: DC | PRN
  Administered 2019-08-10: 4000 [IU] via INTRAVENOUS
  Administered 2019-08-10: 4500 [IU] via INTRAVENOUS

## 2019-08-10 MED ORDER — NITROGLYCERIN 1 MG/10 ML FOR IR/CATH LAB
INTRA_ARTERIAL | Status: DC | PRN
  Administered 2019-08-10: 100 ug via INTRACORONARY

## 2019-08-10 MED ORDER — SODIUM CHLORIDE 0.9% FLUSH
3.0000 mL | Freq: Two times a day (BID) | INTRAVENOUS | Status: DC
  Administered 2019-08-10 – 2019-08-13 (×7): 3 mL via INTRAVENOUS

## 2019-08-10 MED ORDER — SODIUM CHLORIDE 0.9% FLUSH: 3.0000 mL | INTRAVENOUS | Status: DC | PRN

## 2019-08-10 MED ORDER — HYDRALAZINE HCL 20 MG/ML IJ SOLN: 10.0000 mg | INTRAMUSCULAR | Status: AC | PRN

## 2019-08-10 MED ORDER — HEPARIN (PORCINE) IN NACL 1000-0.9 UT/500ML-% IV SOLN
INTRAVENOUS | Status: AC
  Filled 2019-08-10: qty 1000

## 2019-08-10 MED ORDER — CLOPIDOGREL BISULFATE 75 MG PO TABS
75.0000 mg | ORAL_TABLET | Freq: Every day | ORAL | Status: DC
  Administered 2019-08-11 – 2019-08-13 (×3): 75 mg via ORAL
  Filled 2019-08-10 (×3): qty 1

## 2019-08-10 MED ORDER — BIVALIRUDIN BOLUS VIA INFUSION - CUPID
INTRAVENOUS | Status: DC | PRN
  Administered 2019-08-10: 58.2 mg via INTRAVENOUS

## 2019-08-10 MED ORDER — HEPARIN SODIUM (PORCINE) 1000 UNIT/ML IJ SOLN
INTRAMUSCULAR | Status: DC | PRN
  Administered 2019-08-10: 3000 [IU] via INTRAVENOUS

## 2019-08-10 MED ORDER — ACETAMINOPHEN 325 MG PO TABS: 650.0000 mg | ORAL_TABLET | ORAL | Status: DC | PRN

## 2019-08-10 MED ORDER — VERAPAMIL HCL 2.5 MG/ML IV SOLN
INTRAVENOUS | Status: AC
  Filled 2019-08-10: qty 2

## 2019-08-10 MED ORDER — CLOPIDOGREL BISULFATE 300 MG PO TABS
ORAL_TABLET | ORAL | Status: AC
  Filled 2019-08-10: qty 2

## 2019-08-10 MED ORDER — HEPARIN (PORCINE) IN NACL 1000-0.9 UT/500ML-% IV SOLN
INTRAVENOUS | Status: DC | PRN
  Administered 2019-08-10 (×2): 500 mL

## 2019-08-10 MED ORDER — SODIUM CHLORIDE 0.9 % IV SOLN
INTRAVENOUS | Status: DC
  Administered 2019-08-10: 06:00:00 via INTRAVENOUS

## 2019-08-10 MED ORDER — BIVALIRUDIN TRIFLUOROACETATE 250 MG IV SOLR
INTRAVENOUS | Status: AC
  Filled 2019-08-10: qty 250

## 2019-08-10 MED ORDER — ASPIRIN 81 MG PO CHEW
81.0000 mg | CHEWABLE_TABLET | Freq: Every day | ORAL | Status: DC
  Administered 2019-08-11 – 2019-08-13 (×3): 81 mg via ORAL
  Filled 2019-08-10 (×3): qty 1

## 2019-08-10 MED ORDER — LABETALOL HCL 5 MG/ML IV SOLN: 10.0000 mg | INTRAVENOUS | Status: AC | PRN

## 2019-08-10 MED ORDER — HEPARIN (PORCINE) IN NACL 1000-0.9 UT/500ML-% IV SOLN
INTRAVENOUS | Status: DC | PRN
  Administered 2019-08-10: 500 mL

## 2019-08-10 MED ORDER — SODIUM CHLORIDE 0.9 % IV SOLN: INTRAVENOUS | Status: DC

## 2019-08-10 MED ORDER — FENTANYL CITRATE (PF) 100 MCG/2ML IJ SOLN
INTRAMUSCULAR | Status: AC
  Filled 2019-08-10: qty 2

## 2019-08-10 MED ORDER — ONDANSETRON HCL 4 MG/2ML IJ SOLN: 4.0000 mg | Freq: Four times a day (QID) | INTRAMUSCULAR | Status: DC | PRN

## 2019-08-10 MED ORDER — MIDAZOLAM HCL 2 MG/2ML IJ SOLN
INTRAMUSCULAR | Status: DC | PRN
  Administered 2019-08-10: 1 mg via INTRAVENOUS

## 2019-08-10 MED ORDER — HEPARIN (PORCINE) IN NACL 1000-0.9 UT/500ML-% IV SOLN
INTRAVENOUS | Status: AC
  Filled 2019-08-10: qty 500

## 2019-08-10 SURGICAL SUPPLY — 22 items
BALLN SAPPHIRE 2.0X12 (BALLOONS) ×2
BALLN ~~LOC~~ EMERGE MR 2.5X12 (BALLOONS) ×2
BALLOON SAPPHIRE 2.0X12 (BALLOONS) IMPLANT
BALLOON ~~LOC~~ EMERGE MR 2.5X12 (BALLOONS) IMPLANT
CATH BALLN WEDGE 5F 110CM (CATHETERS) ×1 IMPLANT
CATH DXT MULTI JL4 JR4 ANG PIG (CATHETERS) ×1 IMPLANT
CATH LAUNCHER 6FR JR4 (CATHETERS) ×1 IMPLANT
CATH VISTA GUIDE 6FR JR4 SH (CATHETERS) ×1 IMPLANT
DEVICE RAD COMP TR BAND LRG (VASCULAR PRODUCTS) ×1 IMPLANT
ELECT DEFIB PAD ADLT CADENCE (PAD) ×1 IMPLANT
GLIDESHEATH SLEND SS 6F .021 (SHEATH) ×1 IMPLANT
GUIDEWIRE INQWIRE 1.5J.035X260 (WIRE) IMPLANT
INQWIRE 1.5J .035X260CM (WIRE) ×2
KIT ENCORE 26 ADVANTAGE (KITS) ×1 IMPLANT
KIT HEART LEFT (KITS) ×1 IMPLANT
PACK CARDIAC CATHETERIZATION (CUSTOM PROCEDURE TRAY) ×2 IMPLANT
SHEATH GLIDE SLENDER 4/5FR (SHEATH) ×1 IMPLANT
STENT SYNERGY DES 2.25X12 (Permanent Stent) ×1 IMPLANT
STENT SYNERGY DES 2.25X16 (Permanent Stent) ×1 IMPLANT
TRANSDUCER W/STOPCOCK (MISCELLANEOUS) ×2 IMPLANT
TUBING CIL FLEX 10 FLL-RA (TUBING) ×1 IMPLANT
WIRE ASAHI PROWATER 180CM (WIRE) ×1 IMPLANT

## 2019-08-10 NOTE — Progress Notes (Signed)
Called to Cath Lab 9 for BiPAP and neb. When I arrived patient was on NRB, very SOB. SAT low 90's. BiPAP started immediately and neb given. Patient began to appear less SOB, SAT improved. Transported to holding area on BiPAP. Currently on 16/8 and 50%. RT will continue to monitor.

## 2019-08-10 NOTE — Progress Notes (Signed)
RT NOTE: RT took pt off BiPAP and placed pt on 4L nasal cannula. Pt able to speak well and take good deep breaths while off of BiPAP. Pt said he feels much better now. RN made aware of pt coming off BiPAP. Will continue to monitor pt status.

## 2019-08-10 NOTE — Progress Notes (Signed)
Patient ID: [name], male   DOB: June 18, 1956, 63 y.o.   MRN: [id number]     Advanced Heart Failure Rounding Note  PCP-Cardiologist: Tobias AlexanderKatarina Nelson, MD   Subjective:    Patient says he diuresed well yesterday though think that I/Os incomplete.  Still with shortness of breath.   RHC/LHC 10/8 Coronary Findings  Diagnostic Dominance: Right Left Main  Not injected, known to be totally occluded.  Left Anterior Descending  Patent LIMA-LAD, backfills to moderate D1. There is a 70% far distal LAD stenosis, seen on prior cath.  Left Circumflex  Sequential SVG-OM and ramus known to be occluded. There are some collaterals to the LCx territory from the RCA.  Right Coronary Artery  The SVG-RCA territory is known to be occluded. The RCA has diffuse disease in the proximal-mid vessel. The mid RCA is stenosed focally to 80-90%.  Intervention  No interventions have been documented. Right Heart  Right Heart Pressures RHC Procedural Findings: Hemodynamics (mmHg) RA mean 6 RV 49/12 PA 49/11, mean 30 PCWP mean 23 with v waves to 33 LV 80/18 AO 82/46  Oxygen saturations: PA 57% AO 98%  Cardiac Output (Fick) 4.74  Cardiac Index (Fick) 2.46 PVR 1.5 WU      Objective:   Weight Range: 77.6 kg Body mass index is 22.57 kg/m.   Vital Signs:   Temp:  [97.7 F (36.5 C)-98.4 F (36.9 C)] 97.7 F (36.5 C) (10/08 0428) Pulse Rate:  [60-70] 63 (10/08 0545) Resp:  [19-28] 21 (10/08 0545) BP: (93-109)/(50-69) 99/57 (10/08 0545) SpO2:  [96 %-100 %] 99 % (10/08 0755) Weight:  [77.6 kg] 77.6 kg (10/08 0453) Last BM Date: 08/08/19(per pt)  Weight change: Filed Weights   08/07/19 0402 08/10/19 0453  Weight: 70.3 kg 77.6 kg    Intake/Output:   Intake/Output Summary (Last 24 hours) at 08/10/2019 0928 Last data filed at 08/10/2019 0300 Gross per 24 hour  Intake --  Output 1350 ml  Net -1350 ml      Physical Exam    General:  Well appearing. No resp difficulty HEENT:  Normal Neck: Supple. JVP 8-9 cm. Carotids 2+ bilat; no bruits. No lymphadenopathy or thyromegaly appreciated. Cor: PMI nondisplaced. Regular rate & rhythm. No rubs, gallops or murmurs. Lungs: Bilateral dry crackles at bases.  Abdomen: Soft, nontender, nondistended. No hepatosplenomegaly. No bruits or masses. Good bowel sounds. Extremities: No cyanosis, clubbing, rash, edema Neuro: Alert & orientedx3, cranial nerves grossly intact. moves all 4 extremities w/o difficulty. Affect pleasant   Telemetry   NSR 65 (personally reviewed)  Labs    CBC Recent Labs    08/08/19 0406 08/09/19 0407  WBC 5.5 3.4*  NEUTROABS 4.3  --   HGB 10.7* 9.7*  HCT 33.9* 32.5*  MCV 82.3 86.2  PLT 195 174   Basic Metabolic Panel Recent Labs    11/91/4709/04/21 0406 08/10/19 0401  NA 138 141  K 4.0 3.9  CL 106 104  CO2 24 28  GLUCOSE 172* 128*  BUN 20 23  CREATININE 0.94 0.93  CALCIUM 8.3* 8.1*   Liver Function Tests No results for input(s): AST, ALT, ALKPHOS, BILITOT, PROT, ALBUMIN in the last 72 hours. No results for input(s): LIPASE, AMYLASE in the last 72 hours. Cardiac Enzymes No results for input(s): CKTOTAL, CKMB, CKMBINDEX, TROPONINI in the last 72 hours.  BNP: BNP (last 3 results) Recent Labs    08/07/19 0405  BNP 304.3*    ProBNP (last 3 results) No results for input(s): PROBNP in  the last 8760 hours.   D-Dimer No results for input(s): DDIMER in the last 72 hours. Hemoglobin A1C No results for input(s): HGBA1C in the last 72 hours. Fasting Lipid Panel No results for input(s): CHOL, HDL, LDLCALC, TRIG, CHOLHDL, LDLDIRECT in the last 72 hours. Thyroid Function Tests No results for input(s): TSH, T4TOTAL, T3FREE, THYROIDAB in the last 72 hours.  Invalid input(s): FREET3  Other results:   Imaging     No results found.   Medications:     Scheduled Medications:  [MAR Hold] aspirin EC  81 mg Oral Daily   [MAR Hold] atorvastatin  80 mg Oral q1800   [MAR Hold]  buPROPion  100 mg Oral BID   [MAR Hold] carvedilol  3.125 mg Oral BID WC   [MAR Hold] Chlorhexidine Gluconate Cloth  6 each Topical Daily   [MAR Hold] enoxaparin (LOVENOX) injection  40 mg Subcutaneous Q24H   [MAR Hold] furosemide  40 mg Intravenous BID   [MAR Hold] gabapentin  300 mg Oral Q8H   [MAR Hold] HYDROcodone-acetaminophen  1 tablet Oral TID   [MAR Hold] hydroxychloroquine  200 mg Oral BID   [MAR Hold] ipratropium-albuterol  3 mL Nebulization BID   [MAR Hold] mirtazapine  7.5 mg Oral QHS   [MAR Hold] multivitamin with minerals  1 tablet Oral Daily   [MAR Hold] nicotine  7 mg Transdermal Daily   [MAR Hold] nutrition supplement (JUVEN)  1 packet Oral BID BM   [MAR Hold] polyethylene glycol  17 g Oral Daily   [MAR Hold] predniSONE  50 mg Oral Q breakfast   [MAR Hold] saccharomyces boulardii  250 mg Oral BID   [MAR Hold] sodium chloride flush  3 mL Intravenous Q12H   [MAR Hold] tamsulosin  0.4 mg Oral QPC supper     Infusions:  sodium chloride 10 mL/hr at 08/10/19 0607   sodium chloride 10 mL/hr (08/10/19 0745)   sodium chloride 10 mL/hr (08/10/19 0840)   bivalirudin (ANGIOMAX) infusion 5 mg/mL 1.75 mg/kg/hr (08/10/19 0928)   famotidine Stopped (08/10/19 0916)     PRN Medications:  sodium chloride, sodium chloride, [MAR Hold] albuterol, bivalirudin (ANGIOMAX) infusion 5 mg/mL, bivalirudin, clopidogrel, famotidine, fentaNYL, [MAR Hold] guaiFENesin, Heparin (Porcine) in NaCl, heparin, heparin, lidocaine (PF), [MAR Hold] magnesium hydroxide, midazolam, Radial Cocktail/Verapamil only    Assessment/Plan   1. Acute hypoxemic respiratory failure: Patient was admitted with dyspnea, hypoxemia, and chest pain.  I do not think that he has an infectious PNA, no fever and normal PCT.  He is now off antibiotics.  No PE.  High resolution CT chest showed evidence for some worsening of ILD compared to 2018 CT, also with superimposed pulmonary edema versus acute  inflammation. PCWP elevated on RHC today but RA pressure 6.  I think that the main cause of his dyspnea is likely pulmonary.  - Agree with steroid burst for possible ILD flare.  - Would give 1 more day of Lasix 40 mg IV bid.  2. Acute on chronic systolic CHF: Ischemic cardiomyopathy. Echo this admission with EF 25-30%, somewhat lower than prior.  He diuresed well yesterday per his report, PCWP mildly elevated and RA normal on RHC.  Cardiac output preserved on today's cath, CI 2.46.  - Continue Lasix 40 mg IV bid x 1 more day.    - With soft BP, decreased Coreg to 3.125 mg bid.  - Ideally, would start spironolactone and ACEI/ARB/ARNI, but BP still low.  - Strict I/Os and daily weights.  3. CAD:  s/p CABG x 3.  Last cath as above in 2018, only the RCA and LIMA-LAD remain patent. He had significant chest tightness on and off for 2 days prior to admission though he did not have a significant troponin elevation, concern for unstable angina.   Coronary angiography today showed a tight RCA stenosis (ongoing patency of LIMA-LAD).  - PCI to RCA today with Dr. Allyson Sabal. Plavix loaded.   - Continue ASA 81 and atorvastatin 80 daily.  4. ILD: High resolution CT with some progression of ILD as above.  - Treated with steroid taper.  5. RA: On Plaquenil. 6. H/o CVA 7. H/o AAA: 4.6 cm in 10/19, will need repeat evaluation soon.  8. Carotid stenosis: s/p right CEA.  9. Prominent murmur in mitral area, but only mild-moderate MR reported on echo.  I reviewed echo today, MR does not appear to be more than moderate.    Length of Stay: 3  Marca Ancona, MD  08/10/2019, 9:28 AM  Advanced Heart Failure Team Pager [phone] (M-F; 7a - 4p)  Please contact [facility] for night-coverage after hours (4p -7a ) and weekends on [email or web address]

## 2019-08-10 NOTE — Interval H&P Note (Signed)
Cath Lab Visit (complete for each Cath Lab visit)  Clinical Evaluation Leading to the Procedure:   ACS: Yes.    Non-ACS:    Anginal Classification: CCS III  Anti-ischemic medical therapy: Maximal Therapy (2 or more classes of medications)  Non-Invasive Test Results: No non-invasive testing performed  Prior CABG: No previous CABG      History and Physical Interval Note:  08/10/2019 9:07 AM  Diona Fanti  has presented today for surgery, with the diagnosis of heart failure.  The various methods of treatment have been discussed with the patient and family. After consideration of risks, benefits and other options for treatment, the patient has consented to  Procedure(s): RIGHT/LEFT HEART CATH AND CORONARY/GRAFT ANGIOGRAPHY (N/A) CORONARY STENT INTERVENTION (N/A) as a surgical intervention.  The patient's history has been reviewed, patient examined, no change in status, stable for surgery.  I have reviewed the patient's chart and labs.  Questions were answered to the patient's satisfaction.     Quay Burow

## 2019-08-10 NOTE — Progress Notes (Signed)
Patient transported to [facility] on BiPAP. No issues.

## 2019-08-10 NOTE — Progress Notes (Addendum)
PROGRESS NOTE    [name]  ZOX:[id number] DOB: 1956-09-10 DOA: 08/07/2019 PCP: Loletta SpecterGomez, Roger David, PA-C   Brief Narrative: Patient is a 63 year old male with history of CVA, rheumatoid arthritis, coronary artery disease, interstitial lung disease, COPD, combined congestive heart failure, abdominal aortic aneurysm who presented with shortness of breath.  He follows with Dr. Marchelle Gearingamaswamy as an outpatient.  He follows with rheumatology for his rheumatoid arthritis.  He presented with shortness of breath.  He is on 2 L of oxygen at night for OSA.  On presentation ,he required 5 to 6 L of oxygen per minute.  Chest x-ray was concerning for possible infiltrates.  COVID-19 was negative .currently being managed for interstitial lung disease secondary to rheumatoid arthritis.  PCCM following.  Echocardiogram showed decreased ejection fraction from last echo with current ejection fraction of 20 to 25%.  Cardiology also consulted and he underwent right and left heart catheterization today with finding of RCA stenosis.  PCI done with stent.  Assessment & Plan:   Principal Problem:   Acute on chronic respiratory failure with hypoxia (HCC) Active Problems:   Atherosclerotic heart disease of native coronary artery with other forms of angina pectoris (HCC)   ILD (interstitial lung disease) (HCC)   HTN (hypertension)   Current smoker   Chronic combined systolic and diastolic CHF (congestive heart failure) (HCC)   Chronic pain syndrome   Rheumatoid arthritis (HCC)   Acute hypoxic respiratory failure: Secondary to progression of his ILD due to rheumatoid arthritis.  Chest x-ray was initially suspicious for pneumonia.  CT angiogram did not show any pulmonary embolism.  High resolution chest CT showed similar pulmonary fibrosis, bronchiectasis, peribronchovascular groundglass.  Also showed new nodule of the right pulmonary apex measuring 9 mm.  Procalcitonin normal.  Pneumonia suspected to be less likely so  antibiotics discontinued.  Currently on steroids.   PCCM was following. PCCM recommended 3 weeks of steroid taper, Lasix.Patient will follow-up with ILD clinic.    Chronic combined congestive heart failure:Echo done here showed ejection is on 20-25 %. and Left ventricular diastolic Doppler parameters are consistent with pseudonormalization pattern of LV diastolic filling.  He has ischemic cardiomyopathy. Cardiology  consulted and he underwent right and left heart catheterization today with finding of RCA stenosis.  PCI done with stent.  Continue carvedilol.  Currently on IV Lasix.  CAD: Cath showed RCA lesion with 80% stenosis.  Stent placed.  On aspirin ,Plavix.  Rheumatoid arthritis: On Plaquenil.  Follows with rheumatology as an outpatient.  Hypertension: Current blood pressure stable.  Continue current regimen  Tobacco dependence: Counseled for cessation.  Hyperlipidemia: Continue Lipitor  Debility/deconditioning: Patient evaluated by PT/OT and recommended SNF on DC.  Social worker consulted.         DVT prophylaxis: Lovenox Code Status:Full  Family Communication: Sister at the bedside Disposition Plan: SNF after cardiology clearance   Consultants: PCCM  Procedures:None  Antimicrobials:  Anti-infectives (From admission, onward)   Start     Dose/Rate Route Frequency Ordered Stop   08/07/19 1015  hydroxychloroquine (PLAQUENIL) tablet 200 mg     200 mg Oral 2 times daily 08/07/19 0925     08/07/19 0930  cefTRIAXone (ROCEPHIN) 2 g in sodium chloride 0.9 % 100 mL IVPB  Status:  Discontinued     2 g 200 mL/hr over 30 Minutes Intravenous Every 24 hours 08/07/19 0925 08/08/19 1143   08/07/19 0930  azithromycin (ZITHROMAX) 500 mg in sodium chloride 0.9 % 250 mL IVPB  Status:  Discontinued     500 mg 250 mL/hr over 60 Minutes Intravenous Every 24 hours 08/07/19 0925 08/08/19 1143   08/07/19 0645  vancomycin (VANCOCIN) IVPB 1000 mg/200 mL premix     1,000 mg 200 mL/hr over 60  Minutes Intravenous  Once 08/07/19 0637 08/07/19 1021   08/07/19 0645  piperacillin-tazobactam (ZOSYN) IVPB 3.375 g  Status:  Discontinued     3.375 g 12.5 mL/hr over 240 Minutes Intravenous  Once 08/07/19 7510 08/07/19 2585      Subjective: Patient seen and examined bedside .  He was currently on BiPAP.  Slightly hypotensive.  Wants to remove the BiPAP .  Not in respiratory distress.  Denies any chest pain or cough  Objective: Vitals:   08/10/19 1029 08/10/19 1042 08/10/19 1300 08/10/19 1400  BP: 111/83 119/85 103/63 102/69  Pulse: 96 90 86 89  Resp: (!) 47 (!) 40 (!) 26 (!) 28  Temp:      TempSrc:      SpO2: 100%  99% 98%  Weight:      Height:        Intake/Output Summary (Last 24 hours) at 08/10/2019 1419 Last data filed at 08/10/2019 1144 Gross per 24 hour  Intake -  Output 4250 ml  Net -4250 ml   Filed Weights   08/07/19 0402 08/10/19 0453  Weight: 70.3 kg 77.6 kg    Examination:  General exam: Deconditioned, debilitated male, appears older than age  HEENT:PERRL,Oral mucosa moist, Ear/Nose normal on gross exam Respiratory system: Bilateral decreased air entry, coarse crackles Cardiovascular system: S1 & S2 heard, RRR. No JVD, murmurs, rubs, gallops or clicks. No pedal edema. Gastrointestinal system: Abdomen is nondistended, soft and nontender. No organomegaly or masses felt. Normal bowel sounds heard. Central nervous system: Alert and oriented. No focal neurological deficits. Extremities: No edema, no clubbing ,no cyanosis, distal peripheral pulses palpable. Skin: No rashes, lesions or ulcers,no icterus ,no pallor      Data Reviewed: I have personally reviewed following labs and imaging studies  CBC: Recent Labs  Lab 08/07/19 0405 08/08/19 0406 08/09/19 0407 08/10/19 0821 08/10/19 1004  WBC 9.9 5.5 3.4*  --   --   NEUTROABS 8.2* 4.3  --   --   --   HGB 13.3 10.7* 9.7* 10.2* 11.2*  HCT 41.8 33.9* 32.5* 30.0* 33.0*  MCV 82.3 82.3 86.2  --   --   PLT  240 195 174  --   --    Basic Metabolic Panel: Recent Labs  Lab 08/07/19 0405 08/08/19 0406 08/10/19 0401 08/10/19 0821 08/10/19 1004  NA 136 138 141 144 143  K 3.9 4.0 3.9 3.7 3.8  CL 103 106 104  --   --   CO2 19* 24 28  --   --   GLUCOSE 140* 172* 128*  --   --   BUN 10 20 23   --   --   CREATININE 0.93 0.94 0.93  --   --   CALCIUM 8.3* 8.3* 8.1*  --   --    GFR: Estimated Creatinine Clearance: 89.2 mL/min (by C-G formula based on SCr of 0.93 mg/dL). Liver Function Tests: Recent Labs  Lab 08/07/19 0405  AST 19  ALT 15  ALKPHOS 79  BILITOT 1.7*  PROT 7.2  ALBUMIN 2.4*   No results for input(s): LIPASE, AMYLASE in the last 168 hours. No results for input(s): AMMONIA in the last 168 hours. Coagulation Profile: No results for input(s): INR, PROTIME in the  last 168 hours. Cardiac Enzymes: No results for input(s): CKTOTAL, CKMB, CKMBINDEX, TROPONINI in the last 168 hours. BNP (last 3 results) No results for input(s): PROBNP in the last 8760 hours. HbA1C: No results for input(s): HGBA1C in the last 72 hours. CBG: No results for input(s): GLUCAP in the last 168 hours. Lipid Profile: No results for input(s): CHOL, HDL, LDLCALC, TRIG, CHOLHDL, LDLDIRECT in the last 72 hours. Thyroid Function Tests: No results for input(s): TSH, T4TOTAL, FREET4, T3FREE, THYROIDAB in the last 72 hours. Anemia Panel: No results for input(s): VITAMINB12, FOLATE, FERRITIN, TIBC, IRON, RETICCTPCT in the last 72 hours. Sepsis Labs: Recent Labs  Lab 08/07/19 0405 08/07/19 0623 08/08/19 0406 08/08/19 0832 08/08/19 1049 08/09/19 0407  PROCALCITON  --   --  <0.10  --   --  <0.10  LATICACIDVEN 2.7* 2.0*  --  1.4 2.0*  --     Recent Results (from the past 240 hour(s))  SARS Coronavirus 2 [facility](Hospital order, Performed in [facility] lab) Nasopharyngeal Nasopharyngeal Swab     Status: None   Collection Time: 08/07/19  4:15 AM   Specimen: Nasopharyngeal Swab  Result Value Ref Range  Status   SARS Coronavirus 2 NEGATIVE NEGATIVE Final    Comment: (NOTE) If result is NEGATIVE SARS-CoV-2 target nucleic acids are NOT DETECTED. The SARS-CoV-2 RNA is generally detectable in upper and lower  respiratory specimens during the acute phase of infection. The lowest  concentration of SARS-CoV-2 viral copies this assay can detect is 250  copies / mL. A negative result does not preclude SARS-CoV-2 infection  and should not be used as the sole basis for treatment or other  patient management decisions.  A negative result may occur with  improper specimen collection / handling, submission of specimen other  than nasopharyngeal swab, presence of viral mutation(s) within the  areas targeted by this assay, and inadequate number of viral copies  (<250 copies / mL). A negative result must be combined with clinical  observations, patient history, and epidemiological information. If result is POSITIVE SARS-CoV-2 target nucleic acids are DETECTED. The SARS-CoV-2 RNA is generally detectable in upper and lower  respiratory specimens dur ing the acute phase of infection.  Positive  results are indicative of active infection with SARS-CoV-2.  Clinical  correlation with patient history and other diagnostic information is  necessary to determine patient infection status.  Positive results do  not rule out bacterial infection or co-infection with other viruses. If result is PRESUMPTIVE POSTIVE SARS-CoV-2 nucleic acids MAY BE PRESENT.   A presumptive positive result was obtained on the submitted specimen  and confirmed on repeat testing.  While 2019 novel coronavirus  (SARS-CoV-2) nucleic acids may be present in the submitted sample  additional confirmatory testing may be necessary for epidemiological  and / or clinical management purposes  to differentiate between  SARS-CoV-2 and other Sarbecovirus currently known to infect humans.  If clinically indicated additional testing with an alternate  test  methodology [phone] is advised. The SARS-CoV-2 RNA is generally  detectable in upper and lower respiratory sp ecimens during the acute  phase of infection. The expected result is Negative. Fact Sheet for Patients:  BoilerBrush.com.cyhttps://www.fda.gov/media/136312/download Fact Sheet for Healthcare Providers: https://pope.com/https://www.fda.gov/media/136313/download This test is not yet approved or cleared by the [place] FDA and has been authorized for detection and/or diagnosis of SARS-CoV-2 by FDA under an Emergency Use Authorization (EUA).  This EUA will remain in effect (meaning this test can be used) for the duration of  the COVID-19 declaration under Section 564(b)(1) of the Act, 21 U.S.C. section 360bbb-3(b)(1), unless the authorization is terminated or revoked sooner. Performed at [facility], [address], [place], [place] [address]   Respiratory Panel by PCR     Status: None   Collection Time: 08/07/19  6:32 PM   Specimen: Nasopharyngeal Swab; Respiratory  Result Value Ref Range Status   Adenovirus NOT DETECTED NOT DETECTED Final   Coronavirus 229E NOT DETECTED NOT DETECTED Final    Comment: (NOTE) The Coronavirus on the Respiratory Panel, DOES NOT test for the novel  Coronavirus (2019 nCoV)    Coronavirus HKU1 NOT DETECTED NOT DETECTED Final   Coronavirus NL63 NOT DETECTED NOT DETECTED Final   Coronavirus OC43 NOT DETECTED NOT DETECTED Final   Metapneumovirus NOT DETECTED NOT DETECTED Final   Rhinovirus / Enterovirus NOT DETECTED NOT DETECTED Final   Influenza A NOT DETECTED NOT DETECTED Final   Influenza B NOT DETECTED NOT DETECTED Final   Parainfluenza Virus 1 NOT DETECTED NOT DETECTED Final   Parainfluenza Virus 2 NOT DETECTED NOT DETECTED Final   Parainfluenza Virus 3 NOT DETECTED NOT DETECTED Final   Parainfluenza Virus 4 NOT DETECTED NOT DETECTED Final   Respiratory Syncytial Virus NOT DETECTED NOT DETECTED Final   Bordetella pertussis NOT DETECTED NOT DETECTED Final    Chlamydophila pneumoniae NOT DETECTED NOT DETECTED Final   Mycoplasma pneumoniae NOT DETECTED NOT DETECTED Final    Comment: Performed at [facility], [address], [place], [place] [address]         Radiology Studies: Dg Chest Port 1 View  Result Date: 08/09/2019 CLINICAL DATA:  Hypoxia EXAM: PORTABLE CHEST 1 VIEW COMPARISON:  08/07/2019 FINDINGS: Postsurgical changes to the sternum and mediastinum. Stable cardiomegaly. Diffuse coarsened interstitial markings compatible with known chronic interstitial lung disease. No superimposed new airspace consolidation. No large pleural fluid collection. No pneumothorax. IMPRESSION: Extensive chronic interstitial lung changes without a superimposed airspace consolidation. Electronically Signed   By: Duanne Guess M.D.   On: 08/09/2019 10:48        Scheduled Meds: . [START ON 08/11/2019] aspirin  81 mg Oral Daily  . atorvastatin  80 mg Oral q1800  . buPROPion  100 mg Oral BID  . carvedilol  3.125 mg Oral BID WC  . Chlorhexidine Gluconate Cloth  6 each Topical Daily  . [START ON 08/11/2019] clopidogrel  75 mg Oral Q breakfast  . enoxaparin (LOVENOX) injection  40 mg Subcutaneous Q24H  . furosemide  40 mg Intravenous BID  . gabapentin  300 mg Oral Q8H  . HYDROcodone-acetaminophen  1 tablet Oral TID  . hydroxychloroquine  200 mg Oral BID  . ipratropium-albuterol  3 mL Nebulization BID  . mirtazapine  7.5 mg Oral QHS  . multivitamin with minerals  1 tablet Oral Daily  . nicotine  7 mg Transdermal Daily  . nutrition supplement (JUVEN)  1 packet Oral BID BM  . polyethylene glycol  17 g Oral Daily  . predniSONE  50 mg Oral Q breakfast  . saccharomyces boulardii  250 mg Oral BID  . sodium chloride flush  3 mL Intravenous Q12H  . tamsulosin  0.4 mg Oral QPC supper   Continuous Infusions: . sodium chloride 50 mL/hr at 08/10/19 1053  . sodium chloride       LOS: 3 days    Time spent:25 mins. More than 50% of that time was spent  in counseling and/or coordination of care.  Shelly Coss, MD [facility] Pager [phone]  If 7PM-7AM, please contact night-coverage [email or web address] Password [email or web address] 08/10/2019, 2:19 PM

## 2019-08-10 NOTE — Interval H&P Note (Signed)
History and Physical Interval Note:  08/10/2019 8:41 AM  [name]  has presented today for surgery, with the diagnosis of heart failure.  The various methods of treatment have been discussed with the patient and family. After consideration of risks, benefits and other options for treatment, the patient has consented to  Procedure(s): RIGHT/LEFT HEART CATH AND CORONARY/GRAFT ANGIOGRAPHY (N/A) as a surgical intervention.  The patient's history has been reviewed, patient examined, no change in status, stable for surgery.  I have reviewed the patient's chart and labs.  Questions were answered to the patient's satisfaction.     Davielle Lingelbach [employer]

## 2019-08-11 ENCOUNTER — Ambulatory Visit (HOSPITAL_COMMUNITY): Admission: RE | Admit: 2019-08-11 | Payer: Medicaid Other | Source: Ambulatory Visit

## 2019-08-11 ENCOUNTER — Ambulatory Visit: Payer: Medicaid Other | Admitting: Family

## 2019-08-11 ENCOUNTER — Encounter (HOSPITAL_COMMUNITY): Payer: Self-pay | Admitting: Cardiology

## 2019-08-11 DIAGNOSIS — J9601 Acute respiratory failure with hypoxia: Secondary | ICD-10-CM

## 2019-08-11 DIAGNOSIS — M86151 Other acute osteomyelitis, right femur: Secondary | ICD-10-CM

## 2019-08-11 DIAGNOSIS — I25118 Atherosclerotic heart disease of native coronary artery with other forms of angina pectoris: Secondary | ICD-10-CM

## 2019-08-11 LAB — CBC WITH DIFFERENTIAL/PLATELET
Abs Immature Granulocytes: 0.03 10*3/uL (ref 0.00–0.07)
Basophils Absolute: 0 10*3/uL (ref 0.0–0.1)
Basophils Relative: 0 %
Eosinophils Absolute: 0.3 10*3/uL (ref 0.0–0.5)
Eosinophils Relative: 3 %
HCT: 37.1 % — ABNORMAL LOW (ref 39.0–52.0)
Hemoglobin: 11.4 g/dL — ABNORMAL LOW (ref 13.0–17.0)
Immature Granulocytes: 0 %
Lymphocytes Relative: 25 %
Lymphs Abs: 2.1 10*3/uL (ref 0.7–4.0)
MCH: 25.3 pg — ABNORMAL LOW (ref 26.0–34.0)
MCHC: 30.7 g/dL (ref 30.0–36.0)
MCV: 82.4 fL (ref 80.0–100.0)
Monocytes Absolute: 0.6 10*3/uL (ref 0.1–1.0)
Monocytes Relative: 6 %
Neutro Abs: 5.7 10*3/uL (ref 1.7–7.7)
Neutrophils Relative %: 66 %
Platelets: 241 10*3/uL (ref 150–400)
RBC: 4.5 MIL/uL (ref 4.22–5.81)
RDW: 17.1 % — ABNORMAL HIGH (ref 11.5–15.5)
WBC: 8.6 10*3/uL (ref 4.0–10.5)
nRBC: 0 % (ref 0.0–0.2)

## 2019-08-11 LAB — BASIC METABOLIC PANEL
Anion gap: 13 (ref 5–15)
BUN: 19 mg/dL (ref 8–23)
CO2: 29 mmol/L (ref 22–32)
Calcium: 7.8 mg/dL — ABNORMAL LOW (ref 8.9–10.3)
Chloride: 96 mmol/L — ABNORMAL LOW (ref 98–111)
Creatinine, Ser: 0.67 mg/dL (ref 0.61–1.24)
GFR calc Af Amer: >60 mL/min (ref >60)
GFR calc non Af Amer: >60 mL/min (ref >60)
Glucose, Bld: 83 mg/dL (ref 70–99)
Potassium: 3.3 mmol/L — ABNORMAL LOW (ref 3.5–5.1)
Sodium: 138 mmol/L (ref 135–145)

## 2019-08-11 MED ORDER — FUROSEMIDE 40 MG PO TABS
40.0000 mg | ORAL_TABLET | Freq: Every day | ORAL | Status: DC
  Administered 2019-08-11 – 2019-08-13 (×3): 40 mg via ORAL
  Filled 2019-08-11 (×3): qty 1

## 2019-08-11 MED ORDER — SPIRONOLACTONE 12.5 MG HALF TABLET
12.5000 mg | ORAL_TABLET | Freq: Every day | ORAL | Status: DC
  Administered 2019-08-11 – 2019-08-12 (×2): 12.5 mg via ORAL
  Filled 2019-08-11 (×3): qty 1

## 2019-08-11 MED ORDER — FUROSEMIDE 40 MG PO TABS: 40.0000 mg | ORAL_TABLET | Freq: Two times a day (BID) | ORAL | Status: DC

## 2019-08-11 MED ORDER — POTASSIUM CHLORIDE CRYS ER 20 MEQ PO TBCR
40.0000 meq | EXTENDED_RELEASE_TABLET | Freq: Four times a day (QID) | ORAL | Status: AC
  Administered 2019-08-11 (×2): 40 meq via ORAL
  Filled 2019-08-11 (×2): qty 2

## 2019-08-11 NOTE — Progress Notes (Signed)
PROGRESS NOTE    [name]  ZOX:[id number] DOB: 1956-07-01 DOA: 08/07/2019 PCP: Loletta SpecterGomez, Roger David, PA-C    Brief Narrative:  63 year old male with history of CVA, rheumatoid arthritis, coronary artery disease, interstitial lung disease, COPD, combined congestive heart failure, abdominal aortic aneurysm who presented with shortness of breath.  He follows with Dr. Marchelle Gearingamaswamy as an outpatient.  He follows with rheumatology for his rheumatoid arthritis.  He presented with shortness of breath.  He is on 2 L of oxygen at night for OSA.  On presentation ,he required 5 to 6 L of oxygen per minute.  Chest x-ray was concerning for possible infiltrates.  COVID-19 was negative .currently being managed for interstitial lung disease secondary to rheumatoid arthritis.  PCCM following.  Echocardiogram showed decreased ejection fraction from last echo with current ejection fraction of 20 to 25%.  Cardiology also consulted and he underwent right and left heart catheterization today with finding of RCA stenosis.  PCI done with stent.  Assessment & Plan:   Principal Problem:   Acute on chronic respiratory failure with hypoxia (HCC) Active Problems:   Atherosclerotic heart disease of native coronary artery with other forms of angina pectoris (HCC)   ILD (interstitial lung disease) (HCC)   HTN (hypertension)   Current smoker   Chronic combined systolic and diastolic CHF (congestive heart failure) (HCC)   Chronic pain syndrome   Rheumatoid arthritis (HCC)  Acute hypoxic respiratory failure:  -Secondary to progression of his ILD due to rheumatoid arthritis.  Chest x-ray was initially suspicious for pneumonia.  CT angiogram did not show any pulmonary embolism.  High resolution chest CT showed similar pulmonary fibrosis, bronchiectasis, peribronchovascular groundglass.  Also showed new nodule of the right pulmonary apex measuring 9 mm.  Procalcitonin normal.  Pneumonia suspected to be less likely so  antibiotics discontinued.  Currently on steroids.   PCCM was following. -PCCM recommended 3 weeks of steroid taper, Lasix. -Will likely require chronic home O2. Cont to wean O2 as tolerated  Chronic combined congestive heart failure: -Echo done here showed ejection is on 20-25 %. and Left ventricular diastolic -Doppler parameters are consistent with pseudonormalization pattern of LV diastolic filling.   -He has ischemic cardiomyopathy. Cardiology following and pt underwent right and left heart catheterization 10/10 with finding of RCA stenosis.  PCI done with stent.  Continue carvedilol.  Was on IV lasix, transitioned to PO per Cardiology  CAD:  -Cath showed RCA lesion with 80% stenosis with stent placed.   -Continue on aspirin ,Plavix.  Rheumatoid arthritis:  -Continued on Plaquenil.   -Follows with rheumatology as an outpatient  Hypertension:  -Current blood remains stable this AM.   -Continue current regimen  Tobacco dependence:  -Tobacco cessation was done this admit.  Hyperlipidemia:  -Continue Lipitor as tolerated  Debility/deconditioning:  -Patient evaluated by PT/OT and recommended SNF on DC.  Social worker consulted.  DVT prophylaxis: Lovenox subQ Code Status: Full Family Communication: Pt in room, family not at bedside Disposition Plan: SNF, timing uncertain  Consultants:   Cardiology  PCCM  Procedures:     Antimicrobials: Anti-infectives (From admission, onward)   Start     Dose/Rate Route Frequency Ordered Stop   08/07/19 1015  hydroxychloroquine (PLAQUENIL) tablet 200 mg     200 mg Oral 2 times daily 08/07/19 0925     08/07/19 0930  cefTRIAXone (ROCEPHIN) 2 g in sodium chloride 0.9 % 100 mL IVPB  Status:  Discontinued     2 g 200 mL/hr over  30 Minutes Intravenous Every 24 hours 08/07/19 0925 08/08/19 1143   08/07/19 0930  azithromycin (ZITHROMAX) 500 mg in sodium chloride 0.9 % 250 mL IVPB  Status:  Discontinued     500 mg 250 mL/hr over 60  Minutes Intravenous Every 24 hours 08/07/19 0925 08/08/19 1143   08/07/19 0645  vancomycin (VANCOCIN) IVPB 1000 mg/200 mL premix     1,000 mg 200 mL/hr over 60 Minutes Intravenous  Once 08/07/19 0637 08/07/19 1021   08/07/19 0645  piperacillin-tazobactam (ZOSYN) IVPB 3.375 g  Status:  Discontinued     3.375 g 12.5 mL/hr over 240 Minutes Intravenous  Once 08/07/19 0637 08/07/19 0928       Subjective: Reports breathing better today  Objective: Vitals:   08/11/19 1255 08/11/19 1300 08/11/19 1400 08/11/19 1500  BP:  (!) 96/54 99/65 (!) 86/58  Pulse: 76 73 80 76  Resp: (!) 23 (!) 21 (!) 27 (!) 25  Temp:      TempSrc:      SpO2: 98% 95% 92% 95%  Weight:      Height:        Intake/Output Summary (Last 24 hours) at 08/11/2019 1551 Last data filed at 08/11/2019 1350 Gross per 24 hour  Intake 1320 ml  Output 3050 ml  Net -1730 ml   Filed Weights   08/07/19 0402 08/10/19 0453 08/11/19 0500  Weight: 70.3 kg 77.6 kg 73.6 kg    Examination:  General exam: Appears calm and comfortable  Respiratory system: Clear to auscultation. Respiratory effort normal. Cardiovascular system: S1 & S2 heard, RRR. Gastrointestinal system: Abdomen is nondistended, soft and nontender. No organomegaly or masses felt. Normal bowel sounds heard. Central nervous system: Alert and oriented. No focal neurological deficits. Extremities: Symmetric 5 x 5 power. Skin: No rashes, lesions  Psychiatry: Judgement and insight appear normal. Mood & affect appropriate.   Data Reviewed: I have personally reviewed following labs and imaging studies  CBC: Recent Labs  Lab 08/07/19 0405 08/08/19 0406 08/09/19 0407 08/10/19 0821 08/10/19 1004 08/11/19 0211  WBC 9.9 5.5 3.4*  --   --  8.6  NEUTROABS 8.2* 4.3  --   --   --  5.7  HGB 13.3 10.7* 9.7* 10.5*   10.2* 11.2* 11.4*  HCT 41.8 33.9* 32.5* 31.0*   30.0* 33.0* 37.1*  MCV 82.3 82.3 86.2  --   --  82.4  PLT 240 195 174  --   --  241   Basic Metabolic  Panel: Recent Labs  Lab 08/07/19 0405 08/08/19 0406 08/10/19 0401 08/10/19 0821 08/10/19 1004 08/11/19 0211  NA 136 138 141 143   144 143 138  K 3.9 4.0 3.9 3.8   3.7 3.8 3.3*  CL 103 106 104  --   --  96*  CO2 19* 24 28  --   --  29  GLUCOSE 140* 172* 128*  --   --  83  BUN 10 20 23   --   --  19  CREATININE 0.93 0.94 0.93  --   --  0.67  CALCIUM 8.3* 8.3* 8.1*  --   --  7.8*   GFR: Estimated Creatinine Clearance: 98.4 mL/min (by C-G formula based on SCr of 0.67 mg/dL). Liver Function Tests: Recent Labs  Lab 08/07/19 0405  AST 19  ALT 15  ALKPHOS 79  BILITOT 1.7*  PROT 7.2  ALBUMIN 2.4*   No results for input(s): LIPASE, AMYLASE in the last 168 hours. No results for input(s): AMMONIA  in the last 168 hours. Coagulation Profile: No results for input(s): INR, PROTIME in the last 168 hours. Cardiac Enzymes: No results for input(s): CKTOTAL, CKMB, CKMBINDEX, TROPONINI in the last 168 hours. BNP (last 3 results) No results for input(s): PROBNP in the last 8760 hours. HbA1C: No results for input(s): HGBA1C in the last 72 hours. CBG: No results for input(s): GLUCAP in the last 168 hours. Lipid Profile: No results for input(s): CHOL, HDL, LDLCALC, TRIG, CHOLHDL, LDLDIRECT in the last 72 hours. Thyroid Function Tests: No results for input(s): TSH, T4TOTAL, FREET4, T3FREE, THYROIDAB in the last 72 hours. Anemia Panel: No results for input(s): VITAMINB12, FOLATE, FERRITIN, TIBC, IRON, RETICCTPCT in the last 72 hours. Sepsis Labs: Recent Labs  Lab 08/07/19 0405 08/07/19 0623 08/08/19 0406 08/08/19 0832 08/08/19 1049 08/09/19 0407  PROCALCITON  --   --  <0.10  --   --  <0.10  LATICACIDVEN 2.7* 2.0*  --  1.4 2.0*  --     Recent Results (from the past 240 hour(s))  SARS Coronavirus 2 [facility] order, Performed in [facility] hospital lab) Nasopharyngeal Nasopharyngeal Swab     Status: None   Collection Time: 08/07/19  4:15 AM   Specimen: Nasopharyngeal Swab  Result  Value Ref Range Status   SARS Coronavirus 2 NEGATIVE NEGATIVE Final    Comment: (NOTE) If result is NEGATIVE SARS-CoV-2 target nucleic acids are NOT DETECTED. The SARS-CoV-2 RNA is generally detectable in upper and lower  respiratory specimens during the acute phase of infection. The lowest  concentration of SARS-CoV-2 viral copies this assay can detect is 250  copies / mL. A negative result does not preclude SARS-CoV-2 infection  and should not be used as the sole basis for treatment or other  patient management decisions.  A negative result may occur with  improper specimen collection / handling, submission of specimen other  than nasopharyngeal swab, presence of viral mutation(s) within the  areas targeted by this assay, and inadequate number of viral copies  (<250 copies / mL). A negative result must be combined with clinical  observations, patient history, and epidemiological information. If result is POSITIVE SARS-CoV-2 target nucleic acids are DETECTED. The SARS-CoV-2 RNA is generally detectable in upper and lower  respiratory specimens dur ing the acute phase of infection.  Positive  results are indicative of active infection with SARS-CoV-2.  Clinical  correlation with patient history and other diagnostic information is  necessary to determine patient infection status.  Positive results do  not rule out bacterial infection or co-infection with other viruses. If result is PRESUMPTIVE POSTIVE SARS-CoV-2 nucleic acids MAY BE PRESENT.   A presumptive positive result was obtained on the submitted specimen  and confirmed on repeat testing.  While 2019 novel coronavirus  (SARS-CoV-2) nucleic acids may be present in the submitted sample  additional confirmatory testing may be necessary for epidemiological  and / or clinical management purposes  to differentiate between  SARS-CoV-2 and other Sarbecovirus currently known to infect humans.  If clinically indicated additional testing  with an alternate test  methodology [phone]) is advised. The SARS-CoV-2 RNA is generally  detectable in upper and lower respiratory sp ecimens during the acute  phase of infection. The expected result is Negative. Fact Sheet for Patients:  StrictlyIdeas.no Fact Sheet for Healthcare Providers: BankingDealers.co.za This test is not yet approved or cleared by the [place] FDA and has been authorized for detection and/or diagnosis of SARS-CoV-2 by FDA under an Emergency Use Authorization (EUA).  This  EUA will remain in effect (meaning this test can be used) for the duration of the COVID-19 declaration under Section 564(b)(1) of the Act, 21 U.S.C. section 360bbb-3(b)(1), unless the authorization is terminated or revoked sooner. Performed at [facility], [address] [place], [place] [address]   Respiratory Panel by PCR     Status: None   Collection Time: 08/07/19  6:32 PM   Specimen: Nasopharyngeal Swab; Respiratory  Result Value Ref Range Status   Adenovirus NOT DETECTED NOT DETECTED Final   Coronavirus 229E NOT DETECTED NOT DETECTED Final    Comment: (NOTE) The Coronavirus on the Respiratory Panel, DOES NOT test for the novel  Coronavirus (2019 nCoV)    Coronavirus HKU1 NOT DETECTED NOT DETECTED Final   Coronavirus NL63 NOT DETECTED NOT DETECTED Final   Coronavirus OC43 NOT DETECTED NOT DETECTED Final   Metapneumovirus NOT DETECTED NOT DETECTED Final   Rhinovirus / Enterovirus NOT DETECTED NOT DETECTED Final   Influenza A NOT DETECTED NOT DETECTED Final   Influenza B NOT DETECTED NOT DETECTED Final   Parainfluenza Virus 1 NOT DETECTED NOT DETECTED Final   Parainfluenza Virus 2 NOT DETECTED NOT DETECTED Final   Parainfluenza Virus 3 NOT DETECTED NOT DETECTED Final   Parainfluenza Virus 4 NOT DETECTED NOT DETECTED Final   Respiratory Syncytial Virus NOT DETECTED NOT DETECTED Final   Bordetella pertussis NOT DETECTED NOT  DETECTED Final   Chlamydophila pneumoniae NOT DETECTED NOT DETECTED Final   Mycoplasma pneumoniae NOT DETECTED NOT DETECTED Final    Comment: Performed at [facility], [address] [place], [place] [address]  MRSA PCR Screening     Status: None   Collection Time: 08/10/19  2:11 PM   Specimen: Nasal Mucosa; Nasopharyngeal  Result Value Ref Range Status   MRSA by PCR NEGATIVE NEGATIVE Final    Comment:        The GeneXpert MRSA Assay (FDA approved for NASAL specimens only), is one component of a comprehensive MRSA colonization surveillance program. It is not intended to diagnose MRSA infection nor to guide or monitor treatment for MRSA infections. Performed at [facility], [address], [place], [place] [address]      Radiology Studies: No results found.  Scheduled Meds:  aspirin  81 mg Oral Daily   atorvastatin  80 mg Oral q1800   buPROPion  100 mg Oral BID   carvedilol  3.125 mg Oral BID WC   Chlorhexidine Gluconate Cloth  6 each Topical Daily   clopidogrel  75 mg Oral Q breakfast   enoxaparin (LOVENOX) injection  40 mg Subcutaneous Q24H   furosemide  40 mg Oral Daily   gabapentin  300 mg Oral Q8H   HYDROcodone-acetaminophen  1 tablet Oral TID   hydroxychloroquine  200 mg Oral BID   ipratropium-albuterol  3 mL Nebulization BID   mirtazapine  7.5 mg Oral QHS   multivitamin with minerals  1 tablet Oral Daily   nicotine  7 mg Transdermal Daily   nutrition supplement (JUVEN)  1 packet Oral BID BM   polyethylene glycol  17 g Oral Daily   predniSONE  50 mg Oral Q breakfast   saccharomyces boulardii  250 mg Oral BID   sodium chloride flush  3 mL Intravenous Q12H   spironolactone  12.5 mg Oral QHS   tamsulosin  0.4 mg Oral QPC supper   Continuous Infusions:  sodium chloride       LOS: 4 days  Rickey Barbara, MD [facility] Pager On Amion  If 7PM-7AM, please contact night-coverage 08/11/2019, 3:51 PM

## 2019-08-11 NOTE — Progress Notes (Addendum)
Patient ID: [name], male   DOB: Apr 23, 1956, 63 y.o.   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   S/p RCA intervention w/ DES 08/10/19.   Continues to diurese well w/ IV Lasix. -5L out yesterday. No complaints overnight from pt but RN reports drop in O2 sats requiring increase in [place] from 4>>6/L/min. BP remains soft, low 332R systolic. MAPs low 60s-70.   RHC/LHC 10/8 Coronary Findings  Diagnostic Dominance: Right Left Main  Not injected, known to be totally occluded.  Left Anterior Descending  Patent LIMA-LAD, backfills to moderate D1. There is a 70% far distal LAD stenosis, seen on prior cath.  Left Circumflex  Sequential SVG-OM and ramus known to be occluded. There are some collaterals to the LCx territory from the RCA.  Right Coronary Artery  The SVG-RCA territory is known to be occluded. The RCA has diffuse disease in the proximal-mid vessel. The mid RCA is stenosed focally to 80-90%.  Intervention   Prox RCA to Mid RCA lesion is 80% stenosed.  A stent was successfully placed.  Post intervention, there is a 0% residual stenosis.    Right Heart  Right Heart Pressures RHC Procedural Findings: Hemodynamics (mmHg) RA mean 6 RV 49/12 PA 49/11, mean 30 PCWP mean 23 with v waves to 33 LV 80/18 AO 82/46  Oxygen saturations: PA 57% AO 98%  Cardiac Output (Fick) 4.74  Cardiac Index (Fick) 2.46 PVR 1.5 WU    Objective:   Weight Range: 73.6 kg Body mass index is 21.41 kg/m.   Vital Signs:   Temp:  [98.1 F (36.7 C)-98.7 F (37.1 C)] 98.7 F (37.1 C) (10/09 0422) Pulse Rate:  [54-176] 81  (10/09 0600) Resp:  [0-63] 20 (10/09 0600) BP: (87-145)/(49-99) 106/67 (10/09 0600) SpO2:  [79 %-100 %] 93 % (10/09 0600) FiO2 (%):  [100 %] 100 % (10/08 1022) Weight:  [73.6 kg] 73.6 kg (10/09 0500) Last BM Date: 08/09/19  Weight change: Filed Weights   08/07/19 0402 08/10/19 0453 08/11/19 0500  Weight: 70.3 kg 77.6 kg 73.6 kg    Intake/Output:   Intake/Output Summary (Last 24 hours) at 08/11/2019 5188 Last data filed at 08/11/2019 0600 Gross per 24 hour  Intake 960 ml  Output 5000 ml  Net -4040 ml      Physical Exam    General:  Chronically ill appearing middle aged WM in NAD. No resp difficulty HEENT: Normal Neck: Supple. JVP 7 cm. Carotids 2+ bilat; no bruits. No lymphadenopathy or thyromegaly appreciated. Cor: PMI nondisplaced. Regular rate & rhythm. No rubs, gallops.  2/6 HSM apex.  Lungs: bibasilar crackles (ILD), w/ diffuse expiratory wheezing Abdomen: Soft, nontender, nondistended. No hepatosplenomegaly. No bruits or masses. Good bowel sounds. Extremities: No cyanosis, rash, edema, + for clubbing Neuro: Alert & orientedx3, cranial nerves grossly intact. moves all 4 extremities w/o difficulty. Affect pleasant   Telemetry   NSR, HR in the 70s w/ occasional PVCs   Labs    CBC Recent Labs    08/09/19 0407  08/10/19 1004 08/11/19 0211  WBC 3.4*  --   --  8.6  NEUTROABS  --   --   --  5.7  HGB 9.7*   < > 11.2* 11.4*  HCT 32.5*   < > 33.0* 37.1*  MCV 86.2  --   --  82.4  PLT 174  --   --  241   < > = values in this interval not displayed.   Basic Metabolic Panel Recent Labs    91/50/56 0401  08/10/19 1004 08/11/19 0211  NA 141   < > 143 138  K 3.9   < > 3.8 3.3*  CL 104  --   --  96*  CO2 28  --   --  29  GLUCOSE 128*  --   --  83  BUN 23  --   --  19  CREATININE 0.93  --   --  0.67  CALCIUM 8.1*  --   --  7.8*   < > = values in this interval not displayed.   Liver Function Tests No results for input(s): AST, ALT, ALKPHOS, BILITOT, PROT,  ALBUMIN in the last 72 hours. No results for input(s): LIPASE, AMYLASE in the last 72 hours. Cardiac Enzymes No results for input(s): CKTOTAL, CKMB, CKMBINDEX, TROPONINI in the last 72 hours.  BNP: BNP (last 3 results) Recent Labs    08/07/19 0405  BNP 304.3*    ProBNP (last 3 results) No results for input(s): PROBNP in the last 8760 hours.   D-Dimer No results for input(s): DDIMER in the last 72 hours. Hemoglobin A1C No results for input(s): HGBA1C in the last 72 hours. Fasting Lipid Panel No results for input(s): CHOL, HDL, LDLCALC, TRIG, CHOLHDL, LDLDIRECT in the last 72 hours. Thyroid Function Tests No results for input(s): TSH, T4TOTAL, T3FREE, THYROIDAB in the last 72 hours.  Invalid input(s): FREET3  Other results:   Imaging    No results found.   Medications:     Scheduled Medications: . aspirin  81 mg Oral Daily  . atorvastatin  80 mg Oral q1800  . buPROPion  100 mg Oral BID  . carvedilol  3.125 mg Oral BID WC  . Chlorhexidine Gluconate Cloth  6 each Topical Daily  . clopidogrel  75 mg Oral Q breakfast  . enoxaparin (LOVENOX) injection  40 mg Subcutaneous Q24H  . furosemide  40 mg Intravenous BID  . gabapentin  300 mg Oral Q8H  . HYDROcodone-acetaminophen  1 tablet Oral TID  . hydroxychloroquine  200 mg Oral BID  . ipratropium-albuterol  3 mL Nebulization BID  . mirtazapine  7.5 mg Oral QHS  . multivitamin with minerals  1 tablet Oral Daily  . nicotine  7 mg Transdermal Daily  . nutrition supplement (JUVEN)  1 packet Oral BID BM  . polyethylene glycol  17 g Oral Daily  . predniSONE  50 mg Oral Q breakfast  . saccharomyces boulardii  250 mg Oral BID  . sodium chloride flush  3 mL Intravenous Q12H  . tamsulosin  0.4 mg Oral QPC supper    Infusions: . sodium chloride      PRN Medications: sodium chloride, acetaminophen, albuterol, guaiFENesin, magnesium hydroxide, morphine injection, ondansetron (ZOFRAN) IV, sodium chloride flush     Assessment/Plan   1. Acute hypoxemic respiratory failure: Patient was admitted with dyspnea, hypoxemia, and chest pain.  I do not think that he has an infectious PNA, no fever and normal PCT.  He is now off antibiotics.  No PE.  High resolution CT chest showed evidence for some worsening of ILD compared to 2018 CT, also with superimposed pulmonary edema  versus acute inflammation. PCWP elevated on RHC 08/10/19 but RA pressure 6.  I think that the main cause of his dyspnea is likely pulmonary.  - Agree with steroid burst for possible ILD flare.  - appears euvolemic after treatment w/ IV diuretics. Switch to PO today.  2. Acute on chronic systolic CHF: Ischemic cardiomyopathy. Echo this admission with EF 25-30%, somewhat lower than prior.  PCWP mildly elevated and RA normal on RHC.  Cardiac output preserved on cath, CI 2.46.  - good diuresis w/ IV Lasix. -5L out yesterday. Net I/os -2.9 L total. Appears euvolemic today - stop IV Lasix. Transition to PO 40 mg once daily - Continue Coreg to 3.125 mg bid.  - Ideally, would start ACEI/ARB/ARNI, but BP still low.  - Start low dose spiro 12.5 mg tonight. 3. CAD: s/p CABG x 3.  Cath in 2018, only the RCA and LIMA-LAD remained patent. He had significant chest tightness on and off for 2 days prior to admission though he did not have a significant troponin elevation, concern for unstable angina.  Coronary angiography 10/8 showed a tight RCA stenosis (ongoing patency of LIMA-LAD). S/p RCA intervention w/ DES.  - DAPT w/ ASA and Plavix  - Continue atorvastatin 80 daily.  -  Continue  blocker 4. ILD: High resolution CT with some progression of ILD as above.  - Treating with steroids.  5. RA: On Plaquenil. 6. H/o CVA 7. H/o AAA: 4.6 cm in 10/19, will need repeat evaluation soon.  8. Carotid stenosis: s/p right CEA. Continue ASA, Plavix + statin.  9. Prominent murmur in mitral area, but only mild-moderate MR reported on echo. Echo personally reviewed, MR does  not appear to be more than moderate.    Length of Stay: [address], PA-C  08/11/2019, 7:12 AM  Advanced Heart Failure Team Pager [phone] (M-F; 7a - 4p)  Please contact [facility] for night-coverage after hours (4p -7a ) and weekends on [email or web address]  Patient seen with PA, agree with the above note.   Off Bipap, now back on 4L [place].  Denies dyspnea or chest pain currently.  Some dyspnea overnight.  Good diuresis yesterday, net negative 4 L.    General: NAD Neck: No JVD, no thyromegaly or thyroid nodule.  Lungs: Crackles dependently.  CV: Nondisplaced PMI.  Heart regular S1/S2, no S3/S4, 2/6 HSM apex.  No peripheral edema.   Abdomen: Soft, nontender, no hepatosplenomegaly, no distention.  Skin: Intact without lesions or rashes.  Neurologic: Alert and oriented x 3.  Psych: Normal affect. Extremities: No clubbing or cyanosis.  HEENT: Normal.   Good diuresis yesterday, look euvolemic.  SBP around 100, this seems to be his baseline.  - Switch to Lasix 40 mg po daily.  - Add spironolactone 12.5 daily.  - Continue Coreg 3.125 mg bid.   PCI to RCA yesterday, no further chest pain.  - Continue DAPT and statin.   I suspect that his ongoing hypoxemia is due primarily to flare of ILD.  Pulmonary has seen.  - Would continue steroids per primary team.   Marca Ancona 08/11/2019 7:49 AM

## 2019-08-11 NOTE — Progress Notes (Signed)
CARDIAC REHAB PHASE I   Stent education completed with pts sister. Educated on importance of ASA and Plavix. Encouraged ambulation as able with emphasis on safety. Sister given gait belt. Will refer to CRP II [place], pt not able to participate.  [id number] Rufina Falco, RN BSN 08/11/2019 3:27 PM

## 2019-08-11 NOTE — Progress Notes (Signed)
Attempted to call report to 6E RN. Callback number provided.

## 2019-08-11 NOTE — Progress Notes (Signed)
Physical Therapy Treatment Patient Details Name: [name] MRN: [id number] DOB: [birth year] Today's Date: 08/11/2019    History of Present Illness Pt is a 63 y.o. male admitted 08/07/19 with onset of hypoxia and acute respiratory failure; cleared for PE and noted he may have pulm HTN, has pulm nodule. Heart cath showing RCA stenosis, s/p PCI stent. PMH includes CVA, CABG x4, CAD, atherosclerosis, sacral decubitus, osteomyelitis, prev smoker, ILD, cardiomegaly, AAA, resp failure, CHF, RA, COPD.   PT Comments    Pt progressing with mobility. Able to increase ambulation distance with walker, requiring minA to stand. SpO2 down to 82% on 3L O2 [place], returning to 91% with seated rest and deep breathing. Sister present and supportive. Pt and sister declining SNF, plan to return to sister's home with 24/7 support and [facility] services. Pt would benefit from manual w/c for longer distances. D/c recs updated accordingly.   Follow Up Recommendations  Home health PT;Supervision/Assistance - 24 hour(declined SNF)     Equipment Recommendations  Wheelchair (measurements PT);Wheelchair cushion (measurements PT)    Recommendations for Other Services       Precautions / Restrictions Precautions Precautions: Fall Precaution Comments: Watch SpO2 Restrictions Weight Bearing Restrictions: No LUE Weight Bearing: (L arm cath site)    Mobility  Bed Mobility Overal bed mobility: Independent                Transfers Overall transfer level: Needs assistance Equipment used: 4-wheeled walker Transfers: Sit to/from Stand Sit to Stand: Min assist         General transfer comment: Reliant on minA to stand from EOB to [employer] walker  Ambulation/Gait Ambulation/Gait assistance: Min guard Gait Distance (Feet): 180 Feet Assistive device: 4-wheeled walker(EVA walker) Gait Pattern/deviations: Step-through pattern;Decreased stride length;Trunk flexed Gait velocity: Decreased Gait velocity interpretation:  <1.8 ft/sec, indicate of risk for recurrent falls General Gait Details: Slow, fatigued gait with eva walker and min guard; intermittent cues for deep breathing and upright posture. SpO2 down to 83% on 3L O2 [place], returning to 91% with seated rest and pursed lip breathing   Stairs             Wheelchair Mobility    Modified Rankin (Stroke Patients Only)       Balance Overall balance assessment: Needs assistance Sitting-balance support: Feet supported Sitting balance-Leahy Scale: Good     Standing balance support: Bilateral upper extremity supported;During functional activity Standing balance-Leahy Scale: Fair Standing balance comment: Static and dynamic stability improved with UE support                            Cognition Arousal/Alertness: Awake/alert Behavior During Therapy: Flat affect Overall Cognitive Status: Within Functional Limits for tasks assessed                                        Exercises      General Comments General comments (skin integrity, edema, etc.): Sister present and supportive; reports she plans to have pt return to her home with her assist (24/7) and HH therapies; pt and sister receptive to education regarding safe mobility and DME needs for home. Encouraged more frequent mobility with nursing staff      Pertinent Vitals/Pain Pain Assessment: No/denies pain    Home Living  Prior Function            PT Goals (current goals can now be found in the care plan section) Acute Rehab PT Goals Patient Stated Goal: Home with assist from sister PT Goal Formulation: With patient/family Time For Goal Achievement: 08/23/19 Potential to Achieve Goals: Good Progress towards PT goals: Progressing toward goals    Frequency    Min 3X/week      PT Plan Current plan remains appropriate    Co-evaluation              AM-PAC PT "6 Clicks" Mobility   Outcome Measure  Help needed  turning from your back to your side while in a flat bed without using bedrails?: None Help needed moving from lying on your back to sitting on the side of a flat bed without using bedrails?: None Help needed moving to and from a bed to a chair (including a wheelchair)?: A Little Help needed standing up from a chair using your arms (e.g., wheelchair or bedside chair)?: A Little Help needed to walk in hospital room?: A Little Help needed climbing 3-5 steps with a railing? : A Little 6 Click Score: 20    End of Session Equipment Utilized During Treatment: Gait belt;Oxygen Activity Tolerance: Patient tolerated treatment well Patient left: (in transport chair for move to 4E) Nurse Communication: Mobility status PT Visit Diagnosis: Unsteadiness on feet (R26.81);Muscle weakness (generalized) (M62.81);Difficulty in walking, not elsewhere classified (R26.2)     Time: 8115-7262 PT Time Calculation (min) (ACUTE ONLY): 25 min  Charges:  $Gait Training: 8-22 mins $Therapeutic Activity: 8-22 mins                    [name], PT, DPT Acute Rehabilitation Services  Pager [phone] Office [phone]  [name] 08/11/2019, 4:42 PM

## 2019-08-11 NOTE — [place] FL2 (Signed)
[place] MEDICAID FL2 LEVEL OF CARE SCREENING TOOL     IDENTIFICATION  Patient Name: [name] Birthdate: 1956-06-13 Sex: male Admission Date (Current Location): 08/07/2019  [facility] [place]   Facility and Address:  [facility], [address], [place], [place] [address]      Provider Number: [id number]  Attending Physician Name and Address:  Jerald Kiefhiu, Stephen K, MD  Relative Name and Phone Number:       Current Level of Care: Hospital Recommended Level of Care: Skilled Nursing Facility Prior Approval Number:    Date Approved/Denied:   PASRR Number: [id number]  Discharge Plan: SNF    Current Diagnoses: Patient Active Problem List   Diagnosis Date Noted  . Acute osteomyelitis of pelvic region and thigh, right (HCC) 08/04/2018  . Pressure injury of skin 06/06/2018  . Complicated UTI (urinary tract infection)   . Cholecystitis 06/04/2018  . Severe sepsis (HCC)   . NSVT (nonsustained ventricular tachycardia) (HCC) 05/14/2018  . Adrenal insufficiency (HCC): Relative 05/11/2018  . Abscess of buttock 05/11/2018  . AKI (acute kidney injury) (HCC)   . Urinary retention   . Rheumatoid arthritis (HCC)   . Chronic obstructive pulmonary disease (HCC)   . Supplemental oxygen dependent   . Tobacco abuse   . AAA (abdominal aortic aneurysm) without rupture (HCC)   . History of CVA with residual deficit   . Acute blood loss anemia   . Hyperkalemia   . Cerebral thrombosis with cerebral infarction 04/11/2018  . Sacral ulcer, with unspecified severity (HCC) 04/05/2018  . Pressure injury of skin of sacral region   . Severe comorbid illness   . Acute renal failure (HCC)   . Shock (HCC) 04/04/2018  . High anion gap metabolic acidosis 04/04/2018  . Acute kidney injury (HCC) 04/04/2018  . Cervicalgia 01/18/2018  . Chronic pain of both shoulders 01/18/2018  . Hx of rheumatoid arthritis 01/18/2018  . Chronic pain syndrome  01/18/2018  . Leukocytosis 12/15/2017  . Cough   . Pneumonia 11/01/2017  . Elevated lipase   . Elevated liver enzymes   . Acute pancreatitis 08/08/2017  . Carotid artery disease (HCC) 05/12/2017  . 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 Chronic combined systolic and diastolic CHF (congestive heart failure) (HCC) 03/07/2017  . S/P CABG (coronary artery bypass graft)   . Pure hypercholesterolemia   . Acute combined systolic and diastolic heart failure (HCC)   . Exertional shortness of breath   . Solitary pulmonary nodule   . Atherosclerotic heart disease of native coronary artery with other forms of angina pectoris (HCC) 02/24/2017  . ILD (interstitial lung disease) (HCC) 02/24/2017  . Anemia 02/24/2017  . AAA (abdominal aortic aneurysm) (HCC) 02/24/2017  . Abnormal EKG 02/24/2017  . Acute on chronic respiratory failure with hypoxia (HCC) 02/24/2017  . History of stroke 02/24/2017  . HTN (hypertension) 02/24/2017  . Current smoker 02/24/2017    Orientation RESPIRATION BLADDER Height & Weight     Self, Time, Situation, Place  O2(Nasal Cannula 6L) Continent Weight: 162 lb 4.1 oz (73.6 kg) Height:  6\' 1"  (185.4 cm)  BEHAVIORAL SYMPTOMS/MOOD NEUROLOGICAL BOWEL NUTRITION STATUS      Continent Diet(regular diet)  AMBULATORY STATUS COMMUNICATION OF NEEDS Skin   Limited Assist Verbally PU Stage and Appropriate Care     PU Stage 3 Dressing: (located on buttocks, Silver hydrofiber;Foam dressing)  Personal Care Assistance Level of Assistance  Bathing, Dressing, Feeding Bathing Assistance: Limited assistance Feeding assistance: Independent Dressing Assistance: Limited assistance     Functional Limitations Info  Sight, Hearing, Speech Sight Info: Adequate Hearing Info: Adequate Speech Info: Adequate    SPECIAL CARE FACTORS FREQUENCY  PT (By licensed PT), OT (By licensed OT)     PT Frequency: 5x OT Frequency: 5x             Contractures Contractures Info: Not present    Additional Factors Info  Code Status, Allergies Code Status Info: Full Code Allergies Info: NO known allergies           Current Medications (08/11/2019):  This is the current hospital active medication list Current Facility-Administered Medications  Medication Dose Route Frequency Provider Last Rate Last Dose  . 0.9 %  sodium chloride infusion  250 mL Intravenous PRN Runell Gess, MD      . acetaminophen (TYLENOL) tablet 650 mg  650 mg Oral Q4H PRN Runell Gess, MD      . albuterol (PROVENTIL) (2.5 MG/3ML) 0.083% nebulizer solution 2.5 mg  2.5 mg Nebulization Q2H PRN Runell Gess, MD   2.5 mg at 08/10/19 1005  . aspirin chewable tablet 81 mg  81 mg Oral Daily Runell Gess, MD   81 mg at 08/11/19 6294  . atorvastatin (LIPITOR) tablet 80 mg  80 mg Oral q1800 Runell Gess, MD   80 mg at 08/10/19 1640  . buPROPion (WELLBUTRIN SR) 12 hr tablet 100 mg  100 mg Oral BID Runell Gess, MD   100 mg at 08/11/19 0940  . carvedilol (COREG) tablet 3.125 mg  3.125 mg Oral BID WC Runell Gess, MD   3.125 mg at 08/11/19 0941  . Chlorhexidine Gluconate Cloth 2 % PADS 6 each  6 each Topical Daily Runell Gess, MD   6 each at 08/09/19 [phone]  . clopidogrel (PLAVIX) tablet 75 mg  75 mg Oral Q breakfast Runell Gess, MD   75 mg at 08/11/19 0941  . enoxaparin (LOVENOX) injection 40 mg  40 mg Subcutaneous Q24H Runell Gess, MD   40 mg at 08/10/19 1640  . furosemide (LASIX) tablet 40 mg  40 mg Oral Daily Robbie Lis M, PA-C   40 mg at 08/11/19 0941  . gabapentin (NEURONTIN) capsule 300 mg  300 mg Oral Q8H Runell Gess, MD   300 mg at 08/11/19 0615  . guaiFENesin (MUCINEX) 12 hr tablet 600 mg  600 mg Oral BID PRN Runell Gess, MD      . HYDROcodone-acetaminophen [facility]) 10-325 MG per tablet 1 tablet  1 tablet Oral TID Runell Gess, MD   1 tablet at 08/11/19 0942  . hydroxychloroquine  (PLAQUENIL) tablet 200 mg  200 mg Oral BID Runell Gess, MD   200 mg at 08/11/19 0942  . ipratropium-albuterol (DUONEB) 0.5-2.5 (3) MG/3ML nebulizer solution 3 mL  3 mL Nebulization BID Runell Gess, MD   3 mL at 08/11/19 0850  . magnesium hydroxide (MILK OF MAGNESIA) suspension 30 mL  30 mL Oral Daily PRN Runell Gess, MD      . mirtazapine (REMERON) tablet 7.5 mg  7.5 mg Oral QHS Runell Gess, MD   7.5 mg at 08/10/19 2147  . morphine 2 MG/ML injection 2 mg  2 mg Intravenous Q1H PRN Runell Gess, MD   2 mg at 08/10/19 1518  . multivitamin with  minerals tablet 1 tablet  1 tablet Oral Daily Lorretta Harp, MD   1 tablet at 08/11/19 [phone]  . nicotine (NICODERM CQ - dosed in mg/24 hr) patch 7 mg  7 mg Transdermal Daily Lorretta Harp, MD   Stopped at 08/11/19 0941  . nutrition supplement (JUVEN) (JUVEN) powder packet 1 packet  1 packet Oral BID BM Lorretta Harp, MD   1 packet at 08/11/19 0940  . ondansetron (ZOFRAN) injection 4 mg  4 mg Intravenous Q6H PRN Lorretta Harp, MD      . polyethylene glycol (MIRALAX / GLYCOLAX) packet 17 g  17 g Oral Daily Lorretta Harp, MD   17 g at 08/11/19 0940  . potassium chloride SA (KLOR-CON) CR tablet 40 mEq  40 mEq Oral Q6H Lyda Jester M, PA-C   40 mEq at 08/11/19 0943  . predniSONE (DELTASONE) tablet 50 mg  50 mg Oral Q breakfast Lorretta Harp, MD   50 mg at 08/11/19 0940  . saccharomyces boulardii (FLORASTOR) capsule 250 mg  250 mg Oral BID Lorretta Harp, MD   250 mg at 08/11/19 0942  . sodium chloride flush (NS) 0.9 % injection 3 mL  3 mL Intravenous Q12H Lorretta Harp, MD   3 mL at 08/11/19 0943  . sodium chloride flush (NS) 0.9 % injection 3 mL  3 mL Intravenous PRN Lorretta Harp, MD      . spironolactone (ALDACTONE) tablet 12.5 mg  12.5 mg Oral QHS Larey Dresser, MD      . tamsulosin [facility]) capsule 0.4 mg  0.4 mg Oral QPC supper Lorretta Harp, MD   0.4 mg at 08/10/19 1639     Discharge  Medications: Please see discharge summary for a list of discharge medications.  Relevant Imaging Results:  Relevant Lab Results:   Additional Information ELF:[id number]  Eileen Stanford, LCSW

## 2019-08-12 DIAGNOSIS — I714 Abdominal aortic aneurysm, without rupture: Secondary | ICD-10-CM

## 2019-08-12 DIAGNOSIS — Z955 Presence of coronary angioplasty implant and graft: Secondary | ICD-10-CM

## 2019-08-12 DIAGNOSIS — I340 Nonrheumatic mitral (valve) insufficiency: Secondary | ICD-10-CM

## 2019-08-12 DIAGNOSIS — M069 Rheumatoid arthritis, unspecified: Secondary | ICD-10-CM

## 2019-08-12 DIAGNOSIS — I493 Ventricular premature depolarization: Secondary | ICD-10-CM

## 2019-08-12 DIAGNOSIS — Z9889 Other specified postprocedural states: Secondary | ICD-10-CM

## 2019-08-12 DIAGNOSIS — I25708 Atherosclerosis of coronary artery bypass graft(s), unspecified, with other forms of angina pectoris: Secondary | ICD-10-CM

## 2019-08-12 LAB — CBC
HCT: 34.3 % — ABNORMAL LOW (ref 39.0–52.0)
Hemoglobin: 10.8 g/dL — ABNORMAL LOW (ref 13.0–17.0)
MCH: 26 pg (ref 26.0–34.0)
MCHC: 31.5 g/dL (ref 30.0–36.0)
MCV: 82.7 fL (ref 80.0–100.0)
Platelets: 204 10*3/uL (ref 150–400)
RBC: 4.15 MIL/uL — ABNORMAL LOW (ref 4.22–5.81)
RDW: 16.7 % — ABNORMAL HIGH (ref 11.5–15.5)
WBC: 8 10*3/uL (ref 4.0–10.5)
nRBC: 0 % (ref 0.0–0.2)

## 2019-08-12 LAB — COMPREHENSIVE METABOLIC PANEL
ALT: 16 U/L (ref 0–44)
AST: 12 U/L — ABNORMAL LOW (ref 15–41)
Albumin: 2.1 g/dL — ABNORMAL LOW (ref 3.5–5.0)
Alkaline Phosphatase: 60 U/L (ref 38–126)
Anion gap: 12 (ref 5–15)
BUN: 19 mg/dL (ref 8–23)
CO2: 28 mmol/L (ref 22–32)
Calcium: 8.3 mg/dL — ABNORMAL LOW (ref 8.9–10.3)
Chloride: 99 mmol/L (ref 98–111)
Creatinine, Ser: 0.79 mg/dL (ref 0.61–1.24)
GFR calc Af Amer: >60 mL/min (ref >60)
GFR calc non Af Amer: >60 mL/min (ref >60)
Glucose, Bld: 115 mg/dL — ABNORMAL HIGH (ref 70–99)
Potassium: 3.5 mmol/L (ref 3.5–5.1)
Sodium: 139 mmol/L (ref 135–145)
Total Bilirubin: 0.9 mg/dL (ref 0.3–1.2)
Total Protein: 6 g/dL — ABNORMAL LOW (ref 6.5–8.1)

## 2019-08-12 LAB — MAGNESIUM: Magnesium: 2.1 mg/dL (ref 1.7–2.4)

## 2019-08-12 MED ORDER — POTASSIUM CHLORIDE CRYS ER 20 MEQ PO TBCR
40.0000 meq | EXTENDED_RELEASE_TABLET | Freq: Four times a day (QID) | ORAL | Status: AC
  Administered 2019-08-12 (×2): 40 meq via ORAL
  Filled 2019-08-12 (×2): qty 2

## 2019-08-12 NOTE — Progress Notes (Signed)
Physical Therapy Treatment Patient Details Name: [name] MRN: [id number] DOB: 12/12/1955 Today's Date: 08/12/2019    History of Present Illness Pt is a 63 y.o. male admitted 08/07/19 with onset of hypoxia and acute respiratory failure; cleared for PE and noted he may have pulm HTN, has pulm nodule. Heart cath showing RCA stenosis, s/p PCI stent. PMH includes CVA, CABG x4, CAD, atherosclerosis, sacral decubitus, osteomyelitis, prev smoker, ILD, cardiomegaly, AAA, resp failure, CHF, RA, COPD.    PT Comments    Patient received in bed, agrees to PT session. RN  bumped up O2 to 4 lpm due to desaturation. Patient independent with bed mobility with increased effort. Requires min assist to power up from bed to standing. Ambulated with RW and min guard 150 feet. Several standing rest breaks needed due to desaturation. Down to 81% at times. Recovered to >90% with pursed lip breathing and rest. Patient will benefit from continued skilled PT to improve activity tolerance and strength.       Follow Up Recommendations  Home health PT;Supervision/Assistance - 24 hour     Equipment Recommendations  Wheelchair (measurements PT);Wheelchair cushion (measurements PT)    Recommendations for Other Services       Precautions / Restrictions Precautions Precautions: Fall Precaution Comments: Watch SpO2 Restrictions Weight Bearing Restrictions: No    Mobility  Bed Mobility Overal bed mobility: Independent                Transfers Overall transfer level: Needs assistance Equipment used: Rolling walker (2 wheeled) Transfers: Sit to/from Stand Sit to Stand: Min assist            Ambulation/Gait Ambulation/Gait assistance: Min guard Gait Distance (Feet): 150 Feet Assistive device: Rolling walker (2 wheeled) Gait Pattern/deviations: Step-through pattern Gait velocity: Decreased   General Gait Details: Intermittent rest breaks due to desaturation into low 80%s on 4 lpm O2.  Returns to > 90% with standing rest and pursed lip breathing.   Stairs             Wheelchair Mobility    Modified Rankin (Stroke Patients Only)       Balance Overall balance assessment: Needs assistance Sitting-balance support: Feet supported Sitting balance-Leahy Scale: Good     Standing balance support: Bilateral upper extremity supported Standing balance-Leahy Scale: Fair                              Cognition Arousal/Alertness: Awake/alert Behavior During Therapy: Flat affect Overall Cognitive Status: Within Functional Limits for tasks assessed                                        Exercises      General Comments        Pertinent Vitals/Pain Pain Assessment: No/denies pain    Home Living                      Prior Function            PT Goals (current goals can now be found in the care plan section) Acute Rehab PT Goals Patient Stated Goal: Home with assist from sister PT Goal Formulation: With patient Time For Goal Achievement: 08/23/19 Potential to Achieve Goals: Good Progress towards PT goals: Progressing toward goals    Frequency    Min 3X/week  PT Plan Current plan remains appropriate    Co-evaluation              AM-PAC PT "6 Clicks" Mobility   Outcome Measure  Help needed turning from your back to your side while in a flat bed without using bedrails?: None Help needed moving from lying on your back to sitting on the side of a flat bed without using bedrails?: None Help needed moving to and from a bed to a chair (including a wheelchair)?: A Little Help needed standing up from a chair using your arms (e.g., wheelchair or bedside chair)?: A Little Help needed to walk in hospital room?: A Little Help needed climbing 3-5 steps with a railing? : A Little 6 Click Score: 20    End of Session Equipment Utilized During Treatment: Gait belt;Oxygen Activity Tolerance: Other  (comment);Patient limited by fatigue;Patient tolerated treatment well(limited by SOB and desaturation) Patient left: in bed;with bed alarm set;with call bell/phone within reach Nurse Communication: Mobility status PT Visit Diagnosis: Muscle weakness (generalized) (M62.81);Other abnormalities of gait and mobility (R26.89);Difficulty in walking, not elsewhere classified (R26.2)     Time: 7741-2878 PT Time Calculation (min) (ACUTE ONLY): 23 min  Charges:  $Gait Training: 23-37 mins                     Dasia Guerrier, PT, GCS 08/12/19,12:30 PM

## 2019-08-12 NOTE — Progress Notes (Addendum)
Progress Note  Patient Name: [name] Date of Encounter: 08/12/2019  Primary Cardiologist: [name], MD   Subjective   Denies chest pain.  He says his breathing has improved since yesterday.  Inpatient Medications    Scheduled Meds: . aspirin  81 mg Oral Daily  . atorvastatin  80 mg Oral q1800  . buPROPion  100 mg Oral BID  . carvedilol  3.125 mg Oral BID WC  . clopidogrel  75 mg Oral Q breakfast  . enoxaparin (LOVENOX) injection  40 mg Subcutaneous Q24H  . furosemide  40 mg Oral Daily  . gabapentin  300 mg Oral Q8H  . HYDROcodone-acetaminophen  1 tablet Oral TID  . hydroxychloroquine  200 mg Oral BID  . ipratropium-albuterol  3 mL Nebulization BID  . mirtazapine  7.5 mg Oral QHS  . multivitamin with minerals  1 tablet Oral Daily  . nicotine  7 mg Transdermal Daily  . nutrition supplement (JUVEN)  1 packet Oral BID BM  . polyethylene glycol  17 g Oral Daily  . predniSONE  50 mg Oral Q breakfast  . saccharomyces boulardii  250 mg Oral BID  . sodium chloride flush  3 mL Intravenous Q12H  . spironolactone  12.5 mg Oral QHS  . tamsulosin  0.4 mg Oral QPC supper   Continuous Infusions: . sodium chloride     PRN Meds: sodium chloride, acetaminophen, albuterol, guaiFENesin, magnesium hydroxide, morphine injection, ondansetron (ZOFRAN) IV, sodium chloride flush   Vital Signs    Vitals:   08/12/19 0840 08/12/19 0842 08/12/19 0900 08/12/19 0903  BP:    104/60  Pulse: 86 81 77 75  Resp: (!) 21 (!) 29 18 19   Temp:      TempSrc:      SpO2: (!) 89% (!) 87% 97% 95%  Weight:      Height:        Intake/Output Summary (Last 24 hours) at 08/12/2019 1149 Last data filed at 08/12/2019 0835 Gross per 24 hour  Intake 240 ml  Output 1200 ml  Net -960 ml   Filed Weights   08/07/19 0402 08/10/19 0453 08/11/19 0500  Weight: 70.3 kg 77.6 kg 73.6 kg    Telemetry    Sinus rhythm with PVCs- Personally Reviewed   Physical Exam   GEN: No acute distress.    Neck: No JVD Cardiac: RRR, 2/6 holosystolic apical murmur, no rubs, or gallops.  Respiratory:  Diffusely diminished breath sounds with bibasilar crackles. GI: Soft, nontender, non-distended  MS: No edema; No deformity. Neuro:  Nonfocal  Psych: Normal affect   Labs    Chemistry Recent Labs  Lab 08/07/19 0405  08/10/19 0401  08/10/19 1004 08/11/19 0211 08/12/19 0403  NA 136   < > 141   < > 143 138 139  K 3.9   < > 3.9   < > 3.8 3.3* 3.5  CL 103   < > 104  --   --  96* 99  CO2 19*   < > 28  --   --  29 28  GLUCOSE 140*   < > 128*  --   --  83 115*  BUN 10   < > 23  --   --  19 19  CREATININE 0.93   < > 0.93  --   --  0.67 0.79  CALCIUM 8.3*   < > 8.1*  --   --  7.8* 8.3*  PROT 7.2  --   --   --   --   --  6.0*  ALBUMIN 2.4*  --   --   --   --   --  2.1*  AST 19  --   --   --   --   --  12*  ALT 15  --   --   --   --   --  16  ALKPHOS 79  --   --   --   --   --  60  BILITOT 1.7*  --   --   --   --   --  0.9  GFRNONAA >60   < > >60  --   --  >60 >60  GFRAA >60   < > >60  --   --  >60 >60  ANIONGAP 14   < > 9  --   --  13 12   < > = values in this interval not displayed.     Hematology Recent Labs  Lab 08/09/19 0407  08/10/19 1004 08/11/19 0211 08/12/19 0403  WBC 3.4*  --   --  8.6 8.0  RBC 3.77*  --   --  4.50 4.15*  HGB 9.7*   < > 11.2* 11.4* 10.8*  HCT 32.5*   < > 33.0* 37.1* 34.3*  MCV 86.2  --   --  82.4 82.7  MCH 25.7*  --   --  25.3* 26.0  MCHC 29.8*  --   --  30.7 31.5  RDW 17.3*  --   --  17.1* 16.7*  PLT 174  --   --  241 204   < > = values in this interval not displayed.    Cardiac EnzymesNo results for input(s): TROPONINI in the last 168 hours. No results for input(s): TROPIPOC in the last 168 hours.   BNP Recent Labs  Lab 08/07/19 0405  BNP 304.3*     DDimer No results for input(s): DDIMER in the last 168 hours.   Radiology    No results found.  Cardiac Studies   Cardiac catheterization 08/10/2019:  1. Normal RA pressure, PCWP still  elevated.  2. Mild pulmonary venous hypertension.  3. Preserved cardiac output.  4. Patent LIMA-LAD with known 70% far distal LAD stenosis.  5. 80-90% mid RCA stenosis.  6. The 2 SVGs and left main known to be occluded.   Patient had several days of chest pain prior to admission though troponin not markedly elevated.  Concerned for unstable angina, will plan PCI to RCA today.     Prox RCA to Mid RCA lesion is 80% stenosed.  A stent was successfully placed.  Post intervention, there is a 0% residual stenosis.   Echocardiogram 08/08/2019:   1. Left ventricular ejection fraction, by visual estimation, is 25 to 30%. The left ventricle has severely decreased function. Moderately increased left ventricular size. There is no left ventricular hypertrophy.  2. Multiple segmental abnormalities exist. See findings.  3. Elevated mean left atrial pressure.  4. Left ventricular diastolic Doppler parameters are consistent with pseudonormalization pattern of LV diastolic filling.  5. Global right ventricle has normal systolic function.The right ventricular size is normal. No increase in right ventricular wall thickness.  6. Left atrial size was severely dilated.  7. Right atrial size was normal.  8. The mitral valve is normal in structure. Mild to moderate mitral valve regurgitation.  9. The tricuspid valve is normal in structure. Tricuspid valve regurgitation is trivial. 10. The aortic valve is tricuspid Aortic valve regurgitation was not visualized by color  flow Doppler. 11. The pulmonic valve was normal in structure. Pulmonic valve regurgitation is trivial by color flow Doppler. 12. Normal pulmonary artery systolic pressure. 13. Compared to June 2019, overall findings are similar, but overall LVEF is slightly worse.  Patient Profile     63 y.o. male with history of RA-related ILD, CAD s/p CABG, CVA, ischemic cardiomyopathy, chronic systolic HF EF 70-62%, prior osteomyelitis related to sacral  decubitus ulcer was admitted with acute hypoxemic respiratory failure, felt to be 2/2 progression of his ILD and acute on chronic systolic HF. S/p RCA intervention w/ DES 08/10/19.   Assessment & Plan    1. Acute hypoxemic respiratory failure: Patient was admitted with dyspnea, hypoxemia, and chest pain. I do not think that he has an infectious PNA, no fever and normal PCT. He is now off antibiotics. No PE. High resolution CT chest showed evidence for some worsening of ILD compared to 2018 CT, also with superimposed pulmonary edema versus acute inflammation. PCWP elevated on RHC 08/10/19 but RA pressure 6.  I think that the main cause of his dyspnea is likely pulmonary.  - Agree with steroid burst for possible ILD flare.  - appears euvolemic after treatment w/ IV diuretics.  Switched to oral Lasix 40 mg daily on 08/11/2019.   2. Acute on chronic systolic CHF: Ischemic cardiomyopathy. Echo this admission with EF 25-30%, somewhat lower than prior.  PCWP mildly elevated and RA normal on RHC.  Cardiac output preserved on cath, CI 2.46.  - good diuresis w/ IV Lasix and switched to oral Lasix 40 mg daily on 08/11/2019.  1.14 L output in last 24+ hours.  Appears euvolemic today - Continue Coreg to 3.125 mg bid.  - Ideally, would start ACEI/ARB/ARNI, but BP still low.  - continue low dose spiro 12.5 mg.  3. CAD: s/p CABG x 3. Cath in 2018, only the RCA and LIMA-LAD remained patent. He had significant chest tightness on and off for 2 days prior to admission though he did not have a significant troponin elevation, concern for unstable angina.  Coronary angiography 10/8 showed a tight RCA stenosis (ongoing patency of LIMA-LAD). S/p RCA intervention w/ DES.  - DAPT w/ ASA and Plavix  - Continue atorvastatin 80 daily.  -  Continue ? blocker  4. ILD: High resolution CT with some progression of ILD as above.  - Treating with steroids.   5. RA: On Plaquenil.  6. H/o CVA  7. H/o AAA: 4.6 cm in 10/19, will  need repeat evaluation soon.   8. Carotid stenosis: s/p right CEA.Continue ASA, Plavix + statin.   9. Prominent murmur in mitral area, but only mild-moderate MR reported on echo. Echo personally reviewed, MR does not appear to be more than moderate.  10.  Frequent PVCs: I will give additional potassium chloride today.  I will check a magnesium level.  For questions or updates, please contact [place] Please consult [email or web address] for contact info under Cardiology/STEMI.      Signed, Kate Sable, MD  08/12/2019, 11:49 AM

## 2019-08-12 NOTE — TOC Progression Note (Signed)
Transition of Care [facility]) - Progression Note    Patient Details  Name: [name] MRN: [id number] Date of Birth: 1956/02/27  Transition of Care [facility]) CM/SW Contact  Zenon Mayo, RN Phone Number: 08/12/2019, 4:28 PM  Clinical Narrative:    NCM spoke with patient, he wants NCM to call his daughter Ladona Horns.  NCM contacted daughter offered choice for HHPT, HHRN, she chose [facility],she states she has already spoken to [place] with [facility] regarding [facility] and HHPT and Sharyn Lull states they can take referral.  This NCM tried to contact [facility] with no answer.  NCM called Dian Situ with [facility] and informed him of this information.  He states he will check with Sharyn Lull on Monday.  Daughter then goes on to say patient will need w/chair, neb machine and oxygen before he comes home. He is a patient of [name].  NCM notified attending that daughter would like to have DME ordered.        Expected Discharge Plan and Services                                                 Social Determinants of Health (SDOH) Interventions    Readmission Risk Interventions No flowsheet data found.

## 2019-08-12 NOTE — Progress Notes (Signed)
Pt on 3L O2 desat to 88% at rest, RN pumped O2 up to 4L. O2 sat go back up to 90s%. Will leave it at 4L and continue to monitor.

## 2019-08-12 NOTE — Progress Notes (Signed)
CSW acknowledges consult and it appears that patient refused SNF and is discharging with [facility]. CSW alerted RN case manager of patient's choice. CSW closing out consult.

## 2019-08-12 NOTE — Progress Notes (Signed)
PROGRESS NOTE    [name]  KGM:[id number] DOB: [birth year] DOA: 08/07/2019 PCP: [name], PA-C    Brief Narrative:  63 year old male with history of CVA, rheumatoid arthritis, coronary artery disease, interstitial lung disease, COPD, combined congestive heart failure, abdominal aortic aneurysm who presented with shortness of breath.  He follows with [name] as an outpatient.  He follows with rheumatology for his rheumatoid arthritis.  He presented with shortness of breath.  He is on 2 L of oxygen at night for OSA.  On presentation ,he required 5 to 6 L of oxygen per minute.  Chest x-ray was concerning for possible infiltrates.  COVID-19 was negative .currently being managed for interstitial lung disease secondary to rheumatoid arthritis.  PCCM following.  Echocardiogram showed decreased ejection fraction from last echo with current ejection fraction of 20 to 25%.  Cardiology also consulted and he underwent right and left heart catheterization today with finding of RCA stenosis.  PCI done with stent.  Assessment & Plan:   Principal Problem:   Acute on chronic respiratory failure with hypoxia (HCC) Active Problems:   Atherosclerotic heart disease of native coronary artery with other forms of angina pectoris (HCC)   ILD (interstitial lung disease) (HCC)   HTN (hypertension)   Current smoker   Chronic combined systolic and diastolic CHF (congestive heart failure) (HCC)   Chronic pain syndrome   Rheumatoid arthritis (HCC)  Acute hypoxic respiratory failure:  -Secondary to progression of his ILD due to rheumatoid arthritis.  Chest x-ray was initially suspicious for pneumonia.  CT angiogram did not show any pulmonary embolism.  High resolution chest CT showed similar pulmonary fibrosis, bronchiectasis, peribronchovascular groundglass.  Also showed new nodule of the right pulmonary apex measuring 9 mm.  Procalcitonin normal.  Pneumonia suspected to be less likely so  antibiotics discontinued.  Currently on steroids.   PCCM was following. -PCCM recommended 3 weeks of steroid taper, Lasix. -Will likely require chronic home O2. Cont to wean O2 as tolerated. Currently on 4LNC  Chronic combined congestive heart failure: -Echo done here showed ejection is on 20-25 %. and Left ventricular diastolic -Doppler parameters are consistent with pseudonormalization pattern of LV diastolic filling.   -He has ischemic cardiomyopathy. Cardiology following and pt underwent right and left heart catheterization 10/10 with finding of RCA stenosis.  PCI done with stent.  Continue carvedilol.  Was on IV lasix, transitioned to PO per Cardiology on 10/9 -Potassium being replaced by Cardiology -Therapy recommendations for SNF, however pt declines and has decided on home health services instead  CAD:  -Cath showed RCA lesion with 80% stenosis with stent placed.   -Continue on aspirin ,Plavix. -Chest pain free at this time  Rheumatoid arthritis:  -Continued on Plaquenil.   -Follows with rheumatology as an outpatient -Seems stable at this time  Hypertension:  -Current blood remains stable this AM.   -Continue current regimen  Tobacco dependence:  -Tobacco cessation was done this admit.  Hyperlipidemia:  -Continue Lipitor as tolerated, stable  Debility/deconditioning:  -Patient evaluated by PT/OT and recommended SNF on DC.   -Pt has declined SNF and instead prefers home health services  DVT prophylaxis: Lovenox subQ Code Status: Full Family Communication: Pt in room, family not at bedside Disposition Plan: SNF, timing uncertain  Consultants:   Cardiology  PCCM  Procedures:     Antimicrobials: Anti-infectives (From admission, onward)   Start     Dose/Rate Route Frequency Ordered Stop   08/07/19 1015  hydroxychloroquine (PLAQUENIL) tablet 200  mg     200 mg Oral 2 times daily 08/07/19 0925     08/07/19 0930  cefTRIAXone (ROCEPHIN) 2 g in sodium  chloride 0.9 % 100 mL IVPB  Status:  Discontinued     2 g 200 mL/hr over 30 Minutes Intravenous Every 24 hours 08/07/19 0925 08/08/19 1143   08/07/19 0930  azithromycin (ZITHROMAX) 500 mg in sodium chloride 0.9 % 250 mL IVPB  Status:  Discontinued     500 mg 250 mL/hr over 60 Minutes Intravenous Every 24 hours 08/07/19 0925 08/08/19 1143   08/07/19 0645  vancomycin (VANCOCIN) IVPB 1000 mg/200 mL premix     1,000 mg 200 mL/hr over 60 Minutes Intravenous  Once 08/07/19 0637 08/07/19 1021   08/07/19 0645  piperacillin-tazobactam (ZOSYN) IVPB 3.375 g  Status:  Discontinued     3.375 g 12.5 mL/hr over 240 Minutes Intravenous  Once 08/07/19 5638 08/07/19 0928      Subjective: Without complaints. Denies feeling sob  Objective: Vitals:   08/12/19 0842 08/12/19 0900 08/12/19 0903 08/12/19 1439  BP:   104/60 112/65  Pulse: 81 77 75 78  Resp: (!) 29 18 19  (!) 26  Temp:      TempSrc:      SpO2: (!) 87% 97% 95% 97%  Weight:      Height:        Intake/Output Summary (Last 24 hours) at 08/12/2019 1448 Last data filed at 08/12/2019 0835 Gross per 24 hour  Intake 240 ml  Output 550 ml  Net -310 ml   Filed Weights   08/07/19 0402 08/10/19 0453 08/11/19 0500  Weight: 70.3 kg 77.6 kg 73.6 kg    Examination: General exam: Awake, laying in bed, in nad Respiratory system: Normal respiratory effort, no wheezing Cardiovascular system: regular rate, s1, s2 Gastrointestinal system: Soft, nondistended, positive BS Central nervous system: CN2-12 grossly intact, strength intact Extremities: Perfused, no clubbing Skin: Normal skin turgor, no notable skin lesions seen Psychiatry: Mood normal // no visual hallucinations   Data Reviewed: I have personally reviewed following labs and imaging studies  CBC: Recent Labs  Lab 08/07/19 0405 08/08/19 0406 08/09/19 0407 08/10/19 0821 08/10/19 1004 08/11/19 0211 08/12/19 0403  WBC 9.9 5.5 3.4*  --   --  8.6 8.0  NEUTROABS 8.2* 4.3  --   --    --  5.7  --   HGB 13.3 10.7* 9.7* 10.5*  10.2* 11.2* 11.4* 10.8*  HCT 41.8 33.9* 32.5* 31.0*  30.0* 33.0* 37.1* 34.3*  MCV 82.3 82.3 86.2  --   --  82.4 82.7  PLT 240 195 174  --   --  241 204   Basic Metabolic Panel: Recent Labs  Lab 08/07/19 0405 08/08/19 0406 08/10/19 0401 08/10/19 0821 08/10/19 1004 08/11/19 0211 08/12/19 0403 08/12/19 1151  NA 136 138 141 143  144 143 138 139  --   K 3.9 4.0 3.9 3.8  3.7 3.8 3.3* 3.5  --   CL 103 106 104  --   --  96* 99  --   CO2 19* 24 28  --   --  29 28  --   GLUCOSE 140* 172* 128*  --   --  83 115*  --   BUN 10 20 23   --   --  19 19  --   CREATININE 0.93 0.94 0.93  --   --  0.67 0.79  --   CALCIUM 8.3* 8.3* 8.1*  --   --  7.8* 8.3*  --   MG  --   --   --   --   --   --   --  2.1   GFR: Estimated Creatinine Clearance: 98.4 mL/min (by C-G formula based on SCr of 0.79 mg/dL). Liver Function Tests: Recent Labs  Lab 08/07/19 0405 08/12/19 0403  AST 19 12*  ALT 15 16  ALKPHOS 79 60  BILITOT 1.7* 0.9  PROT 7.2 6.0*  ALBUMIN 2.4* 2.1*   No results for input(s): LIPASE, AMYLASE in the last 168 hours. No results for input(s): AMMONIA in the last 168 hours. Coagulation Profile: No results for input(s): INR, PROTIME in the last 168 hours. Cardiac Enzymes: No results for input(s): CKTOTAL, CKMB, CKMBINDEX, TROPONINI in the last 168 hours. BNP (last 3 results) No results for input(s): PROBNP in the last 8760 hours. HbA1C: No results for input(s): HGBA1C in the last 72 hours. CBG: No results for input(s): GLUCAP in the last 168 hours. Lipid Profile: No results for input(s): CHOL, HDL, LDLCALC, TRIG, CHOLHDL, LDLDIRECT in the last 72 hours. Thyroid Function Tests: No results for input(s): TSH, T4TOTAL, FREET4, T3FREE, THYROIDAB in the last 72 hours. Anemia Panel: No results for input(s): VITAMINB12, FOLATE, FERRITIN, TIBC, IRON, RETICCTPCT in the last 72 hours. Sepsis Labs: Recent Labs  Lab 08/07/19 0405 08/07/19 0623  08/08/19 0406 08/08/19 0832 08/08/19 1049 08/09/19 0407  PROCALCITON  --   --  <0.10  --   --  <0.10  LATICACIDVEN 2.7* 2.0*  --  1.4 2.0*  --     Recent Results (from the past 240 hour(s))  SARS Coronavirus 2 [facility] order, Performed in [facility] hospital lab) Nasopharyngeal Nasopharyngeal Swab     Status: None   Collection Time: 08/07/19  4:15 AM   Specimen: Nasopharyngeal Swab  Result Value Ref Range Status   SARS Coronavirus 2 NEGATIVE NEGATIVE Final    Comment: (NOTE) If result is NEGATIVE SARS-CoV-2 target nucleic acids are NOT DETECTED. The SARS-CoV-2 RNA is generally detectable in upper and lower  respiratory specimens during the acute phase of infection. The lowest  concentration of SARS-CoV-2 viral copies this assay can detect is 250  copies / mL. A negative result does not preclude SARS-CoV-2 infection  and should not be used as the sole basis for treatment or other  patient management decisions.  A negative result may occur with  improper specimen collection / handling, submission of specimen other  than nasopharyngeal swab, presence of viral mutation(s) within the  areas targeted by this assay, and inadequate number of viral copies  (<250 copies / mL). A negative result must be combined with clinical  observations, patient history, and epidemiological information. If result is POSITIVE SARS-CoV-2 target nucleic acids are DETECTED. The SARS-CoV-2 RNA is generally detectable in upper and lower  respiratory specimens dur ing the acute phase of infection.  Positive  results are indicative of active infection with SARS-CoV-2.  Clinical  correlation with patient history and other diagnostic information is  necessary to determine patient infection status.  Positive results do  not rule out bacterial infection or co-infection with other viruses. If result is PRESUMPTIVE POSTIVE SARS-CoV-2 nucleic acids MAY BE PRESENT.   A presumptive positive result was obtained on  the submitted specimen  and confirmed on repeat testing.  While 2019 novel coronavirus  (SARS-CoV-2) nucleic acids may be present in the submitted sample  additional confirmatory testing may be necessary for epidemiological  and / or clinical management purposes  to differentiate between  SARS-CoV-2 and other Sarbecovirus currently known to infect humans.  If clinically indicated additional testing with an alternate test  methodology [phone] is advised. The SARS-CoV-2 RNA is generally  detectable in upper and lower respiratory sp ecimens during the acute  phase of infection. The expected result is Negative. Fact Sheet for Patients:  BoilerBrush.com.cyhttps://www.fda.gov/media/136312/download Fact Sheet for Healthcare Providers: https://pope.com/https://www.fda.gov/media/136313/download This test is not yet approved or cleared by the [place] FDA and has been authorized for detection and/or diagnosis of SARS-CoV-2 by FDA under an Emergency Use Authorization (EUA).  This EUA will remain in effect (meaning this test can be used) for the duration of the COVID-19 declaration under Section 564(b)(1) of the Act, 21 U.S.C. section 360bbb-3(b)(1), unless the authorization is terminated or revoked sooner. Performed at [facility], [address] [place], [place] [address]   Respiratory Panel by PCR     Status: None   Collection Time: 08/07/19  6:32 PM   Specimen: Nasopharyngeal Swab; Respiratory  Result Value Ref Range Status   Adenovirus NOT DETECTED NOT DETECTED Final   Coronavirus 229E NOT DETECTED NOT DETECTED Final    Comment: (NOTE) The Coronavirus on the Respiratory Panel, DOES NOT test for the novel  Coronavirus (2019 nCoV)    Coronavirus HKU1 NOT DETECTED NOT DETECTED Final   Coronavirus NL63 NOT DETECTED NOT DETECTED Final   Coronavirus OC43 NOT DETECTED NOT DETECTED Final   Metapneumovirus NOT DETECTED NOT DETECTED Final   Rhinovirus / Enterovirus NOT DETECTED NOT DETECTED Final   Influenza A  NOT DETECTED NOT DETECTED Final   Influenza B NOT DETECTED NOT DETECTED Final   Parainfluenza Virus 1 NOT DETECTED NOT DETECTED Final   Parainfluenza Virus 2 NOT DETECTED NOT DETECTED Final   Parainfluenza Virus 3 NOT DETECTED NOT DETECTED Final   Parainfluenza Virus 4 NOT DETECTED NOT DETECTED Final   Respiratory Syncytial Virus NOT DETECTED NOT DETECTED Final   Bordetella pertussis NOT DETECTED NOT DETECTED Final   Chlamydophila pneumoniae NOT DETECTED NOT DETECTED Final   Mycoplasma pneumoniae NOT DETECTED NOT DETECTED Final    Comment: Performed at [facility], [address] [place], [place] [address]  MRSA PCR Screening     Status: None   Collection Time: 08/10/19  2:11 PM   Specimen: Nasal Mucosa; Nasopharyngeal  Result Value Ref Range Status   MRSA by PCR NEGATIVE NEGATIVE Final    Comment:        The GeneXpert MRSA Assay (FDA approved for NASAL specimens only), is one component of a comprehensive MRSA colonization surveillance program. It is not intended to diagnose MRSA infection nor to guide or monitor treatment for MRSA infections. Performed at [facility], [address] [place], [place] [address]      Radiology Studies: No results found.  Scheduled Meds: . aspirin  81 mg Oral Daily  . atorvastatin  80 mg Oral q1800  . buPROPion  100 mg Oral BID  . carvedilol  3.125 mg Oral BID WC  . clopidogrel  75 mg Oral Q breakfast  . enoxaparin (LOVENOX) injection  40 mg Subcutaneous Q24H  . furosemide  40 mg Oral Daily  . gabapentin  300 mg Oral Q8H  . HYDROcodone-acetaminophen  1 tablet Oral TID  . hydroxychloroquine  200 mg Oral BID  . ipratropium-albuterol  3 mL Nebulization BID  . mirtazapine  7.5 mg Oral QHS  . multivitamin with minerals  1 tablet Oral Daily  .  nicotine  7 mg Transdermal Daily  . nutrition supplement (JUVEN)  1 packet Oral BID BM  . polyethylene glycol  17 g Oral Daily  . potassium chloride  40 mEq Oral Q6H  . predniSONE  50  mg Oral Q breakfast  . saccharomyces boulardii  250 mg Oral BID  . sodium chloride flush  3 mL Intravenous Q12H  . spironolactone  12.5 mg Oral QHS  . tamsulosin  0.4 mg Oral QPC supper   Continuous Infusions: . sodium chloride       LOS: 5 days   Rickey BarbaraStephen Chiu, MD [facility] Pager On Amion  If 7PM-7AM, please contact night-coverage 08/12/2019, 2:48 PM

## 2019-08-13 LAB — BASIC METABOLIC PANEL
Anion gap: 7 (ref 5–15)
BUN: 18 mg/dL (ref 8–23)
CO2: 30 mmol/L (ref 22–32)
Calcium: 8.1 mg/dL — ABNORMAL LOW (ref 8.9–10.3)
Chloride: 102 mmol/L (ref 98–111)
Creatinine, Ser: 0.75 mg/dL (ref 0.61–1.24)
GFR calc Af Amer: >60 mL/min (ref >60)
GFR calc non Af Amer: >60 mL/min (ref >60)
Glucose, Bld: 98 mg/dL (ref 70–99)
Potassium: 3.8 mmol/L (ref 3.5–5.1)
Sodium: 139 mmol/L (ref 135–145)

## 2019-08-13 LAB — MAGNESIUM: Magnesium: 2 mg/dL (ref 1.7–2.4)

## 2019-08-13 MED ORDER — POTASSIUM CHLORIDE CRYS ER 20 MEQ PO TBCR
40.0000 meq | EXTENDED_RELEASE_TABLET | Freq: Once | ORAL | Status: AC
  Administered 2019-08-13: 09:00:00 40 meq via ORAL
  Filled 2019-08-13: qty 2

## 2019-08-13 MED ORDER — ALBUTEROL SULFATE HFA 108 (90 BASE) MCG/ACT IN AERS: 2.0000 | INHALATION_SPRAY | Freq: Four times a day (QID) | RESPIRATORY_TRACT | 0 refills | Status: AC | PRN

## 2019-08-13 MED ORDER — PREDNISONE 10 MG PO TABS: ORAL_TABLET | ORAL | Status: DC

## 2019-08-13 MED ORDER — ALBUTEROL SULFATE (2.5 MG/3ML) 0.083% IN NEBU: 2.5000 mg | INHALATION_SOLUTION | Freq: Four times a day (QID) | RESPIRATORY_TRACT | 0 refills | Status: AC | PRN

## 2019-08-13 MED ORDER — CARVEDILOL 3.125 MG PO TABS: 3.1250 mg | ORAL_TABLET | Freq: Two times a day (BID) | ORAL | 0 refills | Status: AC

## 2019-08-13 MED ORDER — FUROSEMIDE 40 MG PO TABS: 40.0000 mg | ORAL_TABLET | Freq: Every day | ORAL | 0 refills | Status: AC

## 2019-08-13 MED ORDER — SPIRONOLACTONE 25 MG PO TABS: 12.5000 mg | ORAL_TABLET | Freq: Every day | ORAL | 0 refills | Status: AC

## 2019-08-13 MED ORDER — CLOPIDOGREL BISULFATE 75 MG PO TABS: 75.0000 mg | ORAL_TABLET | Freq: Every day | ORAL | 0 refills | Status: AC

## 2019-08-13 NOTE — Progress Notes (Signed)
Ryan Can, MD about pt's soft BP, MD ok to hold morning dose of Coreg, resume afternoon dose.

## 2019-08-13 NOTE — Discharge Summary (Signed)
Physician Discharge Summary  [name] WUJ:[id number] DOB: [birth year] DOA: 08/07/2019  PCP: [name], PA-C  Admit date: 08/07/2019 Discharge date: 08/13/2019  Admitted From: Home Disposition:  Home  Recommendations for Outpatient Follow-up:  1. Follow up with PCP in 1-2 weeks 2. F/u with Cardiology as scheduled 3. F/u with Pulmonary as scheduled  Home Health:PT,OT,RN   Discharge Condition:Stable CODE STATUS:Full Diet recommendation: Heart healthy   Brief/Interim Summary: 63 year old male with history of CVA, rheumatoid arthritis, coronary artery disease, interstitial lung disease, COPD, combined congestive heart failure, abdominal aortic aneurysm who presented with shortness of breath. He follows with Dr. [name] as an outpatient. He follows with rheumatology for his rheumatoid arthritis. He presented with shortness of breath. He is on 2 L of oxygen at night for OSA. On presentation ,he required 5 to 6 L of oxygen per minute. Chest x-ray was concerning for possible infiltrates. COVID-19 was negative .currently being managed for interstitial lung disease secondary to rheumatoid arthritis. PCCM following. Echocardiogram showed decreased ejection fraction from last echo with current ejection fraction of 20 to 25%. Cardiology also consulted and he underwentright and left heart catheterization today with finding of RCA stenosis. PCI done with stent.  Discharge Diagnoses:  Principal Problem:   Acute on chronic respiratory failure with hypoxia (HCC) Active Problems:   Atherosclerotic heart disease of native coronary artery with other forms of angina pectoris (HCC)   ILD (interstitial lung disease) (HCC)   HTN (hypertension)   Current smoker   Chronic combined systolic and diastolic CHF (congestive heart failure) (HCC)   Chronic pain syndrome   Rheumatoid arthritis (HCC)   Acute hypoxic respiratory failure: -Secondary to progression of his ILD due to  rheumatoid arthritis. Chest x-ray was initially suspicious for pneumonia. CT angiogram did not show any pulmonary embolism. High resolution chest CT showed similar pulmonary fibrosis, bronchiectasis, peribronchovascular groundglass. Also showed new nodule of the right pulmonary apex measuring 9 mm. Procalcitonin normal. Pneumonia suspected to be less likely so antibiotics discontinued. Currently on steroids. PCCM wasfollowing. -PCCM recommended 3 weeks of steroid taper, Lasix. -Will likely require chronic home O2. Cont to wean O2 as tolerated. Currently on 4LNC  Chronic combined congestive heart failure: -Echo done here showed ejection is on 20-25%. andLeft ventricular diastolic -Doppler parameters are consistent with pseudonormalization pattern of LV diastolic filling. -He has ischemic cardiomyopathy.Cardiology following and pt underwentright and left heart catheterization 10/10 with finding of RCA stenosis. PCI done with stent.Continue carvedilol. Was on IV lasix, transitioned to PO per Cardiology on 10/9 -Therapy recommendations for SNF, however pt declines and has decided on home health services instead  CAD: -Cath showed RCA lesion with 80% stenosis with stent placed.  -Continue on aspirin ,Plavix. -Chest pain free at this time  Rheumatoid arthritis: -Continued on Plaquenil.  -Follows with rheumatology as an outpatient -Seems stable at this time  Hypertension: -Current blood remains stable this AM.  -Continue current regimen  Tobacco dependence:  -Tobacco cessation was done this admit.  Hyperlipidemia:  -Continue Lipitor as tolerated, stable  Debility/deconditioning:  -Patientevaluatedby PT/OT and recommendedSNF on DC.  -Pt has declined SNF and instead prefers home health services   Discharge Instructions  Discharge Instructions    AMB Referral to Cardiac Rehabilitation - Phase II   Complete by: As directed    Diagnosis: Coronary  Stents   After initial evaluation and assessments completed: Virtual Based Care may be provided alone or in conjunction with Phase 2 Cardiac Rehab based on patient barriers.: Yes  Allergies as of 08/13/2019   No Known Allergies     Medication List    TAKE these medications   acetaminophen 500 MG tablet Commonly known as: TYLENOL Take 1,000 mg by mouth every 6 (six) hours as needed for mild pain.   albuterol 108 (90 Base) MCG/ACT inhaler Commonly known as: Proventil HFA Inhale 2 puffs into the lungs every 6 (six) hours as needed for wheezing or shortness of breath. What changed: See the new instructions.   albuterol (2.5 MG/3ML) 0.083% nebulizer solution Commonly known as: PROVENTIL Take 3 mLs (2.5 mg total) by nebulization every 6 (six) hours as needed for wheezing or shortness of breath. What changed: Another medication with the same name was changed. Make sure you understand how and when to take each.   aspirin EC 81 MG tablet Take 81 mg by mouth daily.   atorvastatin 40 MG tablet Commonly known as: LIPITOR Take 1 tablet (40 mg total) by mouth daily at 6 PM. What changed: how much to take   buPROPion 100 MG 12 hr tablet Commonly known as: WELLBUTRIN SR Take 100 mg by mouth 2 (two) times daily.   carvedilol 3.125 MG tablet Commonly known as: COREG Take 1 tablet (3.125 mg total) by mouth 2 (two) times daily with a meal. What changed:   medication strength  how much to take   cholecalciferol 25 MCG (1000 UT) tablet Commonly known as: VITAMIN D3 Take 1,000 Units by mouth daily.   clopidogrel 75 MG tablet Commonly known as: PLAVIX Take 1 tablet (75 mg total) by mouth daily with breakfast. Start taking on: August 14, 2019   furosemide 40 MG tablet Commonly known as: LASIX Take 1 tablet (40 mg total) by mouth daily. Start taking on: August 14, 2019   gabapentin 300 MG capsule Commonly known as: Neurontin Take 1 capsule (300 mg total) by mouth every 8  (eight) hours. Notes to patient: This evening   guaiFENesin 600 MG 12 hr tablet Commonly known as: MUCINEX Take 600 mg by mouth 2 (two) times daily as needed for cough or to loosen phlegm.   HYDROcodone-acetaminophen 10-325 MG tablet Commonly known as: Norco Take 1 tablet by mouth 3 (three) times daily.   hydroxychloroquine 200 MG tablet Commonly known as: PLAQUENIL Take 1 tablet (200 mg total) by mouth 2 (two) times daily.   magnesium hydroxide 400 MG/5ML suspension Commonly known as: MILK OF MAGNESIA Take 30 mLs by mouth daily as needed for mild constipation. And at bedtime   mirtazapine 7.5 MG tablet Commonly known as: REMERON Take 7.5 mg by mouth at bedtime.   MUSCLE RUB EX Apply 1 application topically as needed (for shoulder or knee pain).   nicotine 7 mg/24hr patch Commonly known as: NICODERM CQ - dosed in mg/24 hr Place 1 patch (7 mg total) onto the skin daily.   nutrition supplement (JUVEN) Pack Take 1 packet by mouth 2 (two) times daily between meals.   nystatin cream Commonly known as: MYCOSTATIN Apply topically 2 (two) times daily. What changed: how much to take   polyethylene glycol 17 g packet Commonly known as: MIRALAX / GLYCOLAX Take 17 g by mouth 2 (two) times daily. Until having 1 soft BM daily, then can decrease to daily or as needed What changed: when to take this   potassium chloride SA 20 MEQ tablet Commonly known as: KLOR-CON Take 2 tablets (40 mEq total) by mouth daily. What changed:   how much to take  when to take  this   predniSONE 10 MG tablet Commonly known as: DELTASONE Taper dose:  daily x 4 days, then  po daily x 4 days, then  po daily x 4 days, then  po daily x 4 days, then  po daily x 4 days, then stop. Zero refills.   saccharomyces boulardii 250 MG capsule Commonly known as: FLORASTOR Take 1 capsule (250 mg total) by mouth 2 (two) times daily.   spironolactone 25 MG tablet Commonly known as:  ALDACTONE Take 0.5 tablets (12.5 mg total) by mouth at bedtime.   tamsulosin 0.4 MG Caps capsule Commonly known as: Flomax Take 1 capsule (0.4 mg total) by mouth daily after supper.            Durable Medical Equipment  (From admission, onward)         Start     Ordered   08/13/19 0907  For home use only DME Nebulizer machine  Once    Question Answer Comment  Patient needs a nebulizer to treat with the following condition ILD (interstitial lung disease) (HCC)   Length of Need Lifetime      08/13/19 0907   08/13/19 0907  For home use only DME oxygen  Once    Question Answer Comment  Length of Need Lifetime   Mode or (Route) Nasal cannula   Liters per Minute 4   Oxygen delivery system Gas      08/13/19 0907   08/13/19 0907  For home use only DME standard Ryan wheelchair with seat cushion  Once    Comments: Patient suffers from ILD which impairs their ability to perform daily activities like bathing in the home.  A cane will not resolve issue with performing activities of daily living. A wheelchair will allow patient to safely perform daily activities. Patient can safely propel the wheelchair in the home or has a caregiver who can provide assistance. Length of need Lifetime. Accessories: elevating leg rests (ELRs), wheel locks, extensions and anti-tippers.   08/13/19 1610         Follow-up Information    Laurey Morale, MD Follow up.   Specialty: Cardiology Why: office will call you with time and date of appointment  Contact information: [address] [place] [address] [phone]        [name], PA-C. Schedule an appointment as soon as possible for a visit in 1 week(s).   Specialty: Physician Assistant Contact information: [address] [place] [address] [phone]        Lars Masson, MD .   Specialty: Cardiology Contact information: [address] [place] [address] [phone]         Kalman Shan, MD. Schedule an appointment as soon as possible for a visit.   Specialty: Pulmonary Disease Contact information: [address] [place] [address] [phone]        [facility] Follow up.   Specialty: Home Health Services Why: For home health serivices as arranged by family Contact information: [address] [place] [address] [phone]        [facility] Follow up.   Why: Ocygen, wheelchair and nebulizer will be delivered to room prior to DC.  Contact information: [address] [place] [address] [phone]          No Known Allergies  Consultations:  PCCM  Cardiology  Procedures/Studies: Ct Angio Chest Pe W Or Wo Contrast  Result Date: 08/07/2019 CLINICAL DATA:  Worsening acute on chronic respiratory failure. History of rheumatoid arthritis related interstitial lung disease. EXAM: CT ANGIOGRAPHY CHEST WITH CONTRAST TECHNIQUE: Multidetector CT imaging of the chest was performed using the standard protocol during bolus administration of intravenous contrast. Multiplanar CT image reconstructions and MIPs were obtained to evaluate the vascular anatomy. CONTRAST:  OMNIPAQUE IOHEXOL 350 MG/ML SOLN COMPARISON:  High-resolution chest CT dated August 17, 2017. FINDINGS: Cardiovascular: Satisfactory opacification of the pulmonary arteries to the segmental level. No evidence of pulmonary embolism. Unchanged moderate cardiomegaly. No pericardial effusion. No thoracic aortic aneurysm. Coronary, aortic arch, and branch vessel atherosclerotic vascular disease. Prior CABG. Mediastinum/Nodes: Multiple prominent subcentimeter and borderline enlarged mediastinal and hilar lymph nodes are similar to prior study and likely reactive. No enlarged axillary lymph nodes. The thyroid gland, trachea, and esophagus demonstrate no significant findings. Lungs/Pleura: New small right pleural effusion. Patchy  areas of ground-glass density, septal thickening, subpleural reticulation, mild bronchiectasis and bronchiolectasis, and mild honeycombing are again noted, with slightly more prominent interlobular septal thickening and hazy ground-glass density when compared to prior study from October 2018. No consolidation or pneumothorax. Upper Abdomen: No acute abnormality. Punctate right nephrolithiasis. Musculoskeletal: No chest wall abnormality. No acute or significant osseous findings. Review of the MIP images confirms the above findings. IMPRESSION: 1.  No evidence of pulmonary embolism. 2. Chronic interstitial lung disease. Slightly more prominent interlobular septal thickening and hazy ground-glass density throughout both lungs when compared to the prior study may reflect a degree of pulmonary edema given new small right pleural effusion. 3.  Aortic atherosclerosis (ICD10-I70.0). Electronically Signed   By: Obie Dredge M.D.   On: 08/07/2019 22:25   Ct Chest High Resolution  Result Date: 08/08/2019 CLINICAL DATA:  Acute on chronic respiratory failure, interstitial lung disease, known rheumatoid associated ILD EXAM: CT CHEST WITHOUT CONTRAST TECHNIQUE: Multidetector CT imaging of the chest was performed following the standard protocol without intravenous contrast. High resolution imaging of the lungs was performed. Inspiratory and expiratory phase imaging was not submitted for review. COMPARISON:  08/17/2017 04/05/2017, 02/09/2017 FINDINGS: Cardiovascular: Aortic atherosclerosis. Aortic valve calcifications. Cardiomegaly with extensive 3 vessel coronary artery calcifications and/or stents status post median sternotomy and CABG. No pericardial effusion. Enlargement of the main pulmonary artery up to 3.9 cm. Mediastinum/Nodes: Unchanged enlarged mediastinal lymph nodes. Thyroid gland, trachea, and esophagus demonstrate no significant findings. Lungs/Pleura: There is moderate to severe pulmonary fibrosis in a pattern  without clear gradient, featuring predominantly tubular bronchiectasis, extensive subpleural and peribronchovascular bronchiolectasis, and extensive peribronchovascular ground-glass. There are possible areas of honeycombing, for example in the lateral segment right middle lobe. Findings, particularly bronchiolectasis, are slightly worsened over time in comparison to examination dated 08/17/2017, although there may be some component of acutely superimposed ground-glass which is difficult to account for. There is mild, diffuse interlobular septal thickening the extent that the septa remain anatomically intact. There is a new nodule of the right pulmonary apex measuring 9 mm (series 8, image 39). There are other areas of less well-defined new ground-glass, for example in the left apex (series 12, image 83). Small right, trace left pleural effusions. Upper Abdomen: No acute abnormality. Musculoskeletal: No chest wall mass or suspicious bone lesions identified. IMPRESSION: 1. There is moderate to severe pulmonary fibrosis in a pattern without clear gradient, featuring predominantly tubular bronchiectasis, extensive subpleural and peribronchovascular bronchiolectasis, and extensive peribronchovascular ground-glass. There are possible areas of honeycombing, for example in the lateral segment right middle lobe. Findings, particularly bronchiolectasis,  are slightly worsened over time in comparison to examination dated 08/17/2017, although there may be some component of acutely superimposed ground-glass which is difficult to account for. Findings remain generally in keeping with known diagnosis of rheumatoid associated ILD. 2. Mild, diffuse interlobular septal thickening and small right, trace left pleural effusions. There are scattered areas of new ground-glass, for example in the left pulmonary apex. Findings consistent with edema, atypical/viral infection, or acute inflammatory flare of interstitial lung disease. 3. There  is a new nodule of the right pulmonary apex measuring 9 mm (series 8, image 39). Recommend CT follow-up in 3-6 months to assess for stability or resolution of this possibly infectious or inflammatory airspace opacity. 4.  Coronary artery disease.  Aortic Atherosclerosis (ICD10-I70.0). 5. Enlargement of the main pulmonary artery, as can be pulmonary hypertension. Electronically Signed   By: Lauralyn Primes M.D.   On: 08/08/2019 08:48   Dg Chest Port 1 View  Result Date: 08/09/2019 CLINICAL DATA:  Hypoxia EXAM: PORTABLE CHEST 1 VIEW COMPARISON:  08/07/2019 FINDINGS: Postsurgical changes to the sternum and mediastinum. Stable cardiomegaly. Diffuse coarsened interstitial markings compatible with known chronic interstitial lung disease. No superimposed new airspace consolidation. No large pleural fluid collection. No pneumothorax. IMPRESSION: Extensive chronic interstitial lung changes without a superimposed airspace consolidation. Electronically Signed   By: Duanne Guess M.D.   On: 08/09/2019 10:48   Dg Chest Port 1 View  Result Date: 08/07/2019 CLINICAL DATA:  Shortness of breath EXAM: PORTABLE CHEST 1 VIEW COMPARISON:  06/04/2018 FINDINGS: Generalized coarse interstitial opacity. Cardiomegaly. CABG. No acute finding when compared to prior. The PICC has been removed. IMPRESSION: 1. Pulmonary fibrosis with stable aeration compared to study last month. Pneumonia could easily be obscured given the extent of chronic lung disease. 2. Cardiomegaly. Electronically Signed   By: Marnee Spring M.D.   On: 08/07/2019 05:22    Subjective: Eager to go home  Discharge Exam: Vitals:   08/13/19 0748 08/13/19 0750  BP: 90/61 98/64  Pulse: 75   Resp: (!) 21   Temp: 97.8 F (36.6 C)   SpO2: 95%    Vitals:   08/13/19 0718 08/13/19 0735 08/13/19 0748 08/13/19 0750  BP:  99/63 90/61 98/64   Pulse:  75 75   Resp:  20 (!) 21   Temp:   97.8 F (36.6 C)   TempSrc:   Oral   SpO2: 98% 95% 95%   Weight:       Height:        General: Pt is alert, awake, not in acute distress Cardiovascular: RRR, S1/S2 +, no rubs, no gallops Respiratory: CTA bilaterally, no wheezing, no rhonchi Abdominal: Soft, NT, ND, bowel sounds + Extremities: no edema, no cyanosis   The results of significant diagnostics from this hospitalization (including imaging, microbiology, ancillary and laboratory) are listed below for reference.     Microbiology: Recent Results (from the past 240 hour(s))  SARS Coronavirus 2 [facility] order, Performed in [facility] hospital lab) Nasopharyngeal Nasopharyngeal Swab     Status: None   Collection Time: 08/07/19  4:15 AM   Specimen: Nasopharyngeal Swab  Result Value Ref Range Status   SARS Coronavirus 2 NEGATIVE NEGATIVE Final    Comment: (NOTE) If result is NEGATIVE SARS-CoV-2 target nucleic acids are NOT DETECTED. The SARS-CoV-2 RNA is generally detectable in upper and lower  respiratory specimens during the acute phase of infection. The lowest  concentration of SARS-CoV-2 viral copies this assay can detect is 250  copies / mL.  A negative result does not preclude SARS-CoV-2 infection  and should not be used as the sole basis for treatment or other  patient management decisions.  A negative result may occur with  improper specimen collection / handling, submission of specimen other  than nasopharyngeal swab, presence of viral mutation(s) within the  areas targeted by this assay, and inadequate number of viral copies  (<250 copies / mL). A negative result must be combined with clinical  observations, patient history, and epidemiological information. If result is POSITIVE SARS-CoV-2 target nucleic acids are DETECTED. The SARS-CoV-2 RNA is generally detectable in upper and lower  respiratory specimens dur ing the acute phase of infection.  Positive  results are indicative of active infection with SARS-CoV-2.  Clinical  correlation with patient history and other diagnostic  information is  necessary to determine patient infection status.  Positive results do  not rule out bacterial infection or co-infection with other viruses. If result is PRESUMPTIVE POSTIVE SARS-CoV-2 nucleic acids MAY BE PRESENT.   A presumptive positive result was obtained on the submitted specimen  and confirmed on repeat testing.  While 2019 novel coronavirus  (SARS-CoV-2) nucleic acids may be present in the submitted sample  additional confirmatory testing may be necessary for epidemiological  and / or clinical management purposes  to differentiate between  SARS-CoV-2 and other Sarbecovirus currently known to infect humans.  If clinically indicated additional testing with an alternate test  methodology [phone] is advised. The SARS-CoV-2 RNA is generally  detectable in upper and lower respiratory sp ecimens during the acute  phase of infection. The expected result is Negative. Fact Sheet for Patients:  BoilerBrush.com.cyhttps://www.fda.gov/media/136312/download Fact Sheet for Healthcare Providers: https://pope.com/https://www.fda.gov/media/136313/download This test is not yet approved or cleared by the [place] FDA and has been authorized for detection and/or diagnosis of SARS-CoV-2 by FDA under an Emergency Use Authorization (EUA).  This EUA will remain in effect (meaning this test can be used) for the duration of the COVID-19 declaration under Section 564(b)(1) of the Act, 21 U.S.C. section 360bbb-3(b)(1), unless the authorization is terminated or revoked sooner. Performed at [facility], [address] [place], [place] [address]   Respiratory Panel by PCR     Status: None   Collection Time: 08/07/19  6:32 PM   Specimen: Nasopharyngeal Swab; Respiratory  Result Value Ref Range Status   Adenovirus NOT DETECTED NOT DETECTED Final   Coronavirus 229E NOT DETECTED NOT DETECTED Final    Comment: (NOTE) The Coronavirus on the Respiratory Panel, DOES NOT test for the novel  Coronavirus (2019 nCoV)     Coronavirus HKU1 NOT DETECTED NOT DETECTED Final   Coronavirus NL63 NOT DETECTED NOT DETECTED Final   Coronavirus OC43 NOT DETECTED NOT DETECTED Final   Metapneumovirus NOT DETECTED NOT DETECTED Final   Rhinovirus / Enterovirus NOT DETECTED NOT DETECTED Final   Influenza A NOT DETECTED NOT DETECTED Final   Influenza B NOT DETECTED NOT DETECTED Final   Parainfluenza Virus 1 NOT DETECTED NOT DETECTED Final   Parainfluenza Virus 2 NOT DETECTED NOT DETECTED Final   Parainfluenza Virus 3 NOT DETECTED NOT DETECTED Final   Parainfluenza Virus 4 NOT DETECTED NOT DETECTED Final   Respiratory Syncytial Virus NOT DETECTED NOT DETECTED Final   Bordetella pertussis NOT DETECTED NOT DETECTED Final   Chlamydophila pneumoniae NOT DETECTED NOT DETECTED Final   Mycoplasma pneumoniae NOT DETECTED NOT DETECTED Final    Comment: Performed at [facility], [address] [place], [place] [address]  MRSA PCR Screening     Status: None   Collection Time: 08/10/19  2:11 PM   Specimen: Nasal Mucosa; Nasopharyngeal  Result Value Ref Range Status   MRSA by PCR NEGATIVE NEGATIVE Final    Comment:        The GeneXpert MRSA Assay (FDA approved for NASAL specimens only), is one component of a comprehensive MRSA colonization surveillance program. It is not intended to diagnose MRSA infection nor to guide or monitor treatment for MRSA infections. Performed at [facility], [address] [place], [place] [address]      Labs: BNP (last 3 results) Recent Labs    08/07/19 0405  BNP 304.3*   Basic Metabolic Panel: Recent Labs  Lab 08/08/19 0406 08/10/19 0401 08/10/19 0821 08/10/19 1004 08/11/19 0211 08/12/19 0403 08/12/19 1151 08/13/19 0419  NA 138 141 143  144 143 138 139  --  139  K 4.0 3.9 3.8  3.7 3.8 3.3* 3.5  --  3.8  CL 106 104  --   --  96* 99  --  102  CO2 24 28  --   --  29 28  --  30  GLUCOSE 172* 128*  --   --  83 115*  --  98  BUN 20 23  --   --  19 19  --  18   CREATININE 0.94 0.93  --   --  0.67 0.79  --  0.75  CALCIUM 8.3* 8.1*  --   --  7.8* 8.3*  --  8.1*  MG  --   --   --   --   --   --  2.1 2.0   Liver Function Tests: Recent Labs  Lab 08/07/19 0405 08/12/19 0403  AST 19 12*  ALT 15 16  ALKPHOS 79 60  BILITOT 1.7* 0.9  PROT 7.2 6.0*  ALBUMIN 2.4* 2.1*   No results for input(s): LIPASE, AMYLASE in the last 168 hours. No results for input(s): AMMONIA in the last 168 hours. CBC: Recent Labs  Lab 08/07/19 0405 08/08/19 0406 08/09/19 0407 08/10/19 0821 08/10/19 1004 08/11/19 0211 08/12/19 0403  WBC 9.9 5.5 3.4*  --   --  8.6 8.0  NEUTROABS 8.2* 4.3  --   --   --  5.7  --   HGB 13.3 10.7* 9.7* 10.5*  10.2* 11.2* 11.4* 10.8*  HCT 41.8 33.9* 32.5* 31.0*  30.0* 33.0* 37.1* 34.3*  MCV 82.3 82.3 86.2  --   --  82.4 82.7  PLT 240 195 174  --   --  241 204   Cardiac Enzymes: No results for input(s): CKTOTAL, CKMB, CKMBINDEX, TROPONINI in the last 168 hours. BNP: Invalid input(s): POCBNP CBG: No results for input(s): GLUCAP in the last 168 hours. D-Dimer No results for input(s): DDIMER in the last 72 hours. Hgb A1c No results for input(s): HGBA1C in the last 72 hours. Lipid Profile No results for input(s): CHOL, HDL, LDLCALC, TRIG, CHOLHDL, LDLDIRECT in the last 72 hours. Thyroid function studies No results for input(s): TSH, T4TOTAL, T3FREE, THYROIDAB in the last 72 hours.  Invalid input(s): FREET3 Anemia work up No results for input(s): VITAMINB12, FOLATE, FERRITIN, TIBC, IRON, RETICCTPCT in the last 72 hours. Urinalysis    Component Value Date/Time   COLORURINE RED (A) 06/04/2018 1525   APPEARANCEUR CLOUDY (A) 06/04/2018 1525   LABSPEC >1.030 (H) 06/04/2018 1525   PHURINE 6.5 06/04/2018 1525   GLUCOSEU 100 (A) 06/04/2018 1525  HGBUR LARGE (A) 06/04/2018 1525   BILIRUBINUR NEGATIVE 06/04/2018 1525   KETONESUR TRACE (A) 06/04/2018 1525   PROTEINUR 100 (A) 06/04/2018 1525   NITRITE POSITIVE (A) 06/04/2018 1525    LEUKOCYTESUR TRACE (A) 06/04/2018 1525   Sepsis Labs Invalid input(s): PROCALCITONIN,  WBC,  LACTICIDVEN Microbiology Recent Results (from the past 240 hour(s))  SARS Coronavirus 2 [facility](Hospital order, Performed [facility]) Nasopharyngeal Nasopharyngeal Swab     Status: None   Collection Time: 08/07/19  4:15 AM   Specimen: Nasopharyngeal Swab  Result Value Ref Range Status   SARS Coronavirus 2 NEGATIVE NEGATIVE Final    Comment: (NOTE) If result is NEGATIVE SARS-CoV-2 target nucleic acids are NOT DETECTED. The SARS-CoV-2 RNA is generally detectable in upper and lower  respiratory specimens during the acute phase of infection. The lowest  concentration of SARS-CoV-2 viral copies this assay can detect is 250  copies / mL. A negative result does not preclude SARS-CoV-2 infection  and should not be used as the sole basis for treatment or other  patient management decisions.  A negative result may occur with  improper specimen collection / handling, submission of specimen other  than nasopharyngeal swab, presence of viral mutation(s) within the  areas targeted by this assay, and inadequate number of viral copies  (<250 copies / mL). A negative result must be combined with clinical  observations, patient history, and epidemiological information. If result is POSITIVE SARS-CoV-2 target nucleic acids are DETECTED. The SARS-CoV-2 RNA is generally detectable in upper and lower  respiratory specimens dur ing the acute phase of infection.  Positive  results are indicative of active infection with SARS-CoV-2.  Clinical  correlation with patient history and other diagnostic information is  necessary to determine patient infection status.  Positive results do  not rule out bacterial infection or co-infection with other viruses. If result is PRESUMPTIVE POSTIVE SARS-CoV-2 nucleic acids MAY BE PRESENT.   A presumptive positive result was obtained on the submitted specimen  and  confirmed on repeat testing.  While 2019 novel coronavirus  (SARS-CoV-2) nucleic acids may be present in the submitted sample  additional confirmatory testing may be necessary for epidemiological  and / or clinical management purposes  to differentiate between  SARS-CoV-2 and other Sarbecovirus currently known to infect humans.  If clinically indicated additional testing with an alternate test  methodology [phone]([id number]) is advised. The SARS-CoV-2 RNA is generally  detectable in upper and lower respiratory sp ecimens during the acute  phase of infection. The expected result is Negative. Fact Sheet for Patients:  BoilerBrush.com.cyhttps://www.fda.gov/media/136312/download Fact Sheet for Healthcare Providers: https://pope.com/https://www.fda.gov/media/136313/download This test is not yet approved or cleared by the [place] FDA and has been authorized for detection and/or diagnosis of SARS-CoV-2 by FDA under an Emergency Use Authorization (EUA).  This EUA will remain in effect (meaning this test can be used) for the duration of the COVID-19 declaration under Section 564(b)(1) of the Act, 21 U.S.C. section 360bbb-3(b)(1), unless the authorization is terminated or revoked sooner. Performed at [facility], [address] [place], [place] [address]   Respiratory Panel by PCR     Status: None   Collection Time: 08/07/19  6:32 PM   Specimen: Nasopharyngeal Swab; Respiratory  Result Value Ref Range Status   Adenovirus NOT DETECTED NOT DETECTED Final   Coronavirus 229E NOT DETECTED NOT DETECTED Final    Comment: (NOTE) The Coronavirus on the Respiratory Panel, DOES NOT test for the novel  Coronavirus (2019  nCoV)    Coronavirus HKU1 NOT DETECTED NOT DETECTED Final   Coronavirus NL63 NOT DETECTED NOT DETECTED Final   Coronavirus OC43 NOT DETECTED NOT DETECTED Final   Metapneumovirus NOT DETECTED NOT DETECTED Final   Rhinovirus / Enterovirus NOT DETECTED NOT DETECTED Final   Influenza A NOT DETECTED NOT DETECTED Final    Influenza B NOT DETECTED NOT DETECTED Final   Parainfluenza Virus 1 NOT DETECTED NOT DETECTED Final   Parainfluenza Virus 2 NOT DETECTED NOT DETECTED Final   Parainfluenza Virus 3 NOT DETECTED NOT DETECTED Final   Parainfluenza Virus 4 NOT DETECTED NOT DETECTED Final   Respiratory Syncytial Virus NOT DETECTED NOT DETECTED Final   Bordetella pertussis NOT DETECTED NOT DETECTED Final   Chlamydophila pneumoniae NOT DETECTED NOT DETECTED Final   Mycoplasma pneumoniae NOT DETECTED NOT DETECTED Final    Comment: Performed at [facility], [address] [place], [place] [address]  MRSA PCR Screening     Status: None   Collection Time: 08/10/19  2:11 PM   Specimen: Nasal Mucosa; Nasopharyngeal  Result Value Ref Range Status   MRSA by PCR NEGATIVE NEGATIVE Final    Comment:        The GeneXpert MRSA Assay (FDA approved for NASAL specimens only), is one component of a comprehensive MRSA colonization surveillance program. It is not intended to diagnose MRSA infection nor to guide or monitor treatment for MRSA infections. Performed at [facility], [address], [place], [place] [address]    Time spent: 30 min  SIGNED:   Rickey Barbara, MD  [facility] 08/13/2019, 4:26 PM  If 7PM-7AM, please contact night-coverage

## 2019-08-13 NOTE — TOC Transition Note (Signed)
Transition of Care [facility]) - CM/SW Discharge Note   Patient Details  Name: [name] MRN: [id number] Date of Birth: 21-Jun-1956  [facility] CM/SW Contact:  Carles Collet, RN Phone Number: 08/13/2019, 9:43 AM   Clinical Narrative:    See Tomi Bamberger RN CM note re [place].  DME has been requested to be delivered to the room.   No other CM needs identified at this time.          Patient Goals and CMS Choice        Discharge Placement                       Discharge Plan and Services                DME Arranged: Oxygen, Wheelchair manual, Nebulizer machine DME Agency: [employer] Date DME Agency Contacted: 08/13/19 Time DME Agency Contacted: [phone] Representative spoke with at DME Agency: [place]            Social Determinants of Health ([place]) Interventions     Readmission Risk Interventions No flowsheet data found.

## 2019-08-13 NOTE — Progress Notes (Signed)
DME delivered to pt's room. Pt's stable, dc home via wheelchair with oxygen.

## 2019-08-13 NOTE — Care Management (Signed)
    Durable Medical Equipment  (From admission, onward)         Start     Ordered   08/13/19 0907  For home use only DME Nebulizer machine  Once    Question Answer Comment  Patient needs a nebulizer to treat with the following condition ILD (interstitial lung disease) ([place])   Length of Need Lifetime      08/13/19 0907   08/13/19 0907  For home use only DME oxygen  Once    Question Answer Comment  Length of Need Lifetime   Mode or (Route) Nasal cannula   Liters per Minute 4   Oxygen delivery system Gas      08/13/19 0907   08/13/19 0907  For home use only DME standard manual wheelchair with seat cushion  Once    Comments: Patient suffers from ILD which impairs their ability to perform daily activities like bathing in the home.  A cane will not resolve issue with performing activities of daily living. A wheelchair will allow patient to safely perform daily activities. Patient can safely propel the wheelchair in the home or has a caregiver who can provide assistance. Length of need Lifetime. Accessories: elevating leg rests (ELRs), wheel locks, extensions and anti-tippers.   08/13/19 6811

## 2019-08-16 ENCOUNTER — Other Ambulatory Visit
Admission: RE | Admit: 2019-08-16 | Discharge: 2019-08-16 | Disposition: A | Discharge status: Home / Self Care | Payer: Medicaid Other | Source: Ambulatory Visit | Attending: Internal Medicine | Admitting: Internal Medicine

## 2019-08-16 ENCOUNTER — Ambulatory Visit: Payer: Medicaid Other | Admitting: Internal Medicine

## 2019-08-16 ENCOUNTER — Ambulatory Visit (INDEPENDENT_AMBULATORY_CARE_PROVIDER_SITE_OTHER): Payer: Medicaid Other | Admitting: Primary Care

## 2019-08-16 ENCOUNTER — Encounter: Payer: Medicaid Other | Attending: Internal Medicine | Admitting: Internal Medicine

## 2019-08-16 ENCOUNTER — Other Ambulatory Visit: Payer: Self-pay

## 2019-08-16 DIAGNOSIS — I110 Hypertensive heart disease with heart failure: Secondary | ICD-10-CM | POA: Insufficient documentation

## 2019-08-16 DIAGNOSIS — I2510 Atherosclerotic heart disease of native coronary artery without angina pectoris: Secondary | ICD-10-CM | POA: Insufficient documentation

## 2019-08-16 DIAGNOSIS — Z7952 Long term (current) use of systemic steroids: Secondary | ICD-10-CM | POA: Insufficient documentation

## 2019-08-16 DIAGNOSIS — Z955 Presence of coronary angioplasty implant and graft: Secondary | ICD-10-CM | POA: Diagnosis not present

## 2019-08-16 DIAGNOSIS — M8580 Other specified disorders of bone density and structure, unspecified site: Secondary | ICD-10-CM | POA: Insufficient documentation

## 2019-08-16 DIAGNOSIS — I5042 Chronic combined systolic (congestive) and diastolic (congestive) heart failure: Secondary | ICD-10-CM | POA: Insufficient documentation

## 2019-08-16 DIAGNOSIS — E43 Unspecified severe protein-calorie malnutrition: Secondary | ICD-10-CM | POA: Insufficient documentation

## 2019-08-16 DIAGNOSIS — J849 Interstitial pulmonary disease, unspecified: Secondary | ICD-10-CM | POA: Diagnosis not present

## 2019-08-16 DIAGNOSIS — M069 Rheumatoid arthritis, unspecified: Secondary | ICD-10-CM | POA: Diagnosis not present

## 2019-08-16 DIAGNOSIS — I255 Ischemic cardiomyopathy: Secondary | ICD-10-CM | POA: Diagnosis not present

## 2019-08-16 DIAGNOSIS — L89314 Pressure ulcer of right buttock, stage 4: Secondary | ICD-10-CM | POA: Diagnosis not present

## 2019-08-16 DIAGNOSIS — Z8673 Personal history of transient ischemic attack (TIA), and cerebral infarction without residual deficits: Secondary | ICD-10-CM | POA: Insufficient documentation

## 2019-08-16 DIAGNOSIS — I252 Old myocardial infarction: Secondary | ICD-10-CM | POA: Diagnosis not present

## 2019-08-16 DIAGNOSIS — B999 Unspecified infectious disease: Secondary | ICD-10-CM | POA: Insufficient documentation

## 2019-08-17 ENCOUNTER — Ambulatory Visit: Payer: Medicaid Other | Admitting: Internal Medicine

## 2019-08-17 NOTE — Progress Notes (Signed)
UNKNOWN, MANGANELLI ([id number]) Visit Report for 08/16/2019 Arrival Information Details Patient Name: [name], [name] Date of Service: 08/16/2019 10:15 AM Medical Record Number: [id number] Patient Account Number: [id number] Date of Birth/Sex: 03/25/1956 (63 y.o. M) Treating RN: Arnette Norris Primary Care Rocklin Soderquist: Sindy Messing Other Clinician: Referring Tran Randle: Sindy Messing Treating Jaimes Eckert/Extender: Altamese [place] in Treatment: 11 Visit Information History Since Last Visit Added or deleted any medications: No Patient Arrived: Wheel Chair Any new allergies or adverse reactions: No Arrival Time: 10:24 Had a fall or experienced change in No Accompanied By: sister-Cathy activities of daily living that may affect Transfer Assistance: Manual risk of falls: Patient Identification Verified: Yes Signs or symptoms of abuse/neglect since last visito No Secondary Verification Process Completed: Yes Hospitalized since last visit: No Has Dressing in Place as Prescribed: Yes Pain Present Now: Yes Electronic Signature(s) Signed: 08/16/2019 4:37:37 PM By: Arnette Norris Entered By: Arnette Norris on 08/16/2019 10:26:07 [name] ([id number]) -------------------------------------------------------------------------------- Clinic Level of Care Assessment Details Patient Name: [name] Date of Service: 08/16/2019 10:15 AM Medical Record Number: [id number] Patient Account Number: [id number] Date of Birth/Sex: [birth year] (63 y.o. M) Treating RN: Huel Coventry Primary Care Calvin Chura: Sindy Messing Other Clinician: Referring Austyn Perriello: Sindy Messing Treating Casimira Sutphin/Extender: Altamese  in Treatment: 11 Clinic Level of Care Assessment Items TOOL 4 Quantity Score []  - Use when only an EandM is performed on FOLLOW-UP visit 0 ASSESSMENTS - Nursing Assessment / Reassessment X - Reassessment of Co-morbidities (includes updates in patient status) 1 10 X- 1  5 Reassessment of Adherence to Treatment Plan ASSESSMENTS - Wound and Skin Assessment / Reassessment X - Simple Wound Assessment / Reassessment - one wound 1 5 []  - 0 Complex Wound Assessment / Reassessment - multiple wounds []  - 0 Dermatologic / Skin Assessment (not related to wound area) ASSESSMENTS - Focused Assessment []  - Circumferential Edema Measurements - multi extremities 0 []  - 0 Nutritional Assessment / Counseling / Intervention []  - 0 Lower Extremity Assessment (monofilament, tuning fork, pulses) []  - 0 Peripheral Arterial Disease Assessment (using hand held doppler) ASSESSMENTS - Ostomy and/or Continence Assessment and Care []  - Incontinence Assessment and Management 0 []  - 0 Ostomy Care Assessment and Management (repouching, etc.) PROCESS - Coordination of Care X - Simple Patient / Family Education for ongoing care 1 15 []  - 0 Complex (extensive) Patient / Family Education for ongoing care X- 1 10 Staff obtains Chiropractor, Records, Test Results / Process Orders []  - 0 Staff telephones HHA, Nursing Homes / Clarify orders / etc []  - 0 Routine Transfer to another Facility (non-emergent condition) []  - 0 Routine Hospital Admission (non-emergent condition) []  - 0 New Admissions / Manufacturing engineer / Ordering NPWT, Apligraf, etc. []  - 0 Emergency Hospital Admission (emergent condition) X- 1 10 Simple Discharge Coordination MILO, CAPEL. ([id number]) []  - 0 Complex (extensive) Discharge Coordination PROCESS - Special Needs []  - Pediatric / Minor Patient Management 0 []  - 0 Isolation Patient Management []  - 0 Hearing / Language / Visual special needs []  - 0 Assessment of Community assistance (transportation, D/C planning, etc.) []  - 0 Additional assistance / Altered mentation []  - 0 Support Surface(s) Assessment (bed, cushion, seat, etc.) INTERVENTIONS - Wound Cleansing / Measurement X - Simple Wound Cleansing - one wound 1 5 []  - 0 Complex Wound  Cleansing - multiple wounds X- 1 5 Wound Imaging (photographs - any number of wounds) []  - 0 Wound Tracing (instead of photographs) X- 1 5 Simple  Wound Measurement - one wound []  - 0 Complex Wound Measurement - multiple wounds INTERVENTIONS - Wound Dressings []  - Small Wound Dressing one or multiple wounds 0 X- 1 15 Medium Wound Dressing one or multiple wounds []  - 0 Large Wound Dressing one or multiple wounds []  - 0 Application of Medications - topical []  - 0 Application of Medications - injection INTERVENTIONS - Miscellaneous []  - External ear exam 0 []  - 0 Specimen Collection (cultures, biopsies, blood, body fluids, etc.) []  - 0 Specimen(s) / Culture(s) sent or taken to Lab for analysis []  - 0 Patient Transfer (multiple staff / Nurse, adultHoyer Lift / Similar devices) []  - 0 Simple Staple / Suture removal (25 or less) []  - 0 Complex Staple / Suture removal (26 or more) []  - 0 Hypo / Hyperglycemic Management (close monitor of Blood Glucose) []  - 0 Ankle / Brachial Index (ABI) - do not check if billed separately X- 1 5 Vital Signs Scarola, Remmy W. ([id number]) Has the patient been seen at the hospital within the last three years: Yes Total Score: 90 Level Of Care: New/Established - Level 3 Electronic Signature(s) Signed: 08/16/2019 1:48:26 PM By: Elliot GurneyWoody, BSN, RN, CWS, Kim RN, BSN Entered By: Elliot GurneyWoody, BSN, RN, CWS, Kim on 08/16/2019 11:08:55 Ryan CarrowMOORE, Edilson W. ([id number]) -------------------------------------------------------------------------------- Encounter Discharge Information Details Patient Name: Ryan CarrowMOORE, [name] Date of Service: 08/16/2019 10:15 AM Medical Record Number: [id number] Patient Account Number: [id number] Date of Birth/Sex: August 18, 1956 (63 y.o. M) Treating RN: Huel CoventryWoody, Kim Primary Care Cejay Cambre: Sindy MessingGOMEZ, ROGER Other Clinician: Referring Stryder Poitra: Sindy MessingGOMEZ, ROGER Treating Latasha Buczkowski/Extender: Altamese CarolinaOBSON, MICHAEL G Weeks in Treatment: 11 Encounter Discharge Information  Items Discharge Condition: Stable Ambulatory Status: Ambulatory Discharge Destination: Home Transportation: Private Auto Accompanied By: self Schedule Follow-up Appointment: Yes Clinical Summary of Care: Electronic Signature(s) Signed: 08/16/2019 1:48:26 PM By: Elliot GurneyWoody, BSN, RN, CWS, Kim RN, BSN Entered By: Elliot GurneyWoody, BSN, RN, CWS, Kim on 08/16/2019 11:10:25 Ryan CarrowMOORE, Silvia W. ([id number]) -------------------------------------------------------------------------------- Lower Extremity Assessment Details Patient Name: Ryan CarrowMOORE, Daylan W. Date of Service: 08/16/2019 10:15 AM Medical Record Number: [id number] Patient Account Number: [id number] Date of Birth/Sex: August 18, 1956 (63 y.o. M) Treating RN: Arnette NorrisBiell, Kristina Primary Care Johneisha Broaden: Sindy MessingGOMEZ, ROGER Other Clinician: Referring Mitsuo Budnick: Sindy MessingGOMEZ, ROGER Treating Alandria Butkiewicz/Extender: Maxwell CaulOBSON, MICHAEL G Weeks in Treatment: 11 Electronic Signature(s) Signed: 08/16/2019 4:37:37 PM By: Arnette NorrisBiell, Kristina Entered By: Arnette NorrisBiell, Kristina on 08/16/2019 10:35:38 Merced, Sender W. ([id number]) -------------------------------------------------------------------------------- Multi Wound Chart Details Patient Name: Ryan CarrowMOORE, Natale W. Date of Service: 08/16/2019 10:15 AM Medical Record Number: [id number] Patient Account Number: [id number] Date of Birth/Sex: August 18, 1956 (63 y.o. M) Treating RN: Huel CoventryWoody, Kim Primary Care Otisha Spickler: Sindy MessingGOMEZ, ROGER Other Clinician: Referring Paola Aleshire: Sindy MessingGOMEZ, ROGER Treating Farryn Linares/Extender: Altamese CarolinaOBSON, MICHAEL G Weeks in Treatment: 11 Vital Signs Height(in): 73 Pulse(bpm): 94 Weight(lbs): 155 Blood Pressure(mmHg): 102/45 Body Mass Index(BMI): 20 Temperature(F): 98.2 Respiratory Rate 18 (breaths/min): Photos: [N/A:N/A] Wound Location: Right Gluteus N/A N/A Wounding Event: Pressure Injury N/A N/A Primary Etiology: Pressure Ulcer N/A N/A Comorbid History: Cataracts, Congestive Heart N/A N/A Failure, Coronary Artery Disease,  Hypertension, Myocardial Infarction, History of pressure wounds, Rheumatoid Arthritis Date Acquired: 05/03/2019 N/A N/A Weeks of Treatment: 11 N/A N/A Wound Status: Open N/A N/A Measurements L x W x D 2.5x1.9x0.9 N/A N/A (cm) Area (cm) : 3.731 N/A N/A Volume (cm) : 3.358 N/A N/A % Reduction in Area: -692.10% N/A N/A % Reduction in Volume: -3472.30% N/A N/A Starting Position 1 11 (o'clock): Ending Position 1 7 (o'clock): Maximum Distance 1 (cm): 1.6 Undermining: Yes N/A N/A Classification: Category/Stage IV N/A N/A Exudate  Amount: Medium N/A N/A Exudate Type: Serous N/A N/A Exudate Color: amber N/A N/A Wound Margin: Epibole N/A N/A LUISALBERTO, BEEGLE. ([id number]) Granulation Amount: Large (67-100%) N/A N/A Granulation Quality: Pink N/A N/A Necrotic Amount: Small (1-33%) N/A N/A Exposed Structures: Fat Layer (Subcutaneous N/A N/A Tissue) Exposed: Yes Bone: Yes Fascia: No Tendon: No Muscle: No Joint: No Epithelialization: Small (1-33%) N/A N/A Treatment Notes Wound #2 (Right Gluteus) Notes silver cell, packed lightly into wound, BFD Electronic Signature(s) Signed: 08/16/2019 5:10:21 PM By: Linton Ham MD Entered By: Linton Ham on 08/16/2019 11:27:07 Diona Fanti ([id number]) -------------------------------------------------------------------------------- Jarratt Details Patient Name: AGUSTUS, MANE. Date of Service: 08/16/2019 10:15 AM Medical Record Number: [id number] Patient Account Number: [id number] Date of Birth/Sex: January 23, 1956 (62 y.o. M) Treating RN: Cornell Barman Primary Care Sundai Probert: Domenica Fail Other Clinician: Referring Chatara Lucente: Domenica Fail Treating Tiffany Calmes/Extender: Tito Dine in Treatment: 11 Active Inactive Pressure Nursing Diagnoses: Knowledge deficit related to causes and risk factors for pressure ulcer development Knowledge deficit related to management of pressures  ulcers Goals: Patient/caregiver will verbalize risk factors for pressure ulcer development Date Initiated: 05/31/2019 Target Resolution Date: 07/01/2019 Goal Status: Active Interventions: Assess: immobility, friction, shearing, incontinence upon admission and as needed Assess offloading mechanisms upon admission and as needed Assess potential for pressure ulcer upon admission and as needed Provide education on pressure ulcers Notes: Wound/Skin Impairment Nursing Diagnoses: Impaired tissue integrity Goals: Ulcer/skin breakdown will have a volume reduction of 30% by week 4 Date Initiated: 05/31/2019 Target Resolution Date: 07/01/2019 Goal Status: Active Interventions: Assess patient/caregiver ability to perform ulcer/skin care regimen upon admission and as needed Assess ulceration(s) every visit Notes: Electronic Signature(s) Signed: 08/16/2019 1:48:26 PM By: Gretta Cool, BSN, RN, CWS, Kim RN, BSN Entered By: Gretta Cool, BSN, RN, CWS, Kim on 08/16/2019 10:52:51 Staup, Holley Dexter ([id number]) -------------------------------------------------------------------------------- Pain Assessment Details Patient Name: KEENEN, ROESSNER. Date of Service: 08/16/2019 10:15 AM Medical Record Number: [id number] Patient Account Number: [id number] Date of Birth/Sex: 02/22/1956 (62 y.o. M) Treating RN: Harold Barban Primary Care Takoda Janowiak: Domenica Fail Other Clinician: Referring Jozef Eisenbeis: Domenica Fail Treating [employer] Null/Extender: Tito Dine in Treatment: 11 Active Problems Location of Pain Severity and Description of Pain Patient Has Paino Yes Site Locations Rate the pain. Current Pain Level: 6 Character of Pain Describe the Pain: Tender Pain Management and Medication Current Pain Management: Electronic Signature(s) Signed: 08/16/2019 4:37:37 PM By: Harold Barban Entered By: Harold Barban on 08/16/2019 10:26:33 Asbill, Holley Dexter  ([id number]) -------------------------------------------------------------------------------- Patient/Caregiver Education Details Patient Name: Diona Fanti. Date of Service: 08/16/2019 10:15 AM Medical Record Number: [id number] Patient Account Number: [id number] Date of Birth/Gender: 04/27/1956 (62 y.o. M) Treating RN: Cornell Barman Primary Care Physician: Domenica Fail Other Clinician: Referring Physician: Domenica Fail Treating Physician/Extender: Tito Dine in Treatment: 11 Education Assessment Education Provided To: Patient and Caregiver Education Topics Provided Offloading: Handouts: What is Offloadingo Methods: Demonstration, Explain/Verbal Responses: Return demonstration correctly, State content correctly Pressure: Handouts: Pressure Ulcers: Care and Offloading Methods: Demonstration, Explain/Verbal Responses: Return demonstration correctly Wound/Skin Impairment: Handouts: Caring for Your Ulcer Methods: Demonstration, Explain/Verbal Responses: State content correctly Electronic Signature(s) Signed: 08/16/2019 1:48:26 PM By: Gretta Cool, BSN, RN, CWS, Kim RN, BSN Entered By: Gretta Cool, BSN, RN, CWS, Kim on 08/16/2019 11:09:35 Diona Fanti ([id number]) -------------------------------------------------------------------------------- Wound Assessment Details Patient Name: Diona Fanti. Date of Service: 08/16/2019 10:15 AM Medical Record Number: [id number] Patient Account Number: [id number] Date of Birth/Sex: 1956/10/03 (62 y.o. M) Treating RN: Harold Barban Primary Care Catlyn Shipton: Domenica Fail  Other Clinician: Referring Aleiya Rye: GOMEZ, ROGER Treating Hadessah Grennan/Extender: Maxwell Caul Weeks in Treatment: 11 Wound Status Wound Number: 2 Primary Pressure Ulcer Etiology: Wound Location: Right Gluteus Wound Open Wounding Event: Pressure Injury Status: Date Acquired: 05/03/2019 Comorbid Cataracts, Congestive Heart Failure, Coronary Weeks Of Treatment:  11 History: Artery Disease, Hypertension, Myocardial Clustered Wound: No Infarction, History of pressure wounds, Rheumatoid Arthritis Photos Wound Measurements Length: (cm) 2.5 % Reduction Width: (cm) 1.9 % Reduction Depth: (cm) 0.9 Epithelializ Area: (cm) 3.731 Undermining Volume: (cm) 3.358 Starting Ending Po Maximum D in Area: -692.1% in Volume: -3472.3% ation: Small (1-33%) : Yes Position (o'clock): 11 sition (o'clock): 7 istance: (cm) 1.6 Wound Description Classification: Category/Stage IV Foul Odor Af Wound Margin: Epibole Slough/Fibri Exudate Amount: Medium Exudate Type: Serous Exudate Color: amber ter Cleansing: No no No Wound Bed Granulation Amount: Large (67-100%) Exposed Structure Granulation Quality: Pink Fascia Exposed: No Necrotic Amount: Small (1-33%) Fat Layer (Subcutaneous Tissue) Exposed: Yes Necrotic Quality: Adherent Slough Tendon Exposed: No Muscle Exposed: No Joint Exposed: No Vanvoorhis, Coty W. ([id number]) Bone Exposed: Yes Treatment Notes Wound #2 (Right Gluteus) Notes silver cell, packed lightly into wound, BFD Electronic Signature(s) Signed: 08/16/2019 11:18:57 AM By: Elliot Gurney, BSN, RN, CWS, Kim RN, BSN Signed: 08/16/2019 4:37:37 PM By: Arnette Norris Entered By: Elliot Gurney, BSN, RN, CWS, Kim on 08/16/2019 11:18:56 Granderson, Xzaiver W. ([id number]) -------------------------------------------------------------------------------- Vitals Details Patient Name: JALIN, ALICEA. Date of Service: 08/16/2019 10:15 AM Medical Record Number: [id number] Patient Account Number: [id number] Date of Birth/Sex: [birth year] (62 y.o. M) Treating RN: Arnette Norris Primary Care Satcha Storlie: Sindy Messing Other Clinician: Referring Macdonald Rigor: Sindy Messing Treating Samin Milke/Extender: Altamese  in Treatment: 11 Vital Signs Time Taken: 10:25 Temperature (F): 98.2 Height (in): 73 Pulse (bpm): 94 Weight (lbs): 155 Respiratory Rate (breaths/min):  18 Body Mass Index (BMI): 20.4 Blood Pressure (mmHg): 102/45 Reference Range: 80 - 120 mg / dl Electronic Signature(s) Signed: 08/16/2019 4:37:37 PM By: Arnette Norris Entered By: Arnette Norris on 08/16/2019 10:27:03

## 2019-08-17 NOTE — Progress Notes (Signed)
KEYMARI, SATO ([id number]) Visit Report for 08/16/2019 HPI Details Patient Name: [name], [name] Date of Service: 08/16/2019 10:15 AM Medical Record Number: [id number] Patient Account Number: [id number] Date of Birth/Sex: July 29, 1956 (62 y.o. M) Treating RN: Huel Coventry Primary Care Provider: Sindy Messing Other Clinician: Referring Provider: Sindy Messing Treating Provider/Extender: Altamese [place] in Treatment: 11 History of Present Illness HPI Description: ADMISSION 06/01/18 This is a 63 year old man who is sent to [place] currently residing at [facility] skilled facility. He has a complicated recent medical history. His sister accompanies him tells [place] that he has been somewhat disabled since suffering a left basal ganglial CVA in 2018. At that point in time he also had myocardial infarctions. More recently he has been hospitalized from 04/04/18 through 04/12/18 with septic shock, coronary artery disease, congestive heart failure and an acute CVA. During this hospitalization he was noted to have sacral wounds but these were not felt to be the source of infection however the source of the infection was never really determined at that point. I believe he was sent to a skilled facility. He reports that at the hospital on 05/09/18 through 05/18/18. At that point he was noted to have a large abscess of the right buttock. He required a surgical IandD by Dr. Gerrit Friends of general surgery. Culture of this grew Citrobacter,Eikenella and Bacteroides. He had a CT scan of the pelvis that showed an abnormal soft tissue collection just caudal to the right ischial tuberosity measuring 5.9 and by 4.7. This also involved the distal right deep muscles. There was some cortical bone loss on the right ischial tuberosity and there was some concern with osteomyelitis. He was felt to require treatment for osteomyelitis based on clinical and radiographic findings. He did not have an MRI. Past medical history  includes; abdominal aortic aneurysm, carotid stenosis, hypertension, osteoarthritis, interstitial lung disease on chronic steroids, rheumatoid arthritis, congestive heart failure/ischemic cardiomyopathy with an ejection fraction of 35-40% Also noteworthy is on 05/09/18 his albumin was 2.2 on admission to hospital. I don't think this is actually been repeated. The patient states his appetite is good and he is taking supplements 100%. 06/15/18-He is seen in follow up vibration for right buttock ulcer; this is stable in appearance with minimal amount of non-viable tissue. 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 We've been using a wound VAC. This is centered over the right ischial tuberosity. He is making generally good progress. He was severely hypoalbuminemic at one time although he states that he eats well and takes his supplements. 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 We have been using silver collagen over the superior aspect of the right ischial tuberosity under wound VAC.he has made some nice improvements. The dimensions of the wound are certainly smaller. He has no exposed bone. We received lab work from [facility]. His prealbumin was 18.6 and albumin at 2.57. This is obviously still low. I am not sure what interventions are in place. I know he is on supplements. I discussed this with the patient. I told him that the albumin represents already moderate to severe protein malnutrition. This would contribute it to do difficulty healing the wound 07/20/18; I received copies of lab work from [facility] on 07/06/18 documenting a sedimentation rate of 25 which seems to be in line with what I see  from previous values however his C-reactive protein was 110.8 versus 84.7 on 06/22/18. I have looked through [facility] health  Link I don't see a prior value for either inflammatory measure. he has been treated with [name] which I think he is still on for osteomyelitis of the right ischial tuberosity as well as a soft tissue collection just caudad to the right ischial tuberosity itself. Felt to have a soft tissue abscess and/or stigmata of necrotizing fasciitis. I'm not sure who is following this. His CT scan was on 05/09/18 yet he still remains on Invanz. Looking back through the original discharge summary the [name] was supposed to finish on 07/05/18. 08/03/18 the patient continues to make good improvement. The area over the right ischial tuberosity has filled in nicely. His developed some what looks to be candidal skin infection in the periwound. I think they've taken the wound VAC off and are HAKOP, KIRT. ([id number]) applying nystatin 08/17/18; 2 week follow-up for this resident from [facility]. They resumed the wound VAC. The area continues to fill in nicely. The only problem area is the tissue over the ischial tuberosity itself which needs to come up a bit. For this reason I'm continuing the wound VAC. Otherwise this is doing remarkably well. The patient states that he is eating everything they give him although I don't see a follow-up albumin. 08/31/18; the patient states he had blood work done today. I do not have these results. He claims to be eating well. He continues with a wound VAC and the wound is making nice progress. 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 This is coming quite nicely he still has one Onalee Hua that has some depth but I think by the next time we see him in 2 weeks we should be able to move on from the wound VAC. He is still at skilled care at [facility] place. As far as I am aware he is eating and drinking well. I  have not seen follow-up albumin studies on him recently but the message I'm getting is that he is eating well and taking his supplements. The patient states he is not very mobile and I am hopeful that we are able to insure pressure relief here however if we hadn't been ensuring pressure relief I doubt this would've closed in as well as it has even with the wound VAC. He does not complain of any systemic issues 09/28/2018 for follow-up and management of stage IV sacral ulcer that is being managed with a wound VAC. He is a resident at a long-term care facility, [facility]. The wound continues to have some depth and width some granulation of tissue on the outer borders of the wound. The wound was tender to touch today during exam. He denies any pain to the wound when it is not being touched. Recent fevers, chills, nausea, vomiting, or shortness of breath. Hopeful over the next couple of weeks he will be able to transition from the wound VAC. 10/19/2018; this patient has a stage IV over the right ischial tuberosity. The wound is come to the surface of the skin and looks very healthy at this point. His wound VAC can be discontinued. The patient apparently is eating well taking his supplements. He has no systemic complaints 11/23/2018. The patient has a stage IV wound over  his right ischial tuberosity that is now down to a quarter sized superficial area. There is no depth to this. The tissue looks healthy. No debridement was required. Continued improvement. We are using silver alginate and bordered foam 2/19; the original stage IV wound over his right ischial tuberosity. Continues to make nice progress in terms of dimensions. There is no depth to this. We have been using silver alginate and border foam 3/11; this is in follow-up of his original stage IV wound over his right ischial tuberosity. This continues to make nice progress using silver alginate and border foam vigorous offloading. He is at [facility] skilled facility. Patient complains of terrible pain in his hands and having trouble getting pain medication which he has to ask for as needed. He apparently has a history of rheumatoid arthritis and I note he is on prednisone and chloroquine. His rheumatologist is at [facility] clinic. 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 This continues to make decent progress is smaller. There is some undermining at roughly 12-3 o'clock. We have been using silver alginate 4/29; patient's wound continues to get smaller. The remaining area is epithelializing. The undermining area still is not epithelialized however. We have been using silver collagen since the last time he is here. He does not come any notes from the facility where he lives [facility]] but he claims to be eating well and vigorously offloading this area 5/20-Patient returns to clinic for right ischial wound. No notes available from facility, wound appears to be improving, we have been using Prisma and he has been offloading this area while using his wheelchair in fact he has started to use the walker again READMISSION 05/31/2019 This is a patient we cared for her for a prolonged period of time with a stage IV pressure area over his right ischio tuberosity. 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 We were able to get this to markedly improve with a wound VAC over time. We last saw this patient in clinic on 5/20 we are using silver collagen and a small wound that was just about closed. Per the patient this wound actually did progress to closure although I do not have any independent verification of this from [facility] where he lives. He states that this reopened about 2 to 3 months ago. His timeframe is off however again I do not have any independent verification. He has a small wound with a lip lip of tissue over the top of  this 8/26; wound is down to a very tiny open area about slight hyper granulation. The area is healing with epithelialization and a bit of a divot. I used silver nitrate. Continue with Hydrofera Blue 9/23; patient arrives today with the wound quite a bit worse. There is a lip of skin and subcutaneous tissue over the majority of the visible part of this wound however this is fairly deep underneath this with exposed bone. At one point he had a large stage IV wound and this with underlying osteomyelitis. We have been using Hydrofera Blue. HOWELL, GROESBECK. ([id number]) Also, the patient is being discharged in 8 days to home to live with his sister in [place]. There will be a lot of arrangements here. I counseled the patient that he is absolutely going to have to be rigorous and offloading this area. I change the primary dressing to silver alginate  10/14; this is a patient with a prior history of a stage IV pressure ulcer over the right ischial tuberosity. He was treated with 6 weeks worth of IV antibiotics and he was at [facility] skilled facility. This almost closed although the last time he was here there was some more depth and probing to bone. Since then on 10/1 he was discharged to home where he is being cared for by his sister. He was hospitalized from 08/07/2019 through 08/13/2019 with acute on chronic respiratory failure secondary to known interstitial lung disease and chronic combined congestive heart failure. His echocardiogram showed an ejection fraction of 20 to 25% he had PCI done with a stent of the right coronary artery. He is returned home and his sister is with him today. She reports that the wound is a lot worse since his hospitalization. There is clearly large areas of exposed bone in the area is a lot larger some tenderness they have been using silver alginate. He apparently is eating better. He is not incontinent able to get up and transfer himself. He has a recliner but he is  sleeping in the bed Electronic Signature(s) Signed: 08/16/2019 5:10:21 PM By: Baltazar Najjar MD Entered By: Baltazar Najjar on 08/16/2019 11:30:43 Osborn, [name] ([id number]) -------------------------------------------------------------------------------- Physical Exam Details Patient Name: TOMMIE, BOHLKEN. Date of Service: 08/16/2019 10:15 AM Medical Record Number: [id number] Patient Account Number: [id number] Date of Birth/Sex: August 08, 1956 (62 y.o. M) Treating RN: Huel Coventry Primary Care Provider: Sindy Messing Other Clinician: Referring Provider: Sindy Messing Treating Provider/Extender: Maxwell Caul Weeks in Treatment: 11 Constitutional Sitting or standing Blood Pressure is within target range for patient.[name] Respirations regular, non-labored and within target range.O2. Temperature is normal and within the target range for the patient.[name] He is not in any distress somewhat more pale than I am used to seeing O2 1 bibasilar Craig up bibasilar crackles bibasilar crackles. Respiratory Respiratory effort is easy and symmetric bilaterally. Rate is normal at rest and on room air.. Crackles bilaterally at the bases. Cardiovascular Heart sounds distant JVP not elevated. Notes Wound exam; the wound is considerably larger than last time. There is clearly exposed bone. Some tenderness around the wound. No surrounding erythema no purulent drainage. Electronic Signature(s) Signed: 08/16/2019 5:10:21 PM By: Baltazar Najjar MD Entered By: Baltazar Najjar on 08/16/2019 11:37:52 Ferreras, [name] ([id number]) -------------------------------------------------------------------------------- Physician Orders Details Patient Name: DMARION, PERFECT. Date of Service: 08/16/2019 10:15 AM Medical Record Number: [id number] Patient Account Number: [id number] Date of Birth/Sex: Jun 10, 1956 (62 y.o. M) Treating RN: Huel Coventry Primary Care Provider: Sindy Messing Other Clinician: Referring Provider: Sindy Messing Treating Provider/Extender: Altamese [place] in Treatment: 11 Verbal / Phone Orders: No Diagnosis Coding Wound Cleansing Wound #2 Right Gluteus o Clean wound with Normal Saline. o Cleanse wound with mild soap and water Anesthetic (add to Medication List) Wound #2 Right Gluteus o Topical Lidocaine 4% cream applied to wound bed prior to debridement (In Clinic Only). Skin Barriers/Peri-Wound Care Wound #2 Right Gluteus o Skin Prep - to peri-wound Primary Wound Dressing Wound #2 Right Gluteus o Silver Alginate - packed lighty Secondary Dressing Wound #2 Right Gluteus o Boardered Foam Dressing Dressing Change Frequency Wound #2 Right Gluteus o Change dressing every day. - Monday, Wednesday, Friday by home health. May skip Wednesday if patient has wound care visit. Homehealth, please train patient's sister for additional days. Follow-up Appointments Wound #2 Right Gluteus o Return Appointment in 2 weeks. Off-Loading Wound #2 Right Gluteus o Mattress -  Order bed/air mattress-please notify wound care center [phone] if we need to order bed and mattress for the patient. o Turn and reposition every 2 hours Home Health Wound #2 Right [place] Visits - [facility] bed with air mattress. If wound care needs to order bed and mattress, please let us know [phone]. o Home Health Nurse may visit PRN to address patientos wound care needs. JAMARI, MOTEN ([id number]) o FACE TO FACE ENCOUNTER: MEDICARE and MEDICAID PATIENTS: I certify that this patient is under my care and that I had a face-to-face encounter that meets the physician face-to-face encounter requirements with this patient on this date. The encounter with the patient was in whole or in part for the following MEDICAL CONDITION: (primary reason for [employer]) MEDICAL NECESSITY: I certify, that based on my findings, NURSING services  are a medically necessary home health service. HOME BOUND STATUS: I certify that my clinical findings support that this patient is homebound (i.e., Due to illness or injury, pt requires aid of supportive devices such as crutches, cane, wheelchairs, walkers, the use of special transportation or the assistance of another person to leave their place of residence. There is a normal inability to leave the home and doing so requires considerable and taxing effort. Other absences are for medical reasons / religious services and are infrequent or of short duration when for other reasons). o If current dressing causes regression in wound condition, may D/C ordered dressing product/s and apply Normal Saline Moist Dressing daily until next [facility] / Other MD appointment. [place] of regression in wound condition at [phone]. o Please direct any NON-WOUND related issues/requests for orders to patient's Primary Care Physician Medications-please add to medication list. Wound #2 Right Gluteus o Other: - Nutritional supplement daily Devices o Bed- Mattress-Overlay or Replacement - Level 2 Laboratory o Bacteria identified in Wound by Culture (MICRO) - right gluteal oooo LOINC Code: 9326-7 oooo Convenience Name: Wound culture routine Electronic Signature(s) Signed: 08/16/2019 1:48:26 PM By: Gretta Cool, BSN, RN, CWS, Kim RN, BSN Signed: 08/16/2019 5:10:21 PM By: Linton Ham MD Entered By: Gretta Cool, BSN, RN, CWS, Kim on 08/16/2019 11:27:10 KINSLEY, NICKLAUS ([id number]) -------------------------------------------------------------------------------- Problem List Details Patient Name: BLAKE, VETRANO. Date of Service: 08/16/2019 10:15 AM Medical Record Number: [id number] Patient Account Number: [id number] Date of Birth/Sex: Jul 09, 1956 (62 y.o. M) Treating RN: Cornell Barman Primary Care Provider: Domenica Fail Other Clinician: Referring Provider: Domenica Fail Treating  Provider/Extender: Tito Dine in Treatment: 11 Active Problems ICD-10 Evaluated Encounter Code Description Active Date Today Diagnosis L89.314 Pressure ulcer of right buttock, stage 4 05/31/2019 No Yes Inactive Problems Resolved Problems Electronic Signature(s) Signed: 08/16/2019 5:10:21 PM By: Linton Ham MD Entered By: Linton Ham on 08/16/2019 11:26:58 Yi, Holley Dexter ([id number]) -------------------------------------------------------------------------------- Progress Note Details Patient Name: Diona Fanti. Date of Service: 08/16/2019 10:15 AM Medical Record Number: [id number] Patient Account Number: [id number] Date of Birth/Sex: 1956-09-15 (62 y.o. M) Treating RN: Cornell Barman Primary Care Provider: Domenica Fail Other Clinician: Referring Provider: Domenica Fail Treating Provider/Extender: Tito Dine in Treatment: 11 Subjective History of Present Illness (HPI) ADMISSION 06/01/18 This is a 63 year old man who is sent to [place] currently residing at [facility] skilled facility. He has a complicated recent medical history. His sister accompanies him tells [place] that he has been somewhat disabled since suffering a left basal ganglial CVA in 2018. At that point in time he also had myocardial infarctions.  More recently he has been hospitalized from 04/04/18 through 04/12/18 with septic shock, coronary artery disease, congestive heart failure and an acute CVA. During this hospitalization he was noted to have sacral wounds but these were not felt to be the source of infection however the source of the infection was never really determined at that point. I believe he was sent to a skilled facility. He reports that at the hospital on 05/09/18 through 05/18/18. At that point he was noted to have a large abscess of the right buttock. He required a surgical IandD by Dr. Gerrit Friends of general surgery. Culture of this grew Citrobacter,Eikenella and Bacteroides. He had a  CT scan of the pelvis that showed an abnormal soft tissue collection just caudal to the right ischial tuberosity measuring 5.9 and by 4.7. This also involved the distal right deep muscles. There was some cortical bone loss on the right ischial tuberosity and there was some concern with osteomyelitis. He was felt to require treatment for osteomyelitis based on clinical and radiographic findings. He did not have an MRI. Past medical history includes; abdominal aortic aneurysm, carotid stenosis, hypertension, osteoarthritis, interstitial lung disease on chronic steroids, rheumatoid arthritis, congestive heart failure/ischemic cardiomyopathy with an ejection fraction of 35-40% Also noteworthy is on 05/09/18 his albumin was 2.2 on admission to hospital. I don't think this is actually been repeated. The patient states his appetite is good and he is taking supplements 100%. 06/15/18-He is seen in follow up vibration for right buttock ulcer; this is stable in appearance with minimal amount of non-viable tissue. 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 We've been using a wound VAC. This is centered over the right ischial tuberosity. He is making generally good progress. He was severely hypoalbuminemic at one time although he states that he eats well and takes his supplements. 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 We have been using silver collagen over the superior aspect of the right ischial tuberosity under wound VAC.he has made some nice improvements. The dimensions of the wound are certainly smaller. He has no exposed bone. We received lab work from [facility]. His prealbumin was 18.6 and albumin at 2.57. This is obviously still low. I am not sure what interventions are in place. I know he is on supplements. I  discussed this with the patient. I told him that the albumin represents already moderate to severe protein malnutrition. This would contribute it to do difficulty healing the wound 07/20/18; I received copies of lab work from [facility] on 07/06/18 documenting a sedimentation rate of 25 which seems to be in line with what I see from previous values however his C-reactive protein was 110.8 versus 84.7 on 06/22/18. I have looked through [facility] health Link I don't see a prior value for either inflammatory measure. he has been treated with [name] which I think he is still on for osteomyelitis of the right ischial tuberosity as well as a soft tissue collection just caudad to the right ischial tuberosity itself. Felt to have a soft tissue abscess and/or stigmata of necrotizing fasciitis. I'm not sure who is following this. His CT scan was on 05/09/18 yet he still remains on Invanz. Looking back through the original discharge summary the [name] was supposed to finish on 07/05/18. 08/03/18 the patient continues to make good improvement. The area over  the right ischial tuberosity has filled in nicely. His developed some what looks to be candidal skin infection in the periwound. I think they've taken the wound VAC off and are applying nystatin 08/17/18; 2 week follow-up for this resident from [facility]. They resumed the wound VAC. The area continues to fill in nicely. The only problem area is the tissue over the ischial tuberosity itself which needs to come up a bit. For this reason Lazaro Arms'm Koppel, Jermanie W. ([id number]) continuing the wound VAC. Otherwise this is doing remarkably well. The patient states that he is eating everything they give him although I don't see a follow-up albumin. 08/31/18; the patient states he had blood work done today. I do not have these results. He claims to be eating well. He continues with a wound VAC and the wound is making nice progress. This was a 1 time a deep stage IV wound over the  right ischial tuberosity with accompanied osteomyelitis 09/14/18; patient is still using a wound VAC to the large stage IV pressure ulcer On the right ischial tuberosity area.. This is coming quite nicely he still has one Onalee HuaDavid that has some depth but I think by the next time we see him in 2 weeks we should be able to move on from the wound VAC. He is still at [facility]. As far as I am aware he is eating and drinking well. I have not seen follow-up albumin studies on him recently but the message I'm getting is that he is eating well and taking his supplements. The patient states he is not very mobile and I am hopeful that we are able to insure pressure relief here however if we hadn't been ensuring pressure relief I doubt this would've closed in as well as it has even with the wound VAC. He does not complain of any systemic issues 09/28/2018 for follow-up and management of stage IV sacral ulcer that is being managed with a wound VAC. He is a resident at a long-term care facility, [facility]. The wound continues to have some depth and width some granulation of tissue on the outer borders of the wound. The wound was tender to touch today during exam. He denies any pain to the wound when it is not being touched. Recent fevers, chills, nausea, vomiting, or shortness of breath. Hopeful over the next couple of weeks he will be able to transition from the wound VAC. 10/19/2018; this patient has a stage IV over the right ischial tuberosity. The wound is come to the surface of the skin and looks very healthy at this point. His wound VAC can be discontinued. The patient apparently is eating well taking his supplements. He has no systemic complaints 11/23/2018. The patient has a stage IV wound over his right ischial tuberosity that is now down to a quarter sized superficial area. There is no depth to this. The tissue looks healthy. No debridement was required. Continued improvement. We  are using silver alginate and bordered foam 2/19; the original stage IV wound over his right ischial tuberosity. Continues to make nice progress in terms of dimensions. There is no depth to this. We have been using silver alginate and border foam 3/11; this is in follow-up of his original stage IV wound over his right ischial tuberosity. This continues to make nice progress using silver alginate and border foam vigorous offloading. He is at [facility] skilled facility. Patient complains of terrible pain in his hands and having trouble getting  pain medication which he has to ask for as needed. He apparently has a history of rheumatoid arthritis and I note he is on prednisone and chloroquine. His rheumatologist is at [facility]. 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 This continues to make decent progress is smaller. There is some undermining at roughly 12-3 o'clock. We have been using silver alginate 4/29; patient's wound continues to get smaller. The remaining area is epithelializing. The undermining area still is not epithelialized however. We have been using silver collagen since the last time he is here. He does not come any notes from the facility where he lives [facility] but he claims to be eating well and vigorously offloading this area 5/20-Patient returns to clinic for right ischial wound. No notes available from facility, wound appears to be improving, we have been using Prisma and he has been offloading this area while using his wheelchair in fact he has started to use the walker again READMISSION 05/31/2019 This is a patient we cared for her for a prolonged period of time with a stage IV pressure area over his right ischio tuberosity. 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 We were able to get this to markedly improve with a wound VAC over time. We last saw  this patient in clinic on 5/20 we are using silver collagen and a small wound that was just about closed. Per the patient this wound actually did progress to closure although I do not have any independent verification of this from [facility] where he lives. He states that this reopened about 2 to 3 months ago. His timeframe is off however again I do not have any independent verification. He has a small wound with a lip lip of tissue over the top of this 8/26; wound is down to a very tiny open area about slight hyper granulation. The area is healing with epithelialization and a bit of a divot. I used silver nitrate. Continue with Hydrofera Blue 9/23; patient arrives today with the wound quite a bit worse. There is a lip of skin and subcutaneous tissue over the majority of the visible part of this wound however this is fairly deep underneath this with exposed bone. At one point he had a large stage IV wound and this with underlying osteomyelitis. We have been using Hydrofera Blue. Also, the patient is being discharged in 8 days to home to live with his sister in [place]. There will be a lot of arrangements here. I counseled the patient that he is absolutely going to have to be rigorous and offloading this area. I change the primary dressing to silver alginate [name], Tiron W. 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 He was treated with 6 weeks worth of IV antibiotics and he was at [facility] skilled facility. This almost closed although the last time he was here there was some more depth and probing to bone. Since then on 10/1 he was discharged to home where he is being cared for by his sister. He was hospitalized from 08/07/2019 through 08/13/2019 with acute on chronic respiratory failure secondary to known interstitial lung disease and chronic combined congestive heart failure. His echocardiogram showed  an ejection fraction of 20 to 25% he had PCI done with a stent of the right coronary artery.  He is returned home and his sister is with him today. She reports that the wound is a lot worse since his hospitalization. There is clearly large areas of exposed bone in the area is a lot larger some tenderness they have been using silver alginate. He apparently is eating better. He is not incontinent able to get up and transfer himself. He has a recliner but he is sleeping in the bed Objective Constitutional Sitting or standing Blood Pressure is within target range for patient.[name] Respirations regular, non-labored and within target range.O2. Temperature is normal and within the target range for the patient.[name] He is not in any distress somewhat more pale than I am used to seeing O2 1 bibasilar Craig up bibasilar crackles bibasilar crackles. Vitals Time Taken: 10:25 AM, Height: 73 in, Weight: 155 lbs, BMI: 20.4, Temperature: 98.2 F, Pulse: 94 bpm, Respiratory Rate: 18 breaths/min, Blood Pressure: 102/45 mmHg. Respiratory Respiratory effort is easy and symmetric bilaterally. Rate is normal at rest and on room air.. Crackles bilaterally at the bases. Cardiovascular Heart sounds distant JVP not elevated. General Notes: Wound exam; the wound is considerably larger than last time. There is clearly exposed bone. Some tenderness around the wound. No surrounding erythema no purulent drainage. Integumentary (Hair, Skin) Wound #2 status is Open. Original cause of wound was Pressure Injury. The wound is located on the Right Gluteus. The wound measures 2.5cm length x 1.9cm width x 0.9cm depth; 3.731cm^2 area and 3.358cm^3 volume. There is bone and Fat Layer (Subcutaneous Tissue) Exposed exposed. There is undermining starting at 11:00 and ending at 7:00 with a maximum distance of 1.6cm. There is a medium amount of serous drainage noted. The wound margin is epibole. There is large (67- 100%) pink granulation within  the wound bed. There is a small (1-33%) amount of necrotic tissue within the wound bed including Adherent Slough. Assessment Active Problems ICD-10 RANSOM, NICKSON. ([id number]) Pressure ulcer of right buttock, stage 4 Plan Wound Cleansing: Wound #2 Right Gluteus: Clean wound with Normal Saline. Cleanse wound with mild soap and water Anesthetic (add to Medication List): Wound #2 Right Gluteus: Topical Lidocaine 4% cream applied to wound bed prior to debridement (In Clinic Only). Skin Barriers/Peri-Wound Care: Wound #2 Right Gluteus: Skin Prep - to peri-wound Primary Wound Dressing: Wound #2 Right Gluteus: Silver Alginate - packed lighty Secondary Dressing: Wound #2 Right Gluteus: Boardered Foam Dressing Dressing Change Frequency: Wound #2 Right Gluteus: Change dressing every day. - Monday, Wednesday, Friday by home health. May skip Wednesday if patient has wound care visit. Homehealth, please train patient's sister for additional days. Follow-up Appointments: Wound #2 Right Gluteus: Return Appointment in 2 weeks. Off-Loading: Wound #2 Right Gluteus: Mattress - Order bed/air mattress-please notify wound care center [phone] if we need to order bed and mattress for the patient. Turn and reposition every 2 hours Home Health: Wound #2 Right Gluteus: Continue Home Health Visits - [facility] bed with air mattress. If wound care needs to order bed and mattress, please let us know [phone]. Home Health Nurse may visit PRN to address patient s wound care needs. FACE TO FACE ENCOUNTER: MEDICARE and MEDICAID PATIENTS: I certify that this patient is under my care and that I had a face-to-face encounter that meets the physician face-to-face encounter requirements with this patient on this date. The encounter with the patient was in whole or in part for the following MEDICAL CONDITION: (primary reason for Home Healthcare) MEDICAL NECESSITY: I certify,  that based  on my findings, NURSING services are a medically necessary home health service. HOME BOUND STATUS: I certify that my clinical findings support that this patient is homebound (i.e., Due to illness or injury, pt requires aid of supportive devices such as crutches, cane, wheelchairs, walkers, the use of special transportation or the assistance of another person to leave their place of residence. There is a normal inability to leave the home and doing so requires considerable and taxing effort. Other absences are for medical reasons / religious services and are infrequent or of short duration when for other reasons). If current dressing causes regression in wound condition, may D/C ordered dressing product/s and apply Normal Saline Moist Dressing daily until next Wound Healing Center / Other MD appointment. Notify Wound Healing Center of regression in wound condition at [phone]. Please direct any NON-WOUND related issues/requests for orders to patient's Primary Care Physician Medications-please add to medication list.: Wound #2 Right Gluteus: Other: - Nutritional supplement daily Laboratory ordered were: HARDEN, PURGASON. ([id number]) Wound culture routine - right gluteal Devices ordered were: Bed- Mattress-Overlay or Replacement - Level 2 1. We are using silver alginate and border foam change daily 2. Swab culture done for CandS but no empiric antibiotics 3. If this wound deteriorates or stalls he likely will need consideration of recurrence of the underlying osteomyelitis. A bone biopsy would be possible 4. He was counseled to offload this area. This would be both in bed and in the recliner. We will look into getting him a level 2 surface or what ever surface he is entitled to. According to his sister he has only straight Medicaid Electronic Signature(s) Signed: 08/16/2019 5:10:21 PM By: Baltazar Najjar MD Entered By: Baltazar Najjar on 08/16/2019 11:39:10 Jarboe, [name]  ([id number]) -------------------------------------------------------------------------------- SuperBill Details Patient Name: [name]. Date of Service: 08/16/2019 Medical Record Number: [id number] Patient Account Number: [id number] Date of Birth/Sex: [birth year] (63 y.o. M) Treating RN: Huel Coventry Primary Care Provider: Sindy Messing Other Clinician: Referring Provider: Sindy Messing Treating Provider/Extender: Maxwell Caul Weeks in Treatment: 11 Diagnosis Coding ICD-10 Codes Code Description L89.314 Pressure ulcer of right buttock, stage 4 Facility Procedures CPT4 Code: [id number] Description: 99213 - WOUND CARE VISIT-LEV 3 EST PT Modifier: Quantity: 1 Physician Procedures CPT4 Code: [id number] Description: 99213 - WC PHYS LEVEL 3 - EST PT ICD-10 Diagnosis Description L89.314 Pressure ulcer of right buttock, stage 4 Modifier: Quantity: 1 Electronic Signature(s) Signed: 08/16/2019 5:10:21 PM By: Baltazar Najjar MD Entered By: Baltazar Najjar on 08/16/2019 11:39:43

## 2019-08-18 ENCOUNTER — Telehealth: Payer: Self-pay | Admitting: Internal Medicine

## 2019-08-18 LAB — AEROBIC CULTURE W GRAM STAIN (SUPERFICIAL SPECIMEN)

## 2019-08-18 NOTE — Telephone Encounter (Signed)
Spoke with Roselie Awkward with [facility]. He is requesting MR to sign orders for home health PT and nursing. Pt has a pending OV with MR on 08/21/2019. Will route message to MR to make him aware of this request.

## 2019-08-21 ENCOUNTER — Telehealth: Payer: Self-pay | Admitting: Internal Medicine

## 2019-08-21 ENCOUNTER — Encounter: Payer: Self-pay | Admitting: Internal Medicine

## 2019-08-21 ENCOUNTER — Other Ambulatory Visit: Payer: Self-pay

## 2019-08-21 ENCOUNTER — Ambulatory Visit (INDEPENDENT_AMBULATORY_CARE_PROVIDER_SITE_OTHER): Payer: Medicaid Other | Admitting: Internal Medicine

## 2019-08-21 VITALS — BP 110/82 | HR 79 | Temp 97.3°F | Ht 73.0 in | Wt 157.0 lb

## 2019-08-21 DIAGNOSIS — M359 Systemic involvement of connective tissue, unspecified: Secondary | ICD-10-CM

## 2019-08-21 DIAGNOSIS — J9611 Chronic respiratory failure with hypoxia: Secondary | ICD-10-CM | POA: Diagnosis not present

## 2019-08-21 DIAGNOSIS — J8489 Other specified interstitial pulmonary diseases: Secondary | ICD-10-CM

## 2019-08-21 DIAGNOSIS — M059 Rheumatoid arthritis with rheumatoid factor, unspecified: Secondary | ICD-10-CM

## 2019-08-21 DIAGNOSIS — J849 Interstitial pulmonary disease, unspecified: Secondary | ICD-10-CM

## 2019-08-21 NOTE — Telephone Encounter (Signed)
That should really come from patient PCP [name], PA-C because the PT and nursing have to do with deconditioning and gluteal decub

## 2019-08-21 NOTE — Telephone Encounter (Signed)
Spoke with Juliann Pulse at [facility]. She stated that the only thing she could see in the chart that was needed was documentation of a face to face visit. Advised her that the patient was seen today in the office but it does not appear that we ordered any HH. Did advise her that MR has refused to sign the order for PT. She verbalized understanding.  Left message for Cathey to call back.

## 2019-08-21 NOTE — Progress Notes (Signed)
OV 04/29/2018  Chief Complaint  Patient presents with  . Follow-up    patient was in the hospital for sepsis 04/04/18, pft done today.     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 At last visit contemplating starting CellCept. He returns to permit function test that shows continued stability/improvement of his FVC by in talking to him and his wife it appears that overall his functional status has declined. He has admissions for sepsis which are noted in the review of the chart as well. He was discharged onJune 11 2019 to [facility] nursing home. The wife is upset they're not doingin a physical therapy. She says she is so deconditioned he is not able to get out of the chair. His ECOG is 4. In fact today's having some worsening shivering. But they refused to go to the ER. She also admitted to the fact that he is not increased pallor and some discoloration in his shin.recent labs are documented below    Results for Ryan, [name] (MRN [id number]) as of 04/29/2018 17:07  Ref. Range 04/12/2018 05:31  Creatinine Latest Ref Range: 0.61 - 1.24 mg/dL 4.09   Inpatient hospital consult August 07, 2019   Patient follows up with [name] in interstitial lung disease clinic.  He has rheumatoid arthritis related UIP/ILD.  This is a progressive phenotype.  I last saw him in June 2019 at that time his lung function was stable but he had a deterioration in his functional status because of sepsis.  After that he was lost to follow-up.  Review of the chart indicates that he has had a nursing home stay all along and according to him he got discharged home only on August 03, 2019.  He says over the course of a year he slowly gained his functional status and was able to walk.  He says he did not need oxygen.  He has a sacral decub that is still present.  He did see his rheumatologist most recently on July 19, 2019 at [facility] in [place] at this point in time  he is only been on Plaquenil for over a year.  He not been on immunomodulator.  In the past he has had Imuran and sulfasalazine and had failed those..  At this visit because of pain is rheumatologist the rheumatologist gave 10 days of prednisone taper.  He tells me August 03, 2019 he was discharged from a nursing home to his home and then abruptly in the last 1 day he got acutely short of breath and presented to the ER.  He was initially requiring 6 L of oxygen.  Currently now needing 2 L of oxygen.  He says he is feeling fine otherwise.  No obvious edema.  He is not on anticoagulation.  Pulmonary embolism has not been ruled out this admission he did have lactic acidosis and a slightly elevated high-sensitivity troponin.   OV 08/21/2019  Subjective:  Patient ID: [name], male , DOB: January 18, 1956 , age 63 y.o. , MRN: [id number] , ADDRESS: [address] [place] [address]   08/21/2019 -   Chief Complaint  Patient presents with  . Hospitalization Follow-up    ILD. Hospital F/U. ED 10/5 for Pneumonia. Had two cardiac stents placed. Currenlty taking prednisone taper given at ED. Quit smoking a year ago.    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 -last seen in the  office June 2019 and then as pulmonary consult in the hospital early October 2020.  He has spent more than 1 year in the nursing home and then was discharged home in September 2020 but then got admitted to the hospital within a few days with acute on chronic hypoxemic respiratory failure pulmonary embolism was ruled out.  High-resolution CT chest showed worsening of the ILD but he also had new worsening of his left ventricular ejection fraction [he has chronic systolic heart failure].  He did have a cardiac stent placed.  He is on diuresis.  Despite this at this follow-up he continues to remain on 4 L oxygen 24/7.  His sister is here with him.  Since going home  he is more ambulatory and his functional status is improved although it is still ECOG-3.  He uses a wheelchair just to go into the doctor's offices.  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  Apparently he needs major surgery for this.  In terms of his rheumatoid arthritis he is not on any immunomodulator.  He is only on Plaquenil.  He is finishing his prednisone from the recent hospitalization.    Results for Ryan, [name] (MRN [id number]) as of 04/29/2018 17:07  Ref. Range 02/25/2017 15:45 05/27/2017 15:27 12/17/2017 10:26 04/29/2018 15:47  FVC-Pre Latest Units: L 2.57 2.85 2.86 2.88  FVC-%Pred-Pre Latest Units: % 49 54 57 58  Results for Ryan, [name] (MRN [id number]) as of 04/29/2018 17:07  Ref. Range 02/25/2017 15:45 05/27/2017 15:27 12/17/2017 10:26 04/29/2018 15:47  DLCO unc Latest Units: ml/min/mmHg 11.65 13.66 11.16 12.18  DLCO unc % pred Latest Units: % 32 37 32 35    ECHO   HRCT 08/07/2019  IMPRESSION: 1. There is moderate to severe pulmonary fibrosis in a pattern without clear gradient, featuring predominantly tubular bronchiectasis, extensive subpleural and peribronchovascular bronchiolectasis, and extensive peribronchovascular ground-glass. There are possible areas of honeycombing, for example in the lateral segment right middle lobe. Findings, particularly bronchiolectasis, are slightly worsened over time in comparison to examination dated 08/17/2017, although there may be some component of acutely superimposed ground-glass which is difficult to account for. Findings remain generally in keeping with known diagnosis of rheumatoid associated ILD.  2. Mild, diffuse interlobular septal thickening and small right, trace left pleural effusions. There are scattered areas of new ground-glass, for example in the left pulmonary apex. Findings consistent with edema, atypical/viral infection, or acute inflammatory flare of  interstitial lung disease.  3. There is a new nodule of the right pulmonary apex measuring 9 mm (series 8, image 39). Recommend CT follow-up in 3-6 months to assess for stability or resolution of this possibly infectious or inflammatory airspace opacity.  4.  Coronary artery disease.  Aortic Atherosclerosis (ICD10-I70.0).  5. Enlargement of the main pulmonary artery, as can be pulmonary hypertension.   Electronically Signed   By: Lauralyn Primes M.D.   On: 08/08/2019 08:48   1. Normal RA pressure, PCWP still elevated.  2. Mild pulmonary venous hypertension.  3. Preserved cardiac output.  4. Patent LIMA-LAD with known 70% far distal LAD stenosis.  5. 80-90% mid RCA stenosis.  6. The 2 SVGs and left main known to be occluded.   Patient had several days of chest pain prior to admission though troponin not markedly elevated.  Concerned for unstable angina, will plan PCI to RCA today.    ECHO 08/08/2019 IMPRESSIONS    1. Left ventricular ejection  fraction, by visual estimation, is 25 to 30%. The left ventricle has severely decreased function. Moderately increased left ventricular size. There is no left ventricular hypertrophy.  2. Multiple segmental abnormalities exist. See findings.  3. Elevated mean left atrial pressure.  4. Left ventricular diastolic Doppler parameters are consistent with pseudonormalization pattern of LV diastolic filling.  5. Global right ventricle has normal systolic function.The right ventricular size is normal. No increase in right ventricular wall thickness.  6. Left atrial size was severely dilated.  7. Right atrial size was normal.  8. The mitral valve is normal in structure. Mild to moderate mitral valve regurgitation.  9. The tricuspid valve is normal in structure. Tricuspid valve regurgitation is trivial. 10. The aortic valve is tricuspid Aortic valve regurgitation was not visualized by color flow Doppler. 11. The pulmonic valve was normal in  structure. Pulmonic valve regurgitation is trivial by color flow Doppler. 12. Normal pulmonary artery systolic pressure. 13. Compared to June 2019, overall findings are similar, but overall LVEF is slightly worse.  RHC and LHC 08/10/2019  1. Normal RA pressure, PCWP still elevated.  2. Mild pulmonary venous hypertension.  3. Preserved cardiac output.  4. Patent LIMA-LAD with known 70% far distal LAD stenosis.  5. 80-90% mid RCA stenosis.  6. The 2 SVGs and left main known to be occluded.   Patient had several days of chest pain prior to admission though troponin not markedly elevated.  Concerned for unstable angina, will plan PCI to RCA today.   LHC 08/10/2019  Prox RCA to Mid RCA lesion is 80% stenosed.  A stent was successfully placed.  Post intervention, there is a 0% residual stenosis.  ROS - per HPI     has a past medical history of AAA (abdominal aortic aneurysm) (HCC), AKI (acute kidney injury) (HCC), Arthritis, CAD in native artery, Carotid artery disease (HCC), Chronic combined systolic and diastolic CHF (congestive heart failure) (HCC), COPD (chronic obstructive pulmonary disease) (HCC), GERD (gastroesophageal reflux disease), Hyperkalemia, ILD (interstitial lung disease) (HCC), Lung nodule, MI (myocardial infarction) (HCC), Normocytic anemia, NSVT (nonsustained ventricular tachycardia) (HCC), Pancreatitis, Pneumonia (11/2017), RA (rheumatoid arthritis) (HCC), Stroke (HCC), and Tobacco abuse.   reports that he quit smoking about 21 months ago. His smoking use included cigarettes. He has a 47.00 pack-year smoking history. He has never used smokeless tobacco.  Past Surgical History:  Procedure Laterality Date  . CARDIAC CATHETERIZATION    . CAROTID ENDARTERECTOMY Left   . CORONARY STENT INTERVENTION N/A 08/10/2019   Procedure: CORONARY STENT INTERVENTION;  Surgeon: Runell Gess, MD;  Location: [facility] INVASIVE CV LAB;  Service: Cardiovascular;  Laterality: N/A;  . Heart  Bypass  10/1992  . INCISION AND DRAINAGE PERIRECTAL ABSCESS Right 05/09/2018   Procedure: IRRIGATION AND DEBRIDEMENT  RIGHT BUTTOCK ABSCESS;  Surgeon: Darnell Level, MD;  Location: [facility] ORS;  Service: General;  Laterality: Right;  . LEFT HEART CATH AND CORS/GRAFTS ANGIOGRAPHY N/A 03/01/2017   Procedure: Left Heart Cath and Cors/Grafts Angiography;  Surgeon: Lyn Records, MD;  Location: [facility] INVASIVE CV LAB;  Service: Cardiovascular;  Laterality: N/A;  . RIGHT/LEFT HEART CATH AND CORONARY/GRAFT ANGIOGRAPHY N/A 08/10/2019   Procedure: RIGHT/LEFT HEART CATH AND CORONARY/GRAFT ANGIOGRAPHY;  Surgeon: Laurey Morale, MD;  Location: [facility] INVASIVE CV LAB;  Service: Cardiovascular;  Laterality: N/A;  . Throat Biopsy     Cat scratch fever    No Known Allergies  Immunization History  Administered Date(s) Administered  . Fluad Quad(high Dose 65+) 06/19/2019  .  Influenza,inj,Quad PF,6+ Mos 11/02/2016, 09/27/2017  . Pneumococcal Polysaccharide-23 04/09/2018  . Pneumococcal-Unspecified 11/02/2016  . Tdap 03/09/2017    Family History  Problem Relation Age of Onset  . Heart murmur Mother   . Heart disease Mother   . Heart attack Mother   . Suicidality Father   . Heart attack Father   . Stroke Brother   . Heart attack Brother   . Heart attack Maternal Grandmother   . Cancer Maternal Grandmother        unknown type  . Lung cancer Maternal Aunt   . Lung cancer Maternal Uncle   . Cancer Maternal Aunt        unknown cancer  . Heart attack Brother      Current Outpatient Medications:  .  acetaminophen (TYLENOL) 500 MG tablet, Take 1,000 mg by mouth every 6 (six) hours as needed for mild pain. , Disp: , Rfl:  .  albuterol (PROVENTIL HFA) 108 (90 Base) MCG/ACT inhaler, Inhale 2 puffs into the lungs every 6 (six) hours as needed for wheezing or shortness of breath., Disp: 8 g, Rfl: 0 .  albuterol (PROVENTIL) (2.5 MG/3ML) 0.083% nebulizer solution, Take 3 mLs (2.5 mg total) by nebulization every 6 (six)  hours as needed for wheezing or shortness of breath., Disp: 75 mL, Rfl: 0 .  aspirin EC 81 MG tablet, Take 81 mg by mouth daily. , Disp: , Rfl:  .  atorvastatin (LIPITOR) 40 MG tablet, Take 1 tablet (40 mg total) by mouth daily at 6 PM. (Patient taking differently: Take 80 mg by mouth daily at 6 PM. ), Disp: 30 tablet, Rfl: 11 .  buPROPion (WELLBUTRIN SR) 100 MG 12 hr tablet, Take 100 mg by mouth 2 (two) times daily., Disp: , Rfl:  .  carvedilol (COREG) 3.125 MG tablet, Take 1 tablet (3.125 mg total) by mouth 2 (two) times daily with a meal., Disp: 60 tablet, Rfl: 0 .  cholecalciferol (VITAMIN D3) 25 MCG (1000 UT) tablet, Take 1,000 Units by mouth daily., Disp: , Rfl:  .  clopidogrel (PLAVIX) 75 MG tablet, Take 1 tablet (75 mg total) by mouth daily with breakfast., Disp: 30 tablet, Rfl: 0 .  furosemide (LASIX) 40 MG tablet, Take 1 tablet (40 mg total) by mouth daily., Disp: 30 tablet, Rfl: 0 .  gabapentin (NEURONTIN) 300 MG capsule, Take 1 capsule (300 mg total) by mouth every 8 (eight) hours., Disp: 90 capsule, Rfl: 4 .  guaiFENesin (MUCINEX) 600 MG 12 hr tablet, Take 600 mg by mouth 2 (two) times daily as needed for cough or to loosen phlegm., Disp: , Rfl:  .  HYDROcodone-acetaminophen (NORCO) 10-325 MG tablet, Take 1 tablet by mouth 3 (three) times daily., Disp: 15 tablet, Rfl: 0 .  hydroxychloroquine (PLAQUENIL) 200 MG tablet, Take 1 tablet (200 mg total) by mouth 2 (two) times daily., Disp: , Rfl: 0 .  magnesium hydroxide (MILK OF MAGNESIA) 400 MG/5ML suspension, Take 30 mLs by mouth daily as needed for mild constipation. And at bedtime, Disp: , Rfl:  .  Menthol-Methyl Salicylate (MUSCLE RUB EX), Apply 1 application topically as needed (for shoulder or knee pain). , Disp: , Rfl:  .  mirtazapine (REMERON) 7.5 MG tablet, Take 7.5 mg by mouth at bedtime., Disp: , Rfl:  .  nicotine (NICODERM CQ - DOSED IN MG/24 HR) 7 mg/24hr patch, Place 1 patch (7 mg total) onto the skin daily., Disp: 28 patch, Rfl:  0 .  nutrition supplement, JUVEN, (JUVEN) PACK, Take 1  packet by mouth 2 (two) times daily between meals., Disp: , Rfl: 0 .  nystatin cream (MYCOSTATIN), Apply topically 2 (two) times daily. (Patient taking differently: Apply 1 application topically 2 (two) times daily. ), Disp: 30 g, Rfl: 0 .  polyethylene glycol (MIRALAX / GLYCOLAX) packet, Take 17 g by mouth 2 (two) times daily. Until having 1 soft BM daily, then can decrease to daily or as needed (Patient taking differently: Take 17 g by mouth daily. Until having 1 soft BM daily, then can decrease to daily or as needed), Disp: 14 each, Rfl: 0 .  potassium chloride SA (K-DUR,KLOR-CON) 20 MEQ tablet, Take 2 tablets (40 mEq total) by mouth daily. (Patient taking differently: Take 20 mEq by mouth 2 (two) times daily. ), Disp: , Rfl:  .  predniSONE (DELTASONE) 10 MG tablet, Taper dose: 60mg  daily x 4 days, then 40mg  po daily x 4 days, then 20mg  po daily x 4 days, then 10mg  po daily x 4 days, then 5mg  po daily x 4 days, then stop. Zero refills., Disp: , Rfl:  .  saccharomyces boulardii (FLORASTOR) 250 MG capsule, Take 1 capsule (250 mg total) by mouth 2 (two) times daily., Disp: , Rfl:  .  spironolactone (ALDACTONE) 25 MG tablet, Take 0.5 tablets (12.5 mg total) by mouth at bedtime., Disp: 30 tablet, Rfl: 0 .  tamsulosin (FLOMAX) 0.4 MG CAPS capsule, Take 1 capsule (0.4 mg total) by mouth daily after supper., Disp: 30 capsule, Rfl: 4      Objective:   Vitals:   08/21/19 1054  BP: 110/82  Pulse: 79  Temp: (!) 97.3 F (36.3 C)  TempSrc: Temporal  SpO2: 95%  Weight: 157 lb (71.2 kg)  Height: 6\' 1"  (1.854 m)    Estimated body mass index is 20.71 kg/m as calculated from the following:   Height as of this encounter: 6\' 1"  (1.854 m).   Weight as of this encounter: 157 lb (71.2 kg).  @WEIGHTCHANGE @  [employer]   08/21/19 1054  Weight: 157 lb (71.2 kg)     Physical Exam  General Appearance:    Alert, cooperative, no distress, appears  stated age - older , Deconditioned looking - yes , OBESE  - no, Sitting on Wheelchair -  yes  Head:    Normocephalic, without obvious abnormality, atraumatic  Eyes:    PERRL, conjunctiva/corneas clear,  Ears:    Normal TM's and external ear canals, both ears  Nose:   Nares normal, septum midline, mucosa normal, no drainage    or sinus tenderness. OXYGEN ON  - yes . Patient is @ 4L   Throat:   Lips, mucosa, and tongue normal; teeth and gums normal. Cyanosis on lips - no  Neck:   Supple, symmetrical, trachea midline, no adenopathy;    thyroid:  no enlargement/tenderness/nodules; no carotid   bruit or JVD  Back:     Symmetric, no curvature, ROM normal, no CVA tenderness  Lungs:     Distress - no , Wheeze no, Barrell Chest - no, Purse lip breathing - no, Crackles - YES   Chest Wall:    No tenderness or deformity.    Heart:    Regular rate and rhythm, S1 and S2 normal, no rub   or gallop, Murmur - no  Breast Exam:    NOT DONE  Abdomen:     Soft, non-tender, bowel sounds active all four quadrants,    no masses, no organomegaly. Visceral obesity - no  Genitalia:  NOT DONE  Rectal:   NOT DONE  Extremities:   Extremities - normal, Has Cane - yes, Clubbing - no, Edema - no  Pulses:   2+ and symmetric all extremities  Skin:   Stigmata of Connective Tissue Disease - RA +  Lymph nodes:   Cervical, supraclavicular, and axillary nodes normal  Psychiatric:  Neurologic:   Pleasant - yes, Anxious - no, Flat affect - yes  CAm-ICU - neg, Alert and Oriented x 3 - yes, Moves all 4s - yes, Speech - normal, Cognition - intact           Assessment:       ICD-10-CM   1. Interstitial lung disease due to connective tissue disease (HCC)  J84.89    M35.9   2. ILD (interstitial lung disease) (HCC)  J84.9   3. Rheumatoid arthritis with positive rheumatoid factor, involving unspecified site (HCC)  M05.9   4. Chronic respiratory failure with hypoxia (HCC)  J96.11        Plan:     Patient Instructions      ICD-10-CM   1. Interstitial lung disease due to connective tissue disease (HCC)  J84.89    M35.9   2. ILD (interstitial lung disease) (HCC)  J84.9   3. Rheumatoid arthritis with positive rheumatoid factor, involving unspecified site (HCC)  M05.9   4. Chronic respiratory failure with hypoxia (HCC)  J96.11    Your pulmonary fibrosis or interstitial lung disease is secondary to rheumatoid arthritis  This is definitely worsened over time based on clinical presentation with increasing oxygen needs and also worsening CT scan of the chest [though some of the worsening could be related to heart issues]  Plan -I think considering a immunomodulator such as CellCept or Imuran for pulmonary fibrosis secondary to rheumatoid arthritis can put you at increased risk for sepsis which you have just recently recovered from.  Therefore I want to hold off on this option  -Other option is to consider an antifibrotic such as nintedanib or pirfenidone.  These do have some GI side effects and liver test monitoring requirements.  However am concerned that this antifibrotic's could interfere with wound healing of the sacral decub.  Therefore we will hold off on this option   -I do encourage you to participate in the ILD-Pro research registry protocol for patients such as you with autoimmune pulmonary fibrosis that showed worsening - meet with [facility] research team 08/21/2019 - if you qualify for study  Followup   = 4-6 weeks to ILD clinic -30 min slot - if sacral wound healing well then can consider anti-fibrotic   > 50% of this > 25 min visit spent in face to face counseling or coordination of care - by this undersigned MD - Dr Kalman ShanMurali Seidy Labreck. This includes one or more of the following documented above: discussion of test results, diagnostic or treatment recommendations, prognosis, risks and benefits of management options, instructions, education, compliance or risk-factor reduction   SIGNATURE    Dr.  Kalman ShanMurali Demarea Lorey, M.D., F.C.C.P,  Pulmonary and Critical Care Medicine Staff Physician, [facility] Director - Interstitial Lung Disease  Program  Pulmonary Fibrosis [facility] [place], [place], [id number]  Pager: [phone], If no answer or between  15:00h - 7:00h: call [phone] Telephone: [phone]  11:26 AM 08/21/2019

## 2019-08-21 NOTE — Patient Instructions (Addendum)
ICD-10-CM   1. Interstitial lung disease due to connective tissue disease ([place])  J84.89    M35.9   2. ILD (interstitial lung disease) ([place])  J84.9   3. Rheumatoid arthritis with positive rheumatoid factor, involving unspecified site ([place])  M05.9   4. Chronic respiratory failure with hypoxia (HCC)  J96.11    Your pulmonary fibrosis or interstitial lung disease is secondary to rheumatoid arthritis  This is definitely worsened over time based on clinical presentation with increasing oxygen needs and also worsening CT scan of the chest [though some of the worsening could be related to heart issues]  Plan -I think considering a immunomodulator such as CellCept or Imuran for pulmonary fibrosis secondary to rheumatoid arthritis can put you at increased risk for sepsis which you have just recently recovered from.  Therefore I want to hold off on this option  -Other option is to consider an antifibrotic such as nintedanib or pirfenidone.  These do have some GI side effects and liver test monitoring requirements.  However am concerned that this antifibrotic's could interfere with wound healing of the sacral decub.  Therefore we will hold off on this option   -I do encourage you to participate in the ILD-Pro research registry protocol for patients such as you with autoimmune pulmonary fibrosis that showed worsening - meet with [facility] research team 08/21/2019 - if you qualify for study  Followup   = 4-6 weeks to ILD clinic -30 min slot - if sacral wound healing well then can consider anti-fibrotic

## 2019-08-22 ENCOUNTER — Encounter (HOSPITAL_COMMUNITY): Payer: Self-pay | Admitting: *Deleted

## 2019-08-29 ENCOUNTER — Other Ambulatory Visit: Payer: Self-pay

## 2019-08-29 ENCOUNTER — Encounter (HOSPITAL_COMMUNITY): Payer: Self-pay

## 2019-08-29 ENCOUNTER — Ambulatory Visit (HOSPITAL_COMMUNITY)
Admission: RE | Admit: 2019-08-29 | Discharge: 2019-08-29 | Disposition: A | Discharge status: Home / Self Care | Payer: Medicaid Other | Source: Ambulatory Visit | Attending: Cardiology | Admitting: Cardiology

## 2019-08-29 VITALS — BP 103/59 | HR 63 | Wt 142.6 lb

## 2019-08-29 DIAGNOSIS — Z951 Presence of aortocoronary bypass graft: Secondary | ICD-10-CM | POA: Insufficient documentation

## 2019-08-29 DIAGNOSIS — I5042 Chronic combined systolic (congestive) and diastolic (congestive) heart failure: Secondary | ICD-10-CM

## 2019-08-29 DIAGNOSIS — Z79899 Other long term (current) drug therapy: Secondary | ICD-10-CM | POA: Diagnosis not present

## 2019-08-29 DIAGNOSIS — J849 Interstitial pulmonary disease, unspecified: Secondary | ICD-10-CM | POA: Insufficient documentation

## 2019-08-29 DIAGNOSIS — Z8673 Personal history of transient ischemic attack (TIA), and cerebral infarction without residual deficits: Secondary | ICD-10-CM | POA: Insufficient documentation

## 2019-08-29 DIAGNOSIS — I255 Ischemic cardiomyopathy: Secondary | ICD-10-CM | POA: Diagnosis not present

## 2019-08-29 DIAGNOSIS — Z7982 Long term (current) use of aspirin: Secondary | ICD-10-CM | POA: Insufficient documentation

## 2019-08-29 DIAGNOSIS — J9611 Chronic respiratory failure with hypoxia: Secondary | ICD-10-CM | POA: Insufficient documentation

## 2019-08-29 DIAGNOSIS — I252 Old myocardial infarction: Secondary | ICD-10-CM | POA: Diagnosis not present

## 2019-08-29 DIAGNOSIS — Z7902 Long term (current) use of antithrombotics/antiplatelets: Secondary | ICD-10-CM | POA: Insufficient documentation

## 2019-08-29 DIAGNOSIS — Z8249 Family history of ischemic heart disease and other diseases of the circulatory system: Secondary | ICD-10-CM | POA: Insufficient documentation

## 2019-08-29 DIAGNOSIS — Z87891 Personal history of nicotine dependence: Secondary | ICD-10-CM | POA: Insufficient documentation

## 2019-08-29 DIAGNOSIS — J449 Chronic obstructive pulmonary disease, unspecified: Secondary | ICD-10-CM | POA: Diagnosis not present

## 2019-08-29 DIAGNOSIS — I2510 Atherosclerotic heart disease of native coronary artery without angina pectoris: Secondary | ICD-10-CM | POA: Diagnosis not present

## 2019-08-29 DIAGNOSIS — M069 Rheumatoid arthritis, unspecified: Secondary | ICD-10-CM | POA: Insufficient documentation

## 2019-08-29 LAB — CBC
HCT: 48.1 % (ref 39.0–52.0)
Hemoglobin: 14.7 g/dL (ref 13.0–17.0)
MCH: 25.5 pg — ABNORMAL LOW (ref 26.0–34.0)
MCHC: 30.6 g/dL (ref 30.0–36.0)
MCV: 83.5 fL (ref 80.0–100.0)
Platelets: 279 10*3/uL (ref 150–400)
RBC: 5.76 MIL/uL (ref 4.22–5.81)
RDW: 18 % — ABNORMAL HIGH (ref 11.5–15.5)
WBC: 15.4 10*3/uL — ABNORMAL HIGH (ref 4.0–10.5)
nRBC: 0 % (ref 0.0–0.2)

## 2019-08-29 LAB — BASIC METABOLIC PANEL
Anion gap: 12 (ref 5–15)
BUN: 16 mg/dL (ref 8–23)
CO2: 23 mmol/L (ref 22–32)
Calcium: 8.6 mg/dL — ABNORMAL LOW (ref 8.9–10.3)
Chloride: 101 mmol/L (ref 98–111)
Creatinine, Ser: 0.79 mg/dL (ref 0.61–1.24)
GFR calc Af Amer: >60 mL/min (ref >60)
GFR calc non Af Amer: >60 mL/min (ref >60)
Glucose, Bld: 75 mg/dL (ref 70–99)
Potassium: 4.6 mmol/L (ref 3.5–5.1)
Sodium: 136 mmol/L (ref 135–145)

## 2019-08-29 NOTE — Patient Instructions (Signed)
It was great to see you today! No medication changes are needed at this time.   Labs today We will only contact you if something comes back abnormal or we need to make some changes. Otherwise no news is good news!  Your physician recommends that you schedule a follow-up appointment in: 3 weeks with the HF Pharmacy Team  Your physician recommends that you schedule a follow-up appointment in: 6-8 weeks with Dr Aundra Dubin  Do the following things EVERYDAY: 1) Weigh yourself in the morning before breakfast. Write it down and keep it in a log. 2) Take your medicines as prescribed 3) Eat low salt foods-Limit salt (sodium) to 2000 mg per day.  4) Stay as active as you can everyday 5) Limit all fluids for the day to less than 2 liters   At the [facility], you and your health needs are our priority. As part of our continuing mission to provide you with exceptional heart care, we have created designated Provider Care Teams. These Care Teams include your primary Cardiologist (physician) and Advanced Practice Providers (APPs- Physician Assistants and Nurse Practitioners) who all work together to provide you with the care you need, when you need it.   You may see any of the following providers on your designated Care Team at your next follow up: [name] Dr Glori Bickers . Dr Loralie Champagne . Darrick Grinder, NP . Lyda Jester, PA   Please be sure to bring in all your medications bottles to every appointment.   [name]

## 2019-08-29 NOTE — Progress Notes (Signed)
.   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  Patient has had a long history of ILD, followed by Dr. Marchelle Gearingamaswamy.  He had not been on oxygen at home prior to recent admit.  He has rheumatoid arthritis, only on Plaquenil at time of admission.  S/p CABG in 1993, f/u cath in 2018 showed occluded SVG-RCA, seq SVG-OM and ramus, occluded native left main/LAD/LCx/ramus with patent LIMA-LAD (70% distal LAD) and patent RCA with 70% proximal stenosis. Echo in 6/17 showed EF 30-35%, echo in 10/20 showed EF 25-30%.  He had not been seen by a cardiologist in a couple of years.    A few days prior to admission, patient developed chest tightness centrally that waxed and waned but did not fully resolve.  He also developed dyspnea to the point where he was out of breath with any exertion.  He came to the ER and was noted to be hypoxemic, required 6L [place] (has now been titrated down to 2L [place]).  He was treated empirically with ceftriaxone/azithromycin and was started on a steroid taper for possible ILD flare.  CTA chest showed no PE.  High resolution CT chest showed progression of ILD compared to 2018, also with superimposed pulmonary edema versus acute inflammation.  He had been been afebrile.  Systolic BP low int 80s-low 100s.  Lactate was mildly elevated at 2, procalcitonin was not elevated.  Troponin 29 => 36. Antibiotics were eventually stopped as PNA was felt unlikely. He was felt to also be in acute CHF and was started on IV diuretics. Echo showed reduced EF 25-30%, somewhat lower than prior. [facility] PCWP mildly elevated  and RA normal on RHC.  Cardiac output preserved on cath, CI 2.46. Coronary angiography 10/8 showed a tight RCA stenosis (ongoing patency of LIMA-LAD). He underwent RCA intervention w/ DES, placed by Dr. Allyson SabalBerry. Placed on DAPT w/ ASA and Plavix. After diuresis w/ IV Lasix, he was transitioned to PO and able to tolerate low dose  blocker and low dose spironolactone but BP remained too soft for ACE/ARB/ARNi.   He presents to clinic today for post hospital f/u. Feels ok. On supplemental O2 24/7. No increased O2 requirements from his baseline. Denies CP. No LEE or weight gain. Compliant w/ meds. Tolerating well. Notes good urinary output w/ home diuretic. Denies fever and chills. Has wound care f/u soon for sacral decubitus ulcer.      Review of Systems: [y] = yes, [ ]  = no   General: Weight gain [ ] ; Weight loss [ ] ; Anorexia [ ] ; Fatigue [ ] ; Fever [ ] ; Chills [ ] ; Weakness [ ]   Cardiac: Chest pain/pressure [ ] ; Resting SOB [ ] ; Exertional SOB [ ] ; Orthopnea [ ] ; Pedal Edema [ ] ; Palpitations [ ] ; Syncope [ ] ; Presyncope [ ] ; Paroxysmal nocturnal dyspnea[ ]   Pulmonary: Cough [ ] ; Wheezing[ ] ; Hemoptysis[ ] ; Sputum [ ] ; Snoring [ ]   GI: Vomiting[ ] ; Dysphagia[ ] ; Melena[ ] ; Hematochezia [ ] ; Heartburn[ ] ; Abdominal pain [ ] ; Constipation [ ] ; Diarrhea [ ] ; BRBPR [ ]   GU: Hematuria[ ] ; Dysuria [ ] ; Nocturia[ ]   Vascular: Pain in legs with walking [ ] ; Pain in feet with lying flat [ ] ; Non-healing sores [ ] ; Stroke [ ] ; TIA [ ] ; Slurred speech [ ] ;  Neuro: Headaches[ ] ; Vertigo[ ] ; Seizures[ ] ; Paresthesias[ ] ;Blurred vision [ ] ; Diplopia [ ] ; Vision changes [ ]   Ortho/Skin: Arthritis [ ] ; Joint pain [ ] ; Muscle pain [ ] ; Joint swelling [ ] ; Back Pain [ ] ; Rash [ ]   Psych: Depression[ ] ; Anxiety[ ]   Heme: Bleeding problems [ ] ; Clotting disorders [ ] ; Anemia [ ]   Endocrine: Diabetes [ ] ; Thyroid dysfunction[ ]    Past Medical History:  Diagnosis Date   AAA (abdominal aortic aneurysm) (HCC)    AKI  (acute kidney injury) (HCC)    a. Cr up to 8 in 04/2018.   Arthritis    CAD in native artery    a. h/o MI x 3 s/p CABG x 4 (1993 with Tyrone SageGerhardt). b. LHC 01/2017 with severe native disease, 2 grafts occluded.   Carotid artery disease (HCC)    a. s/p R CEA 11/2016.   Chronic combined systolic and diastolic CHF (congestive heart failure) (HCC)    COPD (chronic obstructive pulmonary disease) (HCC)    GERD (gastroesophageal reflux disease)    Hyperkalemia    ILD (interstitial lung disease) (HCC)    Lung nodule    MI (myocardial infarction) (HCC)    x 3    Normocytic anemia    NSVT (nonsustained ventricular tachycardia) (HCC)    Pancreatitis    1/19   Pneumonia 11/2017   RA (rheumatoid arthritis) (HCC)    Stroke (HCC)    a. 2018. b. possible recurrence in 04/2018, seen by neuro   Tobacco abuse     Current Outpatient Medications  Medication Sig Dispense Refill   acetaminophen (TYLENOL) 500 MG tablet Take 1,000 mg by mouth every 6 (six) hours as needed for mild pain.      albuterol (PROVENTIL HFA) 108 (90 Base) MCG/ACT inhaler Inhale 2 puffs into the lungs every 6 (six) hours as needed for wheezing or shortness of breath. 8 g 0   albuterol (PROVENTIL) (2.5 MG/3ML) 0.083% nebulizer solution Take 3 mLs (2.5 mg total) by nebulization every 6 (six) hours as needed for wheezing or shortness of breath. 75 mL 0   aspirin EC 81 MG tablet Take 81 mg by mouth daily.      atorvastatin (LIPITOR) 40 MG tablet Take 1 tablet (40 mg total) by mouth daily at 6 PM. (Patient taking differently: Take 80 mg by mouth daily at 6 PM. ) 30 tablet 11   buPROPion (WELLBUTRIN SR) 100 MG 12 hr tablet Take 100 mg by mouth 2 (two) times daily.     carvedilol (COREG) 3.125 MG tablet Take 1 tablet (3.125 mg total) by mouth 2 (two) times daily with a meal. 60 tablet 0   cholecalciferol (VITAMIN D3) 25 MCG (1000 UT) tablet Take 1,000 Units by mouth daily.     clopidogrel (PLAVIX) 75 MG tablet Take 1  tablet (75 mg total) by mouth daily with breakfast. 30 tablet 0   furosemide (LASIX) 40 MG tablet Take 1 tablet (40 mg total) by mouth daily. 30 tablet 0   gabapentin (NEURONTIN) 300 MG capsule Take 1 capsule (300 mg total) by mouth every 8 (eight) hours. 90 capsule 4   guaiFENesin (MUCINEX) 600 MG 12 hr tablet Take 600 mg by mouth 2 (two) times daily as needed for cough or to loosen phlegm.  HYDROcodone-acetaminophen (NORCO) 10-325 MG tablet Take 1 tablet by mouth 3 (three) times daily. 15 tablet 0   hydroxychloroquine (PLAQUENIL) 200 MG tablet Take 1 tablet (200 mg total) by mouth 2 (two) times daily.  0   magnesium hydroxide (MILK OF MAGNESIA) 400 MG/5ML suspension Take 30 mLs by mouth daily as needed for mild constipation. And at bedtime     Menthol-Methyl Salicylate (MUSCLE RUB EX) Apply 1 application topically as needed (for shoulder or knee pain).      mirtazapine (REMERON) 7.5 MG tablet Take 7.5 mg by mouth at bedtime.     nutrition supplement, JUVEN, (JUVEN) PACK Take 1 packet by mouth 2 (two) times daily between meals.  0   nystatin cream (MYCOSTATIN) Apply topically 2 (two) times daily. (Patient taking differently: Apply 1 application topically 2 (two) times daily. ) 30 g 0   polyethylene glycol (MIRALAX / GLYCOLAX) packet Take 17 g by mouth 2 (two) times daily. Until having 1 soft BM daily, then can decrease to daily or as needed (Patient taking differently: Take 17 g by mouth daily. Until having 1 soft BM daily, then can decrease to daily or as needed) 14 each 0   potassium chloride SA (K-DUR,KLOR-CON) 20 MEQ tablet Take 2 tablets (40 mEq total) by mouth daily. (Patient taking differently: Take 20 mEq by mouth 2 (two) times daily. )     predniSONE (DELTASONE) 10 MG tablet Taper dose: 60mg  daily x 4 days, then 40mg  po daily x 4 days, then 20mg  po daily x 4 days, then 10mg  po daily x 4 days, then 5mg  po daily x 4 days, then stop. Zero refills.     saccharomyces boulardii  (FLORASTOR) 250 MG capsule Take 1 capsule (250 mg total) by mouth 2 (two) times daily.     spironolactone (ALDACTONE) 25 MG tablet Take 0.5 tablets (12.5 mg total) by mouth at bedtime. 30 tablet 0   tamsulosin (FLOMAX) 0.4 MG CAPS capsule Take 1 capsule (0.4 mg total) by mouth daily after supper. 30 capsule 4   No current facility-administered medications for this encounter.     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  Social Network engineer strain: Not on file   Food insecurity    Worry: Not on file    Inability: Not on file   Transportation needs    Medical: Not on file    Non-medical: Not on file  Tobacco Use   Smoking status: Former Smoker    Packs/day: 1.00    Years: 47.00    Pack years: 47.00    Types: Cigarettes    Quit date: 2019    Years since quitting: 1.8   Smokeless tobacco: Never Used   Tobacco comment: quit 2019  Substance and Sexual Activity   Alcohol use: No    Comment: remote history of beer drinking   Drug use: No   Sexual activity: Not on file  Lifestyle   Physical activity    Days per week: Not on file    Minutes per session: Not on file   Stress: Not on file  Relationships   Social connections    Talks on  phone: Not on file    Gets together: Not on file    Attends religious service: Not on file    Active member of club or organization: Not on file    Attends meetings of clubs or organizations: Not on file    Relationship status: Not on file   Intimate partner violence    Fear of current or ex partner: Not on file    Emotionally abused: Not on file    Physically abused: Not on file    Forced sexual activity: Not on file  Other Topics Concern   Not  on file  Social History Narrative   Not on file      Family History  Problem Relation Age of Onset   Heart murmur Mother    Heart disease Mother    Heart attack Mother    Suicidality Father    Heart attack Father    Stroke Brother    Heart attack Brother    Heart attack Maternal Grandmother    Cancer Maternal Grandmother        unknown type   Lung cancer Maternal Aunt    Lung cancer Maternal Uncle    Cancer Maternal Aunt        unknown cancer   Heart attack Brother     Vitals:   08/29/19 1205  BP: (!) 103/59  Pulse: 63  SpO2: 97%  Weight: 64.7 kg (142 lb 9.6 oz)     PHYSICAL EXAM: General:  Thin/ chronically ill appearing WM. No respiratory difficulty. On supplemental O2 HEENT: normal Neck: supple. no JVD. Carotids 2+ bilat; no bruits. No lymphadenopathy or thyromegaly appreciated. Cor: PMI nondisplaced. Regular rate & rhythm. No rubs, gallops or murmurs. Lungs: bilateral diffuse crackles c/w ILD Abdomen: soft, nontender, nondistended. No hepatosplenomegaly. No bruits or masses. Good bowel sounds. Extremities: no cyanosis, clubbing, rash, edema Neuro: alert & oriented x 3, cranial nerves grossly intact. moves all 4 extremities w/o difficulty. Affect pleasant.  ECG: not performed   ASSESSMENT & PLAN:  1. Chronic Hypoxemic respiratory failure: ILD + systolic HF. On chronic home O2. No increased O2 requirements post discharge. Euvolemic on exam.  -followed by pulmonary -continue steroid taper -continue diuretics   2. Chronic Systolic CHF: Ischemic cardiomyopathy. Recent echo with EF 25-30%, somewhat lower than prior.  Had [facility]. PCWP mildly elevated and RA normal on RHC.  Cardiac output preserved on cath, CI 2.46. Diuresed w/ IV lasix and transitioned to PO Lasix.  -Euvolemic on exam. Continue Lasix 40 mg daily. Check BMP today. 103/59 - Continue Coreg to 3.125 mg bid.  - Ideally, would start ACEI/ARB/ARNI, but BP still low today.  - Continue spiro  12.5 mg  - Continue daily home weighs and low sodium diet.   3. CAD: s/p remote CABG x 3. Cath in 2018, only the RCA and LIMA-LAD remained patent. Recent coronary angiography 10/8 showed a tight RCA stenosis (ongoing patency of LIMA-LAD). S/p RCA intervention w/ DES.  - Stable w/o CP - DAPT w/ ASA and Plavix. Check CBC today - Continue atorvastatin 80 daily.  -  Continue ? blocker  4. ILD:  Recent High resolution CT with some progression of ILD as above.  - Treating with steroids. Management per pulmonology.   5. RA: On Plaquenil.  6. H/o CVA: Now on ASA and Plavix for new DES  7. H/o AAA: 4.6 cm in 10/19. Followed by [facility]. Ordered to get f/u AAA duplex this month. -BP stable. Continue  blocker  8. Carotid stenosis: s/p right CEA.Continue ASA, Plavix + statin.    F/u w/ PharmD in 3 weeks to try to increase HR regimen, if BP allows. F/u with Dr. Aundra Dubin in 6 weeks   Lyda Jester, PA-C 08/29/19

## 2019-08-30 ENCOUNTER — Encounter: Payer: Medicaid Other | Admitting: Internal Medicine

## 2019-08-30 DIAGNOSIS — L89314 Pressure ulcer of right buttock, stage 4: Secondary | ICD-10-CM | POA: Diagnosis not present

## 2019-08-31 ENCOUNTER — Other Ambulatory Visit: Payer: Self-pay

## 2019-08-31 ENCOUNTER — Ambulatory Visit
Payer: Medicaid Other | Attending: Student in an Organized Health Care Education/Training Program | Admitting: Student in an Organized Health Care Education/Training Program

## 2019-08-31 ENCOUNTER — Encounter: Payer: Self-pay | Admitting: Student in an Organized Health Care Education/Training Program

## 2019-08-31 VITALS — BP 107/76 | HR 106 | Temp 98.4°F | Resp 18 | Ht 73.0 in | Wt 157.0 lb

## 2019-08-31 DIAGNOSIS — G629 Polyneuropathy, unspecified: Secondary | ICD-10-CM | POA: Insufficient documentation

## 2019-08-31 DIAGNOSIS — M542 Cervicalgia: Secondary | ICD-10-CM | POA: Diagnosis present

## 2019-08-31 DIAGNOSIS — Z8739 Personal history of other diseases of the musculoskeletal system and connective tissue: Secondary | ICD-10-CM | POA: Insufficient documentation

## 2019-08-31 DIAGNOSIS — G894 Chronic pain syndrome: Secondary | ICD-10-CM | POA: Insufficient documentation

## 2019-08-31 DIAGNOSIS — M19011 Primary osteoarthritis, right shoulder: Secondary | ICD-10-CM | POA: Diagnosis present

## 2019-08-31 MED ORDER — HYDROCODONE-ACETAMINOPHEN 10-325 MG PO TABS: 1.0000 | ORAL_TABLET | Freq: Three times a day (TID) | ORAL | 0 refills | Status: AC | PRN

## 2019-08-31 NOTE — Progress Notes (Signed)
[name] ([id number]) Visit Report for 08/30/2019 HPI Details Patient Name: [name], [name] Date of Service: 08/30/2019 1:00 PM Medical Record Number: [id number] Patient Account Number: [id number] Date of Birth/Sex: 1956-07-11 (62 y.o. M) Treating RN: [name] Primary Care Provider: Sindy [name] Other Clinician: Referring Provider: Sindy [name] Treating Provider/Extender: [name] in Treatment: 13 History of Present Illness HPI Description: ADMISSION 06/01/18 This is a 63 year old man who is sent to [place] currently residing at [facility] skilled facility. He has a complicated recent medical history. His sister accompanies him tells [place] that he has been somewhat disabled since suffering a left basal ganglial CVA in 2018. At that point in time he also had myocardial infarctions. More recently he has been hospitalized from 04/04/18 through 04/12/18 with septic shock, coronary artery disease, congestive heart failure and an acute CVA. During this hospitalization he was noted to have sacral wounds but these were not felt to be the source of infection however the source of the infection was never really determined at that point. I believe he was sent to a skilled facility. He reports that at the hospital on 05/09/18 through 05/18/18. At that point he was noted to have a large abscess of the right buttock. He required a surgical IandD by [name] of general surgery. Culture of this grew Citrobacter,Eikenella and Bacteroides. He had a CT scan of the pelvis that showed an abnormal soft tissue collection just caudal to the right ischial tuberosity measuring 5.9 and by 4.7. This also involved the distal right deep muscles. There was some cortical bone loss on the right ischial tuberosity and there was some concern with osteomyelitis. He was felt to require treatment for osteomyelitis based on clinical and radiographic findings. He did not have an MRI. Past medical history includes;  abdominal aortic aneurysm, carotid stenosis, hypertension, osteoarthritis, interstitial lung disease on chronic steroids, rheumatoid arthritis, congestive heart failure/ischemic cardiomyopathy with an ejection fraction of 35-40% Also noteworthy is on 05/09/18 his albumin was 2.2 on admission to hospital. I don't think this is actually been repeated. The patient states his appetite is good and he is taking supplements 100%. 06/15/18-He is seen in follow up vibration for right buttock ulcer; this is stable in appearance with minimal amount of non-viable tissue. 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 We've been using a wound VAC. This is centered over the right ischial tuberosity. He is making generally good progress. He was severely hypoalbuminemic at one time although he states that he eats well and takes his supplements. 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 We have been using silver collagen over the superior aspect of the right ischial tuberosity under wound VAC.he has made some nice improvements. The dimensions of the wound are certainly smaller. He has no exposed bone. We received lab work from [facility]. His prealbumin was 18.6 and albumin at 2.57. This is obviously still low. I am not sure what interventions are in place. I know he is on supplements. I discussed this with the patient. I told him that the albumin represents already moderate to severe protein malnutrition. This would contribute it to do difficulty healing the wound 07/20/18; I received copies of lab work from [facility] on 07/06/18 documenting a sedimentation rate of 25 which seems to be in line with what I see  from previous values however his C-reactive protein was 110.8 versus 84.7 on 06/22/18. I have looked through [facility] health Link I  don't see a prior value for either inflammatory measure. he has been treated with [name] which I think he is still on for osteomyelitis of the right ischial tuberosity as well as a soft tissue collection just caudad to the right ischial tuberosity itself. Felt to have a soft tissue abscess and/or stigmata of necrotizing fasciitis. I'm not sure who is following this. His CT scan was on 05/09/18 yet he still remains on Invanz. Looking back through the original discharge summary the [name] was supposed to finish on 07/05/18. 08/03/18 the patient continues to make good improvement. The area over the right ischial tuberosity has filled in nicely. His developed some what looks to be candidal skin infection in the periwound. I think they've taken the wound VAC off and are [name], [name] ([id number]) applying nystatin 08/17/18; 2 week follow-up for this resident from [facility]. They resumed the wound VAC. The area continues to fill in nicely. The only problem area is the tissue over the ischial tuberosity itself which needs to come up a bit. For this reason I'm continuing the wound VAC. Otherwise this is doing remarkably well. The patient states that he is eating everything they give him although I don't see a follow-up albumin. 08/31/18; the patient states he had blood work done today. I do not have these results. He claims to be eating well. He continues with a wound VAC and the wound is making nice progress. 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 This is coming quite nicely he still has one [name] that has some depth but I think by the next time we see him in 2 weeks we should be able to move on from the wound VAC. He is still at skilled care at [facility]. As far as I am aware he is eating and drinking well. I have  not seen follow-up albumin studies on him recently but the message I'm getting is that he is eating well and taking his supplements. The patient states he is not very mobile and I am hopeful that we are able to insure pressure relief here however if we hadn't been ensuring pressure relief I doubt this would've closed in as well as it has even with the wound VAC. He does not complain of any systemic issues 09/28/2018 for follow-up and management of stage IV sacral ulcer that is being managed with a wound VAC. He is a resident at a long-term care facility, [facility]. The wound continues to have some depth and width some granulation of tissue on the outer borders of the wound. The wound was tender to touch today during exam. He denies any pain to the wound when it is not being touched. Recent fevers, chills, nausea, vomiting, or shortness of breath. Hopeful over the next couple of weeks he will be able to transition from the wound VAC. 10/19/2018; this patient has a stage IV over the right ischial tuberosity. The wound is come to the surface of the skin and looks very healthy at this point. His wound VAC can be discontinued. The patient apparently is eating well taking his supplements. He has no systemic complaints 11/23/2018. The patient has a stage IV wound over  his right ischial tuberosity that is now down to a quarter sized superficial area. There is no depth to this. The tissue looks healthy. No debridement was required. Continued improvement. We are using silver alginate and bordered foam 2/19; the original stage IV wound over his right ischial tuberosity. Continues to make nice progress in terms of dimensions. There is no depth to this. We have been using silver alginate and border foam 3/11; this is in follow-up of his original stage IV wound over his right ischial tuberosity. This continues to make nice progress using silver alginate and border foam vigorous offloading. He is at [facility] skilled facility. Patient complains of terrible pain in his hands and having trouble getting pain medication which he has to ask for as needed. He apparently has a history of rheumatoid arthritis and I note he is on prednisone and chloroquine. His rheumatologist is at [facility] clinic. 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 This continues to make decent progress is smaller. There is some undermining at roughly 12-3 o'clock. We have been using silver alginate 4/29; patient's wound continues to get smaller. The remaining area is epithelializing. The undermining area still is not epithelialized however. We have been using silver collagen since the last time he is here. He does not come any notes from the facility where he lives [facility]] but he claims to be eating well and vigorously offloading this area 5/20-Patient returns to clinic for right ischial wound. No notes available from facility, wound appears to be improving, we have been using Prisma and he has been offloading this area while using his wheelchair in fact he has started to use the walker again READMISSION 05/31/2019 This is a patient we cared for her for a prolonged period of time with a stage IV pressure area over his right ischio tuberosity. 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 We were able to get this to markedly improve with a wound VAC over time. We last saw this patient in clinic on 5/20 we are using silver collagen and a small wound that was just about closed. Per the patient this wound actually did progress to closure although I do not have any independent verification of this from [facility] where he lives. He states that this reopened about 2 to 3 months ago. His timeframe is off however again I do not have any independent verification. He has a small wound with a lip lip of tissue over the top of  this 8/26; wound is down to a very tiny open area about slight hyper granulation. The area is healing with epithelialization and a bit of a divot. I used silver nitrate. Continue with Hydrofera Blue 9/23; patient arrives today with the wound quite a bit worse. There is a lip of skin and subcutaneous tissue over the majority of the visible part of this wound however this is fairly deep underneath this with exposed bone. At one point he had a large stage IV wound and this with underlying osteomyelitis. We have been using Hydrofera Blue. [name], [name] ([id number]) Also, the patient is being discharged in 8 days to home to live with his sister in [place]. There will be a lot of arrangements here. I counseled the patient that he is absolutely going to have to be rigorous and offloading this area. I change the primary dressing to silver alginate  10/14; this is a patient with a prior history of a stage IV pressure ulcer over the right ischial tuberosity. He was treated with 6 weeks worth of IV antibiotics and he was at [facility] skilled facility. This almost closed although the last time he was here there was some more depth and probing to bone. Since then on 10/1 he was discharged to home where he is being cared for by his sister. He was hospitalized from 08/07/2019 through 08/13/2019 with acute on chronic respiratory failure secondary to known interstitial lung disease and chronic combined congestive heart failure. His echocardiogram showed an ejection fraction of 20 to 25% he had PCI done with a stent of the right coronary artery. He is returned home and his sister is with him today. She reports that the wound is a lot worse since his hospitalization. There is clearly large areas of exposed bone in the area is a lot larger some tenderness they have been using silver alginate. He apparently is eating better. He is not incontinent able to get up and transfer himself. He has a recliner but he is  sleeping in the bed 10/28. Continued deterioration in this wound now with a wide area of exposed bone. Swab culture I did last time showed a few Pseudomonas I gave him 7 days of cefdinir. However with continued deterioration today I think he needs to be reevaluated for recurrence of the underlying acute on chronic osteomyelitis. I have ordered a CT scan as I think it would be difficult to get him through an MRI. They did a CT scan to diagnosed as the last time. I am going to bring him back next week for a bone biopsy and culture Electronic Signature(s) Signed: 08/30/2019 5:54:02 PM By: Baltazar Najjarobson, Michael MD Entered By: Baltazar Najjarobson, Michael on 08/30/2019 13:50:29 Rady, Loreli SlotPHILLIP W. ([id number]) -------------------------------------------------------------------------------- Physical Exam Details Patient Name: Ryan CarrowMOORE, [name] Date of Service: 08/30/2019 1:00 PM Medical Record Number: [id number] Patient Account Number: [id number] Date of Birth/Sex: 1956/05/10 (62 y.o. M) Treating RN: Ryan CoventryWoody, Kim Primary Care Provider: Sindy MessingGOMEZ, ROGER Other Clinician: Referring Provider: Sindy MessingGOMEZ, ROGER Treating Provider/Extender: Maxwell CaulOBSON, MICHAEL G Weeks in Treatment: 13 Constitutional Sitting or standing Blood Pressure is within target range for patient.. Pulse regular and within target range for patient.[name]. Respirations regular, non-labored and within target range.. Temperature is normal and within the target range for the patient.[name]. appears in no distress. Somewhat gaunt appearing man. Eyes Conjunctivae clear. No discharge. Respiratory Respiratory effort is easy and symmetric bilaterally. Rate is normal at rest and on room air.. Gastrointestinal (GI) Abdomen is soft and non-distended without masses or tenderness. Bowel sounds active in all quadrants.. No liver or spleen enlargement or tenderness.. Genitourinary (GU) No bladder distention. Integumentary (Hair, Skin) There is no erythema around the  wound. Psychiatric No evidence of depression, anxiety, or agitation. Calm, cooperative, and communicative. Appropriate interactions and affect.. Notes Wound exam; the wound is considerably larger with wide areas of exposed bone. Fortunately there is no periwound infection no crepitus Electronic Signature(s) Signed: 08/30/2019 5:54:02 PM By: Baltazar Najjarobson, Michael MD Entered By: Baltazar Najjarobson, Michael on 08/30/2019 13:51:45 Hamid, Loreli SlotPHILLIP W. ([id number]) -------------------------------------------------------------------------------- Physician Orders Details Patient Name: Ryan CarrowMOORE, [name] Date of Service: 08/30/2019 1:00 PM Medical Record Number: [id number] Patient Account Number: [id number] Date of Birth/Sex: 1956/05/10 (62 y.o. M) Treating RN: Ryan CoventryWoody, Kim Primary Care Provider: Sindy MessingGOMEZ, ROGER Other Clinician: Referring Provider: Sindy MessingGOMEZ, ROGER Treating Provider/Extender: Ryan CarolinaOBSON, MICHAEL G Weeks in Treatment: 13 Verbal / Phone Orders: No Diagnosis Coding Wound Cleansing Wound #2  Right Gluteus o Clean wound with Normal Saline. o Cleanse wound with mild soap and water Anesthetic (add to Medication List) Wound #2 Right Gluteus o Topical Lidocaine 4% cream applied to wound bed prior to debridement (In Clinic Only). Skin Barriers/Peri-Wound Care Wound #2 Right Gluteus o Skin Prep - to peri-wound Primary Wound Dressing Wound #2 Right Gluteus o Silver Alginate - packed lighty Secondary Dressing Wound #2 Right Gluteus o Boardered Foam Dressing Dressing Change Frequency Wound #2 Right Gluteus o Change dressing every day. - Monday, Wednesday, Friday by home health. May skip Wednesday if patient has wound care visit. Homehealth, please train patient's sister for additional days. Follow-up Appointments Wound #2 Right Gluteus o Return Appointment in 1 week. Off-Loading Wound #2 Right Gluteus o Mattress - Order bed/air mattress-please notify wound care center [phone] if we need  to order bed and mattress for the patient. o Turn and reposition every 2 hours Home Health Wound #2 Right Gluteus o Continue Home Health Visits - [facility] bed with air mattress. If wound care needs to order bed and mattress, please let us know [phone]. o Home Health Nurse may visit PRN to address patientos wound care needs. [name], [name] ([id number]) o FACE TO FACE ENCOUNTER: MEDICARE and MEDICAID PATIENTS: I certify that this patient is under my care and that I had a face-to-face encounter that meets the physician face-to-face encounter requirements with this patient on this date. The encounter with the patient was in whole or in part for the following MEDICAL CONDITION: (primary reason for Home Healthcare) MEDICAL NECESSITY: I certify, that based on my findings, NURSING services are a medically necessary home health service. HOME BOUND STATUS: I certify that my clinical findings support that this patient is homebound (i.e., Due to illness or injury, pt requires aid of supportive devices such as crutches, cane, wheelchairs, walkers, the use of special transportation or the assistance of another person to leave their place of residence. There is a normal inability to leave the home and doing so requires considerable and taxing effort. Other absences are for medical reasons / religious services and are infrequent or of short duration when for other reasons). o If current dressing causes regression in wound condition, may D/C ordered dressing product/s and apply Normal Saline Moist Dressing daily until next Wound Healing Center / Other MD appointment. Notify Wound Healing Center of regression in wound condition at [phone]. o Please direct any NON-WOUND related issues/requests for orders to patient's Primary Care Physician Medications-please add to medication list. Wound #2 Right Gluteus o Other: - Nutritional supplement daily Electronic  Signature(s) Signed: 08/30/2019 5:54:02 PM By: Baltazar Najjar MD Signed: 08/31/2019 10:04:30 AM By: Elliot Gurney, BSN, RN, CWS, Kim RN, BSN Entered By: Elliot Gurney, BSN, RN, CWS, Kim on 08/30/2019 13:37:44 [name], [name] ([id number]) -------------------------------------------------------------------------------- Prescription 08/30/2019 Patient Name: [name] Provider: Maxwell Caul MD Date of Birth: [birth year] NPI#: [id number] Sex: Judie Petit DEA#: [id number] Phone #: [phone] License #: [id number] Patient Address: [facility] [address] [place], [place] [address] [address] [place], [place] [address] [phone] Allergies No Known Allergies Provider's Orders Mattress - Order bed/air mattress-please notify wound care center [phone] if we need to order bed and mattress for the patient. Signature(s): Date(s): Psychologist, prison and probation services) Signed: 08/30/2019 5:54:02 PM By: Baltazar Najjar MD Signed: 08/31/2019 10:04:30 AM By: Elliot Gurney, BSN, RN, CWS, Kim RN, BSN Entered By: Elliot Gurney, BSN, RN, CWS,  Kim on 08/30/2019 13:37:45 Holte, Myrle W. ([id number]) --------------------------------------------------------------------------------  Problem List Details Patient Name: [name], [name] Date of Service: 08/30/2019 1:00 PM Medical Record Number: [id number] Patient Account Number: [id number] Date of Birth/Sex: 05-04-1956 (62 y.o. M) Treating RN: [name] Primary Care Provider: Sindy [name] Other Clinician: Referring Provider: Sindy [name] Treating Provider/Extender: Ryan Chatsworth in Treatment: 13 Active Problems ICD-10 Evaluated Encounter Code Description Active Date Today Diagnosis L89.314 Pressure ulcer of right buttock, stage 4 05/31/2019 No Yes M86.68 Other chronic osteomyelitis, other site 08/30/2019 No Yes Inactive Problems Resolved Problems Electronic  Signature(s) Signed: 08/30/2019 5:54:02 PM By: Baltazar Najjar MD Entered By: Baltazar Najjar on 08/30/2019 13:48:40 Weyman, Winnie W. ([id number]) -------------------------------------------------------------------------------- Progress Note Details Patient Name: [name] Date of Service: 08/30/2019 1:00 PM Medical Record Number: [id number] Patient Account Number: [id number] Date of Birth/Sex: 05/14/1956 (62 y.o. M) Treating RN: [name] Primary Care Provider: Sindy [name] Other Clinician: Referring Provider: Sindy [name] Treating Provider/Extender: Ryan Dunsmuir in Treatment: 13 Subjective History of Present Illness (HPI) ADMISSION 06/01/18 This is a 63 year old man who is sent to [place] currently residing at [facility] skilled facility. He has a complicated recent medical history. His sister accompanies him tells [place] that he has been somewhat disabled since suffering a left basal ganglial CVA in 2018. At that point in time he also had myocardial infarctions. More recently he has been hospitalized from 04/04/18 through 04/12/18 with septic shock, coronary artery disease, congestive heart failure and an acute CVA. During this hospitalization he was noted to have sacral wounds but these were not felt to be the source of infection however the source of the infection was never really determined at that point. I believe he was sent to a skilled facility. He reports that at the hospital on 05/09/18 through 05/18/18. At that point he was noted to have a large abscess of the right buttock. He required a surgical IandD by [name] of general surgery. Culture of this grew Citrobacter,Eikenella and Bacteroides. He had a CT scan of the pelvis that showed an abnormal soft tissue collection just caudal to the right ischial tuberosity measuring 5.9 and by 4.7. This also involved the distal right deep muscles. There was some cortical bone loss on the right ischial tuberosity and there was  some concern with osteomyelitis. He was felt to require treatment for osteomyelitis based on clinical and radiographic findings. He did not have an MRI. Past medical history includes; abdominal aortic aneurysm, carotid stenosis, hypertension, osteoarthritis, interstitial lung disease on chronic steroids, rheumatoid arthritis, congestive heart failure/ischemic cardiomyopathy with an ejection fraction of 35-40% Also noteworthy is on 05/09/18 his albumin was 2.2 on admission to hospital. I don't think this is actually been repeated. The patient states his appetite is good and he is taking supplements 100%. 06/15/18-He is seen in follow up vibration for right buttock ulcer; this is stable in appearance with minimal amount of non-viable tissue. 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 We've been using a wound VAC. This is centered over the right ischial tuberosity. He is making generally good progress. He was severely hypoalbuminemic at one time although he states that he eats well and takes his supplements. 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 We have been using silver collagen over the superior aspect of the right ischial  tuberosity under wound VAC.he has made some nice improvements. The dimensions of the wound are certainly smaller. He has no exposed bone. We received lab work from [facility]. His prealbumin was 18.6 and albumin at 2.57. This is obviously still low. I am not sure what interventions are in place. I know he is on supplements. I discussed this with the patient. I told him that the albumin represents already moderate to severe protein malnutrition. This would contribute it to do difficulty healing the wound 07/20/18; I received copies of lab work from [facility] on 07/06/18 documenting a  sedimentation rate of 25 which seems to be in line with what I see from previous values however his C-reactive protein was 110.8 versus 84.7 on 06/22/18. I have looked through [facility] health Link I don't see a prior value for either inflammatory measure. he has been treated with [name] which I think he is still on for osteomyelitis of the right ischial tuberosity as well as a soft tissue collection just caudad to the right ischial tuberosity itself. Felt to have a soft tissue abscess and/or stigmata of necrotizing fasciitis. I'm not sure who is following this. His CT scan was on 05/09/18 yet he still remains on Invanz. Looking back through the original discharge summary the [name] was supposed to finish on 07/05/18. 08/03/18 the patient continues to make good improvement. The area over the right ischial tuberosity has filled in nicely. His developed some what looks to be candidal skin infection in the periwound. I think they've taken the wound VAC off and are applying nystatin 08/17/18; 2 week follow-up for this resident from [facility]. They resumed the wound VAC. The area continues to fill in nicely. The only problem area is the tissue over the ischial tuberosity itself which needs to come up a bit. For this reason Theordore Cisnero, Korion W. ([id number]) continuing the wound VAC. Otherwise this is doing remarkably well. The patient states that he is eating everything they give him although I don't see a follow-up albumin. 08/31/18; the patient states he had blood work done today. I do not have these results. He claims to be eating well. He continues with a wound VAC and the wound is making nice progress. 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 This is coming quite nicely he still has one [name] that has some depth but I think  by the next time we see him in 2 weeks we should be able to move on from the wound VAC. He is still at skilled care at [facility]. As far as I am aware he is eating and drinking well. I have not seen follow-up albumin studies on him recently but the message I'm getting is that he is eating well and taking his supplements. The patient states he is not very mobile and I am hopeful that we are able to insure pressure relief here however if we hadn't been ensuring pressure relief I doubt this would've closed in as well as it has even with the wound VAC. He does not complain of any systemic issues 09/28/2018 for follow-up and management of stage IV sacral ulcer that is being managed with a wound VAC. He is a resident at a long-term care facility, [facility]. The wound continues to have some depth and width some granulation of  tissue on the outer borders of the wound. The wound was tender to touch today during exam. He denies any pain to the wound when it is not being touched. Recent fevers, chills, nausea, vomiting, or shortness of breath. Hopeful over the next couple of weeks he will be able to transition from the wound VAC. 10/19/2018; this patient has a stage IV over the right ischial tuberosity. The wound is come to the surface of the skin and looks very healthy at this point. His wound VAC can be discontinued. The patient apparently is eating well taking his supplements. He has no systemic complaints 11/23/2018. The patient has a stage IV wound over his right ischial tuberosity that is now down to a quarter sized superficial area. There is no depth to this. The tissue looks healthy. No debridement was required. Continued improvement. We are using silver alginate and bordered foam 2/19; the original stage IV wound over his right ischial tuberosity. Continues to make nice progress in terms of dimensions. There is no depth to this. We have been using silver alginate and border foam 3/11; this is  in follow-up of his original stage IV wound over his right ischial tuberosity. This continues to make nice progress using silver alginate and border foam vigorous offloading. He is at [facility] skilled facility. Patient complains of terrible pain in his hands and having trouble getting pain medication which he has to ask for as needed. He apparently has a history of rheumatoid arthritis and I note he is on prednisone and chloroquine. His rheumatologist is at [facility] clinic. 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 This continues to make decent progress is smaller. There is some undermining at roughly 12-3 o'clock. We have been using silver alginate 4/29; patient's wound continues to get smaller. The remaining area is epithelializing. The undermining area still is not epithelialized however. We have been using silver collagen since the last time he is here. He does not come any notes from the facility where he lives [facility]] but he claims to be eating well and vigorously offloading this area 5/20-Patient returns to clinic for right ischial wound. No notes available from facility, wound appears to be improving, we have been using Prisma and he has been offloading this area while using his wheelchair in fact he has started to use the walker again READMISSION 05/31/2019 This is a patient we cared for her for a prolonged period of time with a stage IV pressure area over his right ischio tuberosity. 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 We were able to get this to markedly improve with a wound VAC over time. We last saw this patient in clinic on 5/20 we are using silver collagen and a small wound that was just about closed. Per the patient this wound actually did progress to closure although I do not have any independent verification of this from [facility] where he lives. He  states that this reopened about 2 to 3 months ago. His timeframe is off however again I do not have any independent verification. He has a small wound with a lip lip of tissue over the top of this 8/26; wound is down to a very tiny open area about slight hyper granulation. The area is healing with epithelialization and a bit of a divot. I used silver nitrate. Continue with Hydrofera Blue 9/23; patient arrives  today with the wound quite a bit worse. There is a lip of skin and subcutaneous tissue over the majority of the visible part of this wound however this is fairly deep underneath this with exposed bone. At one point he had a large stage IV wound and this with underlying osteomyelitis. We have been using Hydrofera Blue. Also, the patient is being discharged in 8 days to home to live with his sister in [place]. There will be a lot of arrangements here. I counseled the patient that he is absolutely going to have to be rigorous and offloading this area. I change the primary dressing to silver alginate [name], [name] 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 He was treated with 6 weeks worth of IV antibiotics and he was at [facility] skilled facility. This almost closed although the last time he was here there was some more depth and probing to bone. Since then on 10/1 he was discharged to home where he is being cared for by his sister. He was hospitalized from 08/07/2019 through 08/13/2019 with acute on chronic respiratory failure secondary to known interstitial lung disease and chronic combined congestive heart failure. His echocardiogram showed an ejection fraction of 20 to 25% he had PCI done with a stent of the right coronary artery. He is returned home and his sister is with him today. She reports that the wound is a lot worse since his hospitalization. There is clearly large areas of exposed bone in the  area is a lot larger some tenderness they have been using silver alginate. He apparently is eating better. He is not incontinent able to get up and transfer himself. He has a recliner but he is sleeping in the bed 10/28. Continued deterioration in this wound now with a wide area of exposed bone. Swab culture I did last time showed a few Pseudomonas I gave him 7 days of cefdinir. However with continued deterioration today I think he needs to be reevaluated for recurrence of the underlying acute on chronic osteomyelitis. I have ordered a CT scan as I think it would be difficult to get him through an MRI. They did a CT scan to diagnosed as the last time. I am going to bring him back next week for a bone biopsy and culture Objective Constitutional Sitting or standing Blood Pressure is within target range for patient.. Pulse regular and within target range for patient.[name] Respirations regular, non-labored and within target range.. Temperature is normal and within the target range for the patient.[name] appears in no distress. Somewhat gaunt appearing man. Vitals Time Taken: 12:50 PM, Height: 73 in, Weight: 155 lbs, BMI: 20.4, Temperature: 97.5 F, Pulse: 114 bpm, Respiratory Rate: 20 breaths/min, Blood Pressure: 101/87 mmHg. Eyes Conjunctivae clear. No discharge. Respiratory Respiratory effort is easy and symmetric bilaterally. Rate is normal at rest and on room air.. Gastrointestinal (GI) Abdomen is soft and non-distended without masses or tenderness. Bowel sounds active in all quadrants.. No liver or spleen enlargement or tenderness.. Genitourinary (GU) No bladder distention. Psychiatric No evidence of depression, anxiety, or agitation. Calm, cooperative, and communicative. Appropriate interactions and affect.. General Notes: Wound exam; the wound is considerably larger with wide areas of exposed bone. Fortunately there is no periwound infection no crepitus Integumentary (Hair, Skin) There is no  erythema around the wound. [name], [name] ([id number]) Wound #2 status is Open. Original cause of wound was Pressure Injury. The wound  is located on the Right Gluteus. The wound measures 4cm length x 3.4cm width x 1cm depth; 10.681cm^2 area and 10.681cm^3 volume. There is bone and Fat Layer (Subcutaneous Tissue) Exposed exposed. There is no tunneling noted, however, there is undermining starting at 12:00 and ending at 7:00 with a maximum distance of 1.6cm. There is a medium amount of serous drainage noted. The wound margin is epibole. There is small (1-33%) pink granulation within the wound bed. There is a large (67-100%) amount of necrotic tissue within the wound bed including Adherent Slough. Assessment Active Problems ICD-10 Pressure ulcer of right buttock, stage 4 Other chronic osteomyelitis, other site Plan Wound Cleansing: Wound #2 Right Gluteus: Clean wound with Normal Saline. Cleanse wound with mild soap and water Anesthetic (add to Medication List): Wound #2 Right Gluteus: Topical Lidocaine 4% cream applied to wound bed prior to debridement (In Clinic Only). Skin Barriers/Peri-Wound Care: Wound #2 Right Gluteus: Skin Prep - to peri-wound Primary Wound Dressing: Wound #2 Right Gluteus: Silver Alginate - packed lighty Secondary Dressing: Wound #2 Right Gluteus: Boardered Foam Dressing Dressing Change Frequency: Wound #2 Right Gluteus: Change dressing every day. - Monday, Wednesday, Friday by home health. May skip Wednesday if patient has wound care visit. Homehealth, please train patient's sister for additional days. Follow-up Appointments: Wound #2 Right Gluteus: Return Appointment in 1 week. Off-Loading: Wound #2 Right Gluteus: Mattress - Order bed/air mattress-please notify wound care center [phone] if we need to order bed and mattress for the patient. Turn and reposition every 2 hours Home Health: Wound #2 Right Gluteus: Continue Home Health Visits -  [facility]- Order Hospital bed with air mattress. If wound care needs to order bed and mattress, please let us know [phone]. Ryan CarrowMOORE, Winfrey W. ([id number]) Home Health Nurse may visit PRN to address patient s wound care needs. FACE TO FACE ENCOUNTER: MEDICARE and MEDICAID PATIENTS: I certify that this patient is under my care and that I had a face-to-face encounter that meets the physician face-to-face encounter requirements with this patient on this date. The encounter with the patient was in whole or in part for the following MEDICAL CONDITION: (primary reason for Home Healthcare) MEDICAL NECESSITY: I certify, that based on my findings, NURSING services are a medically necessary home health service. HOME BOUND STATUS: I certify that my clinical findings support that this patient is homebound (i.e., Due to illness or injury, pt requires aid of supportive devices such as crutches, cane, wheelchairs, walkers, the use of special transportation or the assistance of another person to leave their place of residence. There is a normal inability to leave the home and doing so requires considerable and taxing effort. Other absences are for medical reasons / religious services and are infrequent or of short duration when for other reasons). If current dressing causes regression in wound condition, may D/C ordered dressing product/s and apply Normal Saline Moist Dressing daily until next Wound Healing Center / Other MD appointment. Notify Wound Healing Center of regression in wound condition at [phone]. Please direct any NON-WOUND related issues/requests for orders to patient's Primary Care Physician Medications-please add to medication list.: Wound #2 Right Gluteus: Other: - Nutritional supplement daily 1. The patient will require imaging of the area over the right gluteus and I have ordered a CT scan. I am going to bring him back early next week for a bone biopsy for pathology and  bone culture. If this is positive you will need to see infectious disease. Electronic Signature(s) Signed:  08/30/2019 5:54:02 PM By: Baltazar Najjar MD Entered By: Baltazar Najjar on 08/30/2019 13:55:09 Meiner, [name] ([id number]) -------------------------------------------------------------------------------- SuperBill Details Patient Name: CLEVER, GERALDO. Date of Service: 08/30/2019 Medical Record Number: [id number] Patient Account Number: [id number] Date of Birth/Sex: [birth year] (63 y.o. M) Treating RN: [name] Primary Care Provider: Sindy [name] Other Clinician: Referring Provider: Sindy [name] Treating Provider/Extender: Ryan Orofino in Treatment: 13 Diagnosis Coding ICD-10 Codes Code Description L89.314 Pressure ulcer of right buttock, stage 4 M86.68 Other chronic osteomyelitis, other site Facility Procedures CPT4 Code: [id number] Description: 99213 - WOUND CARE VISIT-LEV 3 EST PT Modifier: Quantity: 1 Physician Procedures CPT4 Code: [id number] Description: 99213 - WC PHYS LEVEL 3 - EST PT ICD-10 Diagnosis Description L89.314 Pressure ulcer of right buttock, stage 4 M86.68 Other chronic osteomyelitis, other site Modifier: Quantity: 1 Electronic Signature(s) Signed: 08/30/2019 5:54:02 PM By: Baltazar Najjar MD Entered By: Baltazar Najjar on 08/30/2019 13:55:39

## 2019-08-31 NOTE — Progress Notes (Signed)
[name], Rein W. ([id number]) Visit Report for 08/30/2019 Arrival Information Details Patient Name: [name], [name] Date of Service: 08/30/2019 1:00 PM Medical Record Number: [id number] Patient Account Number: [id number] Date of Birth/Sex: 1955-11-18 (63 y.o. M) Treating RN: Arnette NorrisBiell, Kristina Primary Care Darcell Yacoub: Sindy MessingGOMEZ, ROGER Other Clinician: Referring Aidian Salomon: Sindy MessingGOMEZ, ROGER Treating Welda Azzarello/Extender: Altamese CarolinaOBSON, MICHAEL G Weeks in Treatment: 13 Visit Information History Since Last Visit Added or deleted any medications: No Patient Arrived: Wheel Chair Any new allergies or adverse reactions: No Arrival Time: 12:50 Had a fall or experienced change in No Accompanied By: daughter activities of daily living that may affect Transfer Assistance: EasyPivot Patient risk of falls: Lift Signs or symptoms of abuse/neglect since last visito No Patient Identification Verified: Yes Hospitalized since last visit: No Secondary Verification Process Yes Has Dressing in Place as Prescribed: Yes Completed: Pain Present Now: Yes Electronic Signature(s) Signed: 08/30/2019 4:41:01 PM By: Arnette NorrisBiell, Kristina Entered By: Arnette NorrisBiell, Kristina on 08/30/2019 12:51:00 Bold, Loreli SlotPHILLIP W. ([id number]) -------------------------------------------------------------------------------- Clinic Level of Care Assessment Details Patient Name: [name], [name] Date of Service: 08/30/2019 1:00 PM Medical Record Number: [id number] Patient Account Number: [id number] Date of Birth/Sex: 1955-11-18 (63 y.o. M) Treating RN: Huel CoventryWoody, Kim Primary Care Niajah Sipos: Sindy MessingGOMEZ, ROGER Other Clinician: Referring Flynn Lininger: Sindy MessingGOMEZ, ROGER Treating Vail Vuncannon/Extender: Altamese CarolinaOBSON, MICHAEL G Weeks in Treatment: 13 Clinic Level of Care Assessment Items TOOL 4 Quantity Score []  - Use when only an EandM is performed on FOLLOW-UP visit 0 ASSESSMENTS - Nursing Assessment / Reassessment []  - Reassessment of Co-morbidities (includes updates in patient status)  0 X- 1 5 Reassessment of Adherence to Treatment Plan ASSESSMENTS - Wound and Skin Assessment / Reassessment X - Simple Wound Assessment / Reassessment - one wound 1 5 []  - 0 Complex Wound Assessment / Reassessment - multiple wounds []  - 0 Dermatologic / Skin Assessment (not related to wound area) ASSESSMENTS - Focused Assessment []  - Circumferential Edema Measurements - multi extremities 0 []  - 0 Nutritional Assessment / Counseling / Intervention []  - 0 Lower Extremity Assessment (monofilament, tuning fork, pulses) []  - 0 Peripheral Arterial Disease Assessment (using hand held doppler) ASSESSMENTS - Ostomy and/or Continence Assessment and Care []  - Incontinence Assessment and Management 0 []  - 0 Ostomy Care Assessment and Management (repouching, etc.) PROCESS - Coordination of Care X - Simple Patient / Family Education for ongoing care 1 15 []  - 0 Complex (extensive) Patient / Family Education for ongoing care X- 1 10 Staff obtains ChiropractorConsents, Records, Test Results / Process Orders []  - 0 Staff telephones HHA, Nursing Homes / Clarify orders / etc []  - 0 Routine Transfer to another Facility (non-emergent condition) []  - 0 Routine Hospital Admission (non-emergent condition) []  - 0 New Admissions / Manufacturing engineernsurance Authorizations / Ordering NPWT, Apligraf, etc. []  - 0 Emergency Hospital Admission (emergent condition) X- 1 10 Simple Discharge Coordination Christell ConstantMOORE, Mykale W. ([id number]) []  - 0 Complex (extensive) Discharge Coordination PROCESS - Special Needs []  - Pediatric / Minor Patient Management 0 []  - 0 Isolation Patient Management []  - 0 Hearing / Language / Visual special needs []  - 0 Assessment of Community assistance (transportation, D/C planning, etc.) []  - 0 Additional assistance / Altered mentation []  - 0 Support Surface(s) Assessment (bed, cushion, seat, etc.) INTERVENTIONS - Wound Cleansing / Measurement X - Simple Wound Cleansing - one wound 1 5 []  -  0 Complex Wound Cleansing - multiple wounds X- 1 5 Wound Imaging (photographs - any number of wounds) []  - 0 Wound Tracing (instead of photographs) X- 1 5  Simple Wound Measurement - one wound []  - 0 Complex Wound Measurement - multiple wounds INTERVENTIONS - Wound Dressings []  - Small Wound Dressing one or multiple wounds 0 X- 1 15 Medium Wound Dressing one or multiple wounds []  - 0 Large Wound Dressing one or multiple wounds []  - 0 Application of Medications - topical []  - 0 Application of Medications - injection INTERVENTIONS - Miscellaneous []  - External ear exam 0 []  - 0 Specimen Collection (cultures, biopsies, blood, body fluids, etc.) []  - 0 Specimen(s) / Culture(s) sent or taken to Lab for analysis []  - 0 Patient Transfer (multiple staff / / Similar devices) []  - 0 Simple Staple / Suture removal (25 or less) []  - 0 Complex Staple / Suture removal (26 or more) []  - 0 Hypo / Hyperglycemic Management (close monitor of Blood Glucose) []  - 0 Ankle / Brachial Index (ABI) - do not check if billed separately X- 1 5 Vital Signs Saltz, Leomar W. ( ) Has the patient been seen at the hospital within the last three years: Yes Total Score: 80 Level Of Care: New/Established - Level 3 Electronic Signature(s) Signed: 08/31/2019 10:04:30 AM By: , BSN, RN, CWS, Kim RN, BSN Entered By: , BSN, RN, CWS, Kim on 08/30/2019 13:38:31 Neuzil, ( ) -------------------------------------------------------------------------------- Lower Extremity Assessment Details Patient Name: [name], [name] Date of Service: 08/30/2019 1:00 PM Medical Record Number: Patient Account Number: Date of Birth/Sex: [birth year] (63 y.o. M) Treating RN: 09/02/2019 Primary Care Laynee Lockamy: Elliot Gurney Other Clinician: Referring Lindsie Simar: Elliot Gurney Treating Zoltan Genest/Extender: 09/01/2019 Weeks in Treatment: 13 Electronic  Signature(s) Signed: 08/30/2019 4:41:01 PM By: [id number] Entered By: Marice Potter on 08/30/2019 12:53:18 Boroff, Urbano W. (09/01/2019) -------------------------------------------------------------------------------- Multi Wound Chart Details Patient Name: [id number]. Date of Service: 08/30/2019 1:00 PM Medical Record Number: 10/20/1956 Patient Account Number: [id number] Date of Birth/Sex: Aug 13, 1956 (63 y.o. M) Treating RN: Sindy Messing Primary Care Kamala Kolton: Maxwell Caul Other Clinician: Referring Lattie Riege: 09/01/2019 Treating [place]anequa Kretz/Extender: Arnette Norris in Treatment: 13 Vital Signs Height(in): 73 Pulse(bpm): 114 Weight(lbs): 155 Blood Pressure(mmHg): 101/87 Body Mass Index(BMI): 20 Temperature(F): 97.5 Respiratory Rate 20 (breaths/min): Photos: [N/A:N/A] Wound Location: Right Gluteus N/A N/A Wounding Event: Pressure Injury N/A N/A Primary Etiology: Pressure Ulcer N/A N/A Comorbid History: Cataracts, Congestive Heart N/A N/A Failure, Coronary Artery Disease, Hypertension, Myocardial Infarction, History of pressure wounds, Rheumatoid Arthritis Date Acquired: 05/03/2019 N/A N/A Weeks of Treatment: 13 N/A N/A Wound Status: Open N/A N/A Measurements L x W x D 4x3.4x1 N/A N/A (cm) Area (cm) : 10.681 N/A N/A Volume (cm) : 10.681 N/A N/A % Reduction in Area: -2167.70% N/A N/A % Reduction in Volume: -11262.80% N/A N/A Starting Position 1 12 (o'clock): Ending Position 1 7 (o'clock): Maximum Distance 1 (cm): 1.6 Undermining: Yes N/A N/A Classification: Category/Stage IV N/A N/A Exudate Amount: Medium N/A N/A Exudate Type: Serous N/A N/A Exudate Color: amber N/A N/A Wound Margin: Epibole N/A N/A BRODEN, HOLT. ([id number]) Granulation Amount: Small (1-33%) N/A N/A Granulation Quality: Pink N/A N/A Necrotic Amount: Large (67-100%) N/A N/A Exposed Structures: Fat Layer (Subcutaneous N/A N/A Tissue) Exposed: Yes Bone: Yes Fascia:  No Tendon: No Muscle: No Joint: No Epithelialization: Small (1-33%) N/A N/A Treatment Notes Electronic Signature(s) Signed: 08/30/2019 5:54:02 PM By: 09/01/2019 MD Entered By: [id number] on 08/30/2019 13:48:49 Lambert, 10/20/1956 (06-11-2001) -------------------------------------------------------------------------------- Multi-Disciplinary Care Plan Details Patient Name: DRAKE, WUERTZ. Date of Service: 08/30/2019 1:00 PM Medical Record Number: Sindy Messing Patient  Account Number: [id number] Date of Birth/Sex: 06/21/1956 (63 y.o. M) Treating RN: Cornell Barman Primary Care Breonna Gafford: Domenica Fail Other Clinician: Referring Evony Rezek: Domenica Fail Treating Ashleah Valtierra/Extender: Tito Dine in Treatment: 13 Active Inactive Pressure Nursing Diagnoses: Knowledge deficit related to causes and risk factors for pressure ulcer development Knowledge deficit related to management of pressures ulcers Goals: Patient/caregiver will verbalize risk factors for pressure ulcer development Date Initiated: 05/31/2019 Target Resolution Date: 07/01/2019 Goal Status: Active Interventions: Assess: immobility, friction, shearing, incontinence upon admission and as needed Assess offloading mechanisms upon admission and as needed Assess potential for pressure ulcer upon admission and as needed Provide education on pressure ulcers Notes: Wound/Skin Impairment Nursing Diagnoses: Impaired tissue integrity Goals: Ulcer/skin breakdown will have a volume reduction of 30% by week 4 Date Initiated: 05/31/2019 Target Resolution Date: 07/01/2019 Goal Status: Active Interventions: Assess patient/caregiver ability to perform ulcer/skin care regimen upon admission and as needed Assess ulceration(s) every visit Notes: Electronic Signature(s) Signed: 08/31/2019 10:04:30 AM By: Gretta Cool, BSN, RN, CWS, Kim RN, BSN Entered By: Gretta Cool, BSN, RN, CWS, Kim on 08/30/2019 13:30:09 Diona Fanti  ([id number]) -------------------------------------------------------------------------------- Pain Assessment Details Patient Name: Diona Fanti. Date of Service: 08/30/2019 1:00 PM Medical Record Number: [id number] Patient Account Number: [id number] Date of Birth/Sex: [birth year] (63 y.o. M) Treating RN: Harold Barban Primary Care Ankush Gintz: Domenica Fail Other Clinician: Referring Kaity Pitstick: Domenica Fail Treating Dwana Garin/Extender: Tito Dine in Treatment: 13 Active Problems Location of Pain Severity and Description of Pain Patient Has Paino Yes Site Locations Rate the pain. Current Pain Level: 6 Pain Management and Medication Current Pain Management: Electronic Signature(s) Signed: 08/30/2019 4:41:01 PM By: Harold Barban Entered By: Harold Barban on 08/30/2019 12:51:21 Bechler, Raymondo Viona Gilmore ([id number]) -------------------------------------------------------------------------------- Patient/Caregiver Education Details Patient Name: Diona Fanti. Date of Service: 08/30/2019 1:00 PM Medical Record Number: [id number] Patient Account Number: [id number] Date of Birth/Gender: 06/09/1956 (63 y.o. M) Treating RN: Cornell Barman Primary Care Physician: Domenica Fail Other Clinician: Referring Physician: Domenica Fail Treating Physician/Extender: Tito Dine in Treatment: 13 Education Assessment Education Provided To: Patient Education Topics Provided Wound/Skin Impairment: Handouts: Caring for Your Ulcer Methods: Demonstration, Explain/Verbal Responses: State content correctly Electronic Signature(s) Signed: 08/31/2019 10:04:30 AM By: Gretta Cool, BSN, RN, CWS, Kim RN, BSN Entered By: Gretta Cool, BSN, RN, CWS, Kim on 08/30/2019 13:38:50 Worth, Holley Dexter ([id number]) -------------------------------------------------------------------------------- Wound Assessment Details Patient Name: GEORGIA, BARIA. Date of Service: 08/30/2019 1:00 PM Medical Record Number:  [id number] Patient Account Number: [id number] Date of Birth/Sex: Mar 29, 1956 (63 y.o. M) Treating RN: Harold Barban Primary Care Glenis Musolf: Domenica Fail Other Clinician: Referring Olesya Wike: Domenica Fail Treating Nekita Pita/Extender: Tito Dine in Treatment: 13 Wound Status Wound Number: 2 Primary Pressure Ulcer Etiology: Wound Location: Right Gluteus Wound Open Wounding Event: Pressure Injury Status: Date Acquired: 05/03/2019 Comorbid Cataracts, Congestive Heart Failure, Coronary Weeks Of Treatment: 13 History: Artery Disease, Hypertension, Myocardial Clustered Wound: No Infarction, History of pressure wounds, Rheumatoid Arthritis Photos Wound Measurements Length: (cm) 4 % Reduction Width: (cm) 3.4 % Reduction Depth: (cm) 1 Epithelializ Area: (cm) 10.681 Tunneling: Volume: (cm) 10.681 Undermining Starting Ending Po Maximum D in Area: -2167.7% in Volume: -11262.8% ation: Small (1-33%) No : Yes Position (o'clock): 12 sition (o'clock): 7 istance: (cm) 1.6 Wound Description Classification: Category/Stage IV Foul Odor Af Wound Margin: Epibole Slough/Fibri Exudate Amount: Medium Exudate Type: Serous Exudate Color: amber ter Cleansing: No no No Wound Bed Granulation Amount: Small (1-33%) Exposed Structure Granulation Quality: Pink Fascia Exposed: No Necrotic Amount: Large (67-100%) Fat  Layer (Subcutaneous Tissue) Exposed: Yes Necrotic Quality: Adherent Slough Tendon Exposed: No Muscle Exposed: No [name], Demaree W. ([id number]) Joint Exposed: No Bone Exposed: Yes Electronic Signature(s) Signed: 08/30/2019 4:41:01 PM By: Arnette NorrisBiell, Kristina Signed: 08/31/2019 10:04:30 AM By: Elliot GurneyWoody, BSN, RN, CWS, Kim RN, BSN Entered By: Elliot GurneyWoody, BSN, RN, CWS, Kim on 08/30/2019 13:30:00 [name], Sherrill W. ([id number]) -------------------------------------------------------------------------------- Vitals Details Patient Name: [name], Carlee W. Date of Service: 08/30/2019 1:00  PM Medical Record Number: [id number] Patient Account Number: [id number] Date of Birth/Sex: [birth year] (63 y.o. M) Treating RN: Arnette NorrisBiell, Kristina Primary Care Nazaiah Navarrete: Sindy MessingGOMEZ, ROGER Other Clinician: Referring Tashae Inda: Sindy MessingGOMEZ, ROGER Treating Marili Vader/Extender: Altamese CarolinaOBSON, MICHAEL G Weeks in Treatment: 13 Vital Signs Time Taken: 12:50 Temperature (F): 97.5 Height (in): 73 Pulse (bpm): 114 Weight (lbs): 155 Respiratory Rate (breaths/min): 20 Body Mass Index (BMI): 20.4 Blood Pressure (mmHg): 101/87 Reference Range: 80 - 120 mg / dl Electronic Signature(s) Signed: 08/30/2019 4:41:01 PM By: Arnette NorrisBiell, Kristina Entered By: Arnette NorrisBiell, Kristina on 08/30/2019 12:53:02

## 2019-08-31 NOTE — Progress Notes (Signed)
Patient's Name: [name]  MRN: [id number]  Referring Provider: Tawny [name]  DOB: [birth year]  PCP: [name], [name]  DOS: 08/31/2019  Note by: Ryan Santa, MD  Service setting: Ambulatory outpatient  Attending: Gillis Santa, MD  Location: [facility] (AMB) Pain Management Facility  Specialty: Interventional Pain Management  Patient type: Established   Primary Reason(s) for Visit: Encounter for prescription drug management. (Level of risk: moderate)  CC: Pain (arthritis and wound on right buttock)  HPI  [name] is a 63 y.o. year old, male patient, who comes today for a medication management evaluation. He has Atherosclerotic heart disease of native coronary artery with other forms of angina pectoris ([place]); ILD (interstitial lung disease) ([place]); Anemia; AAA (abdominal aortic aneurysm) ([place]); Abnormal EKG; Acute on chronic respiratory failure with hypoxia ([place]); History of stroke; HTN (hypertension); Current smoker; Exertional shortness of breath; Solitary pulmonary nodule; S/P CABG (coronary artery bypass graft); Pure hypercholesterolemia; Acute combined systolic and diastolic heart failure ([place]); Chronic combined systolic and diastolic CHF (congestive heart failure) ([place]); Cyclic citrullinated peptide (CCP) antibody positive; Carotid artery disease ([place]); Acute pancreatitis; Elevated lipase; Elevated liver enzymes; Pneumonia; Cough; Leukocytosis; Cervicalgia; Chronic pain of both shoulders; Hx of rheumatoid arthritis; Chronic pain syndrome; Shock ([place]); High anion gap metabolic acidosis; Acute kidney injury ([place]); Pressure injury of skin of sacral region; Severe comorbid illness; Acute renal failure ([place]); Sacral ulcer, with unspecified severity ([place]); Cerebral thrombosis with cerebral infarction; AKI (acute kidney injury) ([place]); Urinary retention; Rheumatoid arthritis ([place]); Chronic obstructive pulmonary disease ([place]); Supplemental oxygen dependent; Tobacco abuse; AAA (abdominal  aortic aneurysm) without rupture ([place]); History of CVA with residual deficit; Acute blood loss anemia; Hyperkalemia; Adrenal insufficiency ([place]): Relative; Abscess of buttock; NSVT (nonsustained ventricular tachycardia) ([place]); Severe sepsis ([place]); Cholecystitis; Complicated UTI (urinary tract infection); Pressure injury of skin; and Acute osteomyelitis of pelvic region and thigh, right (HCC) on their problem list. His primarily concern today is the Pain (arthritis and wound on right buttock)  Pain Assessment: Location:   (arthritis and wound) Radiating:   Onset: More than a month ago Duration: Chronic pain Quality: Aching, Constant Severity: 9 /10 (subjective, self-reported pain score)  Effect on ADL:   Timing: Constant Modifying factors: rest, pressure off wound, medication BP: 107/76  HR: (!) 106  [name] was last scheduled for an appointment on Visit date not found for medication management. During today's appointment we reviewed [name] chronic pain status, as well as his outpatient medication regimen.  Patient's last visit with me was over 1 year ago on 02/23/2018.  He had transferred to a nursing home.  He has been dealing with a sacral wound ulcer.  This is been very painful for him.  He returned home couple of months ago.  He is here to continue his chronic pain regimen which included hydrocodone 10 mg 3 times daily as needed.  He was receiving this at the nursing home as well.  Patient appears very cachectic and ill-appearing.  He has lost significant weight since his last visit with me.  He has been fairly sedentary as a result of his sacral decubitus ulcer, stage IV.   The patient  reports no history of drug use. His body mass index is 20.71 kg/m.  Further details on both, my assessment(s), as well as the proposed treatment plan, please see below.  Controlled Substance Pharmacotherapy Assessment REMS (Risk Evaluation and Mitigation Strategy)  Analgesic: 04/30/2018  1    03/29/2018  Hydrocodone-Acetamin 10-325 MG  90.00  30 Bi Lat   [id number]   [place] ([id number])   0  30.00 MME  Medicaid   [name]     Hart Rochester, RN  08/31/2019  2:24 PM  Sign when Signing Visit Safety precautions to be maintained throughout the outpatient stay will include: orient to surroundings, keep bed in low position, maintain call bell within reach at all times, provide assistance with transfer out of bed and ambulation.    Pharmacokinetics: Liberation and absorption (onset of action): WNL Distribution (time to peak effect): WNL Metabolism and excretion (duration of action): WNL         Pharmacodynamics: Desired effects: Analgesia: [name] reports >50% benefit. Functional ability: Patient reports that medication allows him to accomplish basic ADLs Clinically meaningful improvement in function (CMIF): Sustained CMIF goals met Perceived effectiveness: Described as relatively effective, allowing for increase in activities of daily living (ADL) Undesirable effects: Side-effects or Adverse reactions: None reported Monitoring: [place] PMP: PDMP not reviewed this encounter. Online review of the past 80-monthperiod conducted. Compliant with practice rules and regulations Last UDS on record: Summary  Date Value Ref Range Status  05/25/2017 FINAL  Final    Comment:    ==================================================================== TOXASSURE COMP DRUG ANALYSIS,UR ==================================================================== Test                             Result       Flag       Units Drug Present and Declared for Prescription Verification   Acetaminophen                  PRESENT      EXPECTED   Salicylate                     PRESENT      EXPECTED Drug Present not Declared for Prescription Verification   Cyclobenzaprine                PRESENT      UNEXPECTED   Ibuprofen                      PRESENT      UNEXPECTED   Diphenhydramine                PRESENT      UNEXPECTED    Metoprolol                     PRESENT      UNEXPECTED Drug Absent but Declared for Prescription Verification   Pregabalin                     Not Detected UNEXPECTED ==================================================================== Test                      Result    Flag   Units      Ref Range   Creatinine              27               mg/dL      >=20 ==================================================================== Declared Medications:  The flagging and interpretation on this report are based on the  following declared medications.  Unexpected results may arise from  inaccuracies in the declared medications.  **Note: The testing scope of this panel includes these medications:  Pregabalin  **Note: The testing scope of  this panel does not include small to  moderate amounts of these reported medications:  Acetaminophen  Aspirin (Aspirin 81)  **Note: The testing scope of this panel does not include following  reported medications:  Albuterol  Albuterol (Proventil)  Albuterol (Ventolin HFA)  Atorvastatin (Lipitor)  Carvedilol (Coreg)  Lisinopril  Meloxicam  Spironolactone  Sulfasalazine  Topical ==================================================================== For clinical consultation, please call [phone]. ====================================================================    UDS interpretation: Compliant          Medication Assessment Form: Reviewed. Patient indicates being compliant with therapy Treatment compliance: Compliant Risk Assessment Profile: Aberrant behavior: See initial evaluations. None observed or detected today Comorbid factors increasing risk of overdose: See initial evaluation. No additional risks detected today Opioid risk tool (ORT):  Opioid Risk  08/31/2019  Alcohol 0  Illegal Drugs 0  Rx Drugs 0  Alcohol 0  Illegal Drugs 0  Rx Drugs 0  Age between 16-45 years  0  History of Preadolescent Sexual Abuse 0  Psychological Disease 0   Depression 0  Opioid Risk Tool Scoring 0  Opioid Risk Interpretation Low Risk    ORT Scoring interpretation table:  Score <3 = Low Risk for SUD  Score between 4-7 = Moderate Risk for SUD  Score >8 = High Risk for Opioid Abuse   Risk of substance use disorder (SUD): Low  Risk Mitigation Strategies:  Patient Counseling: Covered Patient-Prescriber Agreement (PPA): Present and active  Notification to other healthcare providers: Done  Pharmacologic Plan: No change in therapy, at this time.             Laboratory Chemistry Profile   Screening Lab Results  Component Value Date   SARSCOV2NAA NEGATIVE 08/07/2019   MRSAPCR NEGATIVE 08/10/2019   HIV NON REACTIVE 08/07/2019    Inflammation (CRP: Acute Phase) (ESR: Chronic Phase) Lab Results  Component Value Date   ESRSEDRATE 115 (H) 02/25/2017   LATICACIDVEN 2.0 (HH) 08/08/2019                         Rheumatology Lab Results  Component Value Date   RF >650.0 (H) 02/25/2017   ANA Negative 02/25/2017                        Renal Lab Results  Component Value Date   BUN 16 08/29/2019   CREATININE 0.79 08/29/2019   BCR 13 02/08/2018   GFRAA >60 08/29/2019   GFRNONAA >60 08/29/2019                             Hepatic Lab Results  Component Value Date   AST 12 (L) 08/12/2019   ALT 16 08/12/2019   ALBUMIN 2.1 (L) 08/12/2019   ALKPHOS 60 08/12/2019   LIPASE 32 06/04/2018                        Electrolytes Lab Results  Component Value Date   NA 136 08/29/2019   K 4.6 08/29/2019   CL 101 08/29/2019   CALCIUM 8.6 (L) 08/29/2019   MG 2.0 08/13/2019   PHOS 2.4 (L) 04/06/2018                        Neuropathy Lab Results  Component Value Date   VITAMINB12 726 02/25/2017   FOLATE 8.4 02/25/2017   HGBA1C 5.2 04/11/2018   HIV NON  REACTIVE 08/07/2019                        CNS No results found for: COLORCSF, APPEARCSF, RBCCOUNTCSF, WBCCSF, POLYSCSF, LYMPHSCSF, EOSCSF, PROTEINCSF, GLUCCSF, JCVIRUS, CSFOLI,  IGGCSF, LABACHR, ACETBL                      Bone No results found for: VD25OH, VD125OH2TOT, [id number], XA1287OM7, 25OHVITD1, 25OHVITD2, 25OHVITD3, TESTOFREE, TESTOSTERONE                       Coagulation Lab Results  Component Value Date   INR 1.00 06/04/2018   LABPROT 13.1 06/04/2018   PLT 279 08/29/2019   LABHEMA Note: 08/26/2017                        Cardiovascular Lab Results  Component Value Date   BNP 304.3 (H) 08/07/2019   TROPONINI 0.48 (HH) 04/07/2018   HGB 14.7 08/29/2019   HCT 48.1 08/29/2019                         ID Lab Results  Component Value Date   HIV NON REACTIVE 08/07/2019   [place] NEGATIVE 08/07/2019   MRSAPCR NEGATIVE 08/10/2019    Cancer No results found for: CEA, CA125, LABCA2                      Endocrine No results found for: TSH, FREET4, TESTOFREE, TESTOSTERONE, SHBG, ESTRADIOL, ESTRADIOLPCT, ESTRADIOLFRE, LABPREG, ACTH                      Note: Lab results reviewed.  Recent Diagnostic Imaging Results  CARDIAC CATHETERIZATION Narrative:  Prox RCA to Mid RCA lesion is 80% stenosed.  A stent was successfully placed.  Post intervention, there is a 0% residual stenosis.   ARUL FARABEE is a 63 y.o. male   [id number] LOCATION:  FACILITY: [facility]  PHYSICIAN: Quay Burow, M.D. [birth year]  DATE OF PROCEDURE:  08/10/2019  DATE OF DISCHARGE:  CARDIAC CATHETERIZATION / PCI DES RCA  History obtained from chart review.63 yo with history of RA-related ILD,  CAD s/p CABG, CVA, ischemic cardiomyopathy, prior osteomyelitis related to  sacral decubitus ulcer was admitted with acute hypoxemic respiratory  failure.  Patient has had a long history of ILD, followed by Dr.  [name] Caller.  He had not been on oxygen at home.  He has rheumatoid  arthritis, only on Plaquenil at time of admission.  S/p CABG in 1993, last  cath in 2018 showed occluded SVG-RCA, seq SVG-OM and ramus, occluded  native left main/LAD/LCx/ramus with patent LIMA-LAD  (70% distal LAD) and  patent RCA with 70% proximal stenosis. Echo in 6/17 showed EF 30-35%, echo  in 10/20 showed EF 25-30%.  He has not been seen by a cardiologist in a  couple of years.    A few days prior to admission, patient developed chest tightness centrally  that waxed and waned but did not fully resolve.  He also developed dyspnea  to the point where he was out of breath with any exertion.  He came to the  ER and was noted to be hypoxemic, required 6L [place] (has now been titrated  down to 2L [place]).  He was treated empirically with ceftriaxone/azithromycin  and was started on a steroid taper for possible ILD flare.  CTA chest  showed no PE.  High resolution CT chest showed progression of ILD compared  to 2018, also with superimposed pulmonary edema versus acute inflammation.   He has been afebrile.  Systolic BP has been in the 80s-90s at times, up  to 100s today.  Lactate was mildly elevated at 2, procalcitonin was not  elevated.  Troponin 29 => 36. Antibiotics have been stopped as PNA is  unlikely.  He underwent right left heart cath by Dr. Aundra Dubin via the right  radial approach this morning revealing a patent LIMA to the LAD.  The left  system was known to be occluded as was the right graft.  He had an 80% mid  dominant RCA stenosis.  His LVEDP was 33 and his wedge was elevated as  well.  He does have pulmonary fibrosis.  Dr. Aundra Dubin felt that he would  benefit from RCA intervention.  PROCEDURE DESCRIPTION:   The patient was brought to the second floor [facility] cath lab in  the postabsorptive state. He was premedicated with IV Versed and fentanyl.  His left wristwas prepped and shaved in usual sterile fashion. Xylocaine  1% was used for local anesthesia. A 6 French sheath was inserted into the  left radial  artery using standard Seldinger technique.  The patient received a total  of 11,000 as of heparin with an ACT of only 211.  Because of this he was  switched Angiomax  bolus followed by infusion with an ACT of 424.  The patient did receive 600 mg of p.o. Plavix.  Using a 6 [place] JR4 with  sidehole guide catheter (damped without sideholes) along with a 0.14  pro-water guidewire and a 2 mm x 12 m balloon the mid RCA lesion was  easily crossed and predilated.  A 2.25 x 16 mm long Synergy drug-eluting  stent was then carefully positioned and deployed at 14 atm.  There was an  area of hypodensity at the proximal edge of the stent which appeared  hemodynamically significant.  I did give 200 mcg of intra-chordae  nitroglycerin without change in appearance suggesting this was not spasm.   I then placed a 2.25 mm x 12 mm long Synergy drug-eluting stent across  that segment overlapping with the.  Previous stent deployed at 14 atm.   Postdilated the entire stented segment with a 2.5 x 12 mm long  noncompliant balloon at 14 atm (2.6 mm) resulting reduction of an 80% mid  dominant RCA stenosis to 0% residual.  The guidewire and catheter were  then removed.  The patient had rapid respiratory decompensation with hypoxia and  tachycardia along with hypotension.  He was given 80 mg of Lasix IV.  His  sats began to drop and he was placed on escalated doses of nasal prong  oxygen ultimately transitioned to BiPAP.  Also given albuterol inhalation  treatment.  He had few brief episodes of wide-complex tachycardia that  appeared to be SVT which ultimately improved.  He did receive an  amiodarone bolus.  Ultimately, with better oxygenation his sats improved  into the high 90s.  We sat him upright at 45 degrees.  He began to urinate  and his heart rate slowly improved as did his blood pressure. Impression: Successful mid RCA PCI drug-eluting stenting in the setting of  high-grade mid RCA with ischemic cardiomyopathy, and idiopathic pulmonary  fibrosis with COPD.  The patient did receive Angiomax which was allowed to  finish without restarting.  The radial sheath was removed  and a TR band  was placed on the left wrist to achieve patent hemostasis.  The patient  was more comfortable after the previously mentioned interventions.  Given  his pulmonary and hemodynamic instability I am going to put him on unit 2H  for further observation.  I have discussed the case with Dr. Aundra Dubin who  was present during the post intervention time.Quay Burow. MD, [facility] 08/10/2019 10:19 AM CARDIAC CATHETERIZATION 1. Normal RA pressure, PCWP still elevated.  2. Mild pulmonary venous hypertension.  3. Preserved cardiac output.  4. Patent LIMA-LAD with known 70% far distal LAD stenosis.  5. 80-90% mid RCA stenosis.  6. The 2 SVGs and left main known to be occluded.   Patient had several days of chest pain prior to admission though troponin  not markedly elevated.  Concerned for unstable angina, will plan PCI to  RCA today.   Complexity Note: Imaging results reviewed. Results shared with Mr. Poole, using Layman's terms.                               Meds   Current Outpatient Medications:  .  acetaminophen (TYLENOL) 500 MG tablet, Take 1,000 mg by mouth every 6 (six) hours as needed for mild pain. , Disp: , Rfl:  .  albuterol (PROVENTIL HFA) 108 (90 Base) MCG/ACT inhaler, Inhale 2 puffs into the lungs every 6 (six) hours as needed for wheezing or shortness of breath., Disp: 8 g, Rfl: 0 .  albuterol (PROVENTIL) (2.5 MG/3ML) 0.083% nebulizer solution, Take 3 mLs (2.5 mg total) by nebulization every 6 (six) hours as needed for wheezing or shortness of breath., Disp: 75 mL, Rfl: 0 .  aspirin EC 81 MG tablet, Take 81 mg by mouth daily. , Disp: , Rfl:  .  atorvastatin (LIPITOR) 40 MG tablet, Take 1 tablet (40 mg total) by mouth daily at 6 PM. (Patient taking differently: Take 80 mg by mouth daily at 6 PM. ), Disp: 30 tablet, Rfl: 11 .  buPROPion (WELLBUTRIN SR) 100 MG 12 hr tablet, Take 100 mg by mouth 2 (two) times daily., Disp: , Rfl:  .  carvedilol (COREG) 3.125 MG tablet, Take  1 tablet (3.125 mg total) by mouth 2 (two) times daily with a meal., Disp: 60 tablet, Rfl: 0 .  cholecalciferol (VITAMIN D3) 25 MCG (1000 UT) tablet, Take 1,000 Units by mouth daily., Disp: , Rfl:  .  clopidogrel (PLAVIX) 75 MG tablet, Take 1 tablet (75 mg total) by mouth daily with breakfast., Disp: 30 tablet, Rfl: 0 .  furosemide (LASIX) 40 MG tablet, Take 1 tablet (40 mg total) by mouth daily., Disp: 30 tablet, Rfl: 0 .  gabapentin (NEURONTIN) 300 MG capsule, Take 1 capsule (300 mg total) by mouth every 8 (eight) hours., Disp: 90 capsule, Rfl: 4 .  guaiFENesin (MUCINEX) 600 MG 12 hr tablet, Take 600 mg by mouth 2 (two) times daily as needed for cough or to loosen phlegm., Disp: , Rfl:  .  hydroxychloroquine (PLAQUENIL) 200 MG tablet, Take 1 tablet (200 mg total) by mouth 2 (two) times daily., Disp: , Rfl: 0 .  magnesium hydroxide (MILK OF MAGNESIA) 400 MG/5ML suspension, Take 30 mLs by mouth daily as needed for mild constipation. And at bedtime, Disp: , Rfl:  .  Menthol-Methyl Salicylate (MUSCLE RUB EX), Apply 1 application topically as needed (for shoulder or knee pain). , Disp: , Rfl:  .  mirtazapine (REMERON)  7.5 MG tablet, Take 7.5 mg by mouth at bedtime., Disp: , Rfl:  .  nutrition supplement, JUVEN, (JUVEN) PACK, Take 1 packet by mouth 2 (two) times daily between meals., Disp: , Rfl: 0 .  nystatin cream (MYCOSTATIN), Apply topically 2 (two) times daily. (Patient taking differently: Apply 1 application topically 2 (two) times daily. ), Disp: 30 g, Rfl: 0 .  polyethylene glycol (MIRALAX / GLYCOLAX) packet, Take 17 g by mouth 2 (two) times daily. Until having 1 soft BM daily, then can decrease to daily or as needed (Patient taking differently: Take 17 g by mouth daily. Until having 1 soft BM daily, then can decrease to daily or as needed), Disp: 14 each, Rfl: 0 .  potassium chloride SA (K-DUR,KLOR-CON) 20 MEQ tablet, Take 2 tablets (40 mEq total) by mouth daily. (Patient taking differently: Take  20 mEq by mouth 2 (two) times daily. ), Disp: , Rfl:  .  saccharomyces boulardii (FLORASTOR) 250 MG capsule, Take 1 capsule (250 mg total) by mouth 2 (two) times daily., Disp: , Rfl:  .  spironolactone (ALDACTONE) 25 MG tablet, Take 0.5 tablets (12.5 mg total) by mouth at bedtime., Disp: 30 tablet, Rfl: 0 .  tamsulosin (FLOMAX) 0.4 MG CAPS capsule, Take 1 capsule (0.4 mg total) by mouth daily after supper., Disp: 30 capsule, Rfl: 4 .  HYDROcodone-acetaminophen (NORCO) 10-325 MG tablet, Take 1 tablet by mouth every 8 (eight) hours as needed for severe pain. Must last 30 days., Disp: 90 tablet, Rfl: 0 .  [START ON 09/30/2019] HYDROcodone-acetaminophen (NORCO) 10-325 MG tablet, Take 1 tablet by mouth every 8 (eight) hours as needed for severe pain. Must last 30 days., Disp: 90 tablet, Rfl: 0 .  [START ON 10/30/2019] HYDROcodone-acetaminophen (NORCO) 10-325 MG tablet, Take 1 tablet by mouth every 8 (eight) hours as needed for severe pain. Must last 30 days., Disp: 90 tablet, Rfl: 0  ROS  Constitutional: Denies any fever or chills Gastrointestinal: No reported hemesis, hematochezia, vomiting, or acute GI distress Musculoskeletal: Denies any acute onset joint swelling, redness, loss of ROM, or weakness Neurological: No reported episodes of acute onset apraxia, aphasia, dysarthria, agnosia, amnesia, paralysis, loss of coordination, or loss of consciousness  Allergies  Mr. Conigliaro has No Known Allergies.  [place]  Drug: Mr. Cone  reports no history of drug use. Alcohol:  reports no history of alcohol use. Tobacco:  reports that he quit smoking about 21 months ago. His smoking use included cigarettes. He has a 47.00 pack-year smoking history. He has never used smokeless tobacco. Medical:  has a past medical history of AAA (abdominal aortic aneurysm) ([place]), AKI (acute kidney injury) ([place]), Arthritis, CAD in native artery, Carotid artery disease ([place]), Chronic combined systolic and diastolic CHF (congestive  heart failure) ([place]), COPD (chronic obstructive pulmonary disease) ([place]), GERD (gastroesophageal reflux disease), Hyperkalemia, ILD (interstitial lung disease) ([place]), Lung nodule, MI (myocardial infarction) ([place]), Normocytic anemia, NSVT (nonsustained ventricular tachycardia) ([place]), Pancreatitis, Pneumonia (11/2017), RA (rheumatoid arthritis) ([place]), Stroke ([place]), and Tobacco abuse. Surgical: Mr. Fanning  has a past surgical history that includes Cardiac catheterization; LEFT HEART CATH AND CORS/GRAFTS ANGIOGRAPHY (N/A, 03/01/2017); Carotid endarterectomy (Left); Heart Bypass (10/1992); Throat Biopsy; Incision and drainage perirectal abscess (Right, 05/09/2018); RIGHT/LEFT HEART CATH AND CORONARY/GRAFT ANGIOGRAPHY (N/A, 08/10/2019); and CORONARY STENT INTERVENTION (N/A, 08/10/2019). Family: family history includes Cancer in his maternal aunt and maternal grandmother; Heart attack in his brother, brother, father, maternal grandmother, and mother; Heart disease in his mother; Heart murmur in his mother; Lung cancer  in his maternal aunt and maternal uncle; Stroke in his brother; Suicidality in his father.  Constitutional Exam  General appearance: alert, cooperative, slowed mentation, in mild distress and cachectic Vitals:   08/31/19 1416  BP: 107/76  Pulse: (!) 106  Resp: 18  Temp: 98.4 F (36.9 C)  TempSrc: Oral  SpO2: 92%  Weight: 157 lb (71.2 kg)  Height: _0  (1.854 m)   BMI Assessment: Estimated body mass index is 20.71 kg/m as calculated from the following:   Height as of this encounter: _1  (1.854 m).   Weight as of this encounter: 157 lb (71.2 kg).  BMI interpretation table: BMI level Category Range association with higher incidence of chronic pain  <18 kg/m2 Underweight   18.5-24.9 kg/m2 Ideal body weight   25-29.9 kg/m2 Overweight Increased incidence by 20%  30-34.9 kg/m2 Obese (Class I) Increased incidence by 68%  35-39.9 kg/m2 Severe obesity (Class II) Increased incidence by 136%   >40 kg/m2 Extreme obesity (Class III) Increased incidence by 254%   Patient's current BMI Ideal Body weight  Body mass index is 20.71 kg/m. Ideal body weight: 79.9 kg (176 lb 2.4 oz)   BMI Readings from Last 4 Encounters:  08/31/19 20.71 kg/m  08/29/19 18.81 kg/m  08/21/19 20.71 kg/m  08/11/19 21.41 kg/m   Wt Readings from Last 4 Encounters:  08/31/19 157 lb (71.2 kg)  08/29/19 142 lb 9.6 oz (64.7 kg)  08/21/19 157 lb (71.2 kg)  08/11/19 162 lb 4.1 oz (73.6 kg)  Psych/Mental status: Alert, oriented x 3 (person, place, & time)       Eyes: PERLA Respiratory: No evidence of acute respiratory distress  Cervical Spine Area Exam  Skin & Axial Inspection: No masses, redness, edema, swelling, or associated skin lesions Alignment: Symmetrical Functional ROM: Decreased ROM      Stability: No instability detected Muscle Tone/Strength: Functionally intact. No obvious neuro-muscular anomalies detected. Sensory (Neurological): Musculoskeletal pain pattern Palpation: No palpable anomalies              Upper Extremity (UE) Exam    Side: Right upper extremity  Side: Left upper extremity  Skin & Extremity Inspection: Skin color, temperature, and hair growth are WNL. No peripheral edema or cyanosis. No masses, redness, swelling, asymmetry, or associated skin lesions. No contractures.  Skin & Extremity Inspection: Skin color, temperature, and hair growth are WNL. No peripheral edema or cyanosis. No masses, redness, swelling, asymmetry, or associated skin lesions. No contractures.  Functional ROM: Unrestricted ROM          Functional ROM: Unrestricted ROM          Muscle Tone/Strength: Functionally intact. No obvious neuro-muscular anomalies detected.  Muscle Tone/Strength: Functionally intact. No obvious neuro-muscular anomalies detected.  Sensory (Neurological): Arthropathic arthralgia          Sensory (Neurological): Arthropathic arthralgia          Palpation: No palpable anomalies               Palpation: No palpable anomalies              Provocative Test(s):  Phalen's test: deferred Tinel's test: deferred Apley's scratch test (touch opposite shoulder):  Action 1 (Across chest): deferred Action 2 (Overhead): deferred Action 3 (LB reach): deferred   Provocative Test(s):  Phalen's test: deferred Tinel's test: deferred Apley's scratch test (touch opposite shoulder):  Action 1 (Across chest): deferred Action 2 (Overhead): deferred Action 3 (LB reach): deferred    Thoracic Spine Area  Exam  Skin & Axial Inspection: No masses, redness, or swelling Alignment: Symmetrical Functional ROM: Unrestricted ROM Stability: No instability detected Muscle Tone/Strength: Functionally intact. No obvious neuro-muscular anomalies detected. Sensory (Neurological): Unimpaired Muscle strength & Tone: No palpable anomalies  Lumbar Spine Area Exam  Skin & Axial Inspection: No masses, redness, or swelling Alignment: Symmetrical Functional ROM: Decreased ROM       Stability: No instability detected Muscle Tone/Strength: Functionally intact. No obvious neuro-muscular anomalies detected. Sensory (Neurological): Musculoskeletal pain pattern  Gait & Posture Assessment  Ambulation: Patient came in today in a wheel chair Gait: Significantly limited. Dependent on assistive device to ambulate Posture: Difficulty with positional changes   Lower Extremity Exam    Side: Right lower extremity  Side: Left lower extremity  Stability: No instability observed          Stability: No instability observed          Skin & Extremity Inspection: Skin color, temperature, and hair growth are WNL. No peripheral edema or cyanosis. No masses, redness, swelling, asymmetry, or associated skin lesions. No contractures.  Skin & Extremity Inspection: Skin color, temperature, and hair growth are WNL. No peripheral edema or cyanosis. No masses, redness, swelling, asymmetry, or associated skin lesions. No contractures.   Functional ROM: Pain restricted ROM for all joints of the lower extremity          Functional ROM: Pain restricted ROM for all joints of the lower extremity          Muscle Tone/Strength: Functionally intact. No obvious neuro-muscular anomalies detected.  Muscle Tone/Strength: Functionally intact. No obvious neuro-muscular anomalies detected.  Sensory (Neurological): Unimpaired        Sensory (Neurological): Unimpaired        DTR: Patellar: deferred today Achilles: deferred today Plantar: deferred today  DTR: Patellar: deferred today Achilles: deferred today Plantar: deferred today  Palpation: No palpable anomalies  Palpation: No palpable anomalies   Assessment   Status Diagnosis  Persistent Persistent Persistent 1. Hx of rheumatoid arthritis   2. Neuropathy   3. Cervicalgia   4. Osteoarthritis of right shoulder, unspecified osteoarthritis type   5. Chronic pain syndrome      Plan of Care  Pharmacotherapy (Medications Ordered): Meds ordered this encounter  Medications  . HYDROcodone-acetaminophen (NORCO) 10-325 MG tablet    Sig: Take 1 tablet by mouth every 8 (eight) hours as needed for severe pain. Must last 30 days.    Dispense:  90 tablet    Refill:  0    Chronic Pain. (STOP Act - Not applicable). Fill one day early if closed on scheduled refill date.  [name] HYDROcodone-acetaminophen (NORCO) 10-325 MG tablet    Sig: Take 1 tablet by mouth every 8 (eight) hours as needed for severe pain. Must last 30 days.    Dispense:  90 tablet    Refill:  0    Chronic Pain. (STOP Act - Not applicable). Fill one day early if closed on scheduled refill date.  [name] HYDROcodone-acetaminophen (NORCO) 10-325 MG tablet    Sig: Take 1 tablet by mouth every 8 (eight) hours as needed for severe pain. Must last 30 days.    Dispense:  90 tablet    Refill:  0    Chronic Pain. (STOP Act - Not applicable). Fill one day early if closed on scheduled refill date.   Continue multimodal analgesics with  acetaminophen 1000 mg twice daily to 3 times daily as needed, gabapentin 300 mg 3 times daily.  Continue  Plaquenil for RA.  Orders:  Orders Placed This Encounter  Procedures  . ToxASSURE Select 13 (MW), Urine    Volume: 30 ml(s). Minimum 3 ml of urine is needed. Document temperature of fresh sample. Indications: Long term (current) use of opiate analgesic (Z79.891)    Lab Orders     ToxASSURE Select 13 (MW), Urine Planned follow-up:   Return in about 3 months (around 12/01/2019) for Medication Management.   Recent Visits No visits were found meeting these conditions.  Showing recent visits within past 90 days and meeting all other requirements   Today's Visits Date Type Provider Dept  08/31/19 Office Visit Ryan Santa, MD [facility]  Showing today's visits and meeting all other requirements   Future Appointments No visits were found meeting these conditions.  Showing future appointments within next 90 days and meeting all other requirements   Primary Care Physician: Ryan Demark, PA-C Location: [facility] Note by: Ryan Santa, MD Date: 08/31/2019; Time: 3:11 PM  Note: This dictation was prepared with Dragon dictation. Any transcriptional errors that may result from this process are unintentional.

## 2019-08-31 NOTE — Progress Notes (Signed)
Safety precautions to be maintained throughout the outpatient stay will include: orient to surroundings, keep bed in low position, maintain call bell within reach at all times, provide assistance with transfer out of bed and ambulation.  

## 2019-08-31 NOTE — Patient Instructions (Signed)
Hydrocodone with acetaminophen to last until 11/29/2019 has been excribed to your pharmacy.  You will have urine drug screen completed prior to next appt. [facility].

## 2019-09-04 LAB — TOXASSURE SELECT 13 (MW), URINE

## 2019-09-05 ENCOUNTER — Telehealth: Payer: Self-pay | Admitting: Internal Medicine

## 2019-09-05 DIAGNOSIS — M359 Systemic involvement of connective tissue, unspecified: Secondary | ICD-10-CM

## 2019-09-05 DIAGNOSIS — J8489 Other specified interstitial pulmonary diseases: Secondary | ICD-10-CM

## 2019-09-05 NOTE — Telephone Encounter (Signed)
Spoke with pt's sister is requesting something for his nose that can help with dryness. She thinks there is a mist on the machine that helps keep his nose from drying. MR do you have any suggestions? Please advise.

## 2019-09-06 ENCOUNTER — Other Ambulatory Visit: Payer: Self-pay

## 2019-09-06 ENCOUNTER — Other Ambulatory Visit
Admission: RE | Admit: 2019-09-06 | Discharge: 2019-09-06 | Disposition: A | Discharge status: Home / Self Care | Payer: Medicaid Other | Source: Ambulatory Visit | Attending: Internal Medicine | Admitting: Internal Medicine

## 2019-09-06 ENCOUNTER — Ambulatory Visit (INDEPENDENT_AMBULATORY_CARE_PROVIDER_SITE_OTHER): Payer: Medicaid Other | Admitting: Primary Care

## 2019-09-06 ENCOUNTER — Encounter: Payer: Medicaid Other | Attending: Internal Medicine | Admitting: Internal Medicine

## 2019-09-06 DIAGNOSIS — M8580 Other specified disorders of bone density and structure, unspecified site: Secondary | ICD-10-CM | POA: Diagnosis not present

## 2019-09-06 DIAGNOSIS — I2510 Atherosclerotic heart disease of native coronary artery without angina pectoris: Secondary | ICD-10-CM | POA: Insufficient documentation

## 2019-09-06 DIAGNOSIS — B999 Unspecified infectious disease: Secondary | ICD-10-CM | POA: Insufficient documentation

## 2019-09-06 DIAGNOSIS — Z7952 Long term (current) use of systemic steroids: Secondary | ICD-10-CM | POA: Diagnosis not present

## 2019-09-06 DIAGNOSIS — M069 Rheumatoid arthritis, unspecified: Secondary | ICD-10-CM | POA: Diagnosis not present

## 2019-09-06 DIAGNOSIS — Z955 Presence of coronary angioplasty implant and graft: Secondary | ICD-10-CM | POA: Diagnosis not present

## 2019-09-06 DIAGNOSIS — J849 Interstitial pulmonary disease, unspecified: Secondary | ICD-10-CM | POA: Insufficient documentation

## 2019-09-06 DIAGNOSIS — E43 Unspecified severe protein-calorie malnutrition: Secondary | ICD-10-CM | POA: Insufficient documentation

## 2019-09-06 DIAGNOSIS — I252 Old myocardial infarction: Secondary | ICD-10-CM | POA: Insufficient documentation

## 2019-09-06 DIAGNOSIS — I5042 Chronic combined systolic (congestive) and diastolic (congestive) heart failure: Secondary | ICD-10-CM | POA: Insufficient documentation

## 2019-09-06 DIAGNOSIS — I255 Ischemic cardiomyopathy: Secondary | ICD-10-CM | POA: Diagnosis not present

## 2019-09-06 DIAGNOSIS — L89314 Pressure ulcer of right buttock, stage 4: Secondary | ICD-10-CM | POA: Diagnosis not present

## 2019-09-06 DIAGNOSIS — Z8673 Personal history of transient ischemic attack (TIA), and cerebral infarction without residual deficits: Secondary | ICD-10-CM | POA: Diagnosis not present

## 2019-09-06 DIAGNOSIS — M8668 Other chronic osteomyelitis, other site: Secondary | ICD-10-CM | POA: Diagnosis not present

## 2019-09-06 DIAGNOSIS — I110 Hypertensive heart disease with heart failure: Secondary | ICD-10-CM | POA: Insufficient documentation

## 2019-09-06 DIAGNOSIS — L0231 Cutaneous abscess of buttock: Secondary | ICD-10-CM | POA: Insufficient documentation

## 2019-09-06 NOTE — Telephone Encounter (Signed)
Attempted to call pt's sister Tye [place] to let her know info stated by MR in regards to recs to help with the nasal dryness but unable to reach her.  Message was left for [facility] to return call. I have pended Rx for DME in regards to having humidifier sent in so it could be attached to O2. Will send order to DME once Tye [place] returns call.

## 2019-09-06 NOTE — Telephone Encounter (Signed)
Please see if o2 company can attach a humidifier. In interim he can use saline nasal spray as needed

## 2019-09-07 ENCOUNTER — Telehealth: Payer: Self-pay | Admitting: Internal Medicine

## 2019-09-07 NOTE — Telephone Encounter (Signed)
Pt sister returned call and would like a call back

## 2019-09-07 NOTE — Telephone Encounter (Signed)
Spoke with pt's sister, Tye [place]. She is aware of MR's recommendations. Order has been signed for DME. Nothing further was needed.

## 2019-09-07 NOTE — Telephone Encounter (Signed)
Routing to MR as an Juluis Rainier so he is aware that this was missed.

## 2019-09-08 NOTE — Progress Notes (Signed)
[name], [name] ([id number]) Visit Report for 09/06/2019 Debridement Details Patient Name: [name], [name] Date of Service: 09/06/2019 3:00 PM Medical Record Number: [id number] Patient Account Number: [id number] Date of Birth/Sex: May 03, 1956 (62 y.o. M) Treating RN: Huel Coventry Primary Care Provider: Sindy Messing Other Clinician: Referring Provider: Sindy Messing Treating Provider/Extender: Altamese [place] in Treatment: 14 Debridement Performed for Wound #2 Right Gluteus Assessment: Performed By: Physician Maxwell Caul, MD Debridement Type: Debridement Level of Consciousness (Pre- Awake and Alert procedure): Pre-procedure Verification/Time Yes - 15:35 Out Taken: Start Time: 15:35 Pain Control: Lidocaine Total Area Debrided (L x W): 0.5 (cm) x 0.8 (cm) = 0.4 (cm) Tissue and other material Viable, Bone debrided: Level: Skin/Subcutaneous Tissue/Muscle/Bone Debridement Description: Excisional Instrument: Rongeur Specimen: Tissue Culture Number of Specimens Taken: 2 Bleeding: Moderate Hemostasis Achieved: Pressure End Time: 15:39 Response to Treatment: Procedure was tolerated well Level of Consciousness Awake and Alert (Post-procedure): Post Debridement Measurements of Total Wound Length: (cm) 4.5 Stage: Category/Stage IV Width: (cm) 3.4 Depth: (cm) 1.2 Volume: (cm) 14.42 Character of Wound/Ulcer Post Requires Further Debridement Debridement: Post Procedure Diagnosis Same as Pre-procedure Electronic Signature(s) Signed: 09/07/2019 5:19:38 PM By: Elliot Gurney, BSN, RN, CWS, Kim RN, BSN Signed: 09/08/2019 7:47:21 AM By: Baltazar Najjar MD Previous Signature: 09/06/2019 4:31:52 PM Version By: Elliot Gurney, BSN, RN, CWS, Kim RN, BSN Entered By: Baltazar Najjar on 09/07/2019 07:59:29 [name], [name] ([id number]) [name], [name] ([id number]) -------------------------------------------------------------------------------- HPI Details Patient Name: [name], [name] Date of Service: 09/06/2019 3:00 PM Medical Record Number: [id number] Patient Account Number: [id number] Date of Birth/Sex: October 12, 1956 (62 y.o. M) Treating RN: Huel Coventry Primary Care Provider: Sindy Messing Other Clinician: Referring Provider: Sindy Messing Treating Provider/Extender: Altamese [place] in Treatment: 14 History of Present Illness HPI Description: ADMISSION 06/01/18 This is a 63 year old man who is sent to [place] currently residing at [facility] skilled facility. He has a complicated recent medical history. His sister accompanies him tells [place] that he has been somewhat disabled since suffering a left basal ganglial CVA in 2018. At that point in time he also had myocardial infarctions. More recently he has been hospitalized from 04/04/18 through 04/12/18 with septic shock, coronary artery disease, congestive heart failure and an acute CVA. During this hospitalization he was noted to have sacral wounds but these were not felt to be the source of infection however the source of the infection was never really determined at that point. I believe he was sent to a skilled facility. He reports that at the hospital on 05/09/18 through 05/18/18. At that point he was noted to have a large abscess of the right buttock. He required a surgical IandD by Dr. Gerrit Friends of general surgery. Culture of this grew Citrobacter,Eikenella and Bacteroides. He had a CT scan of the pelvis that showed an abnormal soft tissue collection just caudal to the right ischial tuberosity measuring 5.9 and by 4.7. This also involved the distal right deep muscles. There was some cortical bone loss on the right ischial tuberosity and there was some concern with osteomyelitis. He was felt to require treatment for osteomyelitis based on clinical and radiographic findings. He did not have an MRI. Past medical history includes; abdominal aortic aneurysm, carotid stenosis, hypertension, osteoarthritis, interstitial lung disease  on chronic steroids, rheumatoid arthritis, congestive heart failure/ischemic cardiomyopathy with an ejection fraction of 35-40% Also noteworthy is on 05/09/18 his albumin was 2.2 on admission to hospital. I don't think this is actually been repeated. The patient states his appetite is  good and he is taking supplements 100%. 06/15/18-He is seen in follow up vibration for right buttock ulcer; this is stable in appearance with minimal amount of non-viable tissue. 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 We've been using a wound VAC. This is centered over the right ischial tuberosity. He is making generally good progress. He was severely hypoalbuminemic at one time although he states that he eats well and takes his supplements. 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 We have been using silver collagen over the superior aspect of the right ischial tuberosity under wound VAC.he has made some nice improvements. The dimensions of the wound are certainly smaller. He has no exposed bone. We received lab work from [facility]. His prealbumin was 18.6 and albumin at 2.57. This is obviously still low. I am not sure what interventions are in place. I know he is on supplements. I discussed this with the patient. I told him that the albumin represents already moderate to severe protein malnutrition. This would contribute it to do difficulty healing the wound 07/20/18; I received copies of lab work from [facility] on 07/06/18 documenting a sedimentation rate of 25 which seems to be in line with what I see from previous values however his C-reactive protein was 110.8 versus 84.7 on 06/22/18. I have looked through [facility] health Link I don't see a prior value for either inflammatory measure. he has been treated with [name] which I think  he is still on for osteomyelitis of the right ischial tuberosity as well as a soft tissue collection just caudad to the right ischial tuberosity itself. Felt to have a soft tissue abscess and/or stigmata of necrotizing fasciitis. I'm not sure who is following this. His CT scan was on 05/09/18 yet he still remains on Invanz. Looking back through the original discharge summary the [name] was supposed to finish on 07/05/18. 08/03/18 the patient continues to make good improvement. The area over the right ischial tuberosity has filled in nicely. His developed some what looks to be candidal skin infection in the periwound. I think they've taken the wound VAC off and are applying nystatin 08/17/18; 2 week follow-up for this resident from [facility]. They resumed the wound VAC. The area continues to fill in nicely. The only problem area is the tissue over the ischial tuberosity itself which needs to come up a bit. For this reason I'm continuing the wound VAC. Otherwise this is doing remarkably well. The patient states that he is eating everything they give XZAVIAR, [name] ([id number]) him although I don't see a follow-up albumin. 08/31/18; the patient states he had blood work done today. I do not have these results. He claims to be eating well. He continues with a wound VAC and the wound is making nice progress. 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 This is coming quite nicely he still has one Onalee Hua that has some depth but I think by the next time we see him in 2 weeks we should be able to move on from the wound VAC. He is still at skilled care at [facility] place. As far as I am aware he is  eating and drinking well. I have not seen follow-up albumin studies on him recently but the message I'm getting is that he is eating  well and taking his supplements. The patient states he is not very mobile and I am hopeful that we are able to insure pressure relief here however if we hadn't been ensuring pressure relief I doubt this would've closed in as well as it has even with the wound VAC. He does not complain of any systemic issues 09/28/2018 for follow-up and management of stage IV sacral ulcer that is being managed with a wound VAC. He is a resident at a long-term care facility, [facility]. The wound continues to have some depth and width some granulation of tissue on the outer borders of the wound. The wound was tender to touch today during exam. He denies any pain to the wound when it is not being touched. Recent fevers, chills, nausea, vomiting, or shortness of breath. Hopeful over the next couple of weeks he will be able to transition from the wound VAC. 10/19/2018; this patient has a stage IV over the right ischial tuberosity. The wound is come to the surface of the skin and looks very healthy at this point. His wound VAC can be discontinued. The patient apparently is eating well taking his supplements. He has no systemic complaints 11/23/2018. The patient has a stage IV wound over his right ischial tuberosity that is now down to a quarter sized superficial area. There is no depth to this. The tissue looks healthy. No debridement was required. Continued improvement. We are using silver alginate and bordered foam 2/19; the original stage IV wound over his right ischial tuberosity. Continues to make nice progress in terms of dimensions. There is no depth to this. We have been using silver alginate and border foam 3/11; this is in follow-up of his original stage IV wound over his right ischial tuberosity. This continues to make nice progress using silver alginate and border foam vigorous offloading. He is at [facility] skilled facility. Patient complains of terrible pain in his hands and having trouble getting pain  medication which he has to ask for as needed. He apparently has a history of rheumatoid arthritis and I note he is on prednisone and chloroquine. His rheumatologist is at [facility] clinic. 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 This continues to make decent progress is smaller. There is some undermining at roughly 12-3 o'clock. We have been using silver alginate 4/29; patient's wound continues to get smaller. The remaining area is epithelializing. The undermining area still is not epithelialized however. We have been using silver collagen since the last time he is here. He does not come any notes from the facility where he lives [facility]] but he claims to be eating well and vigorously offloading this area 5/20-Patient returns to clinic for right ischial wound. No notes available from facility, wound appears to be improving, we have been using Prisma and he has been offloading this area while using his wheelchair in fact he has started to use the walker again READMISSION 05/31/2019 This is a patient we cared for her for a prolonged period of time with a stage IV pressure area over his right ischio tuberosity. 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 We were able to get this to markedly improve with a wound VAC over time. We last saw  this patient in clinic on 5/20 we are using silver collagen and a small wound that was just about closed. Per the patient this wound actually did progress to closure although I do not have any independent verification of this from [facility] where he lives. He states that this reopened about 2 to 3 months ago. His timeframe is off however again I do not have any independent verification. He has a small wound with a lip lip of tissue over the top of this 8/26; wound is down to a very tiny open area about slight hyper granulation. The area is healing  with epithelialization and a bit of a divot. I used silver nitrate. Continue with Hydrofera Blue 9/23; patient arrives today with the wound quite a bit worse. There is a lip of skin and subcutaneous tissue over the majority of the visible part of this wound however this is fairly deep underneath this with exposed bone. At one point he had a large stage IV wound and this with underlying osteomyelitis. We have been using Hydrofera Blue. Also, the patient is being discharged in 8 days to home to live with [place] sister in [place]. There will be a lot of arrangements here. I counseled the patient that he is absolutely going to have to be rigorous and offloading this area. I change the primary dressing to silver alginate Ryan CarrowMOORE, [name] 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 He was treated with 6 weeks worth of IV antibiotics and he was at [facility] skilled facility. This almost closed although the last time he was here there was some more depth and probing to bone. Since then on 10/1 he was discharged to home where he is being cared for by his sister. He was hospitalized from 08/07/2019 through 08/13/2019 with acute on chronic respiratory failure secondary to known interstitial lung disease and chronic combined congestive heart failure. His echocardiogram showed an ejection fraction of 20 to 25% he had PCI done with a stent of the right coronary artery. He is returned home and his sister is with him today. She reports that the wound is a lot worse since his hospitalization. There is clearly large areas of exposed bone in the area is a lot larger some tenderness they have been using silver alginate. He apparently is eating better. He is not incontinent able to get up and transfer himself. He has a recliner but he is sleeping in the bed 10/28. Continued deterioration in this wound now with a wide area of exposed bone.  Swab culture I did last time showed a few Pseudomonas I gave him 7 days of cefdinir. However with continued deterioration today I think he needs to be reevaluated for recurrence of the underlying acute on chronic osteomyelitis. I have ordered a CT scan as I think it would be difficult to get him through an MRI. They did a CT scan to diagnosed as the last time. I am going to bring him back next week for a bone biopsy and culture 11/4; not much change in the wound today. He completed his cefdinir about a week ago. There is no question that he requires bone for biopsy and culture. An area was cleaned today, anesthetized some surface granulation was removed specimens were obtained for culture and biopsy. He will require a CT scan of the pelvis to compare with the one done previously Electronic Signature(s) Signed: 09/08/2019 7:47:21 AM By:  Baltazar Najjar MD Entered By: Baltazar Najjar on 09/07/2019 08:00:50 [name], [name] ([id number]) -------------------------------------------------------------------------------- Physical Exam Details Patient Name: [name], [name] Date of Service: 09/06/2019 3:00 PM Medical Record Number: [id number] Patient Account Number: [id number] Date of Birth/Sex: May 01, 1956 (62 y.o. M) Treating RN: Huel Coventry Primary Care Provider: Sindy Messing Other Clinician: Referring Provider: Sindy Messing Treating Provider/Extender: Maxwell Caul [place] in Treatment: 14 Constitutional . Supine Blood Pressure is within target range for patient.[name] Respirations regular, non-labored and within target range.. Temperature is normal and within the target range for the patient.[name] appears in no distress. Respiratory Respiratory effort is easy and symmetric bilaterally. Rate is normal at rest and on room air.. Cardiovascular He appears to be well-hydrated. Psychiatric Patient appears depressed today.. Notes Wound exam; the wound is about the same as last week. As noted small area of  surface tissue removed with a curette using rongeurs specimen of bone obtained for culture and pathology. I am concerned about his nutritional status although his sister says he eats well. Apparently offloading this as well as possible Electronic Signature(s) Signed: 09/08/2019 7:47:21 AM By: Baltazar Najjar MD Entered By: Baltazar Najjar on 09/07/2019 08:02:12 [name] ([id number]) -------------------------------------------------------------------------------- Physician Orders Details Patient Name: KYLO, OMELIA. Date of Service: 09/06/2019 3:00 PM Medical Record Number: [id number] Patient Account Number: [id number] Date of Birth/Sex: 08/30/1956 (62 y.o. M) Treating RN: Huel Coventry Primary Care Provider: Sindy Messing Other Clinician: Referring Provider: Sindy Messing Treating Provider/Extender: Altamese [place] in Treatment: 14 Verbal / Phone Orders: No Diagnosis Coding Wound Cleansing Wound #2 Right Gluteus o Clean wound with Normal Saline. o Cleanse wound with mild soap and water Anesthetic (add to Medication List) Wound #2 Right Gluteus o Topical Lidocaine 4% cream applied to wound bed prior to debridement (In Clinic Only). Skin Barriers/Peri-Wound Care Wound #2 Right Gluteus o Skin Prep - to peri-wound Primary Wound Dressing Wound #2 Right Gluteus o Silver Alginate - packed lighty Secondary Dressing Wound #2 Right Gluteus o Boardered Foam Dressing Dressing Change Frequency Wound #2 Right Gluteus o Change dressing every day. - Monday, Wednesday, Friday by home health. May skip Wednesday if patient has wound care visit. Homehealth, please train patient's sister for additional days. Follow-up Appointments Wound #2 Right Gluteus o Return Appointment in 1 week. Off-Loading Wound #2 Right Gluteus o Mattress o Turn and reposition every 2 hours Home Health Wound #2 Right Gluteus o Continue Home Health Visits - [facility]  - o Home Health Nurse may visit PRN to address patientos wound care needs. o FACE TO FACE ENCOUNTER: MEDICARE and MEDICAID PATIENTS: I certify that this patient is under my care and that I had a face-to-face encounter that meets the physician face-to-face encounter requirements with this [name], [name] ([id number]) patient on this date. The encounter with the patient was in whole or in part for the following MEDICAL CONDITION: (primary reason for Home Healthcare) MEDICAL NECESSITY: I certify, that based on my findings, NURSING services are a medically necessary home health service. HOME BOUND STATUS: I certify that my clinical findings support that this patient is homebound (i.e., Due to illness or injury, pt requires aid of supportive devices such as crutches, cane, wheelchairs, walkers, the use of special transportation or the assistance of another person to leave their place of residence. There is a normal inability to leave the home and doing so requires considerable and taxing effort. Other absences are for medical reasons / religious services and are infrequent or of  short duration when for other reasons). o If current dressing causes regression in wound condition, may D/C ordered dressing product/s and apply Normal Saline Moist Dressing daily until next Wound Healing Center / Other MD appointment. Notify Wound Healing Center of regression in wound condition at [phone]. o Please direct any NON-WOUND related issues/requests for orders to patient's Primary Care Physician Medications-please add to medication list. Wound #2 Right Gluteus o Other: - Nutritional supplement daily Laboratory o Bacteria identified in Wound by Culture (MICRO) - Bone-Sacrum oooo LOINC Code: 6462-6 oooo Convenience Name: Wound culture routine o Bacteria identified in Tissue by Biopsy culture (MICRO) - Bone-Sacrum oooo LOINC Code: 09811-9 oooo Convenience Name: Biopsy specimen  culture Electronic Signature(s) Signed: 09/07/2019 5:19:38 PM By: Elliot Gurney, BSN, RN, CWS, Kim RN, BSN Signed: 09/08/2019 7:47:21 AM By: Baltazar Najjar MD Entered By: Elliot Gurney, BSN, RN, CWS, Kim on 09/06/2019 15:43:26 [name], Ryan ([id number]) -------------------------------------------------------------------------------- Prescription 09/06/2019 Patient Name: [name] Provider: Maxwell Caul MD Date of Birth: [birth year] NPI#: [id number] Sex: Judie Petit DEA#: [id number] Phone #: [phone] License #: [id number] Patient Address: [facility] [address] [place], [place] [address] [address], [address] [place], [place] [address] [phone] Allergies No Known Allergies Provider's Orders Mattress Signature(s): Date(s): Electronic Signature(s) Signed: 09/07/2019 5:19:38 PM By: Elliot Gurney, BSN, RN, CWS, Kim RN, BSN Signed: 09/08/2019 7:47:21 AM By: Baltazar Najjar MD Entered By: Elliot Gurney, BSN, RN, CWS, Kim on 09/06/2019 15:43:26 Purdy, [name] ([id number]) --------------------------------------------------------------------------------  Problem List Details Patient Name: SHAMARCUS, HOHEISEL. Date of Service: 09/06/2019 3:00 PM Medical Record Number: [id number] Patient Account Number: [id number] Date of Birth/Sex: [birth year] (62 y.o. M) Treating RN: Huel Coventry Primary Care Provider: Sindy Messing Other Clinician: Referring Provider: Sindy Messing Treating Provider/Extender: Altamese [place] in Treatment: 14 Active Problems ICD-10 Evaluated Encounter Code Description Active Date Today Diagnosis L89.314 Pressure ulcer of right buttock, stage 4 05/31/2019 No Yes M86.68 Other chronic osteomyelitis, other site 08/30/2019 No Yes Inactive Problems Resolved Problems Electronic Signature(s) Signed: 09/08/2019 7:47:21 AM By: Baltazar Najjar MD Entered By: Baltazar Najjar on 09/07/2019 07:59:08 Gradillas,  [name] ([id number]) -------------------------------------------------------------------------------- Progress Note Details Patient Name: [name] Date of Service: 09/06/2019 3:00 PM Medical Record Number: [id number] Patient Account Number: [id number] Date of Birth/Sex: April 10, 1956 (62 y.o. M) Treating RN: Huel Coventry Primary Care Provider: Sindy Messing Other Clinician: Referring Provider: Sindy Messing Treating Provider/Extender: Altamese [place] in Treatment: 14 Subjective History of Present Illness (HPI) ADMISSION 06/01/18 This is a 63 year old man who is sent to [place] currently residing at [facility] skilled facility. He has a complicated recent medical history. His sister accompanies him tells [place] that he has been somewhat disabled since suffering a left basal ganglial CVA in 2018. At that point in time he also had myocardial infarctions. More recently he has been hospitalized from 04/04/18 through 04/12/18 with septic shock, coronary artery disease, congestive heart failure and an acute CVA. During this hospitalization he was noted to have sacral wounds but these were not felt to be the source of infection however the source of the infection was never really determined at that point. I believe he was sent to a skilled facility. He reports that at the hospital on 05/09/18 through 05/18/18. At that point he was noted to have a large abscess of the right buttock. He required a surgical IandD by Dr. Gerrit Friends of general surgery. Culture of this grew Citrobacter,Eikenella and Bacteroides. He had  a CT scan of the pelvis that showed an abnormal soft tissue collection just caudal to the right ischial tuberosity measuring 5.9 and by 4.7. This also involved the distal right deep muscles. There was some cortical bone loss on the right ischial tuberosity and there was some concern with osteomyelitis. He was felt to require treatment for osteomyelitis based on clinical and radiographic findings.  He did not have an MRI. Past medical history includes; abdominal aortic aneurysm, carotid stenosis, hypertension, osteoarthritis, interstitial lung disease on chronic steroids, rheumatoid arthritis, congestive heart failure/ischemic cardiomyopathy with an ejection fraction of 35-40% Also noteworthy is on 05/09/18 his albumin was 2.2 on admission to hospital. I don't think this is actually been repeated. The patient states his appetite is good and he is taking supplements 100%. 06/15/18-He is seen in follow up vibration for right buttock ulcer; this is stable in appearance with minimal amount of non-viable tissue. 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 We've been using a wound VAC. This is centered over the right ischial tuberosity. He is making generally good progress. He was severely hypoalbuminemic at one time although he states that he eats well and takes his supplements. 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 We have been using silver collagen over the superior aspect of the right ischial tuberosity under wound VAC.he has made some nice improvements. The dimensions of the wound are certainly smaller. He has no exposed bone. We received lab work from [facility]. His prealbumin was 18.6 and albumin at 2.57. This is obviously still low. I am not sure what interventions are in place. I know he is on supplements. I discussed this with the patient. I told him that the albumin represents already moderate to severe protein malnutrition. This would contribute it to do difficulty healing the wound 07/20/18; I received copies of lab work from [facility] on 07/06/18 documenting a sedimentation rate of 25 which seems to be in line with what I see from previous values however his C-reactive protein was 110.8 versus 84.7  on 06/22/18. I have looked through [facility] Link I don't see a prior value for either inflammatory measure. he has been treated with [name] which I think he is still on for osteomyelitis of the right ischial tuberosity as well as a soft tissue collection just caudad to the right ischial tuberosity itself. Felt to have a soft tissue abscess and/or stigmata of necrotizing fasciitis. I'm not sure who is following this. His CT scan was on 05/09/18 yet he still remains on Invanz. Looking back through the original discharge summary the [name] was supposed to finish on 07/05/18. 08/03/18 the patient continues to make good improvement. The area over the right ischial tuberosity has filled in nicely. His developed some what looks to be candidal skin infection in the periwound. I think they've taken the wound VAC off and are applying nystatin 08/17/18; 2 week follow-up for this resident from [facility]. They resumed the wound VAC. The area continues to fill in nicely. The only problem area is the tissue over the ischial tuberosity itself which needs to come up a bit. For this reason Amous Crewe, Donnavin W. ([id number]) continuing the wound VAC. Otherwise this is doing remarkably well. The patient states that he is eating everything they give him although I don't see a follow-up albumin. 08/31/18;  the patient states he had blood work done today. I do not have these results. He claims to be eating well. He continues with a wound VAC and the wound is making nice progress. 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 This is coming quite nicely he still has one Onalee Hua that has some depth but I think by the next time we see him in 2 weeks we should be able to move on from the wound VAC. He is still at skilled care at [facility]. As far  as I am aware he is eating and drinking well. I have not seen follow-up albumin studies on him recently but the message I'm getting is that he is eating well and taking his supplements. The patient states he is not very mobile and I am hopeful that we are able to insure pressure relief here however if we hadn't been ensuring pressure relief I doubt this would've closed in as well as it has even with the wound VAC. He does not complain of any systemic issues 09/28/2018 for follow-up and management of stage IV sacral ulcer that is being managed with a wound VAC. He is a resident at a long-term care facility, [facility]. The wound continues to have some depth and width some granulation of tissue on the outer borders of the wound. The wound was tender to touch today during exam. He denies any pain to the wound when it is not being touched. Recent fevers, chills, nausea, vomiting, or shortness of breath. Hopeful over the next couple of weeks he will be able to transition from the wound VAC. 10/19/2018; this patient has a stage IV over the right ischial tuberosity. The wound is come to the surface of the skin and looks very healthy at this point. His wound VAC can be discontinued. The patient apparently is eating well taking his supplements. He has no systemic complaints 11/23/2018. The patient has a stage IV wound over his right ischial tuberosity that is now down to a quarter sized superficial area. There is no depth to this. The tissue looks healthy. No debridement was required. Continued improvement. We are using silver alginate and bordered foam 2/19; the original stage IV wound over his right ischial tuberosity. Continues to make nice progress in terms of dimensions. There is no depth to this. We have been using silver alginate and border foam 3/11; this is in follow-up of his original stage IV wound over his right ischial tuberosity. This continues to make nice progress using silver alginate and  border foam vigorous offloading. He is at [facility] skilled facility. Patient complains of terrible pain in his hands and having trouble getting pain medication which he has to ask for as needed. He apparently has a history of rheumatoid arthritis and I note he is on prednisone and chloroquine. His rheumatologist is at [facility] clinic. 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 This continues to make decent progress is smaller. There is some undermining at roughly 12-3 o'clock. We have been using silver alginate 4/29; patient's wound continues to get smaller. The remaining area is epithelializing. The undermining area still is not epithelialized however. We have been using silver collagen since the last time  he is here. He does not come any notes from the facility where he lives [facility] but he claims to be eating well and vigorously offloading this area 5/20-Patient returns to clinic for right ischial wound. No notes available from facility, wound appears to be improving, we have been using Prisma and he has been offloading this area while using his wheelchair in fact he has started to use the walker again READMISSION 05/31/2019 This is a patient we cared for her for a prolonged period of time with a stage IV pressure area over his right ischio tuberosity. 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 We were able to get this to markedly improve with a wound VAC over time. We last saw this patient in clinic on 5/20 we are using silver collagen and a small wound that was just about closed. Per the patient this wound actually did progress to closure although I do not have any independent verification of this from [facility] he [facility]. He states that this reopened about 2 to 3 months ago. His timeframe is off however again I do not have any independent verification. He has a small  wound with a lip lip of tissue over the top of this 8/26; wound is down to a very tiny open area about slight hyper granulation. The area is healing with epithelialization and a bit of a divot. I used silver nitrate. Continue with Hydrofera Blue 9/23; patient arrives today with the wound quite a bit worse. There is a lip of skin and subcutaneous tissue over the majority of the visible part of this wound however this is fairly deep underneath this with exposed bone. At one point he had a large stage IV wound and this with underlying osteomyelitis. We have been using Hydrofera Blue. Also, the patient is being discharged in 8 days to home to live with [place] sister in [place]. There will be a lot of arrangements here. I counseled the patient that he is absolutely going to have to be rigorous and offloading this area. I change the primary dressing to silver alginate Ryan CarrowMOORE, Jairus W. ([id number]) 10/14; this is a patient with a prior history of a stage IV pressure ulcer over the right ischial tuberosity. He was treated with 6 weeks worth of IV antibiotics and he was at [facility] skilled facility. This almost closed although the last time he was here there was some more depth and probing to bone. Since then on 10/1 he was discharged to home where he is being cared for by his sister. He was hospitalized from 08/07/2019 through 08/13/2019 with acute on chronic respiratory failure secondary to known interstitial lung disease and chronic combined congestive heart failure. His echocardiogram showed an ejection fraction of 20 to 25% he had PCI done with a stent of the right coronary artery. He is returned home and his sister is with him today. She reports that the wound is a lot worse since his hospitalization. There is clearly large areas of exposed bone in the area is a lot larger some tenderness they have been using silver alginate. He apparently is eating better. He is not incontinent able to get up and  transfer himself. He has a recliner but he is sleeping in the bed 10/28. Continued deterioration in this wound now with a wide area of exposed bone. Swab culture I did last time showed a few Pseudomonas I gave him 7 days of cefdinir. However with continued deterioration today I think  he needs to be reevaluated for recurrence of the underlying acute on chronic osteomyelitis. I have ordered a CT scan as I think it would be difficult to get him through an MRI. They did a CT scan to diagnosed as the last time. I am going to bring him back next week for a bone biopsy and culture 11/4; not much change in the wound today. He completed his cefdinir about a week ago. There is no question that he requires bone for biopsy and culture. An area was cleaned today, anesthetized some surface granulation was removed specimens were obtained for culture and biopsy. He will require a CT scan of the pelvis to compare with the one done previously Objective Constitutional Supine Blood Pressure is within target range for patient.[name] Respirations regular, non-labored and within target range.. Temperature is normal and within the target range for the patient.[name] appears in no distress. Vitals Time Taken: 3:07 PM, Height: 73 in, Weight: 155 lbs, BMI: 20.4, Temperature: 98.0 F, Pulse: 90 bpm, Respiratory Rate: 18 breaths/min, Blood Pressure: 118/70 mmHg. Respiratory Respiratory effort is easy and symmetric bilaterally. Rate is normal at rest and on room air.. Cardiovascular He appears to be well-hydrated. Psychiatric Patient appears depressed today.. General Notes: Wound exam; the wound is about the same as last week. As noted small area of surface tissue removed with a curette using rongeurs specimen of bone obtained for culture and pathology. I am concerned about his nutritional status although his sister says he eats well. Apparently offloading this as well as possible Integumentary (Hair, Skin) Wound #2 status is  Open. Original cause of wound was Pressure Injury. The wound is located on the Right Gluteus. The wound measures 4.5cm length x 3.4cm width x 1.2cm depth; 12.017cm^2 area and 14.42cm^3 volume. There is bone and Fat Layer (Subcutaneous Tissue) Exposed exposed. There is no tunneling noted, however, there is undermining starting at 12:00 and ending at 7:00 with a maximum distance of 2.6cm. There is a medium amount of serous drainage noted. The wound margin is epibole. There is small (1-33%) pink granulation within the wound bed. There is a large (67-100%) amount of Laseter, Lanell W. ([id number]) necrotic tissue within the wound bed including Adherent Slough. Assessment Active Problems ICD-10 Pressure ulcer of right buttock, stage 4 Other chronic osteomyelitis, other site Procedures Wound #2 Pre-procedure diagnosis of Wound #2 is a Pressure Ulcer located on the Right Gluteus . There was a Excisional Skin/Subcutaneous Tissue/Muscle/Bone Debridement with a total area of 0.4 sq cm performed by Maxwell Caul, MD. With the following instrument(s): Rongeur to remove Viable tissue/material. Material removed includes Bone after achieving pain control using Lidocaine. 2 specimens were taken by a Tissue Culture and sent to the lab per facility protocol. A time out was conducted at 15:35, prior to the start of the procedure. A Moderate amount of bleeding was controlled with Pressure. The procedure was tolerated well. Post Debridement Measurements: 4.5cm length x 3.4cm width x 1.2cm depth; 14.42cm^3 volume. Post debridement Stage noted as Category/Stage IV. Character of Wound/Ulcer Post Debridement requires further debridement. Post procedure Diagnosis Wound #2: Same as Pre-Procedure Plan Wound Cleansing: Wound #2 Right Gluteus: Clean wound with Normal Saline. Cleanse wound with mild soap and water Anesthetic (add to Medication List): Wound #2 Right Gluteus: Topical Lidocaine 4% cream applied to  wound bed prior to debridement (In Clinic Only). Skin Barriers/Peri-Wound Care: Wound #2 Right Gluteus: Skin Prep - to peri-wound Primary Wound Dressing: Wound #2 Right Gluteus: Silver Alginate - packed  lighty Secondary Dressing: Wound #2 Right Gluteus: Boardered Foam Dressing Dressing Change Frequency: Wound #2 Right Gluteus: Change dressing every day. - Monday, Wednesday, Friday by home health. May skip Wednesday if patient has wound care visit. Homehealth, please train patient's sister for additional days. NAZARETH, KIRK. ([id number]) Follow-up Appointments: Wound #2 Right Gluteus: Return Appointment in 1 week. Off-Loading: Wound #2 Right Gluteus: Mattress Turn and reposition every 2 hours Home Health: Wound #2 Right Gluteus: Continue Home Health Visits - [facility] - Home Health Nurse may visit PRN to address patient s wound care needs. FACE TO FACE ENCOUNTER: MEDICARE and MEDICAID PATIENTS: I certify that this patient is under my care and that I had a face-to-face encounter that meets the physician face-to-face encounter requirements with this patient on this date. The encounter with the patient was in whole or in part for the following MEDICAL CONDITION: (primary reason for Home Healthcare) MEDICAL NECESSITY: I certify, that based on my findings, NURSING services are a medically necessary home health service. HOME BOUND STATUS: I certify that my clinical findings support that this patient is homebound (i.e., Due to illness or injury, pt requires aid of supportive devices such as crutches, cane, wheelchairs, walkers, the use of special transportation or the assistance of another person to leave their place of residence. There is a normal inability to leave the home and doing so requires considerable and taxing effort. Other absences are for medical reasons / religious services and are infrequent or of short duration when for other reasons). If current dressing causes  regression in wound condition, may D/C ordered dressing product/s and apply Normal Saline Moist Dressing daily until next Wound Healing Center / Other MD appointment. Notify Wound Healing Center of regression in wound condition at [phone]. Please direct any NON-WOUND related issues/requests for orders to patient's Primary Care Physician Medications-please add to medication list.: Wound #2 Right Gluteus: Other: - Nutritional supplement daily Laboratory ordered were: Wound culture routine - Bone-Sacrum, Biopsy specimen culture - Bone-Sacrum #1 I continued with the silver alginate dressing 2. Specimen of bone for pathology and culture. No additional antibiotics. He finished the cefdinir about a week ago. 3. Rapid deterioration in this wound with exposed bone dictates a high likelihood of recurrent osteomyelitis. 4. I am somewhat concerned about his nutritional status although as far as I am aware he is eating well. He is going to need repeat lab work at some point. 5. We ordered a CT scan of this area last week this is tangled up in Medicaid. Apparently they have a 2-week window to approve or deny Electronic Signature(s) Signed: 09/08/2019 7:47:21 AM By: Baltazar Najjar MD Entered By: Baltazar Najjar on 09/07/2019 08:03:46 Manke, [name] ([id number]) -------------------------------------------------------------------------------- SuperBill Details Patient Name: [name] Date of Service: 09/06/2019 Medical Record Number: [id number] Patient Account Number: [id number] Date of Birth/Sex: 03-Oct-1956 (63 y.o. M) Treating RN: Huel Coventry Primary Care Provider: Sindy Messing Other Clinician: Referring Provider: Sindy Messing Treating Provider/Extender: Altamese [place] in Treatment: 14 Diagnosis Coding ICD-10 Codes Code Description L89.314 Pressure ulcer of right buttock, stage 4 M86.68 Other chronic osteomyelitis, other site Facility Procedures CPT4 Code:  [id number] Description: 11044 - DEB BONE 20 SQ CM/< ICD-10 Diagnosis Description L89.314 Pressure ulcer of right buttock, stage 4 M86.68 Other chronic osteomyelitis, other site Modifier: Quantity: 1 Physician Procedures CPT4: Description Modifier Quantity Code [id number] Debridement; bone (includes epidermis, dermis, subQ tissue, muscle and/or fascia, if 1 performed) 1st 20 sqcm or less ICD-10 Diagnosis Description  L89.314 Pressure ulcer of right buttock, stage 4 M86.68  Other chronic osteomyelitis, other site Electronic Signature(s) Signed: 09/08/2019 7:47:21 AM By: Baltazar Najjar MD Entered By: Baltazar Najjar on 09/07/2019 08:04:01

## 2019-09-08 NOTE — Telephone Encounter (Signed)
Ok thanks 

## 2019-09-08 NOTE — Progress Notes (Addendum)
DAVISON, OHMS ([id number]) Visit Report for 09/06/2019 Arrival Information Details Patient Name: [name], [name] Date of Service: 09/06/2019 3:00 PM Medical Record Number: [id number] Patient Account Number: [id number] Date of Birth/Sex: 1956/02/23 (62 y.o. M) Treating RN: Cornell Barman Primary Care Alivya Wegman: Domenica Fail Other Clinician: Referring Reann Dobias: Domenica Fail Treating Lakecia Deschamps/Extender: Tito Dine in Treatment: 14 Visit Information History Since Last Visit Added or deleted any medications: No Patient Arrived: Wheel Chair Any new allergies or adverse reactions: No Arrival Time: 15:05 Had a fall or experienced change in No Accompanied By: sister activities of daily living that may affect Transfer Assistance: Manual risk of falls: Patient Identification Verified: Yes Signs or symptoms of abuse/neglect since last visito No Secondary Verification Process Completed: Yes Hospitalized since last visit: No Implantable device outside of the clinic excluding No cellular tissue based products placed in the center since last visit: Has Dressing in Place as Prescribed: Yes Pain Present Now: No Electronic Signature(s) Signed: 09/07/2019 5:19:38 PM By: Gretta Cool, BSN, RN, CWS, Kim RN, BSN Entered By: Gretta Cool, BSN, RN, CWS, Kim on 09/06/2019 15:07:03 [name] ([id number]) -------------------------------------------------------------------------------- Lower Extremity Assessment Details Patient Name: [name], [name] Date of Service: 09/06/2019 3:00 PM Medical Record Number: [id number] Patient Account Number: [id number] Date of Birth/Sex: August 27, 1956 (62 y.o. M) Treating RN: Harold Barban Primary Care Diem Pagnotta: Domenica Fail Other Clinician: Referring Mercy Leppla: Domenica Fail Treating Caidence Higashi/Extender: Ricard Dillon Weeks in Treatment: 14 Electronic Signature(s) Signed: 09/07/2019 4:15:06 PM By: Harold Barban Entered By: Harold Barban on 09/06/2019  15:16:36 [name], Johnston W. ([id number]) -------------------------------------------------------------------------------- Multi Wound Chart Details Patient Name: [name] Date of Service: 09/06/2019 3:00 PM Medical Record Number: [id number] Patient Account Number: [id number] Date of Birth/Sex: 13-Jun-1956 (62 y.o. M) Treating RN: Cornell Barman Primary Care Sharlize Hoar: Domenica Fail Other Clinician: Referring Adi Seales: Domenica Fail Treating Armonte Tortorella/Extender: Tito Dine in Treatment: 14 Vital Signs Height(in): 73 Pulse(bpm): 90 Weight(lbs): 155 Blood Pressure(mmHg): 118/70 Body Mass Index(BMI): 20 Temperature(F): 98.0 Respiratory Rate 18 (breaths/min): Photos: [N/A:N/A] Wound Location: Right Gluteus N/A N/A Wounding Event: Pressure Injury N/A N/A Primary Etiology: Pressure Ulcer N/A N/A Comorbid History: Cataracts, Congestive Heart N/A N/A Failure, Coronary Artery Disease, Hypertension, Myocardial Infarction, History of pressure wounds, Rheumatoid Arthritis Date Acquired: 05/03/2019 N/A N/A Weeks of Treatment: 14 N/A N/A Wound Status: Open N/A N/A Measurements L x W x D 4.5x3.4x1.2 N/A N/A (cm) Area (cm) : 12.017 N/A N/A Volume (cm) : 14.42 N/A N/A % Reduction in Area: -2451.40% N/A N/A % Reduction in Volume: -15240.40% N/A N/A Starting Position 1 12 (o'clock): Ending Position 1 7 (o'clock): Maximum Distance 1 (cm): 2.6 Undermining: Yes N/A N/A Classification: Category/Stage IV N/A N/A Exudate Amount: Medium N/A N/A Exudate Type: Serous N/A N/A Exudate Color: amber N/A N/A Wound Margin: Epibole N/A N/A Iannello, Akio W. ([id number]) Granulation Amount: Small (1-33%) N/A N/A Granulation Quality: Pink N/A N/A Necrotic Amount: Large (67-100%) N/A N/A Exposed Structures: Fat Layer (Subcutaneous N/A N/A Tissue) Exposed: Yes Bone: Yes Fascia: No Tendon: No Muscle: No Joint: No Epithelialization: Small (1-33%) N/A N/A Debridement: Debridement -  Excisional N/A N/A Pre-procedure 15:35 N/A N/A Verification/Time Out Taken: Pain Control: Lidocaine N/A N/A Tissue Debrided: Bone N/A N/A Level: Skin/Subcutaneous N/A N/A Tissue/Muscle/Bone Debridement Area (sq cm): 0.4 N/A N/A Instrument: Rongeur N/A N/A Bleeding: Moderate N/A N/A Hemostasis Achieved: Pressure N/A N/A Debridement Treatment Procedure was tolerated well N/A N/A Response: Post Debridement 4.5x3.4x1.2 N/A N/A Measurements L x W x D (cm) Post Debridement Volume: 14.42  N/A N/A (cm) Post Debridement Stage: Category/Stage IV N/A N/A Procedures Performed: Debridement N/A N/A Treatment Notes Electronic Signature(s) Signed: 09/08/2019 7:47:21 AM By: Baltazar Najjar MD Entered By: Baltazar Najjar on 09/07/2019 07:59:13 Soza, [name] ([id number]) -------------------------------------------------------------------------------- Multi-Disciplinary Care Plan Details Patient Name: [name], [name] Date of Service: 09/06/2019 3:00 PM Medical Record Number: [id number] Patient Account Number: [id number] Date of Birth/Sex: [birth year] (62 y.o. M) Treating RN: Huel Coventry Primary Care Hilmer Aliberti: Sindy Messing Other Clinician: Referring Aynsley Fleet: Sindy Messing Treating Caden Fukushima/Extender: Altamese [place] in Treatment: 14 Active Inactive Electronic Signature(s) Signed: 09/15/2019 2:48:45 PM By: Elliot Gurney, BSN, RN, CWS, Kim RN, BSN Previous Signature: 09/07/2019 5:19:38 PM Version By: Elliot Gurney, BSN, RN, CWS, Kim RN, BSN Entered By: Elliot Gurney, BSN, RN, CWS, Kim on 09/15/2019 14:48:45 [name] ([id number]) -------------------------------------------------------------------------------- Pain Assessment Details Patient Name: [name], [name] Date of Service: 09/06/2019 3:00 PM Medical Record Number: [id number] Patient Account Number: [id number] Date of Birth/Sex: 10/08/1956 (62 y.o. M) Treating RN: Huel Coventry Primary Care Ardys H[place]: Sindy Messing Other Clinician: Referring  Valjean Ruppel: Sindy Messing Treating Shanea Karney/Extender: Altamese [place] in Treatment: 14 Active Problems Location of Pain Severity and Description of Pain Patient Has Paino No Site Locations Pain Management and Medication Current Pain Management: Electronic Signature(s) Signed: 09/07/2019 5:19:38 PM By: Elliot Gurney, BSN, RN, CWS, Kim RN, BSN Entered By: Elliot Gurney, BSN, RN, CWS, Kim on 09/06/2019 15:07:11 [name] ([id number]) -------------------------------------------------------------------------------- Wound Assessment Details Patient Name: [name], [name] Date of Service: 09/06/2019 3:00 PM Medical Record Number: [id number] Patient Account Number: [id number] Date of Birth/Sex: 04-11-1956 (62 y.o. M) Treating RN: Arnette Norris Primary Care Siera Beyersdorf: Sindy Messing Other Clinician: Referring Angeline Trick: Sindy Messing Treating Shyana Kulakowski/Extender: Altamese [place] in Treatment: 14 Wound Status Wound Number: 2 Primary Pressure Ulcer Etiology: Wound Location: Right Gluteus Wound Open Wounding Event: Pressure Injury Status: Date Acquired: 05/03/2019 Comorbid Cataracts, Congestive Heart Failure, Coronary Weeks Of Treatment: 14 History: Artery Disease, Hypertension, Myocardial Clustered Wound: No Infarction, History of pressure wounds, Rheumatoid Arthritis Photos Wound Measurements Length: (cm) 4.5 % Reduction Width: (cm) 3.4 % Reduction Depth: (cm) 1.2 Epithelializ Area: (cm) 12.017 Tunneling: Volume: (cm) 14.42 Undermining Starting Ending Po Maximum D in Area: -2451.4% in Volume: -15240.4% ation: Small (1-33%) No : Yes Position (o'clock): 12 sition (o'clock): 7 istance: (cm) 2.6 Wound Description Classification: Category/Stage IV Foul Odor Af Wound Margin: Epibole Slough/Fibri Exudate Amount: Medium Exudate Type: Serous Exudate Color: amber ter Cleansing: No no No Wound Bed Granulation Amount: Small (1-33%) Exposed Structure Granulation Quality:  Pink Fascia Exposed: No Necrotic Amount: Large (67-100%) Fat Layer (Subcutaneous Tissue) Exposed: Yes Necrotic Quality: Adherent Slough Tendon Exposed: No Muscle Exposed: No [name], [name] ([id number]) Joint Exposed: No Bone Exposed: Yes Electronic Signature(s) Signed: 09/07/2019 4:15:06 PM By: Arnette Norris Entered By: Arnette Norris on 09/06/2019 15:15:46 [name], [name] ([id number]) -------------------------------------------------------------------------------- Vitals Details Patient Name: [name] Date of Service: 09/06/2019 3:00 PM Medical Record Number: [id number] Patient Account Number: [id number] Date of Birth/Sex: 10-03-1956 (62 y.o. M) Treating RN: Huel Coventry Primary Care Sy Saintjean: Sindy Messing Other Clinician: Referring Maxwel Meadowcroft: Sindy Messing Treating Syna Gad/Extender: Altamese [place] in Treatment: 14 Vital Signs Time Taken: 15:07 Temperature (F): 98.0 Height (in): 73 Pulse (bpm): 90 Weight (lbs): 155 Respiratory Rate (breaths/min): 18 Body Mass Index (BMI): 20.4 Blood Pressure (mmHg): 118/70 Reference Range: 80 - 120 mg / dl Electronic Signature(s) Signed: 09/07/2019 5:19:38 PM By: Elliot Gurney, BSN, RN, CWS, Kim RN, BSN Entered By: Elliot Gurney, BSN, RN, CWS, Kim on  09/06/2019 15:07:37 

## 2019-09-09 ENCOUNTER — Emergency Department (HOSPITAL_COMMUNITY)
Admission: EM | Admit: 2019-09-09 | Discharge: 2019-10-03 | Disposition: E | Payer: Medicaid Other | Attending: Emergency Medicine | Admitting: Emergency Medicine

## 2019-09-09 DIAGNOSIS — I10 Essential (primary) hypertension: Secondary | ICD-10-CM | POA: Diagnosis not present

## 2019-09-09 DIAGNOSIS — Z79899 Other long term (current) drug therapy: Secondary | ICD-10-CM | POA: Diagnosis not present

## 2019-09-09 DIAGNOSIS — Z87891 Personal history of nicotine dependence: Secondary | ICD-10-CM | POA: Insufficient documentation

## 2019-09-09 DIAGNOSIS — Z20828 Contact with and (suspected) exposure to other viral communicable diseases: Secondary | ICD-10-CM | POA: Diagnosis not present

## 2019-09-09 DIAGNOSIS — I469 Cardiac arrest, cause unspecified: Secondary | ICD-10-CM | POA: Diagnosis not present

## 2019-09-09 DIAGNOSIS — I2510 Atherosclerotic heart disease of native coronary artery without angina pectoris: Secondary | ICD-10-CM | POA: Insufficient documentation

## 2019-09-09 DIAGNOSIS — Z7902 Long term (current) use of antithrombotics/antiplatelets: Secondary | ICD-10-CM | POA: Diagnosis not present

## 2019-09-09 DIAGNOSIS — Z7982 Long term (current) use of aspirin: Secondary | ICD-10-CM | POA: Diagnosis not present

## 2019-09-09 LAB — AEROBIC CULTURE W GRAM STAIN (SUPERFICIAL SPECIMEN): Culture: NO GROWTH

## 2019-09-09 LAB — SARS CORONAVIRUS 2 (TAT 6-24 HRS): SARS Coronavirus 2: NEGATIVE

## 2019-09-09 MED ORDER — EPINEPHRINE 1 MG/10ML IJ SOSY
PREFILLED_SYRINGE | INTRAMUSCULAR | Status: AC | PRN
  Administered 2019-09-09: 1 via INTRAVENOUS

## 2019-09-09 MED FILL — Medication: Qty: 1 | Status: AC

## 2019-09-09 NOTE — ED Notes (Signed)
Chaplain paged  

## 2019-09-09 NOTE — Consult Note (Signed)
Responded to page, met with 3 family members in consultation room after pt passed: sister and son nearest of kin. Pt had lived with sister. She is the one who's taking charge with appreciation of son. Offered prayer, emotional/ spiritual/ grief support. The two men waited outside while sister alone said last goodbye to pt in [place]. Escorted her back to family outside, gave her card with # to call when they know funeral arrangements, obtained sister's amd  son's contact information and gave it to ED reception desk inside.  Rev. Eloise Levels Chaplain

## 2019-09-09 NOTE — ED Notes (Signed)
Pulse check, no pulses, CPR resumed

## 2019-09-09 NOTE — Code Documentation (Addendum)
pluse check, no pulse. cpr resumed

## 2019-09-09 NOTE — ED Provider Notes (Addendum)
[facility] EMERGENCY DEPARTMENT Provider Note   CSN: [id number] Arrival date & time: 09/29/2019  1103     History   Chief Complaint No chief complaint on file.   HPI [name] is a 63 y.o. male.     The history is provided by the patient and medical records. No language interpreter was used.  Cardiac Arrest Witnessed by:  Family member Incident location:  Home Time since incident:  1 hour Time before BLS initiated:  Immediate Condition upon EMS arrival:  Agonal respirations Pulse:  Absent Initial cardiac rhythm per EMS:  PEA Airway:  Intubation prior to arrival Rhythm on admission to ED:  PEA Associated symptoms: shortness of breath     Past Medical History:  Diagnosis Date  . AAA (abdominal aortic aneurysm) (HCC)   . AKI (acute kidney injury) (HCC)    a. Cr up to 8 in 04/2018.  . Arthritis   . CAD in native artery    a. h/o MI x 3 s/p CABG x 4 (1993 with Tyrone Sage). b. LHC 01/2017 with severe native disease, 2 grafts occluded.  . Carotid artery disease (HCC)    a. s/p R CEA 11/2016.  [name] Chronic combined systolic and diastolic CHF (congestive heart failure) (HCC)   . COPD (chronic obstructive pulmonary disease) (HCC)   . GERD (gastroesophageal reflux disease)   . Hyperkalemia   . ILD (interstitial lung disease) (HCC)   . Lung nodule   . MI (myocardial infarction) (HCC)    x 3   . Normocytic anemia   . NSVT (nonsustained ventricular tachycardia) (HCC)   . Pancreatitis    1/19  . Pneumonia 11/2017  . RA (rheumatoid arthritis) (HCC)   . Stroke (HCC)    a. 2018. b. possible recurrence in 04/2018, seen by neuro  . Tobacco abuse     Patient Active Problem List   Diagnosis Date Noted  . Acute osteomyelitis of pelvic region and thigh, right (HCC) 08/04/2018  . Pressure injury of skin 06/06/2018  . Complicated UTI (urinary tract infection)   . Cholecystitis 06/04/2018  . Severe sepsis (HCC)   . NSVT (nonsustained ventricular tachycardia)  (HCC) 05/14/2018  . Adrenal insufficiency (HCC): Relative 05/11/2018  . Abscess of buttock 05/11/2018  . AKI (acute kidney injury) (HCC)   . Urinary retention   . Rheumatoid arthritis (HCC)   . Chronic obstructive pulmonary disease (HCC)   . Supplemental oxygen dependent   . Tobacco abuse   . AAA (abdominal aortic aneurysm) without rupture (HCC)   . History of CVA with residual deficit   . Acute blood loss anemia   . Hyperkalemia   . Cerebral thrombosis with cerebral infarction 04/11/2018  . Sacral ulcer, with unspecified severity (HCC) 04/05/2018  . Pressure injury of skin of sacral region   . Severe comorbid illness   . Acute renal failure (HCC)   . Shock (HCC) 04/04/2018  . High anion gap metabolic acidosis 04/04/2018  . Acute kidney injury (HCC) 04/04/2018  . Cervicalgia 01/18/2018  . Chronic pain of both shoulders 01/18/2018  . Hx of rheumatoid arthritis 01/18/2018  . Chronic pain syndrome 01/18/2018  . Leukocytosis 12/15/2017  . Cough   . Pneumonia 11/01/2017  . Elevated lipase   . Elevated liver enzymes   . Acute pancreatitis 08/08/2017  . Carotid artery disease (HCC) 05/12/2017  . 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 Chronic combined systolic and diastolic CHF (congestive heart failure) (HCC) 03/07/2017  .  S/P CABG (coronary artery bypass graft)   . Pure hypercholesterolemia   . Acute combined systolic and diastolic heart failure ([place])   . Exertional shortness of breath   . Solitary pulmonary nodule   . Atherosclerotic heart disease of native coronary artery with other forms of angina pectoris ([place]) 02/24/2017  . ILD (interstitial lung disease) ([place]) 02/24/2017  . Anemia 02/24/2017  . AAA (abdominal aortic aneurysm) ([place]) 02/24/2017  . Abnormal EKG 02/24/2017  . Acute on chronic respiratory failure with hypoxia ([place]) 02/24/2017  . History of stroke 02/24/2017  . HTN (hypertension) 02/24/2017  . Current smoker 02/24/2017    Past  Surgical History:  Procedure Laterality Date  . CARDIAC CATHETERIZATION    . CAROTID ENDARTERECTOMY Left   . CORONARY STENT INTERVENTION N/A 08/10/2019   Procedure: CORONARY STENT INTERVENTION;  Surgeon: Lorretta Harp, MD;  Location: [place] [facility];  Service: Cardiovascular;  Laterality: N/A;  . Heart Bypass  10/1992  . INCISION AND DRAINAGE PERIRECTAL ABSCESS Right 05/09/2018   Procedure: IRRIGATION AND DEBRIDEMENT  RIGHT BUTTOCK ABSCESS;  Surgeon: Armandina Gemma, MD;  Location: [facility];  Service: General;  Laterality: Right;  . LEFT HEART CATH AND CORS/GRAFTS ANGIOGRAPHY N/A 03/01/2017   Procedure: Left Heart Cath and Cors/Grafts Angiography;  Surgeon: Belva Crome, MD;  Location: [place] [facility];  Service: Cardiovascular;  Laterality: N/A;  . RIGHT/LEFT HEART CATH AND CORONARY/GRAFT ANGIOGRAPHY N/A 08/10/2019   Procedure: RIGHT/LEFT HEART CATH AND CORONARY/GRAFT ANGIOGRAPHY;  Surgeon: Larey Dresser, MD;  Location: [place] [facility];  Service: Cardiovascular;  Laterality: N/A;  . Throat Biopsy     Cat scratch fever        Home Medications    Prior to Admission medications   Medication Sig Start Date End Date Taking? Authorizing Provider  acetaminophen (TYLENOL) 500 MG tablet Take 1,000 mg by mouth every 6 (six) hours as needed for mild pain.     [provider]  albuterol (PROVENTIL HFA) 108 (90 Base) MCG/ACT inhaler Inhale 2 puffs into the lungs every 6 (six) hours as needed for wheezing or shortness of breath. 08/13/19   Donne Hazel, MD  albuterol (PROVENTIL) (2.5 MG/3ML) 0.083% nebulizer solution Take 3 mLs (2.5 mg total) by nebulization every 6 (six) hours as needed for wheezing or shortness of breath. 08/13/19   Donne Hazel, MD  aspirin EC 81 MG tablet Take 81 mg by mouth daily.  12/03/16   [provider]  atorvastatin (LIPITOR) 40 MG tablet Take 1 tablet (40 mg total) by mouth daily at 6 PM. Patient taking differently: Take 80 mg by mouth daily at  6 PM.  03/15/17   Dunn, Nedra Hai, PA-C  buPROPion (WELLBUTRIN SR) 100 MG 12 hr tablet Take 100 mg by mouth 2 (two) times daily.    [provider]  carvedilol (COREG) 3.125 MG tablet Take 1 tablet (3.125 mg total) by mouth 2 (two) times daily with a meal. 08/13/19 09/12/19  Donne Hazel, MD  cholecalciferol (VITAMIN D3) 25 MCG (1000 UT) tablet Take 1,000 Units by mouth daily.    [provider]  clopidogrel (PLAVIX) 75 MG tablet Take 1 tablet (75 mg total) by mouth daily with breakfast. 08/14/19 09/13/19  Donne Hazel, MD  furosemide (LASIX) 40 MG tablet Take 1 tablet (40 mg total) by mouth daily. 08/14/19 09/13/19  Donne Hazel, MD  gabapentin (NEURONTIN) 300 MG capsule Take 1 capsule (300 mg total) by mouth every 8 (  eight) hours. 01/18/18   Edward JollyLateef, Bilal, MD  guaiFENesin (MUCINEX) 600 MG 12 hr tablet Take 600 mg by mouth 2 (two) times daily as needed for cough or to loosen phlegm.    [provider]  HYDROcodone-acetaminophen (NORCO) 10-325 MG tablet Take 1 tablet by mouth every 8 (eight) hours as needed for severe pain. Must last 30 days. 08/31/19 09/30/19  Edward JollyLateef, Bilal, MD  HYDROcodone-acetaminophen (NORCO) 10-325 MG tablet Take 1 tablet by mouth every 8 (eight) hours as needed for severe pain. Must last 30 days. 09/30/19 10/30/19  Edward JollyLateef, Bilal, MD  HYDROcodone-acetaminophen (NORCO) 10-325 MG tablet Take 1 tablet by mouth every 8 (eight) hours as needed for severe pain. Must last 30 days. 10/30/19 11/29/19  Edward JollyLateef, Bilal, MD  hydroxychloroquine (PLAQUENIL) 200 MG tablet Take 1 tablet (200 mg total) by mouth 2 (two) times daily. 06/22/18   Rodolph Bonghompson, Daniel V, MD  magnesium hydroxide (MILK OF MAGNESIA) 400 MG/5ML suspension Take 30 mLs by mouth daily as needed for mild constipation. And at bedtime    [provider]  Menthol-Methyl Salicylate (MUSCLE RUB EX) Apply 1 application topically as needed (for shoulder or knee pain).     [provider]   mirtazapine (REMERON) 7.5 MG tablet Take 7.5 mg by mouth at bedtime.    [provider]  nutrition supplement, JUVEN, (JUVEN) PACK Take 1 packet by mouth 2 (two) times daily between meals. 05/18/18   Rodolph Bonghompson, Daniel V, MD  nystatin cream (MYCOSTATIN) Apply topically 2 (two) times daily. Patient taking differently: Apply 1 application topically 2 (two) times daily.  05/18/18   Rodolph Bonghompson, Daniel V, MD  polyethylene glycol [facility](MIRALAX / Ethelene HalGLYCOLAX) packet Take 17 g by mouth 2 (two) times daily. Until having 1 soft BM daily, then can decrease to daily or as needed Patient taking differently: Take 17 g by mouth daily. Until having 1 soft BM daily, then can decrease to daily or as needed 04/12/18   Zigmund DanielPowell, A [name] Jr., MD  potassium chloride SA (K-DUR,KLOR-CON) 20 MEQ tablet Take 2 tablets (40 mEq total) by mouth daily. Patient taking differently: Take 20 mEq by mouth 2 (two) times daily.  05/19/18   Rodolph Bonghompson, Daniel V, MD  saccharomyces boulardii (FLORASTOR) 250 MG capsule Take 1 capsule (250 mg total) by mouth 2 (two) times daily. 05/18/18   Rodolph Bonghompson, Daniel V, MD  spironolactone (ALDACTONE) 25 MG tablet Take 0.5 tablets (12.5 mg total) by mouth at bedtime. 08/13/19   Jerald Kiefhiu, Stephen K, MD  tamsulosin (FLOMAX) 0.4 MG CAPS capsule Take 1 capsule (0.4 mg total) by mouth daily after supper. 06/07/18   Shon HaleEmokpae, Courage, MD    Family History Family History  Problem Relation Age of Onset  . Heart murmur Mother   . Heart disease Mother   . Heart attack Mother   . Suicidality Father   . Heart attack Father   . Stroke Brother   . Heart attack Brother   . Heart attack Maternal Grandmother   . Cancer Maternal Grandmother        unknown type  . Lung cancer Maternal Aunt   . Lung cancer Maternal Uncle   . Cancer Maternal Aunt        unknown cancer  . Heart attack Brother     Social History Social History   Tobacco Use  . Smoking status: Former Smoker    Packs/day: 1.00    Years: 47.00    Pack  years: 47.00    Types: Cigarettes  Quit date: 2019    Years since quitting: 1.8  . Smokeless tobacco: Never Used  . Tobacco comment: quit 2019  Substance Use Topics  . Alcohol use: No    Comment: remote history of beer drinking  . Drug use: No     Allergies   Patient has no known allergies.   Review of Systems Review of Systems  Unable to perform ROS: Patient unresponsive  Respiratory: Positive for shortness of breath.      Physical Exam Updated Vital Signs There were no vitals taken for this visit.  Physical Exam Vitals signs and nursing note reviewed.  Constitutional:      General: He is in acute distress.  HENT:     Nose: No congestion.     Mouth/Throat:     Pharynx: No oropharyngeal exudate.  Cardiovascular:     Rate and Rhythm: Normal rate.     Comments: PEA arrest  Pulmonary:     Effort: Bradypnea present.     Breath sounds: No wheezing, rhonchi or rales.     Comments: Patient is having some agonal breathing but is otherwise apneic. Chest:     Chest wall: No tenderness.       Comments: Breath sounds equal bilaterally. Abdominal:     Tenderness: There is no abdominal tenderness.  Neurological:     Mental Status: He is unresponsive.     GCS: GCS eye subscore is 1. GCS verbal subscore is 1. GCS motor subscore is 1.     Motor: Abnormal muscle tone present.     Comments: GCS 3 on arrival with fixed and dilated pupils.  Occasional spastic twitches but no meaningful movement of any extremities to pain.  No gag.  No blink reflex.      ED Treatments / Results  Labs (all labs ordered are listed, but only abnormal results are displayed) Labs Reviewed  SARS CORONAVIRUS 2 (TAT 6-24 HRS)    EKG EKG Interpretation  Date/Time:  Saturday 2019/10/04 11:18:14 EST Ventricular Rate:  67 PR Interval:    QRS Duration: 199 QT Interval:  509 QTC Calculation: 538 R Axis:   94 Text Interpretation: Atrial fibrillation Nonspecific intraventricular  conduction delay Repol abnrm suggests ischemia, anterolateral PEA arrest. Confirmed by Theda Belfast ([id number]) on 10/04/2019 11:26:01 AM   Radiology No results found.  Procedures Procedures (including critical care time)  CRITICAL CARE Performed by: Canary Brim Xin Klawitter Total critical care time: 20 minutes Critical care time was exclusive of separately billable procedures and treating other patients. Critical care was necessary to treat or prevent imminent or life-threatening deterioration. Critical care was time spent personally by me on the following activities: development of treatment plan with patient and/or surrogate as well as nursing, discussions with consultants, evaluation of patient's response to treatment, examination of patient, obtaining history from patient or surrogate, ordering and performing treatments and interventions, ordering and review of laboratory studies, ordering and review of radiographic studies, pulse oximetry and re-evaluation of patient's condition.  Cardiopulmonary Resuscitation (CPR) Procedure Note Directed/Performed by: Canary Brim Harve Spradley I personally directed ancillary staff and/or performed CPR in an effort to regain return of spontaneous circulation and to maintain cardiac, neuro and systemic perfusion.    Medications Ordered in ED Medications  EPINEPHrine (ADRENALIN) 1 MG/10ML injection (1 Syringe Intravenous Given 10/04/2019 1107)     Initial Impression / Assessment and Plan / ED Course  I have reviewed the triage vital signs and the nursing notes.  Pertinent labs & imaging results  that were available during my care of the patient were reviewed by me and considered in my medical decision making (see chart for details).        [name] is a 63 y.o. male with a past medical history significant for CAD status post PCI last month, prior AAA, stroke, prior pancreatitis, GERD, interstitial lung disease, and CHF who presents as a CPR in  progress.  According to EMS, patient complained of shortness of breath yesterday and then had a witnessed arrest today.  Patient has had over 1 hour of CPR in the field and prior to transport.  On arrival, patient has had approximately 100 minutes of CPR with 19 doses of epinephrine.  Patient also received magnesium, Solu-Medrol, and fluids.  Patient Has Had the itching end-tidal CO2 throughout.  He was intubated in the field without difficulty.  Patient has had some occasional twitching but has not had any meaningful movement and now has fixed and dilated pupils on arrival.  On my exam, airway does appear to be secured with intubation.  Patient has breath sounds are equal bilaterally, doubt tension pneumothorax.  Patient is not respond to any pain.  No gag reflex or blink reflex.  Patient has fixed and dilated pupils.  He is very pale and GCS is 3.  Intubating tube appears to be in correct position given good breath sounds bilaterally and no breath sounds on the abdomen.  CPR was continued after arrival for several rounds.  He had 2 more doses of epinephrine and his end-tidal continued to diminish.  After approximately 150 minutes of CPR, decision was made to discontinue resuscitative efforts.  Patient remained in PEA arrest with fixed and dilated pupils.  Time of death was declared at 11:13 AM.  PCP information was provided by EMS and they will be called to discuss death certificate.  Family is reportedly on the way to the hospital, will inform them of the passing when they arrive.  Coronavirus swab was sent during CPR, anticipate following up on the results.  12:36 PM The patient's wife arrived to the hospital and I told both her, their son, and the patient's uncle that he died.  We will try to reach back out again to the PCP to discuss to certificate.  Covid test is negative.  After multiple calls, I was unable to get a hold of the patient's PCP.  I filled out the death certificate myself.   Family was able to come to the bedside to be with patient prior to leaving the emergency department.   Final Clinical Impressions(s) / ED Diagnoses   Final diagnoses:  Cardiac arrest (HCC)     Clinical Impression: 1. Cardiac arrest [facility](HCC)     Disposition: Expired  This note was prepared with assistance of Dragon voice recognition software. Occasional wrong-word or sound-a-like substitutions may have occurred due to the inherent limitations of voice recognition software.     Brianny Soulliere, Canary Brimhristopher J, MD 09/20/2019 1626    Maycie Luera, Canary Brimhristopher J, MD 09/03/2019 1626

## 2019-09-09 NOTE — ED Notes (Signed)
Pulse check, no pulses 

## 2019-09-09 NOTE — ED Triage Notes (Signed)
Pt arrives via EMS with CPR in progress. 19 epi given, 100 mins of cpr after witnessed arrest. Has been feeling sob since yesterday then collapsed in front of sister this morning. Et tube placed, suctioned secretions from lungs.

## 2019-09-09 NOTE — ED Notes (Signed)
Paged the ME per Dr. Sherry Ruffing

## 2019-09-13 ENCOUNTER — Ambulatory Visit: Payer: Medicaid Other | Admitting: Internal Medicine

## 2019-09-14 ENCOUNTER — Ambulatory Visit (INDEPENDENT_AMBULATORY_CARE_PROVIDER_SITE_OTHER): Payer: Medicaid Other | Admitting: Primary Care

## 2019-09-20 ENCOUNTER — Ambulatory Visit: Payer: Medicaid Other | Admitting: Internal Medicine

## 2019-09-21 ENCOUNTER — Ambulatory Visit (HOSPITAL_COMMUNITY): Payer: Medicaid Other

## 2019-10-02 ENCOUNTER — Ambulatory Visit: Payer: Medicaid Other | Admitting: Internal Medicine

## 2019-10-03 DEATH — deceased

## 2019-10-04 ENCOUNTER — Other Ambulatory Visit (HOSPITAL_COMMUNITY): Payer: Medicaid Other

## 2019-11-02 ENCOUNTER — Encounter (HOSPITAL_COMMUNITY): Payer: Medicaid Other | Admitting: Cardiology

## 2019-11-29 ENCOUNTER — Encounter: Payer: Medicaid Other | Admitting: Student in an Organized Health Care Education/Training Program

## 2021-01-16 IMAGING — CT CT ANGIO CHEST
2 of 6 series · 18 of 46 positions shown · IV contrast (omnipaque)
Comparison: High-resolution chest CT dated August 17, 2017.

CLINICAL DATA: Worsening acute on chronic respiratory failure.
History of rheumatoid arthritis related interstitial lung disease.

EXAM:
CT ANGIOGRAPHY CHEST WITH CONTRAST
TECHNIQUE: Multidetector CT imaging of the chest was performed using the
standard protocol during bolus administration of intravenous
contrast. Multiplanar CT image reconstructions and MIPs were
obtained to evaluate the vascular anatomy.
CONTRAST:  100mL OMNIPAQUE IOHEXOL 350 MG/ML SOLN

[Series 15: thins · axial · 0.85mm/px · z∈[+1189,+1492]mm · 15 of 333 slices shown]
[im 15/333  lung]
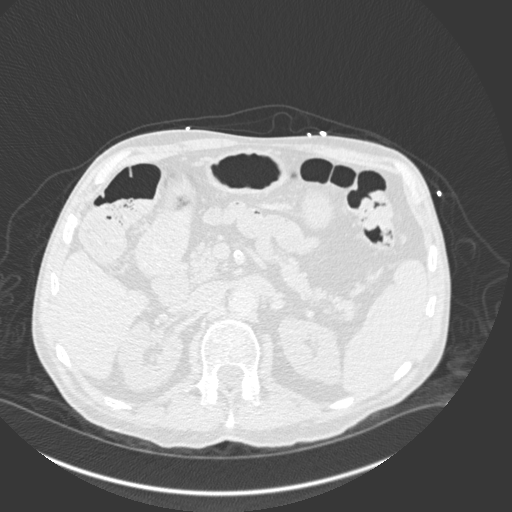
[im 44/333  soft-tissue]
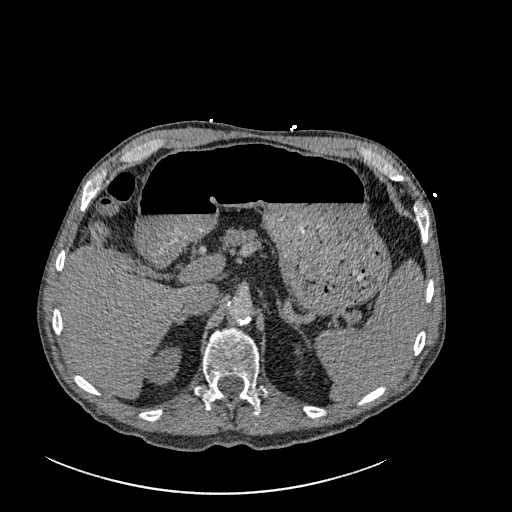
[im 58/333  lung]
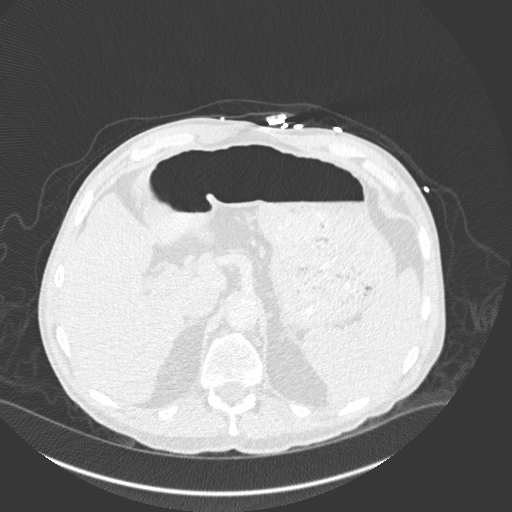
[im 87/333  soft-tissue]
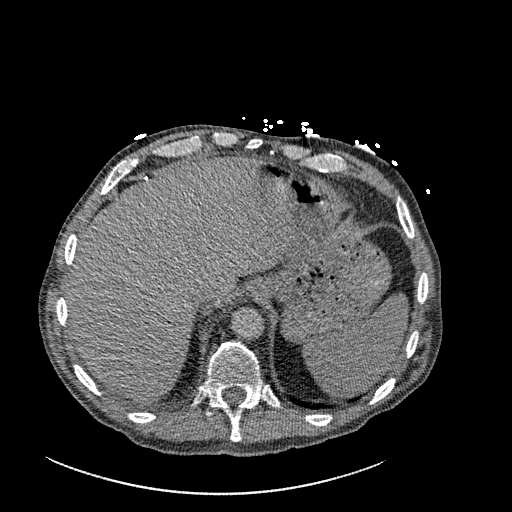
[im 102/333  lung]
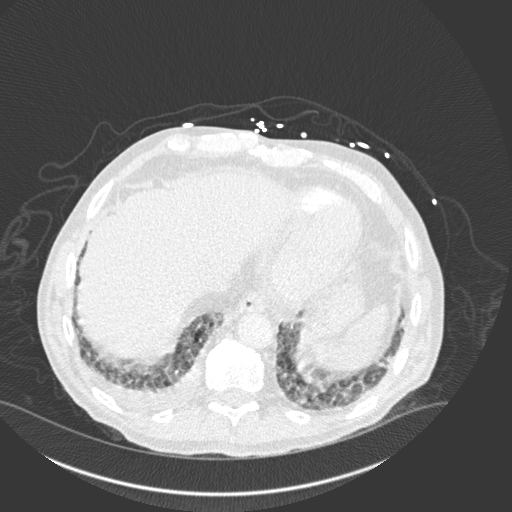
[im 130/333  soft-tissue]
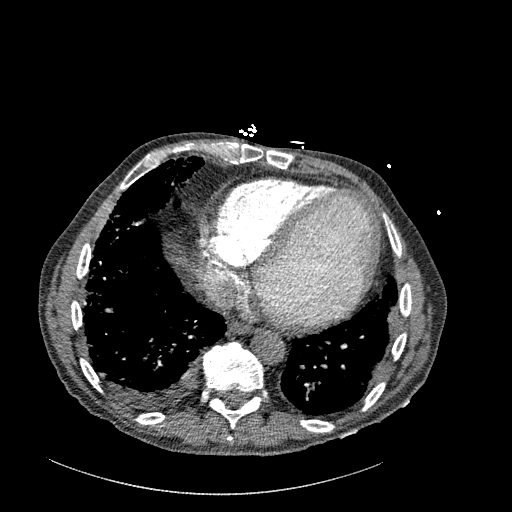
[im 145/333  lung]
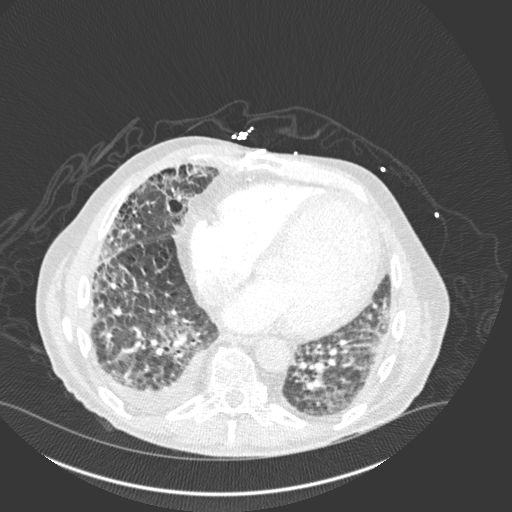
[im 174/333  soft-tissue]
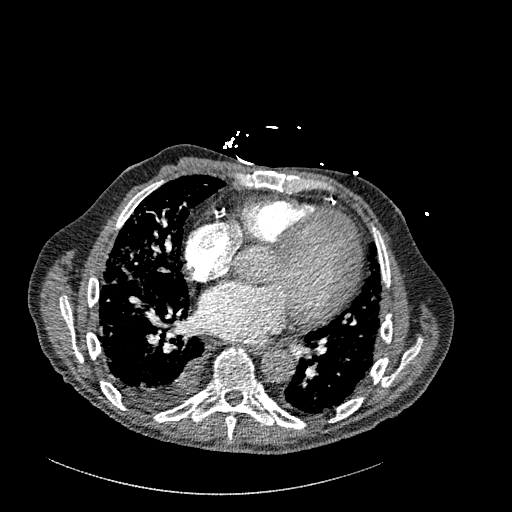
[im 188/333  lung]
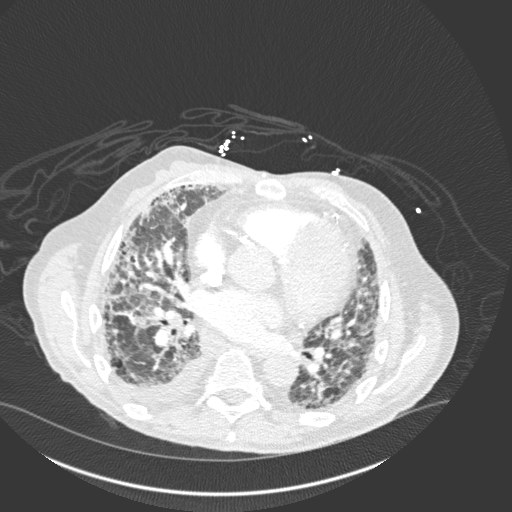
[im 203/333  soft-tissue]
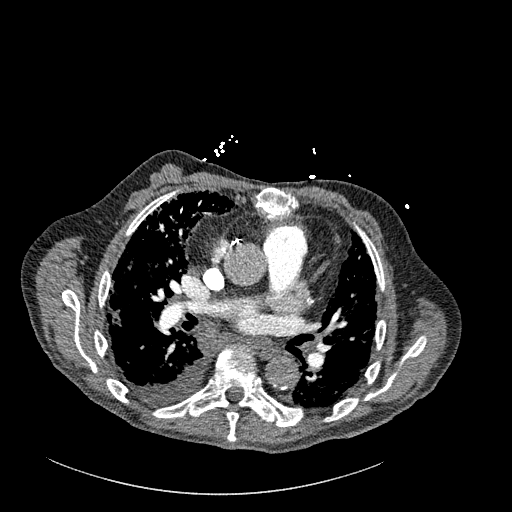
[im 231/333  lung]
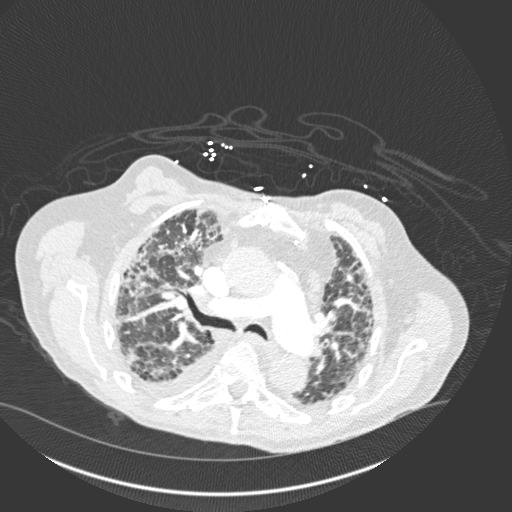
[im 246/333  soft-tissue]
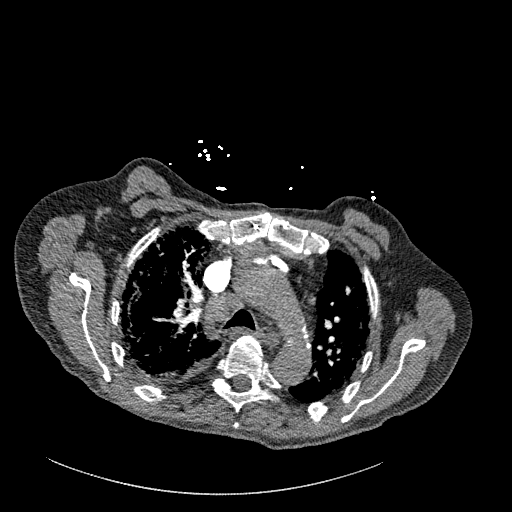
[im 275/333  lung]
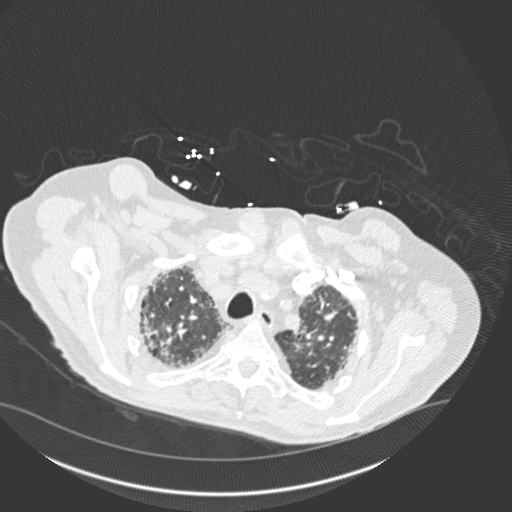
[im 289/333  soft-tissue]
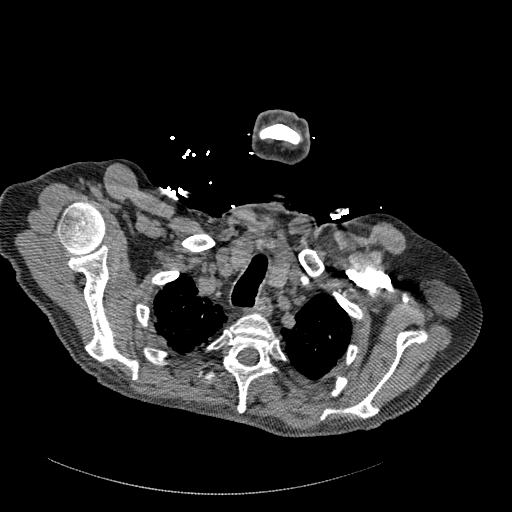
[im 318/333  lung]
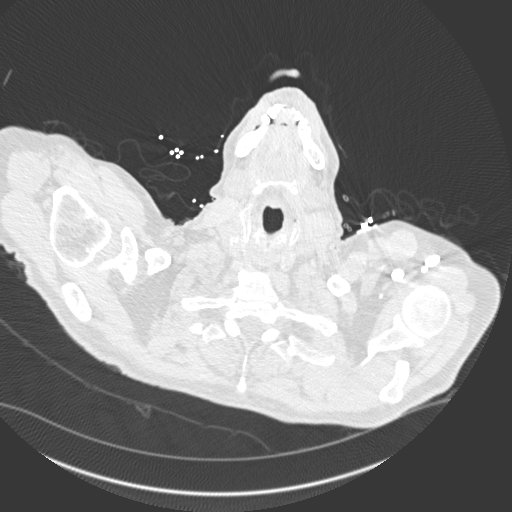

[Series 17: coronal mpr · coronal · 0.65mm/px · 3 of 151 slices shown]
[im 38/151  soft-tissue]
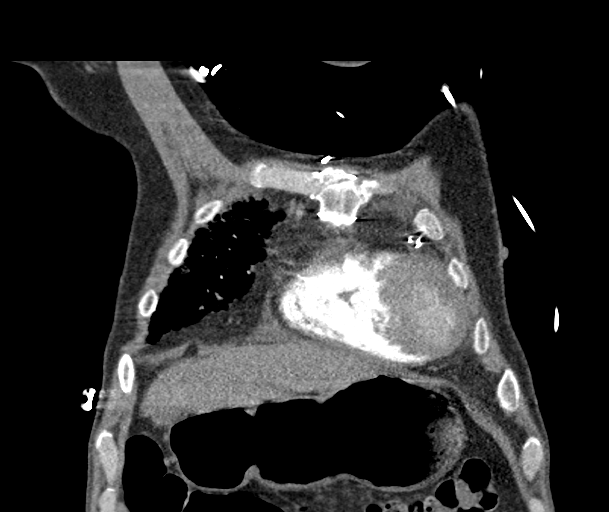
[im 76/151  soft-tissue]
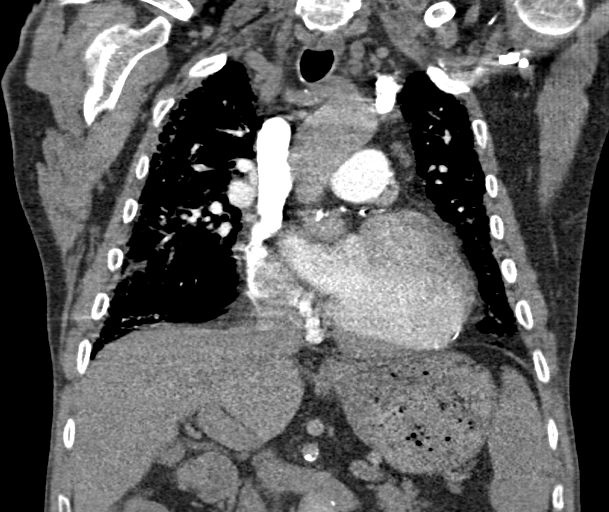
[im 113/151  soft-tissue]
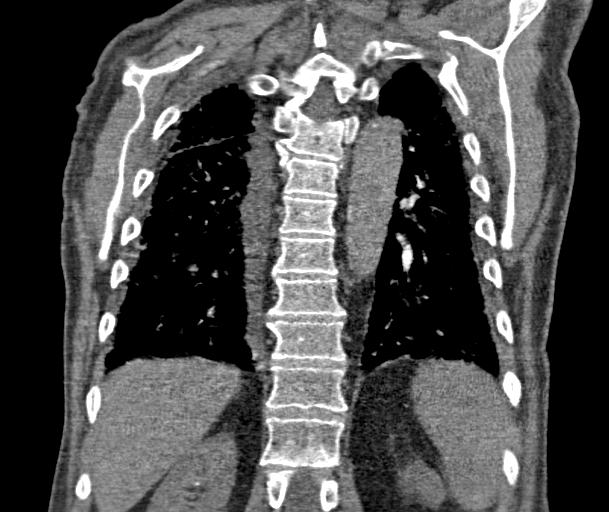

[18 of 46 positions shown; findings below may reference images not displayed]

FINDINGS: Cardiovascular: Satisfactory opacification of the pulmonary arteries
to the segmental level. No evidence of pulmonary embolism. Unchanged
moderate cardiomegaly. No pericardial effusion. No thoracic aortic
aneurysm. Coronary, aortic arch, and branch vessel atherosclerotic
vascular disease. Prior CABG.

Mediastinum/Nodes: Multiple prominent subcentimeter and borderline
enlarged mediastinal and hilar lymph nodes are similar to prior
study and likely reactive. No enlarged axillary lymph nodes. The
thyroid gland, trachea, and esophagus demonstrate no significant
findings.

Lungs/Pleura: New small right pleural effusion. Patchy areas of
ground-glass density, septal thickening, subpleural reticulation,
mild bronchiectasis and bronchiolectasis, and mild honeycombing are
again noted, with slightly more prominent interlobular septal
thickening and hazy ground-glass density when compared to prior
study from August 2017. No consolidation or pneumothorax.

Upper Abdomen: No acute abnormality. Punctate right nephrolithiasis.

Musculoskeletal: No chest wall abnormality. No acute or significant
osseous findings.

Review of the MIP images confirms the above findings.
IMPRESSION: 1.  No evidence of pulmonary embolism.
2. Chronic interstitial lung disease. Slightly more prominent
interlobular septal thickening and hazy ground-glass density
throughout both lungs when compared to the prior study may reflect a
degree of pulmonary edema given new small right pleural effusion.
3.  Aortic atherosclerosis ([id number]-YY0.0).
